# Patient Record
Sex: Female | Born: 1935
Health system: Southern US, Community
[De-identification: ages and names within clinical notes are randomized; demographics above are authoritative.]

## PROBLEM LIST (undated history)

## (undated) DIAGNOSIS — I10 Essential (primary) hypertension: Secondary | ICD-10-CM

## (undated) DIAGNOSIS — M792 Neuralgia and neuritis, unspecified: Secondary | ICD-10-CM

## (undated) DIAGNOSIS — K219 Gastro-esophageal reflux disease without esophagitis: Secondary | ICD-10-CM

## (undated) DIAGNOSIS — I5030 Unspecified diastolic (congestive) heart failure: Secondary | ICD-10-CM

## (undated) DIAGNOSIS — Z9289 Personal history of other medical treatment: Secondary | ICD-10-CM

## (undated) DIAGNOSIS — K2270 Barrett's esophagus without dysplasia: Secondary | ICD-10-CM

## (undated) DIAGNOSIS — I4819 Other persistent atrial fibrillation: Secondary | ICD-10-CM

## (undated) DIAGNOSIS — K859 Acute pancreatitis without necrosis or infection, unspecified: Secondary | ICD-10-CM

## (undated) DIAGNOSIS — E039 Hypothyroidism, unspecified: Secondary | ICD-10-CM

## (undated) DIAGNOSIS — E785 Hyperlipidemia, unspecified: Secondary | ICD-10-CM

## (undated) DIAGNOSIS — F329 Major depressive disorder, single episode, unspecified: Secondary | ICD-10-CM

## (undated) DIAGNOSIS — I872 Venous insufficiency (chronic) (peripheral): Secondary | ICD-10-CM

## (undated) DIAGNOSIS — N184 Chronic kidney disease, stage 4 (severe): Secondary | ICD-10-CM

## (undated) DIAGNOSIS — K831 Obstruction of bile duct: Secondary | ICD-10-CM

## (undated) DIAGNOSIS — I340 Nonrheumatic mitral (valve) insufficiency: Secondary | ICD-10-CM

## (undated) DIAGNOSIS — K5790 Diverticulosis of intestine, part unspecified, without perforation or abscess without bleeding: Secondary | ICD-10-CM

## (undated) DIAGNOSIS — F32A Depression, unspecified: Secondary | ICD-10-CM

## (undated) HISTORY — DX: Chronic kidney disease, stage 4 (severe): N18.4

## (undated) HISTORY — PX: CATARACT EXTRACTION: SUR2

## (undated) HISTORY — DX: Barrett's esophagus without dysplasia: K22.70

## (undated) HISTORY — DX: Major depressive disorder, single episode, unspecified: F32.9

## (undated) HISTORY — DX: Venous insufficiency (chronic) (peripheral): I87.2

## (undated) HISTORY — PX: VESICOVAGINAL FISTULA CLOSURE W/ TAH: SUR271

## (undated) HISTORY — PX: TONSILLECTOMY: SUR1361

## (undated) HISTORY — DX: Neuralgia and neuritis, unspecified: M79.2

## (undated) HISTORY — DX: Essential (primary) hypertension: I10

## (undated) HISTORY — PX: ABDOMINAL HYSTERECTOMY: SHX81

## (undated) HISTORY — DX: Obstruction of bile duct: K83.1

## (undated) HISTORY — DX: Nonrheumatic mitral (valve) insufficiency: I34.0

## (undated) HISTORY — PX: VENTRAL HERNIA REPAIR: SHX424

## (undated) HISTORY — DX: Depression, unspecified: F32.A

## (undated) HISTORY — DX: Acute pancreatitis without necrosis or infection, unspecified: K85.90

## (undated) HISTORY — DX: Diverticulosis of intestine, part unspecified, without perforation or abscess without bleeding: K57.90

## (undated) HISTORY — DX: Hypothyroidism, unspecified: E03.9

## (undated) HISTORY — DX: Gastro-esophageal reflux disease without esophagitis: K21.9

## (undated) HISTORY — DX: Hyperlipidemia, unspecified: E78.5

## (undated) HISTORY — DX: Other persistent atrial fibrillation: I48.19

---

## 2004-11-24 ENCOUNTER — Ambulatory Visit: Payer: Self-pay | Admitting: Unknown Physician Specialty

## 2005-03-30 ENCOUNTER — Ambulatory Visit: Payer: Self-pay | Admitting: Unknown Physician Specialty

## 2005-08-08 HISTORY — PX: VENTRAL HERNIA REPAIR: SHX424

## 2005-10-28 ENCOUNTER — Ambulatory Visit: Payer: Self-pay | Admitting: Unknown Physician Specialty

## 2005-11-03 ENCOUNTER — Ambulatory Visit: Payer: Self-pay | Admitting: Unknown Physician Specialty

## 2006-05-08 ENCOUNTER — Ambulatory Visit: Payer: Self-pay | Admitting: Unknown Physician Specialty

## 2006-05-15 ENCOUNTER — Ambulatory Visit: Payer: Self-pay | Admitting: Surgery

## 2006-05-22 ENCOUNTER — Ambulatory Visit: Payer: Self-pay | Admitting: Surgery

## 2006-08-08 HISTORY — PX: CATARACT EXTRACTION: SUR2

## 2007-03-01 ENCOUNTER — Ambulatory Visit: Payer: Self-pay | Admitting: Unknown Physician Specialty

## 2007-07-23 ENCOUNTER — Other Ambulatory Visit: Payer: Self-pay

## 2007-07-23 ENCOUNTER — Ambulatory Visit: Payer: Self-pay | Admitting: Ophthalmology

## 2007-08-06 ENCOUNTER — Ambulatory Visit: Payer: Self-pay | Admitting: Ophthalmology

## 2007-09-18 ENCOUNTER — Ambulatory Visit: Payer: Self-pay | Admitting: Unknown Physician Specialty

## 2008-05-28 ENCOUNTER — Ambulatory Visit: Payer: Self-pay | Admitting: Unknown Physician Specialty

## 2008-08-08 HISTORY — PX: CHOLECYSTECTOMY: SHX55

## 2008-09-08 HISTORY — PX: CHOLECYSTECTOMY: SHX55

## 2008-09-30 ENCOUNTER — Inpatient Hospital Stay: Payer: Self-pay | Admitting: Surgery

## 2008-10-06 DIAGNOSIS — K831 Obstruction of bile duct: Secondary | ICD-10-CM

## 2008-10-06 HISTORY — DX: Obstruction of bile duct: K83.1

## 2008-11-11 ENCOUNTER — Ambulatory Visit: Payer: Self-pay | Admitting: Unknown Physician Specialty

## 2009-12-08 ENCOUNTER — Ambulatory Visit: Payer: Self-pay | Admitting: Unknown Physician Specialty

## 2010-11-04 ENCOUNTER — Encounter: Payer: Self-pay | Admitting: Internal Medicine

## 2010-11-04 DIAGNOSIS — K219 Gastro-esophageal reflux disease without esophagitis: Secondary | ICD-10-CM | POA: Insufficient documentation

## 2010-11-04 DIAGNOSIS — J45901 Unspecified asthma with (acute) exacerbation: Secondary | ICD-10-CM | POA: Insufficient documentation

## 2010-11-04 DIAGNOSIS — R0602 Shortness of breath: Secondary | ICD-10-CM

## 2010-11-04 DIAGNOSIS — I10 Essential (primary) hypertension: Secondary | ICD-10-CM | POA: Insufficient documentation

## 2010-11-05 ENCOUNTER — Encounter: Payer: Self-pay | Admitting: Internal Medicine

## 2010-11-05 ENCOUNTER — Ambulatory Visit (INDEPENDENT_AMBULATORY_CARE_PROVIDER_SITE_OTHER): Payer: Medicare Other | Admitting: Internal Medicine

## 2010-11-05 ENCOUNTER — Institutional Professional Consult (permissible substitution): Payer: Self-pay | Admitting: Internal Medicine

## 2010-11-05 DIAGNOSIS — N184 Chronic kidney disease, stage 4 (severe): Secondary | ICD-10-CM | POA: Insufficient documentation

## 2010-11-05 DIAGNOSIS — F321 Major depressive disorder, single episode, moderate: Secondary | ICD-10-CM | POA: Insufficient documentation

## 2010-11-05 DIAGNOSIS — K219 Gastro-esophageal reflux disease without esophagitis: Secondary | ICD-10-CM

## 2010-11-05 DIAGNOSIS — I872 Venous insufficiency (chronic) (peripheral): Secondary | ICD-10-CM | POA: Insufficient documentation

## 2010-11-05 DIAGNOSIS — I5022 Chronic systolic (congestive) heart failure: Secondary | ICD-10-CM

## 2010-11-05 DIAGNOSIS — K2270 Barrett's esophagus without dysplasia: Secondary | ICD-10-CM | POA: Insufficient documentation

## 2010-11-05 DIAGNOSIS — F329 Major depressive disorder, single episode, unspecified: Secondary | ICD-10-CM

## 2010-11-05 DIAGNOSIS — J45909 Unspecified asthma, uncomplicated: Secondary | ICD-10-CM

## 2010-11-05 DIAGNOSIS — N289 Disorder of kidney and ureter, unspecified: Secondary | ICD-10-CM

## 2010-11-05 DIAGNOSIS — J42 Unspecified chronic bronchitis: Secondary | ICD-10-CM | POA: Insufficient documentation

## 2010-11-05 DIAGNOSIS — I10 Essential (primary) hypertension: Secondary | ICD-10-CM | POA: Insufficient documentation

## 2010-11-05 MED ORDER — NEBIVOLOL HCL 20 MG PO TABS: 1.0000 | ORAL_TABLET | Freq: Every day | ORAL | Status: DC

## 2010-11-05 MED ORDER — MOMETASONE FURO-FORMOTEROL FUM 100-5 MCG/ACT IN AERO: 2.0000 | INHALATION_SPRAY | Freq: Two times a day (BID) | RESPIRATORY_TRACT | Status: DC

## 2010-11-05 NOTE — Progress Notes (Signed)
  Subjective:    Patient ID: Savannah Wilson, female    DOB: [birth year], 77 y.o.   MRN: [id number]  HPI  31 yowf never smoked with new onset breathing problems dating to 2010 at baseline walk flat nl paces for 30 min at a home  11/05/2010 ov cc recurrent bronchitis x sev decades,  Assoc with difficulty breathing and transient need for inhalers then Dec 2011 on trip to [facility] by bus developed persistent doe at > slow adls so started on advair and proaire with only a little improvement,  Assoc with hoarseness, mostly dry cough.  Has chronic leg swelling no correlation with cough.  proaire works best but hfa < 10% in terms of technique .  Mild assoc nasal congestion, sense of pnd but no sign sputum production or rhinorhea.   Pt denies any significant sore throat, dysphagia, itching, sneezing,  nasal congestion or excess/ purulent secretions,  fever, chills, sweats, unintended wt loss, pleuritic or exertional cp, hempoptysis, orthopnea pnd .  Has leg swelling but no correlation with sob  Also denies any obvious fluctuation of symptoms with weather or environmental changes or other aggravating or alleviating factors.     Review of Systems  Constitutional: Negative for fever, chills and unexpected weight change.  HENT: Positive for congestion, sneezing and postnasal drip. Negative for ear pain, nosebleeds, sore throat, rhinorrhea, trouble swallowing, dental problem, voice change and sinus pressure.   Eyes: Negative for visual disturbance.  Respiratory: Positive for cough and shortness of breath. Negative for choking.   Cardiovascular: Positive for leg swelling. Negative for chest pain.  Gastrointestinal: Negative for vomiting, abdominal pain and diarrhea.  Genitourinary: Negative for difficulty urinating.  Musculoskeletal: Negative for arthralgias.  Skin: Negative for rash.  Neurological: Negative for tremors, syncope and headaches.  Hematological: Bruises/bleeds easily.       Objective:   Physical Exam    Pleasant amb wf nad  Wt 185  11/05/10  HEENT: nl dentition, turbinates, and orophanx. Nl external ear canals without cough reflex   NECK :  without JVD/Nodes/TM/ nl carotid upstrokes bilaterally   LUNGS: no acc muscle use, clear to A and P bilaterally without cough on insp or exp maneuvers   CV:  RRR  no s3 or murmur or increase in P2, no edema   ABD:  soft and nontender with nl excursion in the supine position. No bruits or organomegaly, bowel sounds nl  MS:  warm without deformities, calf tenderness, cyanosis or clubbing  SKIN: warm and dry without lesions    NEURO:  alert, approp, no deficits      Assessment & Plan:

## 2010-11-05 NOTE — Patient Instructions (Signed)
Stop advair and metaprolol  Start dulera 100  Take 2 puffs first thing in am and then another 2 puffs about 12 hours later.     Start  Bystolic 20 mg one each am (instead of the place of two metaprolol)  Protonix Take 30- 60 min before your first and last meals of the day   Work on inhaler technique:  relax and gently blow all the way out then take a nice smooth deep breath back in, triggering the inhaler at same time you start breathing in.  Hold for up to 5 seconds if you can.  Rinse and gargle with water when done   If your mouth or throat starts to bother you,   I suggest you time the inhaler to your dental care and after using the inhaler(s)    brush teeth and tongue with a baking soda containing toothpaste and when you rinse this out, gargle with it first to see if this helps your mouth and throat.     GERD (REFLUX)  is an extremely common cause of respiratory symptoms, many times with no significant heartburn at all.    It can be treated with medication, but also with lifestyle changes including avoidance of late meals, excessive alcohol, smoking cessation, and avoid fatty foods, chocolate, peppermint, colas, red wine, and acidic juices such as orange juice.  NO MINT OR MENTHOL PRODUCTS SO NO COUGH DROPS  USE SUGARLESS CANDY INSTEAD (jolley ranchers or Stover's)  NO OIL BASED VITAMINS   Please schedule a follow up office visit in 4 weeks, sooner if needed

## 2010-11-06 DIAGNOSIS — I5022 Chronic systolic (congestive) heart failure: Secondary | ICD-10-CM | POA: Insufficient documentation

## 2010-11-06 NOTE — Assessment & Plan Note (Addendum)
The differential diagnosis of difficult to control airways disorders is extensive with no quick and easy answers but easy to remember because it consists of 11 A's,  Two Bs and one C: 1. In this case Adherence is the biggest issue and starts with  inability to use HFA effectively and also  understand that SABA treats the symptoms but doesn't get to the underlying problem (inflammation).  I used  the analogy of putting steroid cream on a rash to help explain the meaning of topical therapy and the need to get the drug to the target tissue.  The proper method of use, as well as anticipated side effects, of this metered-dose inhaler are discussed and demonstrated to the patient. Improved to 25% only. 2. Acid reflux disease, with the greater proportion of pulmonary patients with no overt heartburn symptoms, and no easy way to treat non-acid reflux 3. Ace inhibitor use,  >not relevant here  4. Active sinus dz, best addressed by a sinus ct 5. Active smoking,  >not relevant here  6. Allergic diseases, usually with a hx dating back to childhood with prominent allergic rhinitis features in up 90% of pts 7. Aspiration, a perennial problem in the elderly or other patients at risk 8. 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 Alpha one Antitrypsin deficiency, a must screen in patients with chronic airflow obstruction syndromes out of proportion to smoking history. 10. Adverse effect of inhalers, especially DPI's and especially with poor inhaler technique  > def a concern here, try off 11 Anxiety, always a diagnosis of exclusion Two B's 1. Bronchiectasis:  Pos CT is the sine que non here 2  Beta blocker effects:  Coreg and Timolol use are pervasive in the adult population and both have significant spillover effects on the airways. Even metaprolol in high doses can destabilize the airways so try change for now to bystolic (see CHF) One C 1. Congestive heart failure, nicely  ruled out now with BNP level of < 100 when symptomatic - see sep problem  For now work on inhaler technique and rx with ppi/ diet.

## 2010-11-06 NOTE — Assessment & Plan Note (Addendum)
Already on rx with pitting edema noted today but no crackles on inspiration, so ok to continue to titrate lasix as per previous instructions   Strongly prefer in this setting: Bystolic, the most beta -1  selective Beta blocker available in sample form, with bisoprolol the most selective generic choice  on the market, at least until establish baseline function and inhaler depedence (none chronically prior to Dec 2011)

## 2010-12-01 ENCOUNTER — Encounter: Payer: Self-pay | Admitting: Internal Medicine

## 2010-12-03 ENCOUNTER — Ambulatory Visit (INDEPENDENT_AMBULATORY_CARE_PROVIDER_SITE_OTHER): Payer: Medicare Other | Admitting: Internal Medicine

## 2010-12-03 ENCOUNTER — Encounter: Payer: Self-pay | Admitting: Internal Medicine

## 2010-12-03 DIAGNOSIS — J45909 Unspecified asthma, uncomplicated: Secondary | ICD-10-CM

## 2010-12-03 NOTE — Patient Instructions (Addendum)
Start dulera 100  Take 2 puffs first thing in am and then another 2 puffs about 12 hours later.    Protonix Pantoprazole) Take 30- 60 min before your first and last meals of the day   Work on perfecting inhaler technique:  relax and gently blow all the way out then take a nice smooth deep breath back in, triggering the inhaler at same time you start breathing in.  Hold for up to 5 seconds if you can.  Rinse and gargle with water when done   If your mouth or throat starts to bother you,   I suggest you time the inhaler to your dental care and after using the inhaler(s)    brush teeth and tongue with a baking soda containing toothpaste and when you rinse this out, gargle with it first to see if this helps your mouth and throat.     GERD (REFLUX)  is an extremely common cause of respiratory symptoms, many times with no significant heartburn at all.    It can be treated with medication, but also with lifestyle changes including avoidance of late meals, excessive alcohol, smoking cessation, and avoid fatty foods, chocolate, peppermint, colas, red wine, and acidic juices such as orange juice.  NO MINT OR MENTHOL PRODUCTS SO NO COUGH DROPS  USE SUGARLESS CANDY INSTEAD (jolley ranchers or Stover's)  NO OIL BASED VITAMINS   I don't believe your blood pressure pills are affecting your breathing so all blood pressure medications per Dr Gorden Harms  Please schedule a follow up office visit in 4 weeks, sooner if needed with PFT's on return   Think of your medications in 2 separate categories and keep them separate at home:    1)  The ones you take no matter what daily on a scheduled basis.   These are the ones you put in pill boxes and take until a doctor adjusts them for you.  They are designed to help prevent problems or treat the cause of the problem.  They won't necessarily  make you feel immediately better so it's not possible for you to adjust them on your own.  2)  The ones you only take if needed  for specific problems, stopping them once you feel better but restarting them if your problem returns.    Unlike when you get a prescription for eyeglasses, it's not possible to always walk out of this or any medical office with a perfect prescription that is immediately effective  based on any test that we offer here.    On the contrary, it may take several weeks for the full impact of changes recommened today - hopefully you will respond well.  If not, then we'll adjust your medication on your next visit accordingly, knowing more then than we can possibly know now.

## 2010-12-03 NOTE — Assessment & Plan Note (Addendum)
DDX of  difficult airways managment all start with A and  include Adherence, Ace Inhibitors, Acid Reflux, Active Sinus Disease, Alpha 1 Antitripsin deficiency, Anxiety masquerading as Airways dz,  ABPA,  allergy(esp in young), Aspiration (esp in elderly), Adverse effects of DPI,  Active smokers, plus two Bs  = Bronchiectasis and Beta blocker use..and one C= CHF  In this case Adherence is the biggest issue and starts with  inability to use HFA effectively and also  understand that SABA treats the symptoms but doesn't get to the underlying problem (inflammation).  I used  the analogy of putting steroid cream on a rash to help explain the meaning of topical therapy and the need to get the drug to the target tissue.     Acid reflux may well have been an issue because the  ramp to expected improvement in symptoms from an empiric trial of PPI (and for that matter, worsening, if a chronic effective medication is stopped)  can be measured in weeks, not days, a common misconception because this is not the same as treating heartburn (no immediate cause and effect relationship)  so that response to therapy or lack thereof can be very difficult to assess especially if the patient is not adherent to the treatment plan which includes dietary restrictions.   ? Adverse effects of dpi > ok off advair  but needs f/u pft's since still doe.  The spillover B2 effects of metaprolol must not have been an issue with no improvement p change to bystolic which did not control her bp as well, at least in the dose started.     CHF ? Related to hbp/ cri may be a component here     Each maintenance medication was reviewed in detail including most importantly the difference between maintenance and as needed and under what circumstances the prns are to be used.  Please see instructions for details which were reviewed in writing and the patient given a copy.  See instructions for specific recommendations which were reviewed directly  with the patient who was given a copy with highlighter outlining the key components.

## 2010-12-03 NOTE — Progress Notes (Signed)
  Subjective:    Patient ID: Savannah Wilson, female    DOB: 02/16/1929, 77 y.o.   MRN: [id number]  HPI   Subjective:    Patient ID: Savannah Wilson, female    DOB: 02-20-1929, 77 y.o.   MRN: [id number]  HPI  86 yowf never smoked with new onset breathing problems dating to 2010 but at  baseline walk flat nl paces for 30 min at a home  11/05/2010 ov cc recurrent bronchitis x sev decades,  Assoc with difficulty breathing and transient need for inhalers then Dec 2011 on trip to [facility] by bus developed persistent doe at > slow adls so started on advair and proaire with only a little improvement,  Assoc with hoarseness, mostly dry cough.  Has chronic leg swelling no correlation with cough.  proaire works best but hfa < 10% in terms of technique .  Mild assoc nasal congestion, sense of pnd but no sign sputum production or rhinorhea.  rec change advair to dulera 100  And change metaprolol to bystolic in case of spillowver B2 effect, work on inhaler.   12/03/2010 ov/Zymire Turnbo cc "not better at all"  x 2 weeks p ov  then ha then checked bp 239/101 then [facility] where bystolic stopped restart the metaprolol and add back lasix and breathing got better but still not back to baseline but now able to ex for 20 min. No more cough.  Pt denies any significant sore throat, dysphagia, itching, sneezing,  nasal congestion or excess/ purulent secretions,  fever, chills, sweats, unintended wt loss, pleuritic or exertional cp, hempoptysis, orthopnea pnd or leg swelling.    Also denies any obvious fluctuation of symptoms with weather or environmental changes or other aggravating or alleviating factors.              Objective:   Physical Exam    Pleasant amb wf nad but easily frustrated with details of care    Wt 185  11/05/10  >  181 12/03/2010   HEENT: nl dentition, turbinates, and orophanx. Nl external ear canals without cough reflex   NECK :  without JVD/Nodes/TM/ nl carotid upstrokes  bilaterally   LUNGS: no acc muscle use, clear to A and P bilaterally without cough on insp or exp maneuvers   CV:  RRR  no s3 or murmur or increase in P2, no edema   ABD:  soft and nontender with nl excursion in the supine position. No bruits or organomegaly, bowel sounds nl  MS:  warm without deformities, calf tenderness, cyanosis or clubbing        Assessment & Plan:    Review of Systems     Objective:   Physical Exam        Assessment & Plan:

## 2010-12-06 ENCOUNTER — Ambulatory Visit: Payer: Self-pay | Admitting: Unknown Physician Specialty

## 2011-01-07 ENCOUNTER — Encounter: Payer: Self-pay | Admitting: Internal Medicine

## 2011-01-07 ENCOUNTER — Ambulatory Visit (INDEPENDENT_AMBULATORY_CARE_PROVIDER_SITE_OTHER): Payer: Medicare Other | Admitting: Internal Medicine

## 2011-01-07 DIAGNOSIS — J45909 Unspecified asthma, uncomplicated: Secondary | ICD-10-CM

## 2011-01-07 DIAGNOSIS — J42 Unspecified chronic bronchitis: Secondary | ICD-10-CM

## 2011-01-07 LAB — PULMONARY FUNCTION TEST

## 2011-01-07 MED ORDER — MOMETASONE FURO-FORMOTEROL FUM 100-5 MCG/ACT IN AERO: 2.0000 | INHALATION_SPRAY | Freq: Two times a day (BID) | RESPIRATORY_TRACT | Status: DC

## 2011-01-07 NOTE — Patient Instructions (Signed)
Continue dulera 100 Take 2 puffs first thing in am and then another 2 puffs about 12 hours later.    Work on inhaler technique:  relax and gently blow all the way out then take a nice smooth deep breath back in, triggering the inhaler at same time you start breathing in.  Hold for up to 5 seconds if you can.  Rinse and gargle with water when done   If your mouth or throat starts to bother you,   I suggest you time the inhaler to your dental care and after using the inhaler(s) brush teeth and tongue with a baking soda containing toothpaste and when you rinse this out, gargle with it first to see if this helps your mouth and throat.     Xopenex hfa = Proaire only use it as a rescue inhaler if you can't catch your breath    To get the most out of exercise, you need to be continuously aware that you are short of breath, but never out of breath, for 30 minutes daily. As you improve, it will actually be easier for you to do the same amount of exercise  in  30 minutes so always push to the level where you are short of breath. If doing a treadmill always increase the speed first then the incline to reach your goal of making it harder to breath but never too hard to catch your breath - good luck!   Please schedule a follow up visit in 3 months but call sooner if needed

## 2011-01-07 NOTE — Progress Notes (Signed)
PFT done today. 

## 2011-01-07 NOTE — Progress Notes (Deleted)
  Subjective:    Patient ID: Savannah Wilson, female    DOB: [birth year], 77 y.o.   MRN: [id number]  HPI   Subjective:    Patient ID: Savannah Wilson, female    DOB: 03-07-1929, 77 y.o.   MRN: [id number]  HPI  40 yowf never smoked with new onset breathing problems dating to 2010 but at  baseline walk flat nl paces for 30 min at a home  11/05/2010 ov cc recurrent bronchitis x sev decades,  Assoc with difficulty breathing and transient need for inhalers then Dec 2011 on trip to [facility] by bus developed persistent doe at > slow adls so started on advair and proaire with only a little improvement,  Assoc with hoarseness, mostly dry cough.  Has chronic leg swelling no correlation with cough.  proaire works best but hfa < 10% in terms of technique .  Mild assoc nasal congestion, sense of pnd but no sign sputum production or rhinorhea.  rec change advair to dulera 100  And change metaprolol to bystolic in case of spillowver B2 effect, work on inhaler.   12/03/2010 ov/Tollie Canada cc "not better at all"  x 2 weeks p ov  then ha then checked bp 239/101 then [facility] where bystolic stopped restart the metaprolol and add back lasix and breathing got better but still not back to baseline but now able to ex for 20 min. No more cough.  Pt denies any significant sore throat, dysphagia, itching, sneezing,  nasal congestion or excess/ purulent secretions,  fever, chills, sweats, unintended wt loss, pleuritic or exertional cp, hempoptysis, orthopnea pnd or leg swelling.    Also denies any obvious fluctuation of symptoms with weather or environmental changes or other aggravating or alleviating factors.              Objective:   Physical Exam    Pleasant amb wf nad but easily frustrated with details of care    Wt 185  11/05/10  >  181 12/03/2010   HEENT: nl dentition, turbinates, and orophanx. Nl external ear canals without cough reflex   NECK :  without JVD/Nodes/TM/ nl carotid upstrokes  bilaterally   LUNGS: no acc muscle use, clear to A and P bilaterally without cough on insp or exp maneuvers   CV:  RRR  no s3 or murmur or increase in P2, no edema   ABD:  soft and nontender with nl excursion in the supine position. No bruits or organomegaly, bowel sounds nl  MS:  warm without deformities, calf tenderness, cyanosis or clubbing        Assessment & Plan:    Review of Systems     Objective:   Physical Exam        Assessment & Plan:

## 2011-01-07 NOTE — Progress Notes (Signed)
  Subjective:    Patient ID: Savannah Wilson, female    DOB: 01/26/1929, 77 y.o.   MRN: [id number]  HPI   42 yowf never smoked with new onset breathing problems dating to 2010 but at baseline walk flat nl paces for 30 min at a home   11/05/2010 ov cc recurrent bronchitis x sev decades, Assoc with difficulty breathing and transient need for inhalers then Dec 2011 on trip to [facility] by bus developed persistent doe at > slow adls so started on advair and proaire with only a little improvement, Assoc with hoarseness, mostly dry cough. Has chronic leg swelling no correlation with cough. proaire works best but hfa < 10% in terms of technique . Mild assoc nasal congestion, sense of pnd but no sign sputum production or rhinorhea. rec change advair to dulera 100 And change metaprolol to bystolic in case of spillover B2 effect, work on inhaler technique  12/03/2010 ov/Savannah Wilson cc "not better at all" x 2 weeks p ov then ha then checked bp 239/101 then [facility] where bystolic stopped restart the metaprolol and add back lasix and breathing got better but still not back to baseline but now able to ex for 20 min. No more cough.  rec trial of dulera 100 and max gerd diet/ ppi, ok to leave on metaprolol   01/07/2011 ov/Savannah Wilson all smiles  Cc only sob with steps,  Sleeping ok without nocturnal  or early am exac of resp c/o's or need for noct saba.  No cough. Pt denies any significant sore throat, dysphagia, itching, sneezing,  nasal congestion or excess/ purulent secretions,  fever, chills, sweats, unintended wt loss, pleuritic or exertional cp, hempoptysis, orthopnea pnd or leg swelling.    Also denies any obvious fluctuation of symptoms with weather or environmental changes or other aggravating or alleviating factors.      Review of Systems     Objective:   Physical Exam    Pleasant amb wf nad but easily frustrated with details of care  Wt 185 11/05/10 > 181 12/03/2010  > 178 01/08/2011  HEENT: nl dentition,  turbinates, and orophanx. Nl external ear canals without cough reflex  NECK : without JVD/Nodes/TM/ nl carotid upstrokes bilaterally  LUNGS: no acc muscle use, clear to A and P bilaterally without cough on insp or exp maneuvers  CV: RRR no s3 or murmur or increase in P2, no edema  ABD: soft and nontender with nl excursion in the supine position. No bruits or organomegaly, bowel sounds nl  MS: warm without deformities, calf tenderness, cyanosis or clubbing     Assessment & Plan:

## 2011-01-08 NOTE — Assessment & Plan Note (Signed)
All goals of chronic asthma control met including optimal function and elimination of symptoms with minimal need for rescue therapy.  Contingencies discussed in full including contacting this office immediately if not controlling the symptoms using the rule of two's.     The proper method of use, as well as anticipated side effects, of this metered-dose inhaler are discussed and demonstrated to the patient. This improved to 75% with coaching  She still has doe but strongly suspect this is due to deconditioning based on her pft's today which are essentially normal.  See instructions for specific recommendations which were reviewed directly with the patient who was given a copy with highlighter outlining the key components.

## 2011-01-21 ENCOUNTER — Encounter: Payer: Self-pay | Admitting: Internal Medicine

## 2011-03-02 ENCOUNTER — Ambulatory Visit: Payer: Self-pay | Admitting: Ophthalmology

## 2011-03-02 ENCOUNTER — Ambulatory Visit: Payer: Self-pay | Admitting: Unknown Physician Specialty

## 2011-03-02 LAB — HM MAMMOGRAPHY: HM Mammogram: NORMAL

## 2011-03-14 ENCOUNTER — Ambulatory Visit: Payer: Self-pay | Admitting: Ophthalmology

## 2011-03-21 ENCOUNTER — Ambulatory Visit: Payer: Self-pay | Admitting: Internal Medicine

## 2011-04-06 ENCOUNTER — Ambulatory Visit: Payer: Medicare Other | Admitting: Internal Medicine

## 2011-04-09 ENCOUNTER — Ambulatory Visit: Payer: Self-pay | Admitting: Internal Medicine

## 2011-05-09 ENCOUNTER — Ambulatory Visit: Payer: Self-pay | Admitting: Internal Medicine

## 2011-05-30 ENCOUNTER — Encounter: Payer: Self-pay | Admitting: Internal Medicine

## 2011-08-09 HISTORY — PX: CARPAL TUNNEL RELEASE: SHX101

## 2011-08-12 ENCOUNTER — Ambulatory Visit: Payer: Self-pay | Admitting: Specialist

## 2011-08-12 DIAGNOSIS — I10 Essential (primary) hypertension: Secondary | ICD-10-CM

## 2011-08-12 LAB — POTASSIUM: Potassium: 4.8 mmol/L (ref 3.5–5.1)

## 2011-08-19 ENCOUNTER — Ambulatory Visit: Payer: Self-pay | Admitting: Specialist

## 2011-09-14 ENCOUNTER — Ambulatory Visit (INDEPENDENT_AMBULATORY_CARE_PROVIDER_SITE_OTHER): Payer: Medicare Other | Admitting: Internal Medicine

## 2011-09-14 ENCOUNTER — Encounter: Payer: Self-pay | Admitting: Internal Medicine

## 2011-09-14 DIAGNOSIS — G2581 Restless legs syndrome: Secondary | ICD-10-CM

## 2011-09-14 DIAGNOSIS — I129 Hypertensive chronic kidney disease with stage 1 through stage 4 chronic kidney disease, or unspecified chronic kidney disease: Secondary | ICD-10-CM

## 2011-09-14 DIAGNOSIS — K219 Gastro-esophageal reflux disease without esophagitis: Secondary | ICD-10-CM

## 2011-09-14 DIAGNOSIS — N289 Disorder of kidney and ureter, unspecified: Secondary | ICD-10-CM

## 2011-09-14 DIAGNOSIS — N189 Chronic kidney disease, unspecified: Secondary | ICD-10-CM

## 2011-09-14 DIAGNOSIS — I872 Venous insufficiency (chronic) (peripheral): Secondary | ICD-10-CM

## 2011-09-14 MED ORDER — FUROSEMIDE 40 MG PO TABS: ORAL_TABLET | ORAL | Status: DC

## 2011-09-14 MED ORDER — PRAMIPEXOLE DIHYDROCHLORIDE 0.125 MG PO TABS: 0.1250 mg | ORAL_TABLET | Freq: Every day | ORAL | Status: DC

## 2011-09-14 NOTE — Progress Notes (Signed)
Subjective:    Patient ID: Savannah Wilson, female    DOB: January 18, 1929, 78 y.o.   MRN: [id number]  HPI  Vistaril is a delightful 78 year old white female with a history of diabetes mellitus with peripheral neuropathy and chronic kidney disease stage IV, venous insufficiency hypertension hypothyroidism and hyperlipidemia who presents to establish care. She was referred by her daughter Marisa Cyphers and was previously managed by Dr. Sherald Barge.  Her chief complaint today is lower extremity edema. She was recently seen by Dr.Latiffe, her nephrologist who prescribed a higher dose of amlodipine for management of hypertension with goal 120/70. She was compression stockings on a daily basis in spite of using these however has fluid retention and is uncomfortable and painful. She has no history of venous ulcers.  She has a history of COPD secondary to asthma and has had several episodes of bronchitis this year . Her pulmonologist is Dr. Melvyn Novas.  She is currently asymptomatic.     Review of Systems  Constitutional: Negative for fever, chills and unexpected weight change.  HENT: Negative for hearing loss, ear pain, nosebleeds, congestion, sore throat, facial swelling, rhinorrhea, sneezing, mouth sores, trouble swallowing, neck pain, neck stiffness, voice change, postnasal drip, sinus pressure, tinnitus and ear discharge.   Eyes: Negative for pain, discharge, redness and visual disturbance.  Respiratory: Negative for cough, chest tightness, shortness of breath, wheezing and stridor.   Cardiovascular: Positive for leg swelling. Negative for chest pain and palpitations.  Musculoskeletal: Negative for myalgias and arthralgias.  Skin: Negative for color change and rash.  Neurological: Negative for dizziness, weakness, light-headedness and headaches.  Hematological: Negative for adenopathy.       Objective:   Physical Exam  Constitutional: She is oriented to person, place, and time. She appears well-developed  and well-nourished.  HENT:  Mouth/Throat: Oropharynx is clear and moist.  Eyes: EOM are normal. Pupils are equal, round, and reactive to light. No scleral icterus.  Neck: Normal range of motion. Neck supple. No JVD present. No thyromegaly present.  Cardiovascular: Normal rate, regular rhythm, normal heart sounds and intact distal pulses.   Pulmonary/Chest: Effort normal and breath sounds normal.  Abdominal: Soft. Bowel sounds are normal. She exhibits no mass. There is no tenderness.  Musculoskeletal: Normal range of motion.  Lymphadenopathy:    She has no cervical adenopathy.  Neurological: She is alert and oriented to person, place, and time.  Skin: Skin is warm and dry.  Psychiatric: She has a normal mood and affect.          Assessment & Plan:   Venous insufficiency She has chronic lower extremity edema managed with compression stockings. However her recent addition of amlodipine as aggravated her symptoms. She takes Lasix every other day 20 mg. Will try increasing her Lasix to 40 a day to 40 mg every other day and follow kidney function and electrolytes. Also recommend that she try elevating her legs as much as possible when she is home to encourage drainage. She has no venous ulcers.  GERD (gastroesophageal reflux disease) Not at goal of 120/70 despite maximal dose of amlodipine.  Hypertension, renal disease She is not at goal for management of renal disease despite use of for medications. She is now maxed amlodipine losartan and metoprolol. She also takes hydralazine 25 mg 3 times daily I have increased her Lasix today 40 mg every other day given her concurrent increased lower extremity edema. It may be worthwhile trying her on Micardis her Diovan instead of losartan if he's not  at goal. However Dr. Holley Raring is also managing her heard hypertension so I will defer to him at this time.  Renal insufficiency She has stage IV chronic kidney disease and is managed by Dr. Holley Raring her  creatinine has been stable and her creatinine clearance is around 30.  Restless legs syndrome She has been having difficulty sleeping due to 2 recurrent twitching and dysesthesias of her lower extremities. She does request a trial of medication for restless legs. I have sent a prescription for Mirapex generic  to her pharmacy    Updated Medication List Outpatient Encounter Prescriptions as of 09/14/2011  Medication Sig Dispense Refill  . acetaminophen (TYLENOL) 650 MG CR tablet Take 650 mg by mouth every 8 (eight) hours as needed.      Marland Kitchen albuterol (PROAIR HFA) 108 (90 BASE) MCG/ACT inhaler Inhale 2 puffs into the lungs every 6 (six) hours as needed.        Marland Kitchen amLODipine (NORVASC) 10 MG tablet Take 10 mg by mouth daily.      . Calcium 600-200 MG-UNIT per tablet Take 1 tablet by mouth daily.        . DiphenhydrAMINE HCl (BENADRYL ALLERGY PO) Take by mouth as needed.      . furosemide (LASIX) 40 MG tablet One tablet every other day for fluid retention  30 tablet  1  . hydrALAZINE (APRESOLINE) 25 MG tablet Take 25 mg by mouth 3 (three) times daily.      Marland Kitchen levothyroxine (SYNTHROID, LEVOTHROID) 88 MCG tablet Take 88 mcg by mouth daily.        Marland Kitchen loperamide (IMODIUM A-D) 2 MG tablet Take 2 mg by mouth 4 (four) times daily as needed.        Marland Kitchen losartan (COZAAR) 100 MG tablet Take 100 mg by mouth daily.        . metoprolol (LOPRESSOR) 50 MG tablet Take 50 mg by mouth 2 (two) times daily.        . mometasone-formoterol (DULERA) 100-5 MCG/ACT AERO Inhale 2 puffs into the lungs 2 (two) times daily.      . pantoprazole (PROTONIX) 40 MG tablet Take 40 mg by mouth 2 (two) times daily before a meal.        . sertraline (ZOLOFT) 50 MG tablet Take 50 mg by mouth daily.        Marland Kitchen DISCONTD: furosemide (LASIX) 20 MG tablet Take 20 mg by mouth daily as needed.        . pramipexole (MIRAPEX) 0.125 MG tablet Take 1 tablet (0.125 mg total) by mouth at bedtime.  30 tablet  5  . DISCONTD: aliskiren (TEKTURNA) 300 MG tablet  Take 300 mg by mouth daily.        Marland Kitchen DISCONTD: aliskiren (TEKTURNA) 300 MG tablet Take 300 mg by mouth daily.        Marland Kitchen DISCONTD: ALPRAZolam (XANAX) 0.25 MG tablet Take 0.25 mg by mouth at bedtime as needed.        Marland Kitchen DISCONTD: ALPRAZolam (XANAX) 0.25 MG tablet Take 0.25 mg by mouth 3 (three) times daily as needed.        Marland Kitchen DISCONTD: aspirin 81 MG tablet Take 81 mg by mouth daily.        Marland Kitchen DISCONTD: cholestyramine (QUESTRAN) 4 G packet As directed       . DISCONTD: Cyanocobalamin (VITAMIN B-12) 1000 MCG/15ML LIQD As directed every month      . DISCONTD: febuxostat (ULORIC) 40 MG tablet Take 40 mg by mouth  daily.        Marland Kitchen DISCONTD: febuxostat (ULORIC) 40 MG tablet Take 80 mg by mouth daily.        Marland Kitchen DISCONTD: fluticasone (FLONASE) 50 MCG/ACT nasal spray 2 sprays by Nasal route daily.        Marland Kitchen DISCONTD: Fluticasone-Salmeterol (ADVAIR DISKUS) 250-50 MCG/DOSE AEPB Inhale 1 puff into the lungs every 12 (twelve) hours.        Marland Kitchen DISCONTD: furosemide (LASIX) 20 MG tablet Take 20 mg by mouth daily as needed.        Marland Kitchen DISCONTD: hydrochlorothiazide (,MICROZIDE/HYDRODIURIL,) 12.5 MG capsule Take 12.5 mg by mouth daily.        Marland Kitchen DISCONTD: hydrochlorothiazide 25 MG tablet Take 25 mg by mouth daily.        Marland Kitchen DISCONTD: HYDROcodone-acetaminophen (VICODIN) 5-500 MG per tablet Take 1 tablet by mouth every 6 (six) hours as needed.        Marland Kitchen DISCONTD: levothyroxine (SYNTHROID, LEVOTHROID) 88 MCG tablet Take 88 mcg by mouth daily.        Marland Kitchen DISCONTD: meloxicam (MOBIC) 15 MG tablet Take 15 mg by mouth daily as needed.        Marland Kitchen DISCONTD: metoprolol (LOPRESSOR) 50 MG tablet Take 50 mg by mouth 2 (two) times daily.        Marland Kitchen DISCONTD: Mometasone Furo-Formoterol Fum (DULERA) 100-5 MCG/ACT AERO Inhale 2 puffs into the lungs 2 (two) times daily.  1 Inhaler  11  . DISCONTD: pantoprazole (PROTONIX) 40 MG tablet Take 40 mg by mouth 2 (two) times daily before a meal.        . DISCONTD: sertraline (ZOLOFT) 50 MG tablet Take 50 mg by  mouth daily.

## 2011-09-14 NOTE — Patient Instructions (Addendum)
Take 40 mg of Lasix  Every other day to see if your swelling  improves.   Put your compression panty hose on before you have gotten out of bed in the morning and take them off before you go to bed.   Elevating your ankles an inch or two above your knees to help drain the fluid.   We are starting a medication for restless legs.  Take it after dinner.

## 2011-09-15 ENCOUNTER — Encounter: Payer: Self-pay | Admitting: Internal Medicine

## 2011-09-15 DIAGNOSIS — I10 Essential (primary) hypertension: Secondary | ICD-10-CM | POA: Insufficient documentation

## 2011-09-15 DIAGNOSIS — G2581 Restless legs syndrome: Secondary | ICD-10-CM | POA: Insufficient documentation

## 2011-09-15 NOTE — Assessment & Plan Note (Signed)
She is not at goal for management of renal disease despite use of for medications. She is now maxed amlodipine losartan and metoprolol. She also takes hydralazine 25 mg 3 times daily I have increased her Lasix today 40 mg every other day given her concurrent increased lower extremity edema. It may be worthwhile trying her on Micardis her Diovan instead of losartan if he's not at goal. However Dr. Holley Raring is also managing her heard hypertension so I will defer to him at this time.

## 2011-09-15 NOTE — Assessment & Plan Note (Signed)
Not at goal of 120/70 despite maximal dose of amlodipine.

## 2011-09-15 NOTE — Assessment & Plan Note (Signed)
She has chronic lower extremity edema managed with compression stockings. However her recent addition of amlodipine as aggravated her symptoms. She takes Lasix every other day 20 mg. Will try increasing her Lasix to 40 a day to 40 mg every other day and follow kidney function and electrolytes. Also recommend that she try elevating her legs as much as possible when she is home to encourage drainage. She has no venous ulcers.

## 2011-09-15 NOTE — Assessment & Plan Note (Signed)
She has been having difficulty sleeping due to 2 recurrent twitching and dysesthesias of her lower extremities. She does request a trial of medication for restless legs. I have sent a prescription for Mirapex generic  to her pharmacy

## 2011-09-15 NOTE — Assessment & Plan Note (Signed)
She has stage IV chronic kidney disease and is managed by Dr. Holley Raring her creatinine has been stable and her creatinine clearance is around 30.

## 2011-09-19 ENCOUNTER — Ambulatory Visit: Payer: Medicare Other | Admitting: Internal Medicine

## 2011-10-05 ENCOUNTER — Ambulatory Visit: Payer: Self-pay | Admitting: Specialist

## 2011-11-08 ENCOUNTER — Ambulatory Visit: Payer: Self-pay | Admitting: Specialist

## 2011-11-10 ENCOUNTER — Other Ambulatory Visit: Payer: Self-pay | Admitting: Internal Medicine

## 2011-11-10 MED ORDER — METOPROLOL TARTRATE 50 MG PO TABS: 50.0000 mg | ORAL_TABLET | Freq: Two times a day (BID) | ORAL | Status: DC

## 2011-11-15 ENCOUNTER — Ambulatory Visit: Payer: Self-pay | Admitting: Specialist

## 2011-11-15 ENCOUNTER — Other Ambulatory Visit: Payer: Self-pay | Admitting: Internal Medicine

## 2011-11-15 MED ORDER — LEVOTHYROXINE SODIUM 88 MCG PO TABS: 88.0000 ug | ORAL_TABLET | Freq: Every day | ORAL | Status: DC

## 2011-11-28 ENCOUNTER — Other Ambulatory Visit: Payer: Self-pay | Admitting: Internal Medicine

## 2011-11-28 MED ORDER — FUROSEMIDE 40 MG PO TABS: ORAL_TABLET | ORAL | Status: DC

## 2011-12-08 ENCOUNTER — Telehealth: Payer: Self-pay | Admitting: Internal Medicine

## 2011-12-08 NOTE — Telephone Encounter (Signed)
Caller: Grae/Patient; PCP: Deborra Medina; CB#: [phone];; Call regarding L shin Pain; L shin pain onset last PM. Pain is more with touch, skin clear, no known trauma. Pt walked quite a bit on 12/07/11, R knee surgery x 3 wks ago. Homecare advised per Leg Non-Injury Protocol. Call back parameters reviewed.

## 2011-12-12 ENCOUNTER — Telehealth: Payer: Self-pay | Admitting: Internal Medicine

## 2011-12-12 NOTE — Telephone Encounter (Signed)
Caller: [place]/Patient; PCP: Deborra Medina; CB#: [phone]; Call regarding Leg Pain; Cont'd L shin pain onset 12/07/11. Pain has not increased but has not gone away. L foot is swelling but this is nl for her. Swelling goes down at night. No s/s of infection. She has an appt for 12/13/11. To keep her appt. Leg Non-Injury Protocol. To call back if sx worsen.

## 2011-12-13 ENCOUNTER — Ambulatory Visit: Payer: Self-pay | Admitting: Internal Medicine

## 2011-12-13 ENCOUNTER — Other Ambulatory Visit: Payer: Self-pay | Admitting: Internal Medicine

## 2011-12-13 ENCOUNTER — Encounter: Payer: Self-pay | Admitting: Internal Medicine

## 2011-12-13 ENCOUNTER — Ambulatory Visit (INDEPENDENT_AMBULATORY_CARE_PROVIDER_SITE_OTHER): Payer: Medicare Other | Admitting: Internal Medicine

## 2011-12-13 VITALS — BP 126/58 | HR 52 | Temp 98.0°F | Resp 18 | Wt 181.5 lb

## 2011-12-13 DIAGNOSIS — E538 Deficiency of other specified B group vitamins: Secondary | ICD-10-CM

## 2011-12-13 DIAGNOSIS — I872 Venous insufficiency (chronic) (peripheral): Secondary | ICD-10-CM

## 2011-12-13 DIAGNOSIS — M79606 Pain in leg, unspecified: Secondary | ICD-10-CM

## 2011-12-13 DIAGNOSIS — R0602 Shortness of breath: Secondary | ICD-10-CM

## 2011-12-13 DIAGNOSIS — M79609 Pain in unspecified limb: Secondary | ICD-10-CM

## 2011-12-13 MED ORDER — ERGOCALCIFEROL 1.25 MG (50000 UT) PO CAPS: 50000.0000 [IU] | ORAL_CAPSULE | ORAL | Status: DC

## 2011-12-13 MED ORDER — TRAMADOL HCL 50 MG PO TABS: 50.0000 mg | ORAL_TABLET | Freq: Four times a day (QID) | ORAL | Status: DC | PRN

## 2011-12-13 MED ORDER — LOSARTAN POTASSIUM 100 MG PO TABS: 100.0000 mg | ORAL_TABLET | Freq: Every day | ORAL | Status: DC

## 2011-12-13 MED ORDER — CYANOCOBALAMIN 1000 MCG/ML IJ SOLN
1000.0000 ug | Freq: Once | INTRAMUSCULAR | Status: AC
  Administered 2011-12-13: 1000 ug via INTRAMUSCULAR

## 2011-12-13 NOTE — Progress Notes (Signed)
Patient ID: Savannah Wilson, female   DOB: 1929/02/04, 78 y.o.   MRN: [id number]  Patient Active Problem List  Diagnoses  . Asthma  . Depression  . GERD (gastroesophageal reflux disease)  . Venous insufficiency  . Barrett esophagus  . Renal insufficiency  . Systolic CHF, chronic  . SOB (shortness of breath)  . Hypertension, renal disease  . Restless legs syndrome  . Lower extremity pain, anterior    Subjective:  CC:   Chief Complaint  Patient presents with  . Follow-up  . Leg Swelling    HPI:   Savannah Wilson a 78 y.o. female who presents  With subacute onset of Leg swelling and pain.  She underwent right knee arthroscopy 4 weeks ago by [name] and was ambulating the next day .  She has had increased swelling in both lower extreemites since the surgery,  but the left leg is particularly swollen and tender to palpation anteriorly.  She has a history of chronic venous insufficiency and CKD and her furosemide was increased to 40 mg daily back in March , preoperatively.  She has been sitting with legs not elevated quite a bit, and has been performing her PT exercises prescribed by ortho at home by herself. .   She has noted some dyspnea which is worse in the mornings and only mild improvement at night and notes that her leg pain is worse at night .  Past Medical History  Diagnosis Date  . Hypothyroid   . Hyperlipidemia   . Diverticulosis   . Peripheral neuralgia   . Hyperkalemia   . Hypertension   . Asthma   . Depression   . GERD (gastroesophageal reflux disease)   . Venous insufficiency   . Barrett esophagus   . Chronic bronchitis   . Diverticular disease   . Renal insufficiency   . Gout     Past Surgical History  Procedure Date  . Ventral hernia repair   . Cataract extraction     right  . Cholecystectomy 02/10  . Vesicovaginal fistula closure w/ tah   . Tonsillectomy   . Ventral hernia repair 2007  . Cataract extraction 2008  . Cholecystectomy 2010  . Carpal  tunnel release jan 2013    [name]         The following portions of the patient's history were reviewed and updated as appropriate: Allergies, current medications, and problem list.    Review of Systems:   12 Pt  review of systems was negative except those addressed in the HPI,     History   Social History  . Marital Status: Widowed    Spouse Name: N/A    Number of Children: 2  . Years of Education: N/A   Occupational History  . 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 Smoking status: Never Smoker   . Smokeless tobacco: Never Used  . Alcohol Use: Yes     rare  . Drug Use: No  . Sexually Active: Not on file   Other Topics Concern  . Not on file   Social History Narrative   ** Merged History Encounter ** Widowed. Has 2 children and lives alone.    Objective:  BP 126/58  Pulse 52  Temp(Src) 98 F (36.7 C) (Oral)  Resp 18  Wt 181 lb 8 oz (82.328 kg)  SpO2 97%  General appearance: alert, cooperative and appears stated age Ears: normal TM's and external ear canals both  ears Throat: lips, mucosa, and tongue normal; teeth and gums normal Neck: no adenopathy, no carotid bruit, supple, symmetrical, trachea midline and thyroid not enlarged, symmetric, no tenderness/mass/nodules Back: symmetric, no curvature. ROM normal. No CVA tenderness. Lungs: clear to auscultation bilaterally Heart: regular rate and rhythm, S1, S2 normal, no murmur, click, rub or gallop Abdomen: soft, non-tender; bowel sounds normal; no masses,  no organomegaly Pulses: 2+ and symmetric Skin: Skin color, texture, turgor normal. No rashes or lesions Lymph nodes: Cervical, supraclavicular, and axillary nodes normal. Ext:  bilateral LE edema,  Left leg worse than right tender anteriorly to palpation.   Assessment and Plan:  SOB (shortness of breath) With a clear lung exam and normal sats,  Likely due to deconditioning.  Given her normal leg ultrasounds,  I am not  concerned about PE .   Venous insufficiency Resume use of stockings .  Continue current furosemide dose.   Lower extremity pain, anterior She was sent for urgent bilateral LE dopplers givne recent surgery and increased edema with pain.,  Dopplers were negative.  Will have her resume daily use of stockings and leg elevation when at rest.     Updated Medication List Outpatient Encounter Prescriptions as of 12/13/2011  Medication Sig Dispense Refill  . acetaminophen (TYLENOL) 650 MG CR tablet Take 650 mg by mouth every 8 (eight) hours as needed.      Marland Kitchen amLODipine (NORVASC) 10 MG tablet Take 10 mg by mouth daily.      . Calcium 600-200 MG-UNIT per tablet Take 1 tablet by mouth daily.        . DiphenhydrAMINE HCl (BENADRYL ALLERGY PO) Take by mouth as needed.      . furosemide (LASIX) 40 MG tablet One tablet every other day for fluid retention  30 tablet  1  . hydrALAZINE (APRESOLINE) 25 MG tablet Take 25 mg by mouth 3 (three) times daily.      Marland Kitchen HYDROcodone-acetaminophen (NORCO) 5-325 MG per tablet Take 1 tablet by mouth every 4 (four) hours as needed.      Marland Kitchen levothyroxine (SYNTHROID, LEVOTHROID) 88 MCG tablet Take 1 tablet (88 mcg total) by mouth daily.  30 tablet  3  . loperamide (IMODIUM A-D) 2 MG tablet Take 2 mg by mouth 4 (four) times daily as needed.        . metoprolol (LOPRESSOR) 50 MG tablet Take 1 tablet (50 mg total) by mouth 2 (two) times daily.  60 tablet  5  . mometasone-formoterol (DULERA) 100-5 MCG/ACT AERO Inhale 2 puffs into the lungs 2 (two) times daily.      . pantoprazole (PROTONIX) 40 MG tablet Take 40 mg by mouth 2 (two) times daily before a meal.        . pramipexole (MIRAPEX) 0.125 MG tablet Take 1 tablet (0.125 mg total) by mouth at bedtime.  30 tablet  5  . sertraline (ZOLOFT) 50 MG tablet Take 50 mg by mouth daily.        . traMADol (ULTRAM) 50 MG tablet Take 1 tablet (50 mg total) by mouth every 6 (six) hours as needed.  120 tablet  2  . DISCONTD: albuterol (PROAIR  HFA) 108 (90 BASE) MCG/ACT inhaler Inhale 2 puffs into the lungs every 6 (six) hours as needed.        Marland Kitchen DISCONTD: losartan (COZAAR) 100 MG tablet Take 100 mg by mouth daily.        Marland Kitchen DISCONTD: traMADol (ULTRAM) 50 MG tablet Take 50 mg by mouth  every 6 (six) hours as needed.      . ergocalciferol (DRISDOL) 50000 UNITS capsule Take 1 capsule (50,000 Units total) by mouth once a week.  4 capsule  3  . cyanocobalamin ((VITAMIN B-12)) injection 1,000 mcg          Orders Placed This Encounter  Procedures  . Lower Extremity Venous Duplex Bilateral    Return in about 3 months (around 03/14/2012).

## 2011-12-13 NOTE — Patient Instructions (Signed)
I am sending you to [facility] for ultrasounds of both legs to rule out blood clots.  If the tests today are normal,  You can resume compression stockings    Take the vitamin D supplement once a week until you see Dr. Kingsley Callander  You can take up to 6 tramadol per day if you need to for pain

## 2011-12-15 ENCOUNTER — Encounter: Payer: Self-pay | Admitting: Internal Medicine

## 2011-12-15 DIAGNOSIS — M79606 Pain in leg, unspecified: Secondary | ICD-10-CM | POA: Insufficient documentation

## 2011-12-15 NOTE — Assessment & Plan Note (Signed)
Resume use of stockings .  Continue current furosemide dose.

## 2011-12-15 NOTE — Assessment & Plan Note (Signed)
She was sent for urgent bilateral LE dopplers givne recent surgery and increased edema with pain.,  Dopplers were negative.  Will have her resume daily use of stockings and leg elevation when at rest.

## 2011-12-15 NOTE — Assessment & Plan Note (Signed)
With a clear lung exam and normal sats,  Likely due to deconditioning.  Given her normal leg ultrasounds,  I am not concerned about PE .

## 2011-12-23 ENCOUNTER — Encounter: Payer: Self-pay | Admitting: Internal Medicine

## 2012-01-24 ENCOUNTER — Ambulatory Visit (INDEPENDENT_AMBULATORY_CARE_PROVIDER_SITE_OTHER): Payer: Medicare Other | Admitting: Internal Medicine

## 2012-01-24 ENCOUNTER — Encounter: Payer: Self-pay | Admitting: Internal Medicine

## 2012-01-24 ENCOUNTER — Telehealth: Payer: Self-pay | Admitting: Internal Medicine

## 2012-01-24 VITALS — BP 130/70 | HR 53 | Temp 98.0°F | Resp 16 | Wt 179.8 lb

## 2012-01-24 DIAGNOSIS — I872 Venous insufficiency (chronic) (peripheral): Secondary | ICD-10-CM

## 2012-01-24 DIAGNOSIS — R229 Localized swelling, mass and lump, unspecified: Secondary | ICD-10-CM

## 2012-01-24 DIAGNOSIS — E538 Deficiency of other specified B group vitamins: Secondary | ICD-10-CM

## 2012-01-24 DIAGNOSIS — R609 Edema, unspecified: Secondary | ICD-10-CM

## 2012-01-24 DIAGNOSIS — M79609 Pain in unspecified limb: Secondary | ICD-10-CM

## 2012-01-24 DIAGNOSIS — R600 Localized edema: Secondary | ICD-10-CM

## 2012-01-24 DIAGNOSIS — R2240 Localized swelling, mass and lump, unspecified lower limb: Secondary | ICD-10-CM

## 2012-01-24 DIAGNOSIS — M79606 Pain in leg, unspecified: Secondary | ICD-10-CM

## 2012-01-24 MED ORDER — LOSARTAN POTASSIUM 100 MG PO TABS: 100.0000 mg | ORAL_TABLET | Freq: Every day | ORAL | Status: DC

## 2012-01-24 MED ORDER — FUROSEMIDE 40 MG PO TABS: ORAL_TABLET | ORAL | Status: DC

## 2012-01-24 MED ORDER — AMLODIPINE BESYLATE 10 MG PO TABS: 10.0000 mg | ORAL_TABLET | Freq: Every day | ORAL | Status: DC

## 2012-01-24 MED ORDER — HYDRALAZINE HCL 25 MG PO TABS: 25.0000 mg | ORAL_TABLET | Freq: Three times a day (TID) | ORAL | Status: DC

## 2012-01-24 MED ORDER — CYANOCOBALAMIN 1000 MCG/ML IJ SOLN
1000.0000 ug | Freq: Once | INTRAMUSCULAR | Status: AC
  Administered 2012-01-24: 1000 ug via INTRAMUSCULAR

## 2012-01-24 MED ORDER — LEVOTHYROXINE SODIUM 88 MCG PO TABS: 88.0000 ug | ORAL_TABLET | Freq: Every day | ORAL | Status: DC

## 2012-01-24 NOTE — Patient Instructions (Addendum)
I am increasing your furosemide to 40 mg twice daily  .  Take second dose by 2 pm  Plain x rays of left leg to evaluate painful bump  Referral to Dr. Lucky Cowboy to evaluate veins and lymph flow.

## 2012-01-24 NOTE — Progress Notes (Signed)
Patient ID: Savannah Wilson, female   DOB: 1929/06/02, 78 y.o.   MRN: [id number]  Patient Active Problem List  Diagnosis  . Asthma  . Depression  . GERD (gastroesophageal reflux disease)  . Venous insufficiency  . Barrett esophagus  . Renal insufficiency  . Systolic CHF, chronic  . SOB (shortness of breath)  . Hypertension, renal disease  . Restless legs syndrome  . Lower extremity pain, anterior    Subjective:  CC:   No chief complaint on file.   HPI:   Savannah Wilson a 78 y.o. female who presents for follow up on leg swelling which occurred post arthroscopy two months ago.  She was seen last month for edema and sent for dopplers which were negative for DVT.  She was advised to resume use of compression stockings and leg elevation , which she has been doing daily.  Taking her furosemide daily as well.  Both legs are tender to palpation.  She has developed a painful lump on the anterior surface of LLE and both legs have developed a milk bottle appearance.    Past Medical History  Diagnosis Date  . Hypothyroid   . Hyperlipidemia   . Diverticulosis   . Peripheral neuralgia   . Hyperkalemia   . Hypertension   . Asthma   . Depression   . GERD (gastroesophageal reflux disease)   . Venous insufficiency   . Barrett esophagus   . Chronic bronchitis   . Diverticular disease   . Renal insufficiency   . Gout     Past Surgical History  Procedure Date  . Ventral hernia repair   . Cataract extraction     right  . Cholecystectomy 02/10  . Vesicovaginal fistula closure w/ tah   . Tonsillectomy   . Ventral hernia repair 2007  . Cataract extraction 2008  . Cholecystectomy 2010  . Carpal tunnel release jan 2013    Savannah Wilson         The following portions of the patient's history were reviewed and updated as appropriate: Allergies, current medications, and problem list.    Review of Systems:   12 Pt  review of systems was negative except those addressed in the  HPI,     History   Social History  . Marital Status: Widowed    Spouse Name: N/A    Number of Children: 2  . Years of Education: N/A   Occupational History  . 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 Smoking status: Never Smoker   . Smokeless tobacco: Never Used  . Alcohol Use: Yes     rare  . Drug Use: No  . Sexually Active: Not on file   Other Topics Concern  . Not on file   Social History Narrative   ** Merged History Encounter ** Widowed. Has 2 children and lives alone.    Objective:  BP 130/70  Pulse 53  Temp 98 F (36.7 C) (Oral)  Resp 16  Wt 179 lb 12 oz (81.534 kg)  SpO2 97%  General appearance: alert, cooperative and appears stated age Ears: normal TM's and external ear canals both ears Throat: lips, mucosa, and tongue normal; teeth and gums normal Neck: no adenopathy, no carotid bruit, supple, symmetrical, trachea midline and thyroid not enlarged, symmetric, no tenderness/mass/nodules Back: symmetric, no curvature. ROM normal. No CVA tenderness. Lungs: clear to auscultation bilaterally Heart: regular rate and rhythm, S1, S2 normal, no murmur, click, rub or gallop Abdomen:  soft, non-tender; bowel sounds normal; no masses,  no organomegaly Pulses: 2+ and symmetric Skin: Skin color, texture to LE waxy, pitting edema No rashes or lesions Lymph nodes: Cervical, supraclavicular, and axillary nodes normal.  Assessment and Plan:  Lower extremity pain, anterior She has a painful non erythematous lump on the anterior surface of left shin.  Plain films ordered to rule out bone mass and soft tissue mass.   Venous insufficiency Not responding to diuretics and compression stockings raising the issue of lymphedema .  Referal to vascular surgery for venous ultrasounds    Updated Medication List Outpatient Encounter Prescriptions as of 01/24/2012  Medication Sig Dispense Refill  . acetaminophen (TYLENOL) 650 MG CR tablet Take 650 mg by mouth  every 8 (eight) hours as needed.      Marland Kitchen amLODipine (NORVASC) 10 MG tablet Take 1 tablet (10 mg total) by mouth daily.  30 tablet  3  . Calcium 600-200 MG-UNIT per tablet Take 1 tablet by mouth daily.        . DiphenhydrAMINE HCl (BENADRYL ALLERGY PO) Take by mouth as needed.      . ergocalciferol (DRISDOL) 50000 UNITS capsule Take 1 capsule (50,000 Units total) by mouth once a week.  4 capsule  3  . furosemide (LASIX) 40 MG tablet One to two tablets daily as needed for fluid retention  80 tablet  2  . hydrALAZINE (APRESOLINE) 25 MG tablet Take 1 tablet (25 mg total) by mouth 3 (three) times daily.  30 tablet  3  . levothyroxine (SYNTHROID, LEVOTHROID) 88 MCG tablet Take 1 tablet (88 mcg total) by mouth daily.  30 tablet  3  . loperamide (IMODIUM A-D) 2 MG tablet Take 2 mg by mouth 4 (four) times daily as needed.        Marland Kitchen losartan (COZAAR) 100 MG tablet Take 1 tablet (100 mg total) by mouth daily.  30 tablet  3  . metoprolol (LOPRESSOR) 50 MG tablet Take 1 tablet (50 mg total) by mouth 2 (two) times daily.  60 tablet  5  . mometasone-formoterol (DULERA) 100-5 MCG/ACT AERO Inhale 2 puffs into the lungs 2 (two) times daily.      . pantoprazole (PROTONIX) 40 MG tablet Take 40 mg by mouth 2 (two) times daily before a meal.        . sertraline (ZOLOFT) 50 MG tablet Take 50 mg by mouth daily.        . traMADol (ULTRAM) 50 MG tablet Take 1 tablet (50 mg total) by mouth every 6 (six) hours as needed.  120 tablet  2  . DISCONTD: amLODipine (NORVASC) 10 MG tablet Take 10 mg by mouth daily.      Marland Kitchen DISCONTD: furosemide (LASIX) 40 MG tablet One tablet every other day for fluid retention  30 tablet  1  . DISCONTD: hydrALAZINE (APRESOLINE) 25 MG tablet Take 25 mg by mouth 3 (three) times daily.      Marland Kitchen DISCONTD: levothyroxine (SYNTHROID, LEVOTHROID) 88 MCG tablet Take 1 tablet (88 mcg total) by mouth daily.  30 tablet  3  . DISCONTD: losartan (COZAAR) 100 MG tablet Take 1 tablet (100 mg total) by mouth daily.  90  tablet  3  . DISCONTD: HYDROcodone-acetaminophen (NORCO) 5-325 MG per tablet Take 1 tablet by mouth every 4 (four) hours as needed.      Marland Kitchen DISCONTD: pramipexole (MIRAPEX) 0.125 MG tablet Take 1 tablet (0.125 mg total) by mouth at bedtime.  30 tablet  5  .  cyanocobalamin ((VITAMIN B-12)) injection 1,000 mcg          Orders Placed This Encounter  Procedures  . DG Tibia/Fibula Left  . Ambulatory referral to Vascular Surgery    No Follow-up on file.

## 2012-01-25 NOTE — Telephone Encounter (Signed)
I have filled out my part on the [facility] referral form.  Once it is signed I will fax.

## 2012-01-25 NOTE — Assessment & Plan Note (Signed)
She has a painful non erythematous lump on the anterior surface of left shin.  Plain films ordered to rule out bone mass and soft tissue mass.

## 2012-01-25 NOTE — Assessment & Plan Note (Signed)
Not responding to diuretics and compression stockings raising the issue of lymphedema .  Referal to vascular surgery for venous ultrasounds

## 2012-01-26 ENCOUNTER — Ambulatory Visit: Payer: Self-pay | Admitting: Internal Medicine

## 2012-01-27 ENCOUNTER — Telehealth: Payer: Self-pay | Admitting: Internal Medicine

## 2012-01-27 NOTE — Telephone Encounter (Signed)
The leg x ray showed nothing wrong with the bone. The swelling is in the subcutaneous tissue .  Nothing to do about them

## 2012-01-27 NOTE — Telephone Encounter (Signed)
Left message notifying patient.

## 2012-01-30 NOTE — Telephone Encounter (Signed)
I have faxed over the [facility] form.

## 2012-02-03 ENCOUNTER — Encounter: Payer: Self-pay | Admitting: Internal Medicine

## 2012-02-29 ENCOUNTER — Ambulatory Visit (INDEPENDENT_AMBULATORY_CARE_PROVIDER_SITE_OTHER): Payer: Medicare Other | Admitting: *Deleted

## 2012-02-29 DIAGNOSIS — D518 Other vitamin B12 deficiency anemias: Secondary | ICD-10-CM

## 2012-02-29 DIAGNOSIS — D519 Vitamin B12 deficiency anemia, unspecified: Secondary | ICD-10-CM

## 2012-02-29 MED ORDER — CYANOCOBALAMIN 1000 MCG/ML IJ SOLN
1000.0000 ug | Freq: Once | INTRAMUSCULAR | Status: AC
  Administered 2012-02-29: 1000 ug via INTRAMUSCULAR

## 2012-03-16 ENCOUNTER — Ambulatory Visit (INDEPENDENT_AMBULATORY_CARE_PROVIDER_SITE_OTHER): Payer: Medicare Other | Admitting: Internal Medicine

## 2012-03-16 ENCOUNTER — Encounter: Payer: Self-pay | Admitting: Internal Medicine

## 2012-03-16 VITALS — BP 118/60 | HR 58 | Temp 98.4°F | Resp 18 | Wt 178.0 lb

## 2012-03-16 DIAGNOSIS — E559 Vitamin D deficiency, unspecified: Secondary | ICD-10-CM

## 2012-03-16 DIAGNOSIS — R42 Dizziness and giddiness: Secondary | ICD-10-CM

## 2012-03-16 DIAGNOSIS — E039 Hypothyroidism, unspecified: Secondary | ICD-10-CM

## 2012-03-16 LAB — CBC WITH DIFFERENTIAL/PLATELET
Basophils Relative: 0 % (ref 0–1)
HCT: 31.5 % — ABNORMAL LOW (ref 36.0–46.0)
Hemoglobin: 10.6 g/dL — ABNORMAL LOW (ref 12.0–15.0)
MCHC: 33.7 g/dL (ref 30.0–36.0)
MCV: 88.7 fL (ref 78.0–100.0)
Monocytes Absolute: 0.4 10*3/uL (ref 0.1–1.0)
Monocytes Relative: 5 % (ref 3–12)
Neutro Abs: 6.2 10*3/uL (ref 1.7–7.7)

## 2012-03-16 NOTE — Patient Instructions (Addendum)
Stop the hydrazaline for now completely,,  Continue your other blood pressure medications   I am ordering you an MRI of the brain to evaluate your recent head injury and your dizziness

## 2012-03-16 NOTE — Progress Notes (Signed)
Patient ID: Savannah Wilson, female   DOB: 01/12/1929, 78 y.o.   MRN: [id number]  Patient Active Problem List  Diagnosis  . Asthma  . Depression  . GERD (gastroesophageal reflux disease)  . Venous insufficiency  . Barrett esophagus  . Renal insufficiency  . Systolic CHF, chronic  . SOB (shortness of breath)  . Hypertension, renal disease  . Restless legs syndrome  . Lower extremity pain, anterior  . Dizziness - light-headed  . Pancreatitis due to biliary obstruction    Subjective:  CC:   Chief Complaint  Patient presents with  . Follow-up    3 month    HPI:   Savannah Wilson a 78 y.o. female who presents with the chief complaint of dizziness .   Her ankle edema has been managed with daily diuretic. However she has been having increased dizziness  wth occasional episdoes of vertigo, worse with rising from bed or seated position.   she hit her head in June while  she was at the beach.  The episode  happened during the night when she got up from bed to go to the bathroom and felt dizzy,. She bent over and lost balance. Hit head on a bookcase.  not sure whether she had any loss of consciousness. She has not been sleeping at night .  she denies headaches or visual changes.   Past Medical History  Diagnosis Date  . Hypothyroid   . Hyperlipidemia   . Diverticulosis   . Peripheral neuralgia   . Hyperkalemia   . Hypertension   . Asthma   . Depression   . GERD (gastroesophageal reflux disease)   . Venous insufficiency   . Barrett esophagus   . Chronic bronchitis   . Diverticular disease   . Renal insufficiency   . Gout   . Pancreatitis due to biliary obstruction March 2010    s/p ERCP sphincterotomy, cholecystectomy    Past Surgical History  Procedure Date  . Ventral hernia repair   . Cataract extraction     right  . Cholecystectomy 02/10  . Vesicovaginal fistula closure w/ tah   . Tonsillectomy   . Ventral hernia repair 2007  . Cataract extraction 2008  .  Cholecystectomy 2010  . Carpal tunnel release jan 2013    [name]         The following portions of the patient's history were reviewed and updated as appropriate: Allergies, current medications, and problem list.    Review of System  A comprehensive ROS was done and positive for dizziness. The rest was negative.     History   Social History  . Marital Status: Widowed    Spouse Name: N/A    Number of Children: 2  . Years of Education: N/A   Occupational History  . 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 Smoking status: Never Smoker   . Smokeless tobacco: Never Used  . Alcohol Use: Yes     rare  . Drug Use: No  . Sexually Active: Not on file   Other Topics Concern  . Not on file   Social History Narrative   ** Merged History Encounter ** Widowed. Has 2 children and lives alone.    Objective:  BP 118/60  Pulse 58  Temp 98.4 F (36.9 C) (Oral)  Resp 18  Wt 178 lb (80.74 kg)  SpO2 97%  General appearance: alert, cooperative and appears stated age Ears: normal TM's and external ear  canals both ears Throat: lips, mucosa, and tongue normal; teeth and gums normal Neck: no adenopathy, no carotid bruit, supple, symmetrical, trachea midline and thyroid not enlarged, symmetric, no tenderness/mass/nodules Back: symmetric, no curvature. ROM normal. No CVA tenderness. Lungs: clear to auscultation bilaterally Heart: regular rate and rhythm, S1, S2 normal, no murmur, click, rub or gallop Abdomen: soft, non-tender; bowel sounds normal; no masses,  no organomegaly Pulses: 2+ and symmetric Skin: Skin color, texture, turgor normal. No rashes or lesions Lymph nodes: Cervical, supraclavicular, and axillary nodes normal. Neuro: Grossly nonfocal. However with eyes closed she does have some proprioceptive loss. Gait is normal.  Assessment and Plan:  Dizziness - light-headed She was not orthostatic by my exam today. Her neurologic exam is normal  without tremor or nystagmus. I recommended that she stop using hydralazine as this maybe contributing to her symptoms. Given her recent head trauma I've also ordered an MRI the brain to rule out a subacute subdural hematoma.   Updated Medication List Outpatient Encounter Prescriptions as of 03/16/2012  Medication Sig Dispense Refill  . acetaminophen (TYLENOL) 650 MG CR tablet Take 650 mg by mouth every 8 (eight) hours as needed.      Marland Kitchen amLODipine (NORVASC) 10 MG tablet Take 1 tablet (10 mg total) by mouth daily.  30 tablet  3  . DiphenhydrAMINE HCl (BENADRYL ALLERGY PO) Take by mouth as needed.      . ergocalciferol (DRISDOL) 50000 UNITS capsule Take 1 capsule (50,000 Units total) by mouth once a week.  4 capsule  3  . furosemide (LASIX) 40 MG tablet One to two tablets daily as needed for fluid retention  80 tablet  2  . hydrALAZINE (APRESOLINE) 25 MG tablet Take 1 tablet (25 mg total) by mouth 3 (three) times daily.  30 tablet  3  . levothyroxine (SYNTHROID, LEVOTHROID) 88 MCG tablet Take 1 tablet (88 mcg total) by mouth daily.  30 tablet  3  . loperamide (IMODIUM A-D) 2 MG tablet Take 2 mg by mouth 4 (four) times daily as needed.        Marland Kitchen losartan (COZAAR) 100 MG tablet Take 1 tablet (100 mg total) by mouth daily.  30 tablet  3  . metoprolol (LOPRESSOR) 50 MG tablet Take 1 tablet (50 mg total) by mouth 2 (two) times daily.  60 tablet  5  . mometasone-formoterol (DULERA) 100-5 MCG/ACT AERO Inhale 2 puffs into the lungs 2 (two) times daily.      . pantoprazole (PROTONIX) 40 MG tablet Take 40 mg by mouth 2 (two) times daily before a meal.        . sertraline (ZOLOFT) 50 MG tablet Take 50 mg by mouth daily.        . traMADol (ULTRAM) 50 MG tablet Take 1 tablet (50 mg total) by mouth every 6 (six) hours as needed.  120 tablet  2  . DISCONTD: Calcium 600-200 MG-UNIT per tablet Take 1 tablet by mouth daily.           Orders Placed This Encounter  Procedures  . MR Brain Wo Contrast  . RPR  . TSH    . COMPLETE METABOLIC PANEL WITH GFR  . Vitamin D 25 hydroxy  . CBC with Differential  . HM COLONOSCOPY    No Follow-up on file.

## 2012-03-17 LAB — COMPLETE METABOLIC PANEL WITH GFR
ALT: 9 U/L (ref 0–35)
AST: 14 U/L (ref 0–37)
Albumin: 3.6 g/dL (ref 3.5–5.2)
Alkaline Phosphatase: 109 U/L (ref 39–117)
BUN: 49 mg/dL — ABNORMAL HIGH (ref 6–23)
Potassium: 4.5 mEq/L (ref 3.5–5.3)
Sodium: 138 mEq/L (ref 135–145)
Total Protein: 6.2 g/dL (ref 6.0–8.3)

## 2012-03-17 LAB — RPR

## 2012-03-17 LAB — TSH: TSH: 3.4 u[IU]/mL (ref 0.350–4.500)

## 2012-03-17 LAB — VITAMIN D 25 HYDROXY (VIT D DEFICIENCY, FRACTURES): Vit D, 25-Hydroxy: 37 ng/mL (ref 30–89)

## 2012-03-18 ENCOUNTER — Encounter: Payer: Self-pay | Admitting: Internal Medicine

## 2012-03-18 DIAGNOSIS — Z8719 Personal history of other diseases of the digestive system: Secondary | ICD-10-CM | POA: Insufficient documentation

## 2012-03-18 NOTE — Assessment & Plan Note (Signed)
She was not orthostatic by my exam today. Her neurologic exam is normal without tremor or nystagmus. I recommended that she stop using hydralazine as this maybe contributing to her symptoms. Given her recent head trauma I've also ordered an MRI the brain to rule out a subacute subdural hematoma.

## 2012-03-26 ENCOUNTER — Other Ambulatory Visit (INDEPENDENT_AMBULATORY_CARE_PROVIDER_SITE_OTHER): Payer: Medicare Other | Admitting: *Deleted

## 2012-03-26 DIAGNOSIS — I10 Essential (primary) hypertension: Secondary | ICD-10-CM

## 2012-03-26 LAB — BASIC METABOLIC PANEL
BUN: 36 mg/dL — ABNORMAL HIGH (ref 6–23)
CO2: 24 mEq/L (ref 19–32)
Glucose, Bld: 135 mg/dL — ABNORMAL HIGH (ref 70–99)
Potassium: 3.9 mEq/L (ref 3.5–5.1)
Sodium: 140 mEq/L (ref 135–145)

## 2012-04-03 ENCOUNTER — Other Ambulatory Visit: Payer: Self-pay | Admitting: *Deleted

## 2012-04-03 MED ORDER — ERGOCALCIFEROL 1.25 MG (50000 UT) PO CAPS: 50000.0000 [IU] | ORAL_CAPSULE | ORAL | Status: AC

## 2012-05-11 ENCOUNTER — Telehealth: Payer: Self-pay | Admitting: Internal Medicine

## 2012-05-11 MED ORDER — METOPROLOL TARTRATE 50 MG PO TABS: 50.0000 mg | ORAL_TABLET | Freq: Two times a day (BID) | ORAL | Status: DC

## 2012-05-11 NOTE — Telephone Encounter (Signed)
Rx sent to [facility].

## 2012-05-11 NOTE — Telephone Encounter (Signed)
Refill request for pantoprazole sodium 40 mg ter #60 Sig: take one tablet by mouth twice daily one hour before meals

## 2012-05-11 NOTE — Telephone Encounter (Signed)
Refill request for metoprolol tartrate 50 mg tab #60 Sig: take one tablet by mouth twice daily

## 2012-05-14 MED ORDER — PANTOPRAZOLE SODIUM 40 MG PO TBEC: 40.0000 mg | DELAYED_RELEASE_TABLET | Freq: Two times a day (BID) | ORAL | Status: DC

## 2012-05-18 ENCOUNTER — Telehealth: Payer: Self-pay | Admitting: Internal Medicine

## 2012-05-18 NOTE — Telephone Encounter (Signed)
Caller: Mel/Patient; Patient Name: Savannah Wilson; PCP: Deborra Medina (Adults only); Best Callback Phone Number: [phone]. She is calling today with elevation of Blood pressure.  Onset Wednesday night 05/16/12.  199/80 with dizziness. She states Dr. Derrel Nip had taken her off her Blood pressure pill Hydralazine 6 weeks ago  but because her pressure was high, she took one Hydralazine 25mg  . Pressure returned to normal.  Then last night, she took her pressure  05/17/12 and it  177/80.  She took another Hydralazine again last night.  Blood pressure 152/60 today. She is not longer feeling dizziness since she has taken the medicaition. Emergent s/sx ruled out per Hypertension Protocol with the exception to '  A sudden elevation in Blood presure (50mmg/hg and recnet change in medication ". Call provider in 8 hours .   THERE ARE NO APPOINTMENTS AVAILABLE.  PLEASE CONTACT PATIENT FOR BLOOD PRESSURE ISSUES . SHE TOOK DISCONTINUED MEDICATION FOR TWO NIGHT TO BRING BLOOD PRESSURE TO NORMAL.

## 2012-05-18 NOTE — Telephone Encounter (Signed)
[name] daughter called in stating that triage nurse advised them to stay home and wait for our call. I spoke with [name] to get information for patient.

## 2012-05-18 NOTE — Telephone Encounter (Signed)
Called patient at [phone] and line was busy.

## 2012-05-21 ENCOUNTER — Ambulatory Visit (INDEPENDENT_AMBULATORY_CARE_PROVIDER_SITE_OTHER): Payer: Medicare Other | Admitting: Internal Medicine

## 2012-05-21 ENCOUNTER — Encounter: Payer: Self-pay | Admitting: Internal Medicine

## 2012-05-21 VITALS — BP 124/68 | HR 58 | Temp 97.6°F | Ht 60.0 in | Wt 179.5 lb

## 2012-05-21 DIAGNOSIS — M79609 Pain in unspecified limb: Secondary | ICD-10-CM

## 2012-05-21 DIAGNOSIS — E538 Deficiency of other specified B group vitamins: Secondary | ICD-10-CM

## 2012-05-21 DIAGNOSIS — R42 Dizziness and giddiness: Secondary | ICD-10-CM

## 2012-05-21 DIAGNOSIS — H8190 Unspecified disorder of vestibular function, unspecified ear: Secondary | ICD-10-CM

## 2012-05-21 DIAGNOSIS — M79606 Pain in leg, unspecified: Secondary | ICD-10-CM

## 2012-05-21 DIAGNOSIS — Z23 Encounter for immunization: Secondary | ICD-10-CM

## 2012-05-21 DIAGNOSIS — G589 Mononeuropathy, unspecified: Secondary | ICD-10-CM

## 2012-05-21 DIAGNOSIS — G629 Polyneuropathy, unspecified: Secondary | ICD-10-CM

## 2012-05-21 DIAGNOSIS — Z9181 History of falling: Secondary | ICD-10-CM

## 2012-05-21 DIAGNOSIS — R296 Repeated falls: Secondary | ICD-10-CM

## 2012-05-21 MED ORDER — CYANOCOBALAMIN 1000 MCG/ML IJ SOLN
1000.0000 ug | Freq: Once | INTRAMUSCULAR | Status: AC
  Administered 2012-05-21: 1000 ug via INTRAMUSCULAR

## 2012-05-21 MED ORDER — TRAMADOL HCL 50 MG PO TABS: 50.0000 mg | ORAL_TABLET | Freq: Four times a day (QID) | ORAL | Status: DC | PRN

## 2012-05-21 MED ORDER — GABAPENTIN 100 MG PO CAPS: 100.0000 mg | ORAL_CAPSULE | Freq: Three times a day (TID) | ORAL | Status: DC

## 2012-05-21 NOTE — Patient Instructions (Signed)
Please take the tramadol every 6 hours starting in the morning for your knee pain.  1 hour before bedtime instead take gabapentin 100 mg to start with,  If no improvement in 1 hour can add tramadol.  After one week,  Increase the gabapentin to 200 mg at bedtime. (2 pills)   Week 3: increase the gabapentin dose to 300 mg if tolerating the 200 mg dose .

## 2012-05-21 NOTE — Progress Notes (Signed)
Patient ID: Savannah Wilson, female   DOB: [birth year], 78 y.o.   MRN: [id number]  Patient Active Problem List  Diagnosis  . Asthma  . Depression  . GERD (gastroesophageal reflux disease)  . Venous insufficiency  . Barrett esophagus  . Renal insufficiency  . Systolic CHF, chronic  . SOB (shortness of breath)  . Hypertension, renal disease  . Restless legs syndrome  . Lower extremity pain, anterior  . Dizziness - light-headed  . Pancreatitis due to biliary obstruction  . Neuropathy    Subjective:  CC:   Chief Complaint  Patient presents with  . Follow-up    HPI:   Savannah Wilson a 78 y.o. female who presents a recent episode of elevated of bp after a dizzy spell which occurred during hair salon treatment when she was getting hair washed. bp that night 199,  Followed by 177 next morning.  Took a prn hydralazine with improvement.  Having recurrent dizziness when supine in bed,  Room actually  Spins,  no visual changes or headaches,  Last fall was in June hit head on a bookcase was seen in August after two more falls.  Never had the MRI done. No prior carotid dopplers or MRI/MRA.  episodes are infrequent,  Not every week.  2)  LEGS ARE ON FIRE AT NIGHT. Chronic in the foot, but now progressing at night . 3) Bilateral knee pain,  Due to OA>  Savannah Wilson did a  Right knee arthroscopy history March 2013 transiently imporved knee pain but now worse    Past Medical History  Diagnosis Date  . Hypothyroid   . Hyperlipidemia   . Diverticulosis   . Peripheral neuralgia   . Hyperkalemia   . Hypertension   . Asthma   . Depression   . GERD (gastroesophageal reflux disease)   . Venous insufficiency   . Barrett esophagus   . Chronic bronchitis   . Diverticular disease   . Renal insufficiency   . Gout   . Pancreatitis due to biliary obstruction March 2010    s/p ERCP sphincterotomy, cholecystectomy    Past Surgical History  Procedure Date  . Ventral hernia repair   . Cataract  extraction     right  . Cholecystectomy 02/10  . Vesicovaginal fistula closure w/ tah   . Tonsillectomy   . Ventral hernia repair 2007  . Cataract extraction 2008  . Cholecystectomy 2010  . Carpal tunnel release jan 2013    Savannah Wilson   Allergies  Allergen Reactions  . Ace Inhibitors Cough  . Benicar (Olmesartan Medoxomil)   . Clarithromycin Other (See Comments)    Blisters in mouth  . Clarithromycin Other (See Comments)    Unknown   . Norvasc (Amlodipine Besylate)   . Levofloxacin Rash  . Zithromax (Azithromycin Dihydrate) Rash    The following portions of the patient's history were reviewed and updated as appropriate: Allergies, current medications, and problem list.    Review of Systems:   12 Pt  review of systems was negative except those addressed in the HPI,     History   Social History  . Marital Status: Widowed    Spouse Name: N/A    Number of Children: 2  . Years of Education: N/A   Occupational History  . 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 Smoking status: Never Smoker   . Smokeless tobacco: Never Used  . Alcohol Use: Yes     rare  .  Drug Use: No  . Sexually Active: Not on file   Other Topics Concern  . Not on file   Social History Narrative   ** Merged History Encounter ** Widowed. Has 2 children and lives alone.    Objective:  BP 124/68  Pulse 58  Temp 97.6 F (36.4 C) (Oral)  Ht 5' (1.524 m)  Wt 179 lb 8 oz (81.421 kg)  BMI 35.06 kg/m2  SpO2 97%  General appearance: alert, cooperative and appears stated age Ears: normal TM's and external ear canals both ears Throat: lips, mucosa, and tongue normal; teeth and gums normal Neck: no adenopathy, no carotid bruit, supple, symmetrical, trachea midline and thyroid not enlarged, symmetric, no tenderness/mass/nodules Back: symmetric, no curvature. ROM normal. No CVA tenderness. Lungs: clear to auscultation bilaterally Heart: regular rate and rhythm, S1, S2 normal,  no murmur, click, rub or gallop Abdomen: soft, non-tender; bowel sounds normal; no masses,  no organomegaly Pulses: 2+ and symmetric Skin: Skin color, texture, turgor normal. No rashes or lesions Lymph nodes: Cervical, supraclavicular, and axillary nodes normal.  Assessment and Plan:  Neuropathy 24 hour urine collection for heavy metals.  Trial of gabapentin   Hypertension, renal disease recnet episodes of uncontrolled hypertension since stopping the hydralazine.  Prn hydralazine. For systolic > Q000111Q.   Lower extremity pain, anterior Secondary to OA and DJD.  continue tramadol.  Light headed She is not orthostatic,   neurologic exam is without tremor or nystagmus,  But has had recent falls and known CAD/PAD.  MRI/MRA brain ordered    Updated Medication List Outpatient Encounter Prescriptions as of 05/21/2012  Medication Sig Dispense Refill  . acetaminophen (TYLENOL) 650 MG CR tablet Take 650 mg by mouth every 8 (eight) hours as needed.      Marland Kitchen amLODipine (NORVASC) 10 MG tablet Take 1 tablet (10 mg total) by mouth daily.  30 tablet  3  . DiphenhydrAMINE HCl (BENADRYL ALLERGY PO) Take by mouth as needed.      . ergocalciferol (DRISDOL) 50000 UNITS capsule Take 1 capsule (50,000 Units total) by mouth once a week.  4 capsule  3  . furosemide (LASIX) 40 MG tablet One to two tablets daily as needed for fluid retention  80 tablet  2  . hydrALAZINE (APRESOLINE) 25 MG tablet Take 1 tablet (25 mg total) by mouth 3 (three) times daily.  30 tablet  3  . levothyroxine (SYNTHROID, LEVOTHROID) 88 MCG tablet Take 1 tablet (88 mcg total) by mouth daily.  30 tablet  3  . loperamide (IMODIUM A-D) 2 MG tablet Take 2 mg by mouth 4 (four) times daily as needed.        Marland Kitchen losartan (COZAAR) 100 MG tablet Take 1 tablet (100 mg total) by mouth daily.  30 tablet  3  . metoprolol (LOPRESSOR) 50 MG tablet Take 1 tablet (50 mg total) by mouth 2 (two) times daily.  60 tablet  5  . mometasone-formoterol (DULERA)  100-5 MCG/ACT AERO Inhale 2 puffs into the lungs 2 (two) times daily.      . pantoprazole (PROTONIX) 40 MG tablet Take 1 tablet (40 mg total) by mouth 2 (two) times daily before a meal.  60 tablet  1  . sertraline (ZOLOFT) 50 MG tablet Take 50 mg by mouth daily.        . traMADol (ULTRAM) 50 MG tablet Take 1 tablet (50 mg total) by mouth every 6 (six) hours as needed.  120 tablet  2  . DISCONTD: traMADol (  ULTRAM) 50 MG tablet Take 1 tablet (50 mg total) by mouth every 6 (six) hours as needed.  120 tablet  2  . gabapentin (NEURONTIN) 100 MG capsule Take 1 capsule (100 mg total) by mouth 3 (three) times daily.  90 capsule  3  . cyanocobalamin ((VITAMIN B-12)) injection 1,000 mcg

## 2012-05-22 ENCOUNTER — Encounter: Payer: Self-pay | Admitting: Internal Medicine

## 2012-05-22 DIAGNOSIS — G629 Polyneuropathy, unspecified: Secondary | ICD-10-CM | POA: Insufficient documentation

## 2012-05-22 DIAGNOSIS — R42 Dizziness and giddiness: Secondary | ICD-10-CM | POA: Insufficient documentation

## 2012-05-22 NOTE — Assessment & Plan Note (Signed)
She is not orthostatic,   neurologic exam is without tremor or nystagmus,  But has had recent falls and known CAD/PAD.  MRI/MRA brain ordered

## 2012-05-22 NOTE — Assessment & Plan Note (Signed)
24 hour urine collection for heavy metals.  Trial of gabapentin

## 2012-05-22 NOTE — Assessment & Plan Note (Addendum)
recnet episodes of uncontrolled hypertension since stopping the hydralazine.  Prn hydralazine. For systolic > Q000111Q.

## 2012-05-22 NOTE — Assessment & Plan Note (Signed)
Secondary to OA and DJD.  continue tramadol.

## 2012-05-24 ENCOUNTER — Telehealth: Payer: Self-pay | Admitting: Internal Medicine

## 2012-05-24 NOTE — Telephone Encounter (Signed)
So that is the test that you wanted patient to have. For some reason when you placed that order it closed the order and never was sent to the work que. I will set this up in the morning for patient.  Thank you

## 2012-05-24 NOTE — Telephone Encounter (Signed)
Patient brought in her urine today and was asking about the test we were suppose to schedule for her she wasn't sure what the test was called she said it was going to check her veins.  Can you please advise and place order/ referral for what she needs.

## 2012-05-24 NOTE — Telephone Encounter (Signed)
MRI/MRA of brain.  Was ordered during her visit.

## 2012-05-25 NOTE — Telephone Encounter (Signed)
Tried calling patient to let her know what was going on and left vm asking her to return my call.

## 2012-05-25 NOTE — Telephone Encounter (Signed)
This has went to [facility], it will close by itself on Wednesday Oct. 23, please call [phone] [employer]. I have faxed over the last two notes on patient to MD reviewer at [phone].  Dr. Derrel Nip please call them.

## 2012-05-28 NOTE — Telephone Encounter (Signed)
Patient is scheduled for MRI/MRA for 06/01/12 at 10:45 patient needs to arrive at 10:00 at the [facility] entrance.  I have left a detailed message on patient's voicemail.

## 2012-05-29 NOTE — Telephone Encounter (Signed)
Pt called back and said she received the message.

## 2012-05-30 LAB — HEAVY METALS SCREEN, URINE: Mercury 24 Hr Urine: <2 mcg/L (ref <21)

## 2012-06-01 ENCOUNTER — Ambulatory Visit: Payer: Self-pay | Admitting: Internal Medicine

## 2012-06-19 ENCOUNTER — Ambulatory Visit (INDEPENDENT_AMBULATORY_CARE_PROVIDER_SITE_OTHER): Payer: Medicare Other | Admitting: Internal Medicine

## 2012-06-19 ENCOUNTER — Encounter: Payer: Self-pay | Admitting: Internal Medicine

## 2012-06-19 VITALS — BP 132/70 | HR 61 | Temp 98.1°F | Resp 12 | Ht 60.0 in | Wt 181.0 lb

## 2012-06-19 DIAGNOSIS — R42 Dizziness and giddiness: Secondary | ICD-10-CM

## 2012-06-19 DIAGNOSIS — H8190 Unspecified disorder of vestibular function, unspecified ear: Secondary | ICD-10-CM

## 2012-06-19 NOTE — Progress Notes (Signed)
Patient ID: Savannah Wilson, female   DOB: [birth year], 78 y.o.   MRN: [id number]    Patient Active Problem List  Diagnosis  . Asthma  . Depression  . GERD (gastroesophageal reflux disease)  . Venous insufficiency  . Barrett esophagus  . Renal insufficiency  . Systolic CHF, chronic  . SOB (shortness of breath)  . Hypertension, renal disease  . Restless legs syndrome  . Lower extremity pain, anterior  . Light headed  . Pancreatitis due to biliary obstruction  . Neuropathy  . Vestibular dizziness    Subjective:  CC:   Chief Complaint  Patient presents with  . Follow-up    HPI:   Savannah Wilson a 78 y.o. female who presents for one month followup on symptoms of recurrent vertigo. She continues to have episodes that are provoked by sudden changes in position and manipulation of her neck. She had an episode while getting hair washed last Friday, symptoms lasted several hours before resolving spontaneously. However they recurred after supper.  she has second episode at work by bending over.  she has not tried anything over-the-counter for management of symptoms. She did try Dramamine several years ago. She does take chronic back convex to make her drowsy but these have not been changed recently. She does have right ureter cerumen impaction. She had an MRI MRA recently for evaluation of symptoms and there were no signs of strokes or significant vascular disease.    Past Medical History  Diagnosis Date  . Hypothyroid   . Hyperlipidemia   . Diverticulosis   . Peripheral neuralgia   . Hyperkalemia   . Hypertension   . Asthma   . Depression   . GERD (gastroesophageal reflux disease)   . Venous insufficiency   . Barrett esophagus   . Chronic bronchitis   . Diverticular disease   . Renal insufficiency   . Gout   . Pancreatitis due to biliary obstruction March 2010    s/p ERCP sphincterotomy, cholecystectomy    Past Surgical History  Procedure Date  . Ventral hernia repair   .  Cataract extraction     right  . Cholecystectomy 02/10  . Vesicovaginal fistula closure w/ tah   . Tonsillectomy   . Ventral hernia repair 2007  . Cataract extraction 2008  . Cholecystectomy 2010  . Carpal tunnel release jan 2013    [name]         The following portions of the patient's history were reviewed and updated as appropriate: Allergies, current medications, and problem list.    Review of Systems:   12 Pt  review of systems was negative except those addressed in the HPI,     History   Social History  . Marital Status: Widowed    Spouse Name: N/A    Number of Children: 2  . Years of Education: N/A   Occupational History  . 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 Smoking status: Never Smoker   . Smokeless tobacco: Never Used  . Alcohol Use: Yes     Comment: rare  . Drug Use: No  . Sexually Active: Not on file   Other Topics Concern  . Not on file   Social History Narrative   ** Merged History Encounter ** Widowed. Has 2 children and lives alone.    Objective:  BP 132/70  Pulse 61  Temp 98.1 F (36.7 C) (Oral)  Resp 12  Ht 5' (1.524 m)  Wt  181 lb (82.101 kg)  BMI 35.35 kg/m2  SpO2 98%  General appearance: alert, cooperative and appears stated age Ears: normal TM's and external ear canals both ears Throat: lips, mucosa, and tongue normal; teeth and gums normal Neck: no adenopathy, no carotid bruit, supple, symmetrical, trachea midline and thyroid not enlarged, symmetric, no tenderness/mass/nodules Back: symmetric, no curvature. ROM normal. No CVA tenderness. Lungs: clear to auscultation bilaterally Heart: regular rate and rhythm, S1, S2 normal, no murmur, click, rub or gallop Abdomen: soft, non-tender; bowel sounds normal; no masses,  no organomegaly Pulses: 2+ and symmetric Skin: Skin color, texture, turgor normal. No rashes or lesions Lymph nodes: Cervical, supraclavicular, and axillary nodes normal. Neuro:  grossly nonfocal,  No nystagmus,  cerebellar function intact.    Assessment and Plan:  Vestibular dizziness Persistent with symptoms that are quite distressing to patient. MRI MRA was normal. Will refer to Dr. Tami Ribas per patient request for manipulation of inner ear crystals as this maybe helpful in alleviating her symptoms.   Updated Medication List Outpatient Encounter Prescriptions as of 06/19/2012  Medication Sig Dispense Refill  . acetaminophen (TYLENOL) 650 MG CR tablet Take 650 mg by mouth every 8 (eight) hours as needed.      . DiphenhydrAMINE HCl (BENADRYL ALLERGY PO) Take by mouth as needed.      . ergocalciferol (DRISDOL) 50000 UNITS capsule Take 1 capsule (50,000 Units total) by mouth once a week.  4 capsule  3  . furosemide (LASIX) 40 MG tablet One to two tablets daily as needed for fluid retention  80 tablet  2  . gabapentin (NEURONTIN) 100 MG capsule Take 1 capsule (100 mg total) by mouth 3 (three) times daily.  90 capsule  3  . hydrALAZINE (APRESOLINE) 25 MG tablet Take 1 tablet (25 mg total) by mouth 3 (three) times daily.  30 tablet  3  . levothyroxine (SYNTHROID, LEVOTHROID) 88 MCG tablet Take 1 tablet (88 mcg total) by mouth daily.  30 tablet  3  . loperamide (IMODIUM A-D) 2 MG tablet Take 2 mg by mouth 4 (four) times daily as needed.        Marland Kitchen losartan (COZAAR) 100 MG tablet Take 1 tablet (100 mg total) by mouth daily.  30 tablet  3  . metoprolol (LOPRESSOR) 50 MG tablet Take 1 tablet (50 mg total) by mouth 2 (two) times daily.  60 tablet  5  . mometasone-formoterol (DULERA) 100-5 MCG/ACT AERO Inhale 2 puffs into the lungs 2 (two) times daily.      . pantoprazole (PROTONIX) 40 MG tablet Take 1 tablet (40 mg total) by mouth 2 (two) times daily before a meal.  60 tablet  1  . sertraline (ZOLOFT) 50 MG tablet Take 50 mg by mouth daily.        . traMADol (ULTRAM) 50 MG tablet Take 1 tablet (50 mg total) by mouth every 6 (six) hours as needed.  120 tablet  2  . [DISCONTINUED]  amLODipine (NORVASC) 10 MG tablet Take 1 tablet (10 mg total) by mouth daily.  30 tablet  3     Orders Placed This Encounter  Procedures  . Ambulatory referral to ENT    Return in about 4 weeks (around 07/17/2012).

## 2012-06-19 NOTE — Patient Instructions (Addendum)
You can try Sudafed PE 10 mg every 6 hours as needed the next time you have a case of vertigo to see if it responds to a decongestant.  Referral to [facility] under way  Try suspending the gabapentin to see if the fluid retention improves.   PLEASE GET A LIFE LINE!!  Return in one month

## 2012-06-20 ENCOUNTER — Other Ambulatory Visit: Payer: Self-pay

## 2012-06-20 MED ORDER — AMLODIPINE BESYLATE 10 MG PO TABS: 10.0000 mg | ORAL_TABLET | Freq: Every day | ORAL | Status: DC

## 2012-06-20 NOTE — Telephone Encounter (Signed)
Refill request for Amlodipine 10 mg # 30 3 R sent electronic to [place]

## 2012-06-22 ENCOUNTER — Ambulatory Visit (INDEPENDENT_AMBULATORY_CARE_PROVIDER_SITE_OTHER): Payer: Medicare Other | Admitting: Internal Medicine

## 2012-06-22 ENCOUNTER — Ambulatory Visit: Payer: Medicare Other

## 2012-06-22 ENCOUNTER — Encounter: Payer: Self-pay | Admitting: Internal Medicine

## 2012-06-22 DIAGNOSIS — E538 Deficiency of other specified B group vitamins: Secondary | ICD-10-CM

## 2012-06-22 DIAGNOSIS — D518 Other vitamin B12 deficiency anemias: Secondary | ICD-10-CM

## 2012-06-22 DIAGNOSIS — D519 Vitamin B12 deficiency anemia, unspecified: Secondary | ICD-10-CM

## 2012-06-22 MED ORDER — CYANOCOBALAMIN 1000 MCG/ML IJ SOLN
1000.0000 ug | Freq: Once | INTRAMUSCULAR | Status: AC
  Administered 2012-06-22: 1000 ug via INTRAMUSCULAR

## 2012-06-22 NOTE — Assessment & Plan Note (Signed)
Persistent with symptoms that are quite distressing to patient. MRI MRA was normal. Will refer to Dr. Tami Ribas per patient request for manipulation of inner ear crystals as this maybe helpful in alleviating her symptoms.

## 2012-06-24 DIAGNOSIS — D631 Anemia in chronic kidney disease: Secondary | ICD-10-CM | POA: Insufficient documentation

## 2012-06-24 NOTE — Progress Notes (Signed)
Patient ID: Savannah Wilson, female   DOB: Jun 12, 1929, 78 y.o.   MRN: [id number] Patient is here for her B12 injection.

## 2012-07-04 ENCOUNTER — Telehealth: Payer: Self-pay | Admitting: Internal Medicine

## 2012-07-04 MED ORDER — PANTOPRAZOLE SODIUM 40 MG PO TBEC: 40.0000 mg | DELAYED_RELEASE_TABLET | Freq: Two times a day (BID) | ORAL | Status: DC

## 2012-07-04 NOTE — Telephone Encounter (Signed)
Refill request for Protonix 40 mg #60 1 R sent electronic to [facility]

## 2012-07-04 NOTE — Telephone Encounter (Signed)
Refill request for pantoprazole sodium 40 mg ter #60 Sig: take one tablet by mouth twice daily one hour before meals

## 2012-07-16 ENCOUNTER — Other Ambulatory Visit: Payer: Self-pay

## 2012-07-16 MED ORDER — LEVOTHYROXINE SODIUM 88 MCG PO TABS: 88.0000 ug | ORAL_TABLET | Freq: Every day | ORAL | Status: DC

## 2012-07-16 NOTE — Telephone Encounter (Signed)
Refill request for Levothyroxine  88 mg sent electronic to [facility]

## 2012-07-17 ENCOUNTER — Other Ambulatory Visit: Payer: Self-pay

## 2012-07-17 MED ORDER — SERTRALINE HCL 50 MG PO TABS: 50.0000 mg | ORAL_TABLET | Freq: Every day | ORAL | Status: DC

## 2012-07-17 NOTE — Telephone Encounter (Signed)
Refill request from [facility]  for Sertraline 50 mg. Ok to refill

## 2012-07-23 ENCOUNTER — Ambulatory Visit (INDEPENDENT_AMBULATORY_CARE_PROVIDER_SITE_OTHER): Payer: Medicare Other | Admitting: Internal Medicine

## 2012-07-23 ENCOUNTER — Other Ambulatory Visit: Payer: Self-pay

## 2012-07-23 ENCOUNTER — Encounter: Payer: Self-pay | Admitting: Internal Medicine

## 2012-07-23 VITALS — BP 132/40 | HR 62 | Temp 97.6°F | Ht 60.0 in | Wt 177.2 lb

## 2012-07-23 DIAGNOSIS — I129 Hypertensive chronic kidney disease with stage 1 through stage 4 chronic kidney disease, or unspecified chronic kidney disease: Secondary | ICD-10-CM

## 2012-07-23 DIAGNOSIS — N189 Chronic kidney disease, unspecified: Secondary | ICD-10-CM

## 2012-07-23 DIAGNOSIS — R42 Dizziness and giddiness: Secondary | ICD-10-CM

## 2012-07-23 DIAGNOSIS — Z1239 Encounter for other screening for malignant neoplasm of breast: Secondary | ICD-10-CM

## 2012-07-23 DIAGNOSIS — H8190 Unspecified disorder of vestibular function, unspecified ear: Secondary | ICD-10-CM

## 2012-07-23 DIAGNOSIS — D519 Vitamin B12 deficiency anemia, unspecified: Secondary | ICD-10-CM

## 2012-07-23 DIAGNOSIS — D518 Other vitamin B12 deficiency anemias: Secondary | ICD-10-CM

## 2012-07-23 MED ORDER — CYANOCOBALAMIN 2000 MCG PO TABS: 2000.0000 ug | ORAL_TABLET | Freq: Every day | ORAL | Status: DC

## 2012-07-23 MED ORDER — ALPRAZOLAM 0.5 MG PO TABS: 0.5000 mg | ORAL_TABLET | Freq: Every evening | ORAL | Status: DC | PRN

## 2012-07-23 MED ORDER — GUAIFENESIN-CODEINE 100-10 MG/5ML PO SYRP: 10.0000 mL | ORAL_SOLUTION | Freq: Three times a day (TID) | ORAL | Status: DC | PRN

## 2012-07-23 NOTE — Progress Notes (Signed)
Patient ID: Natisha Cassada, female   DOB: Jan 05, 1929, 78 y.o.   MRN: [id number]    Patient Active Problem List  Diagnosis  . Asthma  . Depression  . GERD (gastroesophageal reflux disease)  . Venous insufficiency  . Barrett esophagus  . Renal insufficiency  . Systolic CHF, chronic  . SOB (shortness of breath)  . Hypertension, renal disease  . Restless legs syndrome  . Lower extremity pain, anterior  . Light headed  . Pancreatitis due to biliary obstruction  . Neuropathy  . Vestibular dizziness  . B12 deficiency anemia    Subjective:  CC:   Chief Complaint  Patient presents with  . Follow-up    HPI:   Lakedra Discher a 78 y.o. female who presents for follow up on chronic conditions,  Her vertigo has improved.  Dr Tami Ribas removed cerimen impaction and gave her a chewable medications after doing the Hallpike maneuver.   Has had a right knee injection which is  much .  Has been coughing for 2 weeks,  Now clear sputum.  No coughing spells like before.    Past Medical History  Diagnosis Date  . Hypothyroid   . Hyperlipidemia   . Diverticulosis   . Peripheral neuralgia   . Hyperkalemia   . Hypertension   . Asthma   . Depression   . GERD (gastroesophageal reflux disease)   . Venous insufficiency   . Barrett esophagus   . Chronic bronchitis   . Diverticular disease   . Renal insufficiency   . Gout   . Pancreatitis due to biliary obstruction March 2010    s/p ERCP sphincterotomy, cholecystectomy    Past Surgical History  Procedure Date  . Ventral hernia repair   . Cataract extraction     right  . Cholecystectomy 02/10  . Vesicovaginal fistula closure w/ tah   . Tonsillectomy   . Ventral hernia repair 2007  . Cataract extraction 2008  . Cholecystectomy 2010  . Carpal tunnel release jan 2013    [name]         The following portions of the patient's history were reviewed and updated as appropriate: Allergies, current medications, and problem  list.    Review of Systems:   12 Pt  review of systems was negative except those addressed in the HPI,     History   Social History  . Marital Status: Widowed    Spouse Name: N/A    Number of Children: 2  . Years of Education: N/A   Occupational History  . 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 Smoking status: Never Smoker   . Smokeless tobacco: Never Used  . Alcohol Use: Yes     Comment: rare  . Drug Use: No  . Sexually Active: Not on file   Other Topics Concern  . Not on file   Social History Narrative   ** Merged History Encounter ** Widowed. Has 2 children and lives alone.    Objective:  BP 132/40  Pulse 62  Temp 97.6 F (36.4 C) (Oral)  Ht 5' (1.524 m)  Wt 177 lb 4 oz (80.4 kg)  BMI 34.62 kg/m2  SpO2 97%  General appearance: alert, cooperative and appears stated age Ears: normal TM's and external ear canals both ears Throat: lips, mucosa, and tongue normal; teeth and gums normal Neck: no adenopathy, no carotid bruit, supple, symmetrical, trachea midline and thyroid not enlarged, symmetric, no tenderness/mass/nodules Back: symmetric, no curvature.  ROM normal. No CVA tenderness. Lungs: clear to auscultation bilaterally Heart: regular rate and rhythm, S1, S2 normal, no murmur, click, rub or gallop Abdomen: soft, non-tender; bowel sounds normal; no masses,  no organomegaly Pulses: 2+ and symmetric Skin: Skin color, texture, turgor normal. No rashes or lesions Lymph nodes: Cervical, supraclavicular, and axillary nodes normal.  Assessment and Plan:  B12 deficiency anemia Monthly IM injection was not done today due to B12 shortage  Hypertension, renal disease Well controlled on current medications.  No changes today.  Light headed Improved. No recent falls.  Vestibular dizziness Improved with ENT Hallpike maneuvers.    Updated Medication List Outpatient Encounter Prescriptions as of 07/23/2012  Medication Sig Dispense  Refill  . acetaminophen (TYLENOL) 650 MG CR tablet Take 650 mg by mouth every 8 (eight) hours as needed.      Marland Kitchen amLODipine (NORVASC) 10 MG tablet Take 1 tablet (10 mg total) by mouth daily.  30 tablet  3  . DiphenhydrAMINE HCl (BENADRYL ALLERGY PO) Take by mouth as needed.      . ergocalciferol (DRISDOL) 50000 UNITS capsule Take 1 capsule (50,000 Units total) by mouth once a week.  4 capsule  3  . furosemide (LASIX) 40 MG tablet One to two tablets daily as needed for fluid retention  80 tablet  2  . gabapentin (NEURONTIN) 100 MG capsule Take 1 capsule (100 mg total) by mouth 3 (three) times daily.  90 capsule  3  . hydrALAZINE (APRESOLINE) 25 MG tablet Take 1 tablet (25 mg total) by mouth 3 (three) times daily.  30 tablet  3  . levothyroxine (SYNTHROID, LEVOTHROID) 88 MCG tablet Take 1 tablet (88 mcg total) by mouth daily.  30 tablet  3  . loperamide (IMODIUM A-D) 2 MG tablet Take 2 mg by mouth 4 (four) times daily as needed.        Marland Kitchen losartan (COZAAR) 100 MG tablet Take 1 tablet (100 mg total) by mouth daily.  30 tablet  3  . metoprolol (LOPRESSOR) 50 MG tablet Take 1 tablet (50 mg total) by mouth 2 (two) times daily.  60 tablet  5  . mometasone-formoterol (DULERA) 100-5 MCG/ACT AERO Inhale 2 puffs into the lungs 2 (two) times daily.      . pantoprazole (PROTONIX) 40 MG tablet Take 1 tablet (40 mg total) by mouth 2 (two) times daily before a meal.  60 tablet  1  . sertraline (ZOLOFT) 50 MG tablet Take 1 tablet (50 mg total) by mouth daily.  90 tablet  3  . traMADol (ULTRAM) 50 MG tablet Take 1 tablet (50 mg total) by mouth every 6 (six) hours as needed.  120 tablet  2  . ALPRAZolam (XANAX) 0.5 MG tablet Take 1 tablet (0.5 mg total) by mouth at bedtime as needed for sleep.  30 tablet  3  . cyanocobalamin ([employer] VITAMIN B12) 2000 MCG tablet Take 1 tablet (2,000 mcg total) by mouth daily.  30 tablet  1  . guaiFENesin-codeine (ROBITUSSIN AC) 100-10 MG/5ML syrup Take 10 mLs by mouth 3 (three) times daily  as needed for cough.  180 mL  0     Orders Placed This Encounter  Procedures  . MM Digital Screening    No Follow-up on file.

## 2012-07-23 NOTE — Patient Instructions (Addendum)
Start with 1/2 tablet of alprazolam at bedtime if needed for anxiety .  You may take 2nd half if needed in 30 minutes.   You may use the cough syrup as needed  Try using Simply Saline to flush sinuses once or twice daily   There is no B12 injection available bc there is a Adult nurse.  You can take the oral tablets daily until we get some in.

## 2012-07-24 NOTE — Assessment & Plan Note (Signed)
Well controlled on current medications.  No changes today. 

## 2012-07-24 NOTE — Assessment & Plan Note (Signed)
Improved with ENT Hallpike maneuvers.

## 2012-07-24 NOTE — Assessment & Plan Note (Signed)
Improved. No recent falls.

## 2012-07-24 NOTE — Assessment & Plan Note (Signed)
Monthly IM injection was not done today due to B12 shortage

## 2012-08-21 ENCOUNTER — Ambulatory Visit (INDEPENDENT_AMBULATORY_CARE_PROVIDER_SITE_OTHER): Payer: Medicare Other | Admitting: Internal Medicine

## 2012-08-21 ENCOUNTER — Encounter: Payer: Self-pay | Admitting: Internal Medicine

## 2012-08-21 VITALS — BP 132/82 | HR 58 | Temp 98.1°F | Resp 15 | Wt 175.8 lb

## 2012-08-21 DIAGNOSIS — R109 Unspecified abdominal pain: Secondary | ICD-10-CM

## 2012-08-21 DIAGNOSIS — R52 Pain, unspecified: Secondary | ICD-10-CM

## 2012-08-21 DIAGNOSIS — K859 Acute pancreatitis without necrosis or infection, unspecified: Secondary | ICD-10-CM

## 2012-08-21 DIAGNOSIS — N390 Urinary tract infection, site not specified: Secondary | ICD-10-CM

## 2012-08-21 DIAGNOSIS — K831 Obstruction of bile duct: Secondary | ICD-10-CM

## 2012-08-21 DIAGNOSIS — R1032 Left lower quadrant pain: Secondary | ICD-10-CM

## 2012-08-21 DIAGNOSIS — K5792 Diverticulitis of intestine, part unspecified, without perforation or abscess without bleeding: Secondary | ICD-10-CM | POA: Insufficient documentation

## 2012-08-21 LAB — CBC WITH DIFFERENTIAL/PLATELET
Basophils Absolute: 0 10*3/uL (ref 0.0–0.1)
HCT: 30.9 % — ABNORMAL LOW (ref 36.0–46.0)
Hemoglobin: 10.3 g/dL — ABNORMAL LOW (ref 12.0–15.0)
Lymphs Abs: 1.3 10*3/uL (ref 0.7–4.0)
MCHC: 33.2 g/dL (ref 30.0–36.0)
MCV: 88.4 fl (ref 78.0–100.0)
Monocytes Absolute: 0.7 10*3/uL (ref 0.1–1.0)
Monocytes Relative: 8.9 % (ref 3.0–12.0)
Neutro Abs: 5.3 10*3/uL (ref 1.4–7.7)
Platelets: 265 10*3/uL (ref 150.0–400.0)
RDW: 13.8 % (ref 11.5–14.6)

## 2012-08-21 LAB — COMPREHENSIVE METABOLIC PANEL
ALT: 10 U/L (ref 0–35)
AST: 13 U/L (ref 0–37)
Albumin: 3.4 g/dL — ABNORMAL LOW (ref 3.5–5.2)
Alkaline Phosphatase: 125 U/L — ABNORMAL HIGH (ref 39–117)
Glucose, Bld: 95 mg/dL (ref 70–99)
Potassium: 4.4 mEq/L (ref 3.5–5.1)
Sodium: 136 mEq/L (ref 135–145)
Total Bilirubin: 1.4 mg/dL — ABNORMAL HIGH (ref 0.3–1.2)
Total Protein: 6.8 g/dL (ref 6.0–8.3)

## 2012-08-21 LAB — POCT URINALYSIS DIPSTICK
Glucose, UA: NEGATIVE
Ketones, UA: NEGATIVE
Spec Grav, UA: 1.01
Urobilinogen, UA: 0.2

## 2012-08-21 LAB — C-REACTIVE PROTEIN: CRP: <0.5 mg/dL (ref 0.5–20.0)

## 2012-08-21 LAB — LIPASE: Lipase: 29 U/L (ref 11.0–59.0)

## 2012-08-21 MED ORDER — METRONIDAZOLE 500 MG PO TABS: 500.0000 mg | ORAL_TABLET | Freq: Three times a day (TID) | ORAL | Status: DC

## 2012-08-21 MED ORDER — LACTULOSE 20 GM/30ML PO SOLN: 30.0000 mL | Freq: Every day | ORAL | Status: DC

## 2012-08-21 MED ORDER — CIPROFLOXACIN HCL 250 MG PO TABS: 250.0000 mg | ORAL_TABLET | Freq: Two times a day (BID) | ORAL | Status: DC

## 2012-08-21 NOTE — Progress Notes (Signed)
Patient ID: Savannah Wilson, female   DOB: 08/26/1928, 79 y.o.   MRN: [id number]  Patient Active Problem List  Diagnosis  . Asthma  . Depression  . GERD (gastroesophageal reflux disease)  . Venous insufficiency  . Barrett esophagus  . Renal insufficiency  . Systolic CHF, chronic  . SOB (shortness of breath)  . Hypertension, renal disease  . Restless legs syndrome  . Lower extremity pain, anterior  . Light headed  . Pancreatitis due to biliary obstruction  . Neuropathy  . Vestibular dizziness  . B12 deficiency anemia  . Acute left lower quadrant pain    Subjective:  CC:   Chief Complaint  Patient presents with  . Abdominal Pain    Left, middle abdomen    HPI:   Savannah Wilson is a 79 y.o. female who presents with a  2 day history of LLQ pain which has been constant since this morning, albeit improved from an 8 to a 4.  Pain started on Sunday ,  No fevers. Bowel movements have been accompanied by gas,  BMS have been loose for the past 2days. No nausea, no dysuria or frequency. History of diverticulitis, last episode  2 or 3 years ago .  Last colonoscopy 10 years ago.,.  Denies recent constipaqtion,  Did eat nuts over the weekend.     Past Medical History  Diagnosis Date  . Hypothyroid   . Hyperlipidemia   . Diverticulosis   . Peripheral neuralgia   . Hyperkalemia   . Hypertension   . Asthma   . Depression   . GERD (gastroesophageal reflux disease)   . Venous insufficiency   . Barrett esophagus   . Chronic bronchitis   . Diverticular disease   . Renal insufficiency   . Gout   . Pancreatitis due to biliary obstruction March 2010    s/p ERCP sphincterotomy, cholecystectomy    Past Surgical History  Procedure Date  . Ventral hernia repair   . Cataract extraction     right  . Cholecystectomy 02/10  . Vesicovaginal fistula closure w/ tah   . Tonsillectomy   . Ventral hernia repair 2007  . Cataract extraction 2008  . Cholecystectomy 2010  . Carpal tunnel release  jan 2013    [name]         The following portions of the patient's history were reviewed and updated as appropriate: Allergies, current medications, and problem list.    Review of Systems:   12 Pt  review of systems was negative except those addressed in the HPI,     History   Social History  . Marital Status: Widowed    Spouse Name: N/A    Number of Children: 2  . Years of Education: N/A   Occupational History  . 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 Smoking status: Never Smoker   . Smokeless tobacco: Never Used  . Alcohol Use: Yes     Comment: rare  . Drug Use: No  . Sexually Active: Not on file   Other Topics Concern  . Not on file   Social History Narrative   ** Merged History Encounter ** Widowed. Has 2 children and lives alone.    Objective:  BP 132/82  Pulse 58  Temp 98.1 F (36.7 C) (Oral)  Resp 15  Wt 175 lb 12 oz (79.72 kg)  SpO2 98%  General appearance: alert, cooperative and appears stated age Ears: normal TM's and external ear  canals both ears Throat: lips, mucosa, and tongue normal; teeth and gums normal Neck: no adenopathy, no carotid bruit, supple, symmetrical, trachea midline and thyroid not enlarged, symmetric, no tenderness/mass/nodules Back: symmetric, no curvature. ROM normal. No CVA tenderness. Lungs: clear to auscultation bilaterally Heart: regular rate and rhythm, S1, S2 normal, no murmur, click, rub or gallop Abdomen: soft, tender LLQ without rebound or guarding ; no masses,  no organomegaly Pulses: 2+ and symmetric Skin: Skin color, texture, turgor normal. No rashes or lesions Lymph nodes: Cervical, supraclavicular, and axillary nodes normal.  Assessment and Plan:  Acute left lower quadrant pain Treating for diverticulitis given history of diverticulosis , recent peanut ingestion but checking lipase enstory of pancreatitis and sending urine for culture.,  Cipro,flagyl and lactulose rxs sent to  pharmacy.  Clear liquid diet for first 24 hours. Advance to full liquid if tolerated.    Updated Medication List Outpatient Encounter Prescriptions as of 08/21/2012  Medication Sig Dispense Refill  . acetaminophen (TYLENOL) 650 MG CR tablet Take 650 mg by mouth every 8 (eight) hours as needed.      . ALPRAZolam (XANAX) 0.5 MG tablet Take 1 tablet (0.5 mg total) by mouth at bedtime as needed for sleep.  30 tablet  3  . amLODipine (NORVASC) 10 MG tablet Take 1 tablet (10 mg total) by mouth daily.  30 tablet  3  . cyanocobalamin ([employer] VITAMIN B12) 2000 MCG tablet Take 1 tablet (2,000 mcg total) by mouth daily.  30 tablet  1  . DiphenhydrAMINE HCl (BENADRYL ALLERGY PO) Take by mouth as needed.      . ergocalciferol (DRISDOL) 50000 UNITS capsule Take 1 capsule (50,000 Units total) by mouth once a week.  4 capsule  3  . furosemide (LASIX) 40 MG tablet One to two tablets daily as needed for fluid retention  80 tablet  2  . gabapentin (NEURONTIN) 100 MG capsule Take 1 capsule (100 mg total) by mouth 3 (three) times daily.  90 capsule  3  . hydrALAZINE (APRESOLINE) 25 MG tablet Take 1 tablet (25 mg total) by mouth 3 (three) times daily.  30 tablet  3  . levothyroxine (SYNTHROID, LEVOTHROID) 88 MCG tablet Take 1 tablet (88 mcg total) by mouth daily.  30 tablet  3  . loperamide (IMODIUM A-D) 2 MG tablet Take 2 mg by mouth 4 (four) times daily as needed.        Marland Kitchen losartan (COZAAR) 100 MG tablet Take 1 tablet (100 mg total) by mouth daily.  30 tablet  3  . metoprolol (LOPRESSOR) 50 MG tablet Take 1 tablet (50 mg total) by mouth 2 (two) times daily.  60 tablet  5  . mometasone-formoterol (DULERA) 100-5 MCG/ACT AERO Inhale 2 puffs into the lungs 2 (two) times daily.      . pantoprazole (PROTONIX) 40 MG tablet Take 1 tablet (40 mg total) by mouth 2 (two) times daily before a meal.  60 tablet  1  . sertraline (ZOLOFT) 50 MG tablet Take 1 tablet (50 mg total) by mouth daily.  90 tablet  3  . traMADol (ULTRAM) 50  MG tablet Take 1 tablet (50 mg total) by mouth every 6 (six) hours as needed.  120 tablet  2  . ciprofloxacin (CIPRO) 250 MG tablet Take 1 tablet (250 mg total) by mouth 2 (two) times daily.  6 tablet  0  . guaiFENesin-codeine (ROBITUSSIN AC) 100-10 MG/5ML syrup Take 10 mLs by mouth 3 (three) times daily as needed for  cough.  180 mL  0  . Lactulose 20 GM/30ML SOLN Take 30 mLs (20 g total) by mouth daily. As needed for constipation , may repeat in 6 hours if needed  240 mL  0  . metroNIDAZOLE (FLAGYL) 500 MG tablet Take 1 tablet (500 mg total) by mouth 3 (three) times daily.  21 tablet  0     Orders Placed This Encounter  Procedures  . Urine culture  . C-reactive protein  . Sedimentation rate  . Comprehensive metabolic panel  . Lipase  . CBC with Differential  . POCT urinalysis dipstick    No Follow-up on file.

## 2012-08-21 NOTE — Assessment & Plan Note (Signed)
Treating for diverticulitis given history of diverticulosis , recent peanut ingestion but checking lipase enstory of pancreatitis and sending urine for culture.,  Cipro,flagyl and lactulose rxs sent to pharmacy.  Clear liquid diet for first 24 hours. Advance to full liquid if tolerated.

## 2012-08-21 NOTE — Patient Instructions (Addendum)
I am concerned that your abdominal pain may be from a UTI or from diverticulitis.  I am treating you for both with ciprofloxacin and metronidazole (antibiotics) , and lactulose to take once or twice daily to keep bowels moving.  Please follow a clear liquid diet for 24 hours and then you may advabce to mashed potatoes tomorrow evening if you are feeling better.

## 2012-08-23 ENCOUNTER — Emergency Department: Payer: Self-pay | Admitting: Emergency Medicine

## 2012-08-23 LAB — URINALYSIS, COMPLETE
Bilirubin,UR: NEGATIVE
Blood: NEGATIVE
Leukocyte Esterase: NEGATIVE
Specific Gravity: 1.006 (ref 1.003–1.030)
Squamous Epithelial: 2
WBC UR: 1 /HPF (ref 0–5)

## 2012-08-23 LAB — CBC WITH DIFFERENTIAL/PLATELET
Basophil #: 0.1 10*3/uL (ref 0.0–0.1)
Eosinophil #: 0.1 10*3/uL (ref 0.0–0.7)
Eosinophil %: 1.3 %
Lymphocyte #: 1 10*3/uL (ref 1.0–3.6)
MCH: 28.1 pg (ref 26.0–34.0)
MCHC: 31.5 g/dL — ABNORMAL LOW (ref 32.0–36.0)
Monocyte %: 7.8 %
Neutrophil %: 76.5 %
Platelet: 231 10*3/uL (ref 150–440)
RBC: 3.65 10*6/uL — ABNORMAL LOW (ref 3.80–5.20)
RDW: 13.7 % (ref 11.5–14.5)

## 2012-08-23 LAB — URINE CULTURE: Colony Count: 50000

## 2012-08-23 LAB — COMPREHENSIVE METABOLIC PANEL
Albumin: 3.4 g/dL (ref 3.4–5.0)
Alkaline Phosphatase: 158 U/L — ABNORMAL HIGH (ref 50–136)
Creatinine: 1.52 mg/dL — ABNORMAL HIGH (ref 0.60–1.30)
EGFR (Non-African Amer.): 32 — ABNORMAL LOW
Glucose: 117 mg/dL — ABNORMAL HIGH (ref 65–99)
SGOT(AST): 34 U/L (ref 15–37)
Sodium: 135 mmol/L — ABNORMAL LOW (ref 136–145)

## 2012-08-23 LAB — LIPASE, BLOOD: Lipase: 136 U/L (ref 73–393)

## 2012-08-24 ENCOUNTER — Ambulatory Visit (INDEPENDENT_AMBULATORY_CARE_PROVIDER_SITE_OTHER): Payer: Medicare Other | Admitting: Internal Medicine

## 2012-08-24 ENCOUNTER — Encounter: Payer: Self-pay | Admitting: Internal Medicine

## 2012-08-24 VITALS — BP 140/72 | HR 55 | Temp 97.6°F | Resp 16 | Wt 176.5 lb

## 2012-08-24 DIAGNOSIS — I10 Essential (primary) hypertension: Secondary | ICD-10-CM

## 2012-08-24 DIAGNOSIS — H8190 Unspecified disorder of vestibular function, unspecified ear: Secondary | ICD-10-CM

## 2012-08-24 DIAGNOSIS — N189 Chronic kidney disease, unspecified: Secondary | ICD-10-CM

## 2012-08-24 DIAGNOSIS — N184 Chronic kidney disease, stage 4 (severe): Secondary | ICD-10-CM

## 2012-08-24 DIAGNOSIS — R52 Pain, unspecified: Secondary | ICD-10-CM

## 2012-08-24 DIAGNOSIS — R001 Bradycardia, unspecified: Secondary | ICD-10-CM

## 2012-08-24 DIAGNOSIS — J90 Pleural effusion, not elsewhere classified: Secondary | ICD-10-CM

## 2012-08-24 DIAGNOSIS — R42 Dizziness and giddiness: Secondary | ICD-10-CM

## 2012-08-24 DIAGNOSIS — R1032 Left lower quadrant pain: Secondary | ICD-10-CM

## 2012-08-24 DIAGNOSIS — N281 Cyst of kidney, acquired: Secondary | ICD-10-CM

## 2012-08-24 DIAGNOSIS — I129 Hypertensive chronic kidney disease with stage 1 through stage 4 chronic kidney disease, or unspecified chronic kidney disease: Secondary | ICD-10-CM

## 2012-08-24 DIAGNOSIS — I498 Other specified cardiac arrhythmias: Secondary | ICD-10-CM

## 2012-08-24 NOTE — Patient Instructions (Addendum)
If you develop a pulse < 50,  Stop the metoprolol immediately,  Increase the hydralazine to 50 mg every 8 hoursif needed to control blood pressure  If your pulse is 50 to 65 and you feel too tired,  Reduce the metoprolol to 1/2 tablet twice a day,  You can increase the hydralazine if needed for blood pressur eto 50 mg every 8 hours   No grits, dairy,  Greasy food or butter, or salad until your bowels are solid again

## 2012-08-26 ENCOUNTER — Encounter: Payer: Self-pay | Admitting: Internal Medicine

## 2012-08-26 DIAGNOSIS — R001 Bradycardia, unspecified: Secondary | ICD-10-CM | POA: Insufficient documentation

## 2012-08-26 DIAGNOSIS — J90 Pleural effusion, not elsewhere classified: Secondary | ICD-10-CM | POA: Insufficient documentation

## 2012-08-26 DIAGNOSIS — N281 Cyst of kidney, acquired: Secondary | ICD-10-CM | POA: Insufficient documentation

## 2012-08-26 NOTE — Assessment & Plan Note (Signed)
There was no evidence of stone or UTI and CT scan showed no evidence of diverticulitis. Nevertheless I have recommended that she continue the full course of cipro since the CT was noncontrasted.

## 2012-08-26 NOTE — Assessment & Plan Note (Signed)
Secondary to metoprolol.  She is asymptomatic but has been advised to stop or reduce the metoprolol depnding on development of symptoms or pulse < 50 .

## 2012-08-26 NOTE — Assessment & Plan Note (Signed)
Symptomatic with no history of malignancy or URI recently.  But a history of chronic systolic dysfunction.  Discussed delaying workup until after Dr. Kingsley Callander sees her

## 2012-08-26 NOTE — Progress Notes (Signed)
Patient ID: Savannah Wilson, female   DOB: 1929-03-30, 79 y.o.   MRN: [id number]  Patient Active Problem List  Diagnosis  . Asthma  . Depression  . GERD (gastroesophageal reflux disease)  . Venous insufficiency  . Barrett esophagus  . CKD (chronic kidney disease), stage IV  . Systolic CHF, chronic  . SOB (shortness of breath)  . Hypertension, renal disease  . Restless legs syndrome  . Lower extremity pain, anterior  . Light headed  . Pancreatitis due to biliary obstruction  . Neuropathy  . Vestibular dizziness  . B12 deficiency anemia  . Diverticulitis  . Bradycardia, drug induced  . Renal cysts, acquired, bilateral    Subjective:  CC:   Chief Complaint  Patient presents with  . Follow-up    Hospital ER    HPI:   Savannah Wilson a 79 y.o. female who presents ER follow up.  Patient went to ER  On 1/16 after developing nausea and persistent abdominal pain.    She was evaluated with a noncontrasted CT of the abdomen and pelvis, which showed no evidence of diverticulitis, bilateral exophytic renal cysts which were comparatively larger (per report) that prior imaging (although no CT or ultrasound has been done in at least 3 years)   Her nausea was attributed to the metronidazole by the ER physician so she was advised to stop it . Marland Kitchen She denies diarrhea .   Past Medical History  Diagnosis Date  . Hypothyroid   . Hyperlipidemia   . Diverticulosis   . Peripheral neuralgia   . Hyperkalemia   . Hypertension   . Asthma   . Depression   . GERD (gastroesophageal reflux disease)   . Venous insufficiency   . Barrett esophagus   . Chronic bronchitis   . Diverticular disease   . Renal insufficiency   . Gout   . Pancreatitis due to biliary obstruction March 2010    s/p ERCP sphincterotomy, cholecystectomy    Past Surgical History  Procedure Date  . Ventral hernia repair   . Cataract extraction     right  . Cholecystectomy 02/10  . Vesicovaginal fistula closure w/ tah   .  Tonsillectomy   . Ventral hernia repair 2007  . Cataract extraction 2008  . Cholecystectomy 2010  . Carpal tunnel release jan 2013    [name]         The following portions of the patient's history were reviewed and updated as appropriate: Allergies, current medications, and problem list.    Review of Systems:   12 Pt  review of systems was negative except those addressed in the HPI,     History   Social History  . Marital Status: Widowed    Spouse Name: N/A    Number of Children: 2  . Years of Education: N/A   Occupational History  . 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 Smoking status: Never Smoker   . Smokeless tobacco: Never Used  . Alcohol Use: Yes     Comment: rare  . Drug Use: No  . Sexually Active: Not on file   Other Topics Concern  . Not on file   Social History Narrative   ** Merged History Encounter ** Widowed. Has 2 children and lives alone.    Objective:  BP 140/72  Pulse 55  Temp 97.6 F (36.4 C) (Oral)  Resp 16  Wt 176 lb 8 oz (80.06 kg)  SpO2 96%  General  appearance: alert, cooperative and appears stated age Ears: normal TM's and external ear canals both ears Throat: lips, mucosa, and tongue normal; teeth and gums normal Neck: no adenopathy, no carotid bruit, supple, symmetrical, trachea midline and thyroid not enlarged, symmetric, no tenderness/mass/nodules Back: symmetric, no curvature. ROM normal. No CVA tenderness. Lungs: clear to auscultation bilaterally Heart: regular rate and rhythm, S1, S2 normal, no murmur, click, rub or gallop Abdomen: soft, non-tender; bowel sounds normal; no masses,  no organomegaly Pulses: 2+ and symmetric Skin: Skin color, texture, turgor normal. No rashes or lesions Lymph nodes: Cervical, supraclavicular, and axillary nodes normal.  Assessment and Plan:  Vestibular dizziness Resolved after ENT evaluation and Hallpike maneuver. In December .  Bradycardia, drug  induced Secondary to metoprolol.  She is asymptomatic but has been advised to stop or reduce the metoprolol depnding on development of symptoms or pulse < 50 .  Hypertension, renal disease managed with  Losartan, amlodipine,  low dose hydralazine and metoprolol.  Instructed to increased the hydralazine to 50 mg tid if metoprolol is reduced or stopped.   Acute left lower quadrant pain There was no evidence of stone or UTI and CT scan showed no evidence of diverticulitis. Nevertheless I have recommended that she continue the full course of cipro since the CT was noncontrasted.   Renal cysts, acquired, bilateral Review of prior nephrology reports indicated that the left renal cyst is chronic. The right renal cyst may be new. Last ultrasound was April 2012.  With relative increase in size per radiology, although there has been no recent imaging in over 3 years.  She has an appt with Dr. Eduard Clos next week and will review CT scan with him   CKD (chronic kidney disease), stage IV Stable, stage IV.  Follow up with [facility] in late jan. Lytes are stable   Pleural effusion, bilateral Symptomatic with no history of malignancy or URI recently.  But a history of chronic systolic dysfunction.  Discussed delaying workup until after Dr. Kingsley Callander sees her    Updated Medication List Outpatient Encounter Prescriptions as of 08/24/2012  Medication Sig Dispense Refill  . acetaminophen (TYLENOL) 650 MG CR tablet Take 650 mg by mouth every 8 (eight) hours as needed.      . ALPRAZolam (XANAX) 0.5 MG tablet Take 1 tablet (0.5 mg total) by mouth at bedtime as needed for sleep.  30 tablet  3  . amLODipine (NORVASC) 10 MG tablet Take 1 tablet (10 mg total) by mouth daily.  30 tablet  3  . cyanocobalamin ([employer] VITAMIN B12) 2000 MCG tablet Take 1 tablet (2,000 mcg total) by mouth daily.  30 tablet  1  . DiphenhydrAMINE HCl (BENADRYL ALLERGY PO) Take by mouth as needed.      . ergocalciferol (DRISDOL) 50000 UNITS capsule  Take 1 capsule (50,000 Units total) by mouth once a week.  4 capsule  3  . furosemide (LASIX) 40 MG tablet One to two tablets daily as needed for fluid retention  80 tablet  2  . gabapentin (NEURONTIN) 100 MG capsule Take 1 capsule (100 mg total) by mouth 3 (three) times daily.  90 capsule  3  . guaiFENesin-codeine (ROBITUSSIN AC) 100-10 MG/5ML syrup Take 10 mLs by mouth 3 (three) times daily as needed for cough.  180 mL  0  . hydrALAZINE (APRESOLINE) 25 MG tablet Take 1 tablet (25 mg total) by mouth 3 (three) times daily.  30 tablet  3  . Lactulose 20 GM/30ML SOLN Take 30 mLs (  20 g total) by mouth daily. As needed for constipation , may repeat in 6 hours if needed  240 mL  0  . levothyroxine (SYNTHROID, LEVOTHROID) 88 MCG tablet Take 1 tablet (88 mcg total) by mouth daily.  30 tablet  3  . loperamide (IMODIUM A-D) 2 MG tablet Take 2 mg by mouth 4 (four) times daily as needed.        Marland Kitchen losartan (COZAAR) 100 MG tablet Take 1 tablet (100 mg total) by mouth daily.  30 tablet  3  . metoprolol (LOPRESSOR) 50 MG tablet Take 1 tablet (50 mg total) by mouth 2 (two) times daily.  60 tablet  5  . mometasone-formoterol (DULERA) 100-5 MCG/ACT AERO Inhale 2 puffs into the lungs 2 (two) times daily.      . ondansetron (ZOFRAN) 4 MG tablet Take 4 mg by mouth every 4 (four) hours as needed. For nausea and vomiting      . pantoprazole (PROTONIX) 40 MG tablet Take 1 tablet (40 mg total) by mouth 2 (two) times daily before a meal.  60 tablet  1  . sertraline (ZOLOFT) 50 MG tablet Take 1 tablet (50 mg total) by mouth daily.  90 tablet  3  . traMADol (ULTRAM) 50 MG tablet Take 1 tablet (50 mg total) by mouth every 6 (six) hours as needed.  120 tablet  2  . [DISCONTINUED] ciprofloxacin (CIPRO) 250 MG tablet Take 1 tablet (250 mg total) by mouth 2 (two) times daily.  6 tablet  0  . [DISCONTINUED] metroNIDAZOLE (FLAGYL) 500 MG tablet Take 1 tablet (500 mg total) by mouth 3 (three) times daily.  21 tablet  0

## 2012-08-26 NOTE — Assessment & Plan Note (Signed)
Stable, stage IV.  Follow up with [facility] in late jan. Lytes are stable

## 2012-08-26 NOTE — Assessment & Plan Note (Signed)
Resolved after ENT evaluation and Hallpike maneuver. In December .

## 2012-08-26 NOTE — Assessment & Plan Note (Addendum)
Review of prior nephrology reports indicated that the left renal cyst is chronic. The right renal cyst may be new. Last ultrasound was April 2012.  With relative increase in size per radiology, although there has been no recent imaging in over 3 years.  She has an appt with Dr. Eduard Clos next week and will review CT scan with him

## 2012-08-26 NOTE — Assessment & Plan Note (Addendum)
managed with  Losartan, amlodipine,  low dose hydralazine and metoprolol.  Instructed to increased the hydralazine to 50 mg tid if metoprolol is reduced or stopped.

## 2012-08-28 ENCOUNTER — Telehealth: Payer: Self-pay | Admitting: Internal Medicine

## 2012-08-28 NOTE — Telephone Encounter (Signed)
Pt is still in pain from Diverticulitis and she has stayed on her diet and that is still not helping. She is out of antibiotics and is still in pain. She would like a call back from the nurse to see what she needs to do ??

## 2012-08-28 NOTE — Telephone Encounter (Signed)
She needs to provide more information. Describbe her pain,  How severe? What type of diet is she on now?  Are her bowels moving regularly?   If her bowels are moving regularly her pain may be coming from the hip joint and not from diverticulitis at all since the CT scan did not show any inflammation. Marland Kitchen

## 2012-08-30 NOTE — Telephone Encounter (Signed)
If her bowels are moving normally and she is not having fevers, she will need to be seen, bc I am not convinced this is diverticultis given her normal labs and normal CT  . If she would like a medicationf for pain, i will authorize vicodin 5/325 one tablet every 6 hours prn pain  #60 no refills can call to pharamcy

## 2012-08-30 NOTE — Telephone Encounter (Signed)
Pt is having level 8 pain, constant pain. Located on the left side.  Pt is not eating meat,pt stated yesterday she had boiled chicken, rice, and scrambled eggs. Pt states that she has not had a bowel movement since yesterday.

## 2012-08-31 ENCOUNTER — Other Ambulatory Visit: Payer: Self-pay | Admitting: General Practice

## 2012-08-31 MED ORDER — HYDROCODONE-ACETAMINOPHEN 5-325 MG PO TABS: 1.0000 | ORAL_TABLET | Freq: Four times a day (QID) | ORAL | Status: DC | PRN

## 2012-08-31 NOTE — Telephone Encounter (Signed)
Med filled.  

## 2012-09-03 ENCOUNTER — Other Ambulatory Visit: Payer: Self-pay | Admitting: *Deleted

## 2012-09-04 MED ORDER — PANTOPRAZOLE SODIUM 40 MG PO TBEC: 40.0000 mg | DELAYED_RELEASE_TABLET | Freq: Two times a day (BID) | ORAL | Status: DC

## 2012-09-04 NOTE — Telephone Encounter (Signed)
Med filled.  

## 2012-09-05 ENCOUNTER — Telehealth: Payer: Self-pay | Admitting: Internal Medicine

## 2012-09-05 NOTE — Telephone Encounter (Signed)
Called pt phone busy.

## 2012-09-05 NOTE — Telephone Encounter (Signed)
Please find out if patient's pain has improved with the vicodin that we sent  In.  And is she tolerating a regular diet/

## 2012-09-06 NOTE — Telephone Encounter (Signed)
LMOVM for pt to return call 

## 2012-09-07 NOTE — Telephone Encounter (Signed)
Pt called stating she feels much better. Has not taken a Tramadol in 10 days and is on a regular diet.

## 2012-09-18 ENCOUNTER — Ambulatory Visit: Payer: Self-pay | Admitting: Internal Medicine

## 2012-10-01 ENCOUNTER — Other Ambulatory Visit: Payer: Self-pay | Admitting: *Deleted

## 2012-10-02 MED ORDER — FUROSEMIDE 40 MG PO TABS: ORAL_TABLET | ORAL | Status: DC

## 2012-10-02 NOTE — Telephone Encounter (Signed)
Med filled.  

## 2012-10-10 ENCOUNTER — Encounter: Payer: Self-pay | Admitting: Internal Medicine

## 2012-10-11 ENCOUNTER — Other Ambulatory Visit: Payer: Self-pay | Admitting: *Deleted

## 2012-10-16 MED ORDER — AMLODIPINE BESYLATE 10 MG PO TABS: 10.0000 mg | ORAL_TABLET | Freq: Every day | ORAL | Status: DC

## 2012-10-16 NOTE — Telephone Encounter (Signed)
Med filled.  

## 2012-10-18 ENCOUNTER — Telehealth: Payer: Self-pay | Admitting: *Deleted

## 2012-10-18 NOTE — Telephone Encounter (Signed)
Refill Request  B-12 1000 mcg  #100  Take two tablets by mouth daily

## 2012-10-19 MED ORDER — CYANOCOBALAMIN 2000 MCG PO TABS: 2000.0000 ug | ORAL_TABLET | Freq: Every day | ORAL | Status: DC

## 2012-10-19 NOTE — Telephone Encounter (Signed)
Med filled.  

## 2012-10-22 ENCOUNTER — Ambulatory Visit: Payer: Self-pay | Admitting: Internal Medicine

## 2012-10-22 ENCOUNTER — Telehealth: Payer: Self-pay | Admitting: Internal Medicine

## 2012-10-22 NOTE — Telephone Encounter (Signed)
Patient Information:  Caller Name: Troian  Phone: [phone]  Patient: Savannah Wilson, Savannah Wilson  Gender: Female  DOB: 1929/04/23  Age: 79 Years  PCP: Deborra Medina (Adults only)  Office Follow Up:  Does the office need to follow up with this patient?: No  Instructions For The Office: N/A  RN Note:  Is sleeping well without shortness of breath. Is only short of breath when walks for long distance in home.  Symptoms  Reason For Call & Symptoms: Has shortness of breath when walking since "last week". States has improved 3-17. Has cough since "last week". Mild wheezing. Uses "inhaler" and helps.  Reviewed Health History In EMR: Yes  Reviewed Medications In EMR: Yes  Reviewed Allergies In EMR: Yes  Reviewed Surgeries / Procedures: Yes  Date of Onset of Symptoms: 10/15/2012  Treatments Tried: cough syrup prescribed by MD  Treatments Tried Worked: Yes  Guideline(s) Used:  Cough  Asthma Attack  Disposition Per Guideline:   See Today or Tomorrow in Office  Reason For Disposition Reached:   Mild asthma attack (e.g., no SOB at rest, mild SOB with walking, speaks normally in sentences, mild wheezing) and persists > 24 hours on appropriate treatment  Advice Given:  N/A  Patient Will Follow Care Advice:  YES  Appointment Scheduled:  10/23/2012 09:15:00 Appointment Scheduled Provider:  Deborra Medina (Adults only)

## 2012-10-22 NOTE — Telephone Encounter (Signed)
FYI

## 2012-10-23 ENCOUNTER — Ambulatory Visit (INDEPENDENT_AMBULATORY_CARE_PROVIDER_SITE_OTHER): Payer: Medicare Other | Admitting: Internal Medicine

## 2012-10-23 ENCOUNTER — Encounter: Payer: Self-pay | Admitting: Internal Medicine

## 2012-10-23 VITALS — BP 146/50 | HR 58 | Temp 97.6°F | Resp 17 | Wt 178.8 lb

## 2012-10-23 DIAGNOSIS — R0602 Shortness of breath: Secondary | ICD-10-CM

## 2012-10-23 MED ORDER — MOMETASONE FURO-FORMOTEROL FUM 100-5 MCG/ACT IN AERO: 2.0000 | INHALATION_SPRAY | Freq: Two times a day (BID) | RESPIRATORY_TRACT | Status: DC

## 2012-10-23 MED ORDER — ALBUTEROL SULFATE HFA 108 (90 BASE) MCG/ACT IN AERS: 2.0000 | INHALATION_SPRAY | Freq: Four times a day (QID) | RESPIRATORY_TRACT | Status: DC | PRN

## 2012-10-23 MED ORDER — METHYLPREDNISOLONE ACETATE 40 MG/ML IJ SUSP
40.0000 mg | Freq: Once | INTRAMUSCULAR | Status: AC
  Administered 2012-10-23: 40 mg via INTRAMUSCULAR

## 2012-10-23 MED ORDER — AMOXICILLIN 500 MG PO CAPS: 500.0000 mg | ORAL_CAPSULE | Freq: Three times a day (TID) | ORAL | Status: DC

## 2012-10-23 MED ORDER — ALBUTEROL SULFATE (2.5 MG/3ML) 0.083% IN NEBU
2.5000 mg | INHALATION_SOLUTION | Freq: Once | RESPIRATORY_TRACT | Status: AC
  Administered 2012-10-23: 2.5 mg via RESPIRATORY_TRACT

## 2012-10-23 NOTE — Patient Instructions (Addendum)
I am treating you for an asthma exacerbation with a prednisone taper.  Start the prednisone today  Continue to use the [facility] inhaler twice daily as directed.  I have refilled it  I also sent an albeterol inhaler to your pharmacy,  This is your "rescue inhaler" to use every 6 hours if needed for shortness of breath   Start the amoxicillin if you develop a cough that produces phlegm that is yellow or green

## 2012-10-23 NOTE — Assessment & Plan Note (Addendum)
Secondary to viral URI.  Ambulatory sats were 96%. She is not using accessory muscles to breathe. She responded well to albuterol neb given here in the office and received a depo medrol IM injection.  Continue prednisone taper and bronchdilator therap with dealer at twice daily and when necessary albuterol. Followup in one week.Marland Kitchen

## 2012-10-23 NOTE — Progress Notes (Signed)
Patient ID: Savannah Wilson, female   DOB: 20-Jul-1929, 79 y.o.   MRN: [id number]   Patient Active Problem List  Diagnosis  . Asthma  . Depression  . GERD (gastroesophageal reflux disease)  . Venous insufficiency  . Barrett esophagus  . CKD (chronic kidney disease), stage IV  . Systolic CHF, chronic  . Asthma exacerbation  . Hypertension, renal disease  . Restless legs syndrome  . Lower extremity pain, anterior  . Light headed  . Pancreatitis due to biliary obstruction  . Neuropathy  . Vestibular dizziness  . B12 deficiency anemia  . Diverticulitis  . Bradycardia, drug induced  . Renal cysts, acquired, bilateral  . Pleural effusion, bilateral    Subjective:  CC:   Chief Complaint  Patient presents with  . SOB  . Cough    unproductive    HPI:   Savannah Wilson a 79 y.o. female who presents with dyspnea with exertion for the past 8 to 10 days,  Some nonproductive cough.  No fevers, chills or body aches.  Her dyspnea improves with use of inhaler .  Denies chest pain, weight gain or excessive fluid retention.  No sick contacts   Past Medical History  Diagnosis Date  . Hypothyroid   . Hyperlipidemia   . Diverticulosis   . Peripheral neuralgia   . Hyperkalemia   . Hypertension   . Asthma   . Depression   . GERD (gastroesophageal reflux disease)   . Venous insufficiency   . Barrett esophagus   . Chronic bronchitis   . Diverticular disease   . Renal insufficiency   . Gout   . Pancreatitis due to biliary obstruction March 2010    s/p ERCP sphincterotomy, cholecystectomy    Past Surgical History  Procedure Laterality Date  . Ventral hernia repair    . Cataract extraction      right  . Cholecystectomy  02/10  . Vesicovaginal fistula closure w/ tah    . Tonsillectomy    . Ventral hernia repair  2007  . Cataract extraction  2008  . Cholecystectomy  2010  . Carpal tunnel release  jan 2013    [name]       The following portions of the patient's history  were reviewed and updated as appropriate: Allergies, current medications, and problem list.    Review of Systems:   Patient denies headache, fevers, malaise, unintentional weight loss, skin rash, eye pain, sinus congestion and sinus pain, sore throat, dysphagia,  hemoptysis , cough, dyspnea, wheezing, chest pain, palpitations, orthopnea, edema, abdominal pain, nausea, melena, diarrhea, constipation, flank pain, dysuria, hematuria, urinary  Frequency, nocturia, numbness, tingling, seizures,  Focal weakness, Loss of consciousness,  Tremor, insomnia, depression, anxiety, and suicidal ideation.     History   Social History  . Marital Status: Widowed    Spouse Name: N/A    Number of Children: 2  . Years of Education: N/A   Occupational History  . 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 Smoking status: Never Smoker   . Smokeless tobacco: Never Used  . Alcohol Use: Yes     Comment: rare  . Drug Use: No  . Sexually Active: Not on file   Other Topics Concern  . Not on file   Social History Narrative   ** Merged History Encounter **       Widowed. Has 2 children and lives alone.    Objective:  BP 146/50  Pulse 58  Temp(Src) 97.6 F (36.4 C) (Oral)  Resp 17  Wt 178 lb 12 oz (81.08 kg)  BMI 34.91 kg/m2  SpO2 96%  General appearance: alert, cooperative and appears stated age Ears: normal TM's and external ear canals both ears Throat: lips, mucosa, and tongue normal; teeth and gums normal Neck: no adenopathy, no carotid bruit, supple, symmetrical, trachea midline and thyroid not enlarged, symmetric, no tenderness/mass/nodules Back: symmetric, no curvature. ROM normal. No CVA tenderness. Lungs: clear to auscultation bilaterally Heart: regular rate and rhythm, S1, S2 normal, no murmur, click, rub or gallop Abdomen: soft, non-tender; bowel sounds normal; no masses,  no organomegaly Pulses: 2+ and symmetric Skin: Skin color, texture, turgor normal. No rashes  or lesions Lymph nodes: Cervical, supraclavicular, and axillary nodes normal.  Assessment and Plan:  Asthma exacerbation Secondary to viral URI.  Ambulatory sats were 96%. She is not using accessory muscles to breathe. She responded well to albuterol neb given here in the office and received a depo medrol IM injection.  Continue prednisone taper and bronchdilator therap with dealer at twice daily and when necessary albuterol. Followup in one week.Marland Kitchen    Updated Medication List Outpatient Encounter Prescriptions as of 10/23/2012  Medication Sig Dispense Refill  . acetaminophen (TYLENOL) 650 MG CR tablet Take 650 mg by mouth every 8 (eight) hours as needed.      . ALPRAZolam (XANAX) 0.5 MG tablet Take 1 tablet (0.5 mg total) by mouth at bedtime as needed for sleep.  30 tablet  3  . amLODipine (NORVASC) 10 MG tablet Take 1 tablet (10 mg total) by mouth daily.  30 tablet  3  . cyanocobalamin ([employer] VITAMIN B12) 2000 MCG tablet Take 1 tablet (2,000 mcg total) by mouth daily.  30 tablet  1  . DiphenhydrAMINE HCl (BENADRYL ALLERGY PO) Take by mouth as needed.      . ergocalciferol (DRISDOL) 50000 UNITS capsule Take 1 capsule (50,000 Units total) by mouth once a week.  4 capsule  3  . furosemide (LASIX) 40 MG tablet One to two tablets daily as needed for fluid retention  80 tablet  2  . gabapentin (NEURONTIN) 100 MG capsule Take 1 capsule (100 mg total) by mouth 3 (three) times daily.  90 capsule  3  . guaiFENesin-codeine (ROBITUSSIN AC) 100-10 MG/5ML syrup Take 10 mLs by mouth 3 (three) times daily as needed for cough.  180 mL  0  . hydrALAZINE (APRESOLINE) 25 MG tablet Take 1 tablet (25 mg total) by mouth 3 (three) times daily.  30 tablet  3  . HYDROcodone-acetaminophen (NORCO/VICODIN) 5-325 MG per tablet Take 1 tablet by mouth every 6 (six) hours as needed for pain.  60 tablet  0  . Lactulose 20 GM/30ML SOLN Take 30 mLs (20 g total) by mouth daily. As needed for constipation , may repeat in 6 hours if  needed  240 mL  0  . levothyroxine (SYNTHROID, LEVOTHROID) 88 MCG tablet Take 1 tablet (88 mcg total) by mouth daily.  30 tablet  3  . loperamide (IMODIUM A-D) 2 MG tablet Take 2 mg by mouth 4 (four) times daily as needed.        Marland Kitchen losartan (COZAAR) 100 MG tablet Take 1 tablet (100 mg total) by mouth daily.  30 tablet  3  . metoprolol (LOPRESSOR) 50 MG tablet Take 1 tablet (50 mg total) by mouth 2 (two) times daily.  60 tablet  5  . mometasone-formoterol (DULERA) 100-5 MCG/ACT AERO Inhale 2 puffs into  the lungs 2 (two) times daily.  13 g  11  . ondansetron (ZOFRAN) 4 MG tablet Take 4 mg by mouth every 4 (four) hours as needed. For nausea and vomiting      . pantoprazole (PROTONIX) 40 MG tablet Take 1 tablet (40 mg total) by mouth 2 (two) times daily before a meal.  60 tablet  1  . sertraline (ZOLOFT) 50 MG tablet Take 1 tablet (50 mg total) by mouth daily.  90 tablet  3  . traMADol (ULTRAM) 50 MG tablet Take 1 tablet (50 mg total) by mouth every 6 (six) hours as needed.  120 tablet  2  . [DISCONTINUED] mometasone-formoterol (DULERA) 100-5 MCG/ACT AERO Inhale 2 puffs into the lungs 2 (two) times daily.      Marland Kitchen albuterol (PROVENTIL HFA;VENTOLIN HFA) 108 (90 BASE) MCG/ACT inhaler Inhale 2 puffs into the lungs every 6 (six) hours as needed for wheezing.  1 Inhaler  11  . amoxicillin (AMOXIL) 500 MG capsule Take 1 capsule (500 mg total) by mouth 3 (three) times daily.  21 capsule  0  . [EXPIRED] albuterol (PROVENTIL) (2.5 MG/3ML) 0.083% nebulizer solution 2.5 mg       . [EXPIRED] methylPREDNISolone acetate (DEPO-MEDROL) injection 40 mg        No facility-administered encounter medications on file as of 10/23/2012.     No orders of the defined types were placed in this encounter.    No Follow-up on file.

## 2012-10-26 ENCOUNTER — Telehealth: Payer: Self-pay | Admitting: Internal Medicine

## 2012-10-26 ENCOUNTER — Encounter: Payer: Self-pay | Admitting: Internal Medicine

## 2012-10-26 ENCOUNTER — Ambulatory Visit (INDEPENDENT_AMBULATORY_CARE_PROVIDER_SITE_OTHER): Payer: Medicare Other | Admitting: Internal Medicine

## 2012-10-26 ENCOUNTER — Ambulatory Visit: Payer: Self-pay | Admitting: Internal Medicine

## 2012-10-26 ENCOUNTER — Ambulatory Visit (INDEPENDENT_AMBULATORY_CARE_PROVIDER_SITE_OTHER)
Admission: RE | Admit: 2012-10-26 | Discharge: 2012-10-26 | Disposition: A | Discharge status: Home / Self Care | Payer: Medicare Other | Source: Ambulatory Visit | Attending: Internal Medicine | Admitting: Internal Medicine

## 2012-10-26 VITALS — BP 96/50 | HR 50 | Temp 98.0°F | Resp 16 | Wt 178.5 lb

## 2012-10-26 DIAGNOSIS — I509 Heart failure, unspecified: Secondary | ICD-10-CM

## 2012-10-26 DIAGNOSIS — R0989 Other specified symptoms and signs involving the circulatory and respiratory systems: Secondary | ICD-10-CM

## 2012-10-26 DIAGNOSIS — I5022 Chronic systolic (congestive) heart failure: Secondary | ICD-10-CM

## 2012-10-26 DIAGNOSIS — R062 Wheezing: Secondary | ICD-10-CM

## 2012-10-26 DIAGNOSIS — R0609 Other forms of dyspnea: Secondary | ICD-10-CM

## 2012-10-26 DIAGNOSIS — J90 Pleural effusion, not elsewhere classified: Secondary | ICD-10-CM

## 2012-10-26 DIAGNOSIS — J45901 Unspecified asthma with (acute) exacerbation: Secondary | ICD-10-CM

## 2012-10-26 MED ORDER — ALBUTEROL SULFATE (2.5 MG/3ML) 0.083% IN NEBU
2.5000 mg | INHALATION_SOLUTION | Freq: Once | RESPIRATORY_TRACT | Status: AC
  Administered 2016-06-06: 2.5 mg via RESPIRATORY_TRACT

## 2012-10-26 MED ORDER — PREDNISONE (PAK) 10 MG PO TABS: ORAL_TABLET | ORAL | Status: DC

## 2012-10-26 MED ORDER — METHYLPREDNISOLONE ACETATE 40 MG/ML IJ SUSP
40.0000 mg | Freq: Once | INTRAMUSCULAR | Status: AC
  Administered 2012-10-26: 40 mg via INTRAMUSCULAR

## 2012-10-26 MED ORDER — ALPRAZOLAM 0.5 MG PO TABS: 0.5000 mg | ORAL_TABLET | Freq: Two times a day (BID) | ORAL | Status: DC | PRN

## 2012-10-26 MED ORDER — ALBUTEROL SULFATE (2.5 MG/3ML) 0.083% IN NEBU
2.5000 mg | INHALATION_SOLUTION | Freq: Once | RESPIRATORY_TRACT | Status: AC
  Administered 2012-10-26: 2.5 mg via RESPIRATORY_TRACT

## 2012-10-26 NOTE — Telephone Encounter (Signed)
Since she is coming in the afternoon,  Have her get a chest x ray at [facility] and see other message about what to do when she gets here

## 2012-10-26 NOTE — Telephone Encounter (Signed)
Patient Information:  Caller Name: Kauri  Phone: [phone]  Patient: Savannah Wilson, Savannah Wilson  Gender: Female  DOB: 22-Dec-1928  Age: 79 Years  PCP: Deborra Medina (Adults only)  Office Follow Up:  Does the office need to follow up with this patient?: Yes  Instructions For The Office: Appt not available on schedule for entire day.  Patient needing to be assessed as soon as possible.  Please review and contact patient for urgent Appt.   Symptoms  Reason For Call & Symptoms: Difficulty breathing and cough since last week.  Seen in office Tues 3/18 and is being treated for asthma, given Prednisone injection, started [facility], ProAir.  Today walking the floor, feels difficulty breathing, at times just feels like can't get her breath. If can sit breathing calms some but still not comfortable  Reviewed Health History In EMR: Yes  Reviewed Medications In EMR: Yes  Reviewed Allergies In EMR: Yes  Reviewed Surgeries / Procedures: Yes  Date of Onset of Symptoms: 10/12/2012  Treatments Tried: ProAir  Treatments Tried Worked: No  Guideline(s) Used:  Breathing Difficulty  Disposition Per Guideline:   Go to Office Now  Reason For Disposition Reached:   Mild difficulty breathing (e.g., minimal/no SOB at rest, SOB with walking, pulse < 100) of new onset or worse than normal  Advice Given:  General Care Advice for Breathing Difficulty:  Find position of greatest comfort. For most patients the best position is semi-upright (e.g., sitting up in a comfortable chair or lying back against pillows).  Create a draft (e.g., use a fan directed at the face, or open a window).  Patient Will Follow Care Advice:  YES

## 2012-10-26 NOTE — Patient Instructions (Addendum)
Increase the lasix to one tablet twice daily for Friday and Saturday.,  Then reduce dose on Sunday and monday to one tablet daily    Start the oral prednisone on Saturday :  60 mg in the morning on Saturday.   50 mg on Sunday, etc  Continue your inhalers as you have been doing  You may take an alprazolam tablet once or twice daily if needed for panicky feelings.        I will see you on Monday afternoon around 2:00

## 2012-10-26 NOTE — Telephone Encounter (Signed)
This pt was worked in today. States she got better then symptoms worsened.

## 2012-10-26 NOTE — Telephone Encounter (Signed)
Pt notified to go to [facility] for [employer]. Also noted on o2 for pt.

## 2012-10-26 NOTE — Telephone Encounter (Signed)
Savannah Wilson I am routing this note to you from call a nurse

## 2012-10-26 NOTE — Telephone Encounter (Signed)
When patient gets here , check her resting 02 sats  If it is > 92% ambulate her and check ambulatorysats,  Then put her in a room and give her a breathing neb of albuterol.

## 2012-10-28 ENCOUNTER — Encounter: Payer: Self-pay | Admitting: Internal Medicine

## 2012-10-28 NOTE — Assessment & Plan Note (Signed)
Complicated by anxiety and chronic small bilateral pleural effusions which were seen on chest x-ray done today. Patient was given a breathing treatment here in the office and her lungs are actually very clear. Received another IM injection of steroids and will continue steroid therapy with prednisone taper over the next 6 days. I also asked her to increase her Lasix to twice daily for 36 hours then once daily until she sees me again on Monday. Small dose of alprazolam prescribed for anxiety.

## 2012-10-28 NOTE — Assessment & Plan Note (Signed)
Given her slight weight gain of 2 pounds over the last week I have increased her Lasix for 36 hours to twice a day. She has stage III chronic kidney disease and will need repeat been done on Monday when she returns.

## 2012-10-28 NOTE — Progress Notes (Signed)
Patient ID: Savannah Wilson, female   DOB: 1929-03-13, 79 y.o.   MRN: [id number]   Patient Active Problem List  Diagnosis  . Asthma  . Depression  . GERD (gastroesophageal reflux disease)  . Venous insufficiency  . Barrett esophagus  . CKD (chronic kidney disease), stage IV  . Systolic CHF, chronic  . Asthma exacerbation  . Hypertension, renal disease  . Restless legs syndrome  . Lower extremity pain, anterior  . Light headed  . Pancreatitis due to biliary obstruction  . Neuropathy  . Vestibular dizziness  . B12 deficiency anemia  . Diverticulitis  . Bradycardia, drug induced  . Renal cysts, acquired, bilateral  . Pleural effusion, bilateral    Subjective:  CC:   Chief Complaint  Patient presents with  . Shortness of Breath    HPI:   Savannah Wilson a 79 y.o. female who presents with persistent shortness of breath. Patient returns for one-week followup for asthma exacerbation. She was treated with a dose of IM steroids and inhaled bronchodilator therapy. She has been using her bronchodilators as directed and felt an initial improvement for a day or 2 but has become progressively more dyspneic as the week has progressed. She is quite anxious  today and is accompanied by her 2 daughters, Savannah Wilson and Savannah Wilson. Done by nurse. Ambulatory sats they were noted to improve from 88 resting to 96% with ambulation. She has had a weight gain of 2 pounds since her last visit. She has been taking her Lasix every other day and her last dose was this morning.   Past Medical History  Diagnosis Date  . Hypothyroid   . Hyperlipidemia   . Diverticulosis   . Peripheral neuralgia   . Hyperkalemia   . Hypertension   . Asthma   . Depression   . GERD (gastroesophageal reflux disease)   . Venous insufficiency   . Barrett esophagus   . Chronic bronchitis   . Diverticular disease   . Renal insufficiency   . Gout   . Pancreatitis due to biliary obstruction March 2010    s/p ERCP  sphincterotomy, cholecystectomy    Past Surgical History  Procedure Laterality Date  . Ventral hernia repair    . Cataract extraction      right  . Cholecystectomy  02/10  . Vesicovaginal fistula closure w/ tah    . Tonsillectomy    . Ventral hernia repair  2007  . Cataract extraction  2008  . Cholecystectomy  2010  . Carpal tunnel release  jan 2013    Savannah Wilson    . albuterol  2.5 mg Nebulization Once     The following portions of the patient's history were reviewed and updated as appropriate: Allergies, current medications, and problem list.    Review of Systems:   12 Pt  review of systems was negative except those addressed in the HPI,     History   Social History  . Marital Status: Widowed    Spouse Name: N/A    Number of Children: 2  . Years of Education: N/A   Occupational History  . 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 Smoking status: Never Smoker   . Smokeless tobacco: Never Used  . Alcohol Use: Yes     Comment: rare  . Drug Use: No  . Sexually Active: Not on file   Other Topics Concern  . Not on file   Social History Narrative   **  Merged History Encounter **       Widowed. Has 2 children and lives alone.    Objective:  BP 96/50  Pulse 50  Temp(Src) 98 F (36.7 C) (Oral)  Resp 16  Wt 178 lb 8 oz (80.967 kg)  BMI 34.86 kg/m2  SpO2 96%  General appearance: alert, cooperative and appears stated age Ears: normal TM's and external ear canals both ears Throat: lips, mucosa, and tongue normal; teeth and gums normal Neck: no adenopathy, no carotid bruit, supple, symmetrical, trachea midline and thyroid not enlarged, symmetric, no tenderness/mass/nodules Back: symmetric, no curvature. ROM normal. No CVA tenderness. Lungs: clear to auscultation bilaterally Heart: regular rate and rhythm, S1, S2 normal, no murmur, click, rub or gallop Abdomen: soft, non-tender; bowel sounds normal; no masses,  no organomegaly Pulses:  2+ and symmetric Skin: Skin color, texture, turgor normal. No rashes or lesions Lymph nodes: Cervical, supraclavicular, and axillary nodes normal.  Assessment and Plan:  Asthma exacerbation Complicated by anxiety and chronic small bilateral pleural effusions which were seen on chest x-ray done today. Patient was given a breathing treatment here in the office and her lungs are actually very clear. Received another IM injection of steroids and will continue steroid therapy with prednisone taper over the next 6 days. I also asked her to increase her Lasix to twice daily for 36 hours then once daily until she sees me again on Monday. Small dose of alprazolam prescribed for anxiety.   Systolic CHF, chronic Given her slight weight gain of 2 pounds over the last week I have increased her Lasix for 36 hours to twice a day. She has stage III chronic kidney disease and will need repeat been done on Monday when she returns.   Updated Medication List Outpatient Encounter Prescriptions as of 10/26/2012  Medication Sig Dispense Refill  . acetaminophen (TYLENOL) 650 MG CR tablet Take 650 mg by mouth every 8 (eight) hours as needed.      Marland Kitchen albuterol (PROVENTIL HFA;VENTOLIN HFA) 108 (90 BASE) MCG/ACT inhaler Inhale 2 puffs into the lungs every 6 (six) hours as needed for wheezing.  1 Inhaler  11  . ALPRAZolam (XANAX) 0.5 MG tablet Take 1 tablet (0.5 mg total) by mouth 2 (two) times daily as needed for anxiety.  30 tablet  3  . amLODipine (NORVASC) 10 MG tablet Take 1 tablet (10 mg total) by mouth daily.  30 tablet  3  . amoxicillin (AMOXIL) 500 MG capsule Take 1 capsule (500 mg total) by mouth 3 (three) times daily.  21 capsule  0  . cyanocobalamin ([employer] VITAMIN B12) 2000 MCG tablet Take 1 tablet (2,000 mcg total) by mouth daily.  30 tablet  1  . DiphenhydrAMINE HCl (BENADRYL ALLERGY PO) Take by mouth as needed.      . ergocalciferol (DRISDOL) 50000 UNITS capsule Take 1 capsule (50,000 Units total) by mouth once  a week.  4 capsule  3  . furosemide (LASIX) 40 MG tablet One to two tablets daily as needed for fluid retention  80 tablet  2  . gabapentin (NEURONTIN) 100 MG capsule Take 1 capsule (100 mg total) by mouth 3 (three) times daily.  90 capsule  3  . guaiFENesin-codeine (ROBITUSSIN AC) 100-10 MG/5ML syrup Take 10 mLs by mouth 3 (three) times daily as needed for cough.  180 mL  0  . hydrALAZINE (APRESOLINE) 25 MG tablet Take 1 tablet (25 mg total) by mouth 3 (three) times daily.  30 tablet  3  .  HYDROcodone-acetaminophen (NORCO/VICODIN) 5-325 MG per tablet Take 1 tablet by mouth every 6 (six) hours as needed for pain.  60 tablet  0  . Lactulose 20 GM/30ML SOLN Take 30 mLs (20 g total) by mouth daily. As needed for constipation , may repeat in 6 hours if needed  240 mL  0  . levothyroxine (SYNTHROID, LEVOTHROID) 88 MCG tablet Take 1 tablet (88 mcg total) by mouth daily.  30 tablet  3  . loperamide (IMODIUM A-D) 2 MG tablet Take 2 mg by mouth 4 (four) times daily as needed.        Marland Kitchen losartan (COZAAR) 100 MG tablet Take 1 tablet (100 mg total) by mouth daily.  30 tablet  3  . metoprolol (LOPRESSOR) 50 MG tablet Take 1 tablet (50 mg total) by mouth 2 (two) times daily.  60 tablet  5  . mometasone-formoterol (DULERA) 100-5 MCG/ACT AERO Inhale 2 puffs into the lungs 2 (two) times daily.  13 g  11  . ondansetron (ZOFRAN) 4 MG tablet Take 4 mg by mouth every 4 (four) hours as needed. For nausea and vomiting      . pantoprazole (PROTONIX) 40 MG tablet Take 1 tablet (40 mg total) by mouth 2 (two) times daily before a meal.  60 tablet  1  . sertraline (ZOLOFT) 50 MG tablet Take 1 tablet (50 mg total) by mouth daily.  90 tablet  3  . traMADol (ULTRAM) 50 MG tablet Take 1 tablet (50 mg total) by mouth every 6 (six) hours as needed.  120 tablet  2  . [DISCONTINUED] ALPRAZolam (XANAX) 0.5 MG tablet Take 1 tablet (0.5 mg total) by mouth at bedtime as needed for sleep.  30 tablet  3  . predniSONE (STERAPRED UNI-PAK) 10  MG tablet 6 tablets on Day 1 , then reduce by 1 tablet daily until gone  21 tablet  0   Facility-Administered Encounter Medications as of 10/26/2012  Medication Dose Route Frequency Provider Last Rate Last Dose  . [COMPLETED] albuterol (PROVENTIL) (2.5 MG/3ML) 0.083% nebulizer solution 2.5 mg  2.5 mg Nebulization Once [name], MD   2.5 mg at 10/26/12 1604  . albuterol (PROVENTIL) (2.5 MG/3ML) 0.083% nebulizer solution 2.5 mg  2.5 mg Nebulization Once [name], MD      . Margrett Rud methylPREDNISolone acetate (DEPO-MEDROL) injection 40 mg  40 mg Intramuscular Once [name], MD   40 mg at 10/26/12 1646     No orders of the defined types were placed in this encounter.    No Follow-up on file.

## 2012-10-29 ENCOUNTER — Ambulatory Visit (INDEPENDENT_AMBULATORY_CARE_PROVIDER_SITE_OTHER): Payer: Medicare Other | Admitting: Internal Medicine

## 2012-10-29 ENCOUNTER — Encounter: Payer: Self-pay | Admitting: Internal Medicine

## 2012-10-29 VITALS — BP 124/68 | HR 50 | Temp 97.9°F | Resp 16 | Wt 178.2 lb

## 2012-10-29 DIAGNOSIS — Z79899 Other long term (current) drug therapy: Secondary | ICD-10-CM

## 2012-10-29 DIAGNOSIS — N184 Chronic kidney disease, stage 4 (severe): Secondary | ICD-10-CM

## 2012-10-29 DIAGNOSIS — J45901 Unspecified asthma with (acute) exacerbation: Secondary | ICD-10-CM

## 2012-10-29 NOTE — Progress Notes (Signed)
Patient ID: Savannah Wilson, female   DOB: 1928/10/05, 79 y.o.   MRN: [id number]  Patient Active Problem List  Diagnosis  . Asthma  . Depression  . GERD (gastroesophageal reflux disease)  . Venous insufficiency  . Barrett esophagus  . CKD (chronic kidney disease), stage IV  . Systolic CHF, chronic  . Asthma exacerbation  . Hypertension, renal disease  . Restless legs syndrome  . Lower extremity pain, anterior  . Light headed  . Pancreatitis due to biliary obstruction  . Neuropathy  . Vestibular dizziness  . B12 deficiency anemia  . Diverticulitis  . Bradycardia, drug induced  . Renal cysts, acquired, bilateral  . Pleural effusion, bilateral    Subjective:  CC:   Chief Complaint  Patient presents with  . Follow-up    HPI:   Savannah Wilson a 79 y.o. female who presents 3 day follow up on recent asthma exacerbation.   Patient was placed on a prednisone taper after receiving an IM injection of Depo-Medrol. She was also advised to increase her Lasix to twice daily for 36 hours. She states that she is markedly better and no longer short of breath. Her last use of albuterol inhaler was 4 hours ago. She has a follow up nephrology appt scheduled for may.     Past Medical History  Diagnosis Date  . Hypothyroid   . Hyperlipidemia   . Diverticulosis   . Peripheral neuralgia   . Hyperkalemia   . Hypertension   . Asthma   . Depression   . GERD (gastroesophageal reflux disease)   . Venous insufficiency   . Barrett esophagus   . Chronic bronchitis   . Diverticular disease   . Renal insufficiency   . Gout   . Pancreatitis due to biliary obstruction March 2010    s/p ERCP sphincterotomy, cholecystectomy    Past Surgical History  Procedure Laterality Date  . Ventral hernia repair    . Cataract extraction      right  . Cholecystectomy  02/10  . Vesicovaginal fistula closure w/ tah    . Tonsillectomy    . Ventral hernia repair  2007  . Cataract extraction  2008  .  Cholecystectomy  2010  . Carpal tunnel release  jan 2013    [name]    . albuterol  2.5 mg Nebulization Once     The following portions of the patient's history were reviewed and updated as appropriate: Allergies, current medications, and problem list.    Review of Systems:   Patient denies headache, fevers, malaise, unintentional weight loss, skin rash, eye pain, sinus congestion and sinus pain, sore throat, dysphagia,  hemoptysis , cough, dyspnea, wheezing, chest pain, palpitations, orthopnea, edema, abdominal pain, nausea, melena, diarrhea, constipation, flank pain, dysuria, hematuria, urinary  Frequency, nocturia, numbness, tingling, seizures,  Focal weakness, Loss of consciousness,  Tremor, insomnia, depression, anxiety, and suicidal ideation.     History   Social History  . Marital Status: Widowed    Spouse Name: N/A    Number of Children: 2  . Years of Education: N/A   Occupational History  . 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 Smoking status: Never Smoker   . Smokeless tobacco: Never Used  . Alcohol Use: Yes     Comment: rare  . Drug Use: No  . Sexually Active: Not on file   Other Topics Concern  . Not on file   Social History Narrative   **  Merged History Encounter **       Widowed. Has 2 children and lives alone.    Objective:  BP 124/68  Pulse 50  Temp(Src) 97.9 F (36.6 C) (Oral)  Resp 16  Wt 178 lb 4 oz (80.854 kg)  BMI 34.81 kg/m2  SpO2 97%  General appearance: alert, cooperative and appears stated age Ears: normal TM's and external ear canals both ears Throat: lips, mucosa, and tongue normal; teeth and gums normal Neck: no adenopathy, no carotid bruit, supple, symmetrical, trachea midline and thyroid not enlarged, symmetric, no tenderness/mass/nodules Back: symmetric, no curvature. ROM normal. No CVA tenderness. Lungs: clear to auscultation bilaterally Heart: regular rate and rhythm, S1, S2 normal, no murmur,  click, rub or gallop Abdomen: soft, non-tender; bowel sounds normal; no masses,  no organomegaly Pulses: 2+ and symmetric Skin: Skin color, texture, decreased LE edema, turgor normal. No rashes or lesions Lymph nodes: Cervical, supraclavicular, and axillary nodes normal.  Assessment and Plan:  Asthma exacerbation Currently resolving well with prednisone taper. Continue use of steroids as well as inhaled and systemic  CKD (chronic kidney disease), stage IV Secondary to polycystic disease. Patient is followed by Dr. Zollie Scale. Repeat kidney function is stable despite recent increase in furosemide dose.   Updated Medication List Outpatient Encounter Prescriptions as of 10/29/2012  Medication Sig Dispense Refill  . acetaminophen (TYLENOL) 650 MG CR tablet Take 650 mg by mouth every 8 (eight) hours as needed.      Marland Kitchen albuterol (PROVENTIL HFA;VENTOLIN HFA) 108 (90 BASE) MCG/ACT inhaler Inhale 2 puffs into the lungs every 6 (six) hours as needed for wheezing.  1 Inhaler  11  . ALPRAZolam (XANAX) 0.5 MG tablet Take 1 tablet (0.5 mg total) by mouth 2 (two) times daily as needed for anxiety.  30 tablet  3  . amLODipine (NORVASC) 10 MG tablet Take 1 tablet (10 mg total) by mouth daily.  30 tablet  3  . amoxicillin (AMOXIL) 500 MG capsule Take 1 capsule (500 mg total) by mouth 3 (three) times daily.  21 capsule  0  . cyanocobalamin ([facility] VITAMIN B12) 2000 MCG tablet Take 1 tablet (2,000 mcg total) by mouth daily.  30 tablet  1  . DiphenhydrAMINE HCl (BENADRYL ALLERGY PO) Take by mouth as needed.      . ergocalciferol (DRISDOL) 50000 UNITS capsule Take 1 capsule (50,000 Units total) by mouth once a week.  4 capsule  3  . furosemide (LASIX) 40 MG tablet One to two tablets daily as needed for fluid retention  80 tablet  2  . gabapentin (NEURONTIN) 100 MG capsule Take 1 capsule (100 mg total) by mouth 3 (three) times daily.  90 capsule  3  . guaiFENesin-codeine (ROBITUSSIN AC) 100-10 MG/5ML syrup Take 10 mLs by  mouth 3 (three) times daily as needed for cough.  180 mL  0  . hydrALAZINE (APRESOLINE) 25 MG tablet Take 1 tablet (25 mg total) by mouth 3 (three) times daily.  30 tablet  3  . HYDROcodone-acetaminophen (NORCO/VICODIN) 5-325 MG per tablet Take 1 tablet by mouth every 6 (six) hours as needed for pain.  60 tablet  0  . Lactulose 20 GM/30ML SOLN Take 30 mLs (20 g total) by mouth daily. As needed for constipation , may repeat in 6 hours if needed  240 mL  0  . levothyroxine (SYNTHROID, LEVOTHROID) 88 MCG tablet Take 1 tablet (88 mcg total) by mouth daily.  30 tablet  3  . loperamide (IMODIUM A-D) 2  MG tablet Take 2 mg by mouth 4 (four) times daily as needed.        Marland Kitchen losartan (COZAAR) 100 MG tablet Take 1 tablet (100 mg total) by mouth daily.  30 tablet  3  . metoprolol (LOPRESSOR) 50 MG tablet Take 1 tablet (50 mg total) by mouth 2 (two) times daily.  60 tablet  5  . mometasone-formoterol (DULERA) 100-5 MCG/ACT AERO Inhale 2 puffs into the lungs 2 (two) times daily.  13 g  11  . ondansetron (ZOFRAN) 4 MG tablet Take 4 mg by mouth every 4 (four) hours as needed. For nausea and vomiting      . pantoprazole (PROTONIX) 40 MG tablet Take 1 tablet (40 mg total) by mouth 2 (two) times daily before a meal.  60 tablet  1  . predniSONE (STERAPRED UNI-PAK) 10 MG tablet 6 tablets on Day 1 , then reduce by 1 tablet daily until gone  21 tablet  0  . sertraline (ZOLOFT) 50 MG tablet Take 1 tablet (50 mg total) by mouth daily.  90 tablet  3  . traMADol (ULTRAM) 50 MG tablet Take 1 tablet (50 mg total) by mouth every 6 (six) hours as needed.  120 tablet  2   Facility-Administered Encounter Medications as of 10/29/2012  Medication Dose Route Frequency Provider Last Rate Last Dose  . albuterol (PROVENTIL) (2.5 MG/3ML) 0.083% nebulizer solution 2.5 mg  2.5 mg Nebulization Once [name], MD         Orders Placed This Encounter  Procedures  . Basic metabolic panel    Return in about 3 months (around  01/29/2013).

## 2012-10-30 ENCOUNTER — Ambulatory Visit: Payer: Self-pay | Admitting: Internal Medicine

## 2012-10-30 ENCOUNTER — Encounter: Payer: Self-pay | Admitting: Internal Medicine

## 2012-10-30 ENCOUNTER — Other Ambulatory Visit (INDEPENDENT_AMBULATORY_CARE_PROVIDER_SITE_OTHER): Payer: Medicare Other

## 2012-10-30 DIAGNOSIS — Z79899 Other long term (current) drug therapy: Secondary | ICD-10-CM

## 2012-10-30 LAB — BASIC METABOLIC PANEL
BUN: 43 mg/dL — ABNORMAL HIGH (ref 6–23)
CO2: 27 mEq/L (ref 19–32)
Chloride: 104 mEq/L (ref 96–112)
Glucose, Bld: 102 mg/dL — ABNORMAL HIGH (ref 70–99)
Potassium: 4.6 mEq/L (ref 3.5–5.1)

## 2012-10-30 NOTE — Assessment & Plan Note (Signed)
Secondary to polycystic disease. Patient is followed by Dr. Zollie Scale. Repeat kidney function is stable despite recent increase in furosemide dose.

## 2012-10-30 NOTE — Assessment & Plan Note (Signed)
Currently resolving well with prednisone taper. Continue use of steroids as well as inhaled and systemic

## 2012-11-01 ENCOUNTER — Other Ambulatory Visit: Payer: Self-pay | Admitting: *Deleted

## 2012-11-01 MED ORDER — PANTOPRAZOLE SODIUM 40 MG PO TBEC: 40.0000 mg | DELAYED_RELEASE_TABLET | Freq: Two times a day (BID) | ORAL | Status: DC

## 2012-11-01 NOTE — Telephone Encounter (Signed)
Med filled.  

## 2012-11-09 ENCOUNTER — Telehealth: Payer: Self-pay | Admitting: General Practice

## 2012-11-09 NOTE — Telephone Encounter (Signed)
If she is noticing fluid retention and weight gain she should resume it once daily and follow her weights

## 2012-11-09 NOTE — Telephone Encounter (Signed)
Pt called wanting to know when she could resume her Lasix.Please advise.

## 2012-11-15 ENCOUNTER — Telehealth: Payer: Self-pay | Admitting: Internal Medicine

## 2012-11-15 MED ORDER — METOPROLOL TARTRATE 50 MG PO TABS: 50.0000 mg | ORAL_TABLET | Freq: Two times a day (BID) | ORAL | Status: DC

## 2012-11-15 NOTE — Telephone Encounter (Signed)
metoprolol (LOPRESSOR) 50 MG tablet  #60

## 2012-11-15 NOTE — Telephone Encounter (Signed)
Med filled.  

## 2012-11-19 ENCOUNTER — Other Ambulatory Visit: Payer: Self-pay | Admitting: *Deleted

## 2012-11-19 MED ORDER — LEVOTHYROXINE SODIUM 88 MCG PO TABS: 88.0000 ug | ORAL_TABLET | Freq: Every day | ORAL | Status: DC

## 2012-12-20 ENCOUNTER — Ambulatory Visit (INDEPENDENT_AMBULATORY_CARE_PROVIDER_SITE_OTHER): Payer: Medicare Other | Admitting: Adult Health

## 2012-12-20 ENCOUNTER — Encounter: Payer: Self-pay | Admitting: Adult Health

## 2012-12-20 VITALS — BP 122/62 | HR 66 | Temp 97.8°F | Resp 14 | Wt 176.0 lb

## 2012-12-20 DIAGNOSIS — J329 Chronic sinusitis, unspecified: Secondary | ICD-10-CM

## 2012-12-20 MED ORDER — AMOXICILLIN-POT CLAVULANATE 875-125 MG PO TABS: 1.0000 | ORAL_TABLET | Freq: Two times a day (BID) | ORAL | Status: DC

## 2012-12-20 MED ORDER — GUAIFENESIN-CODEINE 100-10 MG/5ML PO SYRP: 5.0000 mL | ORAL_SOLUTION | Freq: Three times a day (TID) | ORAL | Status: DC | PRN

## 2012-12-20 NOTE — Assessment & Plan Note (Signed)
Start Augmentin twice a day x7 days. Robitussin AC for severe cough. May take Tylenol for general aches and pains and fever.

## 2012-12-20 NOTE — Patient Instructions (Addendum)
Start the antibiotic today.  Cough syrup with codeine at bedtime.   What is sinusitis? - Sinusitis is a condition that can cause a stuffy nose, pain in the face, and yellow or green discharge (mucus) from the nose. The sinuses are hollow areas in the bones of the face (figure 1). They have a thin lining that normally makes a small amount of mucus. When this lining gets infected, it swells and makes extra mucus. This causes symptoms.   Sinusitis can occur when a person gets sick with a cold. The germs causing the cold can also infect the sinuses. Many times, a person feels like his or her cold is getting better. But then he or she gets sinusitis and begins to feel sick again.  What are the symptoms of sinusitis? - Common symptoms of sinusitis include: Stuffy or blocked nose  Thick yellow or green discharge from the nose  Pain in the teeth  Pain or pressure in the face - This often feels worse when a person bends forward.   People with sinusitis can also have other symptoms that include: Fever  Cough  Trouble smelling  Ear pressure or fullness  Headache  Bad breath  Feeling tired   Most of the time, symptoms start to improve in 7 to 10 days.  Should I see a doctor or nurse? - See your doctor or nurse if your symptoms last more than 7 days, or if your symptoms get better at first but then get worse. Sometimes, sinusitis can lead to serious problems. See your doctor or nurse right away (do not wait 7 days) if you have: Fever higher than 102.15F (39.2C)  Sudden and severe pain in the face and head  Trouble seeing or seeing double  Trouble thinking clearly  Swelling or redness around 1 or both eyes  Trouble breathing or a stiff neck   Is there anything I can do on my own to feel better? - Yes. To reduce your symptoms, you can: Take an over-the-counter pain reliever to reduce the pain  Rinse your nose and sinuses with salt water a few times a day - Ask your doctor or nurse about  the best way to do this.  Use a decongestant nose spray - These sprays are sold in a pharmacy. But do not use decongestant nose sprays for more than 2 to 3 days in a row. Using them more than 3 days in a row can make symptoms worse.   You should NOT take an antihistamine for sinusitis. Common antihistamines include diphenhydramine (sample brand name: Benadryl), chlorpheniramine (sample brand name: Chlor-Trimeton), loratadine (sample brand name: Claritin), and cetirizine (sample brand name: Zyrtec). They can treat allergies, but not sinus infections, and could increase your discomfort by drying the lining of your nose and sinuses, or making you tired.   Your doctor might also prescribe a steroid nose spray to reduce the swelling in your nose. (Steroid nose sprays do not contain the same steroids that athletes take to build muscle.)  How is sinusitis treated? - Most of the time, sinusitis does not need to be treated with antibiotic medicines. This is because most sinusitis is caused by viruses - not bacteria - and antibiotics do not kill viruses. Many people get over sinus infections without antibiotics.  Some people with sinusitis do need treatment with antibiotics. If your symptoms have not improved after 7 to 10 days, ask your doctor if you should take antibiotics. Your doctor might recommend that you wait 1  more week to see if your symptoms improve. But if you have symptoms such as a fever or a lot of pain, he or she might prescribe antibiotics. It is important to follow your doctor's instructions about taking your antibiotics.  What if my symptoms do not get better? - If your symptoms do not get better, talk with your doctor or nurse. He or she might order tests to figure out why you still have symptoms. These can include:  CT scan or other imaging tests - Imaging tests create pictures of the inside of the body.  A test to look inside the sinuses - For this test, a doctor puts a thin tube with a  camera on the end into the nose and up into the sinuses.   Some people get a lot of sinus infections or have symptoms that last at least 3 months. These people can have a different type of sinusitis called "chronic sinusitis." Chronic sinusitis can be caused by different things. For example, some people have growths in their nose or sinuses that are called "polyps." Other people have allergies that cause their symptoms.  Chronic sinusitis can be treated in different ways. If you have chronic sinusitis, talk with your doctor about which treatments are right for you.

## 2012-12-20 NOTE — Progress Notes (Signed)
Subjective:    Patient ID: Savannah Wilson, female    DOB: 01/04/1929, 79 y.o.   MRN: [id number]  HPI  Patient is a pleasant 79 year old female who presents to clinic with complaints of cough, congestion, fever x2 days. She has tried a few over-the-counter medications for her symptoms however these have not been effective. She denies shortness of breath or wheezing.   Current Outpatient Prescriptions on File Prior to Visit  Medication Sig Dispense Refill  . acetaminophen (TYLENOL) 650 MG CR tablet Take 650 mg by mouth every 8 (eight) hours as needed.      Marland Kitchen albuterol (PROVENTIL HFA;VENTOLIN HFA) 108 (90 BASE) MCG/ACT inhaler Inhale 2 puffs into the lungs every 6 (six) hours as needed for wheezing.  1 Inhaler  11  . ALPRAZolam (XANAX) 0.5 MG tablet Take 1 tablet (0.5 mg total) by mouth 2 (two) times daily as needed for anxiety.  30 tablet  3  . amLODipine (NORVASC) 10 MG tablet Take 1 tablet (10 mg total) by mouth daily.  30 tablet  3  . cyanocobalamin ([employer] VITAMIN B12) 2000 MCG tablet Take 1 tablet (2,000 mcg total) by mouth daily.  30 tablet  1  . DiphenhydrAMINE HCl (BENADRYL ALLERGY PO) Take by mouth as needed.      . ergocalciferol (DRISDOL) 50000 UNITS capsule Take 1 capsule (50,000 Units total) by mouth once a week.  4 capsule  3  . furosemide (LASIX) 40 MG tablet One to two tablets daily as needed for fluid retention  80 tablet  2  . gabapentin (NEURONTIN) 100 MG capsule Take 1 capsule (100 mg total) by mouth 3 (three) times daily.  90 capsule  3  . hydrALAZINE (APRESOLINE) 25 MG tablet Take 1 tablet (25 mg total) by mouth 3 (three) times daily.  30 tablet  3  . HYDROcodone-acetaminophen (NORCO/VICODIN) 5-325 MG per tablet Take 1 tablet by mouth every 6 (six) hours as needed for pain.  60 tablet  0  . Lactulose 20 GM/30ML SOLN Take 30 mLs (20 g total) by mouth daily. As needed for constipation , may repeat in 6 hours if needed  240 mL  0  . levothyroxine (SYNTHROID, LEVOTHROID) 88 MCG  tablet Take 1 tablet (88 mcg total) by mouth daily.  30 tablet  3  . loperamide (IMODIUM A-D) 2 MG tablet Take 2 mg by mouth 4 (four) times daily as needed.        Marland Kitchen losartan (COZAAR) 100 MG tablet Take 1 tablet (100 mg total) by mouth daily.  30 tablet  3  . metoprolol (LOPRESSOR) 50 MG tablet Take 1 tablet (50 mg total) by mouth 2 (two) times daily.  60 tablet  5  . mometasone-formoterol (DULERA) 100-5 MCG/ACT AERO Inhale 2 puffs into the lungs 2 (two) times daily.  13 g  11  . ondansetron (ZOFRAN) 4 MG tablet Take 4 mg by mouth every 4 (four) hours as needed. For nausea and vomiting      . pantoprazole (PROTONIX) 40 MG tablet Take 1 tablet (40 mg total) by mouth 2 (two) times daily before a meal.  60 tablet  1  . sertraline (ZOLOFT) 50 MG tablet Take 1 tablet (50 mg total) by mouth daily.  90 tablet  3  . traMADol (ULTRAM) 50 MG tablet Take 1 tablet (50 mg total) by mouth every 6 (six) hours as needed.  120 tablet  2   Current Facility-Administered Medications on File Prior to Visit  Medication Dose Route  Frequency Provider Last Rate Last Dose  . albuterol (PROVENTIL) (2.5 MG/3ML) 0.083% nebulizer solution 2.5 mg  2.5 mg Nebulization Once [name], MD          Review of Systems  Constitutional: Positive for fever and chills.  HENT: Positive for congestion, sore throat, rhinorrhea, postnasal drip and sinus pressure.   Respiratory: Positive for cough. Negative for shortness of breath and wheezing.   Cardiovascular: Negative for chest pain.     BP 122/62  Pulse 66  Temp(Src) 97.8 F (36.6 C) (Oral)  Resp 14  Wt 176 lb (79.833 kg)  BMI 34.37 kg/m2  SpO2 97%    Objective:   Physical Exam  Constitutional: She is oriented to person, place, and time. She appears well-developed and well-nourished.  Ill appearing  Cardiovascular: Normal rate, regular rhythm and normal heart sounds.  Exam reveals no gallop and no friction rub.   No murmur heard. Pulmonary/Chest: Effort normal  and breath sounds normal. No respiratory distress. She has no wheezes. She has no rales.  Neurological: She is alert and oriented to person, place, and time.  Psychiatric: She has a normal mood and affect. Her behavior is normal. Judgment and thought content normal.          Assessment & Plan:

## 2013-01-01 ENCOUNTER — Encounter: Payer: Self-pay | Admitting: Internal Medicine

## 2013-01-01 ENCOUNTER — Ambulatory Visit (INDEPENDENT_AMBULATORY_CARE_PROVIDER_SITE_OTHER): Payer: Medicare Other | Admitting: Internal Medicine

## 2013-01-01 VITALS — BP 120/60 | HR 53 | Temp 97.7°F | Ht 60.0 in | Wt 176.2 lb

## 2013-01-01 DIAGNOSIS — J069 Acute upper respiratory infection, unspecified: Secondary | ICD-10-CM

## 2013-01-01 MED ORDER — CEFDINIR 300 MG PO CAPS: 300.0000 mg | ORAL_CAPSULE | Freq: Two times a day (BID) | ORAL | Status: DC

## 2013-01-01 MED ORDER — PREDNISONE 10 MG PO TABS: ORAL_TABLET | ORAL | Status: DC

## 2013-01-01 NOTE — Patient Instructions (Signed)
I am going to give you an antibiotic (omnicef) - one tablet 2x/day.  I am also going to give you a prednisone taper - take as directed.  Use the inhalers as instructed.  Robitussin or robitussin DM as directed.

## 2013-01-02 ENCOUNTER — Encounter: Payer: Self-pay | Admitting: Internal Medicine

## 2013-01-02 NOTE — Progress Notes (Signed)
Subjective:    Patient ID: Savannah Wilson, female    DOB: [birth year], 79 y.o.   MRN: [id number]  HPI 79 year old female with past history of asthma, GERD, hypertension and chronic kidney disease who comes in today as a work in with concerns regarding persistent chest congestion and cough.  She was evaluated by Raquel on 12/20/12.  Diagnosed with sinusitis.  Treated with augmentin for seven days.  States she is having persistent symptoms.  Some sinus congestion and increased drainage.  No sore throat now.  Increased chest congestion and cough.  Some intermittent wheezing and sob.  Not using her inhalers daily.  Has been taking robitussin AC.  No vomiting.     Past Medical History  Diagnosis Date  . Hypothyroid   . Hyperlipidemia   . Diverticulosis   . Peripheral neuralgia   . Hyperkalemia   . Hypertension   . Asthma   . Depression   . GERD (gastroesophageal reflux disease)   . Venous insufficiency   . Barrett esophagus   . Chronic bronchitis   . Diverticular disease   . Renal insufficiency   . Gout   . Pancreatitis due to biliary obstruction March 2010    s/p ERCP sphincterotomy, cholecystectomy    Current Outpatient Prescriptions on File Prior to Visit  Medication Sig Dispense Refill  . acetaminophen (TYLENOL) 650 MG CR tablet Take 650 mg by mouth every 8 (eight) hours as needed.      Marland Kitchen albuterol (PROVENTIL HFA;VENTOLIN HFA) 108 (90 BASE) MCG/ACT inhaler Inhale 2 puffs into the lungs every 6 (six) hours as needed for wheezing.  1 Inhaler  11  . ALPRAZolam (XANAX) 0.5 MG tablet Take 1 tablet (0.5 mg total) by mouth 2 (two) times daily as needed for anxiety.  30 tablet  3  . amLODipine (NORVASC) 10 MG tablet Take 1 tablet (10 mg total) by mouth daily.  30 tablet  3  . cyanocobalamin ([employer] VITAMIN B12) 2000 MCG tablet Take 1 tablet (2,000 mcg total) by mouth daily.  30 tablet  1  . DiphenhydrAMINE HCl (BENADRYL ALLERGY PO) Take by mouth as needed.      . ergocalciferol (DRISDOL) 50000  UNITS capsule Take 1 capsule (50,000 Units total) by mouth once a week.  4 capsule  3  . furosemide (LASIX) 40 MG tablet One to two tablets daily as needed for fluid retention  80 tablet  2  . gabapentin (NEURONTIN) 100 MG capsule Take 1 capsule (100 mg total) by mouth 3 (three) times daily.  90 capsule  3  . guaiFENesin-codeine (ROBITUSSIN AC) 100-10 MG/5ML syrup Take 5 mLs by mouth 3 (three) times daily as needed for cough.  120 mL  0  . hydrALAZINE (APRESOLINE) 25 MG tablet Take 1 tablet (25 mg total) by mouth 3 (three) times daily.  30 tablet  3  . HYDROcodone-acetaminophen (NORCO/VICODIN) 5-325 MG per tablet Take 1 tablet by mouth every 6 (six) hours as needed for pain.  60 tablet  0  . Lactulose 20 GM/30ML SOLN Take 30 mLs (20 g total) by mouth daily. As needed for constipation , may repeat in 6 hours if needed  240 mL  0  . levothyroxine (SYNTHROID, LEVOTHROID) 88 MCG tablet Take 1 tablet (88 mcg total) by mouth daily.  30 tablet  3  . loperamide (IMODIUM A-D) 2 MG tablet Take 2 mg by mouth 4 (four) times daily as needed.        Marland Kitchen losartan (COZAAR) 100  MG tablet Take 1 tablet (100 mg total) by mouth daily.  30 tablet  3  . metoprolol (LOPRESSOR) 50 MG tablet Take 1 tablet (50 mg total) by mouth 2 (two) times daily.  60 tablet  5  . mometasone-formoterol (DULERA) 100-5 MCG/ACT AERO Inhale 2 puffs into the lungs 2 (two) times daily.  13 g  11  . ondansetron (ZOFRAN) 4 MG tablet Take 4 mg by mouth every 4 (four) hours as needed. For nausea and vomiting      . pantoprazole (PROTONIX) 40 MG tablet Take 1 tablet (40 mg total) by mouth 2 (two) times daily before a meal.  60 tablet  1  . sertraline (ZOLOFT) 50 MG tablet Take 1 tablet (50 mg total) by mouth daily.  90 tablet  3  . traMADol (ULTRAM) 50 MG tablet Take 1 tablet (50 mg total) by mouth every 6 (six) hours as needed.  120 tablet  2   Current Facility-Administered Medications on File Prior to Visit  Medication Dose Route Frequency Provider  Last Rate Last Dose  . albuterol (PROVENTIL) (2.5 MG/3ML) 0.083% nebulizer solution 2.5 mg  2.5 mg Nebulization Once [name], MD        Review of Systems Patient denies any headache, lightheadedness or dizziness.  Some sinus congestion.  Increased post nasal drainage.  No sore throat now.  No chest pain, tightness or palpitations.  Some increased chest congestion and cough.  Some wheezing.  Not using her inhalers regularly.  No acid reflux.  No nausea or vomiting.  No bowel change, such as diarrhea.  Has previously taken and tolerated prednisone.          Objective:   Physical Exam Filed Vitals:   01/01/13 0958  BP: 120/60  Pulse: 53  Temp: 97.7 F (36.5 C)   Pulse recheck: 94 79 year old female in no acute distress.   HEENT:  Nares- slightly erythematous turbinates.  Oropharynx - without lesions.  No significant tenderness to palpation over the sinuses.   NECK:  Supple.  Nontender.  HEART:  Appears to be regular. LUNGS:  No crackles or wheezing audible.  Increased cough with forced expiration.   RADIAL PULSE:  Equal bilaterally.          Assessment & Plan:  URI/POSSIBLE SINUSITIS.  Previously treated with augmentin.  Now with increased cough, chest congestion and wheezing.  Exam as outlined.  Will treat with omnicef 300mg  x 10 days.  Instructed her to use her steroid inhaler bid -scheduled and the rescue inhaler as directed.  Plain robitussin.  Prednisone taper starting at 60mg  and decreasing by 5mg  each day until off.  Saline nasal spray and nasonex as directed.  Rest.  Fluids.  Explained to her if symptoms change, worsen or do not resolve - she is to be reevaluated.  F/u with Dr Derrel Nip as directed.

## 2013-01-02 NOTE — Assessment & Plan Note (Signed)
Blood pressure controlled. 

## 2013-01-03 ENCOUNTER — Other Ambulatory Visit: Payer: Self-pay | Admitting: *Deleted

## 2013-01-04 MED ORDER — PANTOPRAZOLE SODIUM 40 MG PO TBEC: 40.0000 mg | DELAYED_RELEASE_TABLET | Freq: Two times a day (BID) | ORAL | Status: DC

## 2013-01-09 ENCOUNTER — Ambulatory Visit (INDEPENDENT_AMBULATORY_CARE_PROVIDER_SITE_OTHER): Payer: Medicare Other | Admitting: Internal Medicine

## 2013-01-09 ENCOUNTER — Encounter: Payer: Self-pay | Admitting: Internal Medicine

## 2013-01-09 VITALS — BP 142/68 | HR 57 | Temp 98.4°F | Resp 18 | Wt 179.0 lb

## 2013-01-09 DIAGNOSIS — J309 Allergic rhinitis, unspecified: Secondary | ICD-10-CM

## 2013-01-09 DIAGNOSIS — E039 Hypothyroidism, unspecified: Secondary | ICD-10-CM

## 2013-01-09 DIAGNOSIS — R0989 Other specified symptoms and signs involving the circulatory and respiratory systems: Secondary | ICD-10-CM

## 2013-01-09 DIAGNOSIS — Z79899 Other long term (current) drug therapy: Secondary | ICD-10-CM

## 2013-01-09 DIAGNOSIS — D638 Anemia in other chronic diseases classified elsewhere: Secondary | ICD-10-CM

## 2013-01-09 MED ORDER — BECLOMETHASONE DIPROPIONATE 80 MCG/ACT NA AERS: 2.0000 | INHALATION_SPRAY | Freq: Every day | NASAL | Status: DC

## 2013-01-09 MED ORDER — HYDROCOD POLST-CHLORPHEN POLST 10-8 MG/5ML PO LQCR: 5.0000 mL | Freq: Two times a day (BID) | ORAL | Status: DC | PRN

## 2013-01-09 NOTE — Progress Notes (Signed)
Patient ID: Savannah Wilson, female   DOB: Apr 14, 1929, 79 y.o.   MRN: [id number]   Patient Active Problem List   Diagnosis Date Noted  . Allergic rhinitis 01/11/2013  . Dyspnea on exertion 01/11/2013  . Bradycardia, drug induced 08/26/2012  . Renal cysts, acquired, bilateral 08/26/2012  . Pleural effusion, bilateral 08/26/2012  . Diverticulitis 08/21/2012  . B12 deficiency anemia 06/24/2012  . Neuropathy 05/22/2012  . Vestibular dizziness 05/22/2012  . History of pancreatitis   . Light headed 03/16/2012  . Lower extremity pain, anterior 12/15/2011  . Hypertension, renal disease 09/15/2011  . Restless legs syndrome 09/15/2011  . Systolic CHF, chronic 0000000  . Asthma   . Depression   . GERD (gastroesophageal reflux disease)   . Venous insufficiency   . Barrett esophagus   . CKD (chronic kidney disease), stage IV     Subjective:  CC:   Chief Complaint  Patient presents with  . Follow-up    HPI:   Savannah Wilson a 79 y.o. female who presents for one-week followup on treatment x2 for upper respiratory infection/sinusitis patient was initially treated on May 15 for sinusitis with Augmentin and Robitussin-AC x1 week followed by repeat evaluation resulting in another round of antibiotics, Omnicef x1 week along with a prednisone taper  for persistent symptoms. She returns today saying she is no better. She continues to have cough and congestion and feels lousy. She denies fevers, purulent sputum or nasal drainage, pleuritic chest pain and weight gain. She's accompanied by her daughter. Her daughter notes that for the past several months she has been noticing that her mother is dyspneic in walking from the car to her office.     Past Medical History  Diagnosis Date  . Hypothyroid   . Hyperlipidemia   . Diverticulosis   . Peripheral neuralgia   . Hyperkalemia   . Hypertension   . Asthma   . Depression   . GERD (gastroesophageal reflux disease)   . Venous insufficiency   .  Barrett esophagus   . Chronic bronchitis   . Diverticular disease   . Renal insufficiency   . Gout   . Pancreatitis due to biliary obstruction March 2010    s/p ERCP sphincterotomy, cholecystectomy    Past Surgical History  Procedure Laterality Date  . Ventral hernia repair    . Cataract extraction      right  . Cholecystectomy  02/10  . Vesicovaginal fistula closure w/ tah    . Tonsillectomy    . Ventral hernia repair  2007  . Cataract extraction  2008  . Cholecystectomy  2010  . Carpal tunnel release  jan 2013    [name]    . albuterol  2.5 mg Nebulization Once     The following portions of the patient's history were reviewed and updated as appropriate: Allergies, current medications, and problem list.    Review of Systems:   Patient denies headache, fevers,  unintentional weight loss, skin rash, eye pain, sinus pain, sore throat, dysphagia,  hemoptysis , heezing, chest pain, palpitations, orthopnea,abdominal pain, nausea, melena, diarrhea, constipation, flank pain, dysuria, hematuria, urinary  Frequency, nocturia, numbness, tingling, seizures,  Focal weakness, Loss of consciousness,  Tremor, insomnia, depression, anxiety, and suicidal ideation.     History   Social History  . Marital Status: Widowed    Spouse Name: N/A    Number of Children: 2  . Years of Education: N/A   Occupational History  . 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 Smoking status: Never Smoker   . Smokeless tobacco: Never Used  . Alcohol Use: Yes     Comment: rare  . Drug Use: No  . Sexually Active: Not on file   Other Topics Concern  . Not on file   Social History Narrative   ** Merged History Encounter **       Widowed. Has 2 children and lives alone.    Objective:  BP 142/68  Pulse 57  Temp(Src) 98.4 F (36.9 C) (Oral)  Resp 18  Wt 179 lb (81.194 kg)  BMI 34.96 kg/m2  SpO2 96%  General appearance: alert, cooperative and appears stated age. Not  dyspneic  Ears: normal TM's and external ear canals both ears Throat: lips, mucosa, and tongue normal; teeth and gums normal Neck: no adenopathy, no carotid bruit, supple, symmetrical, trachea midline and thyroid not enlarged, symmetric, no tenderness/mass/nodules Back: symmetric, no curvature. ROM normal. No CVA tenderness. Lungs: clear to auscultation bilaterally Heart: regular rate and rhythm, S1, S2 normal, no murmur, click, rub or gallop Abdomen: obese soft, non-tender; bowel sounds normal; no masses,  no organomegaly Pulses: 2+ and symmetric Skin: Skin color, texture, turgor normal. No rashes or lesions Lymph nodes: Cervical, supraclavicular, and axillary nodes normal. Ext:  Chronic nonpittting edema,  Wearing compression stockings  Assessment and Plan:  Allergic rhinitis She has no signs or symptoms of continued infection currently. Facial exam is normal. Ears look normal. I suggested that we treat the allergic rhinitis aggressively and and control her cough with Tussionex which she has used the past samples of daily nasal, and a steroid nasal spray. I've also recommended that she use Benadryl to help dry up her secretions and a second generation antihistamine if needed at night.  Dyspnea on exertion Per daughter she has become more dyspneic with exertion over the last several months. On exam today she has no signs of heart failure or asthma although she has a history of both. She is also obese. Discussed with patient and daughter that all these factors are contributing along with deconditioning. I recommended cardiopulmonary rehabilitation program at the hospital and patient is willing to attend. Referral in place. Given her concurrent history of anemia with hemoglobin of 10 in January we will recheck this prior to next visit.  A total of 25 minutes was spent with patient more than half of which was spent in counseling, reviewing records from other prviders and coordination of  care. Updated Medication List Outpatient Encounter Prescriptions as of 01/09/2013  Medication Sig Dispense Refill  . acetaminophen (TYLENOL) 650 MG CR tablet Take 650 mg by mouth every 8 (eight) hours as needed.      Marland Kitchen albuterol (PROVENTIL HFA;VENTOLIN HFA) 108 (90 BASE) MCG/ACT inhaler Inhale 2 puffs into the lungs every 6 (six) hours as needed for wheezing.  1 Inhaler  11  . ALPRAZolam (XANAX) 0.5 MG tablet Take 1 tablet (0.5 mg total) by mouth 2 (two) times daily as needed for anxiety.  30 tablet  3  . amLODipine (NORVASC) 10 MG tablet Take 1 tablet (10 mg total) by mouth daily.  30 tablet  3  . cefdinir (OMNICEF) 300 MG capsule Take 1 capsule (300 mg total) by mouth 2 (two) times daily.  20 capsule  0  . cyanocobalamin ([employer] VITAMIN B12) 2000 MCG tablet Take 1 tablet (2,000 mcg total) by mouth daily.  30 tablet  1  . DiphenhydrAMINE HCl (BENADRYL ALLERGY PO) Take by mouth as  needed.      . ergocalciferol (DRISDOL) 50000 UNITS capsule Take 1 capsule (50,000 Units total) by mouth once a week.  4 capsule  3  . furosemide (LASIX) 40 MG tablet One to two tablets daily as needed for fluid retention  80 tablet  2  . gabapentin (NEURONTIN) 100 MG capsule Take 1 capsule (100 mg total) by mouth 3 (three) times daily.  90 capsule  3  . guaiFENesin-codeine (ROBITUSSIN AC) 100-10 MG/5ML syrup Take 5 mLs by mouth 3 (three) times daily as needed for cough.  120 mL  0  . hydrALAZINE (APRESOLINE) 25 MG tablet Take 1 tablet (25 mg total) by mouth 3 (three) times daily.  30 tablet  3  . HYDROcodone-acetaminophen (NORCO/VICODIN) 5-325 MG per tablet Take 1 tablet by mouth every 6 (six) hours as needed for pain.  60 tablet  0  . Lactulose 20 GM/30ML SOLN Take 30 mLs (20 g total) by mouth daily. As needed for constipation , may repeat in 6 hours if needed  240 mL  0  . levothyroxine (SYNTHROID, LEVOTHROID) 88 MCG tablet Take 1 tablet (88 mcg total) by mouth daily.  30 tablet  3  . loperamide (IMODIUM A-D) 2 MG tablet  Take 2 mg by mouth 4 (four) times daily as needed.        Marland Kitchen losartan (COZAAR) 100 MG tablet Take 1 tablet (100 mg total) by mouth daily.  30 tablet  3  . metoprolol (LOPRESSOR) 50 MG tablet Take 1 tablet (50 mg total) by mouth 2 (two) times daily.  60 tablet  5  . mometasone-formoterol (DULERA) 100-5 MCG/ACT AERO Inhale 2 puffs into the lungs 2 (two) times daily.  13 g  11  . ondansetron (ZOFRAN) 4 MG tablet Take 4 mg by mouth every 4 (four) hours as needed. For nausea and vomiting      . pantoprazole (PROTONIX) 40 MG tablet Take 1 tablet (40 mg total) by mouth 2 (two) times daily before a meal.  60 tablet  1  . predniSONE (DELTASONE) 10 MG tablet Take 6 tablets x 1 day and then decrease by 1/2 tablet per day until down to zero mg  39 tablet  0  . sertraline (ZOLOFT) 50 MG tablet Take 1 tablet (50 mg total) by mouth daily.  90 tablet  3  . traMADol (ULTRAM) 50 MG tablet Take 1 tablet (50 mg total) by mouth every 6 (six) hours as needed.  120 tablet  2  . Beclomethasone Dipropionate 80 MCG/ACT AERS Place 2 sprays into the nose daily.  8.7 g  11  . chlorpheniramine-HYDROcodone (TUSSIONEX) 10-8 MG/5ML LQCR Take 5 mLs by mouth every 12 (twelve) hours as needed.  180 mL  1   Facility-Administered Encounter Medications as of 01/09/2013  Medication Dose Route Frequency Provider Last Rate Last Dose  . albuterol (PROVENTIL) (2.5 MG/3ML) 0.083% nebulizer solution 2.5 mg  2.5 mg Nebulization Once [name], MD

## 2013-01-09 NOTE — Patient Instructions (Addendum)
Your symptoms are coming from allergic rhinitis  Take allegra or zyrtec in the morning   Take benadryl 25 mg in the evening  Flush sinuses twice daily with sterile salt water (Simply saline brand is good)  Cough medicine with hydrocodone to stop the cough, every 12 hours  Use the new nasal spray after you flush,  Two squirts in each side once daily.  Continue for the whole summer  Referral to cardiopulmonary rehab program at the hospital under way to improve your waling distance

## 2013-01-10 ENCOUNTER — Telehealth: Payer: Self-pay | Admitting: *Deleted

## 2013-01-10 NOTE — Telephone Encounter (Signed)
She was given 2  Weeks of samples yesterday!  Why is she filling it already?

## 2013-01-10 NOTE — Telephone Encounter (Signed)
Pharmacy Note:  Qnasal 65mcg/inh nose    Not covered Can we use Flonase or nasacort?  Please advise  Phone number: [phone]  Fax Number: [phone]

## 2013-01-11 ENCOUNTER — Encounter: Payer: Self-pay | Admitting: Internal Medicine

## 2013-01-11 DIAGNOSIS — J309 Allergic rhinitis, unspecified: Secondary | ICD-10-CM | POA: Insufficient documentation

## 2013-01-11 NOTE — Assessment & Plan Note (Addendum)
Per daughter she has become more dyspneic with exertion over the last several months. On exam today she has no signs of heart failure or asthma although she has a history of both. She is also obese. Discussed with patient and daughter that all these factors are contributing along with deconditioning. I recommended cardiopulmonary rehabilitation program at the hospital and patient is willing to attend. Referral in place. Given her concurrent history of anemia with hemoglobin of 10 in January we will recheck this prior to next visit.

## 2013-01-11 NOTE — Assessment & Plan Note (Signed)
She has no signs or symptoms of continued infection currently. Facial exam is normal. Ears look normal. I suggested that we treat the allergic rhinitis aggressively and and control her cough with Tussionex which she has used the past samples of daily nasal, and a steroid nasal spray. I've also recommended that she use Benadryl to help dry up her secretions and a second generation antihistamine if needed at night.

## 2013-01-21 ENCOUNTER — Telehealth: Payer: Self-pay | Admitting: *Deleted

## 2013-01-21 NOTE — Telephone Encounter (Signed)
Pharmacy Note:  Qnasl 80 mcg/inh nose AER Gm #8.7  Instill 2 sprays into each nostril once daily   QNASL has a very high copay for the patient, can we use Flonase or something generic to save her money?

## 2013-01-22 MED ORDER — MOMETASONE FUROATE 50 MCG/ACT NA SUSP: NASAL | Status: DC

## 2013-01-22 NOTE — Telephone Encounter (Signed)
Please advise 

## 2013-01-22 NOTE — Telephone Encounter (Signed)
Yes, nasonex called in

## 2013-01-30 ENCOUNTER — Encounter: Payer: Self-pay | Admitting: Internal Medicine

## 2013-01-30 ENCOUNTER — Ambulatory Visit (INDEPENDENT_AMBULATORY_CARE_PROVIDER_SITE_OTHER): Payer: Medicare Other | Admitting: Internal Medicine

## 2013-01-30 VITALS — BP 140/68 | HR 59 | Temp 97.8°F | Resp 14 | Wt 176.2 lb

## 2013-01-30 DIAGNOSIS — E039 Hypothyroidism, unspecified: Secondary | ICD-10-CM

## 2013-01-30 DIAGNOSIS — D638 Anemia in other chronic diseases classified elsewhere: Secondary | ICD-10-CM

## 2013-01-30 DIAGNOSIS — N184 Chronic kidney disease, stage 4 (severe): Secondary | ICD-10-CM

## 2013-01-30 DIAGNOSIS — J309 Allergic rhinitis, unspecified: Secondary | ICD-10-CM

## 2013-01-30 DIAGNOSIS — Z79899 Other long term (current) drug therapy: Secondary | ICD-10-CM

## 2013-01-30 LAB — CBC WITH DIFFERENTIAL/PLATELET
Basophils Absolute: 0 10*3/uL (ref 0.0–0.1)
Hemoglobin: 9.9 g/dL — ABNORMAL LOW (ref 12.0–15.0)
Lymphocytes Relative: 15.1 % (ref 12.0–46.0)
Monocytes Relative: 6.4 % (ref 3.0–12.0)
Neutro Abs: 5.4 10*3/uL (ref 1.4–7.7)
Neutrophils Relative %: 75.8 % (ref 43.0–77.0)
Platelets: 351 10*3/uL (ref 150.0–400.0)
RDW: 16 % — ABNORMAL HIGH (ref 11.5–14.6)

## 2013-01-30 LAB — COMPREHENSIVE METABOLIC PANEL
ALT: 12 U/L (ref 0–35)
Albumin: 3.3 g/dL — ABNORMAL LOW (ref 3.5–5.2)
CO2: 22 mEq/L (ref 19–32)
Calcium: 9.2 mg/dL (ref 8.4–10.5)
Chloride: 103 mEq/L (ref 96–112)
GFR: 24.38 mL/min — ABNORMAL LOW (ref >60.00)
Potassium: 5.3 mEq/L — ABNORMAL HIGH (ref 3.5–5.1)
Sodium: 136 mEq/L (ref 135–145)
Total Protein: 6.4 g/dL (ref 6.0–8.3)

## 2013-01-30 MED ORDER — FLUTICASONE PROPIONATE 50 MCG/ACT NA SUSP: 2.0000 | Freq: Every day | NASAL | Status: AC

## 2013-01-30 NOTE — Progress Notes (Signed)
Patient ID: Savannah Wilson, female   DOB: 1929/01/26, 79 y.o.   MRN: [id number]   Patient Active Problem List   Diagnosis Date Noted  . Allergic rhinitis 01/11/2013  . Dyspnea on exertion 01/11/2013  . Bradycardia, drug induced 08/26/2012  . Renal cysts, acquired, bilateral 08/26/2012  . Pleural effusion, bilateral 08/26/2012  . Diverticulitis 08/21/2012  . B12 deficiency anemia 06/24/2012  . Neuropathy 05/22/2012  . Vestibular dizziness 05/22/2012  . History of pancreatitis   . Light headed 03/16/2012  . Lower extremity pain, anterior 12/15/2011  . Hypertension, renal disease 09/15/2011  . Restless legs syndrome 09/15/2011  . Systolic CHF, chronic 0000000  . Asthma   . Depression   . GERD (gastroesophageal reflux disease)   . Venous insufficiency   . Barrett esophagus   . CKD (chronic kidney disease), stage IV     Subjective:  CC:   Chief Complaint  Patient presents with  . Follow-up    nausea and emesis X 1 today.    HPI:   Savannah Wilson a 79 y.o. female who presents Follow up on sinus congestion and drainage.  Her allergies and sinus symptoms have improved.  She is requesting a less expensive steroid nasal spray.  She had an episode of emesis this morning after eating fruit. She denies any recent diarrhea or constipation. She has no UTI symptoms.   Reviewed diet;  Breakfast is usually an egg, toast and a bacon slice. Or toast and jelly and grits.,   Lunch is larger,  a salad or soup.  Dinner is light, a sandwhich  She eats dinner out mostly at lunch and at dinner.    Past Medical History  Diagnosis Date  . Hypothyroid   . Hyperlipidemia   . Diverticulosis   . Peripheral neuralgia   . Hyperkalemia   . Hypertension   . Asthma   . Depression   . GERD (gastroesophageal reflux disease)   . Venous insufficiency   . Barrett esophagus   . Chronic bronchitis   . Diverticular disease   . Renal insufficiency   . Gout   . Pancreatitis due to biliary obstruction  March 2010    s/p ERCP sphincterotomy, cholecystectomy    Past Surgical History  Procedure Laterality Date  . Ventral hernia repair    . Cataract extraction      right  . Cholecystectomy  02/10  . Vesicovaginal fistula closure w/ tah    . Tonsillectomy    . Ventral hernia repair  2007  . Cataract extraction  2008  . Cholecystectomy  2010  . Carpal tunnel release  jan 2013    [name]    . albuterol  2.5 mg Nebulization Once     The following portions of the patient's history were reviewed and updated as appropriate: Allergies, current medications, and problem list.    Review of Systems:   12 Pt  review of systems was negative except those addressed in the HPI,     History   Social History  . Marital Status: Widowed    Spouse Name: N/A    Number of Children: 2  . Years of Education: N/A   Occupational History  . 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 Smoking status: Never Smoker   . Smokeless tobacco: Never Used  . Alcohol Use: Yes     Comment: rare  . Drug Use: No  . Sexually Active: Not on file   Other Topics  Concern  . Not on file   Social History Narrative   ** Merged History Encounter **       Widowed. Has 2 children and lives alone.    Objective:  BP 140/68  Pulse 59  Temp(Src) 97.8 F (36.6 C) (Oral)  Resp 14  Wt 176 lb 4 oz (79.946 kg)  BMI 34.42 kg/m2  SpO2 96%  General appearance: alert, cooperative and appears stated age Ears: normal TM's and external ear canals both ears Throat: lips, mucosa, and tongue normal; teeth and gums normal Neck: no adenopathy, no carotid bruit, supple, symmetrical, trachea midline and thyroid not enlarged, symmetric, no tenderness/mass/nodules Back: symmetric, no curvature. ROM normal. No CVA tenderness. Lungs: clear to auscultation bilaterally Heart: regular rate and rhythm, S1, S2 normal, no murmur, click, rub or gallop Abdomen: soft, non-tender; bowel sounds normal; no  masses,  no organomegaly Pulses: 2+ and symmetric Skin: Skin color, texture, turgor normal. No rashes or lesions Lymph nodes: Cervical, supraclavicular, and axillary nodes normal.  Assessment and Plan:  Allergic rhinitis Improved with steroid nasal spray.  Asthma Symptoms controlled but contribute overall to her physical deconditioning. Referral to [facility] PT   Updated Medication List Outpatient Encounter Prescriptions as of 01/30/2013  Medication Sig Dispense Refill  . acetaminophen (TYLENOL) 650 MG CR tablet Take 650 mg by mouth every 8 (eight) hours as needed.      Marland Kitchen albuterol (PROVENTIL HFA;VENTOLIN HFA) 108 (90 BASE) MCG/ACT inhaler Inhale 2 puffs into the lungs every 6 (six) hours as needed for wheezing.  1 Inhaler  11  . ALPRAZolam (XANAX) 0.5 MG tablet Take 1 tablet (0.5 mg total) by mouth 2 (two) times daily as needed for anxiety.  30 tablet  3  . amLODipine (NORVASC) 10 MG tablet Take 1 tablet (10 mg total) by mouth daily.  30 tablet  3  . Beclomethasone Dipropionate 80 MCG/ACT AERS Place 2 sprays into the nose daily.  8.7 g  11  . cyanocobalamin ([employer] VITAMIN B12) 2000 MCG tablet Take 1 tablet (2,000 mcg total) by mouth daily.  30 tablet  1  . furosemide (LASIX) 40 MG tablet One to two tablets daily as needed for fluid retention  80 tablet  2  . gabapentin (NEURONTIN) 100 MG capsule Take 1 capsule (100 mg total) by mouth 3 (three) times daily.  90 capsule  3  . hydrALAZINE (APRESOLINE) 25 MG tablet Take 1 tablet (25 mg total) by mouth 3 (three) times daily.  30 tablet  3  . Lactulose 20 GM/30ML SOLN Take 30 mLs (20 g total) by mouth daily. As needed for constipation , may repeat in 6 hours if needed  240 mL  0  . levothyroxine (SYNTHROID, LEVOTHROID) 88 MCG tablet Take 1 tablet (88 mcg total) by mouth daily.  30 tablet  3  . losartan (COZAAR) 100 MG tablet Take 1 tablet (100 mg total) by mouth daily.  30 tablet  3  . metoprolol (LOPRESSOR) 50 MG tablet Take 1 tablet (50 mg total)  by mouth 2 (two) times daily.  60 tablet  5  . mometasone-formoterol (DULERA) 100-5 MCG/ACT AERO Inhale 2 puffs into the lungs 2 (two) times daily.  13 g  11  . ondansetron (ZOFRAN) 4 MG tablet Take 4 mg by mouth every 4 (four) hours as needed. For nausea and vomiting      . pantoprazole (PROTONIX) 40 MG tablet Take 1 tablet (40 mg total) by mouth 2 (two) times daily before a meal.  60 tablet  1  . sertraline (ZOLOFT) 50 MG tablet Take 1 tablet (50 mg total) by mouth daily.  90 tablet  3  . traMADol (ULTRAM) 50 MG tablet Take 1 tablet (50 mg total) by mouth every 6 (six) hours as needed.  120 tablet  2  . chlorpheniramine-HYDROcodone (TUSSIONEX) 10-8 MG/5ML LQCR Take 5 mLs by mouth every 12 (twelve) hours as needed.  180 mL  1  . DiphenhydrAMINE HCl (BENADRYL ALLERGY PO) Take by mouth as needed.      . ergocalciferol (DRISDOL) 50000 UNITS capsule Take 1 capsule (50,000 Units total) by mouth once a week.  4 capsule  3  . fluticasone (FLONASE) 50 MCG/ACT nasal spray Place 2 sprays into the nose daily.  16 g  6  . guaiFENesin-codeine (ROBITUSSIN AC) 100-10 MG/5ML syrup Take 5 mLs by mouth 3 (three) times daily as needed for cough.  120 mL  0  . HYDROcodone-acetaminophen (NORCO/VICODIN) 5-325 MG per tablet Take 1 tablet by mouth every 6 (six) hours as needed for pain.  60 tablet  0  . loperamide (IMODIUM A-D) 2 MG tablet Take 2 mg by mouth 4 (four) times daily as needed.        . predniSONE (DELTASONE) 10 MG tablet Take 6 tablets x 1 day and then decrease by 1/2 tablet per day until down to zero mg  39 tablet  0  . [DISCONTINUED] cefdinir (OMNICEF) 300 MG capsule Take 1 capsule (300 mg total) by mouth 2 (two) times daily.  20 capsule  0  . [DISCONTINUED] mometasone (NASONEX) 50 MCG/ACT nasal spray 2 sprays in each nostril daily  17 g  12   Facility-Administered Encounter Medications as of 01/30/2013  Medication Dose Route Frequency Provider Last Rate Last Dose  . albuterol (PROVENTIL) (2.5 MG/3ML)  0.083% nebulizer solution 2.5 mg  2.5 mg Nebulization Once [name], MD         No orders of the defined types were placed in this encounter.    No Follow-up on file.

## 2013-01-31 ENCOUNTER — Encounter: Payer: Self-pay | Admitting: Internal Medicine

## 2013-01-31 NOTE — Assessment & Plan Note (Signed)
Improved with steroid nasal spray.

## 2013-01-31 NOTE — Assessment & Plan Note (Signed)
Symptoms controlled but contribute overall to her physical deconditioning. Referral to [facility] PT

## 2013-02-01 NOTE — Addendum Note (Signed)
Addended by: [name] on: 02/01/2013 06:32 AM   Modules accepted: Orders

## 2013-02-11 ENCOUNTER — Other Ambulatory Visit (INDEPENDENT_AMBULATORY_CARE_PROVIDER_SITE_OTHER): Payer: Medicare Other

## 2013-02-11 DIAGNOSIS — N184 Chronic kidney disease, stage 4 (severe): Secondary | ICD-10-CM

## 2013-02-11 LAB — BASIC METABOLIC PANEL
BUN: 37 mg/dL — ABNORMAL HIGH (ref 6–23)
Creatinine, Ser: 2.2 mg/dL — ABNORMAL HIGH (ref 0.4–1.2)
GFR: 23.33 mL/min — ABNORMAL LOW (ref >60.00)
Glucose, Bld: 108 mg/dL — ABNORMAL HIGH (ref 70–99)

## 2013-02-21 ENCOUNTER — Other Ambulatory Visit: Payer: Self-pay | Admitting: *Deleted

## 2013-02-21 MED ORDER — AMLODIPINE BESYLATE 10 MG PO TABS: 10.0000 mg | ORAL_TABLET | Freq: Every day | ORAL | Status: DC

## 2013-02-25 ENCOUNTER — Emergency Department: Payer: Self-pay | Admitting: Emergency Medicine

## 2013-02-28 ENCOUNTER — Other Ambulatory Visit: Payer: Self-pay | Admitting: *Deleted

## 2013-02-28 MED ORDER — PANTOPRAZOLE SODIUM 40 MG PO TBEC: 40.0000 mg | DELAYED_RELEASE_TABLET | Freq: Two times a day (BID) | ORAL | Status: DC

## 2013-03-01 ENCOUNTER — Ambulatory Visit: Payer: Medicare Other | Admitting: Adult Health

## 2013-03-04 ENCOUNTER — Encounter: Payer: Self-pay | Admitting: Adult Health

## 2013-03-04 ENCOUNTER — Ambulatory Visit (INDEPENDENT_AMBULATORY_CARE_PROVIDER_SITE_OTHER): Payer: Self-pay | Admitting: Adult Health

## 2013-03-04 VITALS — BP 118/56 | HR 60 | Temp 97.9°F | Resp 12 | Wt 176.0 lb

## 2013-03-04 DIAGNOSIS — W19XXXA Unspecified fall, initial encounter: Secondary | ICD-10-CM | POA: Insufficient documentation

## 2013-03-04 DIAGNOSIS — W19XXXD Unspecified fall, subsequent encounter: Secondary | ICD-10-CM

## 2013-03-04 DIAGNOSIS — Z09 Encounter for follow-up examination after completed treatment for conditions other than malignant neoplasm: Secondary | ICD-10-CM

## 2013-03-04 NOTE — Assessment & Plan Note (Addendum)
Patient is seen in followup after fall last week. She was seen in the ED without evidence of fractures. The patient did have small laceration on her nose and she has multiple bruises. She is doing well. Continue to take Norco as needed for severe pain if not patient just takes Tylenol. She is also taking Xanax as needed to help her sleep. Bilateral lower extremities are edematous; however, she has not been able to apply the compression stockings secondary to pain. She has some bruising on her knees and shins. Patient to elevate legs as much as possible. Continue to apply ice to painful areas for about 15 minutes and repeat 3-4 times a day. RTC if no improvement within one to 2 weeks or sooner if symptoms worsen. Note, greater than 30 minutes were spent in face-to-face communication with patient in reviewing hospital records, assessment, evaluation, planning and implementation of care pertaining to this problem.

## 2013-03-04 NOTE — Progress Notes (Signed)
Subjective:    Patient ID: Savannah Wilson, female    DOB: 17-Sep-1928, 79 y.o.   MRN: [id number]  HPI  Patient is a pleasant 79 year old female who presents to clinic status post fall resulting in a visit to the emergency department. Patient reports that she was walking into a restaurant and did not notice the curb and tripped. Patient landed on her knees and also her face. She had a small laceration to the nose and multiple bruises. She denies any fractures. She reports being sore following this incident. She denies any vomiting, headaches, lethargy, difficulty breathing at this time.   Current Outpatient Prescriptions on File Prior to Visit  Medication Sig Dispense Refill  . acetaminophen (TYLENOL) 650 MG CR tablet Take 650 mg by mouth every 8 (eight) hours as needed.      Marland Kitchen albuterol (PROVENTIL HFA;VENTOLIN HFA) 108 (90 BASE) MCG/ACT inhaler Inhale 2 puffs into the lungs every 6 (six) hours as needed for wheezing.  1 Inhaler  11  . ALPRAZolam (XANAX) 0.5 MG tablet Take 1 tablet (0.5 mg total) by mouth 2 (two) times daily as needed for anxiety.  30 tablet  3  . amLODipine (NORVASC) 10 MG tablet Take 1 tablet (10 mg total) by mouth daily.  30 tablet  5  . Beclomethasone Dipropionate 80 MCG/ACT AERS Place 2 sprays into the nose daily.  8.7 g  11  . cyanocobalamin ([employer]S VITAMIN B12) 2000 MCG tablet Take 1 tablet (2,000 mcg total) by mouth daily.  30 tablet  1  . DiphenhydrAMINE HCl (BENADRYL ALLERGY PO) Take 1 tablet by mouth at bedtime. Take by mouth as needed.      . ergocalciferol (DRISDOL) 50000 UNITS capsule Take 1 capsule (50,000 Units total) by mouth once a week.  4 capsule  3  . fluticasone (FLONASE) 50 MCG/ACT nasal spray Place 2 sprays into the nose daily.  16 g  6  . furosemide (LASIX) 40 MG tablet One to two tablets daily as needed for fluid retention  80 tablet  2  . gabapentin (NEURONTIN) 100 MG capsule Take 1 capsule (100 mg total) by mouth 3 (three) times daily.  90 capsule  3  .  hydrALAZINE (APRESOLINE) 25 MG tablet Take 1 tablet (25 mg total) by mouth 3 (three) times daily.  30 tablet  3  . HYDROcodone-acetaminophen (NORCO/VICODIN) 5-325 MG per tablet Take 1 tablet by mouth every 6 (six) hours as needed for pain.  60 tablet  0  . Lactulose 20 GM/30ML SOLN Take 30 mLs (20 g total) by mouth daily. As needed for constipation , may repeat in 6 hours if needed  240 mL  0  . levothyroxine (SYNTHROID, LEVOTHROID) 88 MCG tablet Take 1 tablet (88 mcg total) by mouth daily.  30 tablet  3  . loperamide (IMODIUM A-D) 2 MG tablet Take 2 mg by mouth 4 (four) times daily as needed.        Marland Kitchen losartan (COZAAR) 100 MG tablet Take 1 tablet (100 mg total) by mouth daily.  30 tablet  3  . metoprolol (LOPRESSOR) 50 MG tablet Take 1 tablet (50 mg total) by mouth 2 (two) times daily.  60 tablet  5  . mometasone-formoterol (DULERA) 100-5 MCG/ACT AERO Inhale 2 puffs into the lungs 2 (two) times daily.  13 g  11  . ondansetron (ZOFRAN) 4 MG tablet Take 4 mg by mouth every 4 (four) hours as needed. For nausea and vomiting      . pantoprazole (  PROTONIX) 40 MG tablet Take 1 tablet (40 mg total) by mouth 2 (two) times daily before a meal.  60 tablet  5  . sertraline (ZOLOFT) 50 MG tablet Take 1 tablet (50 mg total) by mouth daily.  90 tablet  3  . traMADol (ULTRAM) 50 MG tablet Take 1 tablet (50 mg total) by mouth every 6 (six) hours as needed.  120 tablet  2   Current Facility-Administered Medications on File Prior to Visit  Medication Dose Route Frequency Provider Last Rate Last Dose  . albuterol (PROVENTIL) (2.5 MG/3ML) 0.083% nebulizer solution 2.5 mg  2.5 mg Nebulization Once [name], MD        Review of Systems  Constitutional: Negative.   Eyes:       Bruising underneath both eyes  Cardiovascular: Positive for leg swelling.  Gastrointestinal: Negative.   Genitourinary: Negative.   Musculoskeletal: Positive for joint swelling.       Aches and pain associated with recent fall.   Skin:       Bruising below eyes, bilateral LE anteriorly  Neurological: Negative for dizziness, light-headedness and headaches.  Psychiatric/Behavioral: Negative.   All other systems reviewed and are negative.    BP 118/56  Pulse 60  Temp(Src) 97.9 F (36.6 C) (Oral)  Resp 12  Wt 176 lb (79.833 kg)  BMI 34.37 kg/m2  SpO2 96%    Objective:   Physical Exam  Constitutional: She is oriented to person, place, and time.  Overweight, pleasant female in NAD  HENT:  Head: Normocephalic and atraumatic.  Cardiovascular: Normal rate, regular rhythm and normal heart sounds.  Exam reveals no gallop and no friction rub.   No murmur heard. Pulmonary/Chest: Effort normal and breath sounds normal. No respiratory distress. She has no wheezes. She has no rales. She exhibits no tenderness.  Abdominal: Soft. Bowel sounds are normal.  Musculoskeletal: She exhibits edema.  Edema 2+ bilateral LE. She has been unable to wear her compression hose since she fell 2/2 pain with applying stockings.  Neurological: She is alert and oriented to person, place, and time.  Skin: Skin is warm and dry.  Psychiatric: She has a normal mood and affect. Her behavior is normal. Judgment and thought content normal.          Assessment & Plan:

## 2013-03-18 ENCOUNTER — Encounter: Payer: Self-pay | Admitting: Adult Health

## 2013-04-02 ENCOUNTER — Encounter: Payer: Self-pay | Admitting: Emergency Medicine

## 2013-04-09 ENCOUNTER — Telehealth: Payer: Self-pay | Admitting: Internal Medicine

## 2013-04-09 NOTE — Telephone Encounter (Signed)
Still getting incompletel fax transmission from [place]  pls let them know . Only received page 1 of notef from Aug 28

## 2013-04-09 NOTE — Telephone Encounter (Signed)
mandy @ [facility] [place] will refax office notes

## 2013-04-12 ENCOUNTER — Telehealth: Payer: Self-pay | Admitting: Internal Medicine

## 2013-04-12 NOTE — Telephone Encounter (Signed)
Wanted appt for possible UTI.  No appt available.  Refused transfer to triage.  Pt states she will go to walk-in if it gets worse.

## 2013-04-12 NOTE — Telephone Encounter (Signed)
Left message on machine returning patient's call 

## 2013-04-16 ENCOUNTER — Ambulatory Visit (INDEPENDENT_AMBULATORY_CARE_PROVIDER_SITE_OTHER): Payer: BC Managed Care – PPO | Admitting: Adult Health

## 2013-04-16 ENCOUNTER — Other Ambulatory Visit: Payer: Self-pay | Admitting: Adult Health

## 2013-04-16 ENCOUNTER — Encounter: Payer: Self-pay | Admitting: Adult Health

## 2013-04-16 VITALS — BP 128/58 | HR 63 | Temp 97.6°F | Resp 16 | Ht 60.0 in | Wt 177.0 lb

## 2013-04-16 DIAGNOSIS — R300 Dysuria: Secondary | ICD-10-CM

## 2013-04-16 LAB — POCT URINALYSIS DIPSTICK
Glucose, UA: NEGATIVE
Ketones, UA: NEGATIVE
Protein, UA: NEGATIVE
Spec Grav, UA: <=1.005
Urobilinogen, UA: 0.2

## 2013-04-16 MED ORDER — AMOXICILLIN-POT CLAVULANATE 500-125 MG PO TABS: 1.0000 | ORAL_TABLET | Freq: Three times a day (TID) | ORAL | Status: DC

## 2013-04-16 NOTE — Patient Instructions (Addendum)
Urinary Tract Infection  Urinary tract infections (UTIs) can develop anywhere along your urinary tract. Your urinary tract is your body's drainage system for removing wastes and extra water. Your urinary tract includes two kidneys, two ureters, a bladder, and a urethra. Your kidneys are a pair of bean-shaped organs. Each kidney is about the size of your fist. They are located below your ribs, one on each side of your spine.  CAUSES  Infections are caused by microbes, which are microscopic organisms, including fungi, viruses, and bacteria. These organisms are so small that they can only be seen through a microscope. Bacteria are the microbes that most commonly cause UTIs.  SYMPTOMS   Symptoms of UTIs may vary by age and gender of the patient and by the location of the infection. Symptoms in young women typically include a frequent and intense urge to urinate and a painful, burning feeling in the bladder or urethra during urination. Older women and men are more likely to be tired, shaky, and weak and have muscle aches and abdominal pain. A fever may mean the infection is in your kidneys. Other symptoms of a kidney infection include pain in your back or sides below the ribs, nausea, and vomiting.  DIAGNOSIS  To diagnose a UTI, your caregiver will ask you about your symptoms. Your caregiver also will ask to provide a urine sample. The urine sample will be tested for bacteria and white blood cells. White blood cells are made by your body to help fight infection.  TREATMENT   Typically, UTIs can be treated with medication. Because most UTIs are caused by a bacterial infection, they usually can be treated with the use of antibiotics. The choice of antibiotic and length of treatment depend on your symptoms and the type of bacteria causing your infection.  HOME CARE INSTRUCTIONS   If you were prescribed antibiotics, take them exactly as your caregiver instructs you. Finish the medication even if you feel better after you  have only taken some of the medication.   Drink enough water and fluids to keep your urine clear or pale yellow.   Avoid caffeine, tea, and carbonated beverages. They tend to irritate your bladder.   Empty your bladder often. Avoid holding urine for long periods of time.   Empty your bladder before and after sexual intercourse.   After a bowel movement, women should cleanse from front to back. Use each tissue only once.  SEEK MEDICAL CARE IF:    You have back pain.   You develop a fever.   Your symptoms do not begin to resolve within 3 days.  SEEK IMMEDIATE MEDICAL CARE IF:    You have severe back pain or lower abdominal pain.   You develop chills.   You have nausea or vomiting.   You have continued burning or discomfort with urination.  MAKE SURE YOU:    Understand these instructions.   Will watch your condition.   Will get help right away if you are not doing well or get worse.  Document Released: 05/04/2005 Document Revised: 01/24/2012 Document Reviewed: 09/02/2011  [employer] Patient Information 2014 [employer].

## 2013-04-16 NOTE — Progress Notes (Signed)
Subjective:    Patient ID: Tykeia Rodenbeck, female    DOB: 05/16/1929, 79 y.o.   MRN: [id number]  HPI  Patient is a pleasant 79 year old female who presents to clinic with dysuria, urgency, frequency that has been ongoing for 5 days. Denies fever, hematuria or flank pain. She reports symptoms exactly the same as when she had a urinary tract infection.   Current Outpatient Prescriptions on File Prior to Visit  Medication Sig Dispense Refill  . acetaminophen (TYLENOL) 650 MG CR tablet Take 650 mg by mouth every 8 (eight) hours as needed.      Marland Kitchen albuterol (PROVENTIL HFA;VENTOLIN HFA) 108 (90 BASE) MCG/ACT inhaler Inhale 2 puffs into the lungs every 6 (six) hours as needed for wheezing.  1 Inhaler  11  . ALPRAZolam (XANAX) 0.5 MG tablet Take 1 tablet (0.5 mg total) by mouth 2 (two) times daily as needed for anxiety.  30 tablet  3  . Beclomethasone Dipropionate 80 MCG/ACT AERS Place 2 sprays into the nose daily.  8.7 g  11  . cyanocobalamin ([employer]S VITAMIN B12) 2000 MCG tablet Take 1 tablet (2,000 mcg total) by mouth daily.  30 tablet  1  . DiphenhydrAMINE HCl (BENADRYL ALLERGY PO) Take 1 tablet by mouth at bedtime. Take by mouth as needed.      . fexofenadine (ALLEGRA) 180 MG tablet Take 180 mg by mouth every morning.      . fluticasone (FLONASE) 50 MCG/ACT nasal spray Place 2 sprays into the nose daily.  16 g  6  . furosemide (LASIX) 40 MG tablet One to two tablets daily as needed for fluid retention  80 tablet  2  . gabapentin (NEURONTIN) 100 MG capsule Take 1 capsule (100 mg total) by mouth 3 (three) times daily.  90 capsule  3  . hydrALAZINE (APRESOLINE) 25 MG tablet Take 1 tablet (25 mg total) by mouth 3 (three) times daily.  30 tablet  3  . HYDROcodone-acetaminophen (NORCO/VICODIN) 5-325 MG per tablet Take 1 tablet by mouth every 6 (six) hours as needed for pain.  60 tablet  0  . Lactulose 20 GM/30ML SOLN Take 30 mLs (20 g total) by mouth daily. As needed for constipation , may repeat in 6 hours if  needed  240 mL  0  . levothyroxine (SYNTHROID, LEVOTHROID) 88 MCG tablet Take 1 tablet (88 mcg total) by mouth daily.  30 tablet  3  . loperamide (IMODIUM A-D) 2 MG tablet Take 2 mg by mouth 4 (four) times daily as needed.        Marland Kitchen losartan (COZAAR) 100 MG tablet Take 1 tablet (100 mg total) by mouth daily.  30 tablet  3  . metoprolol (LOPRESSOR) 50 MG tablet Take 1 tablet (50 mg total) by mouth 2 (two) times daily.  60 tablet  5  . mometasone-formoterol (DULERA) 100-5 MCG/ACT AERO Inhale 2 puffs into the lungs 2 (two) times daily.  13 g  11  . ondansetron (ZOFRAN) 4 MG tablet Take 4 mg by mouth every 4 (four) hours as needed. For nausea and vomiting      . pantoprazole (PROTONIX) 40 MG tablet Take 1 tablet (40 mg total) by mouth 2 (two) times daily before a meal.  60 tablet  5  . sertraline (ZOLOFT) 50 MG tablet Take 1 tablet (50 mg total) by mouth daily.  90 tablet  3  . traMADol (ULTRAM) 50 MG tablet Take 1 tablet (50 mg total) by mouth every 6 (six) hours as  needed.  120 tablet  2   Current Facility-Administered Medications on File Prior to Visit  Medication Dose Route Frequency Provider Last Rate Last Dose  . albuterol (PROVENTIL) (2.5 MG/3ML) 0.083% nebulizer solution 2.5 mg  2.5 mg Nebulization Once [name], MD         Review of Systems  Constitutional: Negative for fever and chills.  Gastrointestinal: Negative for abdominal pain.  Genitourinary: Positive for dysuria, urgency and frequency. Negative for hematuria, flank pain and pelvic pain.    BP 128/58  Pulse 63  Temp(Src) 97.6 F (36.4 C) (Oral)  Resp 16  Ht 5' (1.524 m)  Wt 177 lb (80.287 kg)  BMI 34.57 kg/m2  SpO2 97%    Objective:   Physical Exam  Constitutional: She is oriented to person, place, and time.  Cardiovascular: Normal rate and regular rhythm.   Pulmonary/Chest: Effort normal. No respiratory distress.  Abdominal: Soft.  Genitourinary:  No suprapubic discomfort  Neurological: She is alert and  oriented to person, place, and time.  Psychiatric: She has a normal mood and affect. Her behavior is normal. Thought content normal.      Assessment & Plan:

## 2013-04-16 NOTE — Addendum Note (Signed)
Addended by: Karlene Einstein D on: 04/16/2013 03:52 PM   Modules accepted: Orders

## 2013-04-16 NOTE — Assessment & Plan Note (Signed)
Suspect urinary tract infection. Start Augmentin 500 mg twice a day x5 days. Note, patient was dosed for creatinine clearance of 26.18.

## 2013-04-17 NOTE — Telephone Encounter (Signed)
Pt seen by Raquel 04/16/13

## 2013-04-18 ENCOUNTER — Telehealth: Payer: Self-pay | Admitting: Internal Medicine

## 2013-04-18 LAB — URINE CULTURE: Colony Count: 70000

## 2013-04-18 MED ORDER — HYDROCODONE-ACETAMINOPHEN 5-325 MG PO TABS: 1.0000 | ORAL_TABLET | Freq: Four times a day (QID) | ORAL | Status: DC | PRN

## 2013-04-18 NOTE — Telephone Encounter (Signed)
The patient is calling for the results of her urine test.

## 2013-04-18 NOTE — Telephone Encounter (Signed)
Final report not back yet

## 2013-04-18 NOTE — Telephone Encounter (Signed)
Called patient she had left voicemail stating she has started ABX prescribed due to  hurting and stinging when she urinates. FYI

## 2013-04-18 NOTE — Telephone Encounter (Signed)
Ok to refill norco  #[phone] refills.  Phone in please

## 2013-04-18 NOTE — Telephone Encounter (Signed)
Patient called back and stated she was prescribed Norco 5/325 for sciatic nerve pain down left leg patient stated she is almost out of medication and needs something for pain. Prescribed originally 1/14 please advise to fill.

## 2013-04-19 NOTE — Telephone Encounter (Signed)
Script called to pharmacy as requested.Left detailed message  Medication called to pharmacy.

## 2013-04-22 ENCOUNTER — Other Ambulatory Visit: Payer: Self-pay | Admitting: *Deleted

## 2013-04-22 MED ORDER — LEVOTHYROXINE SODIUM 88 MCG PO TABS: 88.0000 ug | ORAL_TABLET | Freq: Every day | ORAL | Status: DC

## 2013-04-22 MED ORDER — METOPROLOL TARTRATE 50 MG PO TABS: 50.0000 mg | ORAL_TABLET | Freq: Two times a day (BID) | ORAL | Status: DC

## 2013-05-03 ENCOUNTER — Ambulatory Visit (INDEPENDENT_AMBULATORY_CARE_PROVIDER_SITE_OTHER): Payer: BC Managed Care – PPO | Admitting: Internal Medicine

## 2013-05-03 ENCOUNTER — Encounter: Payer: Self-pay | Admitting: Internal Medicine

## 2013-05-03 VITALS — BP 162/64 | HR 63 | Temp 98.3°F | Resp 16 | Wt 175.5 lb

## 2013-05-03 DIAGNOSIS — F411 Generalized anxiety disorder: Secondary | ICD-10-CM

## 2013-05-03 DIAGNOSIS — I872 Venous insufficiency (chronic) (peripheral): Secondary | ICD-10-CM

## 2013-05-03 DIAGNOSIS — N189 Chronic kidney disease, unspecified: Secondary | ICD-10-CM

## 2013-05-03 DIAGNOSIS — I129 Hypertensive chronic kidney disease with stage 1 through stage 4 chronic kidney disease, or unspecified chronic kidney disease: Secondary | ICD-10-CM

## 2013-05-03 DIAGNOSIS — M25569 Pain in unspecified knee: Secondary | ICD-10-CM

## 2013-05-03 DIAGNOSIS — M25562 Pain in left knee: Secondary | ICD-10-CM

## 2013-05-03 MED ORDER — HYDROCODONE-ACETAMINOPHEN 5-325 MG PO TABS: 1.0000 | ORAL_TABLET | Freq: Four times a day (QID) | ORAL | Status: DC | PRN

## 2013-05-03 MED ORDER — ALPRAZOLAM 0.5 MG PO TABS: 0.5000 mg | ORAL_TABLET | Freq: Two times a day (BID) | ORAL | Status: DC | PRN

## 2013-05-03 NOTE — Assessment & Plan Note (Addendum)
She has significant asymmetric edema Complicated by rupture Bakers cyst in the left leg and ongoing use of vasodilator for control of blood pressure.  I am Stopping amlodipine today.  AVVS to wrap leg in Unnas boots.  Increase furosemide to 40 mg bid for the next 5 days. Return to [place] on Monday or Tuesday.

## 2013-05-03 NOTE — Progress Notes (Signed)
Patient ID: Savannah Wilson, female   DOB: 28-Jan-1929, 79 y.o.   MRN: [id number]   Patient Active Problem List   Diagnosis Date Noted  . Dysuria 04/16/2013  . Fall 03/04/2013  . Allergic rhinitis 01/11/2013  . Dyspnea on exertion 01/11/2013  . Bradycardia, drug induced 08/26/2012  . Renal cysts, acquired, bilateral 08/26/2012  . Pleural effusion, bilateral 08/26/2012  . Diverticulitis 08/21/2012  . B12 deficiency anemia 06/24/2012  . Neuropathy 05/22/2012  . Vestibular dizziness 05/22/2012  . History of pancreatitis   . Light headed 03/16/2012  . Lower extremity pain, anterior 12/15/2011  . Hypertension, renal disease 09/15/2011  . Restless legs syndrome 09/15/2011  . Systolic CHF, chronic 0000000  . Asthma   . Depression   . GERD (gastroesophageal reflux disease)   . Venous insufficiency   . Barrett esophagus   . CKD (chronic kidney disease), stage IV     Subjective:  CC:   Chief Complaint  Patient presents with  . Follow-up    3 month, patient has bilateral edema to lower extremeties.    HPI:   Savannah Wilson a 79 y.o. female who presents with persistent severe leg pain and swelling.  1) Edema:  Nephrology reduced  Her amlodipine in August to 5 MG but there was no improvement in her edema despite increasing her Lasix for several days..   She was sent to Dr Lucky Cowboy for ultrasound.  There was no DVT but ultrasound suggested a Ruptured enflamed Baker's cyst and she was sent to Dr Rochel Brome.  Today she had  a shot of cortisone by Dr Tamala Julian this morning.  She is in a lot of pain ,  The leg feels like it is on fire due to the increased edema. She has been trying to wear compression stockings daiily but having a difficult time due to pain and swelling. Has been using the vicodin 4 times daily be of leg pain .  Having constipation as a result  2) Recent fall. She saw Raquel in July after a fall with contusions and  minor facial lacerations. Fall occurred as she was walking in to  the  [employer] for lunch; tripped over the threshhold.  Went to the ER ,  nothing broken.  She denies being dizzy at the time and has had no subsequent falls. She does not use a walker. She states that she simply tripped over the threshold.   3) Hypertension:  bp up today but has been [id number] systolic at home.    4) CKD:  Cr stable per nephrology   Past Medical History  Diagnosis Date  . Hypothyroid   . Hyperlipidemia   . Diverticulosis   . Peripheral neuralgia   . Hyperkalemia   . Hypertension   . Asthma   . Depression   . GERD (gastroesophageal reflux disease)   . Venous insufficiency   . Barrett esophagus   . Chronic bronchitis   . Diverticular disease   . Renal insufficiency   . Gout   . Pancreatitis due to biliary obstruction March 2010    s/p ERCP sphincterotomy, cholecystectomy    Past Surgical History  Procedure Laterality Date  . Ventral hernia repair    . Cataract extraction      right  . Cholecystectomy  02/10  . Vesicovaginal fistula closure w/ tah    . Tonsillectomy    . Ventral hernia repair  2007  . Cataract extraction  2008  . Cholecystectomy  2010  . Carpal tunnel  release  jan 2013    [name]    . albuterol  2.5 mg Nebulization Once     The following portions of the patient's history were reviewed and updated as appropriate: Allergies, current medications, and problem list.    Review of Systems:   12 Pt  review of systems was negative except those addressed in the HPI,     History   Social History  . Marital Status: Widowed    Spouse Name: N/A    Number of Children: 2  . Years of Education: N/A   Occupational History  . 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 Smoking status: Never Smoker   . Smokeless tobacco: Never Used  . Alcohol Use: Yes     Comment: rare  . Drug Use: No  . Sexual Activity: Not on file   Other Topics Concern  . Not on file   Social History Narrative   ** Merged History Encounter **        Widowed. Has 2 children and lives alone.    Objective:  Filed Vitals:   05/03/13 1334  BP: 162/64  Pulse: 63  Temp: 98.3 F (36.8 C)  Resp: 16     General appearance: alert, cooperative and appears stated age Ears: normal TM's and external ear canals both ears Throat: lips, mucosa, and tongue normal; teeth and gums normal Neck: no adenopathy, no carotid bruit, supple, symmetrical, trachea midline and thyroid not enlarged, symmetric, no tenderness/mass/nodules Back: symmetric, no curvature. ROM normal. No CVA tenderness. Lungs: clear to auscultation bilaterally Heart: regular rate and rhythm, S1, S2 normal, no murmur, click, rub or gallop Abdomen: soft, non-tender; bowel sounds normal; no masses,  no organomegaly Pulses: 2+ and symmetric Skin: Skin color, texture, turgor normal. No rashes or lesions Lymph nodes: Cervical, supraclavicular, and axillary nodes normal.  Assessment and Plan:  Venous insufficiency She has significant asymmetric edema Complicated by rupture Bakers cyst in the left leg and ongoing use of vasodilator for control of blood pressure.  I am Stopping amlodipine today.  [facility] to wrap leg in [place] boots.  Increase furosemide to 40 mg bid for the next 5 days. Return to [place] on Monday or Tuesday.   Hypertension, renal disease Previously well managed with amlodipine but this is aggravating her venous insufficiency. I discussed the situation with Dr. Rolly Salter since Dr. Zollie Scale was not in the office. He agrees with stopping amlodipine and increasing her Lasix to 40 mg twice a day. Continue losartan, hydralazine, and  metoprolol.  A total of 40 minutes was spent with patient more than half of which was spent in counseling, reviewing records from other providers and coordination of care.  Updated Medication List Outpatient Encounter Prescriptions as of 05/03/2013  Medication Sig Dispense Refill  . acetaminophen (TYLENOL) 650 MG CR tablet Take 650 mg by mouth every 8  (eight) hours as needed.      Marland Kitchen albuterol (PROVENTIL HFA;VENTOLIN HFA) 108 (90 BASE) MCG/ACT inhaler Inhale 2 puffs into the lungs every 6 (six) hours as needed for wheezing.  1 Inhaler  11  . ALPRAZolam (XANAX) 0.5 MG tablet Take 1 tablet (0.5 mg total) by mouth 2 (two) times daily as needed for anxiety.  30 tablet  3  . ALPRAZolam (XANAX) 0.5 MG tablet Take 0.5 mg by mouth 2 (two) times daily as needed for anxiety.      Marland Kitchen amLODipine (NORVASC) 10 MG tablet Take 5 mg by mouth  daily.      . Beclomethasone Dipropionate 80 MCG/ACT AERS Place 2 sprays into the nose daily.  8.7 g  11  . cyanocobalamin ([facility] VITAMIN B12) 2000 MCG tablet Take 1 tablet (2,000 mcg total) by mouth daily.  30 tablet  1  . DiphenhydrAMINE HCl (BENADRYL ALLERGY PO) Take 1 tablet by mouth at bedtime. Take by mouth as needed.      . fexofenadine (ALLEGRA) 180 MG tablet Take 180 mg by mouth every morning.      . fluticasone (FLONASE) 50 MCG/ACT nasal spray Place 2 sprays into the nose daily.  16 g  6  . furosemide (LASIX) 40 MG tablet One to two tablets daily as needed for fluid retention  80 tablet  2  . gabapentin (NEURONTIN) 100 MG capsule Take 1 capsule (100 mg total) by mouth 3 (three) times daily.  90 capsule  3  . hydrALAZINE (APRESOLINE) 25 MG tablet Take 1 tablet (25 mg total) by mouth 3 (three) times daily.  30 tablet  3  . HYDROcodone-acetaminophen (NORCO/VICODIN) 5-325 MG per tablet Take 1 tablet by mouth every 6 (six) hours as needed for pain.  120 tablet  4  . Lactulose 20 GM/30ML SOLN Take 30 mLs (20 g total) by mouth daily. As needed for constipation , may repeat in 6 hours if needed  240 mL  0  . levothyroxine (SYNTHROID, LEVOTHROID) 88 MCG tablet Take 1 tablet (88 mcg total) by mouth daily.  30 tablet  8  . loperamide (IMODIUM A-D) 2 MG tablet Take 2 mg by mouth 4 (four) times daily as needed.        . metoprolol (LOPRESSOR) 50 MG tablet Take 1 tablet (50 mg total) by mouth 2 (two) times daily.  60 tablet  5  .  mometasone-formoterol (DULERA) 100-5 MCG/ACT AERO Inhale 2 puffs into the lungs 2 (two) times daily.  13 g  11  . ondansetron (ZOFRAN) 4 MG tablet Take 4 mg by mouth every 4 (four) hours as needed. For nausea and vomiting      . pantoprazole (PROTONIX) 40 MG tablet Take 1 tablet (40 mg total) by mouth 2 (two) times daily before a meal.  60 tablet  5  . sertraline (ZOLOFT) 50 MG tablet Take 1 tablet (50 mg total) by mouth daily.  90 tablet  3  . traMADol (ULTRAM) 50 MG tablet Take 1 tablet (50 mg total) by mouth every 6 (six) hours as needed.  120 tablet  2  . [DISCONTINUED] ALPRAZolam (XANAX) 0.5 MG tablet Take 1 tablet (0.5 mg total) by mouth 2 (two) times daily as needed for anxiety.  30 tablet  3  . [DISCONTINUED] HYDROcodone-acetaminophen (NORCO/VICODIN) 5-325 MG per tablet Take 1 tablet by mouth every 6 (six) hours as needed for pain.  60 tablet  4  . amoxicillin-clavulanate (AUGMENTIN) 500-125 MG per tablet Take 1 tablet (500 mg total) by mouth 3 (three) times daily.  10 tablet  0  . losartan (COZAAR) 100 MG tablet Take 1 tablet (100 mg total) by mouth daily.  30 tablet  3   Facility-Administered Encounter Medications as of 05/03/2013  Medication Dose Route Frequency Provider Last Rate Last Dose  . albuterol (PROVENTIL) (2.5 MG/3ML) 0.083% nebulizer solution 2.5 mg  2.5 mg Nebulization Once [name], MD

## 2013-05-03 NOTE — Patient Instructions (Addendum)
Stop the amlodipine completely.  Increase the furosemide to 40 mg twice daily for the next 5 days.    Elevate your legs as much as possible and wear your compression stockings as much as your can while up and around  Put them on in the morning before you have been standing on them  Please go to [facility] this afternoon (NOW) and they will wrap your legs in UNNA boots to help move some of this fluid

## 2013-05-05 NOTE — Assessment & Plan Note (Signed)
Previously well managed with amlodipine but this is aggravating her venous insufficiency. I stopping it completely today and increasing her Lasix to 40 mg twice a day. Continue losartan, hydralazine, and  metoprolol.

## 2013-05-07 ENCOUNTER — Encounter: Payer: Self-pay | Admitting: Adult Health

## 2013-05-07 ENCOUNTER — Ambulatory Visit (INDEPENDENT_AMBULATORY_CARE_PROVIDER_SITE_OTHER): Payer: BC Managed Care – PPO | Admitting: Adult Health

## 2013-05-07 VITALS — BP 152/58 | HR 59 | Temp 98.2°F | Resp 12 | Wt 168.5 lb

## 2013-05-07 DIAGNOSIS — Z23 Encounter for immunization: Secondary | ICD-10-CM | POA: Insufficient documentation

## 2013-05-07 DIAGNOSIS — I872 Venous insufficiency (chronic) (peripheral): Secondary | ICD-10-CM

## 2013-05-07 NOTE — Assessment & Plan Note (Signed)
Patient had LLE wrapped in [place] boot at Dr. Bunnie Domino office on Friday. Doing much better. Continues with lasix 40 mg bid for 1 more day then she goes back to daily. Remove unna boot today. Wear compression stockings.

## 2013-05-07 NOTE — Assessment & Plan Note (Signed)
Give flu vaccine today

## 2013-05-07 NOTE — Progress Notes (Signed)
Subjective:    Patient ID: Savannah Wilson, female    DOB: January 11, 1929, 79 y.o.   MRN: [id number]  HPI  Patient is a pleasant 79 y/o female who presents to clinic for f/u left lower extremity edema 2/2 venous insufficiency. Patient was seen at [facility] on Friday and had LLE wrapped in unna boot. Her lasix was also increased to 40 mg bid x 5 days then she was to return to daily dose. She is doing very well. No c/o this visit. She is anxious about getting the unna boot off. She is also requesting her flu shot.  Current Outpatient Prescriptions on File Prior to Visit  Medication Sig Dispense Refill  . acetaminophen (TYLENOL) 650 MG CR tablet Take 650 mg by mouth every 8 (eight) hours as needed.      Marland Kitchen albuterol (PROVENTIL HFA;VENTOLIN HFA) 108 (90 BASE) MCG/ACT inhaler Inhale 2 puffs into the lungs every 6 (six) hours as needed for wheezing.  1 Inhaler  11  . ALPRAZolam (XANAX) 0.5 MG tablet Take 1 tablet (0.5 mg total) by mouth 2 (two) times daily as needed for anxiety.  30 tablet  3  . ALPRAZolam (XANAX) 0.5 MG tablet Take 0.5 mg by mouth 2 (two) times daily as needed for anxiety.      . Beclomethasone Dipropionate 80 MCG/ACT AERS Place 2 sprays into the nose daily.  8.7 g  11  . cyanocobalamin ([facility]S VITAMIN B12) 2000 MCG tablet Take 1 tablet (2,000 mcg total) by mouth daily.  30 tablet  1  . DiphenhydrAMINE HCl (BENADRYL ALLERGY PO) Take 1 tablet by mouth at bedtime. Take by mouth as needed.      . fexofenadine (ALLEGRA) 180 MG tablet Take 180 mg by mouth every morning.      . fluticasone (FLONASE) 50 MCG/ACT nasal spray Place 2 sprays into the nose daily.  16 g  6  . gabapentin (NEURONTIN) 100 MG capsule Take 1 capsule (100 mg total) by mouth 3 (three) times daily.  90 capsule  3  . hydrALAZINE (APRESOLINE) 25 MG tablet Take 1 tablet (25 mg total) by mouth 3 (three) times daily.  30 tablet  3  . HYDROcodone-acetaminophen (NORCO/VICODIN) 5-325 MG per tablet Take 1 tablet by mouth every 6 (six) hours as  needed for pain.  120 tablet  4  . Lactulose 20 GM/30ML SOLN Take 30 mLs (20 g total) by mouth daily. As needed for constipation , may repeat in 6 hours if needed  240 mL  0  . levothyroxine (SYNTHROID, LEVOTHROID) 88 MCG tablet Take 1 tablet (88 mcg total) by mouth daily.  30 tablet  8  . loperamide (IMODIUM A-D) 2 MG tablet Take 2 mg by mouth 4 (four) times daily as needed.        Marland Kitchen losartan (COZAAR) 100 MG tablet Take 1 tablet (100 mg total) by mouth daily.  30 tablet  3  . metoprolol (LOPRESSOR) 50 MG tablet Take 1 tablet (50 mg total) by mouth 2 (two) times daily.  60 tablet  5  . mometasone-formoterol (DULERA) 100-5 MCG/ACT AERO Inhale 2 puffs into the lungs 2 (two) times daily.  13 g  11  . ondansetron (ZOFRAN) 4 MG tablet Take 4 mg by mouth every 4 (four) hours as needed. For nausea and vomiting      . pantoprazole (PROTONIX) 40 MG tablet Take 1 tablet (40 mg total) by mouth 2 (two) times daily before a meal.  60 tablet  5  .  sertraline (ZOLOFT) 50 MG tablet Take 1 tablet (50 mg total) by mouth daily.  90 tablet  3  . traMADol (ULTRAM) 50 MG tablet Take 1 tablet (50 mg total) by mouth every 6 (six) hours as needed.  120 tablet  2   Current Facility-Administered Medications on File Prior to Visit  Medication Dose Route Frequency Provider Last Rate Last Dose  . albuterol (PROVENTIL) (2.5 MG/3ML) 0.083% nebulizer solution 2.5 mg  2.5 mg Nebulization Once [name], MD         Review of Systems  Constitutional: Negative.   Respiratory: Negative.   Cardiovascular: Positive for leg swelling.       Unna boot to LLE. Improved edema       Objective:   Physical Exam  Constitutional: She is oriented to person, place, and time. She appears well-developed and well-nourished. No distress.  Cardiovascular: Normal rate, regular rhythm and normal heart sounds.  Exam reveals no gallop and no friction rub.   No murmur heard. Pulmonary/Chest: Effort normal and breath sounds normal. No  respiratory distress. She has no wheezes. She has no rales.  Musculoskeletal: She exhibits edema.  Compression stocking to RLE with trace to 1+ edema. LLE with unna boot.  Neurological: She is alert and oriented to person, place, and time.  Psychiatric: She has a normal mood and affect. Her behavior is normal. Judgment and thought content normal.    BP 152/58  Pulse 59  Temp(Src) 98.2 F (36.8 C) (Oral)  Resp 12  Wt 168 lb 8 oz (76.431 kg)  BMI 32.91 kg/m2  SpO2 97%       Assessment & Plan:

## 2013-05-27 ENCOUNTER — Other Ambulatory Visit: Payer: Self-pay | Admitting: *Deleted

## 2013-05-27 NOTE — Telephone Encounter (Signed)
Refill Request  Pramipexole Dihydroc 01.25 mg tab  #30   One tablet by mouth at bedtime

## 2013-05-28 MED ORDER — PRAMIPEXOLE DIHYDROCHLORIDE 0.125 MG PO TABS: 0.1250 mg | ORAL_TABLET | Freq: Three times a day (TID) | ORAL | Status: DC

## 2013-05-28 MED ORDER — TRAMADOL HCL 50 MG PO TABS: 50.0000 mg | ORAL_TABLET | Freq: Four times a day (QID) | ORAL | Status: DC | PRN

## 2013-05-28 NOTE — Telephone Encounter (Signed)
Script faxed.

## 2013-05-28 NOTE — Telephone Encounter (Signed)
Ok to refill,  Authorized in epic 

## 2013-06-03 ENCOUNTER — Encounter (INDEPENDENT_AMBULATORY_CARE_PROVIDER_SITE_OTHER): Payer: Self-pay

## 2013-06-03 ENCOUNTER — Encounter: Payer: Self-pay | Admitting: Adult Health

## 2013-06-03 ENCOUNTER — Ambulatory Visit (INDEPENDENT_AMBULATORY_CARE_PROVIDER_SITE_OTHER): Payer: BC Managed Care – PPO | Admitting: Adult Health

## 2013-06-03 VITALS — BP 160/58 | HR 60 | Temp 97.8°F | Resp 12 | Wt 171.0 lb

## 2013-06-03 DIAGNOSIS — R109 Unspecified abdominal pain: Secondary | ICD-10-CM | POA: Insufficient documentation

## 2013-06-03 DIAGNOSIS — R1012 Left upper quadrant pain: Secondary | ICD-10-CM

## 2013-06-03 LAB — CBC WITH DIFFERENTIAL/PLATELET
Basophils Absolute: 0 10*3/uL (ref 0.0–0.1)
Basophils Relative: 0.4 % (ref 0.0–3.0)
Eosinophils Absolute: 0.1 10*3/uL (ref 0.0–0.7)
Lymphocytes Relative: 16.6 % (ref 12.0–46.0)
MCHC: 32.9 g/dL (ref 30.0–36.0)
Neutrophils Relative %: 75.8 % (ref 43.0–77.0)
RBC: 3.54 Mil/uL — ABNORMAL LOW (ref 3.87–5.11)
RDW: 15.6 % — ABNORMAL HIGH (ref 11.5–14.6)
WBC: 9.1 10*3/uL (ref 4.5–10.5)

## 2013-06-03 LAB — COMPREHENSIVE METABOLIC PANEL
ALT: 13 U/L (ref 0–35)
AST: 18 U/L (ref 0–37)
Albumin: 3.4 g/dL — ABNORMAL LOW (ref 3.5–5.2)
Alkaline Phosphatase: 90 U/L (ref 39–117)
BUN: 33 mg/dL — ABNORMAL HIGH (ref 6–23)
CO2: 26 mEq/L (ref 19–32)
Calcium: 8.6 mg/dL (ref 8.4–10.5)
Chloride: 103 mEq/L (ref 96–112)
Creatinine, Ser: 1.9 mg/dL — ABNORMAL HIGH (ref 0.4–1.2)
GFR: 27.05 mL/min — ABNORMAL LOW (ref >60.00)
Glucose, Bld: 116 mg/dL — ABNORMAL HIGH (ref 70–99)
Potassium: 4.1 mEq/L (ref 3.5–5.1)
Sodium: 139 mEq/L (ref 135–145)
Total Bilirubin: 0.8 mg/dL (ref 0.3–1.2)
Total Protein: 6.4 g/dL (ref 6.0–8.3)

## 2013-06-03 MED ORDER — CIPROFLOXACIN HCL 250 MG PO TABS: 250.0000 mg | ORAL_TABLET | Freq: Two times a day (BID) | ORAL | Status: DC

## 2013-06-03 MED ORDER — METRONIDAZOLE 500 MG PO TABS: 500.0000 mg | ORAL_TABLET | Freq: Three times a day (TID) | ORAL | Status: DC

## 2013-06-03 NOTE — Patient Instructions (Signed)
  Start Cipro 250 mg twice a day for 3 days  Flagyl 500 mg 3 times a day for 7 days  Clear liquid diet for 24 hours.  Advance diet as tolerated thereafter.

## 2013-06-03 NOTE — Assessment & Plan Note (Signed)
Abdominal pain left upper quadrant and lower quadrant. Began Thursday. History of diverticulitis. Constipated week prior. Treat empirically for diverticulitis. Start Cipro 250 mg twice a day x3 days and Flagyl 500 mg 3 times a day x7 days. Clear liquid diet x24 hours advanced diet as tolerated thereafter. Check CBC and Cmet

## 2013-06-03 NOTE — Progress Notes (Signed)
Subjective:    Patient ID: Melisssa Stricklin, female    DOB: 1928/10/01, 79 y.o.   MRN: [id number]  HPI  Patient is a pleasant 79 y/o female who presents with pain underneath the left side of her rib cage which began on Thursday. She went up to [facility], [place] on Thursday with a group of friends[place] She did not have a heavy meal that evening. For breakfast on Thursday morning she had a biscuit. She denies eating any nuts or spicy foods. She has a hx of diverticulitis. She reports the area is sore (not really painful). She reports being constipated the week prior. She also recalls that she has been eating a lot of grapes. Denies fever, chills, blood in stool.    Current Outpatient Prescriptions on File Prior to Visit  Medication Sig Dispense Refill  . acetaminophen (TYLENOL) 650 MG CR tablet Take 650 mg by mouth every 8 (eight) hours as needed.      Marland Kitchen albuterol (PROVENTIL HFA;VENTOLIN HFA) 108 (90 BASE) MCG/ACT inhaler Inhale 2 puffs into the lungs every 6 (six) hours as needed for wheezing.  1 Inhaler  11  . ALPRAZolam (XANAX) 0.5 MG tablet Take 1 tablet (0.5 mg total) by mouth 2 (two) times daily as needed for anxiety.  30 tablet  3  . Beclomethasone Dipropionate 80 MCG/ACT AERS Place 2 sprays into the nose daily.  8.7 g  11  . cyanocobalamin ([facility]S VITAMIN B12) 2000 MCG tablet Take 1 tablet (2,000 mcg total) by mouth daily.  30 tablet  1  . DiphenhydrAMINE HCl (BENADRYL ALLERGY PO) Take 1 tablet by mouth at bedtime. Take by mouth as needed.      . fexofenadine (ALLEGRA) 180 MG tablet Take 180 mg by mouth every morning.      . fluticasone (FLONASE) 50 MCG/ACT nasal spray Place 2 sprays into the nose daily.  16 g  6  . furosemide (LASIX) 40 MG tablet Take 40 mg by mouth 2 (two) times daily. X 5 days, then back to one tablet daily      . gabapentin (NEURONTIN) 100 MG capsule Take 1 capsule (100 mg total) by mouth 3 (three) times daily.  90 capsule  3  . hydrALAZINE  (APRESOLINE) 25 MG tablet Take 1 tablet (25 mg total) by mouth 3 (three) times daily.  30 tablet  3  . HYDROcodone-acetaminophen (NORCO/VICODIN) 5-325 MG per tablet Take 1 tablet by mouth every 6 (six) hours as needed for pain.  120 tablet  4  . Lactulose 20 GM/30ML SOLN Take 30 mLs (20 g total) by mouth daily. As needed for constipation , may repeat in 6 hours if needed  240 mL  0  . levothyroxine (SYNTHROID, LEVOTHROID) 88 MCG tablet Take 1 tablet (88 mcg total) by mouth daily.  30 tablet  8  . loperamide (IMODIUM A-D) 2 MG tablet Take 2 mg by mouth 4 (four) times daily as needed.        Marland Kitchen losartan (COZAAR) 100 MG tablet Take 1 tablet (100 mg total) by mouth daily.  30 tablet  3  . metoprolol (LOPRESSOR) 50 MG tablet Take 1 tablet (50 mg total) by mouth 2 (two) times daily.  60 tablet  5  . mometasone-formoterol (DULERA) 100-5 MCG/ACT AERO Inhale 2 puffs into the lungs 2 (two) times daily.  13 g  11  . ondansetron (ZOFRAN) 4 MG tablet Take 4 mg by mouth every 4 (four)  hours as needed. For nausea and vomiting      . pantoprazole (PROTONIX) 40 MG tablet Take 1 tablet (40 mg total) by mouth 2 (two) times daily before a meal.  60 tablet  5  . pramipexole (MIRAPEX) 0.125 MG tablet Take 1 tablet (0.125 mg total) by mouth 3 (three) times daily.  30 tablet  11  . sertraline (ZOLOFT) 50 MG tablet Take 1 tablet (50 mg total) by mouth daily.  90 tablet  3  . traMADol (ULTRAM) 50 MG tablet Take 1 tablet (50 mg total) by mouth every 6 (six) hours as needed.  120 tablet  2   Current Facility-Administered Medications on File Prior to Visit  Medication Dose Route Frequency Provider Last Rate Last Dose  . albuterol (PROVENTIL) (2.5 MG/3ML) 0.083% nebulizer solution 2.5 mg  2.5 mg Nebulization Once [name], MD         Review of Systems  Constitutional: Negative for fever and chills.  Respiratory: Negative.   Cardiovascular: Negative.   Gastrointestinal: Positive for abdominal pain, diarrhea,  constipation and abdominal distention. Negative for nausea and vomiting.       Objective:   Physical Exam  Constitutional: She is oriented to person, place, and time. She appears well-developed and well-nourished. No distress.  Cardiovascular: Normal rate and regular rhythm.   Pulmonary/Chest: Effort normal. No respiratory distress.  Abdominal: Soft. She exhibits distension. She exhibits no mass. There is tenderness. There is no rebound and no guarding.  Hyperactive bowel sounds  Musculoskeletal: Normal range of motion.  Neurological: She is alert and oriented to person, place, and time.  Skin: Skin is warm.  Psychiatric: She has a normal mood and affect. Her behavior is normal. Judgment and thought content normal.    BP 160/58  Pulse 60  Temp(Src) 97.8 F (36.6 C) (Oral)  Resp 12  Wt 171 lb (77.565 kg)  BMI 33.4 kg/m2  SpO2 98%       Assessment & Plan:

## 2013-06-10 ENCOUNTER — Encounter: Payer: Self-pay | Admitting: Adult Health

## 2013-06-10 ENCOUNTER — Ambulatory Visit (INDEPENDENT_AMBULATORY_CARE_PROVIDER_SITE_OTHER): Payer: BC Managed Care – PPO | Admitting: Adult Health

## 2013-06-10 VITALS — BP 152/60 | HR 55 | Temp 97.9°F | Resp 16 | Wt 168.0 lb

## 2013-06-10 DIAGNOSIS — R109 Unspecified abdominal pain: Secondary | ICD-10-CM

## 2013-06-10 NOTE — Patient Instructions (Signed)
Diverticulitis  Small pockets or "bubbles" can develop in the wall of the intestine. Diverticulitis is when those pockets become infected and inflamed. This causes stomach pain (usually on the left side).  HOME CARE   Take all medicine as told by your doctor.   Try a clear liquid diet (broth, tea, or water) for as long as told by your doctor.   Keep all follow-up visits with your doctor.   You may be put on a low-fiber diet once you start feeling better. Here are foods that have low-fiber:   White breads, cereals, rice, and pasta.   Cooked fruits and vegetables or soft fresh fruits and vegetables without the skin.   Ground or well-cooked tender beef, ham, veal, lamb, pork, or poultry.   Eggs and seafood.   After you are doing well on the low-fiber diet, you may be put on a high-fiber diet. Here are ways to increase your fiber:   Choose whole-grain breads, cereals, pasta, and brown rice.   Choose fruits and vegetables with skin on. Do not overcook the vegetables.   Choose nuts, seeds, legumes, dried peas, beans, and lentils.   Look for food products that have more than 3 grams of fiber per serving on the food label.  GET HELP RIGHT AWAY IF:   Your pain does not get better or gets worse.   You have trouble eating food.   You are not pooping (having bowel movements) like normal.   You have a temperature by mouth above 102 F (38.9 C), not controlled by medicine.   You keep throwing up (vomiting).   You have bloody or black, tarry poop (stools).   You are getting worse and not better.  MAKE SURE YOU:    Understand these instructions.   Will watch your condition.   Will get help right away if you are not doing well or get worse.  Document Released: 01/11/2008 Document Revised: 10/17/2011 Document Reviewed: 06/15/2009  [employer] Patient Information 2014 [employer]

## 2013-06-10 NOTE — Progress Notes (Signed)
Subjective:    Patient ID: Savannah Wilson, female    DOB: 1928-12-12, 79 y.o.   MRN: [id number]  HPI  Patient is a pleasant 79 y/o female who was recently seen in clinic on 06/03/13 and treated for diverticulitis flare. She is status post antibiotic tx. Course completed. She presents today with complaints of Left quadrant pain with palpation. There is no pain if she does not palpate her abdomen. Denies fever, n/v, diarrhea or blood stools. She followed clear liquid diet and advanced to a bland diet.  Current Outpatient Prescriptions on File Prior to Visit  Medication Sig Dispense Refill  . acetaminophen (TYLENOL) 650 MG CR tablet Take 650 mg by mouth every 8 (eight) hours as needed.      Marland Kitchen albuterol (PROVENTIL HFA;VENTOLIN HFA) 108 (90 BASE) MCG/ACT inhaler Inhale 2 puffs into the lungs every 6 (six) hours as needed for wheezing.  1 Inhaler  11  . ALPRAZolam (XANAX) 0.5 MG tablet Take 1 tablet (0.5 mg total) by mouth 2 (two) times daily as needed for anxiety.  30 tablet  3  . Beclomethasone Dipropionate 80 MCG/ACT AERS Place 2 sprays into the nose daily.  8.7 g  11  . cyanocobalamin ([employer] VITAMIN B12) 2000 MCG tablet Take 1 tablet (2,000 mcg total) by mouth daily.  30 tablet  1  . DiphenhydrAMINE HCl (BENADRYL ALLERGY PO) Take 1 tablet by mouth at bedtime. Take by mouth as needed.      . fexofenadine (ALLEGRA) 180 MG tablet Take 180 mg by mouth every morning.      . fluticasone (FLONASE) 50 MCG/ACT nasal spray Place 2 sprays into the nose daily.  16 g  6  . furosemide (LASIX) 40 MG tablet Take 40 mg by mouth 2 (two) times daily. X 5 days, then back to one tablet daily      . gabapentin (NEURONTIN) 100 MG capsule Take 1 capsule (100 mg total) by mouth 3 (three) times daily.  90 capsule  3  . hydrALAZINE (APRESOLINE) 25 MG tablet Take 1 tablet (25 mg total) by mouth 3 (three) times daily.  30 tablet  3  . HYDROcodone-acetaminophen (NORCO/VICODIN) 5-325 MG per tablet Take 1 tablet by mouth every 6  (six) hours as needed for pain.  120 tablet  4  . Lactulose 20 GM/30ML SOLN Take 30 mLs (20 g total) by mouth daily. As needed for constipation , may repeat in 6 hours if needed  240 mL  0  . levothyroxine (SYNTHROID, LEVOTHROID) 88 MCG tablet Take 1 tablet (88 mcg total) by mouth daily.  30 tablet  8  . loperamide (IMODIUM A-D) 2 MG tablet Take 2 mg by mouth 4 (four) times daily as needed.        Marland Kitchen losartan (COZAAR) 100 MG tablet Take 1 tablet (100 mg total) by mouth daily.  30 tablet  3  . metoprolol (LOPRESSOR) 50 MG tablet Take 1 tablet (50 mg total) by mouth 2 (two) times daily.  60 tablet  5  . mometasone-formoterol (DULERA) 100-5 MCG/ACT AERO Inhale 2 puffs into the lungs 2 (two) times daily.  13 g  11  . ondansetron (ZOFRAN) 4 MG tablet Take 4 mg by mouth every 4 (four) hours as needed. For nausea and vomiting      . pantoprazole (PROTONIX) 40 MG tablet Take 1 tablet (40 mg total) by mouth 2 (two) times daily before a meal.  60 tablet  5  . pramipexole (MIRAPEX) 0.125 MG tablet Take  1 tablet (0.125 mg total) by mouth 3 (three) times daily.  30 tablet  11  . sertraline (ZOLOFT) 50 MG tablet Take 1 tablet (50 mg total) by mouth daily.  90 tablet  3  . traMADol (ULTRAM) 50 MG tablet Take 1 tablet (50 mg total) by mouth every 6 (six) hours as needed.  120 tablet  2   Current Facility-Administered Medications on File Prior to Visit  Medication Dose Route Frequency Provider Last Rate Last Dose  . albuterol (PROVENTIL) (2.5 MG/3ML) 0.083% nebulizer solution 2.5 mg  2.5 mg Nebulization Once [name], MD         Review of Systems  Constitutional: Negative for fever, chills and appetite change.  Respiratory: Negative.   Cardiovascular: Negative.   Gastrointestinal: Positive for abdominal pain. Negative for nausea, vomiting, diarrhea, constipation and blood in stool.       Objective:   Physical Exam  Abdominal: Soft.  Bowel sounds are hyperactive. She exhibits no pain other than with  palpation.          Assessment & Plan:

## 2013-06-10 NOTE — Assessment & Plan Note (Signed)
Symptoms have improved other than abdominal tenderness remains on the left side. No fever, n/v/d, bloody stools. Recommend bland diet. Tylenol for mild discomfort. RTC if fever, constipation, blood in stool or n/v.

## 2013-06-21 ENCOUNTER — Ambulatory Visit (INDEPENDENT_AMBULATORY_CARE_PROVIDER_SITE_OTHER): Payer: BC Managed Care – PPO | Admitting: Adult Health

## 2013-06-21 ENCOUNTER — Encounter: Payer: Self-pay | Admitting: Adult Health

## 2013-06-21 ENCOUNTER — Ambulatory Visit: Payer: Self-pay | Admitting: Adult Health

## 2013-06-21 VITALS — BP 136/82 | HR 62 | Temp 97.9°F | Resp 16 | Ht 62.0 in | Wt 169.0 lb

## 2013-06-21 DIAGNOSIS — R109 Unspecified abdominal pain: Secondary | ICD-10-CM

## 2013-06-21 NOTE — Assessment & Plan Note (Signed)
Send for CT scan of abdomen for ongoing pain. Last creatinine 1.9 Patient is s/p antibiotic tx for diverticulitis.

## 2013-06-21 NOTE — Progress Notes (Signed)
Pre visit review using our clinic review tool, if applicable. No additional management support is needed unless otherwise documented below in the visit note. 

## 2013-06-21 NOTE — Progress Notes (Signed)
  Subjective:    Patient ID: Addis Bobik, female    DOB: [birth year], 79 y.o.   MRN: [id number]  HPI  Patient is a very pleasant 79 year old female who presents to clinic with ongoing left-sided abdominal pain. She denies fever, constipation, diarrhea or blood in her stool. She has been treated with antibiotics for diverticulitis. Symptoms ongoing.  Review of Systems  Gastrointestinal: Positive for abdominal pain. Negative for diarrhea, constipation and blood in stool.       Left abdominal sided pain.       Objective:   Physical Exam  Constitutional: She is oriented to person, place, and time.  Cardiovascular: Normal rate and regular rhythm.   Pulmonary/Chest: Effort normal. No respiratory distress.  Abdominal: Soft. Bowel sounds are normal. There is tenderness.  Left sided abdominal tenderness  Neurological: She is alert and oriented to person, place, and time.  Psychiatric: She has a normal mood and affect. Her behavior is normal. Judgment and thought content normal.   BP 136/82  Pulse 62  Temp(Src) 97.9 F (36.6 C) (Oral)  Resp 16  Ht 5\' 2"  (1.575 m)  Wt 169 lb (76.658 kg)  BMI 30.90 kg/m2  SpO2 97%        Assessment & Plan:

## 2013-06-22 ENCOUNTER — Telehealth: Payer: Self-pay | Admitting: Internal Medicine

## 2013-06-22 ENCOUNTER — Other Ambulatory Visit: Payer: Self-pay | Admitting: Internal Medicine

## 2013-06-22 DIAGNOSIS — K654 Sclerosing mesenteritis: Secondary | ICD-10-CM

## 2013-06-22 DIAGNOSIS — M889 Osteitis deformans of unspecified bone: Secondary | ICD-10-CM

## 2013-06-22 NOTE — Telephone Encounter (Signed)
Savannah Wilson  Can you ask Savannah Wilson to come in for some nonfasting bloodwork at her earliest convenience?  Her CT scan ordered by Raquel suggest that her pain may be coming from inflammation of her abdominal fat, but it also showed some abnormalities of the bones which may be causing her pain. It may be a condition called Paget's disease,  Which can cause bone pain and fractures, so I need  to explore both of these possible diagnoses a little with blood tests and  Bone density test to see which way to go.

## 2013-06-22 NOTE — Telephone Encounter (Signed)
Message copied by [name] on Sat Jun 22, 2013  4:12 PM ------      Message from: Sherryl Barters      Created: Fri Jun 21, 2013  6:57 PM       Savannah Wilson saw Savannah Wilson today for ongoing c/o left quadrant abd pain. I have seen her a couple of times for this. I sent her for CT without contract because creatinine elevated. They just called me report and it has no acute findings but multiple issues including:            Small bowel diverticulosis      Trace bil pleural effusion      Hiatal hernia      2 cm lesion left mid kidney grown over 7 years may represent cyst however they recommend contrast CT which I doubt we can do 2/2 kidney      Umbilical hernia      ? Panniculitis cannot be excluded      Appearance of Paget's dz of bone            They will probably put this report in my box. Can you please check behind me to make sure I have not missed something acute?            Thanks,      Raquel                   ------

## 2013-06-24 ENCOUNTER — Other Ambulatory Visit (INDEPENDENT_AMBULATORY_CARE_PROVIDER_SITE_OTHER): Payer: BC Managed Care – PPO

## 2013-06-24 ENCOUNTER — Other Ambulatory Visit: Payer: BC Managed Care – PPO

## 2013-06-24 DIAGNOSIS — M889 Osteitis deformans of unspecified bone: Secondary | ICD-10-CM

## 2013-06-24 DIAGNOSIS — K654 Sclerosing mesenteritis: Secondary | ICD-10-CM

## 2013-06-24 LAB — CALCIUM, IONIZED: Calcium, Ion: 1.19 mmol/L (ref 1.12–1.32)

## 2013-06-24 NOTE — Telephone Encounter (Signed)
Pt notified. Scheduled for bloodwork today.

## 2013-06-25 ENCOUNTER — Other Ambulatory Visit: Payer: Self-pay | Admitting: *Deleted

## 2013-06-25 LAB — BASIC METABOLIC PANEL
BUN: 26 mg/dL — ABNORMAL HIGH (ref 6–23)
Calcium: 8.8 mg/dL (ref 8.4–10.5)
Chloride: 103 mEq/L (ref 96–112)
Creatinine, Ser: 1.8 mg/dL — ABNORMAL HIGH (ref 0.4–1.2)
GFR: 28.99 mL/min — ABNORMAL LOW (ref >60.00)
Potassium: 3.9 mEq/L (ref 3.5–5.1)
Sodium: 137 mEq/L (ref 135–145)

## 2013-06-25 LAB — SEDIMENTATION RATE: Sed Rate: 43 mm/hr — ABNORMAL HIGH (ref 0–22)

## 2013-06-25 LAB — PARATHYROID HORMONE, INTACT (NO CA): PTH: 99.4 pg/mL — ABNORMAL HIGH (ref 14.0–72.0)

## 2013-06-25 LAB — ALKALINE PHOSPHATASE: Alkaline Phosphatase: 85 U/L (ref 39–117)

## 2013-06-25 LAB — C-REACTIVE PROTEIN: CRP: 0.6 mg/dL (ref 0.5–20.0)

## 2013-06-25 LAB — VITAMIN D 25 HYDROXY (VIT D DEFICIENCY, FRACTURES): Vit D, 25-Hydroxy: 34 ng/mL (ref 30–89)

## 2013-06-25 MED ORDER — FUROSEMIDE 40 MG PO TABS: 40.0000 mg | ORAL_TABLET | Freq: Two times a day (BID) | ORAL | Status: DC

## 2013-07-03 ENCOUNTER — Ambulatory Visit (INDEPENDENT_AMBULATORY_CARE_PROVIDER_SITE_OTHER): Payer: BC Managed Care – PPO | Admitting: Internal Medicine

## 2013-07-03 ENCOUNTER — Encounter: Payer: Self-pay | Admitting: Internal Medicine

## 2013-07-03 VITALS — BP 188/60 | HR 60 | Temp 97.5°F | Resp 12 | Ht 62.0 in | Wt 166.5 lb

## 2013-07-03 DIAGNOSIS — N281 Cyst of kidney, acquired: Secondary | ICD-10-CM

## 2013-07-03 DIAGNOSIS — R109 Unspecified abdominal pain: Secondary | ICD-10-CM

## 2013-07-03 DIAGNOSIS — F411 Generalized anxiety disorder: Secondary | ICD-10-CM

## 2013-07-03 DIAGNOSIS — R937 Abnormal findings on diagnostic imaging of other parts of musculoskeletal system: Secondary | ICD-10-CM

## 2013-07-03 NOTE — Progress Notes (Signed)
Patient ID: Alixandra Biba, female   DOB: 17-Sep-1928, 79 y.o.   MRN: [id number]  Patient Active Problem List   Diagnosis Date Noted  . Anxiety state, unspecified 07/05/2013  . Abdominal  pain, other specified site 06/03/2013  . Need for prophylactic vaccination and inoculation against influenza 05/07/2013  . Fall 03/04/2013  . Allergic rhinitis 01/11/2013  . Dyspnea on exertion 01/11/2013  . Bradycardia, drug induced 08/26/2012  . Renal cysts, acquired, bilateral 08/26/2012  . Pleural effusion, bilateral 08/26/2012  . B12 deficiency anemia 06/24/2012  . Neuropathy 05/22/2012  . Vestibular dizziness 05/22/2012  . History of pancreatitis   . Lower extremity pain, anterior 12/15/2011  . Hypertension, uncontrolled 09/15/2011  . Restless legs syndrome 09/15/2011  . Systolic CHF, chronic 0000000  . Asthma   . Depression   . GERD (gastroesophageal reflux disease)   . Venous insufficiency   . Barrett esophagus   . CKD (chronic kidney disease), stage IV     Subjective:  CC:   Chief Complaint  Patient presents with  . Follow-up    3 month followup    HPI:   Koa Crickmore a 79 y.o. female who presents Follow up on persistent left sided abdominal pain despite treatment for presumed diverticulitis by RR.  Patient was sent for  CT of abdomen and pelvis which was done without contrast due to due to CKD and several abnormalities were noted including changes consistent with chronic left sided colitis, small bowel and sigmoid diverticulosis, bilateral small /trace pleural effusions, moderate to large hiatal hernia,  And slowly enlarging left mid kidney exophytic lesion which has slowly increased in size and is now 2 cm ( chronic, 7 yrs ,  Nephrology following with prior workup done 2013).  Addtionally ,  The appearance of her bones suggests Paget's disease so workup was recommended and ordered by me but her DEXA scan which was ordered on Nov 15th has not been scheduled by [facility] for unclear  reasons .  She is very anxious today because her daughter [name] is still in rehab after a prolonged hospitalization. Patient is having difficulty sleeping and frequently tearful. She is upset because she did not get the results of her CT scan or her labs for 2 days, although the message was left on her answering machine.  She is accompanied by dtr Tammy.    Her L sided ABDOMINAL Pain is still present.  It is not severe or brought on by movement or defecating.  She reports occasional loose stools,  Which are chronic but do not occur daily ,  She does  use  Laxatives ,  She reports increased flatus.       Past Medical History  Diagnosis Date  . Hypothyroid   . Hyperlipidemia   . Diverticulosis   . Peripheral neuralgia   . Hyperkalemia   . Hypertension   . Asthma   . Depression   . GERD (gastroesophageal reflux disease)   . Venous insufficiency   . Barrett esophagus   . Chronic bronchitis   . Diverticular disease   . Renal insufficiency   . Gout   . Pancreatitis due to biliary obstruction March 2010    s/p ERCP sphincterotomy, cholecystectomy    Past Surgical History  Procedure Laterality Date  . Ventral hernia repair    . Cataract extraction      right  . Cholecystectomy  02/10  . Vesicovaginal fistula closure w/ tah    . Tonsillectomy    . Ventral hernia  repair  2007  . Cataract extraction  2008  . Cholecystectomy  2010  . Carpal tunnel release  jan 2013    [name]    . albuterol  2.5 mg Nebulization Once     The following portions of the patient's history were reviewed and updated as appropriate: Allergies, current medications, and problem list.    Review of Systems:   12 Pt  review of systems was negative except those addressed in the HPI,     History   Social History  . Marital Status: Widowed    Spouse Name: N/A    Number of Children: 2  . Years of Education: N/A   Occupational History  . 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 Smoking status: Never Smoker   . Smokeless tobacco: Never Used  . Alcohol Use: Yes     Comment: rare  . Drug Use: No  . Sexual Activity: Not on file   Other Topics Concern  . Not on file   Social History Narrative   ** Merged History Encounter **       Widowed. Has 2 children and lives alone.    Objective:  Filed Vitals:   07/03/13 0830  BP: 188/60  Pulse: 60  Temp: 97.5 F (36.4 C)  Resp: 12     General appearance:  Anxious, tearful, alert, cooperative and appears stated age Ears: normal TM's and external ear canals both ears Throat: lips, mucosa, and tongue normal; teeth and gums normal Neck: no adenopathy, no carotid bruit, supple, symmetrical, trachea midline and thyroid not enlarged, symmetric, no tenderness/mass/nodules Back: symmetric, no curvature. ROM normal. No CVA tenderness. Lungs: clear to auscultation bilaterally Heart: regular rate and rhythm, S1, S2 normal, no murmur, click, rub or gallop Abdomen: soft, tender in Left mid flank, ; bowel sounds normal; no masses,  Guarding or  organomegaly Pulses: 2+ and symmetric Skin: Skin color, texture, turgor normal. No rashes or lesions Lymph nodes: Cervical, supraclavicular, and axillary nodes normal.  Assessment and Plan:  Abdominal  pain, other specified site Etiology unclear.  Recent  noncontrasted CT created more questions than answers, and etiology now includes chronic colitis, left renal mass and Pagets Disease. She has known diverticulosis but empiric treatment with antibiotics and bowel rest did not resolve symptoms.  Referral to Dr. Vira Agar discussed. Dietary enzymes discussed as well as prn Gas X for flatulence.   Anxiety state, unspecified Aggravated by daughter Pam's declining health, with insomnia,  Increased tearfulness and frequent medical evaluations noted.  Urged to use the alprazolam I have her for insomnia and prn once daily .    Abnormal bone radiograph Paget's disease suggested by  appearance of bones on recent CT.  Patient does have CKD and secondary hyperparathyroidism, which may explain changes seen.  Vit D , alk phos and ionized calcium are all normal. Will obtain DEXA scan.  Renal cysts, acquired, bilateral 2 cm exophytic complex cyst noted on left kidney, which has enlarged per Radiology report.  Nephrology has been following this .  Will send copy of CT report to dr. Holley Raring.   A total of 40 minutes was spent with patient more than half of which was spent in counseling, reviewing records from other prviders and coordination of care.  Updated Medication List Outpatient Encounter Prescriptions as of 07/03/2013  Medication Sig  . albuterol (PROVENTIL HFA;VENTOLIN HFA) 108 (90 BASE) MCG/ACT inhaler Inhale 2 puffs into the lungs every 6 (six)  hours as needed for wheezing.  . cyanocobalamin ([employer] VITAMIN B12) 2000 MCG tablet Take 1 tablet (2,000 mcg total) by mouth daily.  . DiphenhydrAMINE HCl (BENADRYL ALLERGY PO) Take 1 tablet by mouth at bedtime. Take by mouth as needed.  . furosemide (LASIX) 40 MG tablet Take 1 tablet (40 mg total) by mouth 2 (two) times daily. X 5 days, then back to one tablet daily  . hydrALAZINE (APRESOLINE) 25 MG tablet Take 1 tablet (25 mg total) by mouth 3 (three) times daily.  Marland Kitchen levothyroxine (SYNTHROID, LEVOTHROID) 88 MCG tablet Take 1 tablet (88 mcg total) by mouth daily.  . metoprolol (LOPRESSOR) 50 MG tablet Take 1 tablet (50 mg total) by mouth 2 (two) times daily.  . mometasone-formoterol (DULERA) 100-5 MCG/ACT AERO Inhale 2 puffs into the lungs 2 (two) times daily.  . ondansetron (ZOFRAN) 4 MG tablet Take 4 mg by mouth every 4 (four) hours as needed. For nausea and vomiting  . pantoprazole (PROTONIX) 40 MG tablet Take 1 tablet (40 mg total) by mouth 2 (two) times daily before a meal.  . traMADol (ULTRAM) 50 MG tablet Take 1 tablet (50 mg total) by mouth every 6 (six) hours as needed.  Marland Kitchen VITAMIN D, ERGOCALCIFEROL, PO Take 1.25 mg by mouth  every 30 (thirty) days.  Marland Kitchen acetaminophen (TYLENOL) 650 MG CR tablet Take 650 mg by mouth every 8 (eight) hours as needed.  . ALPRAZolam (XANAX) 0.5 MG tablet Take 1 tablet (0.5 mg total) by mouth 2 (two) times daily as needed for anxiety.  . Beclomethasone Dipropionate 80 MCG/ACT AERS Place 2 sprays into the nose daily.  . fexofenadine (ALLEGRA) 180 MG tablet Take 180 mg by mouth every morning.  . fluticasone (FLONASE) 50 MCG/ACT nasal spray Place 2 sprays into the nose daily.  Marland Kitchen gabapentin (NEURONTIN) 100 MG capsule Take 1 capsule (100 mg total) by mouth 3 (three) times daily.  Marland Kitchen HYDROcodone-acetaminophen (NORCO/VICODIN) 5-325 MG per tablet Take 1 tablet by mouth every 6 (six) hours as needed for pain.  . Lactulose 20 GM/30ML SOLN Take 30 mLs (20 g total) by mouth daily. As needed for constipation , may repeat in 6 hours if needed  . loperamide (IMODIUM A-D) 2 MG tablet Take 2 mg by mouth 4 (four) times daily as needed.    Marland Kitchen losartan (COZAAR) 100 MG tablet Take 1 tablet (100 mg total) by mouth daily.  . pramipexole (MIRAPEX) 0.125 MG tablet Take 1 tablet (0.125 mg total) by mouth 3 (three) times daily.  . sertraline (ZOLOFT) 50 MG tablet Take 1 tablet (50 mg total) by mouth daily.

## 2013-07-03 NOTE — Progress Notes (Signed)
Pre-visit discussion using our clinic review tool. No additional management support is needed unless otherwise documented below in the visit note.  

## 2013-07-03 NOTE — Patient Instructions (Addendum)
We will get the DEXA scan set up at [facility] using the alprazolam at bedtime to get some rest  You can   use 1/2 alprazolam during the day if needed for anxiety   Slow down you eat,  Try eating smaller more frequent meals  Try GasX for bloating  Try Beano drops with any meal containing greens   Referral to Dr Vira Agar for evaluation of the chronic colitis

## 2013-07-05 ENCOUNTER — Encounter: Payer: Self-pay | Admitting: Internal Medicine

## 2013-07-05 DIAGNOSIS — R937 Abnormal findings on diagnostic imaging of other parts of musculoskeletal system: Secondary | ICD-10-CM | POA: Insufficient documentation

## 2013-07-05 DIAGNOSIS — F411 Generalized anxiety disorder: Secondary | ICD-10-CM | POA: Insufficient documentation

## 2013-07-05 NOTE — Assessment & Plan Note (Addendum)
Etiology unclear.  Recent  noncontrasted CT created more questions than answers, and etiology now includes chronic colitis, left renal mass and Pagets Disease. She has known diverticulosis but empiric treatment with antibiotics and bowel rest did not resolve symptoms.  Referral to Dr. Vira Agar discussed. Dietary enzymes discussed as well as prn Gas X for flatulence.

## 2013-07-05 NOTE — Assessment & Plan Note (Signed)
Aggravated by daughter Pam's declining health, with insomnia,  Increased tearfulness and frequent medical evaluations noted.  Urged to use the alprazolam I have her for insomnia and prn once daily .

## 2013-07-05 NOTE — Assessment & Plan Note (Signed)
2 cm exophytic complex cyst noted on left kidney, which has enlarged per Radiology report.  Nephrology has been following this

## 2013-07-05 NOTE — Assessment & Plan Note (Addendum)
Paget's disease suggested by appearance of bones on recent CT.  Patient does have CKD and secondary hyperparathyroidism, which may explain changes seen.  Vit D , alk phos and ionized calcium are all normal. Will obtain DEXA scan.

## 2013-07-11 ENCOUNTER — Ambulatory Visit: Payer: Self-pay | Admitting: Internal Medicine

## 2013-07-15 ENCOUNTER — Encounter: Payer: Self-pay | Admitting: Adult Health

## 2013-07-16 ENCOUNTER — Encounter: Payer: Self-pay | Admitting: Emergency Medicine

## 2013-07-16 ENCOUNTER — Telehealth: Payer: Self-pay | Admitting: Internal Medicine

## 2013-07-16 DIAGNOSIS — R1032 Left lower quadrant pain: Secondary | ICD-10-CM

## 2013-07-16 DIAGNOSIS — R109 Unspecified abdominal pain: Secondary | ICD-10-CM

## 2013-07-16 NOTE — Telephone Encounter (Signed)
No referral in place.

## 2013-07-16 NOTE — Telephone Encounter (Signed)
Pt says she was wondering about a referral to Dr. Vira Agar ??? She says Dr. Derrel Nip was going to refer her for side pain and after her CT Scan.

## 2013-07-16 NOTE — Telephone Encounter (Signed)
Pt notified of appt via [place]

## 2013-07-16 NOTE — Telephone Encounter (Signed)
Referral to Dr. Vira Agar  is in process as requested

## 2013-07-23 ENCOUNTER — Encounter: Payer: Self-pay | Admitting: Internal Medicine

## 2013-07-23 DIAGNOSIS — R937 Abnormal findings on diagnostic imaging of other parts of musculoskeletal system: Secondary | ICD-10-CM

## 2013-07-24 ENCOUNTER — Ambulatory Visit: Payer: BC Managed Care – PPO | Admitting: Internal Medicine

## 2013-07-24 ENCOUNTER — Telehealth: Payer: Self-pay | Admitting: Emergency Medicine

## 2013-07-24 NOTE — Telephone Encounter (Signed)
Pt stating she had rec'd a call from someone and her phone cut them off. Please return the call.

## 2013-07-24 NOTE — Telephone Encounter (Signed)
Pt advised we are checking on bone density results from [facility] [place] [phone]

## 2013-07-26 NOTE — Telephone Encounter (Signed)
Results received and are placed in folder for review.

## 2013-07-30 ENCOUNTER — Telehealth: Payer: Self-pay | Admitting: Internal Medicine

## 2013-07-30 LAB — HM DEXA SCAN

## 2013-07-30 NOTE — Telephone Encounter (Signed)
Bone Density scores received, she has mild to moderate osteopenia (bone loss) in the hips and increased density in the spine which is usually due to arthritic changes.  Continue calcium, vitamin d and weight bearing exercise on a regular basis.

## 2013-07-30 NOTE — Telephone Encounter (Signed)
Pt notified via myChart, see other telephone note

## 2013-07-30 NOTE — Telephone Encounter (Signed)
Sent mychart message with results

## 2013-08-02 NOTE — Telephone Encounter (Signed)
Mailed unread message to pt  

## 2013-08-26 LAB — TSH: TSH: 6.56 u[IU]/mL — AB (ref 0.41–5.90)

## 2013-09-03 ENCOUNTER — Telehealth: Payer: Self-pay | Admitting: Internal Medicine

## 2013-09-03 ENCOUNTER — Other Ambulatory Visit: Payer: Self-pay | Admitting: Internal Medicine

## 2013-09-03 NOTE — Telephone Encounter (Signed)
Have you seen these labs?

## 2013-09-03 NOTE — Telephone Encounter (Signed)
The patient had labs done at [facility]). According to her labs her thyroid level was low and they informed the patient that they would send over her labs report in order for Dr. Derrel Nip to follow up with the patient on her elevated TSH.

## 2013-09-03 NOTE — Telephone Encounter (Signed)
No I have not received them yet.

## 2013-09-06 ENCOUNTER — Telehealth: Payer: Self-pay | Admitting: Internal Medicine

## 2013-09-06 MED ORDER — LEVOTHYROXINE SODIUM 100 MCG PO TABS: 100.0000 ug | ORAL_TABLET | Freq: Every day | ORAL | Status: DC

## 2013-09-06 NOTE — Telephone Encounter (Signed)
Spoke to pt told her we have not received her labs yet. She may want to call and have them refax them. Pt verbalized understanding.

## 2013-09-13 ENCOUNTER — Ambulatory Visit: Payer: Self-pay | Admitting: Unknown Physician Specialty

## 2013-09-16 LAB — PATHOLOGY REPORT

## 2013-09-23 ENCOUNTER — Other Ambulatory Visit: Payer: Self-pay | Admitting: Internal Medicine

## 2013-09-23 NOTE — Telephone Encounter (Signed)
Last OV 07/03/13, ok refill?

## 2013-09-24 ENCOUNTER — Telehealth: Payer: Self-pay | Admitting: Internal Medicine

## 2013-09-24 DIAGNOSIS — K225 Diverticulum of esophagus, acquired: Secondary | ICD-10-CM | POA: Insufficient documentation

## 2013-09-24 DIAGNOSIS — K219 Gastro-esophageal reflux disease without esophagitis: Secondary | ICD-10-CM | POA: Insufficient documentation

## 2013-09-24 DIAGNOSIS — K2270 Barrett's esophagus without dysplasia: Secondary | ICD-10-CM

## 2013-09-24 DIAGNOSIS — K449 Diaphragmatic hernia without obstruction or gangrene: Secondary | ICD-10-CM

## 2013-09-24 NOTE — Assessment & Plan Note (Signed)
Patient to see dr Vira Agar next week to discuss treatment

## 2013-09-24 NOTE — Telephone Encounter (Signed)
Ok to refill,  Authorized in epic 

## 2013-10-04 ENCOUNTER — Ambulatory Visit (INDEPENDENT_AMBULATORY_CARE_PROVIDER_SITE_OTHER): Payer: Medicare Other | Admitting: Internal Medicine

## 2013-10-04 ENCOUNTER — Encounter: Payer: Self-pay | Admitting: Internal Medicine

## 2013-10-04 ENCOUNTER — Telehealth: Payer: Self-pay | Admitting: Internal Medicine

## 2013-10-04 VITALS — BP 124/70 | HR 54 | Temp 97.4°F | Resp 16 | Wt 172.5 lb

## 2013-10-04 DIAGNOSIS — E039 Hypothyroidism, unspecified: Secondary | ICD-10-CM

## 2013-10-04 DIAGNOSIS — F32A Depression, unspecified: Secondary | ICD-10-CM

## 2013-10-04 DIAGNOSIS — R001 Bradycardia, unspecified: Secondary | ICD-10-CM

## 2013-10-04 DIAGNOSIS — N039 Chronic nephritic syndrome with unspecified morphologic changes: Secondary | ICD-10-CM

## 2013-10-04 DIAGNOSIS — K2270 Barrett's esophagus without dysplasia: Secondary | ICD-10-CM

## 2013-10-04 DIAGNOSIS — F329 Major depressive disorder, single episode, unspecified: Secondary | ICD-10-CM

## 2013-10-04 DIAGNOSIS — I5022 Chronic systolic (congestive) heart failure: Secondary | ICD-10-CM

## 2013-10-04 DIAGNOSIS — E785 Hyperlipidemia, unspecified: Secondary | ICD-10-CM

## 2013-10-04 DIAGNOSIS — I498 Other specified cardiac arrhythmias: Secondary | ICD-10-CM

## 2013-10-04 DIAGNOSIS — Z23 Encounter for immunization: Secondary | ICD-10-CM

## 2013-10-04 DIAGNOSIS — K219 Gastro-esophageal reflux disease without esophagitis: Secondary | ICD-10-CM

## 2013-10-04 DIAGNOSIS — K449 Diaphragmatic hernia without obstruction or gangrene: Secondary | ICD-10-CM

## 2013-10-04 DIAGNOSIS — I509 Heart failure, unspecified: Secondary | ICD-10-CM

## 2013-10-04 DIAGNOSIS — E669 Obesity, unspecified: Secondary | ICD-10-CM

## 2013-10-04 DIAGNOSIS — D519 Vitamin B12 deficiency anemia, unspecified: Secondary | ICD-10-CM

## 2013-10-04 DIAGNOSIS — N189 Chronic kidney disease, unspecified: Secondary | ICD-10-CM

## 2013-10-04 DIAGNOSIS — N184 Chronic kidney disease, stage 4 (severe): Secondary | ICD-10-CM

## 2013-10-04 DIAGNOSIS — D631 Anemia in chronic kidney disease: Secondary | ICD-10-CM

## 2013-10-04 DIAGNOSIS — F3289 Other specified depressive episodes: Secondary | ICD-10-CM

## 2013-10-04 DIAGNOSIS — T50905A Adverse effect of unspecified drugs, medicaments and biological substances, initial encounter: Secondary | ICD-10-CM

## 2013-10-04 DIAGNOSIS — D518 Other vitamin B12 deficiency anemias: Secondary | ICD-10-CM

## 2013-10-04 MED ORDER — ZOSTER VACCINE LIVE 19400 UNT/0.65ML ~~LOC~~ SOLR: 0.6500 mL | Freq: Once | SUBCUTANEOUS | Status: DC

## 2013-10-04 MED ORDER — METOPROLOL TARTRATE 25 MG PO TABS: 25.0000 mg | ORAL_TABLET | Freq: Two times a day (BID) | ORAL | Status: DC

## 2013-10-04 MED ORDER — TETANUS-DIPHTH-ACELL PERTUSSIS 5-2.5-18.5 LF-MCG/0.5 IM SUSP: 0.5000 mL | Freq: Once | INTRAMUSCULAR | Status: DC

## 2013-10-04 MED ORDER — OMEPRAZOLE 40 MG PO CPDR: 40.0000 mg | DELAYED_RELEASE_CAPSULE | Freq: Every day | ORAL | Status: DC

## 2013-10-04 NOTE — Progress Notes (Signed)
Patient ID: Savannah Wilson, female   DOB: June 17, 1929, 80 y.o.   MRN: [id number]   Patient Active Problem List   Diagnosis Date Noted  . Obesity (BMI 30.0-34.9) 10/06/2013  . Hiatal hernia with gastroesophageal reflux 09/24/2013  . Zenker's diverticulum 09/24/2013  . Anxiety state, unspecified 07/05/2013  . Abnormal bone radiograph 07/05/2013  . Need for prophylactic vaccination and inoculation against influenza 05/07/2013  . Allergic rhinitis 01/11/2013  . Dyspnea on exertion 01/11/2013  . Bradycardia, drug induced 08/26/2012  . Renal cysts, acquired, bilateral 08/26/2012  . Pleural effusion, bilateral 08/26/2012  . B12 deficiency anemia 06/24/2012  . Neuropathy 05/22/2012  . History of pancreatitis   . Hypertension, uncontrolled 09/15/2011  . Restless legs syndrome 09/15/2011  . Systolic CHF, chronic 0000000  . Asthma   . Depression   . Venous insufficiency   . Barrett esophagus   . CKD (chronic kidney disease), stage IV     Subjective:  CC:   Chief Complaint  Patient presents with  . Follow-up    3 month/ patient concerned about low heart rate and low O2 sat reported monday at Dr.  Elwyn Lade office    HPI:   Savannah Wilson is a 80 y.o. female who presents for Follow up on chronic issues including persistent left sided abdominal pain, systolic dysfunction complicated by venous insufficiency and CKD, stage 4 by recent GFR.   Since her last visit she had a GI evaluation and her abdominal pain has since resolved without intervention or change in therapy.  She had an EGD which demonstrated a Zenker's diverticulum, hiatal hernia and Barrett's esophagus.  Her Insurance, [employer] is requiring prior authorization to continue bid dosing of her protonix, which she has been taking for years per Dr. Percell Boston directions, but she is requesting our office to process the PA.    She is concerned about a recent low 02 saturation and bradycardia observed by her nephrologist's office during a  recent visit.  She denies shortness of breath at the time,  And the sat improved with deep breathing. She has had no orthopnea, syncope or wt gain reported .  She has a history of CHF with small bilaterala pleural effusions noted on prior chest radiographs. She takes metoprolol 50 mg bid and has had prior reductions for bradycardia    Past Medical History  Diagnosis Date  . Hypothyroid   . Hyperlipidemia   . Diverticulosis   . Peripheral neuralgia   . Hyperkalemia   . Hypertension   . Asthma   . Depression   . GERD (gastroesophageal reflux disease)   . Venous insufficiency   . Barrett esophagus   . Chronic bronchitis   . Diverticular disease   . Renal insufficiency   . Gout   . Pancreatitis due to biliary obstruction March 2010    s/p ERCP sphincterotomy, cholecystectomy    Past Surgical History  Procedure Laterality Date  . Ventral hernia repair    . Cataract extraction      right  . Cholecystectomy  02/10  . Vesicovaginal fistula closure w/ tah    . Tonsillectomy    . Ventral hernia repair  2007  . Cataract extraction  2008  . Cholecystectomy  2010  . Carpal tunnel release  jan 2013    [name]    . albuterol  2.5 mg Nebulization Once     The following portions of the patient's history were reviewed and updated as appropriate: Allergies, current medications, and problem list.  Review of Systems:   Patient denies headache, fevers, malaise, unintentional weight loss, skin rash, eye pain, sinus congestion and sinus pain, sore throat, dysphagia,  hemoptysis , cough, dyspnea, wheezing, chest pain, palpitations, orthopnea, edema, abdominal pain, nausea, melena, diarrhea, constipation, flank pain, dysuria, hematuria, urinary  Frequency, nocturia, numbness, tingling, seizures,  Focal weakness, Loss of consciousness,  Tremor, insomnia, depression, anxiety, and suicidal ideation.     History   Social History  . Marital Status: Widowed    Spouse Name: N/A     Number of Children: 2  . Years of Education: N/A   Occupational History  . 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 Smoking status: Never Smoker   . Smokeless tobacco: Never Used  . Alcohol Use: Yes     Comment: rare  . Drug Use: No  . Sexual Activity: Not on file   Other Topics Concern  . Not on file   Social History Narrative   ** Merged History Encounter **       Widowed. Has 2 children and lives alone.    Objective:  Filed Vitals:   10/04/13 1330  BP: 124/70  Pulse: 54  Temp: 97.4 F (36.3 C)  Resp: 16     General appearance: alert, cooperative and appears stated age Ears: normal TM's and external ear canals both ears Throat: lips, mucosa, and tongue normal; teeth and gums normal Neck: no adenopathy, no carotid bruit, supple, symmetrical, trachea midline and thyroid not enlarged, symmetric, no tenderness/mass/nodules Back: symmetric, no curvature. ROM normal. No CVA tenderness. Lungs: clear to auscultation bilaterally Heart: regular rate and rhythm, S1, S2 normal, no murmur, click, rub or gallop Abdomen: soft, non-tender; bowel sounds normal; no masses,  no organomegaly Pulses: 2+ and symmetric Skin: Skin color, texture, turgor normal. No rashes or lesions Lymph nodes: Cervical, supraclavicular, and axillary nodes normal.  Assessment and Plan:  Systolic CHF, chronic She remains asymptomatic and lungs are clear today.  Wt gain of 6 lbs noted since last visit. But givne normal exam and decreased GFR no changes to furosemide dosing was done.  Metoprolol dose reducee to 25 mg bid today for bradycardia.  Hiatal hernia with gastroesophageal reflux EGD 09/13/13 noted Barrett's,  No dysplasia, H pylori or malignancy. Continue protonix bid dosing    Bradycardia, drug induced Dose of metoprolol was reduced today to 25 mg bid.   Barrett esophagus She needs to continue twice daily dosing of protonix   PA needed for bid dosing  Has breakthrough  symptoms with once daily dosin g  Obesity (BMI 30.0-34.9) I have addressed  BMI and recommended a low glycemic index diet utilizing smaller more frequent meals to increase metabolism.  She is hindered from regular aerobic exercise by her multiple comorbidities   Anemia in chronic kidney disease (CKD) hgb was 9.5 on Monday at Dr. Elwyn Lade office and discussions of referral to Cancer center for "transfusion " were started.  Discussed with patient that most likely iron or EPO transfusions would be indicated given her only symptom of fatigue, which may improve with reduction in metoprolol dose  Unspecified hypothyroidism Thyroid function was under on 88 mcg in January and dose was incteased to 100 mcg daily.  Repeat TSH is due in late March  Depression Complicated by daughter Pam's declining health, with insomnia,  Increased tearfulness and frequent medical evaluations noted. She has stopped the sertraline . Urged to use the alprazolam I have her for insomnia and prn  once daily .      A total of 40 minutes was spent with patient more than half of which was spent in counseling, reviewing records from other prviders and coordination of care.  Updated Medication List Outpatient Encounter Prescriptions as of 10/04/2013  Medication Sig  . acetaminophen (TYLENOL) 650 MG CR tablet Take 650 mg by mouth every 8 (eight) hours as needed.  Marland Kitchen albuterol (PROVENTIL HFA;VENTOLIN HFA) 108 (90 BASE) MCG/ACT inhaler Inhale 2 puffs into the lungs every 6 (six) hours as needed for wheezing.  Marland Kitchen ALPRAZolam (XANAX) 0.5 MG tablet TAKE ONE TABLET BY MOUTH TWICE DAILY AS NEEDED  . Beclomethasone Dipropionate 80 MCG/ACT AERS Place 2 sprays into the nose daily.  . cyanocobalamin ([employer] VITAMIN B12) 2000 MCG tablet Take 1 tablet (2,000 mcg total) by mouth daily.  . DiphenhydrAMINE HCl (BENADRYL ALLERGY PO) Take 1 tablet by mouth at bedtime. Take by mouth as needed.  . fexofenadine (ALLEGRA) 180 MG tablet Take 180 mg by mouth  every morning.  . fluticasone (FLONASE) 50 MCG/ACT nasal spray Place 2 sprays into the nose daily.  . furosemide (LASIX) 40 MG tablet Take 1 tablet (40 mg total) by mouth 2 (two) times daily. X 5 days, then back to one tablet daily  . hydrALAZINE (APRESOLINE) 25 MG tablet Take 1 tablet (25 mg total) by mouth 3 (three) times daily.  . Lactulose 20 GM/30ML SOLN Take 30 mLs (20 g total) by mouth daily. As needed for constipation , may repeat in 6 hours if needed  . levothyroxine (SYNTHROID, LEVOTHROID) 100 MCG tablet Take 1 tablet (100 mcg total) by mouth daily.  Marland Kitchen losartan (COZAAR) 100 MG tablet Take 1 tablet (100 mg total) by mouth daily.  . metoprolol (LOPRESSOR) 25 MG tablet Take 1 tablet (25 mg total) by mouth 2 (two) times daily.  . mometasone-formoterol (DULERA) 100-5 MCG/ACT AERO Inhale 2 puffs into the lungs 2 (two) times daily.  . ondansetron (ZOFRAN) 4 MG tablet Take 4 mg by mouth every 4 (four) hours as needed. For nausea and vomiting  . pantoprazole (PROTONIX) 40 MG tablet TAKE 1 TABLET BY MOUTH TWICE DAILY BEFORE A MEAL  . traMADol (ULTRAM) 50 MG tablet Take 1 tablet (50 mg total) by mouth every 6 (six) hours as needed.  Marland Kitchen VITAMIN D, ERGOCALCIFEROL, PO Take 1.25 mg by mouth every 30 (thirty) days.  . [DISCONTINUED] metoprolol (LOPRESSOR) 50 MG tablet Take 1 tablet (50 mg total) by mouth 2 (two) times daily.  Marland Kitchen gabapentin (NEURONTIN) 100 MG capsule Take 1 capsule (100 mg total) by mouth 3 (three) times daily.  Marland Kitchen HYDROcodone-acetaminophen (NORCO/VICODIN) 5-325 MG per tablet Take 1 tablet by mouth every 6 (six) hours as needed for pain.  Marland Kitchen loperamide (IMODIUM A-D) 2 MG tablet Take 2 mg by mouth 4 (four) times daily as needed.    Marland Kitchen omeprazole (PRILOSEC) 40 MG capsule Take 1 capsule (40 mg total) by mouth daily.  . pramipexole (MIRAPEX) 0.125 MG tablet Take 1 tablet (0.125 mg total) by mouth 3 (three) times daily.  . sertraline (ZOLOFT) 50 MG tablet Take 1 tablet (50 mg total) by mouth  daily.  . Tdap (BOOSTRIX) 5-2.5-18.5 LF-MCG/0.5 injection Inject 0.5 mLs into the muscle once.  . zoster vaccine live, PF, (ZOSTAVAX) 09811 UNT/0.65ML injection Inject 19,400 Units into the skin once.

## 2013-10-04 NOTE — Patient Instructions (Addendum)
  We have decreased your metoprolol dose to 25 mg twice daily.  You can cut your tablets in half for now ;  I will refill at a lower dose  If your fatigue doesn't improve, we can send you to the Cancer center for a hormone shot to build your blood back up.   You can try taking Lactase with any meal containing dairy,  Or switch to Almond or rice mild for your cereal milk.   We will substitute omeprazole or nexium until your protonix  gets approved.   We should recheck your thyroid level at the end of March along with fasting labs  (ok to take you medications but no food or drink other than water)   You need to have a TDaP vaccine and a Shingles vaccine.  I have given you prescriptions for thses because they will be cheaper at the health Dept or at your  local pharmacy because Medicare will not reimburse for them.   You received the pneumonia vaccine today.  We will contact you with the bloodwork results

## 2013-10-04 NOTE — Progress Notes (Signed)
Pre-visit discussion using our clinic review tool. No additional management support is needed unless otherwise documented below in the visit note.  

## 2013-10-06 DIAGNOSIS — E669 Obesity, unspecified: Secondary | ICD-10-CM | POA: Insufficient documentation

## 2013-10-06 DIAGNOSIS — E039 Hypothyroidism, unspecified: Secondary | ICD-10-CM | POA: Insufficient documentation

## 2013-10-06 NOTE — Assessment & Plan Note (Signed)
Complicated by daughter Pam's declining health, with insomnia,  Increased tearfulness and frequent medical evaluations noted. She has stopped the sertraline . Urged to use the alprazolam I have her for insomnia and prn once daily .

## 2013-10-06 NOTE — Assessment & Plan Note (Signed)
Dose of metoprolol was reduced today to 25 mg bid.

## 2013-10-06 NOTE — Assessment & Plan Note (Addendum)
EGD 09/13/13 noted Barrett's,  No dysplasia, H pylori or malignancy. Continue protonix bid dosing

## 2013-10-06 NOTE — Assessment & Plan Note (Signed)
hgb was 9.5 on Monday at Dr. Elwyn Lade office and discussions of referral to Cancer center for "transfusion " were started.  Discussed with patient that most likely iron or EPO transfusions would be indicated given her only symptom of fatigue, which may improve with reduction in metoprolol dose

## 2013-10-06 NOTE — Assessment & Plan Note (Signed)
She needs to continue twice daily dosing of protonix   PA needed for bid dosing  Has breakthrough symptoms with once daily dosin g

## 2013-10-06 NOTE — Assessment & Plan Note (Signed)
Thyroid function was under on 88 mcg in January and dose was incteased to 100 mcg daily.  Repeat TSH is due in late March

## 2013-10-06 NOTE — Assessment & Plan Note (Addendum)
She remains asymptomatic and lungs are clear today.  Wt gain of 6 lbs noted since last visit. But givne normal exam and decreased GFR no changes to furosemide dosing was done.  Metoprolol dose reducee to 25 mg bid today for bradycardia.

## 2013-10-06 NOTE — Assessment & Plan Note (Signed)
I have addressed  BMI and recommended a low glycemic index diet utilizing smaller more frequent meals to increase metabolism.  She is hindered from regular aerobic exercise by her multiple comorbidities

## 2013-10-21 ENCOUNTER — Encounter: Payer: Self-pay | Admitting: Internal Medicine

## 2013-10-28 ENCOUNTER — Other Ambulatory Visit (INDEPENDENT_AMBULATORY_CARE_PROVIDER_SITE_OTHER): Payer: Medicare Other

## 2013-10-28 DIAGNOSIS — E785 Hyperlipidemia, unspecified: Secondary | ICD-10-CM

## 2013-10-28 DIAGNOSIS — N189 Chronic kidney disease, unspecified: Secondary | ICD-10-CM

## 2013-10-28 DIAGNOSIS — N039 Chronic nephritic syndrome with unspecified morphologic changes: Secondary | ICD-10-CM

## 2013-10-28 DIAGNOSIS — D631 Anemia in chronic kidney disease: Secondary | ICD-10-CM

## 2013-10-28 DIAGNOSIS — D519 Vitamin B12 deficiency anemia, unspecified: Secondary | ICD-10-CM

## 2013-10-28 DIAGNOSIS — D518 Other vitamin B12 deficiency anemias: Secondary | ICD-10-CM

## 2013-10-28 DIAGNOSIS — N184 Chronic kidney disease, stage 4 (severe): Secondary | ICD-10-CM

## 2013-10-28 DIAGNOSIS — E039 Hypothyroidism, unspecified: Secondary | ICD-10-CM

## 2013-10-28 LAB — COMPREHENSIVE METABOLIC PANEL
ALT: 12 U/L (ref 0–35)
AST: 16 U/L (ref 0–37)
Albumin: 3.6 g/dL (ref 3.5–5.2)
Alkaline Phosphatase: 113 U/L (ref 39–117)
BILIRUBIN TOTAL: 0.9 mg/dL (ref 0.3–1.2)
BUN: 47 mg/dL — ABNORMAL HIGH (ref 6–23)
CHLORIDE: 106 meq/L (ref 96–112)
CO2: 25 mEq/L (ref 19–32)
Calcium: 9.3 mg/dL (ref 8.4–10.5)
Creatinine, Ser: 1.7 mg/dL — ABNORMAL HIGH (ref 0.4–1.2)
GFR: 30.75 mL/min — ABNORMAL LOW (ref >60.00)
Glucose, Bld: 89 mg/dL (ref 70–99)
Potassium: 4.3 mEq/L (ref 3.5–5.1)
SODIUM: 141 meq/L (ref 135–145)
TOTAL PROTEIN: 6.4 g/dL (ref 6.0–8.3)

## 2013-10-28 LAB — LIPID PANEL
CHOL/HDL RATIO: 4
Cholesterol: 116 mg/dL (ref 0–200)
HDL: 26.3 mg/dL — AB (ref >39.00)
LDL Cholesterol: 58 mg/dL (ref 0–99)
Triglycerides: 159 mg/dL — ABNORMAL HIGH (ref 0.0–149.0)
VLDL: 31.8 mg/dL (ref 0.0–40.0)

## 2013-10-28 LAB — CBC WITH DIFFERENTIAL/PLATELET
Basophils Absolute: 0 10*3/uL (ref 0.0–0.1)
Basophils Relative: 0.5 % (ref 0.0–3.0)
EOS PCT: 3.3 % (ref 0.0–5.0)
Eosinophils Absolute: 0.2 10*3/uL (ref 0.0–0.7)
HEMATOCRIT: 31.7 % — AB (ref 36.0–46.0)
Hemoglobin: 10.2 g/dL — ABNORMAL LOW (ref 12.0–15.0)
LYMPHS ABS: 1.5 10*3/uL (ref 0.7–4.0)
LYMPHS PCT: 22.2 % (ref 12.0–46.0)
MCHC: 32.2 g/dL (ref 30.0–36.0)
MCV: 86 fl (ref 78.0–100.0)
MONOS PCT: 8 % (ref 3.0–12.0)
Monocytes Absolute: 0.5 10*3/uL (ref 0.1–1.0)
Neutro Abs: 4.4 10*3/uL (ref 1.4–7.7)
Neutrophils Relative %: 66 % (ref 43.0–77.0)
PLATELETS: 240 10*3/uL (ref 150.0–400.0)
RBC: 3.69 Mil/uL — ABNORMAL LOW (ref 3.87–5.11)
RDW: 15.9 % — ABNORMAL HIGH (ref 11.5–14.6)
WBC: 6.7 10*3/uL (ref 4.5–10.5)

## 2013-10-28 LAB — TSH
TSH: 2.36 u[IU]/mL (ref 0.35–5.50)
TSH: 2.36 u[IU]/mL (ref 0.41–5.90)

## 2013-10-28 LAB — VITAMIN B12: Vitamin B-12: >1500 pg/mL — ABNORMAL HIGH (ref 211–911)

## 2013-10-28 LAB — IBC PANEL
IRON: 42 ug/dL (ref 42–145)
Saturation Ratios: 8.8 % — ABNORMAL LOW (ref 20.0–50.0)
Transferrin: 342.2 mg/dL (ref 212.0–360.0)

## 2013-10-28 NOTE — Addendum Note (Signed)
Addended by: Karlene Einstein D on: 10/28/2013 10:24 AM   Modules accepted: Orders

## 2013-10-29 ENCOUNTER — Encounter: Payer: Self-pay | Admitting: Internal Medicine

## 2013-10-29 ENCOUNTER — Other Ambulatory Visit: Payer: Self-pay | Admitting: Internal Medicine

## 2013-10-31 NOTE — Telephone Encounter (Signed)
Unread mychart message mailed  

## 2013-11-15 ENCOUNTER — Other Ambulatory Visit: Payer: Self-pay | Admitting: Internal Medicine

## 2013-12-12 ENCOUNTER — Other Ambulatory Visit: Payer: Self-pay | Admitting: Internal Medicine

## 2013-12-23 LAB — BASIC METABOLIC PANEL
BUN: 32 mg/dL — AB (ref 4–21)
Creatinine: 1.7 mg/dL — AB (ref 0.5–1.1)

## 2013-12-31 ENCOUNTER — Other Ambulatory Visit: Payer: Self-pay | Admitting: Internal Medicine

## 2014-01-01 ENCOUNTER — Encounter: Payer: Self-pay | Admitting: Internal Medicine

## 2014-01-01 ENCOUNTER — Ambulatory Visit (INDEPENDENT_AMBULATORY_CARE_PROVIDER_SITE_OTHER): Payer: Medicare Other | Admitting: Internal Medicine

## 2014-01-01 VITALS — BP 124/58 | HR 56 | Temp 97.8°F | Resp 16 | Ht 62.0 in | Wt 169.8 lb

## 2014-01-01 DIAGNOSIS — R21 Rash and other nonspecific skin eruption: Secondary | ICD-10-CM

## 2014-01-01 DIAGNOSIS — F411 Generalized anxiety disorder: Secondary | ICD-10-CM

## 2014-01-01 MED ORDER — PREDNISONE (PAK) 10 MG PO TABS: ORAL_TABLET | ORAL | Status: DC

## 2014-01-01 MED ORDER — HYDROXYZINE HCL 25 MG PO TABS: 25.0000 mg | ORAL_TABLET | Freq: Three times a day (TID) | ORAL | Status: DC | PRN

## 2014-01-01 MED ORDER — FEXOFENADINE HCL 180 MG PO TABS: 180.0000 mg | ORAL_TABLET | ORAL | Status: DC

## 2014-01-01 NOTE — Progress Notes (Signed)
Pre-visit discussion using our clinic review tool. No additional management support is needed unless otherwise documented below in the visit note.  

## 2014-01-01 NOTE — Progress Notes (Signed)
Patient ID: Savannah Wilson, female   DOB: 1929/05/25, 80 y.o.   MRN: [id number]   Patient Active Problem List   Diagnosis Date Noted  . Rash and nonspecific skin eruption 01/04/2014  . Obesity (BMI 30.0-34.9) 10/06/2013  . Unspecified hypothyroidism 10/06/2013  . Hiatal hernia with gastroesophageal reflux 09/24/2013  . Zenker's diverticulum 09/24/2013  . Anxiety state, unspecified 07/05/2013  . Abnormal bone radiograph 07/05/2013  . Need for prophylactic vaccination and inoculation against influenza 05/07/2013  . Allergic rhinitis 01/11/2013  . Dyspnea on exertion 01/11/2013  . Bradycardia, drug induced 08/26/2012  . Renal cysts, acquired, bilateral 08/26/2012  . Pleural effusion, bilateral 08/26/2012  . Anemia in chronic kidney disease (CKD) 06/24/2012  . Neuropathy 05/22/2012  . History of pancreatitis   . Hypertension, uncontrolled 09/15/2011  . Restless legs syndrome 09/15/2011  . Systolic CHF, chronic 0000000  . Asthma   . Depression   . Venous insufficiency   . Barrett esophagus   . CKD (chronic kidney disease), stage IV     Subjective:  CC:   Chief Complaint  Patient presents with  . Follow-up    Patient c/o itching and rash all over. Pulled weeds on 12/27/13 rash started  on 12/28/13    HPI:   Savannah Wilson is a 80 y.o. female who presents for   Diffuse pruritic rash since Saturday after working in the yard on Friday.  Rash involves back,  chest and arms,  but not the legs. Was wearing no gloves,  3/4 length sleeves and pants. No recent medication changes.  Persistent insomnia aggravated by anxiety .  Worried about her daughter Savannah Wilson.     Had the shingles vaccine two week ago at her local pharmacy.  Planning to get tetanus in a month   Past Medical History  Diagnosis Date  . Hypothyroid   . Hyperlipidemia   . Diverticulosis   . Peripheral neuralgia   . Hyperkalemia   . Hypertension   . Asthma   . Depression   . GERD (gastroesophageal reflux disease)   .  Venous insufficiency   . Barrett esophagus   . Chronic bronchitis   . Diverticular disease   . Renal insufficiency   . Gout   . Pancreatitis due to biliary obstruction March 2010    s/p ERCP sphincterotomy, cholecystectomy    Past Surgical History  Procedure Laterality Date  . Ventral hernia repair    . Cataract extraction      right  . Cholecystectomy  02/10  . Vesicovaginal fistula closure w/ tah    . Tonsillectomy    . Ventral hernia repair  2007  . Cataract extraction  2008  . Cholecystectomy  2010  . Carpal tunnel release  jan 2013    Savannah Wilson    . albuterol  2.5 mg Nebulization Once     The following portions of the patient's history were reviewed and updated as appropriate: Allergies, current medications, and problem list.    Review of Systems:   Patient denies headache, fevers, malaise, unintentional weight loss, skin rash, eye pain, sinus congestion and sinus pain, sore throat, dysphagia,  hemoptysis , cough, dyspnea, wheezing, chest pain, palpitations, orthopnea, edema, abdominal pain, nausea, melena, diarrhea, constipation, flank pain, dysuria, hematuria, urinary  Frequency, nocturia, numbness, tingling, seizures,  Focal weakness, Loss of consciousness,  Tremor, , depression,  and suicidal ideation.     History   Social History  . Marital Status: Widowed    Spouse Name: N/A  Number of Children: 2  . Years of Education: N/A   Occupational History  . 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 Smoking status: Never Smoker   . Smokeless tobacco: Never Used  . Alcohol Use: Yes     Comment: rare  . Drug Use: No  . Sexual Activity: Not on file   Other Topics Concern  . Not on file   Social History Narrative   ** Merged History Encounter **       Widowed. Has 2 children and lives alone.    Objective:  Filed Vitals:   01/01/14 1403  BP: 124/58  Pulse: 56  Temp: 97.8 F (36.6 C)  Resp: 16     General appearance: alert,  cooperative and appears stated age Ears: normal TM's and external ear canals both ears Throat: lips, mucosa, and tongue normal; teeth and gums normal Neck: no adenopathy, no carotid bruit, supple, symmetrical, trachea midline and thyroid not enlarged, symmetric, no tenderness/mass/nodules Back: symmetric, no curvature. ROM normal. No CVA tenderness. Lungs: clear to auscultation bilaterally Heart: regular rate and rhythm, S1, S2 normal, no murmur, click, rub or gallop Abdomen: soft, non-tender; bowel sounds normal; no masses,  no organomegaly Pulses: 2+ and symmetric Skin: patchy erythematous blancing macular rash on inner arms,  And back  Lymph nodes: Cervical, supraclavicular, and axillary nodes normal.  Assessment and Plan:  Rash and nonspecific skin eruption Allegra advised,  Add prednisone if no improvement in a 48 hours  Anxiety state, unspecified Discussed her use of alprazolam at bedtime to her initiate sleep   Updated Medication List Outpatient Encounter Prescriptions as of 01/01/2014  Medication Sig  . acetaminophen (TYLENOL) 650 MG CR tablet Take 650 mg by mouth every 8 (eight) hours as needed.  Marland Kitchen albuterol (PROVENTIL HFA;VENTOLIN HFA) 108 (90 BASE) MCG/ACT inhaler Inhale 2 puffs into the lungs every 6 (six) hours as needed for wheezing.  Marland Kitchen ALPRAZolam (XANAX) 0.5 MG tablet TAKE ONE TABLET BY MOUTH TWICE DAILY AS NEEDED  . Beclomethasone Dipropionate 80 MCG/ACT AERS Place 2 sprays into the nose daily.  . cyanocobalamin ([employer] VITAMIN B12) 2000 MCG tablet Take 1 tablet (2,000 mcg total) by mouth daily.  . DiphenhydrAMINE HCl (BENADRYL ALLERGY PO) Take 1 tablet by mouth at bedtime. Take by mouth as needed.  . furosemide (LASIX) 40 MG tablet Take 1 tablet (40 mg total) by mouth daily.  . hydrALAZINE (APRESOLINE) 25 MG tablet Take 1 tablet (25 mg total) by mouth 3 (three) times daily.  Marland Kitchen levothyroxine (SYNTHROID, LEVOTHROID) 100 MCG tablet TAKE ONE TABLET BY MOUTH EVERY DAY  .  loperamide (IMODIUM A-D) 2 MG tablet Take 2 mg by mouth 4 (four) times daily as needed.    Marland Kitchen losartan (COZAAR) 100 MG tablet Take 1 tablet (100 mg total) by mouth daily.  . mometasone-formoterol (DULERA) 100-5 MCG/ACT AERO Inhale 2 puffs into the lungs 2 (two) times daily.  Marland Kitchen omeprazole (PRILOSEC) 40 MG capsule TAKE ONE CAPSULE BY MOUTH 2 TIMES DAILY 30 MINUTES BEFORE BREAKFAST WORKS BEST.  . ondansetron (ZOFRAN) 4 MG tablet Take 4 mg by mouth every 4 (four) hours as needed. For nausea and vomiting  . pantoprazole (PROTONIX) 40 MG tablet TAKE 1 TABLET BY MOUTH TWICE DAILY BEFORE A MEAL  . VITAMIN D, ERGOCALCIFEROL, PO Take 1.25 mg by mouth every 30 (thirty) days.  . fexofenadine (ALLEGRA) 180 MG tablet Take 1 tablet (180 mg total) by mouth every morning.  . fluticasone (  FLONASE) 50 MCG/ACT nasal spray Place 2 sprays into the nose daily.  Marland Kitchen gabapentin (NEURONTIN) 100 MG capsule Take 1 capsule (100 mg total) by mouth 3 (three) times daily.  Marland Kitchen HYDROcodone-acetaminophen (NORCO/VICODIN) 5-325 MG per tablet Take 1 tablet by mouth every 6 (six) hours as needed for pain.  . hydrOXYzine (ATARAX/VISTARIL) 25 MG tablet Take 1 tablet (25 mg total) by mouth 3 (three) times daily as needed.  . Lactulose 20 GM/30ML SOLN Take 30 mLs (20 g total) by mouth daily. As needed for constipation , may repeat in 6 hours if needed  . metoprolol (LOPRESSOR) 25 MG tablet Take 1 tablet (25 mg total) by mouth 2 (two) times daily.  . pramipexole (MIRAPEX) 0.125 MG tablet Take 1 tablet (0.125 mg total) by mouth 3 (three) times daily.  . predniSONE (STERAPRED UNI-PAK) 10 MG tablet 6 tablets on Day 1 , then reduce by 1 tablet daily until gone  . sertraline (ZOLOFT) 50 MG tablet Take 1 tablet (50 mg total) by mouth daily.  . Tdap (BOOSTRIX) 5-2.5-18.5 LF-MCG/0.5 injection Inject 0.5 mLs into the muscle once.  . traMADol (ULTRAM) 50 MG tablet Take 1 tablet (50 mg total) by mouth every 6 (six) hours as needed.  . zoster vaccine live,  PF, (ZOSTAVAX) 16109 UNT/0.65ML injection Inject 19,400 Units into the skin once.  . [DISCONTINUED] fexofenadine (ALLEGRA) 180 MG tablet Take 180 mg by mouth every morning.     Orders Placed This Encounter  Procedures  . Basic metabolic panel    No Follow-up on file.

## 2014-01-01 NOTE — Patient Instructions (Addendum)
Start the fexofenadine immediately (allegra,  Once daily)_  You can add the hydroxyzine every 8 hours as needed  for itching if no relief in 2 hours. It may make you drowsy  If the rash spreads.,  Start the prednisone taper   You amy continue using the alprazolam at bedtime to help you rest IF YOU need it.

## 2014-01-04 DIAGNOSIS — R21 Rash and other nonspecific skin eruption: Secondary | ICD-10-CM | POA: Insufficient documentation

## 2014-01-04 NOTE — Assessment & Plan Note (Signed)
Allegra advised,  Add prednisone if no improvement in a 48 hours

## 2014-01-04 NOTE — Assessment & Plan Note (Signed)
Discussed her use of alprazolam at bedtime to her initiate sleep

## 2014-01-15 ENCOUNTER — Other Ambulatory Visit: Payer: Self-pay | Admitting: Internal Medicine

## 2014-01-15 DIAGNOSIS — F411 Generalized anxiety disorder: Secondary | ICD-10-CM

## 2014-01-15 NOTE — Telephone Encounter (Signed)
Rx faxed to pharmacy  

## 2014-01-15 NOTE — Telephone Encounter (Signed)
[facility] 10/04/13, ok refill?

## 2014-01-23 ENCOUNTER — Other Ambulatory Visit: Payer: Self-pay | Admitting: Internal Medicine

## 2014-02-24 ENCOUNTER — Other Ambulatory Visit: Payer: Self-pay | Admitting: Internal Medicine

## 2014-03-17 LAB — CBC AND DIFFERENTIAL
HCT: 33 % — AB (ref 36–46)
HEMOGLOBIN: 10.8 g/dL — AB (ref 12.0–16.0)
WBC: 7.6 10*3/mL

## 2014-03-17 LAB — BASIC METABOLIC PANEL
BUN: 37 mg/dL — AB (ref 4–21)
CREATININE: 1.8 mg/dL — AB (ref 0.5–1.1)
POTASSIUM: 4.8 mmol/L (ref 3.4–5.3)

## 2014-04-04 ENCOUNTER — Ambulatory Visit (INDEPENDENT_AMBULATORY_CARE_PROVIDER_SITE_OTHER): Payer: Medicare Other | Admitting: Internal Medicine

## 2014-04-04 ENCOUNTER — Encounter: Payer: Self-pay | Admitting: Internal Medicine

## 2014-04-04 VITALS — BP 142/70 | HR 59 | Temp 97.9°F | Resp 16 | Ht 62.0 in | Wt 167.5 lb

## 2014-04-04 DIAGNOSIS — I872 Venous insufficiency (chronic) (peripheral): Secondary | ICD-10-CM

## 2014-04-04 DIAGNOSIS — I10 Essential (primary) hypertension: Secondary | ICD-10-CM

## 2014-04-04 DIAGNOSIS — Z1239 Encounter for other screening for malignant neoplasm of breast: Secondary | ICD-10-CM

## 2014-04-04 DIAGNOSIS — I509 Heart failure, unspecified: Secondary | ICD-10-CM

## 2014-04-04 DIAGNOSIS — Z23 Encounter for immunization: Secondary | ICD-10-CM

## 2014-04-04 DIAGNOSIS — I5022 Chronic systolic (congestive) heart failure: Secondary | ICD-10-CM

## 2014-04-04 MED ORDER — TETANUS-DIPHTH-ACELL PERTUSSIS 5-2.5-18.5 LF-MCG/0.5 IM SUSP: 0.5000 mL | Freq: Once | INTRAMUSCULAR | Status: DC

## 2014-04-04 MED ORDER — ALPRAZOLAM 0.5 MG PO TABS: ORAL_TABLET | ORAL | Status: DC

## 2014-04-04 NOTE — Progress Notes (Signed)
Pre-visit discussion using our clinic review tool. No additional management support is needed unless otherwise documented below in the visit note.  

## 2014-04-04 NOTE — Patient Instructions (Addendum)
You are doing well!  You have lost 2 more lbs!!  Your goal to get your BMI < 30 is 162 lbs   I'll see you again in 3 months   I have increased your alprazolam rx to 45 /month so you won't run out.

## 2014-04-06 NOTE — Progress Notes (Signed)
Patient ID: Savannah Wilson, female   DOB: 20-Aug-1928, 80 y.o.   MRN: [id number]   Patient Active Problem List   Diagnosis Date Noted  . Rash and nonspecific skin eruption 01/04/2014  . Obesity (BMI 30.0-34.9) 10/06/2013  . Unspecified hypothyroidism 10/06/2013  . Hiatal hernia with gastroesophageal reflux 09/24/2013  . Zenker's diverticulum 09/24/2013  . Anxiety state, unspecified 07/05/2013  . Abnormal bone radiograph 07/05/2013  . Need for prophylactic vaccination and inoculation against influenza 05/07/2013  . Allergic rhinitis 01/11/2013  . Dyspnea on exertion 01/11/2013  . Bradycardia, drug induced 08/26/2012  . Renal cysts, acquired, bilateral 08/26/2012  . Pleural effusion, bilateral 08/26/2012  . Anemia in chronic kidney disease (CKD) 06/24/2012  . Neuropathy 05/22/2012  . History of pancreatitis   . Hypertension 09/15/2011  . Restless legs syndrome 09/15/2011  . Systolic CHF, chronic 0000000  . Asthma   . Depression   . Venous insufficiency   . Barrett esophagus   . CKD (chronic kidney disease), stage IV     Subjective:  CC:   Chief Complaint  Patient presents with  . Follow-up    General follow up  . Hypertension    HPI:   Savannah Wilson is a 80 y.o. female who presents for Follow up on multiple issues including hypertension, chronic LE edema , multifactorial,  CKD stage 3, and GAD.  She feels generally ell today,  Her edema is minimal,  She is currently getting adequate sleep, and denies pain, diarrhea and constipation . Tolerating her medications without side effects.[facility] every 3 months and GFR has been stable at < 30 ml/min   Past Medical History  Diagnosis Date  . Hypothyroid   . Hyperlipidemia   . Diverticulosis   . Peripheral neuralgia   . Hyperkalemia   . Hypertension   . Asthma   . Depression   . GERD (gastroesophageal reflux disease)   . Venous insufficiency   . Barrett esophagus   . Chronic bronchitis   . Diverticular  disease   . Renal insufficiency   . Gout   . Pancreatitis due to biliary obstruction March 2010    s/p ERCP sphincterotomy, cholecystectomy    Past Surgical History  Procedure Laterality Date  . Ventral hernia repair    . Cataract extraction      right  . Cholecystectomy  02/10  . Vesicovaginal fistula closure w/ tah    . Tonsillectomy    . Ventral hernia repair  2007  . Cataract extraction  2008  . Cholecystectomy  2010  . Carpal tunnel release  jan 2013    Savannah Wilson    . albuterol  2.5 mg Nebulization Once     The following portions of the patient's history were reviewed and updated as appropriate: Allergies, current medications, and problem list.    Review of Systems:   Patient denies headache, fevers, malaise, unintentional weight loss, skin rash, eye pain, sinus congestion and sinus pain, sore throat, dysphagia,  hemoptysis , cough, dyspnea, wheezing, chest pain, palpitations, orthopnea, edema, abdominal pain, nausea, melena, diarrhea, constipation, flank pain, dysuria, hematuria, urinary  Frequency, nocturia, numbness, tingling, seizures,  Focal weakness, Loss of consciousness,  Tremor, insomnia, depression, anxiety, and suicidal ideation.     History   Social History  . Marital Status: Widowed    Spouse Name: N/A    Number of Children: 2  . Years of Education: N/A   Occupational History  . 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 Smoking status: Never Smoker   . Smokeless tobacco: Never Used  . Alcohol Use: Yes     Comment: rare  . Drug Use: No  . Sexual Activity: Not on file   Other Topics Concern  . Not on file   Social History Narrative   ** Merged History Encounter **       Widowed. Has 2 children and lives alone.    Objective:  Filed Vitals:   04/04/14 1339  BP: 142/70  Pulse: 59  Temp: 97.9 F (36.6 C)  Resp: 16     General appearance: alert, cooperative and appears stated age Ears: normal TM's and external  ear canals both ears Throat: lips, mucosa, and tongue normal; teeth and gums normal Neck: no adenopathy, no carotid bruit, supple, symmetrical, trachea midline and thyroid not enlarged, symmetric, no tenderness/mass/nodules Back: symmetric, no curvature. ROM normal. No CVA tenderness. Lungs: clear to auscultation bilaterally Heart: regular rate and rhythm, S1, S2 normal, no murmur, click, rub or gallop Abdomen: soft, non-tender; bowel sounds normal; no masses,  no organomegaly Pulses: 2+ and symmetric Skin: Skin color, texture, turgor normal. No rashes or lesions Lymph nodes: Cervical, supraclavicular, and axillary nodes normal.  Assessment and Plan:  Venous insufficiency Managed with daily use of compression stockings and judicious use of diuretics for wt gain of > 2 lbs overnight given concurrent history of systolic dysfunction.   Systolic CHF, chronic She remains asymptomatic and lungs are clear today.    Hypertension Well controlled on current regimen of hydralazine, losartan and metoprolol. . Renal function stable, no changes today.   Updated Medication List Outpatient Encounter Prescriptions as of 04/04/2014  Medication Sig  . acetaminophen (TYLENOL) 650 MG CR tablet Take 650 mg by mouth every 8 (eight) hours as needed.  Marland Kitchen albuterol (PROVENTIL HFA;VENTOLIN HFA) 108 (90 BASE) MCG/ACT inhaler Inhale 2 puffs into the lungs every 6 (six) hours as needed for wheezing.  Marland Kitchen ALPRAZolam (XANAX) 0.5 MG tablet TAKE ONE TABLET BY MOUTH TWICE DAILY AS NEEDED  . Beclomethasone Dipropionate 80 MCG/ACT AERS Place 2 sprays into the nose daily.  . cyanocobalamin ([facility] VITAMIN B12) 2000 MCG tablet Take 1 tablet (2,000 mcg total) by mouth daily.  . DiphenhydrAMINE HCl (BENADRYL ALLERGY PO) Take 1 tablet by mouth at bedtime. Take by mouth as needed.  . fexofenadine (ALLEGRA) 180 MG tablet Take 1 tablet (180 mg total) by mouth every morning.  . fluticasone (FLONASE) 50 MCG/ACT nasal spray Place 2  sprays into the nose daily.  . furosemide (LASIX) 40 MG tablet Take 1 tablet (40 mg total) by mouth daily.  Marland Kitchen gabapentin (NEURONTIN) 100 MG capsule Take 1 capsule (100 mg total) by mouth 3 (three) times daily.  . hydrALAZINE (APRESOLINE) 25 MG tablet Take 1 tablet (25 mg total) by mouth 3 (three) times daily.  Marland Kitchen HYDROcodone-acetaminophen (NORCO/VICODIN) 5-325 MG per tablet Take 1 tablet by mouth every 6 (six) hours as needed for pain.  . hydrOXYzine (ATARAX/VISTARIL) 25 MG tablet Take 1 tablet (25 mg total) by mouth 3 (three) times daily as needed.  . Lactulose 20 GM/30ML SOLN Take 30 mLs (20 g total) by mouth daily. As needed for constipation , may repeat in 6 hours if needed  . levothyroxine (SYNTHROID, LEVOTHROID) 100 MCG tablet TAKE ONE TABLET BY MOUTH EVERY DAY  . loperamide (IMODIUM A-D) 2 MG tablet Take 2 mg by mouth 4 (four) times daily as needed.    Marland Kitchen losartan (COZAAR) 100  MG tablet Take 1 tablet (100 mg total) by mouth daily.  . metoprolol (LOPRESSOR) 25 MG tablet Take 1 tablet (25 mg total) by mouth 2 (two) times daily.  . mometasone-formoterol (DULERA) 100-5 MCG/ACT AERO Inhale 2 puffs into the lungs 2 (two) times daily.  Marland Kitchen omeprazole (PRILOSEC) 40 MG capsule TAKE ONE CAPSULE BY MOUTH TWICE A DAY. 30 MINUTES BEFORE MEALS  . ondansetron (ZOFRAN) 4 MG tablet Take 4 mg by mouth every 4 (four) hours as needed. For nausea and vomiting  . pantoprazole (PROTONIX) 40 MG tablet TAKE 1 TABLET BY MOUTH TWICE DAILY BEFORE A MEAL  . pramipexole (MIRAPEX) 0.125 MG tablet Take 1 tablet (0.125 mg total) by mouth 3 (three) times daily.  . predniSONE (STERAPRED UNI-PAK) 10 MG tablet 6 tablets on Day 1 , then reduce by 1 tablet daily until gone  . sertraline (ZOLOFT) 50 MG tablet Take 1 tablet (50 mg total) by mouth daily.  . traMADol (ULTRAM) 50 MG tablet Take 1 tablet (50 mg total) by mouth every 6 (six) hours as needed.  Marland Kitchen VITAMIN D, ERGOCALCIFEROL, PO Take 1.25 mg by mouth every 30 (thirty) days.  .  [DISCONTINUED] ALPRAZolam (XANAX) 0.5 MG tablet TAKE ONE TABLET BY MOUTH TWICE DAILY AS NEEDED  . [DISCONTINUED] zoster vaccine live, PF, (ZOSTAVAX) 16109 UNT/0.65ML injection Inject 19,400 Units into the skin once.  . Tdap (BOOSTRIX) 5-2.5-18.5 LF-MCG/0.5 injection Inject 0.5 mLs into the muscle once.  . Tdap (BOOSTRIX) 5-2.5-18.5 LF-MCG/0.5 injection Inject 0.5 mLs into the muscle once.     Orders Placed This Encounter  Procedures  . MM DIGITAL SCREENING BILATERAL    Return in about 3 months (around 07/05/2014).

## 2014-04-06 NOTE — Assessment & Plan Note (Signed)
Managed with daily use of compression stockings and judicious use of diuretics for wt gain of > 2 lbs overnight given concurrent history of systolic dysfunction.

## 2014-04-06 NOTE — Assessment & Plan Note (Signed)
She remains asymptomatic and lungs are clear today.

## 2014-04-06 NOTE — Assessment & Plan Note (Signed)
Well controlled on current regimen of hydralazine, losartan and metoprolol. . Renal function stable, no changes today.

## 2014-04-17 ENCOUNTER — Encounter: Payer: Self-pay | Admitting: Nurse Practitioner

## 2014-04-17 NOTE — Progress Notes (Signed)
This encounter was created in error - please disregard.

## 2014-06-09 LAB — BASIC METABOLIC PANEL
BUN: 29 mg/dL — AB (ref 4–21)
Creatinine: 1.6 mg/dL — AB (ref 0.5–1.1)
Potassium: 4.4 mmol/L (ref 3.4–5.3)

## 2014-06-09 LAB — CBC AND DIFFERENTIAL
Hemoglobin: 10.4 g/dL — AB (ref 12.0–16.0)
WBC: 8.4 10^3/mL

## 2014-06-18 ENCOUNTER — Telehealth: Payer: Self-pay | Admitting: Internal Medicine

## 2014-06-18 MED ORDER — METOPROLOL TARTRATE 25 MG PO TABS: 25.0000 mg | ORAL_TABLET | Freq: Two times a day (BID) | ORAL | Status: DC

## 2014-06-18 NOTE — Telephone Encounter (Signed)
Rx sent to pharmacy by escript  

## 2014-06-18 NOTE — Telephone Encounter (Signed)
Ms. Nuding called saying she needs a refill of Metoprolol. She has one pill left. Please send a refill to [facility]. Please call the pt if you have questions. Pt ph# [phone] Thank you.

## 2014-06-23 ENCOUNTER — Other Ambulatory Visit: Payer: Self-pay | Admitting: Internal Medicine

## 2014-06-24 ENCOUNTER — Encounter: Payer: Self-pay | Admitting: Internal Medicine

## 2014-06-24 ENCOUNTER — Ambulatory Visit (INDEPENDENT_AMBULATORY_CARE_PROVIDER_SITE_OTHER): Payer: Medicare Other | Admitting: Internal Medicine

## 2014-06-24 VITALS — BP 128/62 | HR 56 | Temp 97.5°F | Wt 165.0 lb

## 2014-06-24 DIAGNOSIS — L299 Pruritus, unspecified: Secondary | ICD-10-CM

## 2014-06-24 MED ORDER — METHYLPREDNISOLONE ACETATE 80 MG/ML IJ SUSP
80.0000 mg | Freq: Once | INTRAMUSCULAR | Status: AC
  Administered 2014-06-24: 80 mg via INTRAMUSCULAR

## 2014-06-24 NOTE — Progress Notes (Signed)
Subjective:    Patient ID: Savannah Wilson, female    DOB: 1929-08-03, 80 y.o.   MRN: [id number]  HPI  Pt presents to the clinic today with c/o rash on her chest, arms and back. She reports that she noticed this 3-4 days ago. She has not come in contact with anything she knows she is allergic to. She has not changed her soap, lotions or detergents. She reports that she did recently make some [place] tea, but reports that she has had this in the past. She has not started any new medications. She has tried Benadryl OTC with some relief.  Review of Systems      Past Medical History  Diagnosis Date  . Hypothyroid   . Hyperlipidemia   . Diverticulosis   . Peripheral neuralgia   . Hyperkalemia   . Hypertension   . Asthma   . Depression   . GERD (gastroesophageal reflux disease)   . Venous insufficiency   . Barrett esophagus   . Chronic bronchitis   . Diverticular disease   . Renal insufficiency   . Gout   . Pancreatitis due to biliary obstruction March 2010    s/p ERCP sphincterotomy, cholecystectomy    Current Outpatient Prescriptions  Medication Sig Dispense Refill  . acetaminophen (TYLENOL) 650 MG CR tablet Take 650 mg by mouth every 8 (eight) hours as needed.    Marland Kitchen albuterol (PROVENTIL HFA;VENTOLIN HFA) 108 (90 BASE) MCG/ACT inhaler Inhale 2 puffs into the lungs every 6 (six) hours as needed for wheezing. 1 Inhaler 11  . ALPRAZolam (XANAX) 0.5 MG tablet TAKE ONE TABLET BY MOUTH TWICE DAILY AS NEEDED 45 tablet 5  . Beclomethasone Dipropionate 80 MCG/ACT AERS Place 2 sprays into the nose daily. 8.7 g 11  . cyanocobalamin ([facility] VITAMIN B12) 2000 MCG tablet Take 1 tablet (2,000 mcg total) by mouth daily. 30 tablet 1  . DiphenhydrAMINE HCl (BENADRYL ALLERGY PO) Take 1 tablet by mouth at bedtime. Take by mouth as needed.    . fexofenadine (ALLEGRA) 180 MG tablet Take 1 tablet (180 mg total) by mouth every morning. 30 tablet 2  . fluticasone (FLONASE) 50 MCG/ACT nasal spray Place 2  sprays into the nose daily. 16 g 6  . furosemide (LASIX) 40 MG tablet TAKE ONE TABLET BY MOUTH EVERY DAY 30 tablet 6  . gabapentin (NEURONTIN) 100 MG capsule Take 1 capsule (100 mg total) by mouth 3 (three) times daily. 90 capsule 3  . hydrALAZINE (APRESOLINE) 25 MG tablet Take 1 tablet (25 mg total) by mouth 3 (three) times daily. 30 tablet 3  . HYDROcodone-acetaminophen (NORCO/VICODIN) 5-325 MG per tablet Take 1 tablet by mouth every 6 (six) hours as needed for pain. 120 tablet 4  . hydrOXYzine (ATARAX/VISTARIL) 25 MG tablet Take 1 tablet (25 mg total) by mouth 3 (three) times daily as needed. 30 tablet 0  . Lactulose 20 GM/30ML SOLN Take 30 mLs (20 g total) by mouth daily. As needed for constipation , may repeat in 6 hours if needed 240 mL 0  . levothyroxine (SYNTHROID, LEVOTHROID) 100 MCG tablet TAKE ONE TABLET BY MOUTH EVERY DAY 90 tablet 3  . loperamide (IMODIUM A-D) 2 MG tablet Take 2 mg by mouth 4 (four) times daily as needed.      Marland Kitchen losartan (COZAAR) 100 MG tablet Take 1 tablet (100 mg total) by mouth daily. 30 tablet 3  . metoprolol tartrate (LOPRESSOR) 25 MG tablet Take 1 tablet (25 mg total) by mouth 2 (  two) times daily. 60 tablet 5  . mometasone-formoterol (DULERA) 100-5 MCG/ACT AERO Inhale 2 puffs into the lungs 2 (two) times daily. 13 g 11  . omeprazole (PRILOSEC) 40 MG capsule TAKE ONE CAPSULE BY MOUTH TWICE A DAY. 30 MINUTES BEFORE MEALS 60 capsule 3  . ondansetron (ZOFRAN) 4 MG tablet Take 4 mg by mouth every 4 (four) hours as needed. For nausea and vomiting    . pantoprazole (PROTONIX) 40 MG tablet TAKE 1 TABLET BY MOUTH TWICE DAILY BEFORE A MEAL 60 tablet 5  . pramipexole (MIRAPEX) 0.125 MG tablet Take 1 tablet (0.125 mg total) by mouth 3 (three) times daily. 30 tablet 11  . predniSONE (STERAPRED UNI-PAK) 10 MG tablet 6 tablets on Day 1 , then reduce by 1 tablet daily until gone 21 tablet 0  . sertraline (ZOLOFT) 50 MG tablet Take 1 tablet (50 mg total) by mouth daily. 90 tablet  3  . Tdap (BOOSTRIX) 5-2.5-18.5 LF-MCG/0.5 injection Inject 0.5 mLs into the muscle once. 0.5 mL 0  . Tdap (BOOSTRIX) 5-2.5-18.5 LF-MCG/0.5 injection Inject 0.5 mLs into the muscle once. 0.5 mL 0  . traMADol (ULTRAM) 50 MG tablet Take 1 tablet (50 mg total) by mouth every 6 (six) hours as needed. 120 tablet 2  . VITAMIN D, ERGOCALCIFEROL, PO Take 1.25 mg by mouth every 30 (thirty) days.     Current Facility-Administered Medications  Medication Dose Route Frequency Provider Last Rate Last Dose  . albuterol (PROVENTIL) (2.5 MG/3ML) 0.083% nebulizer solution 2.5 mg  2.5 mg Nebulization Once [name], MD        Allergies  Allergen Reactions  . Ace Inhibitors Cough  . Benicar [Olmesartan Medoxomil]   . Clarithromycin Other (See Comments)    Blisters in mouth  . Clarithromycin Other (See Comments)    Unknown   . Norvasc [Amlodipine Besylate]   . Levofloxacin Rash  . Zithromax [Azithromycin Dihydrate] Rash    Family History  Problem Relation Age of Onset  . Kidney failure Mother   . Hypertension Mother   . Multiple myeloma Daughter   . Cancer Daughter     multiple myeloma  . Heart disease Father   . Rheumatologic disease Father   . Kidney cancer Father     History   Social History  . Marital Status: Widowed    Spouse Name: N/A    Number of Children: 2  . Years of Education: N/A   Occupational History  . 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 Smoking status: Never Smoker   . Smokeless tobacco: Never Used  . Alcohol Use: Yes     Comment: rare  . Drug Use: No  . Sexual Activity: Not on file   Other Topics Concern  . Not on file   Social History Narrative   ** Merged History Encounter **       Widowed. Has 2 children and lives alone.     Constitutional: Denies fever, malaise, fatigue, headache or abrupt weight changes.  Skin: Pt reports rash. Denies redness, lesions or ulcercations.    No other specific complaints in a complete review  of systems (except as listed in HPI above).  Objective:   Physical Exam   BP 128/62 mmHg  Pulse 56  Temp(Src) 97.5 F (36.4 C) (Oral)  Wt 165 lb (74.844 kg)  SpO2 98% Wt Readings from Last 3 Encounters:  06/24/14 165 lb (74.844 kg)  04/04/14 167 lb 8 oz (75.978  kg)  01/01/14 169 lb 12 oz (76.998 kg)    General: Appears her stated age, well developed, well nourished in NAD. Skin: Warm, dry and intact. I see no rash but do see excoriation marks from where she is scratching. Cardiovascular: Normal rate and rhythm. S1,S2 noted. ? S3. No murmur, rubs or gallops noted.  Pulmonary/Chest: Normal effort and positive vesicular breath sounds. No respiratory distress. No wheezes, rales or ronchi noted.   BMET    Component Value Date/Time   NA 141 10/28/2013 0907   K 4.3 10/28/2013 0907   CL 106 10/28/2013 0907   CO2 25 10/28/2013 0907   GLUCOSE 89 10/28/2013 0907   BUN 32* 12/23/2013   BUN 47* 10/28/2013 0907   CREATININE 1.7* 12/23/2013   CREATININE 1.7* 10/28/2013 0907   CREATININE 2.69* 03/16/2012 1516   CALCIUM 9.3 10/28/2013 0907   GFRNONAA 16* 03/16/2012 1516   GFRAA 19* 03/16/2012 1516    Lipid Panel     Component Value Date/Time   CHOL 116 10/28/2013 0907   TRIG 159.0* 10/28/2013 0907   HDL 26.30* 10/28/2013 0907   CHOLHDL 4 10/28/2013 0907   VLDL 31.8 10/28/2013 0907   LDLCALC 58 10/28/2013 0907    CBC    Component Value Date/Time   WBC 6.7 10/28/2013 0907   RBC 3.69* 10/28/2013 0907   HGB 10.2* 10/28/2013 0907   HCT 31.7* 10/28/2013 0907   PLT 240.0 10/28/2013 0907   MCV 86.0 10/28/2013 0907   MCH 29.9 03/16/2012 1516   MCHC 32.2 10/28/2013 0907   RDW 15.9* 10/28/2013 0907   LYMPHSABS 1.5 10/28/2013 0907   MONOABS 0.5 10/28/2013 0907   EOSABS 0.2 10/28/2013 0907   BASOSABS 0.0 10/28/2013 0907    Hgb A1C No results found for: HGBA1C      Assessment & Plan:   Pruritus:  I do not see a rash 80 mg Depo IM today Advised her she has hydroxyzine  at home to take if she continues to itch- she will call me if she can not find this and I will call her in a new RX  RTC as needed or if symptoms persist or worsen

## 2014-06-24 NOTE — Addendum Note (Signed)
Addended by: Lurlean Nanny on: 06/24/2014 01:55 PM   Modules accepted: Orders

## 2014-06-24 NOTE — Progress Notes (Signed)
Pre visit review using our clinic review tool, if applicable. No additional management support is needed unless otherwise documented below in the visit note. 

## 2014-06-24 NOTE — Patient Instructions (Signed)

## 2014-06-25 ENCOUNTER — Telehealth: Payer: Self-pay

## 2014-06-25 ENCOUNTER — Other Ambulatory Visit: Payer: Self-pay | Admitting: Internal Medicine

## 2014-06-25 MED ORDER — HYDROXYZINE HCL 25 MG PO TABS: 25.0000 mg | ORAL_TABLET | Freq: Three times a day (TID) | ORAL | Status: DC | PRN

## 2014-06-25 NOTE — Telephone Encounter (Signed)
hydroxyzine sent to [facility]

## 2014-06-25 NOTE — Telephone Encounter (Signed)
Pt left v/m; pt seen 06/24/14 and pt does need rx of hydroxyzine sent to [facility].

## 2014-06-26 ENCOUNTER — Other Ambulatory Visit: Payer: Self-pay | Admitting: Internal Medicine

## 2014-07-11 ENCOUNTER — Encounter: Payer: Medicare Other | Admitting: Internal Medicine

## 2014-08-06 ENCOUNTER — Encounter: Payer: Self-pay | Admitting: Internal Medicine

## 2014-08-06 ENCOUNTER — Ambulatory Visit (INDEPENDENT_AMBULATORY_CARE_PROVIDER_SITE_OTHER): Payer: Medicare Other | Admitting: Internal Medicine

## 2014-08-06 VITALS — BP 130/68 | HR 53 | Temp 98.3°F | Resp 14 | Ht 59.5 in | Wt 165.5 lb

## 2014-08-06 DIAGNOSIS — G629 Polyneuropathy, unspecified: Secondary | ICD-10-CM

## 2014-08-06 DIAGNOSIS — L989 Disorder of the skin and subcutaneous tissue, unspecified: Secondary | ICD-10-CM

## 2014-08-06 DIAGNOSIS — E038 Other specified hypothyroidism: Secondary | ICD-10-CM

## 2014-08-06 DIAGNOSIS — E034 Atrophy of thyroid (acquired): Secondary | ICD-10-CM

## 2014-08-06 DIAGNOSIS — I10 Essential (primary) hypertension: Secondary | ICD-10-CM

## 2014-08-06 DIAGNOSIS — K4091 Unilateral inguinal hernia, without obstruction or gangrene, recurrent: Secondary | ICD-10-CM

## 2014-08-06 DIAGNOSIS — K219 Gastro-esophageal reflux disease without esophagitis: Secondary | ICD-10-CM

## 2014-08-06 DIAGNOSIS — J4520 Mild intermittent asthma, uncomplicated: Secondary | ICD-10-CM

## 2014-08-06 DIAGNOSIS — F411 Generalized anxiety disorder: Secondary | ICD-10-CM

## 2014-08-06 DIAGNOSIS — Z0000 Encounter for general adult medical examination without abnormal findings: Secondary | ICD-10-CM

## 2014-08-06 DIAGNOSIS — I5022 Chronic systolic (congestive) heart failure: Secondary | ICD-10-CM

## 2014-08-06 DIAGNOSIS — K449 Diaphragmatic hernia without obstruction or gangrene: Secondary | ICD-10-CM

## 2014-08-06 DIAGNOSIS — E669 Obesity, unspecified: Secondary | ICD-10-CM

## 2014-08-06 DIAGNOSIS — H6123 Impacted cerumen, bilateral: Secondary | ICD-10-CM

## 2014-08-06 NOTE — Progress Notes (Signed)
Patient ID: Savannah Wilson, female   DOB: 1928-09-10, 80 y.o.   MRN: [id number]  The patient is here for annual Medicare wellness examination and management of other chronic and acute problems.  She has been having persistent dull pain in the RLQ for the last several weeks.  She has a history of a right inguinal hernia repair years ago by Dr Tamala Julian, and thinks it may have returned. She describes  a dull ache, without severe pain or discoloration of skin.      The risk factors are reflected in the social history.  The roster of all physicians providing medical care to patient - is listed in the Snapshot section of the chart.  Activities of daily living:  The patient is 100% independent in all ADLs: dressing, toileting, feeding as well as independent mobility  Home safety : The patient has smoke detectors in the home. They wear seatbelts.  There are no firearms at home. There is no violence in the home.   There is no risks for hepatitis, STDs or HIV. There is no   history of blood transfusion. They have no travel history to infectious disease endemic areas of the world.  The patient has seen their dentist in the last six month. They have seen their eye doctor in the last year. They admit to slight hearing difficulty with regard to whispered voices and some television programs.  They have deferred audiologic testing in the last year.  They do not  have excessive sun exposure. Discussed the need for sun protection: hats, long sleeves and use of sunscreen if there is significant sun exposure.   Diet: the importance of a healthy diet is discussed. They do have a healthy diet.  The benefits of regular aerobic exercise were discussed. She walks 4 times per week ,  20 minutes.   Depression screen: there are no signs or vegative symptoms of depression- irritability, change in appetite, anhedonia, sadness/tearfullness.  Cognitive assessment: the patient manages all their financial and personal affairs and  is actively engaged. They could relate day,date,year and events; recalled 2/3 objects at 3 minutes; performed clock-face test normally.  The following portions of the patient's history were reviewed and updated as appropriate: allergies, current medications, past family history, past medical history,  past surgical history, past social history  and problem list.  Visual acuity was not assessed per patient preference since she has regular follow up with her ophthalmologist. Hearing and body mass index were assessed and reviewed.   During the course of the visit the patient was educated and counseled about appropriate screening and preventive services including : fall prevention , diabetes screening, nutrition counseling, colorectal cancer screening, and recommended immunizations.    Objective:  BP 130/68 mmHg  Pulse 53  Temp(Src) 98.3 F (36.8 C) (Oral)  Resp 14  Ht 4' 11.5" (1.511 m)  Wt 165 lb 8 oz (75.07 kg)  BMI 32.88 kg/m2  SpO2 96%   General appearance: alert, cooperative and appears stated age Head: Normocephalic, without obvious abnormality, atraumatic Eyes: conjunctivae/corneas clear. PERRL, EOM's intact. Fundi benign. Ears: normal TM's and external ear canals both ears Nose: Nares normal. Septum midline. Mucosa normal. No drainage or sinus tenderness. Throat: lips, mucosa, and tongue normal; teeth and gums normal Neck: no adenopathy, no carotid bruit, no JVD, supple, symmetrical, trachea midline and thyroid not enlarged, symmetric, no tenderness/mass/nodules Lungs: clear to auscultation bilaterally Breasts: normal appearance, no masses or tenderness Heart: regular rate and rhythm, S1, S2 normal, no  murmur, click, rub or gallop Abdomen: soft, non-tender; bowel sounds normal; mild bulge  Reducible RLQ,   no organomegaly Extremities: extremities normal, atraumatic, no cyanosis or edema Pulses: 2+ and symmetric Skin: Skin color, texture, turgor normal. No rashes or  lesions Neurologic: Alert and oriented X 3, normal strength and tone. Normal symmetric reflexes. Normal coordination and gait.   Assessment and Plan:  Problem List Items Addressed This Visit    Anxiety state    Chronic, secondary to concern about her daughter Savannah Wilson, who has MM and ESKD.  The risks and benefits of benzodiazepine use were reviewed with patient today including excessive sedation leading to respiratory depression,  impaired thinking/driving, and addiction.  Patient was advised to avoid concurrent use with alcohol, to use medication only as needed and not to share with others  .     Asthma    She has not had an exacerbation in over a year.     Cerumen impaction    Ears were irrigated today with warm tap water and impaction was resolved.     Hiatal hernia with gastroesophageal reflux    She will continue lifelong acid suppression with twice daily protonix,  medication  reviewed indicating of duplicate therapy with omeprazole.      Hypertension    BP has improved since Nephrology increased losartan to 100 mg daily earlier in the month..  No changes today    Hypothyroidism    Thyroid function was  WNL on current dose in march and neeeds repeating.  No current changes needed.   Lab Results  Component Value Date   TSH 2.36 10/28/2013       Medicare annual wellness visit, subsequent    Annual Medicare wellness  exam was done as well as a comprehensive physical exam and management of acute and chronic conditions .  During the course of the visit the patient was educated and counseled about appropriate screening and preventive services including : fall prevention , diabetes screening, nutrition counseling, colorectal cancer screening, and recommended immunizations.  Printed recommendations for health maintenance screenings was given.     Neuropathy    Idiopathic,  Did not tolerate gabapentin due to LE edema and dizziness.  Symptoms are mild and tolerable currently    Obesity  (BMI 30.0-34.9)    I have congratulated her in reduction of   BMI and encouraged  Continued weight loss with goal of 10% of body weigh over the next 6 months using a low glycemic index diet and regular exercise a minimum of 5 days per week.      Systolic CHF, chronic    She is asymptomatic currently and has no signs of failure on exam      Other Visit Diagnoses    Recurrent inguinal hernia without obstruction or gangrene, unspecified laterality    -  Primary    Relevant Orders       Ambulatory referral to General Surgery    Benign skin lesion of nose        Relevant Orders       Ambulatory referral to Dermatology

## 2014-08-06 NOTE — Patient Instructions (Addendum)
Referrals to dermatology and Dr Rochel Brome are in process  You are losing weight!  Good job,  Remember to eat smaller more frequent meals,  Every 3 hours ,  And cut back on  Your comfort foods (toast,  Biscuits, cookies, and grits)  Y.Health Maintenance Adopting a healthy lifestyle and getting preventive care can go a long way to promote health and wellness. Talk with your health care provider about what schedule of regular examinations is right for you. This is a good chance for you to check in with your provider about disease prevention and staying healthy. In between checkups, there are plenty of things you can do on your own. Experts have done a lot of research about which lifestyle changes and preventive measures are most likely to keep you healthy. Ask your health care provider for more information. WEIGHT AND DIET  Eat a healthy diet  Be sure to include plenty of vegetables, fruits, low-fat dairy products, and lean protein.  Do not eat a lot of foods high in solid fats, added sugars, or salt.  Get regular exercise. This is one of the most important things you can do for your health.  Most adults should exercise for at least 150 minutes each week. The exercise should increase your heart rate and make you sweat (moderate-intensity exercise).  Most adults should also do strengthening exercises at least twice a week. This is in addition to the moderate-intensity exercise.  Maintain a healthy weight  Body mass index (BMI) is a measurement that can be used to identify possible weight problems. It estimates body fat based on height and weight. Your health care provider can help determine your BMI and help you achieve or maintain a healthy weight.  For females 51 years of age and older:   A BMI below 18.5 is considered underweight.  A BMI of 18.5 to 24.9 is normal.  A BMI of 25 to 29.9 is considered overweight.  A BMI of 30 and above is considered obese.  Watch levels of  cholesterol and blood lipids  You should start having your blood tested for lipids and cholesterol at 78 years of age, then have this test every 5 years.  You may need to have your cholesterol levels checked more often if:  Your lipid or cholesterol levels are high.  You are older than 78 years of age.  You are at high risk for heart disease.  CANCER SCREENING   Lung Cancer  Lung cancer screening is recommended for adults 37-85 years old who are at high risk for lung cancer because of a history of smoking.  A yearly low-dose CT scan of the lungs is recommended for people who:  Currently smoke.  Have quit within the past 15 years.  Have at least a 30-pack-year history of smoking. A pack year is smoking an average of one pack of cigarettes a day for 1 year.  Yearly screening should continue until it has been 15 years since you quit.  Yearly screening should stop if you develop a health problem that would prevent you from having lung cancer treatment.  Breast Cancer  Practice breast self-awareness. This means understanding how your breasts normally appear and feel.  It also means doing regular breast self-exams. Let your health care provider know about any changes, no matter how small.  If you are in your 20s or 30s, you should have a clinical breast exam (CBE) by a health care provider every 1-3 years as part of a  regular health exam.  If you are 40 or older, have a CBE every year. Also consider having a breast X-ray (mammogram) every year.  If you have a family history of breast cancer, talk to your health care provider about genetic screening.  If you are at high risk for breast cancer, talk to your health care provider about having an MRI and a mammogram every year.  Breast cancer gene (BRCA) assessment is recommended for women who have family members with BRCA-related cancers. BRCA-related cancers include:  Breast.  Ovarian.  Tubal.  Peritoneal  cancers.  Results of the assessment will determine the need for genetic counseling and BRCA1 and BRCA2 testing. Cervical Cancer Routine pelvic examinations to screen for cervical cancer are no longer recommended for nonpregnant women who are considered low risk for cancer of the pelvic organs (ovaries, uterus, and vagina) and who do not have symptoms. A pelvic examination may be necessary if you have symptoms including those associated with pelvic infections. Ask your health care provider if a screening pelvic exam is right for you.   The Pap test is the screening test for cervical cancer for women who are considered at risk.  If you had a hysterectomy for a problem that was not cancer or a condition that could lead to cancer, then you no longer need Pap tests.  If you are older than 65 years, and you have had normal Pap tests for the past 10 years, you no longer need to have Pap tests.  If you have had past treatment for cervical cancer or a condition that could lead to cancer, you need Pap tests and screening for cancer for at least 20 years after your treatment.  If you no longer get a Pap test, assess your risk factors if they change (such as having a new sexual partner). This can affect whether you should start being screened again.  Some women have medical problems that increase their chance of getting cervical cancer. If this is the case for you, your health care provider may recommend more frequent screening and Pap tests.  The human papillomavirus (HPV) test is another test that may be used for cervical cancer screening. The HPV test looks for the virus that can cause cell changes in the cervix. The cells collected during the Pap test can be tested for HPV.  The HPV test can be used to screen women 49 years of age and older. Getting tested for HPV can extend the interval between normal Pap tests from three to five years.  An HPV test also should be used to screen women of any age who  have unclear Pap test results.  After 78 years of age, women should have HPV testing as often as Pap tests.  Colorectal Cancer  This type of cancer can be detected and often prevented.  Routine colorectal cancer screening usually begins at 78 years of age and continues through 78 years of age.  Your health care provider may recommend screening at an earlier age if you have risk factors for colon cancer.  Your health care provider may also recommend using home test kits to check for hidden blood in the stool.  A small camera at the end of a tube can be used to examine your colon directly (sigmoidoscopy or colonoscopy). This is done to check for the earliest forms of colorectal cancer.  Routine screening usually begins at age 66.  Direct examination of the colon should be repeated every 5-10 years through 78  years of age. However, you may need to be screened more often if early forms of precancerous polyps or small growths are found. Skin Cancer  Check your skin from head to toe regularly.  Tell your health care provider about any new moles or changes in moles, especially if there is a change in a mole's shape or color.  Also tell your health care provider if you have a mole that is larger than the size of a pencil eraser.  Always use sunscreen. Apply sunscreen liberally and repeatedly throughout the day.  Protect yourself by wearing long sleeves, pants, a wide-brimmed hat, and sunglasses whenever you are outside. HEART DISEASE, DIABETES, AND HIGH BLOOD PRESSURE   Have your blood pressure checked at least every 1-2 years. High blood pressure causes heart disease and increases the risk of stroke.  If you are between 45 years and 65 years old, ask your health care provider if you should take aspirin to prevent strokes.  Have regular diabetes screenings. This involves taking a blood sample to check your fasting blood sugar level.  If you are at a normal weight and have a low risk for  diabetes, have this test once every three years after 78 years of age.  If you are overweight and have a high risk for diabetes, consider being tested at a younger age or more often. PREVENTING INFECTION  Hepatitis B  If you have a higher risk for hepatitis B, you should be screened for this virus. You are considered at high risk for hepatitis B if:  You were born in a country where hepatitis B is common. Ask your health care provider which countries are considered high risk.  Your parents were born in a high-risk country, and you have not been immunized against hepatitis B (hepatitis B vaccine).  You have HIV or AIDS.  You use needles to inject street drugs.  You live with someone who has hepatitis B.  You have had sex with someone who has hepatitis B.  You get hemodialysis treatment.  You take certain medicines for conditions, including cancer, organ transplantation, and autoimmune conditions. Hepatitis C  Blood testing is recommended for:  Everyone born from [birth year] through 1963.  Anyone with known risk factors for hepatitis C. Sexually transmitted infections (STIs)  You should be screened for sexually transmitted infections (STIs) including gonorrhea and chlamydia if:  You are sexually active and are younger than 78 years of age.  You are older than 78 years of age and your health care provider tells you that you are at risk for this type of infection.  Your sexual activity has changed since you were last screened and you are at an increased risk for chlamydia or gonorrhea. Ask your health care provider if you are at risk.  If you do not have HIV, but are at risk, it may be recommended that you take a prescription medicine daily to prevent HIV infection. This is called pre-exposure prophylaxis (PrEP). You are considered at risk if:  You are sexually active and do not regularly use condoms or know the HIV status of your partner(s).  You take drugs by injection.  You are  sexually active with a partner who has HIV. Talk with your health care provider about whether you are at high risk of being infected with HIV. If you choose to begin PrEP, you should first be tested for HIV. You should then be tested every 3 months for as long as you are taking PrEP.  PREGNANCY   If you are premenopausal and you may become pregnant, ask your health care provider about preconception counseling.  If you may become pregnant, take 400 to 800 micrograms (mcg) of folic acid every day.  If you want to prevent pregnancy, talk to your health care provider about birth control (contraception). OSTEOPOROSIS AND MENOPAUSE   Osteoporosis is a disease in which the bones lose minerals and strength with aging. This can result in serious bone fractures. Your risk for osteoporosis can be identified using a bone density scan.  If you are 12 years of age or older, or if you are at risk for osteoporosis and fractures, ask your health care provider if you should be screened.  Ask your health care provider whether you should take a calcium or vitamin D supplement to lower your risk for osteoporosis.  Menopause may have certain physical symptoms and risks.  Hormone replacement therapy may reduce some of these symptoms and risks. Talk to your health care provider about whether hormone replacement therapy is right for you.  HOME CARE INSTRUCTIONS   Schedule regular health, dental, and eye exams.  Stay current with your immunizations.   Do not use any tobacco products including cigarettes, chewing tobacco, or electronic cigarettes.  If you are pregnant, do not drink alcohol.  If you are breastfeeding, limit how much and how often you drink alcohol.  Limit alcohol intake to no more than 1 drink per day for nonpregnant women. One drink equals 12 ounces of beer, 5 ounces of wine, or 1 ounces of hard liquor.  Do not use street drugs.  Do not share needles.  Ask your health care provider for  help if you need support or information about quitting drugs.  Tell your health care provider if you often feel depressed.  Tell your health care provider if you have ever been abused or do not feel safe at home. Document Released: 02/07/2011 Document Revised: 12/09/2013 Document Reviewed: 06/26/2013 [facility] Patient Information 2015 [place], [place]. This information is not intended to replace advice given to you by your health care provider. Make sure you discuss any questions you have with your health care provider.

## 2014-08-06 NOTE — Progress Notes (Signed)
Pre-visit discussion using our clinic review tool. No additional management support is needed unless otherwise documented below in the visit note.  

## 2014-08-09 ENCOUNTER — Encounter: Payer: Self-pay | Admitting: Internal Medicine

## 2014-08-09 DIAGNOSIS — H6120 Impacted cerumen, unspecified ear: Secondary | ICD-10-CM | POA: Insufficient documentation

## 2014-08-09 DIAGNOSIS — Z0000 Encounter for general adult medical examination without abnormal findings: Secondary | ICD-10-CM | POA: Insufficient documentation

## 2014-08-09 NOTE — Assessment & Plan Note (Signed)
BP has improved since Nephrology increased losartan to 100 mg daily earlier in the month..  No changes today

## 2014-08-09 NOTE — Assessment & Plan Note (Signed)
She is asymptomatic currently and has no signs of failure on exam

## 2014-08-09 NOTE — Assessment & Plan Note (Addendum)
She will continue lifelong acid suppression with twice daily protonix,  medication  reviewed indicating of duplicate therapy with omeprazole.

## 2014-08-09 NOTE — Assessment & Plan Note (Signed)
Ears were irrigated today with warm tap water and impaction was resolved.

## 2014-08-09 NOTE — Assessment & Plan Note (Signed)
She has not had an exacerbation in over a year.

## 2014-08-09 NOTE — Assessment & Plan Note (Signed)
Thyroid function was  WNL on current dose in march and neeeds repeating.  No current changes needed.   Lab Results  Component Value Date   TSH 2.36 10/28/2013

## 2014-08-09 NOTE — Assessment & Plan Note (Signed)

## 2014-08-09 NOTE — Assessment & Plan Note (Signed)
Chronic, secondary to concern about her daughter Savannah Wilson, who has MM and ESKD.  The risks and benefits of benzodiazepine use were reviewed with patient today including excessive sedation leading to respiratory depression,  impaired thinking/driving, and addiction.  Patient was advised to avoid concurrent use with alcohol, to use medication only as needed and not to share with others  .

## 2014-08-09 NOTE — Assessment & Plan Note (Signed)
I have congratulated her in reduction of   BMI and encouraged  Continued weight loss with goal of 10% of body weigh over the next 6 months using a low glycemic index diet and regular exercise a minimum of 5 days per week.    

## 2014-08-09 NOTE — Assessment & Plan Note (Signed)
Idiopathic,  Did not tolerate gabapentin due to LE edema and dizziness.  Symptoms are mild and tolerable currently

## 2014-10-15 ENCOUNTER — Encounter: Payer: Self-pay | Admitting: Nurse Practitioner

## 2014-10-15 ENCOUNTER — Ambulatory Visit (INDEPENDENT_AMBULATORY_CARE_PROVIDER_SITE_OTHER): Payer: Medicare Other | Admitting: Nurse Practitioner

## 2014-10-15 VITALS — BP 128/62 | HR 60 | Temp 98.2°F | Resp 14 | Ht 59.5 in | Wt 164.8 lb

## 2014-10-15 DIAGNOSIS — K529 Noninfective gastroenteritis and colitis, unspecified: Secondary | ICD-10-CM

## 2014-10-15 NOTE — Progress Notes (Signed)
Pre visit review using our clinic review tool, if applicable. No additional management support is needed unless otherwise documented below in the visit note. 

## 2014-10-15 NOTE — Progress Notes (Signed)
Subjective:    Patient ID: Savannah Wilson, female    DOB: 08/31/1928, 81 y.o.   MRN: [id number]  HPI  Savannah Wilson is a 81 yo female with a CC of diarrhea, nausea, and vomiting x 3 days. Caregiver is with her today.   1) Monday- Diarrhea and vomiting during night to morning time  Tue/Wed- Diarrhea and nausea- watery  5 am this morning last BM- more formed than previous  Drinking ginger ale, tea  soup- couldn't eat it due to high salt she reports    Toast, jello, peaches in food processor- upset her stomach   3 Immodium and 1 zofran over the course of this illness.   Review of Systems  Constitutional: Positive for chills and fatigue. Negative for fever and diaphoresis.  Respiratory: Negative for chest tightness, shortness of breath and wheezing.   Cardiovascular: Negative for chest pain, palpitations and leg swelling.  Gastrointestinal: Positive for nausea, vomiting and diarrhea. Negative for rectal pain.  Skin: Negative for rash.  Neurological: Negative for dizziness, weakness, numbness and headaches.  Psychiatric/Behavioral: The patient is not nervous/anxious.    Past Medical History  Diagnosis Date  . Hypothyroid   . Hyperlipidemia   . Diverticulosis   . Peripheral neuralgia   . Hyperkalemia   . Hypertension   . Asthma   . Depression   . GERD (gastroesophageal reflux disease)   . Venous insufficiency   . Barrett esophagus   . Chronic bronchitis   . Diverticular disease   . Renal insufficiency   . Gout   . Pancreatitis due to biliary obstruction March 2010    s/p ERCP sphincterotomy, cholecystectomy    History   Social History  . Marital Status: Widowed    Spouse Name: N/A  . Number of Children: 2  . Years of Education: N/A   Occupational History  . 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 Smoking status: Never Smoker   . Smokeless tobacco: Never Used  . Alcohol Use: No     Comment: rare  . Drug Use: No  . Sexual Activity: No   Other  Topics Concern  . Not on file   Social History Narrative   ** Merged History Encounter **       Widowed. Has 2 children and lives alone.    Past Surgical History  Procedure Laterality Date  . Ventral hernia repair    . Cataract extraction      right  . Cholecystectomy  02/10  . Vesicovaginal fistula closure w/ tah    . Tonsillectomy    . Ventral hernia repair  2007  . Cataract extraction  2008  . Cholecystectomy  2010  . Carpal tunnel release  jan 2013    Savannah Wilson    Family History  Problem Relation Age of Onset  . Kidney failure Mother   . Hypertension Mother   . Kidney disease Mother   . Multiple myeloma Daughter   . Cancer Daughter     multiple myeloma  . Kidney disease Daughter   . Heart disease Father   . Rheumatologic disease Father   . Kidney cancer Father     Allergies  Allergen Reactions  . Ace Inhibitors Cough  . Benicar [Olmesartan Medoxomil]   . Clarithromycin Other (See Comments)    Blisters in mouth  . Clarithromycin Other (See Comments)    Unknown   . Norvasc [Amlodipine Besylate]   . Levofloxacin Rash  .  Zithromax [Azithromycin Dihydrate] Rash    Current Outpatient Prescriptions on File Prior to Visit  Medication Sig Dispense Refill  . acetaminophen (TYLENOL) 650 MG CR tablet Take 650 mg by mouth every 8 (eight) hours as needed.    . albuterol (PROVENTIL HFA;VENTOLIN HFA) 108 (90 BASE) MCG/ACT inhaler Inhale 2 puffs into the lungs every 6 (six) hours as needed for wheezing. 1 Inhaler 11  . ALPRAZolam (XANAX) 0.5 MG tablet TAKE ONE TABLET BY MOUTH TWICE DAILY AS NEEDED 45 tablet 5  . Beclomethasone Dipropionate 80 MCG/ACT AERS Place 2 sprays into the nose daily. 8.7 g 11  . cyanocobalamin ([facility] VITAMIN B12) 2000 MCG tablet Take 1 tablet (2,000 mcg total) by mouth daily. 30 tablet 1  . DiphenhydrAMINE HCl (BENADRYL ALLERGY PO) Take 1 tablet by mouth at bedtime. Take by mouth as needed.    . fexofenadine (ALLEGRA) 180 MG tablet Take 1 tablet  (180 mg total) by mouth every morning. 30 tablet 2  . fluticasone (FLONASE) 50 MCG/ACT nasal spray Place 2 sprays into the nose daily. 16 g 6  . furosemide (LASIX) 40 MG tablet TAKE ONE TABLET BY MOUTH EVERY DAY 30 tablet 6  . hydrALAZINE (APRESOLINE) 25 MG tablet Take 1 tablet (25 mg total) by mouth 3 (three) times daily. 30 tablet 3  . hydrOXYzine (ATARAX/VISTARIL) 25 MG tablet Take 1 tablet (25 mg total) by mouth 3 (three) times daily as needed. 30 tablet 0  . levothyroxine (SYNTHROID, LEVOTHROID) 100 MCG tablet TAKE ONE TABLET BY MOUTH EVERY DAY 90 tablet 3  . loperamide (IMODIUM A-D) 2 MG tablet Take 2 mg by mouth 4 (four) times daily as needed.      . losartan (COZAAR) 100 MG tablet Take 1 tablet (100 mg total) by mouth daily. 30 tablet 3  . metoprolol tartrate (LOPRESSOR) 25 MG tablet Take 1 tablet (25 mg total) by mouth 2 (two) times daily. 60 tablet 5  . ondansetron (ZOFRAN) 4 MG tablet Take 4 mg by mouth every 4 (four) hours as needed. For nausea and vomiting    . pantoprazole (PROTONIX) 40 MG tablet TAKE 1 TABLET BY MOUTH TWICE DAILY BEFORE A MEAL 60 tablet 5  . pramipexole (MIRAPEX) 0.125 MG tablet Take 1 tablet (0.125 mg total) by mouth 3 (three) times daily. 30 tablet 11  . sertraline (ZOLOFT) 50 MG tablet Take 1 tablet (50 mg total) by mouth daily. 90 tablet 3  . VITAMIN D, ERGOCALCIFEROL, PO Take 1.25 mg by mouth every 30 (thirty) days.     Current Facility-Administered Medications on File Prior to Visit  Medication Dose Route Frequency Provider Last Rate Last Dose  . albuterol (PROVENTIL) (2.5 MG/3ML) 0.083% nebulizer solution 2.5 mg  2.5 mg Nebulization Once Teresa L [name], MD           Objective:   Physical Exam  Constitutional: She is oriented to person, place, and time. She appears well-developed and well-nourished. No distress.  BP 128/62 mmHg  Pulse 60  Temp(Src) 98.2 F (36.8 C)  Resp 14  Ht 4' 11.5" (1.511 m)  Wt 164 lb 12.8 oz (74.753 kg)  BMI 32.74 kg/m2   SpO2 98% Orthostatics: 126/68 64 pulse stand, 138/68 56 pulse lying, sitting 128/62 60 pulse    HENT:  Head: Normocephalic and atraumatic.  Right Ear: External ear normal.  Left Ear: External ear normal.  Eyes: EOM are normal. Pupils are equal, round, and reactive to light. Right eye exhibits discharge. Left eye exhibits discharge. No   scleral icterus.  Watery discharge bilateral eyes  Neck: Normal range of motion. Neck supple. No thyromegaly present.  Cardiovascular: Normal rate, regular rhythm, normal heart sounds and intact distal pulses.  Exam reveals no gallop and no friction rub.   No murmur heard. Pulmonary/Chest: Effort normal and breath sounds normal. No respiratory distress. She has no wheezes. She has no rales. She exhibits no tenderness.  Abdominal: Soft. Bowel sounds are normal. She exhibits no distension and no mass. There is no tenderness. There is no rebound and no guarding.  Lymphadenopathy:    She has no cervical adenopathy.  Neurological: She is alert and oriented to person, place, and time. No cranial nerve deficit. She exhibits normal muscle tone. Coordination normal.  Skin: Skin is warm and dry. No rash noted. She is not diaphoretic.  Psychiatric: She has a normal mood and affect. Her behavior is normal. Judgment and thought content normal.      Assessment & Plan:

## 2014-10-15 NOTE — Patient Instructions (Addendum)
Come back for re-check in 2 days.   Norovirus Infection Norovirus illness is caused by a viral infection. The term norovirus refers to a group of viruses. Any of those viruses can cause norovirus illness. This illness is often referred to by other names such as viral gastroenteritis, stomach flu, and food poisoning. Anyone can get a norovirus infection. People can have the illness multiple times during their lifetime. CAUSES  Norovirus is found in the stool or vomit of infected people. It is easily spread from person to person (contagious). People with norovirus are contagious from the moment they begin feeling ill. They may remain contagious for as long as 3 days to 2 weeks after recovery. People can become infected with the virus in several ways. This includes:  Eating food or drinking liquids that are contaminated with norovirus.  Touching surfaces or objects contaminated with norovirus, and then placing your hand in your mouth.  Having direct contact with a person who is infected and shows symptoms. This may occur while caring for someone with illness or while sharing foods or eating utensils with someone who is ill. SYMPTOMS  Symptoms usually begin 1 to 2 days after ingestion of the virus. Symptoms may include:  Nausea.  Vomiting.  Diarrhea.  Stomach cramps.  Low-grade fever.  Chills.  Headache.  Muscle aches.  Tiredness. Most people with norovirus illness get better within 1 to 2 days. Some people become dehydrated because they cannot drink enough liquids to replace those lost from vomiting and diarrhea. This is especially true for young children, the elderly, and others who are unable to care for themselves. DIAGNOSIS  Diagnosis is based on your symptoms and exam. Currently, only state public health laboratories have the ability to test for norovirus in stool or vomit. TREATMENT  No specific treatment exists for norovirus infections. No vaccine is available to prevent  infections. Norovirus illness is usually brief in healthy people. If you are ill with vomiting and diarrhea, you should drink enough water and fluids to keep your urine clear or pale yellow. Dehydration is the most serious health effect that can result from this infection. By drinking oral rehydration solution (ORS), people can reduce their chance of becoming dehydrated. There are many commercially available pre-made and powdered ORS designed to safely rehydrate people. These may be recommended by your caregiver. Replace any new fluid losses from diarrhea or vomiting with ORS as follows:  If your child weighs 10 kg or less (22 lb or less), give 60 to 120 ml ( to  cup or 2 to 4 oz) of ORS for each diarrheal stool or vomiting episode.  If your child weighs more than 10 kg (more than 22 lb), give 120 to 240 ml ( to 1 cup or 4 to 8 oz) of ORS for each diarrheal stool or vomiting episode. HOME CARE INSTRUCTIONS   Follow all your caregiver's instructions.  Avoid sugar-free and alcoholic drinks while ill.  Only take over-the-counter or prescription medicines for pain, vomiting, diarrhea, or fever as directed by your caregiver. You can decrease your chances of coming in contact with norovirus or spreading it by following these steps:  Frequently wash your hands, especially after using the toilet, changing diapers, and before eating or preparing food.  Carefully wash fruits and vegetables. Cook shellfish before eating them.  Do not prepare food for others while you are infected and for at least 3 days after recovering from illness.  Thoroughly clean and disinfect contaminated surfaces immediately after an  episode of illness using a bleach-based household cleaner.  Immediately remove and wash clothing or linens that may be contaminated with the virus.  Use the toilet to dispose of any vomit or stool. Make sure the surrounding area is kept clean.  Food that may have been contaminated by an ill  person should be discarded. SEEK IMMEDIATE MEDICAL CARE IF:   You develop symptoms of dehydration that do not improve with fluid replacement. This may include:  Excessive sleepiness.  Lack of tears.  Dry mouth.  Dizziness when standing.  Weak pulse. Document Released: 10/15/2002 Document Revised: 10/17/2011 Document Reviewed: 11/16/2009 [employer] Patient Information 2015 [place], [place]. This information is not intended to replace advice given to you by your health care provider. Make sure you discuss any questions you have with your health care provider.

## 2014-10-16 DIAGNOSIS — K529 Noninfective gastroenteritis and colitis, unspecified: Secondary | ICD-10-CM | POA: Insufficient documentation

## 2014-10-16 NOTE — Assessment & Plan Note (Signed)
Not orthostatic. Gave patient information about what to try and eat (BRAT diet) and clear liquids. Watch sugary drinks and foods. Follow up in 2 days.

## 2014-10-17 ENCOUNTER — Encounter: Payer: Self-pay | Admitting: Nurse Practitioner

## 2014-10-17 ENCOUNTER — Other Ambulatory Visit: Payer: Self-pay | Admitting: Nurse Practitioner

## 2014-10-17 ENCOUNTER — Ambulatory Visit (INDEPENDENT_AMBULATORY_CARE_PROVIDER_SITE_OTHER): Payer: Medicare Other | Admitting: Nurse Practitioner

## 2014-10-17 VITALS — BP 122/62 | HR 74 | Temp 97.4°F | Resp 14 | Ht 59.5 in | Wt 163.8 lb

## 2014-10-17 DIAGNOSIS — E875 Hyperkalemia: Secondary | ICD-10-CM

## 2014-10-17 DIAGNOSIS — K529 Noninfective gastroenteritis and colitis, unspecified: Secondary | ICD-10-CM

## 2014-10-17 DIAGNOSIS — I10 Essential (primary) hypertension: Secondary | ICD-10-CM

## 2014-10-17 NOTE — Patient Instructions (Signed)
You can start eating normal foods again and introduce slowly.   Let us know if you need anything!

## 2014-10-17 NOTE — Progress Notes (Signed)
Pre visit review using our clinic review tool, if applicable. No additional management support is needed unless otherwise documented below in the visit note. 

## 2014-10-17 NOTE — Progress Notes (Signed)
   Subjective:    Patient ID: Savannah Wilson, female    DOB: 1929-05-03, 81 y.o.   MRN: [id number]  HPI  Savannah Wilson is a 81 yo female here for a 2 day follow up of gastroenteritis.   1) Feeling improved. Following BRAT diet and would like to eat meat now. She reports having one loose to watery stool yesterday. She feels stronger and drove herself today. No BM today.       Review of Systems  Constitutional: Negative for fever, chills, diaphoresis and fatigue.  Respiratory: Negative for chest tightness, shortness of breath and wheezing.   Cardiovascular: Negative for chest pain, palpitations and leg swelling.  Gastrointestinal: Positive for diarrhea. Negative for nausea, vomiting, abdominal pain, blood in stool, abdominal distention and rectal pain.  Skin: Negative for rash.  Neurological: Negative for dizziness, weakness, numbness and headaches.  Psychiatric/Behavioral: The patient is not nervous/anxious.        Objective:   Physical Exam  Constitutional: She is oriented to person, place, and time. She appears well-developed and well-nourished. No distress.  BP 122/62 mmHg  Pulse 74  Temp(Src) 97.4 F (36.3 C) (Oral)  Resp 14  Ht 4' 11.5" (1.511 m)  Wt 163 lb 12.8 oz (74.299 kg)  BMI 32.54 kg/m2  SpO2 98%   HENT:  Head: Normocephalic and atraumatic.  Right Ear: External ear normal.  Left Ear: External ear normal.  Cardiovascular: Normal rate, regular rhythm, normal heart sounds and intact distal pulses.  Exam reveals no gallop and no friction rub.   No murmur heard. Pulmonary/Chest: Effort normal and breath sounds normal. No respiratory distress. She has no wheezes. She has no rales. She exhibits no tenderness.  Abdominal: Soft. Bowel sounds are normal. She exhibits no distension and no mass. There is no tenderness. There is no rebound and no guarding.  Neurological: She is alert and oriented to person, place, and time. No cranial nerve deficit. She exhibits normal muscle  tone. Coordination normal.  Skin: Skin is warm and dry. No rash noted. She is not diaphoretic.  Psychiatric: She has a normal mood and affect. Her behavior is normal. Judgment and thought content normal.          Assessment & Plan:

## 2014-10-20 ENCOUNTER — Telehealth: Payer: Self-pay | Admitting: Internal Medicine

## 2014-10-20 NOTE — Telephone Encounter (Signed)
emmi emailed °

## 2014-10-21 NOTE — Assessment & Plan Note (Signed)
Pt is improved. Asked to to slowly add meat back into her diet and back off if worsening. Continue to watch eating sugary drinks/foods. FU prn worsening/failure to improve.

## 2014-10-24 ENCOUNTER — Other Ambulatory Visit: Payer: Self-pay | Admitting: Internal Medicine

## 2014-10-28 ENCOUNTER — Other Ambulatory Visit (INDEPENDENT_AMBULATORY_CARE_PROVIDER_SITE_OTHER): Payer: Medicare Other

## 2014-10-28 DIAGNOSIS — E875 Hyperkalemia: Secondary | ICD-10-CM

## 2014-10-28 DIAGNOSIS — I10 Essential (primary) hypertension: Secondary | ICD-10-CM

## 2014-10-28 LAB — CBC WITH DIFFERENTIAL/PLATELET
BASOS ABS: 0 10*3/uL (ref 0.0–0.1)
Basophils Relative: 0.5 % (ref 0.0–3.0)
EOS ABS: 0.1 10*3/uL (ref 0.0–0.7)
EOS PCT: 2.2 % (ref 0.0–5.0)
HCT: 30.7 % — ABNORMAL LOW (ref 36.0–46.0)
HEMOGLOBIN: 10.2 g/dL — AB (ref 12.0–15.0)
Lymphocytes Relative: 23.5 % (ref 12.0–46.0)
Lymphs Abs: 1.5 10*3/uL (ref 0.7–4.0)
MCHC: 33.2 g/dL (ref 30.0–36.0)
MCV: 88.4 fl (ref 78.0–100.0)
Monocytes Absolute: 0.4 10*3/uL (ref 0.1–1.0)
Monocytes Relative: 6 % (ref 3.0–12.0)
NEUTROS ABS: 4.3 10*3/uL (ref 1.4–7.7)
Neutrophils Relative %: 67.8 % (ref 43.0–77.0)
Platelets: 264 10*3/uL (ref 150.0–400.0)
RBC: 3.47 Mil/uL — AB (ref 3.87–5.11)
RDW: 14.2 % (ref 11.5–15.5)
WBC: 6.3 10*3/uL (ref 4.0–10.5)

## 2014-10-28 LAB — BASIC METABOLIC PANEL
BUN: 32 mg/dL — ABNORMAL HIGH (ref 6–23)
CALCIUM: 8.7 mg/dL (ref 8.4–10.5)
CO2: 27 mEq/L (ref 19–32)
Chloride: 109 mEq/L (ref 96–112)
Creatinine, Ser: 1.67 mg/dL — ABNORMAL HIGH (ref 0.40–1.20)
GFR: 31.1 mL/min — ABNORMAL LOW (ref >60.00)
GLUCOSE: 92 mg/dL (ref 70–99)
Potassium: 4.3 mEq/L (ref 3.5–5.1)
SODIUM: 142 meq/L (ref 135–145)

## 2014-10-28 LAB — TSH: TSH: 1.62 u[IU]/mL (ref 0.35–4.50)

## 2014-11-05 ENCOUNTER — Ambulatory Visit: Payer: Medicare Other | Admitting: Internal Medicine

## 2014-11-30 NOTE — Op Note (Signed)
PATIENT NAME:  [name], Savannah Wilson MR#:  [id number] DATE OF BIRTH:  02/06/1929  DATE OF PROCEDURE:  11/15/2011  PREOPERATIVE DIAGNOSIS: Medial meniscus tear, right knee.   POSTOPERATIVE DIAGNOSES:  1. Lateral meniscus tear, right knee.  2. Large medial suprapatellar plica.   PROCEDURES:  1. Arthroscopic partial lateral meniscectomy, right knee.  2. Excision of large medial plica band.   SURGEON: Lucas Mallow, MD    ANESTHESIA: General.   COMPLICATIONS: None.   PROCEDURE: After adequate induction of general anesthesia, the right lower extremity is placed in the legholder in the usual manner for arthroscopy. The right knee and leg are thoroughly prepped with alcohol and ChloraPrep and draped in standard sterile fashion. The joint is infiltrated with 10 mL of Marcaine with epinephrine. Diagnostic arthroscopy is performed in standard fashion. There is moderate chondromalacia on the posterior aspect of the patella. There is a large medial suprapatellar plica present. Using a combination of the full radial resector and the ArthroWand, this is completely resected back to its origin on the medial retinaculum. In the medial compartment there is seen to be no abnormality in the meniscus and there is only mild chondromalacia. The anterior cruciate ligament is intact but is somewhat loose. In the lateral compartment, there is a tear of the anterior horn in the midportion of the meniscus. The articular surfaces look relatively good. Using the ArthroWand, the torn portion of the meniscus is resected back to a stable rim. Careful search for any other pathology is made and none is seen. Wound is thoroughly irrigated multiple times. Skin edges are closed with 4-0 nylon. The joint is infiltrated with 15 mL of Marcaine with epinephrine and 4 mg of morphine. Soft bulky dressing is applied.     The patient is returned to the recovery room in satisfactory condition having tolerated the procedure quite well.    ____________________________ Lucas Mallow, MD ces:drc D: 11/15/2011 11:05:31 ET T: 11/15/2011 12:22:37 ET JOB#: [id number]  cc: Lucas Mallow, MD, <Dictator> Lucas Mallow MD ELECTRONICALLY SIGNED 11/16/2011 17:09

## 2014-11-30 NOTE — Op Note (Signed)
PATIENT NAME:  Savannah Wilson, Savannah Wilson MR#:  [id number] DATE OF BIRTH:  February 14, 1929  DATE OF PROCEDURE:  08/19/2011  PREOPERATIVE DIAGNOSIS: Right carpal tunnel syndrome.   POSTOPERATIVE DIAGNOSIS: Right carpal tunnel syndrome.   PROCEDURE: Right carpal tunnel release.   SURGEON: Lucas Mallow, MD   ANESTHESIA: IV regional.   COMPLICATIONS: None.   TOURNIQUET TIME: Approximately 25 minutes.   DESCRIPTION OF PROCEDURE: After adequate induction of IV regional anesthesia and sedation for the patient, the right upper extremity is thoroughly prepped with alcohol and ChloraPrep and draped in standard sterile fashion. Under loupe magnification, standard volar carpal tunnel incision is made and the dissection carried down to the transverse retinacular ligament. This is incised in the midportion. The distal release is performed with the small scissors. The proximal release is performed with the small scissors and the carpal tunnel scissors. There is seen to be severe compression of the nerve directly beneath the transverse ligament. Careful check is made both proximally and distally to ensure that complete release had been obtained. The wound is thoroughly irrigated multiple times. Skin edges are closed with 4-0 nylon. Soft bulky dressing is applied. Tourniquet is released. The patient is returned to the recovery room in satisfactory condition having tolerated the procedure quite well.   ____________________________ Lucas Mallow, MD ces:drc D: 08/19/2011 10:14:35 ET T: 08/19/2011 13:36:47 ET JOB#: [id number]  cc: Lucas Mallow, MD, <Dictator> Lucas Mallow MD ELECTRONICALLY SIGNED 08/23/2011 13:33

## 2014-12-10 ENCOUNTER — Other Ambulatory Visit: Payer: Self-pay | Admitting: Internal Medicine

## 2014-12-25 ENCOUNTER — Other Ambulatory Visit: Payer: Self-pay | Admitting: Internal Medicine

## 2015-01-22 ENCOUNTER — Other Ambulatory Visit: Payer: Self-pay | Admitting: Internal Medicine

## 2015-01-27 ENCOUNTER — Ambulatory Visit (INDEPENDENT_AMBULATORY_CARE_PROVIDER_SITE_OTHER): Payer: Medicare Other | Admitting: Internal Medicine

## 2015-01-27 ENCOUNTER — Encounter: Payer: Self-pay | Admitting: Internal Medicine

## 2015-01-27 ENCOUNTER — Other Ambulatory Visit: Payer: Self-pay | Admitting: *Deleted

## 2015-01-27 VITALS — BP 138/84 | HR 51 | Temp 98.0°F | Resp 12 | Ht 60.0 in | Wt 165.8 lb

## 2015-01-27 DIAGNOSIS — I872 Venous insufficiency (chronic) (peripheral): Secondary | ICD-10-CM | POA: Diagnosis not present

## 2015-01-27 DIAGNOSIS — Z7189 Other specified counseling: Secondary | ICD-10-CM

## 2015-01-27 DIAGNOSIS — I5022 Chronic systolic (congestive) heart failure: Secondary | ICD-10-CM

## 2015-01-27 DIAGNOSIS — N3946 Mixed incontinence: Secondary | ICD-10-CM

## 2015-01-27 DIAGNOSIS — S46111A Strain of muscle, fascia and tendon of long head of biceps, right arm, initial encounter: Secondary | ICD-10-CM | POA: Diagnosis not present

## 2015-01-27 DIAGNOSIS — N184 Chronic kidney disease, stage 4 (severe): Secondary | ICD-10-CM

## 2015-01-27 DIAGNOSIS — S46211A Strain of muscle, fascia and tendon of other parts of biceps, right arm, initial encounter: Secondary | ICD-10-CM

## 2015-01-27 DIAGNOSIS — R208 Other disturbances of skin sensation: Secondary | ICD-10-CM

## 2015-01-27 DIAGNOSIS — R200 Anesthesia of skin: Secondary | ICD-10-CM

## 2015-01-27 MED ORDER — DERMACLOUD EX CREA: TOPICAL_CREAM | CUTANEOUS | Status: DC

## 2015-01-27 NOTE — Patient Instructions (Addendum)
You strained your biceps tendon ! This will improve with rest and ice  and tylenol for pain    Your vaginal irritation should improve by using a barrier cream on your skin to protect it from the urine on your pad  Make sure you are not using "deodorant" liners   If it does not improve, call for an alternative

## 2015-01-27 NOTE — Progress Notes (Signed)
Subjective:  Patient ID: Savannah Wilson, female    DOB: 08/24/1928  Age: 81 y.o. MRN: [id number]  CC: The primary encounter diagnosis was Biceps muscle strain, right, initial encounter. Diagnoses of Systolic CHF, chronic, Venous insufficiency, CKD (chronic kidney disease), stage IV, Bilateral hand numbness, Urinary incontinence, mixed, and Counseling regarding advanced directives and goals of care were also pertinent to this visit.  HPI Savannah Wilson presents for follow up on chronic medical conditions including CKD stage 4,  hypertension, systolic dysfunction, and new complaints. She is taking her medications as directed and denies any adverse events.  Weight has been stable.   1)   new onset pain in the right upper arm in the biceps region which occurred after lifting a heavy object several days ago.  She denies any bruising , but reports tenderness to the bicep, and pain with flexion of arm.   2) CKD:  Baseline GFR has dropped from 30 to 23 ml/min per repeat assessment by [facility].   it has been stable for 3 weeks.  Has eliminated all beverages but water. Does not want dialysis.  End of life goals discussed today.  She prefers to remain  Full CODE , but has advance directives in place to prevent long term ventilatory support.   3) bilateral hand tingling :  Occurs when she talks on  the phone for long periods of time, and when holding up a book to read for more than a few minutes.  Not dropping things,    Can feel heat or cold.   4) urinary incontinence,  Wearing a pad.  Vaginal folds itching a lot due to contact with the urine   Outpatient Prescriptions Prior to Visit  Medication Sig Dispense Refill  . acetaminophen (TYLENOL) 650 MG CR tablet Take 650 mg by mouth every 8 (eight) hours as needed.    Marland Kitchen albuterol (PROVENTIL HFA;VENTOLIN HFA) 108 (90 BASE) MCG/ACT inhaler Inhale 2 puffs into the lungs every 6 (six) hours as needed for wheezing. 1 Inhaler 11  . ALPRAZolam (XANAX) 0.5 MG tablet  TAKE ONE TABLET BY MOUTH TWICE DAILY AS NEEDED 60 tablet 3  . Beclomethasone Dipropionate 80 MCG/ACT AERS Place 2 sprays into the nose daily. 8.7 g 11  . cyanocobalamin ([facility] VITAMIN B12) 2000 MCG tablet Take 1 tablet (2,000 mcg total) by mouth daily. 30 tablet 1  . DiphenhydrAMINE HCl (BENADRYL ALLERGY PO) Take 1 tablet by mouth at bedtime. Take by mouth as needed.    . fexofenadine (ALLEGRA) 180 MG tablet Take 1 tablet (180 mg total) by mouth every morning. 30 tablet 2  . fluticasone (FLONASE) 50 MCG/ACT nasal spray Place 2 sprays into the nose daily. 16 g 6  . furosemide (LASIX) 40 MG tablet TAKE ONE TABLET BY MOUTH EVERY DAY 30 tablet 5  . hydrALAZINE (APRESOLINE) 25 MG tablet Take 1 tablet (25 mg total) by mouth 3 (three) times daily. 30 tablet 3  . hydrOXYzine (ATARAX/VISTARIL) 25 MG tablet Take 1 tablet (25 mg total) by mouth 3 (three) times daily as needed. 30 tablet 0  . levothyroxine (SYNTHROID, LEVOTHROID) 100 MCG tablet TAKE ONE TABLET BY MOUTH EVERY DAY. 90 tablet 3  . loperamide (IMODIUM A-D) 2 MG tablet Take 2 mg by mouth 4 (four) times daily as needed.      Marland Kitchen losartan (COZAAR) 100 MG tablet Take 1 tablet (100 mg total) by mouth daily. 30 tablet 3  . metoprolol tartrate (LOPRESSOR) 25 MG tablet Take 1 tablet (  25 mg total) by mouth 2 (two) times daily. 60 tablet 5  . omeprazole (PRILOSEC) 40 MG capsule TAKE ONE CAPSULE BY MOUTH TWICE A DAY 30 MINUTES BEFORE MEALS. 60 capsule 5  . ondansetron (ZOFRAN) 4 MG tablet Take 4 mg by mouth every 4 (four) hours as needed. For nausea and vomiting    . pantoprazole (PROTONIX) 40 MG tablet TAKE 1 TABLET BY MOUTH TWICE DAILY BEFORE A MEAL 60 tablet 5  . pramipexole (MIRAPEX) 0.125 MG tablet Take 1 tablet (0.125 mg total) by mouth 3 (three) times daily. 30 tablet 11  . sertraline (ZOLOFT) 50 MG tablet Take 1 tablet (50 mg total) by mouth daily. 90 tablet 3  . VITAMIN D, ERGOCALCIFEROL, PO Take 1.25 mg by mouth every 30 (thirty) days.    Marland Kitchen omeprazole  (PRILOSEC) 40 MG capsule Take 40 mg by mouth daily.     Facility-Administered Medications Prior to Visit  Medication Dose Route Frequency Provider Last Rate Last Dose  . albuterol (PROVENTIL) (2.5 MG/3ML) 0.083% nebulizer solution 2.5 mg  2.5 mg Nebulization Once [name], MD        Review of Systems;  Patient denies headache, fevers, malaise, unintentional weight loss, skin rash, eye pain, sinus congestion and sinus pain, sore throat, dysphagia,  hemoptysis , cough, dyspnea, wheezing, chest pain, palpitations, orthopnea, edema, abdominal pain, nausea, melena, diarrhea, constipation, flank pain, dysuria, hematuria, urinary  Frequency, nocturia, numbness, tingling, seizures,  Focal weakness, Loss of consciousness,  Tremor, insomnia, depression, anxiety, and suicidal ideation.      Objective:  BP 138/84 mmHg  Pulse 51  Temp(Src) 98 F (36.7 C) (Oral)  Resp 12  Ht 5' (1.524 m)  Wt 165 lb 12 oz (75.184 kg)  BMI 32.37 kg/m2  SpO2 98%  BP Readings from Last 3 Encounters:  01/27/15 138/84  10/17/14 122/62  10/15/14 128/62    Wt Readings from Last 3 Encounters:  01/27/15 165 lb 12 oz (75.184 kg)  10/17/14 163 lb 12.8 oz (74.299 kg)  10/15/14 164 lb 12.8 oz (74.753 kg)    General appearance: alert, cooperative and appears stated age Ears: normal TM's and external ear canals both ears Throat: lips, mucosa, and tongue normal; teeth and gums normal Neck: no adenopathy, no carotid bruit, supple, symmetrical, trachea midline and thyroid not enlarged, symmetric, no tenderness/mass/nodules Back: symmetric, no curvature. ROM normal. No CVA tenderness. Lungs: clear to auscultation bilaterally Heart: regular rate and rhythm, S1, S2 normal, no murmur, click, rub or gallop Abdomen: soft, non-tender; bowel sounds normal; no masses,  no organomegaly Pulses: 2+ and symmetric Skin: Skin color, texture, turgor normal. No rashes or lesions Lymph nodes: Cervical, supraclavicular, and axillary  nodes normal.  No results found for: HGBA1C  Lab Results  Component Value Date   CREATININE 1.67* 10/28/2014   CREATININE 1.6* 06/09/2014   CREATININE 1.8* 03/17/2014    Lab Results  Component Value Date   WBC 6.3 10/28/2014   HGB 10.2* 10/28/2014   HCT 30.7* 10/28/2014   PLT 264.0 10/28/2014   GLUCOSE 92 10/28/2014   CHOL 116 10/28/2013   TRIG 159.0* 10/28/2013   HDL 26.30* 10/28/2013   LDLCALC 58 10/28/2013   ALT 12 10/28/2013   AST 16 10/28/2013   NA 142 10/28/2014   K 4.3 10/28/2014   CL 109 10/28/2014   CREATININE 1.67* 10/28/2014   BUN 32* 10/28/2014   CO2 27 10/28/2014   TSH 1.62 10/28/2014    No results found.  Assessment & Plan:  Problem List Items Addressed This Visit    Venous insufficiency    Managed with daily use of compression stocings.       CKD (chronic kidney disease), stage IV    Secondary to hypertension.  She has no plans to consider dialysis given her age and her observations of her daughter Savannah Wilson's lifestyle.  She is aware that her life expectancy is reduced and she has advance dierctives in place.       Systolic CHF, chronic    She has diastolic and systolic dysfunction , with last EF 45% by 2012 ECHO and no cardiology follow up since then.  She is dypneic with exertion but not at rest, and her weight is stable and BP well controlled. Given her recent decline in GFR her risk of recurrent CHF is greater,  Will refer her back to [facility] for ECHO if she becomes symptomatic      Biceps muscle strain - Primary    Minor, without bruising or swelling.  Advised to apply ice to biceps and use tylenol for pain .       Bilateral hand numbness    Episodic, brought on prolonged flexion of  elbow . She does not desire workup at this time given her age and life expectancy       Urinary incontinence, mixed    Causing irritation of vaginal folds due to contact with urine. Derma cloud prescribed to apply to irritated vaginal folds.        Counseling regarding advanced directives and goals of care    She has advance directives and has been asked to submit a copy for her chart.  She desires to remain FULL CODE for acute decompensations but does not want to be sustained indefinitely.         A total of 40 minutes was spent with patient more than half of which was spent in counseling patient on the above mentioned issues , reviewing and explaining recent labs and imaging studies done, and coordination of care.  I am having Ms. Freese start on [place]. I am also having her maintain her loperamide, acetaminophen, DiphenhydrAMINE HCl (BENADRYL ALLERGY PO), losartan, hydrALAZINE, sertraline, ondansetron, cyanocobalamin, albuterol, Beclomethasone Dipropionate, fluticasone, pramipexole, (VITAMIN D, ERGOCALCIFEROL, PO), pantoprazole, fexofenadine, metoprolol tartrate, hydrOXYzine, ALPRAZolam, levothyroxine, omeprazole, and furosemide. We will continue to administer albuterol.  Meds ordered this encounter  Medications  . Infant Care Products (DERMACLOUD) CREA    Sig: Apply liberally to  Vaginal folds.    Dispense:  430 g    Refill:  0    There are no discontinued medications.  Follow-up: Return in about 6 months (around 07/29/2015).   [name], MD

## 2015-01-29 DIAGNOSIS — S46219A Strain of muscle, fascia and tendon of other parts of biceps, unspecified arm, initial encounter: Secondary | ICD-10-CM | POA: Insufficient documentation

## 2015-01-29 DIAGNOSIS — Z7189 Other specified counseling: Secondary | ICD-10-CM | POA: Insufficient documentation

## 2015-01-29 DIAGNOSIS — R200 Anesthesia of skin: Secondary | ICD-10-CM | POA: Insufficient documentation

## 2015-01-29 DIAGNOSIS — N3946 Mixed incontinence: Secondary | ICD-10-CM | POA: Insufficient documentation

## 2015-01-29 NOTE — Assessment & Plan Note (Signed)
Episodic, brought on prolonged flexion of  elbow . She does not desire workup at this time given her age and life expectancy

## 2015-01-29 NOTE — Assessment & Plan Note (Signed)
She has diastolic and systolic dysfunction , with last EF 45% by 2012 ECHO and no cardiology follow up since then.  She is dypneic with exertion but not at rest, and her weight is stable and BP well controlled. Given her recent decline in GFR her risk of recurrent CHF is greater,  Will refer her back to [place] [facility] for ECHO if she becomes symptomatic

## 2015-01-29 NOTE — Assessment & Plan Note (Signed)
Causing irritation of vaginal folds due to contact with urine. Derma cloud prescribed to apply to irritated vaginal folds.

## 2015-01-29 NOTE — Assessment & Plan Note (Signed)
Managed with daily use of compression stocings.

## 2015-01-29 NOTE — Assessment & Plan Note (Signed)
Minor, without bruising or swelling.  Advised to apply ice to biceps and use tylenol for pain .

## 2015-01-29 NOTE — Assessment & Plan Note (Addendum)
She has advance directives and has been asked to submit a copy for her chart.  She desires to remain FULL CODE for acute decompensations but does not want to be sustained indefinitely.

## 2015-01-29 NOTE — Assessment & Plan Note (Signed)
Secondary to hypertension.  She has no plans to consider dialysis given her age and her observations of her daughter Pam's lifestyle.  She is aware that her life expectancy is reduced and she has advance dierctives in place.

## 2015-02-24 ENCOUNTER — Encounter: Payer: Self-pay | Admitting: Internal Medicine

## 2015-03-13 ENCOUNTER — Other Ambulatory Visit: Payer: Self-pay | Admitting: Internal Medicine

## 2015-03-24 ENCOUNTER — Other Ambulatory Visit: Payer: Self-pay | Admitting: Internal Medicine

## 2015-06-08 ENCOUNTER — Telehealth: Payer: Self-pay | Admitting: Internal Medicine

## 2015-06-08 ENCOUNTER — Emergency Department
Admission: EM | Admit: 2015-06-08 | Discharge: 2015-06-08 | Disposition: A | Discharge status: Home / Self Care | Payer: Medicare Other | Attending: Emergency Medicine | Admitting: Emergency Medicine

## 2015-06-08 ENCOUNTER — Emergency Department: Payer: Medicare Other

## 2015-06-08 DIAGNOSIS — R079 Chest pain, unspecified: Secondary | ICD-10-CM | POA: Diagnosis present

## 2015-06-08 DIAGNOSIS — Z79899 Other long term (current) drug therapy: Secondary | ICD-10-CM | POA: Insufficient documentation

## 2015-06-08 DIAGNOSIS — R059 Cough, unspecified: Secondary | ICD-10-CM

## 2015-06-08 DIAGNOSIS — J45901 Unspecified asthma with (acute) exacerbation: Secondary | ICD-10-CM | POA: Insufficient documentation

## 2015-06-08 DIAGNOSIS — R05 Cough: Secondary | ICD-10-CM

## 2015-06-08 DIAGNOSIS — R0602 Shortness of breath: Secondary | ICD-10-CM

## 2015-06-08 DIAGNOSIS — N184 Chronic kidney disease, stage 4 (severe): Secondary | ICD-10-CM | POA: Insufficient documentation

## 2015-06-08 DIAGNOSIS — I129 Hypertensive chronic kidney disease with stage 1 through stage 4 chronic kidney disease, or unspecified chronic kidney disease: Secondary | ICD-10-CM | POA: Insufficient documentation

## 2015-06-08 DIAGNOSIS — Z7951 Long term (current) use of inhaled steroids: Secondary | ICD-10-CM | POA: Diagnosis not present

## 2015-06-08 DIAGNOSIS — J40 Bronchitis, not specified as acute or chronic: Secondary | ICD-10-CM

## 2015-06-08 LAB — CBC
HEMATOCRIT: 33.5 % — AB (ref 35.0–47.0)
HEMOGLOBIN: 11 g/dL — AB (ref 12.0–16.0)
MCH: 29.8 pg (ref 26.0–34.0)
MCHC: 32.9 g/dL (ref 32.0–36.0)
MCV: 90.6 fL (ref 80.0–100.0)
Platelets: 228 10*3/uL (ref 150–440)
RBC: 3.7 MIL/uL — AB (ref 3.80–5.20)
RDW: 14.1 % (ref 11.5–14.5)
WBC: 7.8 10*3/uL (ref 3.6–11.0)

## 2015-06-08 LAB — BASIC METABOLIC PANEL
ANION GAP: 10 (ref 5–15)
BUN: 33 mg/dL — ABNORMAL HIGH (ref 6–20)
CHLORIDE: 104 mmol/L (ref 101–111)
CO2: 23 mmol/L (ref 22–32)
CREATININE: 1.53 mg/dL — AB (ref 0.44–1.00)
Calcium: 9 mg/dL (ref 8.9–10.3)
GFR calc non Af Amer: 30 mL/min — ABNORMAL LOW (ref >60)
GFR, EST AFRICAN AMERICAN: 35 mL/min — AB (ref >60)
Glucose, Bld: 102 mg/dL — ABNORMAL HIGH (ref 65–99)
POTASSIUM: 3.5 mmol/L (ref 3.5–5.1)
Sodium: 137 mmol/L (ref 135–145)

## 2015-06-08 LAB — TROPONIN I: Troponin I: 0.04 ng/mL — ABNORMAL HIGH (ref <0.031)

## 2015-06-08 MED ORDER — PREDNISONE 20 MG PO TABS: ORAL_TABLET | ORAL | Status: DC

## 2015-06-08 MED ORDER — ALBUTEROL SULFATE HFA 108 (90 BASE) MCG/ACT IN AERS: 2.0000 | INHALATION_SPRAY | Freq: Four times a day (QID) | RESPIRATORY_TRACT | Status: DC | PRN

## 2015-06-08 MED ORDER — OPTICHAMBER ADVANTAGE MISC: 1.0000 | Freq: Once | Status: AC

## 2015-06-08 MED ORDER — ALBUTEROL SULFATE (2.5 MG/3ML) 0.083% IN NEBU
1.2500 mg | INHALATION_SOLUTION | Freq: Once | RESPIRATORY_TRACT | Status: AC
  Administered 2015-06-08: 1.25 mg via RESPIRATORY_TRACT
  Filled 2015-06-08: qty 3

## 2015-06-08 NOTE — Telephone Encounter (Signed)
Patient Name: Savannah Wilson  DOB: [birth year]    Initial Comment Caller states she is having pain in her chest, goes down into her back.   Nurse Assessment  Nurse: Verlin Fester RN, [facility] Date/Time Eilene Ghazi Time): 06/08/2015 8:39:03 AM  Confirm and document reason for call. If symptomatic, describe symptoms. ---Patient states she is having pain in the left chest and into the back. Each breath hurts.  Has the patient traveled out of the country within the last 30 days? ---No  Does the patient have any new or worsening symptoms? ---Yes  Will a triage be completed? ---Yes  Related visit to physician within the last 2 weeks? ---Yes  Does the PT have any chronic conditions? (i.e. diabetes, asthma, etc.) ---Yes  List chronic conditions. ---"HTN, kidney disease"     Guidelines    Guideline Title Affirmed Question Affirmed Notes  Chest Pain [1] Chest pain lasts > 5 minutes AND [2] age > 39    Final Disposition User   Call EMS 911 Now Verlin Fester, RN, [facility]    Comments  After triage patient refused to call 911 states I am not that bad and request to see Dr. Derrel Nip .   Disagree/Comply: Disagree  Disagree/Comply Reason: Disagree with instructions

## 2015-06-08 NOTE — ED Notes (Signed)
Pain began on Friday. States it feels worse to lay down flat.

## 2015-06-08 NOTE — ED Notes (Signed)
Patient has had a cough since last week. Productive with yellow sputum.

## 2015-06-08 NOTE — Telephone Encounter (Signed)
Patient is going to ER

## 2015-06-08 NOTE — ED Provider Notes (Signed)
[facility] Emergency Department Provider Note  ____________________________________________  Time seen: 1210  I have reviewed the triage vital signs and the nursing notes.   HISTORY  Chief Complaint Chest Pain  shortness of breath    HPI Savannah Wilson is a 81 y.o. female who reports she has had a cough and some shortness of breath since Friday. She has had left-sided chest pain as well. She feels the discomfort has been getting worse. She had some trouble sleeping last night. She called her regular doctor's office, Dr. Derrel Nip, who advised her to come to the emergency department.     Past Medical History  Diagnosis Date  . Hypothyroid   . Hyperlipidemia   . Diverticulosis   . Peripheral neuralgia   . Hyperkalemia   . Hypertension   . Asthma   . Depression   . GERD (gastroesophageal reflux disease)   . Venous insufficiency   . Barrett esophagus   . Chronic bronchitis   . Diverticular disease   . Renal insufficiency   . Gout   . Pancreatitis due to biliary obstruction March 2010    s/p ERCP sphincterotomy, cholecystectomy    Patient Active Problem List   Diagnosis Date Noted  . Biceps muscle strain 01/29/2015  . Bilateral hand numbness 01/29/2015  . Urinary incontinence, mixed 01/29/2015  . Counseling regarding advanced directives and goals of care 01/29/2015  . Medicare annual wellness visit, subsequent 08/09/2014  . Obesity (BMI 30.0-34.9) 10/06/2013  . Hypothyroidism 10/06/2013  . Hiatal hernia with gastroesophageal reflux 09/24/2013  . Zenker's diverticulum 09/24/2013  . Anxiety state 07/05/2013  . Need for prophylactic vaccination and inoculation against influenza 05/07/2013  . Allergic rhinitis 01/11/2013  . Dyspnea on exertion 01/11/2013  . Bradycardia, drug induced 08/26/2012  . Renal cysts, acquired, bilateral 08/26/2012  . Anemia in chronic kidney disease (CKD) 06/24/2012  . Neuropathy ([place]) 05/22/2012  . History of  pancreatitis   . Hypertension 09/15/2011  . Systolic CHF, chronic ([place]) 11/06/2010  . Asthma   . Depression   . Venous insufficiency   . Barrett esophagus   . CKD (chronic kidney disease), stage IV [facility])     Past Surgical History  Procedure Laterality Date  . Ventral hernia repair    . Cataract extraction      right  . Cholecystectomy  02/10  . Vesicovaginal fistula closure w/ tah    . Tonsillectomy    . Ventral hernia repair  2007  . Cataract extraction  2008  . Cholecystectomy  2010  . Carpal tunnel release  jan 2013    [name]  . Abdominal hysterectomy      Current Outpatient Rx  Name  Route  Sig  Dispense  Refill  . acetaminophen (TYLENOL) 650 MG CR tablet   Oral   Take 650 mg by mouth every 8 (eight) hours as needed.         . ALPRAZolam (XANAX) 0.5 MG tablet      TAKE ONE TABLET BY MOUTH TWICE DAILY AS NEEDED   60 tablet   3   . Beclomethasone Dipropionate 80 MCG/ACT AERS   Nasal   Place 2 sprays into the nose daily.   8.7 g   11   . cyanocobalamin ([employer] VITAMIN B12) 2000 MCG tablet   Oral   Take 1 tablet (2,000 mcg total) by mouth daily.   30 tablet   1   . DiphenhydrAMINE HCl (BENADRYL ALLERGY PO)   Oral  Take 1 tablet by mouth at bedtime. Take by mouth as needed.         . DULERA 100-5 MCG/ACT AERO      USE 2 PUFFS TWICE DAILY   13 g   3   . fexofenadine (ALLEGRA) 180 MG tablet   Oral   Take 1 tablet (180 mg total) by mouth every morning.   30 tablet   2   . fluticasone (FLONASE) 50 MCG/ACT nasal spray   Nasal   Place 2 sprays into the nose daily.   16 g   6   . furosemide (LASIX) 40 MG tablet      TAKE ONE TABLET BY MOUTH EVERY DAY   30 tablet   5   . hydrALAZINE (APRESOLINE) 25 MG tablet   Oral   Take 1 tablet (25 mg total) by mouth 3 (three) times daily.   30 tablet   3   . hydrOXYzine (ATARAX/VISTARIL) 25 MG tablet   Oral   Take 1 tablet (25 mg total) by mouth 3 (three) times daily as needed.   30 tablet    0   . Infant Care Products (DERMACLOUD) CREA      Apply liberally to  Vaginal folds.   430 g   0   . levothyroxine (SYNTHROID, LEVOTHROID) 100 MCG tablet      TAKE ONE TABLET BY MOUTH EVERY DAY.   90 tablet   3   . loperamide (IMODIUM A-D) 2 MG tablet   Oral   Take 2 mg by mouth 4 (four) times daily as needed.           Marland Kitchen losartan (COZAAR) 100 MG tablet   Oral   Take 1 tablet (100 mg total) by mouth daily.   30 tablet   3   . metoprolol tartrate (LOPRESSOR) 25 MG tablet      TAKE ONE TABLET BY MOUTH TWICE DAILY   60 tablet   3   . omeprazole (PRILOSEC) 40 MG capsule      TAKE ONE CAPSULE BY MOUTH TWICE A DAY 30 MINUTES BEFORE MEALS.   60 capsule   5   . ondansetron (ZOFRAN) 4 MG tablet   Oral   Take 4 mg by mouth every 4 (four) hours as needed. For nausea and vomiting         . pantoprazole (PROTONIX) 40 MG tablet      TAKE 1 TABLET BY MOUTH TWICE DAILY BEFORE A MEAL   60 tablet   5   . pramipexole (MIRAPEX) 0.125 MG tablet   Oral   Take 1 tablet (0.125 mg total) by mouth 3 (three) times daily.   30 tablet   11   . predniSONE (DELTASONE) 20 MG tablet      Take 2 tablets the first day, then take one tablet a day for 4 more days.   6 tablet   0   . PROAIR HFA 108 (90 BASE) MCG/ACT inhaler      INHALE 2 PUFFS EVERY 6 HOURS AS NEEDED FOR WHEEZING   8.5 g   3   . sertraline (ZOLOFT) 50 MG tablet   Oral   Take 1 tablet (50 mg total) by mouth daily.   90 tablet   3   . VITAMIN D, ERGOCALCIFEROL, PO   Oral   Take 1.25 mg by mouth every 30 (thirty) days.           Allergies Ace inhibitors; Benicar; Clarithromycin; Clarithromycin;  Norvasc; Levofloxacin; and Zithromax  Family History  Problem Relation Age of Onset  . Kidney failure Mother   . Hypertension Mother   . Kidney disease Mother   . Multiple myeloma Daughter   . Cancer Daughter     multiple myeloma  . Kidney disease Daughter   . Heart disease Father   . Rheumatologic disease  Father   . Kidney cancer Father     Social History Social History  Substance Use Topics  . Smoking status: Never Smoker   . Smokeless tobacco: Never Used  . Alcohol Use: No     Comment: rare    Review of Systems  Constitutional: Negative for fever. ENT: Negative for sore throat. Cardiovascular: Positive for chest pain. Respiratory: Positive cough Gastrointestinal: Negative for abdominal pain, vomiting and diarrhea. Genitourinary: Negative for dysuria. Musculoskeletal: No myalgias or injuries. Skin: Negative for rash. Neurological: Negative for paresthesia or weakness   10-point ROS otherwise negative.  ____________________________________________   PHYSICAL EXAM:  VITAL SIGNS: ED Triage Vitals  Enc Vitals Group     BP 06/08/15 1123 178/60 mmHg     Pulse Rate 06/08/15 1123 49     Resp 06/08/15 1123 18     Temp 06/08/15 1123 97.8 F (36.6 C)     Temp Source 06/08/15 1123 Oral     SpO2 06/08/15 1123 100 %     Weight 06/08/15 1123 163 lb (73.936 kg)     Height 06/08/15 1123 5' (1.524 m)     Head Cir --      Peak Flow --      Pain Score 06/08/15 1124 0     Pain Loc --      Pain Edu? --      Excl. in [place]? --     Constitutional: Alert and oriented. Appears little distraught, slightly tearful, but overall appropriate.Marland Kitchen ENT   Head: Normocephalic and atraumatic.   Nose: No congestion/rhinnorhea.    Cardiovascular: Bradycardia at 55, regular rhythm, no murmur noted Respiratory:  Mild decreased breath sounds bilaterally. Minimal wheezing noted in upper airways..  Gastrointestinal: Soft and nontender. No distention.  Back: No muscle spasm, no tenderness, no CVA tenderness. Musculoskeletal: No deformity noted. Nontender with normal range of motion in all extremities. Minimal edema which is similar to before according to the visitors with the patient. Neurologic:  Normal speech and language. No gross focal neurologic deficits are appreciated.  Skin:  Skin is warm,  dry. No rash noted. Psychiatric: Mood and affect are normal. Speech and behavior are normal.  ____________________________________________    LABS (pertinent positives/negatives)  Labs Reviewed  BASIC METABOLIC PANEL - Abnormal; Notable for the following:    Glucose, Bld 102 (*)    BUN 33 (*)    Creatinine, Ser 1.53 (*)    GFR calc non Af Amer 30 (*)    GFR calc Af Amer 35 (*)    All other components within normal limits  CBC - Abnormal; Notable for the following:    RBC 3.70 (*)    Hemoglobin 11.0 (*)    HCT 33.5 (*)    All other components within normal limits  TROPONIN I - Abnormal; Notable for the following:    Troponin I 0.04 (*)    All other components within normal limits     ____________________________________________   EKG  ED ECG REPORT I, Wilferd Ritson W, the attending physician, personally viewed and interpreted this ECG.   Date: 06/08/2015  EKG Time: 1120  Rate: 48  Rhythm: Sinus bradycardia  Axis: Normal  Intervals: QT 520, QTC 465  ST&T Change: Minimal ST depression diffuse..   ____________________________________________    RADIOLOGY  Chest x-ray:  IMPRESSION: Small bibasilar pleural effusions.  Moderate-sized hiatal hernia.   ____________________________________________ ____________________________________________   INITIAL IMPRESSION / ASSESSMENT AND PLAN / ED COURSE  Pertinent labs & imaging results that were available during my care of the patient were reviewed by me and considered in my medical decision making (see chart for details).  Alert, pleasant, but a little bit tearful 81 year old female who has left-sided chest pain, cough, and mild shortness of breath. Chest x-ray and labs are pending. We'll treat her with a small dose of albuterol, 1.25 mg, and reevaluate.  ----------------------------------------- 2:35 PM on 06/08/2015 -----------------------------------------  Patient appears to be much more comfortable.  Auscultation finds much better air movement in her lungs. X-ray shows small bibasilar pleural effusions but no infiltrate. The patient's daughter is present for or conversation at this point. We discussed antibiotics and we both agree not to use them. I offered low-dose steroids which she agrees with. I have counseled her on the use of an inhaler with a spacer, which I will write prescriptions for.  ____________________________________________   FINAL CLINICAL IMPRESSION(S) / ED DIAGNOSES  Final diagnoses:  Bronchitis  Shortness of breath  Cough     Ahmed Prima, MD 06/08/15 1452

## 2015-06-08 NOTE — Discharge Instructions (Signed)
You may use her inhaler more often. Use 2-4 puffs every 4-6 hours as needed. We discussed the use of steroids and agreed on a low-dose steroid regimen. Take prednisone once a day as prescribed Follow-up with your regular doctor. Return to the emergency department if you have more shortness of breath or other urgent concerns.  Cough, Adult Coughing is a reflex that clears your throat and your airways. Coughing helps to heal and protect your lungs. It is normal to cough occasionally, but a cough that happens with other symptoms or lasts a long time may be a sign of a condition that needs treatment. A cough may last only 2-3 weeks (acute), or it may last longer than 8 weeks (chronic). CAUSES Coughing is commonly caused by:  Breathing in substances that irritate your lungs.  A viral or bacterial respiratory infection.  Allergies.  Asthma.  Postnasal drip.  Smoking.  Acid backing up from the stomach into the esophagus (gastroesophageal reflux).  Certain medicines.  Chronic lung problems, including COPD (or rarely, lung cancer).  Other medical conditions such as heart failure. HOME CARE INSTRUCTIONS  Pay attention to any changes in your symptoms. Take these actions to help with your discomfort:  Take medicines only as told by your health care provider.  If you were prescribed an antibiotic medicine, take it as told by your health care provider. Do not stop taking the antibiotic even if you start to feel better.  Talk with your health care provider before you take a cough suppressant medicine.  Drink enough fluid to keep your urine clear or pale yellow.  If the air is dry, use a cold steam vaporizer or humidifier in your bedroom or your home to help loosen secretions.  Avoid anything that causes you to cough at work or at home.  If your cough is worse at night, try sleeping in a semi-upright position.  Avoid cigarette smoke. If you smoke, quit smoking. If you need help quitting,  ask your health care provider.  Avoid caffeine.  Avoid alcohol.  Rest as needed. SEEK MEDICAL CARE IF:   You have new symptoms.  You cough up pus.  Your cough does not get better after 2-3 weeks, or your cough gets worse.  You cannot control your cough with suppressant medicines and you are losing sleep.  You develop pain that is getting worse or pain that is not controlled with pain medicines.  You have a fever.  You have unexplained weight loss.  You have night sweats. SEEK IMMEDIATE MEDICAL CARE IF:  You cough up blood.  You have difficulty breathing.  Your heartbeat is very fast.   This information is not intended to replace advice given to you by your health care provider. Make sure you discuss any questions you have with your health care provider.   Document Released: 01/21/2011 Document Revised: 04/15/2015 Document Reviewed: 10/01/2014 Elsevier Interactive Patient Education [employer].

## 2015-06-22 ENCOUNTER — Other Ambulatory Visit: Payer: Self-pay | Admitting: Internal Medicine

## 2015-06-22 NOTE — Telephone Encounter (Signed)
Alprazolam last refilled 10/24/14 for #60 with 3 refills. Ok to refill this medication?

## 2015-06-22 NOTE — Telephone Encounter (Signed)
Ok to refill,  Rx printed

## 2015-07-06 ENCOUNTER — Other Ambulatory Visit: Payer: Self-pay | Admitting: Internal Medicine

## 2015-07-22 ENCOUNTER — Ambulatory Visit (INDEPENDENT_AMBULATORY_CARE_PROVIDER_SITE_OTHER): Payer: Medicare Other | Admitting: Internal Medicine

## 2015-07-22 ENCOUNTER — Encounter: Payer: Self-pay | Admitting: Internal Medicine

## 2015-07-22 VITALS — BP 146/60 | HR 52 | Temp 97.5°F | Resp 12 | Ht 60.0 in | Wt 162.5 lb

## 2015-07-22 DIAGNOSIS — Z0000 Encounter for general adult medical examination without abnormal findings: Secondary | ICD-10-CM

## 2015-07-22 DIAGNOSIS — E669 Obesity, unspecified: Secondary | ICD-10-CM | POA: Diagnosis not present

## 2015-07-22 DIAGNOSIS — K2270 Barrett's esophagus without dysplasia: Secondary | ICD-10-CM | POA: Diagnosis not present

## 2015-07-22 DIAGNOSIS — F329 Major depressive disorder, single episode, unspecified: Secondary | ICD-10-CM | POA: Diagnosis not present

## 2015-07-22 DIAGNOSIS — F32A Depression, unspecified: Secondary | ICD-10-CM

## 2015-07-22 DIAGNOSIS — Z23 Encounter for immunization: Secondary | ICD-10-CM

## 2015-07-22 MED ORDER — HYDROXYZINE HCL 25 MG PO TABS: 25.0000 mg | ORAL_TABLET | Freq: Three times a day (TID) | ORAL | Status: DC | PRN

## 2015-07-22 NOTE — Patient Instructions (Addendum)
You should use a moisturizer without fragrance only.!!  Eucerin and , Aveeno are examples  Resume a  trial of hydroxyzine up to 3 or 4 daily for itching and for insomnia,  continue the alprazolam   I will have you get set up toMenopause is a normal process in which your reproductive ability comes to an end. This process happens gradually over a span of months to years, usually between the ages of 38 and 70. Menopause is complete when you have missed 12 consecutive menstrual periods. It is important to talk with your health care provider about some of the most common conditions that affect postmenopausal women, such as heart disease, cancer, and bone loss (osteoporosis). Adopting a healthy lifestyle and getting preventive care can help to promote your health and wellness. Those actions can also lower your chances of developing some of these common conditions. WHAT SHOULD I KNOW ABOUT MENOPAUSE? During menopause, you may experience a number of symptoms, such as:  Moderate-to-severe hot flashes.  Night sweats.  Decrease in sex drive.  Mood swings.  Headaches.  Tiredness.  Irritability.  Memory problems.  Insomnia. Choosing to treat or not to treat menopausal changes is an individual decision that you make with your health care provider. WHAT SHOULD I KNOW ABOUT HORMONE REPLACEMENT THERAPY AND SUPPLEMENTS? Hormone therapy products are effective for treating symptoms that are associated with menopause, such as hot flashes and night sweats. Hormone replacement carries certain risks, especially as you become older. If you are thinking about using estrogen or estrogen with progestin treatments, discuss the benefits and risks with your health care provider. WHAT SHOULD I KNOW ABOUT HEART DISEASE AND STROKE? Heart disease, heart attack, and stroke become more likely as you age. This may be due, in part, to the hormonal changes that your body experiences during menopause. These can affect how your  body processes dietary fats, triglycerides, and cholesterol. Heart attack and stroke are both medical emergencies. There are many things that you can do to help prevent heart disease and stroke:  Have your blood pressure checked at least every 1-2 years. High blood pressure causes heart disease and increases the risk of stroke.  If you are 70-66 years old, ask your health care provider if you should take aspirin to prevent a heart attack or a stroke.  Do not use any tobacco products, including cigarettes, chewing tobacco, or electronic cigarettes. If you need help quitting, ask your health care provider.  It is important to eat a healthy diet and maintain a healthy weight.  Be sure to include plenty of vegetables, fruits, low-fat dairy products, and lean protein.  Avoid eating foods that are high in solid fats, added sugars, or salt (sodium).  Get regular exercise. This is one of the most important things that you can do for your health.  Try to exercise for at least 150 minutes each week. The type of exercise that you do should increase your heart rate and make you sweat. This is known as moderate-intensity exercise.  Try to do strengthening exercises at least twice each week. Do these in addition to the moderate-intensity exercise.  Know your numbers.Ask your health care provider to check your cholesterol and your blood glucose. Continue to have your blood tested as directed by your health care provider. WHAT SHOULD I KNOW ABOUT CANCER SCREENING? There are several types of cancer. Take the following steps to reduce your risk and to catch any cancer development as early as possible. Breast Cancer  Practice  breast self-awareness.  This means understanding how your breasts normally appear and feel.  It also means doing regular breast self-exams. Let your health care provider know about any changes, no matter how small.  If you are 78 or older, have a clinician do a breast exam  (clinical breast exam or CBE) every year. Depending on your age, family history, and medical history, it may be recommended that you also have a yearly breast X-ray (mammogram).  If you have a family history of breast cancer, talk with your health care provider about genetic screening.  If you are at high risk for breast cancer, talk with your health care provider about having an MRI and a mammogram every year.  Breast cancer (BRCA) gene test is recommended for women who have family members with BRCA-related cancers. Results of the assessment will determine the need for genetic counseling and BRCA1 and for BRCA2 testing. BRCA-related cancers include these types:  Breast. This occurs in males or females.  Ovarian.  Tubal. This may also be called fallopian tube cancer.  Cancer of the abdominal or pelvic lining (peritoneal cancer).  Prostate.  Pancreatic. Cervical, Uterine, and Ovarian Cancer Your health care provider may recommend that you be screened regularly for cancer of the pelvic organs. These include your ovaries, uterus, and vagina. This screening involves a pelvic exam, which includes checking for microscopic changes to the surface of your cervix (Pap test).  For women ages 21-65, health care providers may recommend a pelvic exam and a Pap test every three years. For women ages 42-65, they may recommend the Pap test and pelvic exam, combined with testing for human papilloma virus (HPV), every five years. Some types of HPV increase your risk of cervical cancer. Testing for HPV may also be done on women of any age who have unclear Pap test results.  Other health care providers may not recommend any screening for nonpregnant women who are considered low risk for pelvic cancer and have no symptoms. Ask your health care provider if a screening pelvic exam is right for you.  If you have had past treatment for cervical cancer or a condition that could lead to cancer, you need Pap tests and  screening for cancer for at least 20 years after your treatment. If Pap tests have been discontinued for you, your risk factors (such as having a new sexual partner) need to be reassessed to determine if you should start having screenings again. Some women have medical problems that increase the chance of getting cervical cancer. In these cases, your health care provider may recommend that you have screening and Pap tests more often.  If you have a family history of uterine cancer or ovarian cancer, talk with your health care provider about genetic screening.  If you have vaginal bleeding after reaching menopause, tell your health care provider.  There are currently no reliable tests available to screen for ovarian cancer. Lung Cancer Lung cancer screening is recommended for adults 88-65 years old who are at high risk for lung cancer because of a history of smoking. A yearly low-dose CT scan of the lungs is recommended if you:  Currently smoke.  Have a history of at least 30 pack-years of smoking and you currently smoke or have quit within the past 15 years. A pack-year is smoking an average of one pack of cigarettes per day for one year. Yearly screening should:  Continue until it has been 15 years since you quit.  Stop if you develop  a health problem that would prevent you from having lung cancer treatment. Colorectal Cancer  This type of cancer can be detected and can often be prevented.  Routine colorectal cancer screening usually begins at age 62 and continues through age 81.  If you have risk factors for colon cancer, your health care provider may recommend that you be screened at an earlier age.  If you have a family history of colorectal cancer, talk with your health care provider about genetic screening.  Your health care provider may also recommend using home test kits to check for hidden blood in your stool.  A small camera at the end of a tube can be used to examine your  colon directly (sigmoidoscopy or colonoscopy). This is done to check for the earliest forms of colorectal cancer.  Direct examination of the colon should be repeated every 5-10 years until age 50. However, if early forms of precancerous polyps or small growths are found or if you have a family history or genetic risk for colorectal cancer, you may need to be screened more often. Skin Cancer  Check your skin from head to toe regularly.  Monitor any moles. Be sure to tell your health care provider:  About any new moles or changes in moles, especially if there is a change in a mole's shape or color.  If you have a mole that is larger than the size of a pencil eraser.  If any of your family members has a history of skin cancer, especially at a young age, talk with your health care provider about genetic screening.  Always use sunscreen. Apply sunscreen liberally and repeatedly throughout the day.  Whenever you are outside, protect yourself by wearing long sleeves, pants, a wide-brimmed hat, and sunglasses. WHAT SHOULD I KNOW ABOUT OSTEOPOROSIS? Osteoporosis is a condition in which bone destruction happens more quickly than new bone creation. After menopause, you may be at an increased risk for osteoporosis. To help prevent osteoporosis or the bone fractures that can happen because of osteoporosis, the following is recommended:  If you are 78-62 years old, get at least 1,000 mg of calcium and at least 600 mg of vitamin D per day.  If you are older than age 92 but younger than age 75, get at least 1,200 mg of calcium and at least 600 mg of vitamin D per day.  If you are older than age 47, get at least 1,200 mg of calcium and at least 800 mg of vitamin D per day. Smoking and excessive alcohol intake increase the risk of osteoporosis. Eat foods that are rich in calcium and vitamin D, and do weight-bearing exercises several times each week as directed by your health care provider. WHAT SHOULD I  KNOW ABOUT HOW MENOPAUSE AFFECTS [place]? Depression may occur at any age, but it is more common as you become older. Common symptoms of depression include:  Low or sad mood.  Changes in sleep patterns.  Changes in appetite or eating patterns.  Feeling an overall lack of motivation or enjoyment of activities that you previously enjoyed.  Frequent crying spells. Talk with your health care provider if you think that you are experiencing depression. WHAT SHOULD I KNOW ABOUT IMMUNIZATIONS? It is important that you get and maintain your immunizations. These include:  Tetanus, diphtheria, and pertussis (Tdap) booster vaccine.  Influenza every year before the flu season begins.  Pneumonia vaccine.  Shingles vaccine. Your health care provider may also recommend other immunizations.  This information is not intended to replace advice given to you by your health care provider. Make sure you discuss any questions you have with your health care provider.   Document Released: 09/16/2005 Document Revised: 08/15/2014 Document Reviewed: 03/27/2014 Elsevier Interactive Patient Education [employer].  have a wellness visit with our health coach Denisa

## 2015-07-22 NOTE — Progress Notes (Signed)
Pre-visit discussion using our clinic review tool. No additional management support is needed unless otherwise documented below in the visit note.  

## 2015-07-22 NOTE — Progress Notes (Signed)
Patient ID: Savannah Wilson, female    DOB: Nov 27, 1928  Age: 81 y.o. MRN: [id number]  The patient is here for annual  wellness examination and management of other chronic and acute problems.    Last mammogram at age 23 (July 2012) TAH remote EGD 2015 [facility]   The risk factors are reflected in the social history.  The roster of all physicians providing medical care to patient - is listed in the Snapshot section of the chart.  Activities of daily living:  The patient is 100% independent in all ADLs: dressing, toileting, feeding as well as independent mobility  Home safety : The patient has smoke detectors in the home. They wear seatbelts.  There are no firearms at home. There is no violence in the home.   There is no risks for hepatitis, STDs or HIV. There is no   history of blood transfusion. They have no travel history to infectious disease endemic areas of the world.  The patient has seen their dentist in the last six month. They have seen their eye doctor in the last year. They admit to slight hearing difficulty with regard to whispered voices and some television programs.  They have deferred audiologic testing in the last year.  They do not  have excessive sun exposure. Discussed the need for sun protection: hats, long sleeves and use of sunscreen if there is significant sun exposure.   Diet: the importance of a healthy diet is discussed. They do have a healthy diet.  The benefits of regular aerobic exercise were discussed. She walks 4 times per week ,  20 minutes.   Depression screen: there are no signs or vegative symptoms of depression- irritability, change in appetite, anhedonia, sadness/tearfullness.  Cognitive assessment: the patient manages all their financial and personal affairs and is actively engaged. They could relate day,date,year and events; recalled 2/3 objects at 3 minutes; performed clock-face test normally.  The following portions of the patient's history were  reviewed and updated as appropriate: allergies, current medications, past family history, past medical history,  past surgical history, past social history  and problem list.  Visual acuity was not assessed per patient preference since she has regular follow up with her ophthalmologist. Hearing and body mass index were assessed and reviewed.   During the course of the visit the patient was educated and counseled about appropriate screening and preventive services including : fall prevention , diabetes screening, nutrition counseling, colorectal cancer screening, and recommended immunizations.    CC: The primary encounter diagnosis was Need for prophylactic vaccination against Streptococcus pneumoniae (pneumococcus). Diagnoses of Routine general medical examination at a health care facility, Obesity (BMI 30.0-34.9), Barrett esophagus, and Depression were also pertinent to this visit.  She has been experiencing persistent fatigue.  The symptom is constant and stable.  It occurs daily by mid morning.  Not accompanied by dyspnea or chest pain.  It is not alleviated by resting.    She has a history of Insomnia until 3 am on the average of 3 times per week,  She has had to use alprazolam 0.5 mg every night.    History Kimberlyann has a past medical history of Hypothyroid; Hyperlipidemia; Diverticulosis; Peripheral neuralgia; Hyperkalemia; Hypertension; Asthma; Depression; GERD (gastroesophageal reflux disease); Venous insufficiency; Barrett esophagus; Chronic bronchitis; Diverticular disease; Renal insufficiency; Gout; and Pancreatitis due to biliary obstruction (March 2010).   She has past surgical history that includes Ventral hernia repair; Cataract extraction; Cholecystectomy (02/10); Vesicovaginal fistula closure w/ TAH; Tonsillectomy; Ventral hernia  repair (2007); Cataract extraction (2008); Cholecystectomy (2010); Carpal tunnel release (jan 2013); and Abdominal hysterectomy.   Her family history includes  Cancer in her daughter; Heart disease in her father; Hypertension in her mother; Kidney cancer in her father; Kidney disease in her daughter and mother; Kidney failure in her mother; Multiple myeloma in her daughter; Rheumatologic disease in her father.She reports that she has never smoked. She has never used smokeless tobacco. She reports that she does not drink alcohol or use illicit drugs.  Outpatient Prescriptions Prior to Visit  Medication Sig Dispense Refill  . acetaminophen (TYLENOL) 650 MG CR tablet Take 650 mg by mouth every 8 (eight) hours as needed.    Marland Kitchen albuterol (PROVENTIL HFA;VENTOLIN HFA) 108 (90 BASE) MCG/ACT inhaler Inhale 2 puffs into the lungs every 6 (six) hours as needed for wheezing or shortness of breath. 1 Inhaler 2  . ALPRAZolam (XANAX) 0.5 MG tablet TAKE ONE TABLET BY MOUTH TWICE A DAY AS NEEDED. 60 tablet 2  . Beclomethasone Dipropionate 80 MCG/ACT AERS Place 2 sprays into the nose daily. 8.7 g 11  . cyanocobalamin ([facility] VITAMIN B12) 2000 MCG tablet Take 1 tablet (2,000 mcg total) by mouth daily. 30 tablet 1  . DiphenhydrAMINE HCl (BENADRYL ALLERGY PO) Take 1 tablet by mouth at bedtime. Take by mouth as needed.    . DULERA 100-5 MCG/ACT AERO USE 2 PUFFS TWICE DAILY 13 g 3  . fexofenadine (ALLEGRA) 180 MG tablet Take 1 tablet (180 mg total) by mouth every morning. 30 tablet 2  . fluticasone (FLONASE) 50 MCG/ACT nasal spray Place 2 sprays into the nose daily. 16 g 6  . furosemide (LASIX) 40 MG tablet TAKE ONE TABLET BY MOUTH EVERY DAY 30 tablet 5  . hydrALAZINE (APRESOLINE) 25 MG tablet Take 1 tablet (25 mg total) by mouth 3 (three) times daily. 30 tablet 3  . hydrOXYzine (ATARAX/VISTARIL) 25 MG tablet Take 1 tablet (25 mg total) by mouth 3 (three) times daily as needed. 30 tablet 0  . Infant Care Products (DERMACLOUD) CREA Apply liberally to  Vaginal folds. 430 g 0  . levothyroxine (SYNTHROID, LEVOTHROID) 100 MCG tablet TAKE ONE TABLET BY MOUTH EVERY DAY. 90 tablet 3  .  loperamide (IMODIUM A-D) 2 MG tablet Take 2 mg by mouth 4 (four) times daily as needed.      Marland Kitchen losartan (COZAAR) 100 MG tablet Take 1 tablet (100 mg total) by mouth daily. 30 tablet 3  . metoprolol tartrate (LOPRESSOR) 25 MG tablet TAKE ONE TABLET BY MOUTH TWICE DAILY 60 tablet 3  . omeprazole (PRILOSEC) 40 MG capsule TAKE ONE CAPSULE TWICE DAILY BY MOUTH BEFORE BREAKFAST AND SUPPER. 60 capsule 2  . ondansetron (ZOFRAN) 4 MG tablet Take 4 mg by mouth every 4 (four) hours as needed. For nausea and vomiting    . pantoprazole (PROTONIX) 40 MG tablet TAKE 1 TABLET BY MOUTH TWICE DAILY BEFORE A MEAL 60 tablet 5  . pramipexole (MIRAPEX) 0.125 MG tablet Take 1 tablet (0.125 mg total) by mouth 3 (three) times daily. 30 tablet 11  . predniSONE (DELTASONE) 20 MG tablet Take 2 tablets the first day, then take one tablet a day for 4 more days. 6 tablet 0  . PROAIR HFA 108 (90 BASE) MCG/ACT inhaler INHALE 2 PUFFS EVERY 6 HOURS AS NEEDED FOR WHEEZING 8.5 g 3  . sertraline (ZOLOFT) 50 MG tablet Take 1 tablet (50 mg total) by mouth daily. 90 tablet 3  . Spacer/Aero-Holding Chambers (OPTICHAMBER ADVANTAGE) MISC  1 by mouth  1 each 0  . VITAMIN D, ERGOCALCIFEROL, PO Take 1.25 mg by mouth every 30 (thirty) days.     Facility-Administered Medications Prior to Visit  Medication Dose Route Frequency Provider Last Rate Last Dose  . albuterol (PROVENTIL) (2.5 MG/3ML) 0.083% nebulizer solution 2.5 mg  2.5 mg Nebulization Once [name], MD        Review of Systems   Patient denies headache, fevers, malaise, unintentional weight loss, skin rash, eye pain, sinus congestion and sinus pain, sore throat, dysphagia,  hemoptysis , cough, dyspnea, wheezing, chest pain, palpitations, orthopnea, edema, abdominal pain, nausea, melena, diarrhea, constipation, flank pain, dysuria, hematuria, urinary  Frequency, nocturia, numbness, tingling, seizures,  Focal weakness, Loss of consciousness,  Tremor, insomnia, depression, anxiety,  and suicidal ideation.      Objective:  BP 146/60 mmHg  Pulse 52  Temp(Src) 97.5 F (36.4 C) (Oral)  Resp 12  Ht 5' (1.524 m)  Wt 162 lb 8 oz (73.71 kg)  BMI 31.74 kg/m2  SpO2 98%  Physical Exam   General appearance: alert, cooperative and appears stated age Head: Normocephalic, without obvious abnormality, atraumatic Eyes: conjunctivae/corneas clear. PERRL, EOM's intact. Fundi benign. Ears: normal TM's and external ear canals both ears Nose: Nares normal. Septum midline. Mucosa normal. No drainage or sinus tenderness. Throat: lips, mucosa, and tongue normal; teeth and gums normal Neck: no adenopathy, no carotid bruit, no JVD, supple, symmetrical, trachea midline and thyroid not enlarged, symmetric, no tenderness/mass/nodules Lungs: clear to auscultation bilaterally Breasts: normal appearance, no masses or tenderness Heart: regular rate and rhythm, S1, S2 normal, no murmur, click, rub or gallop Abdomen: soft, non-tender; bowel sounds normal; no masses,  no organomegaly Extremities: extremities normal, atraumatic, no cyanosis or edema Pulses: 2+ and symmetric Skin: Skin color, texture, turgor normal. No rashes or lesions Neurologic: Alert and oriented X 3, normal strength and tone. Normal symmetric reflexes. Normal coordination and gait.     Assessment & Plan:   Problem List Items Addressed This Visit    Depression    Aggravated  by daughter Pam's declining health, with frequent insomnia,  Increased tearfulness and frequent medical evaluations noted. She has not resumed sertraline, bu is using the alprazolam I have her for insomnia and prn once daily .            Relevant Medications   hydrOXYzine (ATARAX/VISTARIL) 25 MG tablet   Barrett esophagus    Persistent changes noted on repeat EGD  done by [facility] 09/13/2013 Needs to continue twice daily dosing of protonix   PA needed for bid dosing  Has breakthrough symptoms with once daily dosing      Obesity (BMI  30.0-34.9)    I have addressed  BMI and recommended wt loss of 10% of body weight over the next 6 months using a low glycemic index diet and regular exercise a minimum of 5 days per week.        Routine general medical examination at a health care facility    Annual comprehensive preventive exam was done as well as an evaluation and management of chronic conditions .  During the course of the visit the patient was educated and counseled about appropriate screening and preventive services including :  diabetes screening, lipid analysis with projected  10 year  risk for CAD   nutrition counseling, and recommended immunizations.  Printed recommendations for health maintenance screenings was given.        Other Visit Diagnoses  Need for prophylactic vaccination against Streptococcus pneumoniae (pneumococcus)    -  Primary    Relevant Orders    Pneumococcal polysaccharide vaccine 23-valent greater than or equal to 2yo subcutaneous/IM (Completed)       I have changed Ms. Hosman's hydrOXYzine. I am also having her maintain her loperamide, acetaminophen, DiphenhydrAMINE HCl (BENADRYL ALLERGY PO), losartan, hydrALAZINE, sertraline, ondansetron, cyanocobalamin, Beclomethasone Dipropionate, fluticasone, pramipexole, (VITAMIN D, ERGOCALCIFEROL, PO), pantoprazole, fexofenadine, levothyroxine, DERMACLOUD, DULERA, PROAIR HFA, predniSONE, albuterol, OPTICHAMBER ADVANTAGE, ALPRAZolam, and omeprazole. We will continue to administer albuterol.  Meds ordered this encounter  Medications  . hydrOXYzine (ATARAX/VISTARIL) 25 MG tablet    Sig: Take 1 tablet (25 mg total) by mouth 3 (three) times daily as needed for anxiety or itching.    Dispense:  90 tablet    Refill:  1    Medications Discontinued During This Encounter  Medication Reason  . hydrOXYzine (ATARAX/VISTARIL) 25 MG tablet Reorder    Follow-up: No Follow-up on file.   [name], MD

## 2015-07-24 ENCOUNTER — Other Ambulatory Visit: Payer: Self-pay | Admitting: Internal Medicine

## 2015-07-25 ENCOUNTER — Encounter: Payer: Self-pay | Admitting: Internal Medicine

## 2015-07-25 DIAGNOSIS — Z0000 Encounter for general adult medical examination without abnormal findings: Secondary | ICD-10-CM | POA: Insufficient documentation

## 2015-07-25 NOTE — Assessment & Plan Note (Signed)
I have addressed  BMI and recommended wt loss of 10% of body weight over the next 6 months using a low glycemic index diet and regular exercise a minimum of 5 days per week.   

## 2015-07-25 NOTE — Assessment & Plan Note (Addendum)
Aggravated  by daughter Pam's declining health, with frequent insomnia,  Increased tearfulness and frequent medical evaluations noted. She has not resumed sertraline, bu is using the alprazolam I have her for insomnia and prn once daily .

## 2015-07-25 NOTE — Assessment & Plan Note (Signed)
Annual comprehensive preventive exam was done as well as an evaluation and management of chronic conditions .  During the course of the visit the patient was educated and counseled about appropriate screening and preventive services including :  diabetes screening, lipid analysis with projected  10 year  risk for CAD   nutrition counseling, and recommended immunizations.  Printed recommendations for health maintenance screenings was given.

## 2015-07-25 NOTE — Assessment & Plan Note (Signed)
Persistent changes noted on repeat EGD  done by [facility] 09/13/2013 Needs to continue twice daily dosing of protonix   PA needed for bid dosing  Has breakthrough symptoms with once daily dosing

## 2015-10-12 ENCOUNTER — Other Ambulatory Visit: Payer: Self-pay | Admitting: Internal Medicine

## 2015-12-06 ENCOUNTER — Other Ambulatory Visit: Payer: Self-pay | Admitting: Internal Medicine

## 2015-12-06 DIAGNOSIS — T783XXA Angioneurotic edema, initial encounter: Secondary | ICD-10-CM | POA: Insufficient documentation

## 2015-12-06 MED ORDER — PREDNISONE 20 MG PO TABS: ORAL_TABLET | ORAL | Status: DC

## 2015-12-11 ENCOUNTER — Telehealth: Payer: Self-pay | Admitting: Internal Medicine

## 2015-12-11 ENCOUNTER — Other Ambulatory Visit: Payer: Self-pay | Admitting: Internal Medicine

## 2015-12-11 ENCOUNTER — Encounter: Payer: Self-pay | Admitting: Family Medicine

## 2015-12-11 ENCOUNTER — Ambulatory Visit (INDEPENDENT_AMBULATORY_CARE_PROVIDER_SITE_OTHER): Payer: Medicare Other | Admitting: Family Medicine

## 2015-12-11 VITALS — BP 146/74 | HR 60 | Temp 97.9°F | Ht 60.0 in | Wt 161.2 lb

## 2015-12-11 DIAGNOSIS — B370 Candidal stomatitis: Secondary | ICD-10-CM | POA: Diagnosis not present

## 2015-12-11 MED ORDER — CLOTRIMAZOLE 10 MG MT TROC: 10.0000 mg | Freq: Every day | OROMUCOSAL | Status: DC

## 2015-12-11 NOTE — Telephone Encounter (Signed)
Have scheduled with [place].

## 2015-12-11 NOTE — Progress Notes (Signed)
Pre visit review using our clinic review tool, if applicable. No additional management support is needed unless otherwise documented below in the visit note. 

## 2015-12-11 NOTE — Progress Notes (Signed)
Patient ID: Savannah Wilson, female   DOB: Apr 16, 1929, 82 y.o.   MRN: [id number]  Tommi Rumps, MD Phone: [phone]  Savannah Wilson is a 82 y.o. female who presents today for same-day visit.  Patient notes she had developed a rash about a week ago. She notified her PCP of this and had prednisone sent in this past Monday. She's been taking this and the rash is gone. She notes she developed white plaques on her tongue and inner mouth on Thursday with some burning discomfort. Notes it is some better today. No fevers. Swelling easily. She feels well overall. No rash on her skin at this time. She reports she has had thrush previously and this feels similar. No chest pain or trouble breathing.  PMH: nonsmoker.   ROS see history of present illness  Objective  Physical Exam Filed Vitals:   12/11/15 1528  BP: 146/74  Pulse: 60  Temp: 97.9 F (36.6 C)    BP Readings from Last 3 Encounters:  12/11/15 146/74  07/22/15 146/60  06/08/15 171/66   Wt Readings from Last 3 Encounters:  12/11/15 161 lb 3.2 oz (73.12 kg)  07/22/15 162 lb 8 oz (73.71 kg)  06/08/15 163 lb (73.936 kg)    Physical Exam  Constitutional: She is well-developed, well-nourished, and in no distress.  HENT:  Head: Normocephalic and atraumatic.  Right Ear: External ear normal.  Left Ear: External ear normal.  Small white plaques on her tongue, similar lesions on her oral mucosa and palate, also with mildly erythematous lesions on her palate  Eyes: Conjunctivae are normal. Pupils are equal, round, and reactive to light.  Cardiovascular: Normal rate, regular rhythm and normal heart sounds.   Pulmonary/Chest: Effort normal and breath sounds normal.  Skin: No rash noted. She is not diaphoretic.     Assessment/Plan: Please see individual problem list.  Thrush White plaques on tongue and oral mucosa developing after starting prednisone most consistent with thrush. No evidence of rash on skin. We will treat with  clotrimazole troche. She'll continue to monitor. If any change or worsening she will seek medical attention. Given return precautions.    No orders of the defined types were placed in this encounter.    Meds ordered this encounter  Medications  . clotrimazole (MYCELEX) 10 MG troche    Sig: Take 1 tablet (10 mg total) by mouth 5 (five) times daily.    Dispense:  70 tablet    Refill:  0    Tommi Rumps, MD [place]

## 2015-12-11 NOTE — Assessment & Plan Note (Signed)
White plaques on tongue and oral mucosa developing after starting prednisone most consistent with thrush. No evidence of rash on skin. We will treat with clotrimazole troche. She'll continue to monitor. If any change or worsening she will seek medical attention. Given return precautions.

## 2015-12-11 NOTE — Telephone Encounter (Signed)
Patient stated that her tongue is white with bumps patient started prednisone on 12/07/15 prescribed by Dr. Derrel Nip  Scheduled at 3.30 FYI

## 2015-12-11 NOTE — Telephone Encounter (Signed)
Patient was prescribed the prednisone on 4/30, now has white sores in mouth, I have attempted to call her and all I get is a busy signal.  Please advise?

## 2015-12-11 NOTE — Telephone Encounter (Signed)
Pt stated that she has white sores/rash in and round her mouth..Pt is currently taking predniSONE (DELTASONE) 20 MG tablet and only has today and tomorrow left. Please advise pt.Savannah Wilson

## 2015-12-11 NOTE — Patient Instructions (Addendum)
Nice to meet you. You. I have thrush likely related to her prednisone. We will treat this with miconazole. If you develop fevers, trouble swallowing, inability to take in liquids, or any new or changing symptoms please seek medical attention.

## 2015-12-17 ENCOUNTER — Other Ambulatory Visit: Payer: Self-pay | Admitting: Internal Medicine

## 2015-12-18 ENCOUNTER — Ambulatory Visit (INDEPENDENT_AMBULATORY_CARE_PROVIDER_SITE_OTHER): Payer: Medicare Other | Admitting: Family Medicine

## 2015-12-18 ENCOUNTER — Encounter: Payer: Self-pay | Admitting: Family Medicine

## 2015-12-18 VITALS — BP 140/60 | HR 56 | Temp 97.7°F | Ht 60.0 in | Wt 161.0 lb

## 2015-12-18 DIAGNOSIS — B370 Candidal stomatitis: Secondary | ICD-10-CM

## 2015-12-18 MED ORDER — NYSTATIN 100000 UNIT/ML MT SUSP: 5.0000 mL | Freq: Four times a day (QID) | OROMUCOSAL | Status: DC

## 2015-12-18 NOTE — Progress Notes (Signed)
Patient ID: Savannah Wilson, female   DOB: Oct 10, 1928, 82 y.o.   MRN: [id number]  Tommi Rumps, MD Phone: [phone]  Savannah Wilson is a 82 y.o. female who presents today for follow-up.  Patient was seen earlier this week for thrush. She was started on clotrimazole troches. She notes the thrush is not as bad as it was though she had some nausea and stomach upset with the medication and had to decrease her use of it. She took 5 tablets on Monday, 3 tablets on Tuesday and only one tablet yesterday. She noted some mild diarrhea with this as well. No blood in her stool. No vomiting. She notes no discomfort in her stomach over the last several days since decreasing use of the medication. She has no trouble swallowing. No fevers. She feels well overall.   ROS see history of present illness  Objective  Physical Exam Filed Vitals:   12/18/15 1500  BP: 140/60  Pulse: 56  Temp: 97.7 F (36.5 C)    BP Readings from Last 3 Encounters:  12/18/15 140/60  12/11/15 146/74  07/22/15 146/60   Wt Readings from Last 3 Encounters:  12/18/15 161 lb (73.029 kg)  12/11/15 161 lb 3.2 oz (73.12 kg)  07/22/15 162 lb 8 oz (73.71 kg)    Physical Exam  Constitutional: She is well-developed, well-nourished, and in no distress.  HENT:  Head: Normocephalic and atraumatic.  Right Ear: External ear normal.  Left Ear: External ear normal.  White patch on the right aspect of her tongue, oropharynx with no erythema or white exudate  Eyes: Conjunctivae are normal. Pupils are equal, round, and reactive to light.  Neck: Neck supple.  Cardiovascular: Normal rate, regular rhythm and normal heart sounds.   Pulmonary/Chest: Effort normal and breath sounds normal.  Abdominal: Soft. Bowel sounds are normal. She exhibits no distension. There is no tenderness. There is no rebound and no guarding.  Lymphadenopathy:    She has no cervical adenopathy.  Neurological: She is alert. Gait normal.  Skin: Skin is warm and  dry. She is not diaphoretic.     Assessment/Plan: Please see individual problem list.  Thrush Patient with thrush following use of prednisone. Somewhat improved. Still has a white patch on the right aspect of her tongue though her oropharynx is improved. She was unable to tolerate the initial medication and has not been taking this. Suspect the nausea and stomach discomfort with diarrhea was related to medication as it resolved after stopping the medication. I discussed potential options for treating. Discussed fluconazole and nystatin. Patient opted for nystatin. This was sent to her pharmacy. She's given return precautions.    No orders of the defined types were placed in this encounter.    Meds ordered this encounter  Medications  . nystatin (MYCOSTATIN) 100000 UNIT/ML suspension    Sig: Take 5 mLs (500,000 Units total) by mouth 4 (four) times daily.    Dispense:  180 mL    Refill:  0    Tommi Rumps, MD [place]

## 2015-12-18 NOTE — Patient Instructions (Addendum)
Nice to see you. We will change due to nystatin swish and swallow for your thrush. If this causes upset stomach please let us know. If you develop fever, worsening thrush, trouble swallowing, abdominal pain, nausea, vomiting, diarrhea, blood in her stool, or any new or changing symptoms please seek medical attention.

## 2015-12-18 NOTE — Assessment & Plan Note (Signed)
Patient with thrush following use of prednisone. Somewhat improved. Still has a white patch on the right aspect of her tongue though her oropharynx is improved. She was unable to tolerate the initial medication and has not been taking this. Suspect the nausea and stomach discomfort with diarrhea was related to medication as it resolved after stopping the medication. I discussed potential options for treating. Discussed fluconazole and nystatin. Patient opted for nystatin. This was sent to her pharmacy. She's given return precautions.

## 2015-12-23 ENCOUNTER — Telehealth: Payer: Self-pay | Admitting: Internal Medicine

## 2015-12-23 NOTE — Telephone Encounter (Signed)
Pt called back about still having Thrush in her mouth. Pt has used all of the mouth wash medication. Pharmacy is [facility], [place]. No appt avail to sch pt wants to come in before she goes on vacation next Saturday. Call pt @ [phone]. Thank you!

## 2015-12-23 NOTE — Telephone Encounter (Signed)
Spoke to patient. Offered f/u appt with providers that have openings this week and next week. Patient refused. Patient states she will finish the mouth wash prescribed, then call back if thrush has not improved.

## 2015-12-24 ENCOUNTER — Encounter: Payer: Self-pay | Admitting: Family Medicine

## 2015-12-24 ENCOUNTER — Ambulatory Visit (INDEPENDENT_AMBULATORY_CARE_PROVIDER_SITE_OTHER): Payer: Medicare Other | Admitting: Family Medicine

## 2015-12-24 ENCOUNTER — Telehealth: Payer: Self-pay | Admitting: *Deleted

## 2015-12-24 VITALS — BP 136/72 | HR 53 | Temp 98.2°F | Ht 60.0 in | Wt 159.4 lb

## 2015-12-24 DIAGNOSIS — B370 Candidal stomatitis: Secondary | ICD-10-CM

## 2015-12-24 LAB — CBC WITH DIFFERENTIAL/PLATELET
BASOS PCT: 0.2 % (ref 0.0–3.0)
Basophils Absolute: 0 10*3/uL (ref 0.0–0.1)
EOS PCT: 1.4 % (ref 0.0–5.0)
Eosinophils Absolute: 0.1 10*3/uL (ref 0.0–0.7)
HCT: 30.9 % — ABNORMAL LOW (ref 36.0–46.0)
HEMOGLOBIN: 10.2 g/dL — AB (ref 12.0–15.0)
LYMPHS ABS: 1.1 10*3/uL (ref 0.7–4.0)
Lymphocytes Relative: 15.2 % (ref 12.0–46.0)
MCHC: 33.2 g/dL (ref 30.0–36.0)
MCV: 91.8 fl (ref 78.0–100.0)
MONO ABS: 0.5 10*3/uL (ref 0.1–1.0)
Monocytes Relative: 6.7 % (ref 3.0–12.0)
NEUTROS ABS: 5.6 10*3/uL (ref 1.4–7.7)
Neutrophils Relative %: 76.5 % (ref 43.0–77.0)
Platelets: 254 10*3/uL (ref 150.0–400.0)
RBC: 3.36 Mil/uL — ABNORMAL LOW (ref 3.87–5.11)
RDW: 13.9 % (ref 11.5–15.5)
WBC: 7.3 10*3/uL (ref 4.0–10.5)

## 2015-12-24 LAB — COMPREHENSIVE METABOLIC PANEL
ALK PHOS: 106 U/L (ref 39–117)
ALT: 8 U/L (ref 0–35)
AST: 12 U/L (ref 0–37)
Albumin: 3.7 g/dL (ref 3.5–5.2)
BUN: 34 mg/dL — AB (ref 6–23)
CO2: 27 meq/L (ref 19–32)
Calcium: 9.2 mg/dL (ref 8.4–10.5)
Chloride: 104 mEq/L (ref 96–112)
Creatinine, Ser: 1.52 mg/dL — ABNORMAL HIGH (ref 0.40–1.20)
GFR: 34.57 mL/min — ABNORMAL LOW (ref >60.00)
GLUCOSE: 93 mg/dL (ref 70–99)
POTASSIUM: 4.5 meq/L (ref 3.5–5.1)
SODIUM: 139 meq/L (ref 135–145)
TOTAL PROTEIN: 5.9 g/dL — AB (ref 6.0–8.3)
Total Bilirubin: 1.2 mg/dL (ref 0.2–1.2)

## 2015-12-24 MED ORDER — FLUCONAZOLE 200 MG PO TABS: ORAL_TABLET | ORAL | Status: DC

## 2015-12-24 NOTE — Patient Instructions (Addendum)
Nice to see you. Given you have not improved with topical treatment we will switch her to Diflucan. We will check lab work as well today. If you develop difficulty swallowing or fevers or spreading rash or any new or changing symptoms please seek medical attention.

## 2015-12-24 NOTE — Assessment & Plan Note (Signed)
Lesion on tongue still appears consistent with thrush, mildly worse than previously. No oropharyngeal lesions. No swelling of the mouth or tongue or oropharynx. Has not responded to nystatin swish and swallow. Did not tolerate miconazole troches. Given lack of response we will change her to Diflucan renally dosed at 400 mg loading dose and 50 mg daily following that. I did discuss risk of liver damage with this medication and advise if she had right upper quadrant discomfort she would need to seek medical attention and stop the medication. We will check liver function tests today. We will also check CBC. Advised she can eat whatever she wanted. She'll monitor and is given return precautions.

## 2015-12-24 NOTE — Progress Notes (Signed)
Pre visit review using our clinic review tool, if applicable. No additional management support is needed unless otherwise documented below in the visit note. 

## 2015-12-24 NOTE — Progress Notes (Signed)
Patient ID: Savannah Wilson, female   DOB: 1928/10/07, 82 y.o.   MRN: [id number]  Tommi Rumps, MD Phone: [phone]  Savannah Wilson is a 82 y.o. female who presents today for follow-up.  Diagnosed with thrush previously. Patient has had 4-5 days of nystatin swish and swallow for her thrush. She notes it has not helped. She still has white plaque on her tongue that burns. She is able to swallow okay. Notes no other rash inside of her mouth. No fevers. Please see prior notes for illness course.    ROS see history of present illness  Objective  Physical Exam Filed Vitals:   12/24/15 1116  BP: 136/72  Pulse: 53  Temp: 98.2 F (36.8 C)    BP Readings from Last 3 Encounters:  12/24/15 136/72  12/18/15 140/60  12/11/15 146/74   Wt Readings from Last 3 Encounters:  12/24/15 159 lb 6.4 oz (72.303 kg)  12/18/15 161 lb (73.029 kg)  12/11/15 161 lb 3.2 oz (73.12 kg)    Physical Exam  Constitutional: She is well-developed, well-nourished, and in no distress.  HENT:  Head: Normocephalic and atraumatic.  Right Ear: External ear normal.  Left Ear: External ear normal.  White plaque noted on right aspect of tongue minimal whiteness on the left aspect of the tongue, no posterior oropharyngeal plaques or erythema, no other lesions inside the mouth or lips No swelling of the lips or oropharynx  Eyes: Conjunctivae are normal. Pupils are equal, round, and reactive to light.  Cardiovascular: Normal rate, regular rhythm and normal heart sounds.   Pulmonary/Chest: Effort normal and breath sounds normal.  Neurological: She is alert. Gait normal.  Skin: She is not diaphoretic.     Assessment/Plan: Please see individual problem list.  Thrush Lesion on tongue still appears consistent with thrush, mildly worse than previously. No oropharyngeal lesions. No swelling of the mouth or tongue or oropharynx. Has not responded to nystatin swish and swallow. Did not tolerate miconazole troches. Given  lack of response we will change her to Diflucan renally dosed at 400 mg loading dose and 50 mg daily following that. I did discuss risk of liver damage with this medication and advise if she had right upper quadrant discomfort she would need to seek medical attention and stop the medication. We will check liver function tests today. We will also check CBC. Advised she can eat whatever she wanted. She'll monitor and is given return precautions.    Orders Placed This Encounter  Procedures  . CBC w/Diff  . Comp Met (CMET)    Meds ordered this encounter  Medications  . fluconazole (DIFLUCAN) 200 MG tablet    Sig: Please take 400 mg (2 tablets) today, then take 100 mg (1/2 tablet) daily for 13 days    Dispense:  9 tablet    Refill:  0    Tommi Rumps, MD [place]

## 2016-01-20 ENCOUNTER — Encounter: Payer: Self-pay | Admitting: Internal Medicine

## 2016-01-20 ENCOUNTER — Ambulatory Visit (INDEPENDENT_AMBULATORY_CARE_PROVIDER_SITE_OTHER): Payer: Medicare Other | Admitting: Internal Medicine

## 2016-01-20 VITALS — BP 128/60 | HR 53 | Temp 97.6°F | Ht 60.0 in | Wt 155.0 lb

## 2016-01-20 DIAGNOSIS — N184 Chronic kidney disease, stage 4 (severe): Secondary | ICD-10-CM

## 2016-01-20 DIAGNOSIS — N189 Chronic kidney disease, unspecified: Secondary | ICD-10-CM

## 2016-01-20 DIAGNOSIS — T783XXS Angioneurotic edema, sequela: Secondary | ICD-10-CM

## 2016-01-20 DIAGNOSIS — D631 Anemia in chronic kidney disease: Secondary | ICD-10-CM

## 2016-01-20 DIAGNOSIS — I10 Essential (primary) hypertension: Secondary | ICD-10-CM | POA: Diagnosis not present

## 2016-01-20 DIAGNOSIS — F411 Generalized anxiety disorder: Secondary | ICD-10-CM

## 2016-01-20 DIAGNOSIS — B370 Candidal stomatitis: Secondary | ICD-10-CM

## 2016-01-20 MED ORDER — ALPRAZOLAM 0.5 MG PO TABS: 0.5000 mg | ORAL_TABLET | Freq: Every evening | ORAL | Status: DC | PRN

## 2016-01-20 NOTE — Patient Instructions (Addendum)
    I think your reaction may have been thrush , followed by persistent  irritation caused by certain foods.  Ok  to resume low sodium ham and cheese, but one new food at a time!  No more onion,  Strawberries or cranberries  Avoid tomatoes for now  Add back a new food every 2-3 days and keep a diary of symptoms.   I have refilled your xanax to help your insomnia .

## 2016-01-20 NOTE — Progress Notes (Signed)
Subjective:  Patient ID: Savannah Wilson, female    DOB: March 10, 1929  Age: 82 y.o. MRN: [id number]  CC: The primary encounter diagnosis was Angioedema, sequela. Diagnoses of Anxiety state, Anemia in chronic kidney disease (CKD), Essential hypertension, CKD (chronic kidney disease), stage IV ([place]), and Thrush were also pertinent to this visit.  HPI SIANN BUTTO presents for follow up on  Severe tongue and throat irritatio lasting over a month, presumed to be due to thrush that was resistant to 3 courses of antifungal  (mycelex losenges, not tolerated,  Then nystatin finished,  Then fluconazole oral).  Patient saw ENT Dr  Tami Ribas last week who changed treatment to a presciption strength numbing spray (she's not sure which) .which has helped, and she has been restriting her diet to avoid irritating her tongue.    Symptoms started in late April and may have began after eating strawberries and cranberries .  Has lost 6 lbs since then due to odynophagia. .  Diet has been bland: a lot of potatoes,  Green beans,  Canned peaches and cottage cheese, recently added ground round , and drinking a lot of water    Tongue did not hurt at all yesterday until she ate a hamburger made from ground round  And grilled onions   2) anemia of CKD  hgb 10 . Nephrology has recommended adding Procrit infusion for when hgb < 10  .  GAD: using xanax at night for insomnia secondary to worrying about her daughter [facility]    Outpatient Prescriptions Prior to Visit  Medication Sig Dispense Refill  . acetaminophen (TYLENOL) 650 MG CR tablet Take 650 mg by mouth every 8 (eight) hours as needed.    Marland Kitchen albuterol (PROVENTIL HFA;VENTOLIN HFA) 108 (90 BASE) MCG/ACT inhaler Inhale 2 puffs into the lungs every 6 (six) hours as needed for wheezing or shortness of breath. 1 Inhaler 2  . Beclomethasone Dipropionate 80 MCG/ACT AERS Place 2 sprays into the nose daily. 8.7 g 11  . cyanocobalamin ([facility] VITAMIN B12) 2000 MCG tablet Take 1 tablet  (2,000 mcg total) by mouth daily. 30 tablet 1  . DiphenhydrAMINE HCl (BENADRYL ALLERGY PO) Take 1 tablet by mouth at bedtime. Take by mouth as needed.    . DULERA 100-5 MCG/ACT AERO USE 2 PUFFS TWICE DAILY 13 g 3  . fexofenadine (ALLEGRA) 180 MG tablet Take 1 tablet (180 mg total) by mouth every morning. 30 tablet 2  . fluticasone (FLONASE) 50 MCG/ACT nasal spray Place 2 sprays into the nose daily. 16 g 6  . furosemide (LASIX) 40 MG tablet TAKE ONE TABLET BY MOUTH EVERY DAY. 30 tablet 6  . hydrALAZINE (APRESOLINE) 25 MG tablet Take 1 tablet (25 mg total) by mouth 3 (three) times daily. 30 tablet 3  . hydrOXYzine (ATARAX/VISTARIL) 25 MG tablet Take 1 tablet (25 mg total) by mouth 3 (three) times daily as needed for anxiety or itching. 90 tablet 1  . Infant Care Products (DERMACLOUD) CREA Apply liberally to  Vaginal folds. 430 g 0  . levothyroxine (SYNTHROID, LEVOTHROID) 100 MCG tablet TAKE ONE TABLET BY MOUTH EVERY DAY 90 tablet 1  . loperamide (IMODIUM A-D) 2 MG tablet Take 2 mg by mouth 4 (four) times daily as needed.      Marland Kitchen losartan (COZAAR) 100 MG tablet Take 1 tablet (100 mg total) by mouth daily. 30 tablet 3  . metoprolol tartrate (LOPRESSOR) 25 MG tablet TAKE ONE TABLET BY MOUTH TWICE DAILY 60 tablet 12  .  omeprazole (PRILOSEC) 40 MG capsule TAKE ONE CAPSULE BY MOUTH TWICE A DAY. BEFORE BREAKFAST AND SUPPER 60 capsule 4  . ondansetron (ZOFRAN) 4 MG tablet Take 4 mg by mouth every 4 (four) hours as needed. For nausea and vomiting    . pantoprazole (PROTONIX) 40 MG tablet TAKE 1 TABLET BY MOUTH TWICE DAILY BEFORE A MEAL 60 tablet 5  . pramipexole (MIRAPEX) 0.125 MG tablet Take 1 tablet (0.125 mg total) by mouth 3 (three) times daily. 30 tablet 11  . predniSONE (DELTASONE) 20 MG tablet 6 tablets all at once on Day 1,  Then taper by 1 tablet daily until gone 21 tablet 0  . PROAIR HFA 108 (90 BASE) MCG/ACT inhaler INHALE 2 PUFFS EVERY 6 HOURS AS NEEDED FOR WHEEZING 8.5 g 3  . sertraline (ZOLOFT)  50 MG tablet Take 1 tablet (50 mg total) by mouth daily. 90 tablet 3  . Spacer/Aero-Holding Chambers (OPTICHAMBER ADVANTAGE) MISC 1 each by Other route once. Always uses her when you're using a metered-dose inhaler. You've aromatase medicine as much, he won't have his much side effect, but you it twice as much medicine and your lungs. 1 each 0  . VITAMIN D, ERGOCALCIFEROL, PO Take 1.25 mg by mouth every 30 (thirty) days.    . ALPRAZolam (XANAX) 0.5 MG tablet TAKE ONE TABLET BY MOUTH TWICE DAILY AS NEEDED 60 tablet 0  . fluconazole (DIFLUCAN) 200 MG tablet Please take 400 mg (2 tablets) today, then take 100 mg (1/2 tablet) daily for 13 days (Patient not taking: Reported on 01/20/2016) 9 tablet 0   Facility-Administered Medications Prior to Visit  Medication Dose Route Frequency Provider Last Rate Last Dose  . albuterol (PROVENTIL) (2.5 MG/3ML) 0.083% nebulizer solution 2.5 mg  2.5 mg Nebulization Once [name], MD        Review of Systems;  Patient denies headache, fevers, malaise, unintentional weight loss, skin rash, eye pain, sinus congestion and sinus pain, sore throat, dysphagia,  hemoptysis , cough, dyspnea, wheezing, chest pain, palpitations, orthopnea, edema, abdominal pain, nausea, melena, diarrhea, constipation, flank pain, dysuria, hematuria, urinary  Frequency, nocturia, numbness, tingling, seizures,  Focal weakness, Loss of consciousness,  Tremor, insomnia, depression, anxiety, and suicidal ideation.      Objective:  BP 128/60 mmHg  Pulse 53  Temp(Src) 97.6 F (36.4 C) (Oral)  Ht 5' (1.524 m)  Wt 155 lb (70.308 kg)  BMI 30.27 kg/m2  SpO2 96%  BP Readings from Last 3 Encounters:  01/20/16 128/60  12/24/15 136/72  12/18/15 140/60    Wt Readings from Last 3 Encounters:  01/20/16 155 lb (70.308 kg)  12/24/15 159 lb 6.4 oz (72.303 kg)  12/18/15 161 lb (73.029 kg)    General appearance: alert, cooperative and appears stated age Ears: normal TM's and external ear  canals both ears Throat: lips, mucosa, and tongue normal; teeth and gums normal Neck: no adenopathy, no carotid bruit, supple, symmetrical, trachea midline and thyroid not enlarged, symmetric, no tenderness/mass/nodules Back: symmetric, no curvature. ROM normal. No CVA tenderness. Lungs: clear to auscultation bilaterally Heart: regular rate and rhythm, S1, S2 normal, no murmur, click, rub or gallop Abdomen: soft, non-tender; bowel sounds normal; no masses,  no organomegaly Pulses: 2+ and symmetric Skin: Skin color, texture, turgor normal. No rashes or lesions Lymph nodes: Cervical, supraclavicular, and axillary nodes normal.  No results found for: HGBA1C  Lab Results  Component Value Date   CREATININE 1.52* 12/24/2015   CREATININE 1.53* 06/08/2015   CREATININE 1.67*  10/28/2014    Lab Results  Component Value Date   WBC 7.3 12/24/2015   HGB 10.2* 12/24/2015   HCT 30.9* 12/24/2015   PLT 254.0 12/24/2015   GLUCOSE 93 12/24/2015   CHOL 116 10/28/2013   TRIG 159.0* 10/28/2013   HDL 26.30* 10/28/2013   LDLCALC 58 10/28/2013   ALT 8 12/24/2015   AST 12 12/24/2015   NA 139 12/24/2015   K 4.5 12/24/2015   CL 104 12/24/2015   CREATININE 1.52* 12/24/2015   BUN 34* 12/24/2015   CO2 27 12/24/2015   TSH 1.62 10/28/2014    Dg Chest 2 View  06/08/2015  CLINICAL DATA:  Cough since last week productive with yellow sputum, chest pain, personal history of hypertension, asthma, pancreatitis EXAM: CHEST  2 VIEW COMPARISON:  10/26/2012 FINDINGS: Borderline enlargement of cardiac silhouette. Atherosclerotic calcification aorta. Moderate-sized hiatal hernia. Mediastinal contours and pulmonary vascularity otherwise normal. Lungs minimally hyperinflated but clear. Small bibasilar effusions blunt the posterior costophrenic angle. No pneumothorax. Bones demineralized with scattered endplate spur formation thoracic spine. IMPRESSION: Small bibasilar pleural effusions. Moderate-sized hiatal hernia.  Electronically Signed   By: Lavonia Dana M.D.   On: 06/08/2015 13:16    Assessment & Plan:   Problem List Items Addressed This Visit    CKD (chronic kidney disease), stage IV ([place])    Secondary to hypertension.  She has no plans to consider dialysis given her age and her observations of her daughter Pam's lifestyle.  She is aware that her life expectancy is reduced and she has advance dierctives in place.         Hypertension    Well controlled on current regimen. Renal function stable, no changes today. Lab Results  Component Value Date   CREATININE 1.52* 12/24/2015   Lab Results  Component Value Date   NA 139 12/24/2015   K 4.5 12/24/2015   CL 104 12/24/2015   CO2 27 12/24/2015          Anemia in chronic kidney disease (CKD)    with concurrent B12 deficiency.  Not taking oral iron.  Will receive Procrit when hgb < 10       Anxiety state    Chronic, secondary to concern about her daughter [name], who has MM and ESKD.  The risks and benefits of benzodiazepine use were reviewed with patient today including excessive sedation leading to respiratory depression,  impaired thinking/driving, and addiction.  Patient was advised to avoid concurrent use with alcohol, to use medication only as needed and not to share with others  .         Relevant Medications   ALPRAZolam (XANAX) 0.5 MG tablet   Angioedema - Primary   Thrush    Secondary to steroid taper given on April 30 for viral URI.   She was treated for thrush initially,  3 times,  And is now using a lidocaine spray . Tongue and O/p have no signs of thrush currently , only 2 enlarge papille on tongue.  Records from dr Tami Ribas have been requested.         A total of 25 minutes of face to face time was spent with patient more than half of which was spent in counselling about the above mentioned conditions  and coordination of care    I have changed Ms. Cherian's ALPRAZolam. I am also having her maintain her loperamide,  acetaminophen, DiphenhydrAMINE HCl (BENADRYL ALLERGY PO), losartan, hydrALAZINE, sertraline, ondansetron, cyanocobalamin, Beclomethasone Dipropionate, fluticasone, pramipexole, (VITAMIN D, ERGOCALCIFEROL, PO),  pantoprazole, fexofenadine, DERMACLOUD, DULERA, PROAIR HFA, albuterol, OPTICHAMBER ADVANTAGE, hydrOXYzine, furosemide, metoprolol tartrate, omeprazole, predniSONE, levothyroxine, and fluconazole. We will continue to administer albuterol.  Meds ordered this encounter  Medications  . ALPRAZolam (XANAX) 0.5 MG tablet    Sig: Take 1 tablet (0.5 mg total) by mouth at bedtime as needed for anxiety.    Dispense:  30 tablet    Refill:  5    FAX REQUEST TO FOLLOW - CONTROLLED SUBSTANCE    Medications Discontinued During This Encounter  Medication Reason  . ALPRAZolam (XANAX) 0.5 MG tablet Reorder    Follow-up: Return in about 6 months (around 07/21/2016).   [name], MD

## 2016-01-20 NOTE — Progress Notes (Signed)
Pre-visit discussion using our clinic review tool. No additional management support is needed unless otherwise documented below in the visit note.  

## 2016-01-21 NOTE — Assessment & Plan Note (Deleted)
Unclear if her current episode was angioedema since i did not see her,  She was treated for thrush initially,  3 times,  And is now using a lidocaine spray .  Records from dr Tami Ribas have been requested.

## 2016-01-21 NOTE — Assessment & Plan Note (Signed)
Well controlled on current regimen. Renal function stable, no changes today. Lab Results  Component Value Date   CREATININE 1.52* 12/24/2015   Lab Results  Component Value Date   NA 139 12/24/2015   K 4.5 12/24/2015   CL 104 12/24/2015   CO2 27 12/24/2015

## 2016-01-21 NOTE — Assessment & Plan Note (Signed)
with concurrent B12 deficiency.  Not taking oral iron.  Will receive Procrit when hgb < 10  

## 2016-01-21 NOTE — Assessment & Plan Note (Addendum)
Secondary to steroid taper given on April 30 for viral URI.   She was treated for thrush initially,  3 times,  And is now using a lidocaine spray . Tongue and O/p have no signs of thrush currently , only 2 enlarge papille on tongue.  Records from dr Tami Ribas have been requested.

## 2016-01-21 NOTE — Assessment & Plan Note (Signed)
Chronic, secondary to concern about her daughter [name], who has MM and ESKD.  The risks and benefits of benzodiazepine use were reviewed with patient today including excessive sedation leading to respiratory depression,  impaired thinking/driving, and addiction.  Patient was advised to avoid concurrent use with alcohol, to use medication only as needed and not to share with others  .

## 2016-01-21 NOTE — Assessment & Plan Note (Signed)
Secondary to hypertension.  She has no plans to consider dialysis given her age and her observations of her daughter Pam's lifestyle.  She is aware that her life expectancy is reduced and she has advance dierctives in place.

## 2016-02-19 ENCOUNTER — Other Ambulatory Visit: Payer: Self-pay | Admitting: Internal Medicine

## 2016-03-16 ENCOUNTER — Other Ambulatory Visit: Payer: Self-pay | Admitting: Internal Medicine

## 2016-04-22 ENCOUNTER — Other Ambulatory Visit: Payer: Self-pay | Admitting: Internal Medicine

## 2016-06-06 ENCOUNTER — Encounter: Payer: Self-pay | Admitting: Internal Medicine

## 2016-06-06 ENCOUNTER — Ambulatory Visit: Payer: Medicare Other | Admitting: Family Medicine

## 2016-06-06 ENCOUNTER — Ambulatory Visit (INDEPENDENT_AMBULATORY_CARE_PROVIDER_SITE_OTHER): Payer: Medicare Other | Admitting: Internal Medicine

## 2016-06-06 ENCOUNTER — Ambulatory Visit (INDEPENDENT_AMBULATORY_CARE_PROVIDER_SITE_OTHER): Payer: Medicare Other

## 2016-06-06 VITALS — BP 140/70 | HR 57 | Temp 97.7°F | Wt 152.8 lb

## 2016-06-06 DIAGNOSIS — J90 Pleural effusion, not elsewhere classified: Secondary | ICD-10-CM

## 2016-06-06 DIAGNOSIS — J209 Acute bronchitis, unspecified: Secondary | ICD-10-CM

## 2016-06-06 DIAGNOSIS — R05 Cough: Secondary | ICD-10-CM

## 2016-06-06 DIAGNOSIS — R059 Cough, unspecified: Secondary | ICD-10-CM

## 2016-06-06 MED ORDER — GUAIFENESIN-CODEINE 100-10 MG/5ML PO SYRP: 5.0000 mL | ORAL_SOLUTION | Freq: Three times a day (TID) | ORAL | 0 refills | Status: DC | PRN

## 2016-06-06 MED ORDER — PREDNISONE 10 MG PO TABS: ORAL_TABLET | ORAL | 0 refills | Status: DC

## 2016-06-06 MED ORDER — AMOXICILLIN-POT CLAVULANATE 875-125 MG PO TABS: 1.0000 | ORAL_TABLET | Freq: Two times a day (BID) | ORAL | 0 refills | Status: DC

## 2016-06-06 MED ORDER — METHYLPREDNISOLONE ACETATE 40 MG/ML IJ SUSP
40.0000 mg | Freq: Once | INTRAMUSCULAR | Status: AC
  Administered 2016-06-06: 40 mg via INTRAMUSCULAR

## 2016-06-06 NOTE — Patient Instructions (Addendum)
I am treating you for bronchitis , but the antibitoic will also cover pneumonia and sinusitis  Do not use the Dulera more than the maintenance  Dose  Use the albuterol puffer 2 puffs every 4 to 6 hours  Prednisone  Taper (start this medication on Tuesday morning and take all of the pills for that day in the morning)  Augmentin twice daily with food  Cheratussin cough syrup (can be used every 8 hours if needed)   Please take a probiotic ( Align, Floraque or Culturelle), the generic version of one of these over the counter medications, or an alternative form   Yogurt, or another dietary source) for a minimum of 3 weeks to prevent a serious antibiotic associated diarrhea  Called clostridium dificile colitis.  Taking a probiotic may also prevent vaginitis due to yeast infections and can be continued indefinitely if you feel that it improves your digestion or your elimination (bowels).

## 2016-06-06 NOTE — Progress Notes (Signed)
Subjective:  Patient ID: NANDINI BOGDANSKI, female    DOB: [birth year]  Age: 82 y.o. MRN: [id number]  CC: The primary encounter diagnosis was Cough. A diagnosis of Acute bronchitis, unspecified organism was also pertinent to this visit.  HPI ALAHNI VARONE presents for evaluation of respiratory infection  Symptoms present for the past 6 days,  Felt better initially,  But then on Friday started feeling much worse, had a temp of 100. Marland Kitchen  Has been using steroid nasal spray and albuterol.  Coughing at night?  All night long .  Has       Outpatient Medications Prior to Visit  Medication Sig Dispense Refill  . acetaminophen (TYLENOL) 650 MG CR tablet Take 650 mg by mouth every 8 (eight) hours as needed.    Marland Kitchen albuterol (PROVENTIL HFA;VENTOLIN HFA) 108 (90 BASE) MCG/ACT inhaler Inhale 2 puffs into the lungs every 6 (six) hours as needed for wheezing or shortness of breath. 1 Inhaler 2  . ALPRAZolam (XANAX) 0.5 MG tablet Take 1 tablet (0.5 mg total) by mouth at bedtime as needed for anxiety. 30 tablet 5  . Beclomethasone Dipropionate 80 MCG/ACT AERS Place 2 sprays into the nose daily. 8.7 g 11  . cyanocobalamin ([employer] VITAMIN B12) 2000 MCG tablet Take 1 tablet (2,000 mcg total) by mouth daily. 30 tablet 1  . DiphenhydrAMINE HCl (BENADRYL ALLERGY PO) Take 1 tablet by mouth at bedtime. Take by mouth as needed.    . DULERA 100-5 MCG/ACT AERO USE 2 PUFFS TWICE DAILY. 13 g 3  . fexofenadine (ALLEGRA) 180 MG tablet Take 1 tablet (180 mg total) by mouth every morning. 30 tablet 2  . fluticasone (FLONASE) 50 MCG/ACT nasal spray Place 2 sprays into the nose daily. 16 g 6  . furosemide (LASIX) 40 MG tablet TAKE ONE TABLET BY MOUTH EVERY DAY 30 tablet 5  . hydrALAZINE (APRESOLINE) 25 MG tablet Take 1 tablet (25 mg total) by mouth 3 (three) times daily. 30 tablet 3  . hydrOXYzine (ATARAX/VISTARIL) 25 MG tablet Take 1 tablet (25 mg total) by mouth 3 (three) times daily as needed for anxiety or itching. 90 tablet 1  .  Infant Care Products (DERMACLOUD) CREA Apply liberally to  Vaginal folds. 430 g 0  . levothyroxine (SYNTHROID, LEVOTHROID) 100 MCG tablet TAKE ONE TABLET BY MOUTH EVERY DAY 90 tablet 1  . loperamide (IMODIUM A-D) 2 MG tablet Take 2 mg by mouth 4 (four) times daily as needed.      Marland Kitchen losartan (COZAAR) 100 MG tablet Take 1 tablet (100 mg total) by mouth daily. 30 tablet 3  . metoprolol tartrate (LOPRESSOR) 25 MG tablet TAKE ONE TABLET BY MOUTH TWICE DAILY 60 tablet 12  . omeprazole (PRILOSEC) 40 MG capsule TAKE ONE CAPSULE BY MOUTH TWICE A DAY. BEFORE BREAKFAST AND SUPPER 60 capsule 3  . ondansetron (ZOFRAN) 4 MG tablet Take 4 mg by mouth every 4 (four) hours as needed. For nausea and vomiting    . pantoprazole (PROTONIX) 40 MG tablet TAKE 1 TABLET BY MOUTH TWICE DAILY BEFORE A MEAL 60 tablet 5  . pramipexole (MIRAPEX) 0.125 MG tablet Take 1 tablet (0.125 mg total) by mouth 3 (three) times daily. 30 tablet 11  . predniSONE (DELTASONE) 20 MG tablet 6 tablets all at once on Day 1,  Then taper by 1 tablet daily until gone 21 tablet 0  . PROAIR HFA 108 (90 BASE) MCG/ACT inhaler INHALE 2 PUFFS EVERY 6 HOURS AS NEEDED FOR WHEEZING 8.5  g 3  . Spacer/Aero-Holding Chambers (OPTICHAMBER ADVANTAGE) MISC 1 each by Other route once. Always uses her when you're using a metered-dose inhaler. You've aromatase medicine as much, he won't have his much side effect, but you it twice as much medicine and your lungs. 1 each 0  . VITAMIN D, ERGOCALCIFEROL, PO Take 1.25 mg by mouth every 30 (thirty) days.    . fluconazole (DIFLUCAN) 200 MG tablet Please take 400 mg (2 tablets) today, then take 100 mg (1/2 tablet) daily for 13 days (Patient not taking: Reported on 06/06/2016) 9 tablet 0  . sertraline (ZOLOFT) 50 MG tablet Take 1 tablet (50 mg total) by mouth daily. (Patient not taking: Reported on 06/06/2016) 90 tablet 3  . albuterol (PROVENTIL) (2.5 MG/3ML) 0.083% nebulizer solution 2.5 mg      No facility-administered  medications prior to visit.     Review of Systems;  Patient denies headache, fevers, malaise, unintentional weight loss, skin rash, eye pain, sinus congestion and sinus pain, sore throat, dysphagia,  hemoptysis , cough, dyspnea, wheezing, chest pain, palpitations, orthopnea, edema, abdominal pain, nausea, melena, diarrhea, constipation, flank pain, dysuria, hematuria, urinary  Frequency, nocturia, numbness, tingling, seizures,  Focal weakness, Loss of consciousness,  Tremor, insomnia, depression, anxiety, and suicidal ideation.      Objective:  BP 140/70   Pulse (!) 57   Temp 97.7 F (36.5 C)   Wt 152 lb 12.8 oz (69.3 kg)   SpO2 97%   BMI 29.84 kg/m   BP Readings from Last 3 Encounters:  06/06/16 140/70  01/20/16 128/60  12/24/15 136/72    Wt Readings from Last 3 Encounters:  06/06/16 152 lb 12.8 oz (69.3 kg)  01/20/16 155 lb (70.3 kg)  12/24/15 159 lb 6.4 oz (72.3 kg)    General appearance: alert, cooperative and appears stated age Ears: normal TM's and external ear canals both ears Throat: lips, mucosa, and tongue normal; teeth and gums normal Neck: no adenopathy, no carotid bruit, supple, symmetrical, trachea midline and thyroid not enlarged, symmetric, no tenderness/mass/nodules Back: symmetric, no curvature. ROM normal. No CVA tenderness. Lungs: clear to auscultation bilaterally Heart: regular rate and rhythm, S1, S2 normal, no murmur, click, rub or gallop Abdomen: soft, non-tender; bowel sounds normal; no masses,  no organomegaly Pulses: 2+ and symmetric Skin: Skin color, texture, turgor normal. No rashes or lesions Lymph nodes: Cervical, supraclavicular, and axillary nodes normal.  No results found for: HGBA1C  Lab Results  Component Value Date   CREATININE 1.52 (H) 12/24/2015   CREATININE 1.53 (H) 06/08/2015   CREATININE 1.67 (H) 10/28/2014    Lab Results  Component Value Date   WBC 7.3 12/24/2015   HGB 10.2 (L) 12/24/2015   HCT 30.9 (L) 12/24/2015    PLT 254.0 12/24/2015   GLUCOSE 93 12/24/2015   CHOL 116 10/28/2013   TRIG 159.0 (H) 10/28/2013   HDL 26.30 (L) 10/28/2013   LDLCALC 58 10/28/2013   ALT 8 12/24/2015   AST 12 12/24/2015   NA 139 12/24/2015   K 4.5 12/24/2015   CL 104 12/24/2015   CREATININE 1.52 (H) 12/24/2015   BUN 34 (H) 12/24/2015   CO2 27 12/24/2015   TSH 1.62 10/28/2014    Dg Chest 2 View  Result Date: 06/08/2015 CLINICAL DATA:  Cough since last week productive with yellow sputum, chest pain, personal history of hypertension, asthma, pancreatitis EXAM: CHEST  2 VIEW COMPARISON:  10/26/2012 FINDINGS: Borderline enlargement of cardiac silhouette. Atherosclerotic calcification aorta. Moderate-sized hiatal hernia. Mediastinal  contours and pulmonary vascularity otherwise normal. Lungs minimally hyperinflated but clear. Small bibasilar effusions blunt the posterior costophrenic angle. No pneumothorax. Bones demineralized with scattered endplate spur formation thoracic spine. IMPRESSION: Small bibasilar pleural effusions. Moderate-sized hiatal hernia. Electronically Signed   By: Lavonia Dana M.D.   On: 06/08/2015 13:16    Assessment & Plan:   Problem List Items Addressed This Visit    Acute bronchitis    Empiric antibiiotics, steroid taper and inhaled bronchodilators given prolonged history of productive cough and new onset chest heaviness..  Chest x ray  shows a small right pleural I=effusion but no infiltrate.        Other Visit Diagnoses    Cough    -  Primary   Relevant Medications   methylPREDNISolone acetate (DEPO-MEDROL) injection 40 mg (Completed)   Other Relevant Orders   DG Chest 2 View (Completed)      I have discontinued Ms. Fawley's sertraline and fluconazole. I am also having her start on amoxicillin-clavulanate, predniSONE, and guaiFENesin-codeine. Additionally, I am having her maintain her loperamide, acetaminophen, DiphenhydrAMINE HCl (BENADRYL ALLERGY PO), losartan, hydrALAZINE, ondansetron,  cyanocobalamin, Beclomethasone Dipropionate, fluticasone, pramipexole, (VITAMIN D, ERGOCALCIFEROL, PO), pantoprazole, fexofenadine, DERMACLOUD, PROAIR HFA, albuterol, OPTICHAMBER ADVANTAGE, hydrOXYzine, metoprolol tartrate, predniSONE, levothyroxine, ALPRAZolam, furosemide, omeprazole, and DULERA. We administered albuterol and methylPREDNISolone acetate.  Meds ordered this encounter  Medications  . amoxicillin-clavulanate (AUGMENTIN) 875-125 MG tablet    Sig: Take 1 tablet by mouth 2 (two) times daily.    Dispense:  14 tablet    Refill:  0  . predniSONE (DELTASONE) 10 MG tablet    Sig: 6 tablets on Day 1 , then reduce by 1 tablet daily until gone    Dispense:  21 tablet    Refill:  0  . guaiFENesin-codeine (CHERATUSSIN AC) 100-10 MG/5ML syrup    Sig: Take 5 mLs by mouth 3 (three) times daily as needed for cough.    Dispense:  120 mL    Refill:  0  . methylPREDNISolone acetate (DEPO-MEDROL) injection 40 mg    Medications Discontinued During This Encounter  Medication Reason  . fluconazole (DIFLUCAN) 200 MG tablet Completed Course  . sertraline (ZOLOFT) 50 MG tablet Change in therapy    Follow-up: No Follow-up on file.   [name], MD

## 2016-06-07 ENCOUNTER — Encounter: Payer: Self-pay | Admitting: Internal Medicine

## 2016-06-07 DIAGNOSIS — J069 Acute upper respiratory infection, unspecified: Secondary | ICD-10-CM | POA: Insufficient documentation

## 2016-06-07 NOTE — Assessment & Plan Note (Addendum)
Empiric antibiiotics, steroid taper and inhaled bronchodilators given prolonged history of productive cough and new onset chest heaviness..  Chest x ray  shows a small right pleural I=effusion but no infiltrate.

## 2016-06-09 ENCOUNTER — Encounter: Payer: Self-pay | Admitting: Internal Medicine

## 2016-06-09 ENCOUNTER — Other Ambulatory Visit: Payer: Self-pay | Admitting: Internal Medicine

## 2016-06-09 ENCOUNTER — Telehealth: Payer: Self-pay | Admitting: Internal Medicine

## 2016-06-09 DIAGNOSIS — E66811 Obesity, class 1: Secondary | ICD-10-CM

## 2016-06-09 DIAGNOSIS — E034 Atrophy of thyroid (acquired): Secondary | ICD-10-CM

## 2016-06-09 DIAGNOSIS — E669 Obesity, unspecified: Secondary | ICD-10-CM

## 2016-06-09 NOTE — Telephone Encounter (Signed)
Last TSH 3/16 please advise to refill Levothyroxine?

## 2016-06-15 ENCOUNTER — Other Ambulatory Visit: Payer: Medicare Other

## 2016-06-16 ENCOUNTER — Other Ambulatory Visit: Payer: Self-pay | Admitting: Internal Medicine

## 2016-06-16 ENCOUNTER — Other Ambulatory Visit (INDEPENDENT_AMBULATORY_CARE_PROVIDER_SITE_OTHER): Payer: Medicare Other

## 2016-06-16 DIAGNOSIS — N2581 Secondary hyperparathyroidism of renal origin: Secondary | ICD-10-CM

## 2016-06-16 DIAGNOSIS — E66811 Obesity, class 1: Secondary | ICD-10-CM

## 2016-06-16 DIAGNOSIS — E034 Atrophy of thyroid (acquired): Secondary | ICD-10-CM | POA: Diagnosis not present

## 2016-06-16 DIAGNOSIS — D631 Anemia in chronic kidney disease: Secondary | ICD-10-CM | POA: Diagnosis not present

## 2016-06-16 DIAGNOSIS — I10 Essential (primary) hypertension: Secondary | ICD-10-CM

## 2016-06-16 DIAGNOSIS — E669 Obesity, unspecified: Secondary | ICD-10-CM | POA: Diagnosis not present

## 2016-06-16 DIAGNOSIS — N189 Chronic kidney disease, unspecified: Secondary | ICD-10-CM | POA: Diagnosis not present

## 2016-06-16 LAB — CBC WITH DIFFERENTIAL/PLATELET
Basophils Absolute: 0 10*3/uL (ref 0.0–0.1)
Basophils Relative: 0.5 % (ref 0.0–3.0)
EOS PCT: 1.8 % (ref 0.0–5.0)
Eosinophils Absolute: 0.2 10*3/uL (ref 0.0–0.7)
HEMATOCRIT: 33.1 % — AB (ref 36.0–46.0)
Hemoglobin: 10.9 g/dL — ABNORMAL LOW (ref 12.0–15.0)
LYMPHS ABS: 1.1 10*3/uL (ref 0.7–4.0)
LYMPHS PCT: 12.2 % (ref 12.0–46.0)
MCHC: 32.7 g/dL (ref 30.0–36.0)
MCV: 91.8 fl (ref 78.0–100.0)
MONOS PCT: 6.6 % (ref 3.0–12.0)
Monocytes Absolute: 0.6 10*3/uL (ref 0.1–1.0)
NEUTROS ABS: 7.3 10*3/uL (ref 1.4–7.7)
NEUTROS PCT: 78.9 % — AB (ref 43.0–77.0)
PLATELETS: 214 10*3/uL (ref 150.0–400.0)
RBC: 3.61 Mil/uL — ABNORMAL LOW (ref 3.87–5.11)
RDW: 14.1 % (ref 11.5–15.5)
WBC: 9.3 10*3/uL (ref 4.0–10.5)

## 2016-06-16 LAB — LIPID PANEL
CHOL/HDL RATIO: 3
Cholesterol: 124 mg/dL (ref 0–200)
HDL: 42.5 mg/dL (ref >39.00)
LDL Cholesterol: 64 mg/dL (ref 0–99)
NONHDL: 81.61
Triglycerides: 87 mg/dL (ref 0.0–149.0)
VLDL: 17.4 mg/dL (ref 0.0–40.0)

## 2016-06-16 LAB — HEMOGLOBIN A1C: HEMOGLOBIN A1C: 5 % (ref 4.6–6.5)

## 2016-06-16 LAB — TSH: TSH: 1.92 u[IU]/mL (ref 0.35–4.50)

## 2016-06-16 LAB — RENAL FUNCTION PANEL
ALBUMIN: 3.6 g/dL (ref 3.5–5.2)
BUN: 33 mg/dL — ABNORMAL HIGH (ref 6–23)
CALCIUM: 9.2 mg/dL (ref 8.4–10.5)
CO2: 28 meq/L (ref 19–32)
Chloride: 102 mEq/L (ref 96–112)
Creatinine, Ser: 1.59 mg/dL — ABNORMAL HIGH (ref 0.40–1.20)
GFR: 32.78 mL/min — AB (ref >60.00)
GLUCOSE: 96 mg/dL (ref 70–99)
POTASSIUM: 3.8 meq/L (ref 3.5–5.1)
Phosphorus: 3.4 mg/dL (ref 2.3–4.6)
Sodium: 138 mEq/L (ref 135–145)

## 2016-06-17 LAB — PARATHYROID HORMONE, INTACT (NO CA): PTH: 61 pg/mL (ref 14–64)

## 2016-06-19 ENCOUNTER — Encounter: Payer: Self-pay | Admitting: Internal Medicine

## 2016-06-20 NOTE — Telephone Encounter (Signed)
Copy of lab results were faxed to [facility] per patients request.

## 2016-07-18 ENCOUNTER — Other Ambulatory Visit: Payer: Self-pay | Admitting: Internal Medicine

## 2016-07-26 ENCOUNTER — Telehealth: Payer: Self-pay | Admitting: Internal Medicine

## 2016-07-26 ENCOUNTER — Encounter: Payer: Self-pay | Admitting: Internal Medicine

## 2016-07-26 ENCOUNTER — Ambulatory Visit (INDEPENDENT_AMBULATORY_CARE_PROVIDER_SITE_OTHER): Payer: Medicare Other | Admitting: Internal Medicine

## 2016-07-26 DIAGNOSIS — I10 Essential (primary) hypertension: Secondary | ICD-10-CM | POA: Diagnosis not present

## 2016-07-26 DIAGNOSIS — E034 Atrophy of thyroid (acquired): Secondary | ICD-10-CM

## 2016-07-26 DIAGNOSIS — I700 Atherosclerosis of aorta: Secondary | ICD-10-CM | POA: Diagnosis not present

## 2016-07-26 NOTE — Assessment & Plan Note (Addendum)
Noted on October chest x ray .  Lipoids are at goal without statin therapy , and given her age, will not advise starting.  Will recommend baby aspirin daily .  Lab Results  Component Value Date   CHOL 124 06/16/2016   HDL 42.50 06/16/2016   LDLCALC 64 06/16/2016   TRIG 87.0 06/16/2016   CHOLHDL 3 06/16/2016

## 2016-07-26 NOTE — Progress Notes (Signed)
Pre-visit discussion using our clinic review tool. No additional management support is needed unless otherwise documented below in the visit note.  

## 2016-07-26 NOTE — Progress Notes (Signed)
Subjective:  Patient ID: Savannah Wilson, female    DOB: 07-02-1929  Age: 82 y.o. MRN: [id number]  CC: Diagnoses of Atherosclerosis of aorta ([place]), Essential hypertension, and Hypothyroidism due to acquired atrophy of thyroid were pertinent to this visit.  HPI Savannah Wilson presents for 6 month follow up  GAD, obesity  Hypertension.  She has lost weight and home bps have been lower that previously seen.      She has been taking 25 of metopolol in the am and 50 mg at night,  But is not sure about her other medications, (chart refledtcts expired prescriptions for hydralazine and losartan ).  She has been noticing excessive fatigue and dizziness at night when she gets up to use the bathroom    Obesity:  Has changed eating habits  Eating lighter more frequently . Admits to having a "sweet tooth"  And admits to eating bacon,  Sausage and country ham on a regular basis .  Discussed her recent chest x ray (done in October) that noted atherosclerosis of aorta    Lab Results  Component Value Date   HGBA1C 5.0 06/16/2016   Lab Results  Component Value Date   CHOL 124 06/16/2016   HDL 42.50 06/16/2016   LDLCALC 64 06/16/2016   TRIG 87.0 06/16/2016   CHOLHDL 3 06/16/2016      Outpatient Medications Prior to Visit  Medication Sig Dispense Refill  . acetaminophen (TYLENOL) 650 MG CR tablet Take 650 mg by mouth every 8 (eight) hours as needed.    . ALPRAZolam (XANAX) 0.5 MG tablet Take 1 tablet (0.5 mg total) by mouth at bedtime as needed for anxiety. 30 tablet 5  . Beclomethasone Dipropionate 80 MCG/ACT AERS Place 2 sprays into the nose daily. 8.7 g 11  . cyanocobalamin ([employer] VITAMIN B12) 2000 MCG tablet Take 1 tablet (2,000 mcg total) by mouth daily. 30 tablet 1  . DULERA 100-5 MCG/ACT AERO USE 2 PUFFS TWICE DAILY. 13 g 3  . furosemide (LASIX) 40 MG tablet TAKE ONE TABLET BY MOUTH EVERY DAY 30 tablet 2  . hydrALAZINE (APRESOLINE) 25 MG tablet Take 1 tablet (25 mg total) by mouth 3 (three)  times daily. 30 tablet 3  . hydrOXYzine (ATARAX/VISTARIL) 25 MG tablet Take 1 tablet (25 mg total) by mouth 3 (three) times daily as needed for anxiety or itching. (Patient not taking: Reported on 07/26/2016) 90 tablet 1  . levothyroxine (SYNTHROID, LEVOTHROID) 100 MCG tablet TAKE ONE TABLET BY MOUTH EVERY DAY 90 tablet 0  . loperamide (IMODIUM A-D) 2 MG tablet Take 2 mg by mouth 4 (four) times daily as needed.      Marland Kitchen losartan (COZAAR) 100 MG tablet Take 1 tablet (100 mg total) by mouth daily. 30 tablet 3  . metoprolol tartrate (LOPRESSOR) 25 MG tablet TAKE ONE TABLET BY MOUTH TWICE DAILY 60 tablet 12  . omeprazole (PRILOSEC) 40 MG capsule TAKE ONE CAPSULE BY MOUTH TWICE A DAY. BEFORE BREAKFAST AND SUPPER 60 capsule 5  . Spacer/Aero-Holding Chambers (OPTICHAMBER ADVANTAGE) MISC 1 each by Other route once. Always uses her when you're using a metered-dose inhaler. You've aromatase medicine as much, he won't have his much side effect, but you it twice as much medicine and your lungs. 1 each 0  . albuterol (PROVENTIL HFA;VENTOLIN HFA) 108 (90 BASE) MCG/ACT inhaler Inhale 2 puffs into the lungs every 6 (six) hours as needed for wheezing or shortness of breath. 1 Inhaler 2  . DiphenhydrAMINE HCl (BENADRYL ALLERGY  PO) Take 1 tablet by mouth at bedtime. Take by mouth as needed.    . pantoprazole (PROTONIX) 40 MG tablet TAKE 1 TABLET BY MOUTH TWICE DAILY BEFORE A MEAL 60 tablet 5  . VITAMIN D, ERGOCALCIFEROL, PO Take 1.25 mg by mouth every 30 (thirty) days.    . fluticasone (FLONASE) 50 MCG/ACT nasal spray Place 2 sprays into the nose daily. 16 g 6  . Infant Care Products (DERMACLOUD) CREA Apply liberally to  Vaginal folds. 430 g 0  . pramipexole (MIRAPEX) 0.125 MG tablet Take 1 tablet (0.125 mg total) by mouth 3 (three) times daily. 30 tablet 11  . PROAIR HFA 108 (90 BASE) MCG/ACT inhaler INHALE 2 PUFFS EVERY 6 HOURS AS NEEDED FOR WHEEZING 8.5 g 3  . amoxicillin-clavulanate (AUGMENTIN) 875-125 MG tablet  Take 1 tablet by mouth 2 (two) times daily. (Patient not taking: Reported on 07/26/2016) 14 tablet 0  . fexofenadine (ALLEGRA) 180 MG tablet Take 1 tablet (180 mg total) by mouth every morning. (Patient not taking: Reported on 07/26/2016) 30 tablet 2  . furosemide (LASIX) 40 MG tablet TAKE ONE TABLET BY MOUTH EVERY DAY (Patient not taking: Reported on 07/26/2016) 30 tablet 5  . guaiFENesin-codeine (CHERATUSSIN AC) 100-10 MG/5ML syrup Take 5 mLs by mouth 3 (three) times daily as needed for cough. (Patient not taking: Reported on 07/26/2016) 120 mL 0  . ondansetron (ZOFRAN) 4 MG tablet Take 4 mg by mouth every 4 (four) hours as needed. For nausea and vomiting    . predniSONE (DELTASONE) 10 MG tablet 6 tablets on Day 1 , then reduce by 1 tablet daily until gone (Patient not taking: Reported on 07/26/2016) 21 tablet 0  . predniSONE (DELTASONE) 20 MG tablet 6 tablets all at once on Day 1,  Then taper by 1 tablet daily until gone (Patient not taking: Reported on 07/26/2016) 21 tablet 0   No facility-administered medications prior to visit.     Review of Systems;  Patient denies headache, fevers, malaise, unintentional weight loss, skin rash, eye pain, sinus congestion and sinus pain, sore throat, dysphagia,  hemoptysis , cough, dyspnea, wheezing, chest pain, palpitations, orthopnea, edema, abdominal pain, nausea, melena, diarrhea, constipation, flank pain, dysuria, hematuria, urinary  Frequency, nocturia, numbness, tingling, seizures,  Focal weakness, Loss of consciousness,  Tremor, insomnia, depression, anxiety, and suicidal ideation.      Objective:  BP 112/60   Pulse 67   Temp 98 F (36.7 C) (Oral)   Resp 14   Ht 5' (1.524 m)   Wt 153 lb (69.4 kg)   SpO2 98%   BMI 29.88 kg/m   BP Readings from Last 3 Encounters:  07/26/16 112/60  06/06/16 140/70  01/20/16 128/60    Wt Readings from Last 3 Encounters:  07/26/16 153 lb (69.4 kg)  06/06/16 152 lb 12.8 oz (69.3 kg)  01/20/16 155 lb  (70.3 kg)    General appearance: alert, cooperative and appears stated age Ears: normal TM's and external ear canals both ears Throat: lips, mucosa, and tongue normal; teeth and gums normal Neck: no adenopathy, no carotid bruit, supple, symmetrical, trachea midline and thyroid not enlarged, symmetric, no tenderness/mass/nodules Back: symmetric, no curvature. ROM normal. No CVA tenderness. Lungs: clear to auscultation bilaterally Heart: regular rate and rhythm, S1, S2 normal, no murmur, click, rub or gallop Abdomen: soft, non-tender; bowel sounds normal; no masses,  no organomegaly Pulses: 2+ and symmetric Skin: Skin color, texture, turgor normal. No rashes or lesions Lymph nodes: Cervical, supraclavicular, and axillary  nodes normal.  Lab Results  Component Value Date   HGBA1C 5.0 06/16/2016    Lab Results  Component Value Date   CREATININE 1.59 (H) 06/16/2016   CREATININE 1.52 (H) 12/24/2015   CREATININE 1.53 (H) 06/08/2015    Lab Results  Component Value Date   WBC 9.3 06/16/2016   HGB 10.9 (L) 06/16/2016   HCT 33.1 (L) 06/16/2016   PLT 214.0 06/16/2016   GLUCOSE 96 06/16/2016   CHOL 124 06/16/2016   TRIG 87.0 06/16/2016   HDL 42.50 06/16/2016   LDLCALC 64 06/16/2016   ALT 8 12/24/2015   AST 12 12/24/2015   NA 138 06/16/2016   K 3.8 06/16/2016   CL 102 06/16/2016   CREATININE 1.59 (H) 06/16/2016   BUN 33 (H) 06/16/2016   CO2 28 06/16/2016   TSH 1.92 06/16/2016   HGBA1C 5.0 06/16/2016    Dg Chest 2 View  Result Date: 06/08/2015 CLINICAL DATA:  Cough since last week productive with yellow sputum, chest pain, personal history of hypertension, asthma, pancreatitis EXAM: CHEST  2 VIEW COMPARISON:  10/26/2012 FINDINGS: Borderline enlargement of cardiac silhouette. Atherosclerotic calcification aorta. Moderate-sized hiatal hernia. Mediastinal contours and pulmonary vascularity otherwise normal. Lungs minimally hyperinflated but clear. Small bibasilar effusions blunt the  posterior costophrenic angle. No pneumothorax. Bones demineralized with scattered endplate spur formation thoracic spine. IMPRESSION: Small bibasilar pleural effusions. Moderate-sized hiatal hernia. Electronically Signed   By: Lavonia Dana M.D.   On: 06/08/2015 13:16    Assessment & Plan:   Problem List Items Addressed This Visit    Atherosclerosis of aorta [facility])    Noted on October chest x ray .  Lipoids are at goal without statin therapy , and given her age, will not advise starting.  Will recommend baby aspirin daily .  Lab Results  Component Value Date   CHOL 124 06/16/2016   HDL 42.50 06/16/2016   LDLCALC 64 06/16/2016   TRIG 87.0 06/16/2016   CHOLHDL 3 06/16/2016         Hypertension    Regimen confirmed.  Patient advised to suspend metoprolol due to recurrent hypotension       Hypothyroidism    Thyroid function is WNL on current dose.  No current changes needed.   Lab Results  Component Value Date   TSH 1.92 06/16/2016           A total of 25 minutes of face to face time was spent with patient more than half of which was spent in counselling about the above mentioned conditions  and coordination of care   I am having Ms. Swiech maintain her loperamide, acetaminophen, losartan, hydrALAZINE, cyanocobalamin, Beclomethasone Dipropionate, fluticasone, pramipexole, DERMACLOUD, PROAIR HFA, OPTICHAMBER ADVANTAGE, hydrOXYzine, metoprolol tartrate, ALPRAZolam, DULERA, levothyroxine, furosemide, and omeprazole.  No orders of the defined types were placed in this encounter.   Medications Discontinued During This Encounter  Medication Reason  . furosemide (LASIX) 40 MG tablet Error    Follow-up: No Follow-up on file.   [name], MD

## 2016-07-26 NOTE — Telephone Encounter (Signed)
First 30 minutes before breakfast patient taking levothyroxine 100 mcg with omeprazole 40 mg   After breakfast patient is taking Vit d3 1000 units, B 12 1000 mcg , hydralazine 25 mg, lasix 40 mg,  Metoprolol tartrate 25 mg (bid)  After dinner patient is taking hydralazine 50 mg, metoprolol 25 mg,  QHS patient takes losartan 100 mg , alprazolam 0.5 mg  Patient medicationd per patient with regimen, as requested by pcp  Patient is using her dulera inhaler PRN not twice daily?

## 2016-07-26 NOTE — Telephone Encounter (Signed)
Patient advised of below of below and verbalized an understanding.

## 2016-07-26 NOTE — Patient Instructions (Addendum)
I agree that we can reduce your blood pressure medications,  But I need to be sure about what you are taking   Please  Bring back all your meds that you are currently taking and I will adjust the blood pressure medications   You have excellent cholesterol levels in spite of your diet,  But since  You have atherosclerosis on your chest  Xray,     I recommend taking a baby aspirin  every other day .

## 2016-07-26 NOTE — Telephone Encounter (Signed)
Have her stop the metoprolol, both doses, and follow BP  She needs to take the omeprazole at  A different time than the thyroid medication per endocrinology.  She can take it 30 minutes before dinner instead

## 2016-07-27 NOTE — Assessment & Plan Note (Signed)
Thyroid function is WNL on current dose.  No current changes needed.   Lab Results  Component Value Date   TSH 1.92 06/16/2016

## 2016-07-27 NOTE — Assessment & Plan Note (Signed)
Regimen confirmed.  Patient advised to suspend metoprolol due to recurrent hypotension

## 2016-08-03 ENCOUNTER — Ambulatory Visit (INDEPENDENT_AMBULATORY_CARE_PROVIDER_SITE_OTHER): Payer: Medicare Other

## 2016-08-03 VITALS — BP 108/66 | HR 95 | Resp 18

## 2016-08-03 DIAGNOSIS — I10 Essential (primary) hypertension: Secondary | ICD-10-CM | POA: Diagnosis not present

## 2016-08-03 NOTE — Progress Notes (Signed)
Patient comes in for blood pressure check after discontinuing metoprolol.   She complains of a little headache today.   She brings in blood pressure readings from home as follows  Dec 20 130/62 12:30 pm Dec 21st 135/83 9:30 am Dec 22nd 137/66 9:50am Dec 23rd 115/60 10:30am 139/63 9:25 pm Dec 24th 123/52 10:00am Dec 25 10:05 am 144/65  Dec 26th 128/65 3:45 pm  Please advise

## 2016-08-04 NOTE — Progress Notes (Signed)
Tammy daughter advised of below she will notify patient.

## 2016-08-04 NOTE — Progress Notes (Signed)
   I have reviewed the above information and agree with above. Blood pressures are fine since d/cing metoprolol.   Deborra Medina, MD

## 2016-08-05 ENCOUNTER — Telehealth: Payer: Self-pay | Admitting: Internal Medicine

## 2016-08-05 MED ORDER — LOSARTAN POTASSIUM 100 MG PO TABS: 100.0000 mg | ORAL_TABLET | Freq: Every day | ORAL | 3 refills | Status: DC

## 2016-08-05 NOTE — Telephone Encounter (Signed)
Pt called and is requesting a refill on her losartan (COZAAR) 100 MG tablet, she is completely out. Please advise, thank you!  [place], [place]  Call pt @ [phone]

## 2016-08-05 NOTE — Telephone Encounter (Signed)
Medication has been refilled.

## 2016-08-15 ENCOUNTER — Other Ambulatory Visit: Payer: Self-pay | Admitting: Internal Medicine

## 2016-08-16 NOTE — Telephone Encounter (Signed)
Xanax: refilled 01/20/16 Furosemide:06/16/16 Pt last seen 07/26/16. Please  advise?

## 2016-09-09 ENCOUNTER — Other Ambulatory Visit: Payer: Self-pay | Admitting: Internal Medicine

## 2016-09-21 DIAGNOSIS — D631 Anemia in chronic kidney disease: Secondary | ICD-10-CM | POA: Diagnosis not present

## 2016-09-21 DIAGNOSIS — R809 Proteinuria, unspecified: Secondary | ICD-10-CM | POA: Diagnosis not present

## 2016-09-21 DIAGNOSIS — I129 Hypertensive chronic kidney disease with stage 1 through stage 4 chronic kidney disease, or unspecified chronic kidney disease: Secondary | ICD-10-CM | POA: Diagnosis not present

## 2016-09-21 DIAGNOSIS — N184 Chronic kidney disease, stage 4 (severe): Secondary | ICD-10-CM | POA: Diagnosis not present

## 2016-10-03 ENCOUNTER — Other Ambulatory Visit: Payer: Self-pay | Admitting: Internal Medicine

## 2016-10-03 MED ORDER — ALPRAZOLAM 0.5 MG PO TABS: 0.7500 mg | ORAL_TABLET | Freq: Every evening | ORAL | 5 refills | Status: DC | PRN

## 2016-11-30 ENCOUNTER — Telehealth: Payer: Self-pay | Admitting: Internal Medicine

## 2016-11-30 NOTE — Telephone Encounter (Signed)
Pt needs refills on the following medication for 90 days  -levothyroxine (SYNTHROID, LEVOTHROID) 100 MCG tablet -losartan (COZAAR) 100 MG tablet

## 2016-12-01 MED ORDER — LOSARTAN POTASSIUM 100 MG PO TABS: 100.0000 mg | ORAL_TABLET | Freq: Every day | ORAL | 0 refills | Status: DC

## 2016-12-01 MED ORDER — LEVOTHYROXINE SODIUM 100 MCG PO TABS: 100.0000 ug | ORAL_TABLET | Freq: Every day | ORAL | 0 refills | Status: DC

## 2016-12-01 NOTE — Telephone Encounter (Signed)
Refilled both of these for 90 days.

## 2016-12-13 ENCOUNTER — Other Ambulatory Visit: Payer: Self-pay | Admitting: Internal Medicine

## 2017-01-11 DIAGNOSIS — I129 Hypertensive chronic kidney disease with stage 1 through stage 4 chronic kidney disease, or unspecified chronic kidney disease: Secondary | ICD-10-CM | POA: Diagnosis not present

## 2017-01-11 DIAGNOSIS — N184 Chronic kidney disease, stage 4 (severe): Secondary | ICD-10-CM | POA: Diagnosis not present

## 2017-01-11 DIAGNOSIS — D631 Anemia in chronic kidney disease: Secondary | ICD-10-CM | POA: Diagnosis not present

## 2017-01-24 ENCOUNTER — Ambulatory Visit (INDEPENDENT_AMBULATORY_CARE_PROVIDER_SITE_OTHER): Payer: PPO | Admitting: Internal Medicine

## 2017-01-24 ENCOUNTER — Encounter: Payer: Self-pay | Admitting: Internal Medicine

## 2017-01-24 VITALS — BP 124/66 | HR 101 | Temp 98.4°F | Resp 17 | Ht 60.0 in | Wt 153.2 lb

## 2017-01-24 DIAGNOSIS — I4891 Unspecified atrial fibrillation: Secondary | ICD-10-CM

## 2017-01-24 DIAGNOSIS — F4321 Adjustment disorder with depressed mood: Secondary | ICD-10-CM

## 2017-01-24 DIAGNOSIS — I499 Cardiac arrhythmia, unspecified: Secondary | ICD-10-CM | POA: Diagnosis not present

## 2017-01-24 DIAGNOSIS — N184 Chronic kidney disease, stage 4 (severe): Secondary | ICD-10-CM | POA: Diagnosis not present

## 2017-01-24 DIAGNOSIS — Z634 Disappearance and death of family member: Secondary | ICD-10-CM | POA: Diagnosis not present

## 2017-01-24 MED ORDER — METOPROLOL TARTRATE 25 MG PO TABS: 12.5000 mg | ORAL_TABLET | Freq: Two times a day (BID) | ORAL | 12 refills | Status: DC

## 2017-01-24 MED ORDER — TEMAZEPAM 15 MG PO CAPS: 15.0000 mg | ORAL_CAPSULE | Freq: Every evening | ORAL | 0 refills | Status: DC | PRN

## 2017-01-24 NOTE — Patient Instructions (Addendum)
Your nighttime  cough may be due to "Post nasal drip".  I would Continue using flonase daily  And You can try add benadryl (dipenhydramine, 12.5 to 25 mg) taken at bedtime .    We discussed  trying a different sleeping medication  called Restoril,  You can Take it 30 minutes before bedtime,    Ok to add the 12.5 mg benadryl to this  But TRY THE BENADRYL FIRST before you change the alprazolam to restoril   Your heart beat was irregular today so I ordered an EKG  And you are in atrial fib  We are resuming a low dose of metoprolol  12 5 mg twice daily to control your heart rate  You need to check your blood pressure at home once daily and let me know if your pressure drops below 120 because we will reduce your losartan dose if necessary  You may need to start a medication to prevent strokes.  I would like you to see a cardiologist to determine if this is necessary

## 2017-01-24 NOTE — Progress Notes (Signed)
**Note Savannah-Identified via Obfuscation** Subjective:  Patient ID: Savannah Wilson, female    DOB: 15-Apr-1929  Age: 83 y.o. MRN: [id number]  CC: The primary encounter diagnosis was Irregular heart beat. Diagnoses of Atrial fibrillation, unspecified type ([place]), Grief at loss of child, and CKD (chronic kidney disease), stage IV ([place]) were also pertinent to this visit.   HPI Savannah Wilson presents for follow up on hypertension, CKD ,asthma/COPD ,  GAD complicated by grief over the loss of her daughter Savannah Wilson  She is tearful today at the mention of Savannah Wilson's name. Having nightly  insomnia depite use of alprazolam.  Lives alone, Misses getting  Her lunchtime and bedtime calls from Savannah Wilson .  Discussed trial of restoril .   Some coughing occurring every night, NO REFLUX OR ORTHOPNEA.   Coughs a bit in the morning to clear  out the Morning phlegm  But then fine for the rest of the day.  has not tried evening benadryl.     Outpatient Medications Prior to Visit  Medication Sig Dispense Refill  . acetaminophen (TYLENOL) 650 MG CR tablet Take 650 mg by mouth every 8 (eight) hours as needed.    . ALPRAZolam (XANAX) 0.5 MG tablet Take 1.5 tablets (0.75 mg total) by mouth at bedtime as needed for anxiety. 30 tablet 5  . Beclomethasone Dipropionate 80 MCG/ACT AERS Place 2 sprays into the nose daily. 8.7 g 11  . Cholecalciferol (VITAMIN D-3) 1000 units CAPS Take 1 capsule by mouth daily.    . cyanocobalamin ([employer] VITAMIN B12) 2000 MCG tablet Take 1 tablet (2,000 mcg total) by mouth daily. 30 tablet 1  . DULERA 100-5 MCG/ACT AERO USE 2 PUFFS TWICE DAILY. 13 g 3  . fluticasone (FLONASE) 50 MCG/ACT nasal spray Place 2 sprays into the nose daily. 16 g 6  . furosemide (LASIX) 40 MG tablet TAKE ONE TABLET BY MOUTH EVERY DAY 90 tablet 0  . hydrALAZINE (APRESOLINE) 25 MG tablet Take 1 tablet (25 mg total) by mouth 3 (three) times daily. 30 tablet 3  . hydrOXYzine (ATARAX/VISTARIL) 25 MG tablet Take 1 tablet (25 mg total) by mouth 3 (three) times daily as needed for  anxiety or itching. 90 tablet 1  . Infant Care Products (DERMACLOUD) CREA Apply liberally to  Vaginal folds. 430 g 0  . levothyroxine (SYNTHROID, LEVOTHROID) 100 MCG tablet Take 1 tablet (100 mcg total) by mouth daily. 90 tablet 0  . loperamide (IMODIUM A-D) 2 MG tablet Take 2 mg by mouth 4 (four) times daily as needed.      Marland Kitchen losartan (COZAAR) 100 MG tablet Take 1 tablet (100 mg total) by mouth daily. 90 tablet 0  . omeprazole (PRILOSEC) 40 MG capsule TAKE ONE CAPSULE BY MOUTH TWICE A DAY. BEFORE BREAKFAST AND SUPPER 60 capsule 5  . pramipexole (MIRAPEX) 0.125 MG tablet Take 1 tablet (0.125 mg total) by mouth 3 (three) times daily. 30 tablet 11  . PROAIR HFA 108 (90 BASE) MCG/ACT inhaler INHALE 2 PUFFS EVERY 6 HOURS AS NEEDED FOR WHEEZING 8.5 g 3  . Spacer/Aero-Holding Chambers (OPTICHAMBER ADVANTAGE) MISC 1 each by Other route once. Always uses her when you're using a metered-dose inhaler. You've aromatase medicine as much, he won't have his much side effect, but you it twice as much medicine and your lungs. 1 each 0  . metoprolol tartrate (LOPRESSOR) 25 MG tablet TAKE ONE TABLET BY MOUTH TWICE DAILY 60 tablet 12   No facility-administered medications prior to visit.     Review of  Systems;  Patient denies headache, fevers, malaise, unintentional weight loss, skin rash, eye pain, sinus congestion and sinus pain, sore throat, dysphagia,  hemoptysis ,  dyspnea, wheezing, chest pain, palpitations, orthopnea, edema, abdominal pain, nausea, melena, diarrhea, constipation, flank pain, dysuria, hematuria, urinary  Frequency, nocturia, numbness, tingling, seizures,  Focal weakness, Loss of consciousness,  Tremor, anxiety, and suicidal ideation.      Objective:  BP 124/66 (BP Location: Left Arm, Patient Position: Sitting, Cuff Size: Normal)   Pulse (!) 101   Temp 98.4 F (36.9 C) (Oral)   Resp 17   Ht 5' (1.524 m)   Wt 153 lb 3.2 oz (69.5 kg)   SpO2 97%   BMI 29.92 kg/m   BP Readings from  Last 3 Encounters:  01/24/17 124/66  08/03/16 108/66  07/26/16 112/60    Wt Readings from Last 3 Encounters:  01/24/17 153 lb 3.2 oz (69.5 kg)  07/26/16 153 lb (69.4 kg)  06/06/16 152 lb 12.8 oz (69.3 kg)    General appearance: alert, cooperative and appears stated age Neck: no adenopathy, no carotid bruit, supple, symmetrical, trachea midline and thyroid not enlarged, symmetric, no tenderness/mass/nodules Back: symmetric, no curvature. ROM normal. No CVA tenderness. Lungs: clear to auscultation bilaterally Heart: irreg irreg, S1, S2 normal, no murmur, click, rub or gallop Abdomen: soft, non-tender; bowel sounds normal; no masses,  no organomegaly Pulses: 2+ and symmetric Skin: Skin color, texture, turgor normal. No rashes or lesions Lymph nodes: Cervical, supraclavicular, and axillary nodes normal.  Lab Results  Component Value Date   HGBA1C 5.0 06/16/2016    Lab Results  Component Value Date   CREATININE 1.59 (H) 06/16/2016   CREATININE 1.52 (H) 12/24/2015   CREATININE 1.53 (H) 06/08/2015    Lab Results  Component Value Date   WBC 9.3 06/16/2016   HGB 10.9 (L) 06/16/2016   HCT 33.1 (L) 06/16/2016   PLT 214.0 06/16/2016   GLUCOSE 96 06/16/2016   CHOL 124 06/16/2016   TRIG 87.0 06/16/2016   HDL 42.50 06/16/2016   LDLCALC 64 06/16/2016   ALT 8 12/24/2015   AST 12 12/24/2015   NA 138 06/16/2016   K 3.8 06/16/2016   CL 102 06/16/2016   CREATININE 1.59 (H) 06/16/2016   BUN 33 (H) 06/16/2016   CO2 28 06/16/2016   TSH 1.92 06/16/2016   HGBA1C 5.0 06/16/2016    Dg Chest 2 View  Result Date: 06/08/2015 CLINICAL DATA:  Cough since last week productive with yellow sputum, chest pain, personal history of hypertension, asthma, pancreatitis EXAM: CHEST  2 VIEW COMPARISON:  10/26/2012 FINDINGS: Borderline enlargement of cardiac silhouette. Atherosclerotic calcification aorta. Moderate-sized hiatal hernia. Mediastinal contours and pulmonary vascularity otherwise normal.  Lungs minimally hyperinflated but clear. Small bibasilar effusions blunt the posterior costophrenic angle. No pneumothorax. Bones demineralized with scattered endplate spur formation thoracic spine. IMPRESSION: Small bibasilar pleural effusions. Moderate-sized hiatal hernia. Electronically Signed   By: Lavonia Dana M.D.   On: 06/08/2015 13:16    Assessment & Plan:   Problem List Items Addressed This Visit    Grief at loss of child    With insomnia reported .  Trial of restoril.       CKD (chronic kidney disease), stage IV ([facility])    Stable,.  She has no  plans for dialysis given her age      Atrial fibrillation [facility])    New onset noted today , asymptomatic.  Resuming low dose metoprolol 12.5 mg bid.  Referral to Cardiology for  evaluation of as her risk for for embolic CVA  Is 0.7% due to [facility] Score of 4 (age > 73, HTN, female). Risk may be higher but her last known EF was 45% with LVH in 2016 at [facility] clinic        Relevant Medications   metoprolol tartrate (LOPRESSOR) 25 MG tablet   Other Relevant Orders   Ambulatory referral to Cardiology    Other Visit Diagnoses    Irregular heart beat    -  Primary   Relevant Orders   EKG 12-Lead (Completed)    A total of 40 minutes was spent with patient more than half of which was spent in counseling patient on the above mentioned issues , reviewing and explaining recent labs and imaging studies done, and coordination of care. I have changed Ms. Goodin's metoprolol tartrate. I am also having her start on temazepam. Additionally, I am having her maintain her loperamide, acetaminophen, hydrALAZINE, cyanocobalamin, Beclomethasone Dipropionate, fluticasone, pramipexole, DERMACLOUD, PROAIR HFA, OPTICHAMBER ADVANTAGE, hydrOXYzine, DULERA, omeprazole, Vitamin D-3, ALPRAZolam, levothyroxine, losartan, and furosemide.  Meds ordered this encounter  Medications  . temazepam (RESTORIL) 15 MG capsule    Sig: Take 1 capsule (15 mg total) by mouth at  bedtime as needed for sleep.    Dispense:  30 capsule    Refill:  0  . metoprolol tartrate (LOPRESSOR) 25 MG tablet    Sig: Take 0.5 tablets (12.5 mg total) by mouth 2 (two) times daily.    Dispense:  60 tablet    Refill:  12    Medications Discontinued During This Encounter  Medication Reason  . metoprolol tartrate (LOPRESSOR) 25 MG tablet Reorder    Follow-up: No Follow-up on file.   [name], MD

## 2017-01-25 ENCOUNTER — Telehealth: Payer: Self-pay | Admitting: Internal Medicine

## 2017-01-25 ENCOUNTER — Telehealth: Payer: Self-pay

## 2017-01-25 ENCOUNTER — Encounter: Payer: Self-pay | Admitting: Physician Assistant

## 2017-01-25 DIAGNOSIS — I4891 Unspecified atrial fibrillation: Secondary | ICD-10-CM | POA: Insufficient documentation

## 2017-01-25 DIAGNOSIS — Z634 Disappearance and death of family member: Secondary | ICD-10-CM

## 2017-01-25 DIAGNOSIS — F4321 Adjustment disorder with depressed mood: Secondary | ICD-10-CM | POA: Insufficient documentation

## 2017-01-25 NOTE — Assessment & Plan Note (Signed)
With insomnia reported .  Trial of restoril.

## 2017-01-25 NOTE — Telephone Encounter (Signed)
Sending to all providers to see if anyone can work her in.

## 2017-01-25 NOTE — Assessment & Plan Note (Addendum)
New onset noted today , asymptomatic.  Resuming low dose metoprolol 12.5 mg bid.  Referral to Cardiology for evaluation of as her risk for for embolic CVA  Is 5.5% due to [facility] Score of 4 (age > 49, HTN, female). Risk may be higher but her last known EF was 45% with LVH in 2016 at [facility]

## 2017-01-25 NOTE — Telephone Encounter (Signed)
[name]'s office calling to get worked in sooner for new afib and high risk embolic cva .  Can I add onto ryan's scheduled for 2 pm.    Call  [phone] [facility]

## 2017-01-25 NOTE — Telephone Encounter (Signed)
Do you think that September is ok to see the cardiologist or does pt need a sooner appt? Please advise.

## 2017-01-25 NOTE — Telephone Encounter (Signed)
Spoke with Savannah Wilson and Savannah Wilson's daughter to let them know that we will be contacting the cardiologist to let them know that the Savannah Wilson needs to bee sooner per Dr. Derrel Nip. Told them that Melissa would be calling them to let them know of the new appt.

## 2017-01-25 NOTE — Telephone Encounter (Signed)
Pt was concerned about having to wait too long to see the Cardiologist in September. Please advise?  Call pt @ [phone]. Thank you!

## 2017-01-25 NOTE — Assessment & Plan Note (Signed)
Stable,.  She has no  plans for dialysis given her age

## 2017-01-25 NOTE — Telephone Encounter (Signed)
Savannah Wilson,  New onset atrial fib is the reason . Needs ECHO so I can decide how to anticoagulate her

## 2017-01-25 NOTE — Telephone Encounter (Signed)
New patient referral from Dr. Derrel Nip for afib scheduled next available 9/4 added to waitlist .  Patient wants sooner .

## 2017-01-25 NOTE — Telephone Encounter (Signed)
I can see her July 3 at 4:40 PM Okay to [facility]

## 2017-01-25 NOTE — Telephone Encounter (Signed)
Added to Ryan's schedule 6/21 at 2 ok per nancy

## 2017-01-26 ENCOUNTER — Other Ambulatory Visit: Payer: Self-pay | Admitting: Physician Assistant

## 2017-01-26 ENCOUNTER — Ambulatory Visit (INDEPENDENT_AMBULATORY_CARE_PROVIDER_SITE_OTHER): Payer: PPO | Admitting: Physician Assistant

## 2017-01-26 ENCOUNTER — Encounter: Payer: Self-pay | Admitting: Physician Assistant

## 2017-01-26 VITALS — BP 132/72 | HR 98 | Ht 60.0 in | Wt 151.5 lb

## 2017-01-26 DIAGNOSIS — E039 Hypothyroidism, unspecified: Secondary | ICD-10-CM

## 2017-01-26 DIAGNOSIS — I4891 Unspecified atrial fibrillation: Secondary | ICD-10-CM

## 2017-01-26 DIAGNOSIS — E782 Mixed hyperlipidemia: Secondary | ICD-10-CM

## 2017-01-26 DIAGNOSIS — J45909 Unspecified asthma, uncomplicated: Secondary | ICD-10-CM

## 2017-01-26 DIAGNOSIS — D638 Anemia in other chronic diseases classified elsewhere: Secondary | ICD-10-CM

## 2017-01-26 DIAGNOSIS — I10 Essential (primary) hypertension: Secondary | ICD-10-CM | POA: Diagnosis not present

## 2017-01-26 DIAGNOSIS — N184 Chronic kidney disease, stage 4 (severe): Secondary | ICD-10-CM | POA: Diagnosis not present

## 2017-01-26 MED ORDER — METOPROLOL TARTRATE 25 MG PO TABS: 25.0000 mg | ORAL_TABLET | Freq: Two times a day (BID) | ORAL | 12 refills | Status: DC

## 2017-01-26 MED ORDER — APIXABAN 2.5 MG PO TABS: 2.5000 mg | ORAL_TABLET | Freq: Two times a day (BID) | ORAL | 3 refills | Status: DC

## 2017-01-26 NOTE — Progress Notes (Signed)
Cardiology Office Note Date:  01/26/2017  Patient ID:  Savannah Wilson, Savannah Wilson Jan 07, 1929, MRN [id number] PCP:  Savannah Mc, MD  Cardiologist:  New to [facility]    Chief Complaint: New onset Afib  History of Present Illness: Savannah Wilson is a 83 y.o. female with history of CKD stage IV, asthma/COPD with mild confirmed obstructive lung disease by PFT in 2012, HTN, hypothyroidism on replacement therapy, HLD, anemia of chronic disease, venous insufficiency, Barrett's esophagus, and GAD complicated by the grief over the loss of her daughter who presents to the office today to establish as a new patient at the request of Dr. Derrel Nip, MD for evaluation of new onset Afib.   Prior EKGs indicate a baseline bradycardic sinus rate in the 40s to 50s bpm dating back to 2008 with most recent study done prior to her office visit above being 08/2012. Has previously been on metoprolol for HTN, though this had been stopped due to her bradycardia. Per PCP note, a prior EF of 45% has been noted in 2016 at [place], I do not have access to this information at this time.   Patient was seen at PCP office on 6/19 with complaints of palpitations. She was noted to be in new onset A. fib, rate controlled at 91 bpm, rare PVC, nonspecific st/t changes. She was resumed on low-dose metoprolol 12.5 mg bid for rate control. Most recent labs from 06/2016 show TSH normal, K+ 3.8, WBC 9.3, HGB 10.9 (baseline ~ 10-11), PLT 214, A1c 5.0. No prior magnesium.   Patient comes in today for evaluation of the above newly diagnosed A. fib. Patient reports she is unable to feel any palpitations. She and her daughter have noted increased shortness of breath with minimal ambulation such as walking down a hallway in her house. Her shortness of breath doesn't improve after a short rest. She has not noted any increased lower extremity swelling from her baseline nor has she noticed any abdominal distention. No chest pain. Patient's daughter reports she has  noted the patient being short of breath with ambulation on a hallway for approximately the past 6 months. No recent illnesses, trauma, or increased pain. She remains under significant stress from the loss of her daughter. She has tolerated the reinitiation of low-dose metoprolol without issues. She does have some poor dentition that she would hopefully like to have evaluated and worked upon in the near future though states this is not an urgent need nor something she plans to get taking care of right away. She otherwise does not have any planned procedures. She did suffer one isolated fall approximately 5-6 months prior in the setting of tripping over her puppy. She did not suffer any head injury or loss of consciousness. She has not noted any BRBPR, melena, hematuria, or hematemesis. She remains fairly active at baseline.   Past Medical History:  Diagnosis Date  . Asthma   . Atrial fibrillation ([place])    a. diagnosed 01/24/17; CHADS2VASc => 5 (HTN, age x 2, vascular disease with PAD and aortic plaque, female) giving her an estimated annual stroke risk of 6.7%  . Barrett esophagus   . Chronic bronchitis   . CKD (chronic kidney disease), stage III   . Depression   . Diverticular disease   . Diverticulosis   . GERD (gastroesophageal reflux disease)   . Gout   . Hyperlipidemia   . Hypertension   . Hypothyroid   . Pancreatitis due to biliary obstruction March 2010  s/p ERCP sphincterotomy, cholecystectomy  . Peripheral neuralgia   . Venous insufficiency     Past Surgical History:  Procedure Laterality Date  . ABDOMINAL HYSTERECTOMY    . CARPAL TUNNEL RELEASE  jan 2013   Savannah Wilson  . CATARACT EXTRACTION     right  . CATARACT EXTRACTION  2008  . CHOLECYSTECTOMY  02/10  . CHOLECYSTECTOMY  2010  . TONSILLECTOMY    . VENTRAL HERNIA REPAIR    . VENTRAL HERNIA REPAIR  2007  . VESICOVAGINAL FISTULA CLOSURE W/ TAH      Current Meds  Medication Sig  . acetaminophen (TYLENOL) 650 MG CR  tablet Take 650 mg by mouth every 8 (eight) hours as needed.  . ALPRAZolam (XANAX) 0.5 MG tablet Take 1.5 tablets (0.75 mg total) by mouth at bedtime as needed for anxiety.  . Beclomethasone Dipropionate 80 MCG/ACT AERS Place 2 sprays into the nose daily.  . Cholecalciferol (VITAMIN D-3) 1000 units CAPS Take 1 capsule by mouth daily.  . cyanocobalamin ([employer] VITAMIN B12) 2000 MCG tablet Take 1 tablet (2,000 mcg total) by mouth daily.  . DULERA 100-5 MCG/ACT AERO USE 2 PUFFS TWICE DAILY.  . fluticasone (FLONASE) 50 MCG/ACT nasal spray Place 2 sprays into the nose daily.  . furosemide (LASIX) 40 MG tablet TAKE ONE TABLET BY MOUTH EVERY DAY  . hydrALAZINE (APRESOLINE) 25 MG tablet Take 1 tablet (25 mg total) by mouth 3 (three) times daily.  . hydrOXYzine (ATARAX/VISTARIL) 25 MG tablet Take 1 tablet (25 mg total) by mouth 3 (three) times daily as needed for anxiety or itching.  . Dewey (DERMACLOUD) CREA Apply liberally to  Vaginal folds.  Marland Kitchen levothyroxine (SYNTHROID, LEVOTHROID) 100 MCG tablet Take 1 tablet (100 mcg total) by mouth daily.  Marland Kitchen loperamide (IMODIUM A-D) 2 MG tablet Take 2 mg by mouth 4 (four) times daily as needed.    Marland Kitchen losartan (COZAAR) 100 MG tablet Take 1 tablet (100 mg total) by mouth daily.  . metoprolol tartrate (LOPRESSOR) 25 MG tablet Take 0.5 tablets (12.5 mg total) by mouth 2 (two) times daily.  Marland Kitchen omeprazole (PRILOSEC) 40 MG capsule TAKE ONE CAPSULE BY MOUTH TWICE A DAY. BEFORE BREAKFAST AND SUPPER  . pramipexole (MIRAPEX) 0.125 MG tablet Take 1 tablet (0.125 mg total) by mouth 3 (three) times daily.  Marland Kitchen PROAIR HFA 108 (90 BASE) MCG/ACT inhaler INHALE 2 PUFFS EVERY 6 HOURS AS NEEDED FOR WHEEZING  . Spacer/Aero-Holding Chambers (OPTICHAMBER ADVANTAGE) MISC 1 each by Other route once. Always uses her when you're using a metered-dose inhaler. You've aromatase medicine as much, he won't have his much side effect, but you it twice as much medicine and your lungs.  .  temazepam (RESTORIL) 15 MG capsule Take 1 capsule (15 mg total) by mouth at bedtime as needed for sleep.    Allergies:   Ace inhibitors; Benicar [olmesartan medoxomil]; Clarithromycin; Clarithromycin; Norvasc [amlodipine besylate]; Levofloxacin; and Zithromax [azithromycin dihydrate]   Social History:  The patient  reports that she has never smoked. She has never used smokeless tobacco. She reports that she does not drink alcohol or use drugs.   Family History:  The patient's family history includes Cancer in her daughter; Heart disease in her father; Hypertension in her mother; Kidney cancer in her father; Kidney disease in her daughter and mother; Kidney failure in her mother; Multiple myeloma in her daughter; Rheumatologic disease in her father.  ROS:   Review of Systems  Constitutional: Positive for malaise/fatigue. Negative for  chills, diaphoresis, fever and weight loss.  HENT: Negative for congestion.   Eyes: Negative for discharge and redness.  Respiratory: Positive for shortness of breath. Negative for cough, hemoptysis, sputum production and wheezing.   Cardiovascular: Positive for leg swelling. Negative for chest pain, palpitations, orthopnea, claudication and PND.  Gastrointestinal: Negative for abdominal pain, blood in stool, constipation, diarrhea, heartburn, melena, nausea and vomiting.  Genitourinary: Negative for hematuria.  Musculoskeletal: Positive for falls. Negative for myalgias.       Patient suffered one mechanical fall approximately 5-6 months ago in the setting of tripping over her puppy. She did not hit her head or suffer loss of consciousness.  Skin: Negative for rash.  Neurological: Positive for weakness. Negative for dizziness, tingling, tremors, sensory change, speech change, focal weakness and loss of consciousness.  Endo/Heme/Allergies: Does not bruise/bleed easily.  Psychiatric/Behavioral: Negative for substance abuse. The patient is not nervous/anxious.   All  other systems reviewed and are negative.    PHYSICAL EXAM:  VS:  BP 132/72 (BP Location: Right Arm, Patient Position: Sitting, Cuff Size: Normal)   Pulse 98   Ht 5' (1.524 m)   Wt 151 lb 8 oz (68.7 kg)   BMI 29.59 kg/m  BMI: Body mass index is 29.59 kg/m.  Physical Exam  Constitutional: She is oriented to person, place, and time. She appears well-developed and well-nourished.  Tearful at times when she mentions her daughter  HENT:  Head: Normocephalic and atraumatic.  Eyes: Right eye exhibits no discharge. Left eye exhibits no discharge.  Neck: Normal range of motion. No JVD present.  Cardiovascular: Normal rate, S1 normal, S2 normal and normal heart sounds.  An irregularly irregular rhythm present. Exam reveals no distant heart sounds, no friction rub, no midsystolic click and no opening snap.   No murmur heard. Pulses:      Posterior tibial pulses are 2+ on the right side, and 2+ on the left side.  Pulmonary/Chest: Effort normal and breath sounds normal. No respiratory distress. She has no decreased breath sounds. She has no wheezes. She has no rales. She exhibits no tenderness.  Abdominal: Soft. She exhibits no distension. There is no tenderness.  Musculoskeletal: She exhibits edema.  Trace bilateral lower extremity nonpitting edema to the knee  Neurological: She is alert and oriented to person, place, and time.  Skin: Skin is warm and dry. No cyanosis. Nails show no clubbing.  Psychiatric: She has a normal mood and affect. Her speech is normal and behavior is normal. Judgment and thought content normal.     EKG:  Was ordered and interpreted by me today. Shows Afib, 98 bpm, occasional OVC, nonspecific inferior st/t changes  Recent Labs: 06/16/2016: BUN 33; Creatinine, Ser 1.59; Hemoglobin 10.9; Platelets 214.0; Potassium 3.8; Sodium 138; TSH 1.92  06/16/2016: Cholesterol 124; HDL 42.50; LDL Cholesterol 64; Total CHOL/HDL Ratio 3; Triglycerides 87.0; VLDL 17.4   CrCl cannot be  calculated (Patient's most recent lab result is older than the maximum 21 days allowed.).   Wt Readings from Last 3 Encounters:  01/26/17 151 lb 8 oz (68.7 kg)  01/24/17 153 lb 3.2 oz (69.5 kg)  07/26/16 153 lb (69.4 kg)     Other studies reviewed: Additional studies/records reviewed today include: summarized above  ASSESSMENT AND PLAN:  1. New onset Afib: Patient remains in A. fib at today's visit, modestly rate controlled in the upper 90s BPM. Long discussion with patient and her daughter regarding the diagnosis, evaluation, and treatment of atrial fibrillation. We will  increase her Lopressor to 25 mg twice a day along with starting Eliquis 2.5 mg twice a day (patient meets 2 of 3 reduced dosing criteria -age, serum creatinine). We will plan to have the patient adequately anticoagulated without interruption for at least the next 3 weeks followed by further discussion of antiarrhythmic therapy versus electrical cardioversion followed by at least 4 weeks of on and interrupted full dose anticoagulation. Patient does report a history of snoring with prior sleep study several years ago that was nondiagnostic as the patient never early sleep. The sleep study was not repeated. She does not want to proceed with repeat sleep study at any point during this evaluation. It is reasonable, if she fails to maintain sinus rhythm after adequate antiarrhythmic therapy versus cardioversion to keep her in atrial fibrillation as long as she remains rate controlled with exertion, symptom-free, and not volume overloaded. Though, would prefer to at least attempt to restore normal rhythm as above. At this time, it is difficult to determine how long she has been in atrial fibrillation as most recent EKG available for review dates back to 2014 in which she was sinus rhythm with bradycardic rate and patient's daughter reports exertional shortness of breath with minimal ambulation over the past 6 months. The risks and benefits of  starting full dose anticoagulation were discussed in detail with the patient and her daughter who both agree to start Eliquis as above. We also discussed Coumadin and Xarelto with the ultimate decision being made to initiate therapy with Eliquis given the patient's age and comorbidities. She will check her blood pressure and heart rate twice daily at home and notify us of any tachycardic or bradycardic rates. We will check a cmet, tsh, magnesium, and cbc at today's visit with recommendation of repletion of any significant electrolyte abnormalities and correction of any thyroid abnormalities if found. Schedule transthoracic echocardiogram.  2. HTN: Well controlled at this time. Continue metoprolol as above. Continue losartan.  3. CKD stage IV: Followed by Dr. [name] with [facility]. She reports recent renal function panel several weeks prior that showed stable renal function. Check renal function as above. Should her renal function significantly decline, would favor Coumadin over DOAC. However, for now there is no contraindication of DOAC usage in this patient, with data actually indicating DOAC to be safer and have a better stroke prevention than Coumadin.  4. HLD: Recent LDL from 06/2016 of 64. Followed by PCP.   5. Venous insufficiency: Wearing compression hose. Elevates leg when sitting at home. On Lasix 40 mg daily per PCP.  6. Anemia of chronic disease: Check cbc given initiation of anticoagulation.   7. Hypothyroidism: On replacement therapy. Check tsh as above. Per PCP.   8. Asthma/COPD: She reports her asthma is well-controlled at baseline, not needing her albuterol inhaler. Advised her should she note an increase and albuterol usage to let us know and we would recommend changing to Xopenex. Appears stable at this time.  Disposition: F/u with me in 3 weeks to discuss rate versus rhythm control strategies as well as medication management.  Current medicines are reviewed at  length with the patient today.  The patient did not have any concerns regarding medicines.  Melvern Banker PA-C 01/26/2017 2:00 PM     [address], [place] [address] [phone]

## 2017-01-26 NOTE — Patient Instructions (Signed)
Medication Instructions:  Your physician has recommended you make the following change in your medication:  1- INCREASE Metoprolol to 25 mg by mouth twice a day. 2- START Eliquis 2.5 mg by mouth twice a day.   Labwork: Your physician recommends that you return for lab work in: TODAY (CBC, CMET, TSH, Mg).   Testing/Procedures: Your physician has requested that you have an echocardiogram. Echocardiography is a painless test that uses sound waves to create images of your heart. It provides your doctor with information about the size and shape of your heart and how well your heart's chambers and valves are working. This procedure takes approximately one hour. There are no restrictions for this procedure.    Follow-Up: Your physician recommends that you schedule a follow-up appointment in: [place].   If you need a refill on your cardiac medications before your next appointment, please call your pharmacy.   Echocardiogram An echocardiogram, or echocardiography, uses sound waves (ultrasound) to produce an image of your heart. The echocardiogram is simple, painless, obtained within a short period of time, and offers valuable information to your health care provider. The images from an echocardiogram can provide information such as:  Evidence of coronary artery disease (CAD).  Heart size.  Heart muscle function.  Heart valve function.  Aneurysm detection.  Evidence of a past heart attack.  Fluid buildup around the heart.  Heart muscle thickening.  Assess heart valve function.  Tell a health care provider about:  Any allergies you have.  All medicines you are taking, including vitamins, herbs, eye drops, creams, and over-the-counter medicines.  Any problems you or family members have had with anesthetic medicines.  Any blood disorders you have.  Any surgeries you have had.  Any medical conditions you have.  Whether you are pregnant or may be  pregnant. What happens before the procedure? No special preparation is needed. Eat and drink normally. What happens during the procedure?  In order to produce an image of your heart, gel will be applied to your chest and a wand-like tool (transducer) will be moved over your chest. The gel will help transmit the sound waves from the transducer. The sound waves will harmlessly bounce off your heart to allow the heart images to be captured in real-time motion. These images will then be recorded.  You may need an IV to receive a medicine that improves the quality of the pictures. What happens after the procedure? You may return to your normal schedule including diet, activities, and medicines, unless your health care provider tells you otherwise. This information is not intended to replace advice given to you by your health care provider. Make sure you discuss any questions you have with your health care provider. Document Released: 07/22/2000 Document Revised: 03/12/2016 Document Reviewed: 04/01/2013 Elsevier Interactive Patient Education  2017 [employer].

## 2017-01-27 ENCOUNTER — Telehealth: Payer: Self-pay | Admitting: Physician Assistant

## 2017-01-27 LAB — COMPREHENSIVE METABOLIC PANEL
A/G RATIO: 1.8 (ref 1.2–2.2)
ALBUMIN: 4.2 g/dL (ref 3.5–4.7)
ALK PHOS: 130 IU/L — AB (ref 39–117)
ALT: 9 IU/L (ref 0–32)
AST: 16 IU/L (ref 0–40)
BILIRUBIN TOTAL: 0.9 mg/dL (ref 0.0–1.2)
BUN / CREAT RATIO: 24 (ref 12–28)
BUN: 44 mg/dL — ABNORMAL HIGH (ref 8–27)
CHLORIDE: 101 mmol/L (ref 96–106)
CO2: 22 mmol/L (ref 20–29)
Calcium: 9.4 mg/dL (ref 8.7–10.3)
Creatinine, Ser: 1.87 mg/dL — ABNORMAL HIGH (ref 0.57–1.00)
GFR calc non Af Amer: 24 mL/min/{1.73_m2} — ABNORMAL LOW (ref >59)
GFR, EST AFRICAN AMERICAN: 28 mL/min/{1.73_m2} — AB (ref >59)
Globulin, Total: 2.4 g/dL (ref 1.5–4.5)
Glucose: 97 mg/dL (ref 65–99)
POTASSIUM: 4.6 mmol/L (ref 3.5–5.2)
Sodium: 140 mmol/L (ref 134–144)
TOTAL PROTEIN: 6.6 g/dL (ref 6.0–8.5)

## 2017-01-27 LAB — CBC WITH DIFFERENTIAL/PLATELET
BASOS ABS: 0 10*3/uL (ref 0.0–0.2)
Basos: 0 %
EOS (ABSOLUTE): 0.2 10*3/uL (ref 0.0–0.4)
Eos: 2 %
Hematocrit: 34.8 % (ref 34.0–46.6)
Hemoglobin: 10.8 g/dL — ABNORMAL LOW (ref 11.1–15.9)
Immature Grans (Abs): 0 10*3/uL (ref 0.0–0.1)
Immature Granulocytes: 0 %
LYMPHS ABS: 1.4 10*3/uL (ref 0.7–3.1)
Lymphs: 19 %
MCH: 29.5 pg (ref 26.6–33.0)
MCHC: 31 g/dL — AB (ref 31.5–35.7)
MCV: 95 fL (ref 79–97)
MONOS ABS: 0.6 10*3/uL (ref 0.1–0.9)
Monocytes: 7 %
NEUTROS ABS: 5.3 10*3/uL (ref 1.4–7.0)
Neutrophils: 72 %
PLATELETS: 238 10*3/uL (ref 150–379)
RBC: 3.66 x10E6/uL — ABNORMAL LOW (ref 3.77–5.28)
RDW: 14.2 % (ref 12.3–15.4)
WBC: 7.5 10*3/uL (ref 3.4–10.8)

## 2017-01-27 LAB — MAGNESIUM: MAGNESIUM: 2 mg/dL (ref 1.6–2.3)

## 2017-01-27 LAB — TSH: TSH: 2.31 u[IU]/mL (ref 0.450–4.500)

## 2017-01-27 NOTE — Telephone Encounter (Signed)
Patient states that she had forgotten to mention that she was on aspirin three times a week for the last few weeks. She wanted to know if she should continue that. Let her know that we do not have it on our list and that she should just continue the Eliquis and stop the aspirin. She verbalized understanding with no further questions at this time.

## 2017-01-27 NOTE — Telephone Encounter (Signed)
Patient wants to know if she should continue to take asa 81 mg po 3x week since she was started on eliquis . Please call.    Patient states she is going to the beauty shop and please leave a msg if she doesn't answer

## 2017-01-30 ENCOUNTER — Other Ambulatory Visit: Payer: Self-pay

## 2017-01-30 DIAGNOSIS — I482 Chronic atrial fibrillation, unspecified: Secondary | ICD-10-CM

## 2017-01-30 MED ORDER — FUROSEMIDE 40 MG PO TABS: 40.0000 mg | ORAL_TABLET | ORAL | 0 refills | Status: DC

## 2017-02-06 ENCOUNTER — Other Ambulatory Visit: Payer: Self-pay | Admitting: Internal Medicine

## 2017-02-06 ENCOUNTER — Other Ambulatory Visit (INDEPENDENT_AMBULATORY_CARE_PROVIDER_SITE_OTHER): Payer: PPO

## 2017-02-06 DIAGNOSIS — I482 Chronic atrial fibrillation, unspecified: Secondary | ICD-10-CM

## 2017-02-07 LAB — BASIC METABOLIC PANEL
BUN/Creatinine Ratio: 21 (ref 12–28)
BUN: 37 mg/dL — ABNORMAL HIGH (ref 8–27)
CHLORIDE: 103 mmol/L (ref 96–106)
CO2: 21 mmol/L (ref 20–29)
Calcium: 9 mg/dL (ref 8.7–10.3)
Creatinine, Ser: 1.77 mg/dL — ABNORMAL HIGH (ref 0.57–1.00)
GFR, EST AFRICAN AMERICAN: 30 mL/min/{1.73_m2} — AB (ref >59)
GFR, EST NON AFRICAN AMERICAN: 26 mL/min/{1.73_m2} — AB (ref >59)
Glucose: 97 mg/dL (ref 65–99)
POTASSIUM: 4.6 mmol/L (ref 3.5–5.2)
SODIUM: 140 mmol/L (ref 134–144)

## 2017-02-14 ENCOUNTER — Ambulatory Visit (INDEPENDENT_AMBULATORY_CARE_PROVIDER_SITE_OTHER): Payer: PPO | Admitting: Cardiovascular Disease

## 2017-02-14 ENCOUNTER — Encounter: Payer: Self-pay | Admitting: Cardiovascular Disease

## 2017-02-14 ENCOUNTER — Other Ambulatory Visit: Payer: Self-pay

## 2017-02-14 ENCOUNTER — Ambulatory Visit (INDEPENDENT_AMBULATORY_CARE_PROVIDER_SITE_OTHER): Payer: PPO

## 2017-02-14 VITALS — BP 126/60 | HR 88 | Ht 60.0 in | Wt 150.8 lb

## 2017-02-14 DIAGNOSIS — I4891 Unspecified atrial fibrillation: Secondary | ICD-10-CM

## 2017-02-14 DIAGNOSIS — I10 Essential (primary) hypertension: Secondary | ICD-10-CM | POA: Diagnosis not present

## 2017-02-14 DIAGNOSIS — I481 Persistent atrial fibrillation: Secondary | ICD-10-CM | POA: Diagnosis not present

## 2017-02-14 DIAGNOSIS — I4819 Other persistent atrial fibrillation: Secondary | ICD-10-CM

## 2017-02-14 MED ORDER — METOPROLOL TARTRATE 25 MG PO TABS: 25.0000 mg | ORAL_TABLET | Freq: Two times a day (BID) | ORAL | 3 refills | Status: DC

## 2017-02-14 NOTE — Patient Instructions (Signed)
Medication Instructions: Continue same medications.   Labwork: None.   Procedures/Testing: None.   Follow-Up: 6 months with Dr. Arida.   Any Additional Special Instructions Will Be Listed Below (If Applicable).     If you need a refill on your cardiac medications before your next appointment, please call your pharmacy.   

## 2017-02-14 NOTE — Progress Notes (Signed)
Cardiology Office Note   Date:  02/14/2017   ID:  ROCSI HAZELBAKER, DOB 10-31-1928, MRN [id number]  PCP:  [name], MD  Cardiologist:   Kathlyn Sacramento, MD   Chief Complaint  Patient presents with  . other    follow up from ECHO. Patient c/o Chest pain. Meds reviewed verbally with patient.       History of Present Illness: Savannah Wilson is a 83 y.o. female who presents for A follow-up visit regarding atrial fibrillation. She has known history of stage IV chronic kidney disease, asthma/COPD, essential hypertension, hypothyroidism, anemia of chronic disease and Barrett's esophagus. The patient was seen by Christell Faith last month for newly diagnosed atrial fibrillation. The dose of metoprolol was increased to 25 mg twice daily. Eliquis 2.5 mg twice daily was started. She has been doing reasonably well and denies palpitations. She reports exertional  dyspnea which has been stable. She had one episode of chest pain that woke her up from sleep. She reports that metoprolol makes her feel tired. She has known history of leg edema and chronic venous insufficiency.   Past Medical History:  Diagnosis Date  . Asthma   . Atrial fibrillation ([place])    a. diagnosed 01/24/17; CHADS2VASc => 5 (HTN, age x 2, vascular disease with PAD and aortic plaque, female) giving her an estimated annual stroke risk of 6.7%  . Barrett esophagus   . Chronic bronchitis   . CKD (chronic kidney disease), stage III   . Depression   . Diverticular disease   . Diverticulosis   . GERD (gastroesophageal reflux disease)   . Gout   . Hyperlipidemia   . Hypertension   . Hypothyroid   . Pancreatitis due to biliary obstruction March 2010   s/p ERCP sphincterotomy, cholecystectomy  . Peripheral neuralgia   . Venous insufficiency     Past Surgical History:  Procedure Laterality Date  . ABDOMINAL HYSTERECTOMY    . CARPAL TUNNEL RELEASE  jan 2013   [name]  . CATARACT EXTRACTION     right  . CATARACT  EXTRACTION  2008  . CHOLECYSTECTOMY  02/10  . CHOLECYSTECTOMY  2010  . TONSILLECTOMY    . VENTRAL HERNIA REPAIR    . VENTRAL HERNIA REPAIR  2007  . VESICOVAGINAL FISTULA CLOSURE W/ TAH       Current Outpatient Prescriptions  Medication Sig Dispense Refill  . acetaminophen (TYLENOL) 650 MG CR tablet Take 650 mg by mouth every 8 (eight) hours as needed.    . ALPRAZolam (XANAX) 0.5 MG tablet Take 1.5 tablets (0.75 mg total) by mouth at bedtime as needed for anxiety. 30 tablet 5  . apixaban (ELIQUIS) 2.5 MG TABS tablet Take 1 tablet (2.5 mg total) by mouth 2 (two) times daily. 180 tablet 3  . Beclomethasone Dipropionate 80 MCG/ACT AERS Place 2 sprays into the nose daily. 8.7 g 11  . Cholecalciferol (VITAMIN D-3) 1000 units CAPS Take 1 capsule by mouth daily.    . cyanocobalamin ([employer] VITAMIN B12) 2000 MCG tablet Take 1 tablet (2,000 mcg total) by mouth daily. 30 tablet 1  . DULERA 100-5 MCG/ACT AERO USE 2 PUFFS TWICE DAILY. 13 g 3  . fluticasone (FLONASE) 50 MCG/ACT nasal spray Place 2 sprays into the nose daily. 16 g 6  . furosemide (LASIX) 40 MG tablet Take 1 tablet (40 mg total) by mouth every other day. 90 tablet 0  . hydrALAZINE (APRESOLINE) 25 MG tablet Take 1 tablet (25 mg  total) by mouth 3 (three) times daily. 30 tablet 3  . hydrOXYzine (ATARAX/VISTARIL) 25 MG tablet Take 1 tablet (25 mg total) by mouth 3 (three) times daily as needed for anxiety or itching. 90 tablet 1  . Infant Care Products (DERMACLOUD) CREA Apply liberally to  Vaginal folds. 430 g 0  . levothyroxine (SYNTHROID, LEVOTHROID) 100 MCG tablet Take 1 tablet (100 mcg total) by mouth daily. 90 tablet 0  . loperamide (IMODIUM A-D) 2 MG tablet Take 2 mg by mouth 4 (four) times daily as needed.      Marland Kitchen losartan (COZAAR) 100 MG tablet Take 1 tablet (100 mg total) by mouth daily. 90 tablet 0  . metoprolol tartrate (LOPRESSOR) 25 MG tablet Take 1 tablet (25 mg total) by mouth 2 (two) times daily. 180 tablet 3  . omeprazole  (PRILOSEC) 40 MG capsule TAKE ONE CAPSULE BY MOUTH TWICE A DAY. BEFORE BREAKFAST AND SUPPER 180 capsule 1  . pramipexole (MIRAPEX) 0.125 MG tablet Take 1 tablet (0.125 mg total) by mouth 3 (three) times daily. 30 tablet 11  . PROAIR HFA 108 (90 BASE) MCG/ACT inhaler INHALE 2 PUFFS EVERY 6 HOURS AS NEEDED FOR WHEEZING 8.5 g 3  . Spacer/Aero-Holding Chambers (OPTICHAMBER ADVANTAGE) MISC 1 each by Other route once. Always uses her when you're using a metered-dose inhaler. You've aromatase medicine as much, he won't have his much side effect, but you it twice as much medicine and your lungs. 1 each 0  . temazepam (RESTORIL) 15 MG capsule Take 1 capsule (15 mg total) by mouth at bedtime as needed for sleep. 30 capsule 0   No current facility-administered medications for this visit.     Allergies:   Ace inhibitors; Benicar [olmesartan medoxomil]; Clarithromycin; Clarithromycin; Norvasc [amlodipine besylate]; Levofloxacin; and Zithromax [azithromycin dihydrate]    Social History:  The patient  reports that she has never smoked. She has never used smokeless tobacco. She reports that she does not drink alcohol or use drugs.   Family History:  The patient's family history includes Cancer in her daughter; Heart disease in her father; Hypertension in her mother; Kidney cancer in her father; Kidney disease in her daughter and mother; Kidney failure in her mother; Multiple myeloma in her daughter; Rheumatologic disease in her father.    ROS:  Please see the history of present illness.   Otherwise, review of systems are positive for none.   All other systems are reviewed and negative.    PHYSICAL EXAM: VS:  BP 126/60 (BP Location: Left Arm, Patient Position: Sitting, Cuff Size: Normal)   Pulse 88   Ht 5' (1.524 m)   Wt 150 lb 12 oz (68.4 kg)   BMI 29.44 kg/m  , BMI Body mass index is 29.44 kg/m. GEN: Well nourished, well developed, in no acute distress  HEENT: normal  Neck: no JVD, carotid bruits, or  masses Cardiac: Irregularly irregular; no murmurs, rubs, or gallops,no edema  Respiratory:  clear to auscultation bilaterally, normal work of breathing GI: soft, nontender, nondistended, + BS MS: no deformity or atrophy  Skin: warm and dry, no rash Neuro:  Strength and sensation are intact Psych: euthymic mood, full affect   EKG:  EKG is ordered today. The ekg ordered today demonstrates  atrial fibrillation with nonspecific T wave changes.   Recent Labs: 01/26/2017: ALT 9; Hemoglobin 10.8; Magnesium 2.0; Platelets 238; TSH 2.310 02/06/2017: BUN 37; Creatinine, Ser 1.77; Potassium 4.6; Sodium 140    Lipid Panel    Component Value  Date/Time   CHOL 124 06/16/2016 0945   TRIG 87.0 06/16/2016 0945   HDL 42.50 06/16/2016 0945   CHOLHDL 3 06/16/2016 0945   VLDL 17.4 06/16/2016 0945   LDLCALC 64 06/16/2016 0945      Wt Readings from Last 3 Encounters:  02/14/17 150 lb 12 oz (68.4 kg)  01/26/17 151 lb 8 oz (68.7 kg)  01/24/17 153 lb 3.2 oz (69.5 kg)      PAD Screen 01/26/2017  Previous PAD dx? No  Previous surgical procedure? No  Pain with walking? No  Feet/toe relief with dangling? No  Painful, non-healing ulcers? No  Extremities discolored? No      ASSESSMENT AND PLAN:  1.  Persistent atrial fibrillation: The exact duration of this is not entirely clear and could have been more than 6 months. Echocardiogram was done today which showed normal LV systolic function, moderate mitral regurgitation and moderately dilated left atrium. Given the duration and dilated left atrium, I think it will be difficult to get her back in sinus rhythm without an antiarrhythmic medication. Given that she does not appear to be significantly symptomatic from A. fib, it might be reasonable to continue with rate control and anticoagulation. This was discussed with the patient and her daughter and we decided to continue with rate control. Not able to increase the dose of metoprolol given that she  reports fatigue. Calcium channel blockers are not a good option given chronic leg edema and venous insufficiency. Digoxin is also is not a good choice due to advanced chronic kidney disease.  Rate control appears to be reasonable on current dose of metoprolol 25 mg twice daily. She is tolerating anticoagulation with low dose Eliquis.  2. Essential hypertension: Blood pressure is controlled.  3. Chronic kidney disease: Stable and followed by nephrology.   Disposition:   FU with me in 6 months  Signed,  Kathlyn Sacramento, MD  02/14/2017 4:49 PM    [place]

## 2017-02-24 ENCOUNTER — Ambulatory Visit: Payer: PPO | Admitting: Physician Assistant

## 2017-03-02 ENCOUNTER — Other Ambulatory Visit: Payer: Self-pay | Admitting: Internal Medicine

## 2017-03-07 ENCOUNTER — Other Ambulatory Visit: Payer: Self-pay | Admitting: Internal Medicine

## 2017-03-08 ENCOUNTER — Ambulatory Visit: Payer: PPO | Admitting: Physician Assistant

## 2017-03-13 ENCOUNTER — Telehealth: Payer: Self-pay | Admitting: Cardiovascular Disease

## 2017-03-13 NOTE — Telephone Encounter (Signed)
S/w pt who requests to have DCCV. Christell Faith, PA, added eliquis 2.5mg  BID and increased metoprolol to 25mg  BID on June 21 for new afib. Pt has not been monitoring HR but feels SOB r/t afib and would like to proceed with DCCV. States she discussed with Christell Faith, PA and Dr. Fletcher Anon.  Routed to Dr. Fletcher Anon.

## 2017-03-13 NOTE — Telephone Encounter (Signed)
Pt would like a nurse to call her "about shocking my heart".

## 2017-03-14 ENCOUNTER — Telehealth: Payer: Self-pay | Admitting: Cardiovascular Disease

## 2017-03-14 NOTE — Telephone Encounter (Signed)
S/w Dr. Fletcher Anon who is agreeable to DCCV.  Dr. Saunders Revel is able to do DCCV August 21. I have left a detailed message on pt's home VM.

## 2017-03-15 NOTE — Telephone Encounter (Signed)
Received call from patient. She confirmed she is able to have cardioversion on 03/28/17 at 07:30 am by Dr End. She verbalized understanding of the following pre-procedural instructions and will go to the [facility] for labs on 03/21/17. She was very appreciative and will call back if she has any further questions.  You are scheduled for a Cardioversion on _08/21/18___ with Dr.__End___ Please arrive at the [facility] at __6:30__ a.m. on the day of your procedure.  DIET INSTRUCTIONS:  Nothing to eat or drink after midnight except your medications with a              sip of water.         1) Labs: __CBC, BMP, PT/INR (She will go on 03/21/17 for these)___  2) Medications:  YOU MAY TAKE ALL of your remaining medications with a small amount of water.  3) Must have a responsible person to drive you home.  4) Bring a current list of your medications and current insurance cards.    If you have any questions after you get home, please call the office at [phone]

## 2017-03-21 ENCOUNTER — Other Ambulatory Visit
Admission: RE | Admit: 2017-03-21 | Discharge: 2017-03-21 | Disposition: A | Discharge status: Home / Self Care | Payer: PPO | Source: Ambulatory Visit | Attending: Cardiovascular Disease | Admitting: Cardiovascular Disease

## 2017-03-21 ENCOUNTER — Other Ambulatory Visit: Payer: Self-pay | Admitting: *Deleted

## 2017-03-21 DIAGNOSIS — Z01810 Encounter for preprocedural cardiovascular examination: Secondary | ICD-10-CM

## 2017-03-21 DIAGNOSIS — I482 Chronic atrial fibrillation, unspecified: Secondary | ICD-10-CM

## 2017-03-21 LAB — CBC WITH DIFFERENTIAL/PLATELET
BASOS ABS: 0 10*3/uL (ref 0–0.1)
Basophils Relative: 1 %
Eosinophils Absolute: 0.3 10*3/uL (ref 0–0.7)
Eosinophils Relative: 6 %
HEMATOCRIT: 32 % — AB (ref 35.0–47.0)
Hemoglobin: 10.7 g/dL — ABNORMAL LOW (ref 12.0–16.0)
LYMPHS ABS: 1.2 10*3/uL (ref 1.0–3.6)
LYMPHS PCT: 21 %
MCH: 30.9 pg (ref 26.0–34.0)
MCHC: 33.5 g/dL (ref 32.0–36.0)
MCV: 92.2 fL (ref 80.0–100.0)
MONO ABS: 0.5 10*3/uL (ref 0.2–0.9)
Monocytes Relative: 9 %
NEUTROS ABS: 3.5 10*3/uL (ref 1.4–6.5)
Neutrophils Relative %: 63 %
Platelets: 256 10*3/uL (ref 150–440)
RBC: 3.47 MIL/uL — AB (ref 3.80–5.20)
RDW: 14.6 % — AB (ref 11.5–14.5)
WBC: 5.5 10*3/uL (ref 3.6–11.0)

## 2017-03-21 LAB — PROTIME-INR
INR: 1.07
Prothrombin Time: 13.9 seconds (ref 11.4–15.2)

## 2017-03-21 LAB — BASIC METABOLIC PANEL
Anion gap: 8 (ref 5–15)
BUN: 42 mg/dL — ABNORMAL HIGH (ref 6–20)
CO2: 26 mmol/L (ref 22–32)
Calcium: 9.2 mg/dL (ref 8.9–10.3)
Chloride: 107 mmol/L (ref 101–111)
Creatinine, Ser: 1.82 mg/dL — ABNORMAL HIGH (ref 0.44–1.00)
GFR calc Af Amer: 28 mL/min — ABNORMAL LOW (ref >60)
GFR, EST NON AFRICAN AMERICAN: 24 mL/min — AB (ref >60)
GLUCOSE: 103 mg/dL — AB (ref 65–99)
Potassium: 4.6 mmol/L (ref 3.5–5.1)
Sodium: 141 mmol/L (ref 135–145)

## 2017-03-28 ENCOUNTER — Ambulatory Visit: Payer: PPO | Admitting: Nurse Practitioner

## 2017-03-28 ENCOUNTER — Ambulatory Visit: Payer: PPO | Admitting: Anesthesiology

## 2017-03-28 ENCOUNTER — Encounter
Admission: RE | Disposition: A | Discharge status: Home / Self Care | Payer: Self-pay | Source: Ambulatory Visit | Attending: Internal Medicine

## 2017-03-28 ENCOUNTER — Ambulatory Visit
Admission: RE | Admit: 2017-03-28 | Discharge: 2017-03-28 | Disposition: A | Discharge status: Home / Self Care | Payer: PPO | Source: Ambulatory Visit | Attending: Internal Medicine | Admitting: Internal Medicine

## 2017-03-28 DIAGNOSIS — K219 Gastro-esophageal reflux disease without esophagitis: Secondary | ICD-10-CM | POA: Diagnosis not present

## 2017-03-28 DIAGNOSIS — J42 Unspecified chronic bronchitis: Secondary | ICD-10-CM | POA: Diagnosis not present

## 2017-03-28 DIAGNOSIS — Z7982 Long term (current) use of aspirin: Secondary | ICD-10-CM | POA: Insufficient documentation

## 2017-03-28 DIAGNOSIS — I739 Peripheral vascular disease, unspecified: Secondary | ICD-10-CM | POA: Insufficient documentation

## 2017-03-28 DIAGNOSIS — E039 Hypothyroidism, unspecified: Secondary | ICD-10-CM | POA: Diagnosis not present

## 2017-03-28 DIAGNOSIS — J45909 Unspecified asthma, uncomplicated: Secondary | ICD-10-CM | POA: Diagnosis not present

## 2017-03-28 DIAGNOSIS — E785 Hyperlipidemia, unspecified: Secondary | ICD-10-CM | POA: Insufficient documentation

## 2017-03-28 DIAGNOSIS — N184 Chronic kidney disease, stage 4 (severe): Secondary | ICD-10-CM | POA: Insufficient documentation

## 2017-03-28 DIAGNOSIS — I509 Heart failure, unspecified: Secondary | ICD-10-CM | POA: Insufficient documentation

## 2017-03-28 DIAGNOSIS — I4891 Unspecified atrial fibrillation: Secondary | ICD-10-CM | POA: Diagnosis not present

## 2017-03-28 DIAGNOSIS — Z7951 Long term (current) use of inhaled steroids: Secondary | ICD-10-CM | POA: Diagnosis not present

## 2017-03-28 DIAGNOSIS — M109 Gout, unspecified: Secondary | ICD-10-CM | POA: Diagnosis not present

## 2017-03-28 DIAGNOSIS — F329 Major depressive disorder, single episode, unspecified: Secondary | ICD-10-CM | POA: Diagnosis not present

## 2017-03-28 DIAGNOSIS — I481 Persistent atrial fibrillation: Secondary | ICD-10-CM

## 2017-03-28 DIAGNOSIS — I130 Hypertensive heart and chronic kidney disease with heart failure and stage 1 through stage 4 chronic kidney disease, or unspecified chronic kidney disease: Secondary | ICD-10-CM | POA: Insufficient documentation

## 2017-03-28 DIAGNOSIS — Z7901 Long term (current) use of anticoagulants: Secondary | ICD-10-CM | POA: Diagnosis not present

## 2017-03-28 DIAGNOSIS — I872 Venous insufficiency (chronic) (peripheral): Secondary | ICD-10-CM | POA: Insufficient documentation

## 2017-03-28 DIAGNOSIS — I10 Essential (primary) hypertension: Secondary | ICD-10-CM | POA: Diagnosis not present

## 2017-03-28 HISTORY — PX: CARDIOVERSION: EP1203

## 2017-03-28 SURGERY — CARDIOVERSION (CATH LAB)
Anesthesia: General

## 2017-03-28 MED ORDER — PROPOFOL 10 MG/ML IV BOLUS
INTRAVENOUS | Status: DC | PRN
  Administered 2017-03-28: 50 mg via INTRAVENOUS
  Administered 2017-03-28: 20 mg via INTRAVENOUS

## 2017-03-28 MED ORDER — METOPROLOL TARTRATE 25 MG PO TABS: 12.5000 mg | ORAL_TABLET | Freq: Two times a day (BID) | ORAL | 3 refills | Status: DC

## 2017-03-28 MED ORDER — SODIUM CHLORIDE 0.9 % IV SOLN
INTRAVENOUS | Status: DC | PRN
  Administered 2017-03-28: 07:00:00 via INTRAVENOUS

## 2017-03-28 NOTE — Anesthesia Postprocedure Evaluation (Signed)
Anesthesia Post Note  Patient: Savannah Wilson  Procedure(s) Performed: Procedure(s) (LRB): CARDIOVERSION (N/A)  Patient location during evaluation: Other Anesthesia Type: General Level of consciousness: awake and alert Pain management: pain level controlled Vital Signs Assessment: post-procedure vital signs reviewed and stable Respiratory status: spontaneous breathing and respiratory function stable Cardiovascular status: stable Anesthetic complications: no     Last Vitals:  Vitals:   03/28/17 0745 03/28/17 0800  BP: (!) 96/45 (!) 103/47  Pulse: (!) 51   Resp: 18   Temp:    SpO2: 97%     Last Pain:  Vitals:   03/28/17 0656  TempSrc: Oral                 KEPHART,WILLIAM K

## 2017-03-28 NOTE — Anesthesia Preprocedure Evaluation (Signed)
Anesthesia Evaluation  Patient identified by MRN, date of birth, ID band Patient awake    Reviewed: Allergy & Precautions, NPO status , Patient's Chart, lab work & pertinent test results  History of Anesthesia Complications Negative for: history of anesthetic complications  Airway Mallampati: III       Dental   Pulmonary asthma ,           Cardiovascular hypertension, Pt. on medications and Pt. on home beta blockers + Peripheral Vascular Disease and +CHF  + dysrhythmias Atrial Fibrillation      Neuro/Psych Anxiety Depression    GI/Hepatic Neg liver ROS, GERD  Medicated and Controlled,  Endo/Other  Hypothyroidism   Renal/GU Renal InsufficiencyRenal disease     Musculoskeletal   Abdominal   Peds  Hematology   Anesthesia Other Findings   Reproductive/Obstetrics                             Anesthesia Physical Anesthesia Plan  ASA: III  Anesthesia Plan: General   Post-op Pain Management:    Induction:   PONV Risk Score and Plan:   Airway Management Planned:   Additional Equipment:   Intra-op Plan:   Post-operative Plan:   Informed Consent: I have reviewed the patients History and Physical, chart, labs and discussed the procedure including the risks, benefits and alternatives for the proposed anesthesia with the patient or authorized representative who has indicated his/her understanding and acceptance.     Plan Discussed with:   Anesthesia Plan Comments:         Anesthesia Quick Evaluation

## 2017-03-28 NOTE — Transfer of Care (Signed)
Immediate Anesthesia Transfer of Care Note  Patient: Savannah Wilson  Procedure(s) Performed: Procedure(s): CARDIOVERSION (N/A)  Patient Location: spu  Anesthesia Type:General  Level of Consciousness: awake  Airway & Oxygen Therapy: Patient Spontanous Breathing and Patient connected to nasal cannula oxygen  Post-op Assessment: Report given to RN and Post -op Vital signs reviewed and stable  Post vital signs: Reviewed  Last Vitals:  Vitals:   03/28/17 0735 03/28/17 0736  BP:  (!) 98/48  Pulse: (!) 44 (!) 53  Resp: (!) 23 18  Temp:  (!) 36.2 C  SpO2: 97% 97%    Last Pain:  Vitals:   03/28/17 0656  TempSrc: Oral         Complications: No apparent anesthesia complications

## 2017-03-28 NOTE — H&P (Signed)
Cardiology History & Physical    Patient ID: Savannah Wilson MRN: [id number], DOB: 10-Oct-1928 Date of Encounter: 03/28/2017, 7:10 AM Primary Physician: [name], MD Primary Cardiologist: Kathlyn Sacramento, MD  Chief Complaint: Shortness of breath Reason for Admission: Atrial fibrillation  HPI: Savannah Wilson is an 83 y/o woman with history of recently diagnosed peristent atrial fibrillation, CKD stage IV, hypertension, hyperlipidia, and chronic bronchitis, presenting for cardioversion. She presented to her PCP in June and was found to be in atrial fibrillation. She was started on apixaban 2.5 mg BID 2 months ago and has also been maintained on rate control with metoprolol. However, she has continued to have significant shortness of breath that began a few months ago. She also has occasional palpitations. She has been compliant with her medications, including apixaban.  Past Medical History:  Diagnosis Date  . Asthma   . Atrial fibrillation ([place])    a. diagnosed 01/24/17; CHADS2VASc => 5 (HTN, age x 2, vascular disease with PAD and aortic plaque, female) giving her an estimated annual stroke risk of 6.7%  . Barrett esophagus   . Chronic bronchitis   . CKD (chronic kidney disease), stage III   . Depression   . Diverticular disease   . Diverticulosis   . GERD (gastroesophageal reflux disease)   . Gout   . Hyperlipidemia   . Hypertension   . Hypothyroid   . Pancreatitis due to biliary obstruction March 2010   s/p ERCP sphincterotomy, cholecystectomy  . Peripheral neuralgia   . Venous insufficiency      Surgical History:  Past Surgical History:  Procedure Laterality Date  . ABDOMINAL HYSTERECTOMY    . CARPAL TUNNEL RELEASE  jan 2013   [name]  . CATARACT EXTRACTION     right  . CATARACT EXTRACTION  2008  . CHOLECYSTECTOMY  02/10  . CHOLECYSTECTOMY  2010  . TONSILLECTOMY    . VENTRAL HERNIA REPAIR    . VENTRAL HERNIA REPAIR  2007  . VESICOVAGINAL FISTULA CLOSURE W/ TAH        Home Meds: Prior to Admission medications   Medication Sig Start Date Tymesha Ditmore Date Taking? Authorizing Provider  acetaminophen (TYLENOL) 650 MG CR tablet Take 650 mg by mouth every 8 (eight) hours as needed for pain.    Yes [provider]  ALPRAZolam Duanne Moron) 0.5 MG tablet Take 1.5 tablets (0.75 mg total) by mouth at bedtime as needed for anxiety. Patient taking differently: Take 0.5 mg by mouth at bedtime.  10/03/16  Yes [name], MD  apixaban (ELIQUIS) 2.5 MG TABS tablet Take 1 tablet (2.5 mg total) by mouth 2 (two) times daily. 01/26/17  Yes Dunn, Areta Haber, PA-C  aspirin EC 81 MG tablet Take 81 mg by mouth every other day. IN THE EVENING   Yes [provider]  Beclomethasone Dipropionate 80 MCG/ACT AERS Place 2 sprays into the nose daily. Patient taking differently: Place 2 sprays into the nose daily as needed (for respiratory issues.).  01/09/13  Yes [name], MD  Cholecalciferol (VITAMIN D-3) 1000 units CAPS Take 1,000 Units by mouth daily.    Yes [provider]  DULERA 100-5 MCG/ACT AERO USE 2 PUFFS TWICE DAILY. Patient taking differently: USE 2 PUFFS TWICE DAILY AS NEEDED FOR RESPIRATORY ISSUES. 04/22/16  Yes [name], MD  fluticasone (FLONASE) 50 MCG/ACT nasal spray Place 2 sprays into the nose daily. Patient taking differently: Place 2 sprays into the nose daily as needed (For  allergies.).  01/30/13  Yes [name], MD  furosemide (LASIX) 40 MG tablet Take 1 tablet (40 mg total) by mouth every other day. 01/30/17  Yes Dunn, Areta Haber, PA-C  hydrALAZINE (APRESOLINE) 25 MG tablet Take 1 tablet (25 mg total) by mouth 3 (three) times daily. Patient taking differently: Take 25 mg by mouth daily.  01/24/12  Yes [name], MD  hydrALAZINE (APRESOLINE) 50 MG tablet Take 50 mg by mouth at bedtime.   Yes [provider]  levothyroxine (SYNTHROID, LEVOTHROID) 100 MCG tablet TAKE ONE TABLET BY MOUTH EVERY DAY Patient taking differently:  TAKE ONE TABLET BY MOUTH IN THE MORNING 30 MINUTES BEFORE BREAKFAST 03/07/17  Yes [name], MD  loperamide (IMODIUM A-D) 2 MG tablet Take 2 mg by mouth 4 (four) times daily as needed for diarrhea or loose stools.    Yes [provider]  losartan (COZAAR) 100 MG tablet TAKE ONE TABLET BY MOUTH EVERY DAY Patient taking differently: TAKE ONE TABLET BY MOUTH EVERY DAY AT NIGHT 03/03/17  Yes [name], MD  metoprolol tartrate (LOPRESSOR) 25 MG tablet Take 1 tablet (25 mg total) by mouth 2 (two) times daily. 02/14/17  Yes Wellington Hampshire, MD  omeprazole (PRILOSEC) 40 MG capsule TAKE ONE CAPSULE BY MOUTH TWICE A DAY. BEFORE BREAKFAST AND SUPPER Patient taking differently: TAKE ONE CAPSULE BY MOUTH TWICE A DAY. [place] SUPPER 02/06/17  Yes [name], MD  pramipexole (MIRAPEX) 0.125 MG tablet Take 1 tablet (0.125 mg total) by mouth 3 (three) times daily. Patient taking differently: Take 0.125 mg by mouth 3 (three) times daily as needed (for restless legs).  05/28/13  Yes [name], MD  PROAIR HFA 108 (90 BASE) MCG/ACT inhaler INHALE 2 PUFFS EVERY 6 HOURS AS NEEDED FOR WHEEZING 03/13/15  Yes [name], MD  temazepam (RESTORIL) 15 MG capsule Take 1 capsule (15 mg total) by mouth at bedtime as needed for sleep. 01/24/17  Yes [name], MD  vitamin B-12 (CYANOCOBALAMIN) 1000 MCG tablet Take 1,000 mcg by mouth daily.   Yes [provider]  Spacer/Aero-Holding Chambers [facility] ADVANTAGE) MISC 1 each by Other route once. Always uses her when you're using a metered-dose inhaler. You've aromatase medicine as much, he won't have his much side effect, but you it twice as much medicine and your lungs. 06/08/15   Ahmed Prima, MD    Allergies:  Allergies  Allergen Reactions  . Ace Inhibitors Cough  . Benicar [Olmesartan Medoxomil]     Unsure of reaction type  . Clarithromycin Other (See Comments)    Blisters in mouth  . Clarithromycin  Other (See Comments)    Unknown   . Norvasc [Amlodipine Besylate]   . Levofloxacin Rash  . Zithromax [Azithromycin Dihydrate] Rash    Social History   Social History  . Marital status: Widowed    Spouse name: N/A  . Number of children: 2  . Years of education: N/A   Occupational History  . 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 Smoking status: Never Smoker  . Smokeless tobacco: Never Used  . Alcohol use No     Comment: rare  . Drug use: No  . Sexual activity: No   Other Topics Concern  . Not on file   Social History Narrative   ** Merged History Encounter **       Widowed. Has 2 children  and lives alone.     Family History  Problem Relation Age of Onset  . Kidney failure Mother   . Hypertension Mother   . Kidney disease Mother   . Multiple myeloma Daughter   . Cancer Daughter        multiple myeloma  . Kidney disease Daughter   . Heart disease Father   . Rheumatologic disease Father   . Kidney cancer Father     Review of Systems: A 12-system review of systems was performed and is negative except as noted in the HPI.  Labs:   Lab Results  Component Value Date   WBC 5.5 03/21/2017   HGB 10.7 (L) 03/21/2017   HCT 32.0 (L) 03/21/2017   MCV 92.2 03/21/2017   PLT 256 03/21/2017     Recent Labs Lab 03/21/17 0822  NA 141  K 4.6  CL 107  CO2 26  BUN 42*  CREATININE 1.82*  CALCIUM 9.2  GLUCOSE 103*   No results for input(s): CKTOTAL, CKMB, TROPONINI in the last 72 hours. Lab Results  Component Value Date   CHOL 124 06/16/2016   HDL 42.50 06/16/2016   LDLCALC 64 06/16/2016   TRIG 87.0 06/16/2016   No results found for: DDIMER  Radiology/Studies:  No results found. Wt Readings from Last 3 Encounters:  03/28/17 150 lb (68 kg)  02/14/17 150 lb 12 oz (68.4 kg)  01/26/17 151 lb 8 oz (68.7 kg)    EKG: Atrial fibrillation with PVC vs. aberrancy, right axis deviation, and low voltage.  Physical Exam: Blood pressure (!)  149/89, pulse 76, temperature 97.6 F (36.4 C), temperature source Oral, resp. rate 19, height 5' (1.524 m), weight 150 lb (68 kg), SpO2 96 %. Body mass index is 29.29 kg/m. General: Well developed, well nourished, in no acute distress. Head: Normocephalic, atraumatic, sclera non-icteric, no xanthomas, nares are without discharge.  Neck: Negative for carotid bruits. JVD not elevated. Lungs: Normal WOB. Faint expiratory wheezes. No crackles. Heart: Irregularly irregular. No murmurs, rubs, or gallops appreciated. Abdomen: Soft, non-tender, non-distended with normoactive bowel sounds. No hepatomegaly. No rebound/guarding. No obvious abdominal masses. Msk:  Strength and tone appear normal for age. Extremities: No clubbing or cyanosis. No edema.  Distal pedal pulses are 2+ and equal bilaterally. Neuro: Alert and oriented X 3. No focal deficit. No facial asymmetry. Moves all extremities spontaneously. Psych:  Responds to questions appropriately with a normal affect.    Assessment and Plan  83 y/o woman with persistent atrial fibrillation, here for DCCV. EKG today shows rate controlled atrial fibrillation. Savannah Wilson has remained on apixaban without interruption for more than a month. Given continued shortness of breath, she has asked to proceed with direct current cardioversion. The risks of the procedure have been discussed, and we have agreed to proceed.  Verlan Friends Sherrika Weakland MD 03/28/2017, 7:10 AM Pager: [phone]

## 2017-03-28 NOTE — Anesthesia Post-op Follow-up Note (Signed)
Anesthesia QCDR form completed.        

## 2017-03-28 NOTE — CV Procedure (Signed)
    Cardioversion Note  Savannah Wilson [id number] [birth year]  Procedure: DC Cardioversion Indications: Persistent atrial fibrillation  Procedure Details Consent: Obtained Time Out: Verified patient identification, verified procedure, site/side was marked, verified correct patient position, special equipment/implants available, Radiology Safety Procedures followed,  medications/allergies/relevent history reviewed, required imaging and test results available.  Performed  The patient has been on adequate anticoagulation.  The patient received IV propofol via anesthesia for sedation.  Synchronous cardioversion was performed at 120 joules.  The cardioversion was successful with conversion to sinus bradycardia with PACs.  Complications: No apparent complications Patient did tolerate procedure well.  Recommend decreasing metoprolol tartrate to 12.5 mg BID and follow-up with Dr. Fletcher Anon.  Nelva Bush., MD 03/28/2017, 7:26 AM

## 2017-03-29 ENCOUNTER — Encounter: Payer: Self-pay | Admitting: Internal Medicine

## 2017-04-03 ENCOUNTER — Telehealth: Payer: Self-pay | Admitting: Internal Medicine

## 2017-04-03 ENCOUNTER — Other Ambulatory Visit: Payer: Self-pay | Admitting: Internal Medicine

## 2017-04-03 ENCOUNTER — Ambulatory Visit: Payer: PPO

## 2017-04-03 ENCOUNTER — Ambulatory Visit (INDEPENDENT_AMBULATORY_CARE_PROVIDER_SITE_OTHER): Payer: PPO

## 2017-04-03 DIAGNOSIS — K449 Diaphragmatic hernia without obstruction or gangrene: Secondary | ICD-10-CM | POA: Diagnosis not present

## 2017-04-03 DIAGNOSIS — R05 Cough: Secondary | ICD-10-CM | POA: Diagnosis not present

## 2017-04-03 DIAGNOSIS — R058 Other specified cough: Secondary | ICD-10-CM

## 2017-04-03 DIAGNOSIS — R0609 Other forms of dyspnea: Secondary | ICD-10-CM

## 2017-04-03 MED ORDER — AMOXICILLIN-POT CLAVULANATE 875-125 MG PO TABS: 1.0000 | ORAL_TABLET | Freq: Two times a day (BID) | ORAL | 0 refills | Status: DC

## 2017-04-03 NOTE — Telephone Encounter (Signed)
PA submitted on [employer] for [place]  Contact plan to follow up on [id number]

## 2017-04-04 ENCOUNTER — Ambulatory Visit (INDEPENDENT_AMBULATORY_CARE_PROVIDER_SITE_OTHER): Payer: PPO | Admitting: Physician Assistant

## 2017-04-04 ENCOUNTER — Encounter: Payer: Self-pay | Admitting: Physician Assistant

## 2017-04-04 VITALS — BP 150/60 | HR 58 | Ht 60.0 in | Wt 150.0 lb

## 2017-04-04 DIAGNOSIS — I4819 Other persistent atrial fibrillation: Secondary | ICD-10-CM

## 2017-04-04 DIAGNOSIS — N184 Chronic kidney disease, stage 4 (severe): Secondary | ICD-10-CM

## 2017-04-04 DIAGNOSIS — I10 Essential (primary) hypertension: Secondary | ICD-10-CM | POA: Diagnosis not present

## 2017-04-04 DIAGNOSIS — I872 Venous insufficiency (chronic) (peripheral): Secondary | ICD-10-CM

## 2017-04-04 DIAGNOSIS — I481 Persistent atrial fibrillation: Secondary | ICD-10-CM | POA: Diagnosis not present

## 2017-04-04 DIAGNOSIS — Z9889 Other specified postprocedural states: Secondary | ICD-10-CM | POA: Diagnosis not present

## 2017-04-04 MED ORDER — FUROSEMIDE 20 MG PO TABS: 20.0000 mg | ORAL_TABLET | Freq: Every day | ORAL | 3 refills | Status: DC

## 2017-04-04 NOTE — Progress Notes (Signed)
Cardiology Office Note Date:  04/04/2017  Patient ID:  Savannah, Wilson 04-30-1929, MRN [id number] PCP:  [name], MD  Cardiologist:  Dr. Fletcher Anon, MD    Chief Complaint: Follow up Afib  History of Present Illness: Savannah Wilson is a 83 y.o. female with history of recently formally diagnosed Afib in 01/2017 on Eliquis (though uncertain chronicity of Afib) s/p recent successful DCCV on 03/28/2017, CKD stage IV, asthma/COPD with mild confirmed obstructive lung disease by PFT in 2012, HTN, hypothyroidism on replacement therapy, HLD, anemia of chronic disease, venous insufficiency, Barrett's esophagus, and GAD complicated by the grief over the loss of her daughter who presents to the office today for follow-up evaluation of atrial fibrillation.    Prior EKGs indicate a baseline bradycardic sinus rate in the 40s to 50s bpm dating back to 2008 with most recent study done prior to her office visit above being 08/2012. Has previously been on metoprolol for HTN, though this had been stopped due to her bradycardia. Per PCP note, a prior EF of 45% has been noted in 2016 at [facility], records are unavailable for review. Patient was seen at PCP office on 6/19 with complaints of palpitations. She was noted to be in new onset A. fib, rate controlled at 91 bpm, rare PVC, nonspecific st/t changes. She was resumed on low-dose metoprolol 12.5 mg bid for rate control. Review of labs from 06/2016 showed TSH normal, K+ 3.8, WBC 9.3, HGB 10.9 (baseline ~ 10-11), PLT 214, A1c 5.0. No prior magnesium. Patient was evaluated by cardiology on 6/21 and reported she was unable to feel any palpitations. She and her daughter had noted increased shortness of breath with minimal ambulation leading up to her Afib diagnosis. She remained under significant stress from the loss of her daughter. She was started on Eliquis 2.5 mg bid (reduced dosage secondary to age/serum creatinine). Her metoprolol was increased to 25 mg twice a day  given heart rates in the 90s bpm at rest at that time. Laboratory analysis did not reveal etiology of A. fib. Echocardiogram on 02/14/17 showed EF 55-60%, normal wall motion, calcified mitral annulus with moderate regurgitation, moderately dilated left atrium, PASP could not be accurately estimated. Patient followed up with Dr. Fletcher Anon on 02/14/17 and was doing well at that time. Patient wished to proceed with DCCV following that appointment. She underwent successful DCCV on 8/21 with conversion to sinus bradycardia with PACs. Lopressor was decreased to 12.5 mg bid.   Patient comes in doing well from a cardiac standpoint today. She was recently evaluated by PCP the chest x-ray and found to have possible pneumonia and was started on Augmentin. Patient notes cough productive of yellow sputum. Shortness of breath is stable. She denies any chest pain, palpitations, diaphoresis, nausea, vomiting, dizziness, presyncope, or syncope. She recently had to put her dog down. Not checking blood pressure or heart rate at home. Lower extremity swelling stable. She request to change Lasix 40 mg every other day to 20 mg daily. Patient and daughter without any other concerns at this time.   Past Medical History:  Diagnosis Date  . Asthma   . Barrett esophagus   . Chronic bronchitis   . Chronic kidney disease (CKD), stage IV (severe) ([place])   . Depression   . Diverticulosis   . GERD (gastroesophageal reflux disease)   . Gout   . Hyperlipidemia   . Hypertension   . Hypothyroid   . Mitral regurgitation    a. TTE 02/2017:  EF 55-60%, normal WM, calcified mitral annulus with mod regurg, moderately dilated LA, unable to estimate PASP  . Pancreatitis due to biliary obstruction March 2010   s/p ERCP sphincterotomy, cholecystectomy  . Peripheral neuralgia   . Persistent atrial fibrillation ([place])    a. diagnosed 01/24/17; CHADS2VASc => 5 (HTN, age x 2, vascular disease with PAD and aortic plaque, female) giving her an estimated  annual stroke risk of 6.7%; b. successful DCCV 03/28/2017; c. on Eliquis  . Venous insufficiency     Past Surgical History:  Procedure Laterality Date  . ABDOMINAL HYSTERECTOMY    . CARDIOVERSION N/A 03/28/2017   Procedure: CARDIOVERSION;  Surgeon: Nelva Bush, MD;  Location: [facility];  Service: Cardiovascular;  Laterality: N/A;  . CARPAL TUNNEL RELEASE  jan 2013   [name]  . CATARACT EXTRACTION     right  . CATARACT EXTRACTION  2008  . CHOLECYSTECTOMY  02/10  . CHOLECYSTECTOMY  2010  . TONSILLECTOMY    . VENTRAL HERNIA REPAIR    . VENTRAL HERNIA REPAIR  2007  . VESICOVAGINAL FISTULA CLOSURE W/ TAH      Current Meds  Medication Sig  . acetaminophen (TYLENOL) 650 MG CR tablet Take 650 mg by mouth every 8 (eight) hours as needed for pain.   Marland Kitchen ALPRAZolam (XANAX) 0.5 MG tablet Take 1.5 tablets (0.75 mg total) by mouth at bedtime as needed for anxiety. (Patient taking differently: Take 0.5 mg by mouth at bedtime. )  . amoxicillin-clavulanate (AUGMENTIN) 875-125 MG tablet Take 1 tablet by mouth 2 (two) times daily.  Marland Kitchen apixaban (ELIQUIS) 2.5 MG TABS tablet Take 1 tablet (2.5 mg total) by mouth 2 (two) times daily.  Marland Kitchen aspirin EC 81 MG tablet Take 81 mg by mouth every other day. IN THE EVENING  . Beclomethasone Dipropionate 80 MCG/ACT AERS Place 2 sprays into the nose daily. (Patient taking differently: Place 2 sprays into the nose daily as needed (for respiratory issues.). )  . Cholecalciferol (VITAMIN D-3) 1000 units CAPS Take 1,000 Units by mouth daily.   . DULERA 100-5 MCG/ACT AERO USE 2 PUFFS TWICE DAILY. (Patient taking differently: USE 2 PUFFS TWICE DAILY AS NEEDED FOR RESPIRATORY ISSUES.)  . fluticasone (FLONASE) 50 MCG/ACT nasal spray Place 2 sprays into the nose daily. (Patient taking differently: Place 2 sprays into the nose daily as needed (For allergies.). )  . furosemide (LASIX) 40 MG tablet Take 1 tablet (40 mg total) by mouth every other day.  . hydrALAZINE  (APRESOLINE) 25 MG tablet Take 1 tablet (25 mg total) by mouth 3 (three) times daily. (Patient taking differently: Take 25 mg by mouth daily. )  . hydrALAZINE (APRESOLINE) 50 MG tablet Take 50 mg by mouth at bedtime.  Marland Kitchen levothyroxine (SYNTHROID, LEVOTHROID) 100 MCG tablet TAKE ONE TABLET BY MOUTH EVERY DAY (Patient taking differently: TAKE ONE TABLET BY MOUTH IN THE MORNING 30 MINUTES BEFORE BREAKFAST)  . loperamide (IMODIUM A-D) 2 MG tablet Take 2 mg by mouth 4 (four) times daily as needed for diarrhea or loose stools.   Marland Kitchen losartan (COZAAR) 100 MG tablet TAKE ONE TABLET BY MOUTH EVERY DAY (Patient taking differently: TAKE ONE TABLET BY MOUTH EVERY DAY AT NIGHT)  . metoprolol tartrate (LOPRESSOR) 25 MG tablet Take 0.5 tablets (12.5 mg total) by mouth 2 (two) times daily.  Marland Kitchen omeprazole (PRILOSEC) 40 MG capsule TAKE ONE CAPSULE BY MOUTH TWICE A DAY. BEFORE BREAKFAST AND SUPPER (Patient taking differently: TAKE ONE CAPSULE BY MOUTH TWICE A DAY. [place]  MINS BEFORE BREAKFAST AND SUPPER)  . pramipexole (MIRAPEX) 0.125 MG tablet Take 1 tablet (0.125 mg total) by mouth 3 (three) times daily. (Patient taking differently: Take 0.125 mg by mouth 3 (three) times daily as needed (for restless legs). )  . PROAIR HFA 108 (90 BASE) MCG/ACT inhaler INHALE 2 PUFFS EVERY 6 HOURS AS NEEDED FOR WHEEZING  . Spacer/Aero-Holding Chambers (OPTICHAMBER ADVANTAGE) MISC 1 each by Other route once. Always uses her when you're using a metered-dose inhaler. You've aromatase medicine as much, he won't have his much side effect, but you it twice as much medicine and your lungs.  . temazepam (RESTORIL) 15 MG capsule Take 1 capsule (15 mg total) by mouth at bedtime as needed for sleep.  . vitamin B-12 (CYANOCOBALAMIN) 1000 MCG tablet Take 1,000 mcg by mouth daily.    Allergies:   Ace inhibitors; Benicar [olmesartan medoxomil]; Clarithromycin; Clarithromycin; Norvasc [amlodipine besylate]; Levofloxacin; and Zithromax [azithromycin  dihydrate]   Social History:  The patient  reports that she has never smoked. She has never used smokeless tobacco. She reports that she does not drink alcohol or use drugs.   Family History:  The patient's family history includes Cancer in her daughter; Heart disease in her father; Hypertension in her mother; Kidney cancer in her father; Kidney disease in her daughter and mother; Kidney failure in her mother; Multiple myeloma in her daughter; Rheumatologic disease in her father.  ROS:   Review of Systems  Constitutional: Positive for malaise/fatigue. Negative for chills, diaphoresis, fever and weight loss.  HENT: Negative for congestion.   Eyes: Negative for discharge and redness.  Respiratory: Positive for cough, sputum production and shortness of breath. Negative for hemoptysis and wheezing.        Yellow sputum  Cardiovascular: Positive for leg swelling. Negative for chest pain, palpitations, orthopnea, claudication and PND.  Gastrointestinal: Negative for abdominal pain, blood in stool, heartburn, melena, nausea and vomiting.  Genitourinary: Negative for hematuria.  Musculoskeletal: Negative for falls and myalgias.  Skin: Negative for rash.  Neurological: Positive for weakness. Negative for dizziness, tingling, tremors, sensory change, speech change, focal weakness and loss of consciousness.  Endo/Heme/Allergies: Does not bruise/bleed easily.  Psychiatric/Behavioral: Negative for substance abuse. The patient is not nervous/anxious.   All other systems reviewed and are negative.    PHYSICAL EXAM:  VS:  BP (!) 150/60 (BP Location: Left Arm, Patient Position: Sitting, Cuff Size: Normal)   Pulse (!) 58   Ht 5' (1.524 m)   Wt 150 lb (68 kg)   BMI 29.29 kg/m  BMI: Body mass index is 29.29 kg/m.  Physical Exam  Constitutional: She is oriented to person, place, and time. She appears well-developed and well-nourished.  HENT:  Head: Normocephalic and atraumatic.  Eyes: Right eye  exhibits no discharge. Left eye exhibits no discharge.  Neck: Normal range of motion. No JVD present.  Cardiovascular: Regular rhythm, S1 normal, S2 normal and normal heart sounds.  Bradycardia present.  Exam reveals no distant heart sounds, no friction rub, no midsystolic click and no opening snap.   No murmur heard. Pulses:      Posterior tibial pulses are 2+ on the right side, and 2+ on the left side.  Pulmonary/Chest: Effort normal and breath sounds normal. No respiratory distress. She has no decreased breath sounds. She has no wheezes. She has no rales. She exhibits no tenderness.  Abdominal: Soft. She exhibits no distension. There is no tenderness.  Musculoskeletal: She exhibits edema.  Trace bilateral pretibial edema  Neurological: She is alert and oriented to person, place, and time.  Skin: Skin is warm and dry. No cyanosis. Nails show no clubbing.  Psychiatric: She has a normal mood and affect. Her speech is normal and behavior is normal. Judgment and thought content normal.     EKG:  Was ordered and interpreted by me today. Shows sinus bradycardia, 58 bpm, rare PVC, nonspecific ST-T changes  Recent Labs: 01/26/2017: ALT 9; Magnesium 2.0; TSH 2.310 03/21/2017: BUN 42; Creatinine, Ser 1.82; Hemoglobin 10.7; Platelets 256; Potassium 4.6; Sodium 141  06/16/2016: Cholesterol 124; HDL 42.50; LDL Cholesterol 64; Total CHOL/HDL Ratio 3; Triglycerides 87.0; VLDL 17.4   Estimated Creatinine Clearance: 19.4 mL/min (A) (by C-G formula based on SCr of 1.82 mg/dL (H)).   Wt Readings from Last 3 Encounters:  04/04/17 150 lb (68 kg)  03/28/17 150 lb (68 kg)  02/14/17 150 lb 12 oz (68.4 kg)     Other studies reviewed: Additional studies/records reviewed today include: summarized above  ASSESSMENT AND PLAN:  1. Persistent Afib s/p DCCV 03/28/2017: Currently in Sinus rhythm with mildly bradycardic rate in the 50s bpm. Asymptomatic. No further symptoms concerning for atrial fibrillation. Given  patient's underlying possible pneumonia I did discuss with her and her daughter possibility of recurrent atrial fibrillation. Tolerating Lopressor 12.5 mg twice a day without issues. Patient's baseline heart rate is mildly bradycardic in the mid 50s. This precludes, along with current heart rate, further titration of beta blocker. Should she have recurrent atrial fibrillation, would recommend amiodarone loading followed by repeat electrical cardioversion if indicated. CHADS2VASc at least 5 (HTN, age x 2, vascular disease, female). Continue Eliquis 2.5 mg bid (dose adjusted for age and SCr). Check bmet, magnesium, and cbc.  2. HTN: Blood pressure is reasonably controlled given patient is under increased stress with possible pneumonia, grieving from recently put down dog, and tearful over the fact that her shirt is soiled today. His blood pressure consistently runs elevated could consider further titration of antihypertensives  3. Venous insufficiency: Recommend compression hose. Change Lasix to 20 mg daily from 40 mg every other day at patient's request. Elevate legs.   4. CKD stage IV: Check renal function. Followed by nephrology.   5. Acute stress reaction: Patient tearful at times during office visit. Noting grief over recently put down dog as well as her shirt is currently soiled. Recommend follow-up with PCP.  Disposition: F/u with Dr. Fletcher Anon, MD in 3 months.   Current medicines are reviewed at length with the patient today.  The patient did not have any concerns regarding medicines.  Melvern Banker PA-C 04/04/2017 2:26 PM     [address] [place], [place] [address] [phone]

## 2017-04-04 NOTE — Patient Instructions (Signed)
Medication Instructions:  Your physician has recommended you make the following change in your medication:  1. DECREASE Furosemide to 20 mg once daily   Labwork: We will call you with your lab results.    Follow-Up: Your physician recommends that you schedule a follow-up appointment in: 3 months with Dr. Fletcher Anon  It was a pleasure seeing you today here in the office. Please do not hesitate to give [place] a call back if you have any further questions. [place], BSN     Any Other Special Instructions Will Be Listed Below (If Applicable).     If you need a refill on your cardiac medications before your next appointment, please call your pharmacy.

## 2017-04-05 ENCOUNTER — Telehealth: Payer: Self-pay | Admitting: Internal Medicine

## 2017-04-05 LAB — CBC WITH DIFFERENTIAL/PLATELET
BASOS ABS: 0 10*3/uL (ref 0.0–0.2)
Basos: 0 %
EOS (ABSOLUTE): 0.2 10*3/uL (ref 0.0–0.4)
Eos: 3 %
HEMATOCRIT: 32 % — AB (ref 34.0–46.6)
HEMOGLOBIN: 10.1 g/dL — AB (ref 11.1–15.9)
Immature Grans (Abs): 0 10*3/uL (ref 0.0–0.1)
Immature Granulocytes: 0 %
LYMPHS ABS: 0.9 10*3/uL (ref 0.7–3.1)
Lymphs: 12 %
MCH: 29.5 pg (ref 26.6–33.0)
MCHC: 31.6 g/dL (ref 31.5–35.7)
MCV: 94 fL (ref 79–97)
MONOCYTES: 7 %
MONOS ABS: 0.5 10*3/uL (ref 0.1–0.9)
NEUTROS ABS: 5.4 10*3/uL (ref 1.4–7.0)
Neutrophils: 78 %
Platelets: 230 10*3/uL (ref 150–379)
RBC: 3.42 x10E6/uL — AB (ref 3.77–5.28)
RDW: 14 % (ref 12.3–15.4)
WBC: 6.9 10*3/uL (ref 3.4–10.8)

## 2017-04-05 LAB — BASIC METABOLIC PANEL
BUN / CREAT RATIO: 18 (ref 12–28)
BUN: 31 mg/dL — ABNORMAL HIGH (ref 8–27)
CO2: 22 mmol/L (ref 20–29)
CREATININE: 1.74 mg/dL — AB (ref 0.57–1.00)
Calcium: 9.3 mg/dL (ref 8.7–10.3)
Chloride: 105 mmol/L (ref 96–106)
GFR calc Af Amer: 30 mL/min/{1.73_m2} — ABNORMAL LOW (ref >59)
GFR, EST NON AFRICAN AMERICAN: 26 mL/min/{1.73_m2} — AB (ref >59)
GLUCOSE: 94 mg/dL (ref 65–99)
POTASSIUM: 4.7 mmol/L (ref 3.5–5.2)
SODIUM: 141 mmol/L (ref 134–144)

## 2017-04-05 LAB — MAGNESIUM: Magnesium: 1.9 mg/dL (ref 1.6–2.3)

## 2017-04-05 NOTE — Telephone Encounter (Signed)
Patient daughter calling for test results   Please call back

## 2017-04-05 NOTE — Telephone Encounter (Signed)
See results note. Reviewed results with patients daughter per release form.

## 2017-04-06 ENCOUNTER — Telehealth: Payer: Self-pay | Admitting: Internal Medicine

## 2017-04-06 ENCOUNTER — Other Ambulatory Visit: Payer: Self-pay | Admitting: Internal Medicine

## 2017-04-06 ENCOUNTER — Other Ambulatory Visit: Payer: Self-pay

## 2017-04-06 DIAGNOSIS — B370 Candidal stomatitis: Secondary | ICD-10-CM

## 2017-04-06 MED ORDER — NYSTATIN 100000 UNIT/ML MT SUSP: 5.0000 mL | Freq: Four times a day (QID) | OROMUCOSAL | 0 refills | Status: DC

## 2017-04-06 MED ORDER — NYSTATIN 100000 UNIT/GM EX POWD: Freq: Four times a day (QID) | CUTANEOUS | 0 refills | Status: DC

## 2017-04-06 NOTE — Telephone Encounter (Signed)
Received Phone call regarding Nystatin Rx that was sent to pharmacy was powder not suspension.  Resent the Rx for suspension as indicated in Dr. Lupita Dawn note prior. thanks

## 2017-04-06 NOTE — Assessment & Plan Note (Signed)
Secondary to antibiotic use.  Nystatin suspension qid x 7 days

## 2017-04-06 NOTE — Telephone Encounter (Signed)
The purpose of this is for documentation only. Patient being treated for pneumonia starting Monday August 27th based on 4 week history of persistent cough and chest x ray findings.  Developed thrush (per daughter Knute Neu, also a patient).  Nystatin suspension sent to [facility] Thursday morning.  If daughter calls back with problem taking nystatin,  you can call in fluconazole 150 mg daily x 2 days . (patient and daughter are [facility] friend of family and know I am going out of town and that I may not be reachable by phone )

## 2017-04-06 NOTE — Telephone Encounter (Signed)
Noted I will hold in my basket as reference if a call comes in thanks

## 2017-04-11 ENCOUNTER — Ambulatory Visit: Payer: PPO | Admitting: Cardiovascular Disease

## 2017-04-12 ENCOUNTER — Telehealth: Payer: Self-pay | Admitting: Internal Medicine

## 2017-04-12 NOTE — Telephone Encounter (Signed)
Spoke to pt daughter, [name]. She is bringing pt in the office today to see Dr. Derrel Nip at 430pm. Tammy states she will schedule pt for AWV with Denisa at that time.  NOTE* Last AWV 08/06/14; pt can have AWV anytime.

## 2017-04-14 NOTE — Telephone Encounter (Signed)
As of one week out no call regarding this, will close at this time. thanks

## 2017-04-14 NOTE — Telephone Encounter (Signed)
PA was approved for [facility] from 04/04/2017 - 08/07/2017. Pharmacy has been notified.

## 2017-04-15 ENCOUNTER — Encounter: Payer: Self-pay | Admitting: Emergency Medicine

## 2017-04-15 ENCOUNTER — Inpatient Hospital Stay
Admission: EM | Admit: 2017-04-15 | Discharge: 2017-04-19 | DRG: 291 | Disposition: A | Discharge status: Home Health | Payer: PPO | Attending: Specialist | Admitting: Specialist

## 2017-04-15 ENCOUNTER — Emergency Department: Payer: PPO

## 2017-04-15 DIAGNOSIS — K219 Gastro-esophageal reflux disease without esophagitis: Secondary | ICD-10-CM | POA: Diagnosis not present

## 2017-04-15 DIAGNOSIS — Z7982 Long term (current) use of aspirin: Secondary | ICD-10-CM | POA: Diagnosis not present

## 2017-04-15 DIAGNOSIS — R05 Cough: Secondary | ICD-10-CM | POA: Diagnosis not present

## 2017-04-15 DIAGNOSIS — Z7901 Long term (current) use of anticoagulants: Secondary | ICD-10-CM

## 2017-04-15 DIAGNOSIS — I130 Hypertensive heart and chronic kidney disease with heart failure and stage 1 through stage 4 chronic kidney disease, or unspecified chronic kidney disease: Secondary | ICD-10-CM | POA: Diagnosis not present

## 2017-04-15 DIAGNOSIS — Z881 Allergy status to other antibiotic agents status: Secondary | ICD-10-CM | POA: Diagnosis not present

## 2017-04-15 DIAGNOSIS — N179 Acute kidney failure, unspecified: Secondary | ICD-10-CM | POA: Diagnosis not present

## 2017-04-15 DIAGNOSIS — I472 Ventricular tachycardia: Secondary | ICD-10-CM | POA: Diagnosis not present

## 2017-04-15 DIAGNOSIS — N189 Chronic kidney disease, unspecified: Secondary | ICD-10-CM | POA: Diagnosis not present

## 2017-04-15 DIAGNOSIS — I509 Heart failure, unspecified: Secondary | ICD-10-CM | POA: Diagnosis not present

## 2017-04-15 DIAGNOSIS — G629 Polyneuropathy, unspecified: Secondary | ICD-10-CM | POA: Diagnosis present

## 2017-04-15 DIAGNOSIS — Z8249 Family history of ischemic heart disease and other diseases of the circulatory system: Secondary | ICD-10-CM

## 2017-04-15 DIAGNOSIS — E039 Hypothyroidism, unspecified: Secondary | ICD-10-CM | POA: Diagnosis not present

## 2017-04-15 DIAGNOSIS — Z79899 Other long term (current) drug therapy: Secondary | ICD-10-CM

## 2017-04-15 DIAGNOSIS — I110 Hypertensive heart disease with heart failure: Secondary | ICD-10-CM | POA: Diagnosis not present

## 2017-04-15 DIAGNOSIS — N184 Chronic kidney disease, stage 4 (severe): Secondary | ICD-10-CM | POA: Diagnosis present

## 2017-04-15 DIAGNOSIS — Z90710 Acquired absence of both cervix and uterus: Secondary | ICD-10-CM | POA: Diagnosis not present

## 2017-04-15 DIAGNOSIS — R059 Cough, unspecified: Secondary | ICD-10-CM

## 2017-04-15 DIAGNOSIS — Z9841 Cataract extraction status, right eye: Secondary | ICD-10-CM | POA: Diagnosis not present

## 2017-04-15 DIAGNOSIS — R0602 Shortness of breath: Secondary | ICD-10-CM | POA: Diagnosis not present

## 2017-04-15 DIAGNOSIS — E785 Hyperlipidemia, unspecified: Secondary | ICD-10-CM | POA: Diagnosis present

## 2017-04-15 DIAGNOSIS — I481 Persistent atrial fibrillation: Secondary | ICD-10-CM | POA: Diagnosis not present

## 2017-04-15 DIAGNOSIS — J449 Chronic obstructive pulmonary disease, unspecified: Secondary | ICD-10-CM | POA: Diagnosis not present

## 2017-04-15 DIAGNOSIS — I5030 Unspecified diastolic (congestive) heart failure: Secondary | ICD-10-CM | POA: Diagnosis not present

## 2017-04-15 DIAGNOSIS — I2510 Atherosclerotic heart disease of native coronary artery without angina pectoris: Secondary | ICD-10-CM | POA: Diagnosis not present

## 2017-04-15 DIAGNOSIS — I5031 Acute diastolic (congestive) heart failure: Secondary | ICD-10-CM | POA: Diagnosis not present

## 2017-04-15 DIAGNOSIS — I5033 Acute on chronic diastolic (congestive) heart failure: Secondary | ICD-10-CM | POA: Diagnosis not present

## 2017-04-15 DIAGNOSIS — J90 Pleural effusion, not elsewhere classified: Secondary | ICD-10-CM | POA: Diagnosis not present

## 2017-04-15 DIAGNOSIS — I10 Essential (primary) hypertension: Secondary | ICD-10-CM | POA: Diagnosis not present

## 2017-04-15 DIAGNOSIS — I252 Old myocardial infarction: Secondary | ICD-10-CM

## 2017-04-15 DIAGNOSIS — Z9842 Cataract extraction status, left eye: Secondary | ICD-10-CM | POA: Diagnosis not present

## 2017-04-15 DIAGNOSIS — R0600 Dyspnea, unspecified: Secondary | ICD-10-CM | POA: Diagnosis not present

## 2017-04-15 DIAGNOSIS — R918 Other nonspecific abnormal finding of lung field: Secondary | ICD-10-CM | POA: Diagnosis not present

## 2017-04-15 DIAGNOSIS — Z888 Allergy status to other drugs, medicaments and biological substances status: Secondary | ICD-10-CM

## 2017-04-15 DIAGNOSIS — N183 Chronic kidney disease, stage 3 (moderate): Secondary | ICD-10-CM | POA: Diagnosis not present

## 2017-04-15 DIAGNOSIS — R600 Localized edema: Secondary | ICD-10-CM | POA: Diagnosis not present

## 2017-04-15 LAB — URINALYSIS, COMPLETE (UACMP) WITH MICROSCOPIC
Bacteria, UA: NONE SEEN
Bilirubin Urine: NEGATIVE
Glucose, UA: NEGATIVE mg/dL
Ketones, ur: NEGATIVE mg/dL
Leukocytes, UA: NEGATIVE
Nitrite: NEGATIVE
Protein, ur: NEGATIVE mg/dL
Specific Gravity, Urine: 1.004 — ABNORMAL LOW (ref 1.005–1.030)
pH: 5 (ref 5.0–8.0)

## 2017-04-15 LAB — BASIC METABOLIC PANEL
ANION GAP: 8 (ref 5–15)
BUN: 31 mg/dL — ABNORMAL HIGH (ref 6–20)
CALCIUM: 9.2 mg/dL (ref 8.9–10.3)
CO2: 20 mmol/L — ABNORMAL LOW (ref 22–32)
CREATININE: 1.45 mg/dL — AB (ref 0.44–1.00)
Chloride: 109 mmol/L (ref 101–111)
GFR, EST AFRICAN AMERICAN: 37 mL/min — AB (ref >60)
GFR, EST NON AFRICAN AMERICAN: 32 mL/min — AB (ref >60)
Glucose, Bld: 97 mg/dL (ref 65–99)
Potassium: 3.9 mmol/L (ref 3.5–5.1)
Sodium: 137 mmol/L (ref 135–145)

## 2017-04-15 LAB — CBC WITH DIFFERENTIAL/PLATELET
BASOS ABS: 0 10*3/uL (ref 0–0.1)
BASOS PCT: 1 %
EOS PCT: 3 %
Eosinophils Absolute: 0.1 10*3/uL (ref 0–0.7)
HCT: 30.1 % — ABNORMAL LOW (ref 35.0–47.0)
Hemoglobin: 10.2 g/dL — ABNORMAL LOW (ref 12.0–16.0)
Lymphocytes Relative: 23 %
Lymphs Abs: 1.1 10*3/uL (ref 1.0–3.6)
MCH: 30.7 pg (ref 26.0–34.0)
MCHC: 33.9 g/dL (ref 32.0–36.0)
MCV: 90.7 fL (ref 80.0–100.0)
MONO ABS: 0.4 10*3/uL (ref 0.2–0.9)
Monocytes Relative: 8 %
NEUTROS ABS: 3.3 10*3/uL (ref 1.4–6.5)
Neutrophils Relative %: 65 %
PLATELETS: 280 10*3/uL (ref 150–440)
RBC: 3.32 MIL/uL — ABNORMAL LOW (ref 3.80–5.20)
RDW: 13.9 % (ref 11.5–14.5)
WBC: 5 10*3/uL (ref 3.6–11.0)

## 2017-04-15 LAB — TROPONIN I
TROPONIN I: 0.03 ng/mL — AB (ref <0.03)
Troponin I: 0.03 ng/mL (ref <0.03)

## 2017-04-15 LAB — GLUCOSE, CAPILLARY: Glucose-Capillary: 104 mg/dL — ABNORMAL HIGH (ref 65–99)

## 2017-04-15 LAB — BRAIN NATRIURETIC PEPTIDE: B Natriuretic Peptide: 1460 pg/mL — ABNORMAL HIGH (ref 0.0–100.0)

## 2017-04-15 MED ORDER — APIXABAN 2.5 MG PO TABS
2.5000 mg | ORAL_TABLET | Freq: Two times a day (BID) | ORAL | Status: DC
  Administered 2017-04-15 – 2017-04-16 (×3): 2.5 mg via ORAL
  Filled 2017-04-15 (×4): qty 1

## 2017-04-15 MED ORDER — ONDANSETRON HCL 4 MG/2ML IJ SOLN
4.0000 mg | Freq: Four times a day (QID) | INTRAMUSCULAR | Status: DC | PRN
  Administered 2017-04-16: 4 mg via INTRAVENOUS
  Filled 2017-04-15: qty 2

## 2017-04-15 MED ORDER — HYDRALAZINE HCL 20 MG/ML IJ SOLN
10.0000 mg | Freq: Four times a day (QID) | INTRAMUSCULAR | Status: DC | PRN
  Filled 2017-04-15: qty 1

## 2017-04-15 MED ORDER — FUROSEMIDE 10 MG/ML IJ SOLN
20.0000 mg | Freq: Once | INTRAMUSCULAR | Status: AC
  Administered 2017-04-15: 20 mg via INTRAVENOUS
  Filled 2017-04-15: qty 4

## 2017-04-15 MED ORDER — MOMETASONE FURO-FORMOTEROL FUM 100-5 MCG/ACT IN AERO
2.0000 | INHALATION_SPRAY | Freq: Two times a day (BID) | RESPIRATORY_TRACT | Status: DC
  Administered 2017-04-16 – 2017-04-19 (×8): 2 via RESPIRATORY_TRACT
  Filled 2017-04-15: qty 8.8

## 2017-04-15 MED ORDER — NITROGLYCERIN 0.4 MG SL SUBL
0.4000 mg | SUBLINGUAL_TABLET | Freq: Once | SUBLINGUAL | Status: AC
  Administered 2017-04-15: 0.4 mg via SUBLINGUAL
  Filled 2017-04-15: qty 1

## 2017-04-15 MED ORDER — PANTOPRAZOLE SODIUM 40 MG PO TBEC
40.0000 mg | DELAYED_RELEASE_TABLET | Freq: Every day | ORAL | Status: DC
  Administered 2017-04-15 – 2017-04-17 (×3): 40 mg via ORAL
  Filled 2017-04-15 (×3): qty 1

## 2017-04-15 MED ORDER — VITAMIN D3 25 MCG (1000 UNIT) PO TABS
1000.0000 [IU] | ORAL_TABLET | Freq: Every day | ORAL | Status: DC
  Administered 2017-04-15 – 2017-04-19 (×5): 1000 [IU] via ORAL
  Filled 2017-04-15 (×10): qty 1

## 2017-04-15 MED ORDER — HYDRALAZINE HCL 50 MG PO TABS
50.0000 mg | ORAL_TABLET | Freq: Every day | ORAL | Status: DC
  Administered 2017-04-15: 50 mg via ORAL
  Filled 2017-04-15: qty 1

## 2017-04-15 MED ORDER — VITAMIN B-12 1000 MCG PO TABS
1000.0000 ug | ORAL_TABLET | Freq: Every day | ORAL | Status: DC
  Administered 2017-04-15 – 2017-04-19 (×5): 1000 ug via ORAL
  Filled 2017-04-15 (×5): qty 1

## 2017-04-15 MED ORDER — ACETAMINOPHEN 325 MG PO TABS: 650.0000 mg | ORAL_TABLET | Freq: Four times a day (QID) | ORAL | Status: DC | PRN

## 2017-04-15 MED ORDER — TEMAZEPAM 15 MG PO CAPS
15.0000 mg | ORAL_CAPSULE | Freq: Every evening | ORAL | Status: DC | PRN
Start: 2017-04-15 — End: 2017-04-19
  Administered 2017-04-15 – 2017-04-18 (×2): 15 mg via ORAL
  Filled 2017-04-15 (×2): qty 1

## 2017-04-15 MED ORDER — LOPERAMIDE HCL 2 MG PO CAPS: 2.0000 mg | ORAL_CAPSULE | Freq: Four times a day (QID) | ORAL | Status: DC | PRN

## 2017-04-15 MED ORDER — LEVOTHYROXINE SODIUM 100 MCG PO TABS
100.0000 ug | ORAL_TABLET | Freq: Every day | ORAL | Status: DC
  Administered 2017-04-16 – 2017-04-19 (×4): 100 ug via ORAL
  Filled 2017-04-15 (×4): qty 1

## 2017-04-15 MED ORDER — ASPIRIN EC 81 MG PO TBEC
81.0000 mg | DELAYED_RELEASE_TABLET | ORAL | Status: DC
  Administered 2017-04-15: 81 mg via ORAL
  Filled 2017-04-15: qty 1

## 2017-04-15 MED ORDER — ALPRAZOLAM 0.5 MG PO TABS
0.5000 mg | ORAL_TABLET | Freq: Every day | ORAL | Status: DC
  Administered 2017-04-15 – 2017-04-18 (×4): 0.5 mg via ORAL
  Filled 2017-04-15 (×4): qty 1

## 2017-04-15 MED ORDER — ONDANSETRON HCL 4 MG PO TABS: 4.0000 mg | ORAL_TABLET | Freq: Four times a day (QID) | ORAL | Status: DC | PRN

## 2017-04-15 MED ORDER — HYDRALAZINE HCL 20 MG/ML IJ SOLN
10.0000 mg | Freq: Four times a day (QID) | INTRAMUSCULAR | Status: DC | PRN
  Administered 2017-04-15: 10 mg via INTRAVENOUS

## 2017-04-15 MED ORDER — LOSARTAN POTASSIUM 50 MG PO TABS
100.0000 mg | ORAL_TABLET | Freq: Every day | ORAL | Status: DC
  Administered 2017-04-15 – 2017-04-17 (×3): 100 mg via ORAL
  Filled 2017-04-15 (×3): qty 2

## 2017-04-15 MED ORDER — METOPROLOL TARTRATE 25 MG PO TABS
12.5000 mg | ORAL_TABLET | Freq: Two times a day (BID) | ORAL | Status: DC
  Administered 2017-04-15 – 2017-04-16 (×3): 12.5 mg via ORAL
  Filled 2017-04-15 (×3): qty 1

## 2017-04-15 MED ORDER — ACETAMINOPHEN 650 MG RE SUPP: 650.0000 mg | Freq: Four times a day (QID) | RECTAL | Status: DC | PRN

## 2017-04-15 MED ORDER — FLUTICASONE PROPIONATE 50 MCG/ACT NA SUSP
2.0000 | Freq: Every day | NASAL | Status: DC
  Administered 2017-04-15 – 2017-04-19 (×4): 2 via NASAL
  Filled 2017-04-15 (×2): qty 16

## 2017-04-15 MED ORDER — NYSTATIN 100000 UNIT/ML MT SUSP
5.0000 mL | Freq: Four times a day (QID) | OROMUCOSAL | Status: DC
  Administered 2017-04-15 – 2017-04-19 (×15): 500000 [IU] via ORAL
  Filled 2017-04-15 (×16): qty 5

## 2017-04-15 MED ORDER — HYDRALAZINE HCL 25 MG PO TABS
25.0000 mg | ORAL_TABLET | Freq: Every day | ORAL | Status: DC
  Administered 2017-04-16: 25 mg via ORAL
  Filled 2017-04-15: qty 1

## 2017-04-15 MED ORDER — FUROSEMIDE 10 MG/ML IJ SOLN
40.0000 mg | Freq: Every day | INTRAMUSCULAR | Status: DC
  Administered 2017-04-16 – 2017-04-17 (×2): 40 mg via INTRAVENOUS
  Filled 2017-04-15 (×2): qty 4

## 2017-04-15 MED ORDER — PRAMIPEXOLE DIHYDROCHLORIDE 0.25 MG PO TABS
0.1250 mg | ORAL_TABLET | Freq: Three times a day (TID) | ORAL | Status: DC | PRN
  Administered 2017-04-15: 0.125 mg via ORAL
  Filled 2017-04-15: qty 1

## 2017-04-15 NOTE — H&P (Signed)
[facility] [facility] [place] NAME: Savannah Wilson    MR#:  [id number]  DATE OF BIRTH:  02-Nov-1928  DATE OF ADMISSION:  04/15/2017  PRIMARY CARE PHYSICIAN: [name], MD   REQUESTING/REFERRING PHYSICIAN: Dr. Arta Silence  CHIEF COMPLAINT:   Chief Complaint  Patient presents with  . Shortness of Breath    HISTORY OF PRESENT ILLNESS:  Savannah Wilson  is a 83 y.o. female with a known history of asthma and chronic bronchitis, hypertension, GERD, persistent atrial fibrillation status post cardioversion on 03/28/2017, peripheral neuropathy, CKD stage IV, hypothyroidism presents to the hospital secondary to worsening shortness of breath. Patient has had dyspnea for several months now,but worsened in the last week. She was noted to have persistent atrial fibrillation and was successfully cardioverted on 03/28/17. With her dyspnea, she was diagnosed with pneumonia and started on Augmentin. Her Lasix was changed from 40 mg every other day to 20 mg daily about 10 days ago. Patient denies any increased salt intake, no recent travel. She does notice that she is having occasional chest pain and worsening dyspnea especially with minimal exertion. Her orthopnea is at baseline. Denies any cough. Also complains of generalized weakness. No fevers or chills. She is unable to care for herself with her worsening dyspnea and chest pain and so presented to emergency room.BNP is elevated, troponin is negative. She is being admitted for CHF exacerbation.  PAST MEDICAL HISTORY:   Past Medical History:  Diagnosis Date  . Asthma   . Barrett esophagus   . Chronic bronchitis   . Chronic kidney disease (CKD), stage IV (severe) ([place])   . Depression   . Diverticulosis   . GERD (gastroesophageal reflux disease)   . Gout   . Hyperlipidemia   . Hypertension   . Hypothyroid   . Mitral regurgitation    a. TTE 02/2017: EF 55-60%, normal WM, calcified mitral annulus with mod  regurg, moderately dilated LA, unable to estimate PASP  . Pancreatitis due to biliary obstruction March 2010   s/p ERCP sphincterotomy, cholecystectomy  . Peripheral neuralgia   . Persistent atrial fibrillation ([place])    a. diagnosed 01/24/17; CHADS2VASc => 5 (HTN, age x 2, vascular disease with PAD and aortic plaque, female) giving her an estimated annual stroke risk of 6.7%; b. successful DCCV 03/28/2017; c. on Eliquis  . Venous insufficiency     PAST SURGICAL HISTORY:   Past Surgical History:  Procedure Laterality Date  . ABDOMINAL HYSTERECTOMY    . CARDIOVERSION N/A 03/28/2017   Procedure: CARDIOVERSION;  Surgeon: Nelva Bush, MD;  Location: [facility];  Service: Cardiovascular;  Laterality: N/A;  . CARPAL TUNNEL RELEASE  jan 2013   [name]  . CATARACT EXTRACTION     right  . CATARACT EXTRACTION  2008  . CHOLECYSTECTOMY  02/10  . CHOLECYSTECTOMY  2010  . TONSILLECTOMY    . VENTRAL HERNIA REPAIR    . VENTRAL HERNIA REPAIR  2007  . VESICOVAGINAL FISTULA CLOSURE W/ TAH      SOCIAL HISTORY:   Social History  Substance Use Topics  . Smoking status: Never Smoker  . Smokeless tobacco: Never Used  . Alcohol use No     Comment: rare    FAMILY HISTORY:   Family History  Problem Relation Age of Onset  . Kidney failure Mother   . Hypertension Mother   . Kidney disease Mother   . Multiple myeloma Daughter   . Cancer Daughter  multiple myeloma  . Kidney disease Daughter   . Heart disease Father   . Rheumatologic disease Father   . Kidney cancer Father     DRUG ALLERGIES:   Allergies  Allergen Reactions  . Ace Inhibitors Cough  . Benicar [Olmesartan Medoxomil]     Unsure of reaction type  . Clarithromycin Other (See Comments)    Blisters in mouth  . Clarithromycin Other (See Comments)    Unknown   . Norvasc [Amlodipine Besylate]   . Levofloxacin Rash  . Zithromax [Azithromycin Dihydrate] Rash    REVIEW OF SYSTEMS:   Review of Systems    Constitutional: Negative for chills, fever, malaise/fatigue and weight loss.  HENT: Positive for hearing loss. Negative for ear discharge, ear pain, nosebleeds and tinnitus.   Eyes: Negative for blurred vision, double vision and photophobia.  Respiratory: Positive for shortness of breath. Negative for cough, hemoptysis and wheezing.   Cardiovascular: Positive for chest pain, orthopnea and leg swelling. Negative for palpitations.  Gastrointestinal: Negative for abdominal pain, constipation, diarrhea, heartburn, melena, nausea and vomiting.  Genitourinary: Negative for dysuria, frequency and urgency.  Musculoskeletal: Positive for myalgias. Negative for back pain and neck pain.  Skin: Negative for rash.  Neurological: Positive for weakness. Negative for dizziness, tingling, sensory change, speech change, focal weakness and headaches.  Endo/Heme/Allergies: Does not bruise/bleed easily.  Psychiatric/Behavioral: Negative for depression.    MEDICATIONS AT HOME:   Prior to Admission medications   Medication Sig Start Date End Date Taking? Authorizing Provider  acetaminophen (TYLENOL) 650 MG CR tablet Take 650 mg by mouth every 8 (eight) hours as needed for pain.     [provider]  ALPRAZolam Duanne Moron) 0.5 MG tablet Take 1.5 tablets (0.75 mg total) by mouth at bedtime as needed for anxiety. Patient taking differently: Take 0.5 mg by mouth at bedtime.  10/03/16   [name], MD  amoxicillin-clavulanate (AUGMENTIN) 875-125 MG tablet Take 1 tablet by mouth 2 (two) times daily. 04/03/17   [name], MD  apixaban (ELIQUIS) 2.5 MG TABS tablet Take 1 tablet (2.5 mg total) by mouth 2 (two) times daily. 01/26/17   Rise Mu, PA-C  aspirin EC 81 MG tablet Take 81 mg by mouth every other day. IN THE EVENING    [provider]  Beclomethasone Dipropionate 80 MCG/ACT AERS Place 2 sprays into the nose daily. Patient taking differently: Place 2 sprays into the nose daily as needed  (for respiratory issues.).  01/09/13   [name], MD  Cholecalciferol (VITAMIN D-3) 1000 units CAPS Take 1,000 Units by mouth daily.     [provider]  DULERA 100-5 MCG/ACT AERO USE 2 PUFFS TWICE DAILY. Patient taking differently: USE 2 PUFFS TWICE DAILY AS NEEDED FOR RESPIRATORY ISSUES. 04/22/16   [name], MD  fluticasone (FLONASE) 50 MCG/ACT nasal spray Place 2 sprays into the nose daily. Patient taking differently: Place 2 sprays into the nose daily as needed (For allergies.).  01/30/13   [name], MD  furosemide (LASIX) 20 MG tablet Take 1 tablet (20 mg total) by mouth daily. 04/04/17 07/03/17  Rise Mu, PA-C  hydrALAZINE (APRESOLINE) 25 MG tablet Take 1 tablet (25 mg total) by mouth 3 (three) times daily. Patient taking differently: Take 25 mg by mouth daily.  01/24/12   [name], MD  hydrALAZINE (APRESOLINE) 50 MG tablet Take 50 mg by mouth at bedtime.    [provider]  levothyroxine (SYNTHROID, LEVOTHROID) 100 MCG  tablet TAKE ONE TABLET BY MOUTH EVERY DAY Patient taking differently: TAKE ONE TABLET BY MOUTH IN THE MORNING 30 MINUTES BEFORE BREAKFAST 03/07/17   [name], MD  loperamide (IMODIUM A-D) 2 MG tablet Take 2 mg by mouth 4 (four) times daily as needed for diarrhea or loose stools.     [provider]  losartan (COZAAR) 100 MG tablet TAKE ONE TABLET BY MOUTH EVERY DAY Patient taking differently: TAKE ONE TABLET BY MOUTH EVERY DAY AT NIGHT 03/03/17   [name], MD  metoprolol tartrate (LOPRESSOR) 25 MG tablet Take 0.5 tablets (12.5 mg total) by mouth 2 (two) times daily. 03/28/17   End, Harrell Gave, MD  nystatin (MYCOSTATIN) 100000 UNIT/ML suspension Take 5 mLs (500,000 Units total) by mouth 4 (four) times daily. 04/06/17   [name], MD  nystatin (MYCOSTATIN/NYSTOP) powder Apply topically 4 (four) times daily. 04/06/17   [name], MD  omeprazole (PRILOSEC) 40 MG capsule TAKE ONE CAPSULE BY MOUTH TWICE A DAY.  BEFORE BREAKFAST AND SUPPER Patient taking differently: TAKE ONE CAPSULE BY MOUTH TWICE A DAY. [place] SUPPER 02/06/17   [name], MD  pramipexole (MIRAPEX) 0.125 MG tablet Take 1 tablet (0.125 mg total) by mouth 3 (three) times daily. Patient taking differently: Take 0.125 mg by mouth 3 (three) times daily as needed (for restless legs).  05/28/13   [name], MD  PROAIR HFA 108 (90 BASE) MCG/ACT inhaler INHALE 2 PUFFS EVERY 6 HOURS AS NEEDED FOR WHEEZING 03/13/15   [name], MD  Spacer/Aero-Holding Chambers [facility] ADVANTAGE) MISC 1 each by Other route once. Always uses her when you're using a metered-dose inhaler. You've aromatase medicine as much, he won't have his much side effect, but you it twice as much medicine and your lungs. 06/08/15   Ahmed Prima, MD  temazepam (RESTORIL) 15 MG capsule Take 1 capsule (15 mg total) by mouth at bedtime as needed for sleep. 01/24/17   [name], MD  vitamin B-12 (CYANOCOBALAMIN) 1000 MCG tablet Take 1,000 mcg by mouth daily.    [provider]      VITAL SIGNS:  Blood pressure (!) 174/78, pulse 63, temperature 97.6 F (36.4 C), temperature source Oral, resp. rate 19, height 5' (1.524 m), weight 68 kg (150 lb), SpO2 94 %.  PHYSICAL EXAMINATION:   Physical Exam  GENERAL:  83 y.o.-year-old elderly patient lying in the bed with no acute distress.  EYES: Pupils equal, round, reactive to light and accommodation. No scleral icterus. Extraocular muscles intact.  HEENT: Head atraumatic, normocephalic. Oropharynx and nasopharynx clear. Minimal oral thrush noted.patient is hard of hearing NECK:  Supple, no jugular venous distention. No thyroid enlargement, no tenderness.  LUNGS: Normal breath sounds bilaterally, no wheezing, rhonchi or crepitation. No use of accessory muscles of respiration. Decreased bibasilar breath sounds with fine right basilar crackles. CARDIOVASCULAR: S1, S2 normal. No rubs, or  gallops. 3/6 systolic murmur is present ABDOMEN: Soft, nontender, nondistended. Bowel sounds present. No organomegaly or mass.  EXTREMITIES: Nocyanosis, or clubbing. Bilateral 1+ ankle edema noted NEUROLOGIC: Cranial nerves II through XII are intact. Muscle strength 5/5 in all extremities. Sensation intact. Gait not checked. Global weakness present PSYCHIATRIC: The patient is alert and oriented x 3.  SKIN: No obvious rash, lesion, or ulcer.   LABORATORY PANEL:   CBC  Recent Labs Lab 04/15/17 0940  WBC 5.0  HGB 10.2*  HCT 30.1*  PLT 280   ------------------------------------------------------------------------------------------------------------------  Chemistries   Recent Labs Lab 04/15/17 0940  NA 137  K 3.9  CL 109  CO2 20*  GLUCOSE 97  BUN 31*  CREATININE 1.45*  CALCIUM 9.2   ------------------------------------------------------------------------------------------------------------------  Cardiac Enzymes  Recent Labs Lab 04/15/17 0940  TROPONINI <0.03   ------------------------------------------------------------------------------------------------------------------  RADIOLOGY:  Dg Chest 2 View  Result Date: 04/15/2017 CLINICAL DATA:  Shortness of breath. EXAM: CHEST  2 VIEW COMPARISON:  Radiographs of April 03, 2017. FINDINGS: The heart size and mediastinal contours are within normal limits. Atherosclerosis of thoracic aorta is noted. No pneumothorax is noted. Mild bilateral pleural effusions are noted with associated subsegmental atelectasis. The visualized skeletal structures are unremarkable. IMPRESSION: Mild bilateral pleural effusions are noted with associated subsegmental atelectasis. Aortic atherosclerosis. Electronically Signed   By: Marijo Conception, M.D.   On: 04/15/2017 10:30   US Venous Img Lower Unilateral Left  Result Date: 04/15/2017 CLINICAL DATA:  Left lower extremity edema.  Evaluate for DVT. EXAM: LEFT LOWER EXTREMITY VENOUS DOPPLER  ULTRASOUND TECHNIQUE: Gray-scale sonography with graded compression, as well as color Doppler and duplex ultrasound were performed to evaluate the lower extremity deep venous systems from the level of the common femoral vein and including the common femoral, femoral, profunda femoral, popliteal and calf veins including the posterior tibial, peroneal and gastrocnemius veins when visible. The superficial great saphenous vein was also interrogated. Spectral Doppler was utilized to evaluate flow at rest and with distal augmentation maneuvers in the common femoral, femoral and popliteal veins. COMPARISON:  None. FINDINGS: Contralateral Common Femoral Vein: Respiratory phasicity is normal and symmetric with the symptomatic side. No evidence of thrombus. Normal compressibility. Common Femoral Vein: No evidence of thrombus. Normal compressibility, respiratory phasicity and response to augmentation. Saphenofemoral Junction: No evidence of thrombus. Normal compressibility and flow on color Doppler imaging. Profunda Femoral Vein: No evidence of thrombus. Normal compressibility and flow on color Doppler imaging. Femoral Vein: No evidence of thrombus. Normal compressibility, respiratory phasicity and response to augmentation. Popliteal Vein: No evidence of thrombus. Normal compressibility, respiratory phasicity and response to augmentation. Calf Veins: No evidence of thrombus. Normal compressibility and flow on color Doppler imaging. Superficial Great Saphenous Vein: No evidence of thrombus. Normal compressibility and flow on color Doppler imaging. Venous Reflux:  None. Other Findings:  None. IMPRESSION: No evidence of DVT within the left lower extremity. Electronically Signed   By: Sandi Mariscal M.D.   On: 04/15/2017 11:32    EKG:   Orders placed or performed during the hospital encounter of 04/15/17  . EKG 12-Lead  . EKG 12-Lead  . ED EKG  . ED EKG  . EKG 12-Lead  . EKG 12-Lead    IMPRESSION AND PLAN:   Savannah Wilson  is a 83 y.o. female with a known history of asthma and chronic bronchitis, hypertension, GERD, persistent atrial fibrillation status post cardioversion on 03/28/2017, peripheral neuropathy, CKD stage IV, hypothyroidism presents to the hospital secondary to worsening shortness of breath.  #1 acute on chronic dyspnea-secondary to acute on chronic diastolic CHF exacerbation. -Echocardiogram from July 2018 showing LVH, EF of 55% and mitral regurgitation. -We'll consult cardiology. Started on IV Lasix daily -Daily weights, ins and outs monitoring -If her symptoms do not improve with diuresis, might need to consider stress test to rule out any occlusive pathology - trend troponins - Chest x-ray with no pneumonia. Since finished 10 days of Augmentin, will stop it. Only mild biasilar effusions noted.  #2 CK D stage IV-baseline creatinine around 1.8.  Monitor while on IV Lasix. -If worsening, consider nephrology consult.  #3 malignant hypertension- continue losartan, oral hydralazine, on Lasix and metoprolol. -Titrate medications as needed  #4 persistent A. Fib-status post cardioversion on 03/28/2017. Currently in sinus rhythm. -Continue eliquis. On metoprolol for rate control.  #5 hypothyroidism-continue Synthroid  #6 DVT prophylaxis-already on eliquis  Physical therapy consulted     All the records are reviewed and case discussed with ED provider. Management plans discussed with the patient, family and they are in agreement.  CODE STATUS: full code  TOTAL TIME TAKING CARE OF THIS PATIENT: 50 minutes.    Savannah Wilson M.D on 04/15/2017 at 1:40 PM  Between 7am to 6pm - Pager - [phone]  After 6pm go to [email or web address] - password [email or web address]  Office  [phone]  CC: Primary care physician; [name], MD

## 2017-04-15 NOTE — Discharge Instructions (Signed)
[facility] Failure [facility] appointment on April 25 2017 at 10:00am with Darylene Price, [place]. Please call [phone] to reschedule.

## 2017-04-15 NOTE — ED Notes (Signed)
Pt taken to US at this time

## 2017-04-15 NOTE — ED Notes (Signed)
Pt returned from US at this time.

## 2017-04-15 NOTE — ED Triage Notes (Signed)
Pt reports increasing SHOB x 1 week. Pt's daughter reports that pt's doctor listened to her today and told that her "lung sounds were clear" and to come to hospital due to possible heart failure, no further testing performed. Pt reports SHOB x 1 week, with "small" productive cough with clear phlegm. Pt is able to speak in complete sentences but is noted to be [facility] when speaking. Pt is noted to be tachypneic on arrival to room.

## 2017-04-15 NOTE — ED Notes (Signed)
Pt given meal tray per her request. NAD noted.

## 2017-04-15 NOTE — ED Provider Notes (Signed)
[facility] Emergency Department Provider Note ____________________________________________   First MD Initiated Contact with Patient 04/15/17 1012     (approximate)  I have reviewed the triage vital signs and the nursing notes.   HISTORY  Chief Complaint Shortness of Breath    HPI Savannah Wilson is a 83 y.o. female With a history of A. fib status post recent ablation, asthma, GERD, hypertension, mitral regurgitation, who presents with shortness of breath for the last several days, gradual onset, worsening, worse with exertion, and associated with mild cough. Patient denies chest pain, lightheadedness or nausea.  She reports bilateral lower extremity swelling, left greater than right.  Patient reports that she was treated for pneumonia recently with oral antibiotics and finished the course 5 days ago.  She also states that she had an ablation for A. fib approximately one month ago.     Past Medical History:  Diagnosis Date  . Asthma   . Barrett esophagus   . Chronic bronchitis   . Chronic kidney disease (CKD), stage IV (severe) ([place])   . Depression   . Diverticulosis   . GERD (gastroesophageal reflux disease)   . Gout   . Hyperlipidemia   . Hypertension   . Hypothyroid   . Mitral regurgitation    a. TTE 02/2017: EF 55-60%, normal WM, calcified mitral annulus with mod regurg, moderately dilated LA, unable to estimate PASP  . Pancreatitis due to biliary obstruction March 2010   s/p ERCP sphincterotomy, cholecystectomy  . Peripheral neuralgia   . Persistent atrial fibrillation ([place])    a. diagnosed 01/24/17; CHADS2VASc => 5 (HTN, age x 2, vascular disease with PAD and aortic plaque, female) giving her an estimated annual stroke risk of 6.7%; b. successful DCCV 03/28/2017; c. on Eliquis  . Venous insufficiency     Patient Active Problem List   Diagnosis Date Noted  . CHF exacerbation ([place]) 04/15/2017  . Atrial fibrillation ([place]) 01/25/2017  . Grief at  loss of child 01/25/2017  . Atherosclerosis of aorta ([place]) 07/26/2016  . Oral thrush 12/11/2015  . Angioedema 12/06/2015  . Routine general medical examination at a health care facility 07/25/2015  . Bilateral hand numbness 01/29/2015  . Urinary incontinence, mixed 01/29/2015  . Counseling regarding advanced directives and goals of care 01/29/2015  . Medicare annual wellness visit, subsequent 08/09/2014  . Obesity (BMI 30.0-34.9) 10/06/2013  . Hypothyroidism 10/06/2013  . Hiatal hernia with gastroesophageal reflux 09/24/2013  . Zenker's diverticulum 09/24/2013  . Anxiety state 07/05/2013  . Need for prophylactic vaccination and inoculation against influenza 05/07/2013  . Allergic rhinitis 01/11/2013  . Dyspnea on exertion 01/11/2013  . Bradycardia, drug induced 08/26/2012  . Renal cysts, acquired, bilateral 08/26/2012  . Anemia in chronic kidney disease (CKD) 06/24/2012  . Neuropathy 05/22/2012  . History of pancreatitis   . Hypertension 09/15/2011  . Systolic CHF, chronic ([place]) 11/06/2010  . Asthma   . Depression   . Venous insufficiency   . Barrett esophagus   . CKD (chronic kidney disease), stage IV [facility])     Past Surgical History:  Procedure Laterality Date  . ABDOMINAL HYSTERECTOMY    . CARDIOVERSION N/A 03/28/2017   Procedure: CARDIOVERSION;  Surgeon: Nelva Bush, MD;  Location: [facility];  Service: Cardiovascular;  Laterality: N/A;  . CARPAL TUNNEL RELEASE  jan 2013   [name]  . CATARACT EXTRACTION     right  . CATARACT EXTRACTION  2008  . CHOLECYSTECTOMY  02/10  . CHOLECYSTECTOMY  2010  .  TONSILLECTOMY    . VENTRAL HERNIA REPAIR    . VENTRAL HERNIA REPAIR  2007  . VESICOVAGINAL FISTULA CLOSURE W/ TAH      Prior to Admission medications   Medication Sig Start Date End Date Taking? Authorizing Provider  acetaminophen (TYLENOL) 650 MG CR tablet Take 650 mg by mouth every 8 (eight) hours as needed for pain.    Yes [provider]  ALPRAZolam  Duanne Moron) 0.5 MG tablet Take 1.5 tablets (0.75 mg total) by mouth at bedtime as needed for anxiety. Patient taking differently: Take 0.5 mg by mouth at bedtime.  10/03/16  Yes [name], MD  apixaban (ELIQUIS) 2.5 MG TABS tablet Take 1 tablet (2.5 mg total) by mouth 2 (two) times daily. 01/26/17  Yes Dunn, Areta Haber, PA-C  aspirin EC 81 MG tablet Take 81 mg by mouth every other day. IN THE EVENING   Yes [provider]  Cholecalciferol (VITAMIN D-3) 1000 units CAPS Take 1,000 Units by mouth daily.    Yes [provider]  DULERA 100-5 MCG/ACT AERO USE 2 PUFFS TWICE DAILY. Patient taking differently: USE 2 PUFFS TWICE DAILY AS NEEDED FOR RESPIRATORY ISSUES. 04/22/16  Yes [name], MD  fluticasone (FLONASE) 50 MCG/ACT nasal spray Place 2 sprays into the nose daily. Patient taking differently: Place 2 sprays into the nose daily as needed (For allergies.).  01/30/13  Yes [name], MD  furosemide (LASIX) 20 MG tablet Take 1 tablet (20 mg total) by mouth daily. 04/04/17 07/03/17 Yes Dunn, Areta Haber, PA-C  hydrALAZINE (APRESOLINE) 25 MG tablet Take 1 tablet (25 mg total) by mouth 3 (three) times daily. Patient taking differently: Take 25 mg by mouth daily.  01/24/12  Yes [name], MD  hydrALAZINE (APRESOLINE) 50 MG tablet Take 50 mg by mouth at bedtime.   Yes [provider]  levothyroxine (SYNTHROID, LEVOTHROID) 100 MCG tablet TAKE ONE TABLET BY MOUTH EVERY DAY Patient taking differently: TAKE ONE TABLET BY MOUTH IN THE MORNING 30 MINUTES BEFORE BREAKFAST 03/07/17  Yes [name], MD  loperamide (IMODIUM A-D) 2 MG tablet Take 2 mg by mouth 4 (four) times daily as needed for diarrhea or loose stools.    Yes [provider]  losartan (COZAAR) 100 MG tablet TAKE ONE TABLET BY MOUTH EVERY DAY Patient taking differently: TAKE ONE TABLET BY MOUTH EVERY DAY AT NIGHT 03/03/17  Yes [name], MD  metoprolol tartrate (LOPRESSOR) 25 MG tablet Take 0.5 tablets  (12.5 mg total) by mouth 2 (two) times daily. 03/28/17  Yes End, Harrell Gave, MD  omeprazole (PRILOSEC) 40 MG capsule TAKE ONE CAPSULE BY MOUTH TWICE A DAY. BEFORE BREAKFAST AND SUPPER Patient taking differently: TAKE ONE CAPSULE BY MOUTH TWICE A DAY. [place] SUPPER 02/06/17  Yes [name], MD  pramipexole (MIRAPEX) 0.125 MG tablet Take 1 tablet (0.125 mg total) by mouth 3 (three) times daily. Patient taking differently: Take 0.125 mg by mouth 3 (three) times daily as needed (for restless legs).  05/28/13  Yes [name], MD  PROAIR HFA 108 (90 BASE) MCG/ACT inhaler INHALE 2 PUFFS EVERY 6 HOURS AS NEEDED FOR WHEEZING 03/13/15  Yes [name], MD  Spacer/Aero-Holding Chambers [facility] ADVANTAGE) [place] 1 each by Other route once. Always uses her when you're using a metered-dose inhaler. You've aromatase medicine as much, he won't have his much side effect, but you it twice as much medicine and your lungs. 06/08/15  Yes Ahmed Prima, MD  temazepam (RESTORIL) 15 MG capsule Take 1 capsule (15 mg total) by mouth at bedtime as needed for sleep. 01/24/17  Yes [name], MD  vitamin B-12 (CYANOCOBALAMIN) 1000 MCG tablet Take 1,000 mcg by mouth daily.   Yes [provider]  amoxicillin-clavulanate (AUGMENTIN) 875-125 MG tablet Take 1 tablet by mouth 2 (two) times daily. Patient not taking: Reported on 04/15/2017 04/03/17   [name], MD  Beclomethasone Dipropionate 80 MCG/ACT AERS Place 2 sprays into the nose daily. Patient not taking: Reported on 04/15/2017 01/09/13   [name], MD  nystatin (MYCOSTATIN) 100000 UNIT/ML suspension Take 5 mLs (500,000 Units total) by mouth 4 (four) times daily. Patient not taking: Reported on 04/15/2017 04/06/17   [name], MD  nystatin (MYCOSTATIN/NYSTOP) powder Apply topically 4 (four) times daily. Patient not taking: Reported on 04/15/2017 04/06/17   [name], MD    Allergies Ace inhibitors; Benicar  [olmesartan medoxomil]; Clarithromycin; Clarithromycin; Norvasc [amlodipine besylate]; Levofloxacin; and Zithromax [azithromycin dihydrate]  Family History  Problem Relation Age of Onset  . Kidney failure Mother   . Hypertension Mother   . Kidney disease Mother   . Multiple myeloma Daughter   . Cancer Daughter        multiple myeloma  . Kidney disease Daughter   . Heart disease Father   . Rheumatologic disease Father   . Kidney cancer Father     Social History Social History  Substance Use Topics  . Smoking status: Never Smoker  . Smokeless tobacco: Never Used  . Alcohol use No     Comment: rare    Review of Systems  Constitutional: No fever/chills Eyes: No visual changes. ENT: No sore throat. Cardiovascular: Denies chest pain. Respiratory: positive for shortness of breath. Gastrointestinal: No nausea, no vomiting.  No diarrhea.  Genitourinary: Negative for dysuria.  Musculoskeletal: Negative for back pain. Skin: Negative for rash. Neurological: Negative for headaches, focal weakness or numbness.   ____________________________________________   PHYSICAL EXAM:  VITAL SIGNS: ED Triage Vitals  Enc Vitals Group     BP 04/15/17 0947 (!) 189/82     Pulse Rate 04/15/17 0947 64     Resp --      Temp 04/15/17 0947 97.6 F (36.4 C)     Temp Source 04/15/17 0947 Oral     SpO2 04/15/17 0947 99 %     Weight 04/15/17 0950 150 lb (68 kg)     Height 04/15/17 0950 5' (1.524 m)     Head Circumference --      Peak Flow --      Pain Score --      Pain Loc --      Pain Edu? --      Excl. in [place]? --     Constitutional: Alert and oriented. Slightly uncomfortable appearing, in no acute distress. Eyes: Conjunctivae are normal.  Head: Atraumatic. Nose: No congestion/rhinnorhea. Mouth/Throat: Mucous membranes are moist.   Neck: Normal range of motion.  Cardiovascular: Normal rate, regular rhythm. Grossly normal heart sounds.  Good peripheral circulation. Respiratory: Normal  respiratory effort.  No retractions. Lungs CTAB. Gastrointestinal: Soft and nontender. No distention.  Genitourinary: No CVA tenderness. Musculoskeletal: Bilateral 1-2+ lower extremity edema, left greater than right.  Extremities warm and well perfused.  Neurologic:  Normal speech and language. No gross focal neurologic deficits are appreciated.  Skin:  Skin is warm and dry. No rash noted. Psychiatric: Mood and affect are normal.  Speech and behavior are normal.  ____________________________________________   LABS (all labs ordered are listed, but only abnormal results are displayed)  Labs Reviewed  CBC WITH DIFFERENTIAL/PLATELET - Abnormal; Notable for the following:       Result Value   RBC 3.32 (*)    Hemoglobin 10.2 (*)    HCT 30.1 (*)    All other components within normal limits  BRAIN NATRIURETIC PEPTIDE - Abnormal; Notable for the following:    B Natriuretic Peptide 1,460.0 (*)    All other components within normal limits  BASIC METABOLIC PANEL - Abnormal; Notable for the following:    CO2 20 (*)    BUN 31 (*)    Creatinine, Ser 1.45 (*)    GFR calc non Af Amer 32 (*)    GFR calc Af Amer 37 (*)    All other components within normal limits  TROPONIN I  URINALYSIS, COMPLETE (UACMP) WITH MICROSCOPIC  TROPONIN I  TROPONIN I  TROPONIN I   ____________________________________________  EKG  ED ECG REPORT I, Arta Silence, the attending physician, personally viewed and interpreted this ECG.  Date: 04/15/2017 EKG Time: 949 Rate: 64 Rhythm: normal sinus rhythm QRS Axis: borderline R axis Intervals: borderline prolonged QT ST/T Wave abnormalities: normal Narrative Interpretation: sinus, no evidence of acute ischemia  ____________________________________________  RADIOLOGY  Left lower extremity DVT study negative.  Chest x-ray shows bilateral pleural effusions with subsegmental  atelectasis. ____________________________________________   PROCEDURES  Procedure(s) performed: No    Critical Care performed: No ____________________________________________   INITIAL IMPRESSION / ASSESSMENT AND PLAN / ED COURSE  Pertinent labs & imaging results that were available during my care of the patient were reviewed by me and considered in my medical decision making (see chart for details).  83 year old female status post recent ablation for A. fib and recent treatment for pneumonia presents with worsening shortness of breath for the last several days. Patient's physician saw her at home today and was concerned that she was in respiratory distress and had her come to the emergency department.  Vital signs are normal, exam is as described with asymmetrical leg swelling but no other significant findings. EKG is sinus and nonischemic. Overall differential includes recurrence of pneumonia/bronchitis, new onset CHF or fluid overload, less likely asthma/COPD given the edema and lack of wheeze. Low suspicion for PE given no tachycardia or hypoxia but the asymmetrical leg swelling is concerning so will obtain a DVT study.  Plan: CXR, basic and cardiac labs, reassess.     ----------------------------------------- 12:41 PM on 04/15/2017 -----------------------------------------  DVT study is negative. Given elevated BNP and chest x-ray finding suspect most likely CHF. Patient has had elevated blood pressures and intermittent chest pain in the ED so nitroglycerin given.  Patient admitted, and signed out to hospitalist Dr. Tressia Miners.    ____________________________________________   FINAL CLINICAL IMPRESSION(S) / ED DIAGNOSES  Final diagnoses:  Acute congestive heart failure, unspecified heart failure type ([place])      NEW MEDICATIONS STARTED DURING THIS VISIT:  Current Discharge Medication List       Note:  This document was prepared using Dragon voice recognition software  and may include unintentional dictation errors.    Arta Silence, MD 04/15/17 [phone]

## 2017-04-15 NOTE — Clinical Social Work Note (Signed)
Clinical Social Work Assessment  Patient Details  Name: Savannah Wilson MRN: [id number] Date of Birth: 1928-09-08  Date of referral:  04/15/17               Reason for consult:  Family Concerns                Permission sought to share information with:  Family Supports Permission granted to share information::  Yes, Verbal Permission Granted  Name::     Knute Neu [phone]  Agency::     Relationship::     Contact Information:     Housing/Transportation Living arrangements for the past 2 months:  [place] of Information:  Patient, Adult Children Patient Interpreter Needed:  None Criminal Activity/Legal Involvement Pertinent to Current Situation/Hospitalization:  No - Comment as needed Significant Relationships:  Adult Children, [place], 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  Yes Need for family participation in patient care:  Yes (Comment)  Care giving concerns:  Daughter  would like Mom to get better SOB dealt with   Social Worker assessment / plan: LCSW  Introduced myself to patient and obtained verbal consent to ask her daughter to participate.  Patient is oriented and is here because she had pneumonia 2 weeks ago and is short of breath again. Patient is very independent with all her ADL's and lives in a single family home. Her daughter reported she had 2 daughters one just passed away in 10-03-2022 of this year. Her daughter is her [name] [phone] visits with her mother daily and lives 5 minutes away. Her Mom is NOT going to a skilled nursing home and is to return home with home health supports as needed. No further needs at this time.   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) PT Recommendations:  Not assessed at this time Information / Referral to community resources:  Other (Comment Required) (Home health options)  Patient/Family's Response to care:  Good understanding that she needs to be in hospital  Patient/Family's Understanding of and Emotional Response to Diagnosis, Current Treatment, and Prognosis:  Both pt and daughter have a good understanding of treatment plan  Emotional Assessment Appearance:  Appears stated age Attitude/Demeanor/Rapport:  Guarded (Polite and calm) Affect (typically observed):  Accepting, Adaptable, Calm Orientation:  Oriented to Self, Oriented to Place, Oriented to  Time, Oriented to Situation Alcohol / Substance use:  Not Applicable Psych involvement (Current and /or in the community):  No (Comment)  Discharge Needs  Concerns to be addressed:  No discharge needs identified Readmission within the last 30 days:  No Current discharge risk:  None Barriers to Discharge:  No Barriers Identified   Joana Reamer, LCSW 04/15/2017, 3:22 PM

## 2017-04-15 NOTE — ED Notes (Signed)
Pt given cup of ice with MD permission at this time.

## 2017-04-16 DIAGNOSIS — I10 Essential (primary) hypertension: Secondary | ICD-10-CM

## 2017-04-16 DIAGNOSIS — I472 Ventricular tachycardia: Secondary | ICD-10-CM

## 2017-04-16 DIAGNOSIS — I481 Persistent atrial fibrillation: Secondary | ICD-10-CM

## 2017-04-16 DIAGNOSIS — R0602 Shortness of breath: Secondary | ICD-10-CM

## 2017-04-16 DIAGNOSIS — I5030 Unspecified diastolic (congestive) heart failure: Secondary | ICD-10-CM

## 2017-04-16 LAB — BASIC METABOLIC PANEL
Anion gap: 6 (ref 5–15)
BUN: 28 mg/dL — AB (ref 6–20)
CALCIUM: 8.8 mg/dL — AB (ref 8.9–10.3)
CO2: 24 mmol/L (ref 22–32)
Chloride: 111 mmol/L (ref 101–111)
Creatinine, Ser: 1.44 mg/dL — ABNORMAL HIGH (ref 0.44–1.00)
GFR calc Af Amer: 37 mL/min — ABNORMAL LOW (ref >60)
GFR, EST NON AFRICAN AMERICAN: 32 mL/min — AB (ref >60)
GLUCOSE: 104 mg/dL — AB (ref 65–99)
POTASSIUM: 3.6 mmol/L (ref 3.5–5.1)
Sodium: 141 mmol/L (ref 135–145)

## 2017-04-16 LAB — CBC
HEMATOCRIT: 29.9 % — AB (ref 35.0–47.0)
Hemoglobin: 10 g/dL — ABNORMAL LOW (ref 12.0–16.0)
MCH: 30.8 pg (ref 26.0–34.0)
MCHC: 33.6 g/dL (ref 32.0–36.0)
MCV: 91.7 fL (ref 80.0–100.0)
Platelets: 260 10*3/uL (ref 150–440)
RBC: 3.26 MIL/uL — ABNORMAL LOW (ref 3.80–5.20)
RDW: 14 % (ref 11.5–14.5)
WBC: 5.1 10*3/uL (ref 3.6–11.0)

## 2017-04-16 LAB — TROPONIN I: Troponin I: 0.03 ng/mL (ref <0.03)

## 2017-04-16 LAB — MAGNESIUM: Magnesium: 1.8 mg/dL (ref 1.7–2.4)

## 2017-04-16 MED ORDER — APIXABAN 5 MG PO TABS
5.0000 mg | ORAL_TABLET | Freq: Two times a day (BID) | ORAL | Status: DC
  Administered 2017-04-16 – 2017-04-17 (×2): 5 mg via ORAL
  Filled 2017-04-16 (×2): qty 1

## 2017-04-16 MED ORDER — POTASSIUM CHLORIDE CRYS ER 20 MEQ PO TBCR
40.0000 meq | EXTENDED_RELEASE_TABLET | Freq: Once | ORAL | Status: AC
  Administered 2017-04-16: 40 meq via ORAL
  Filled 2017-04-16: qty 2

## 2017-04-16 MED ORDER — GUAIFENESIN-DM 100-10 MG/5ML PO SYRP
5.0000 mL | ORAL_SOLUTION | ORAL | Status: DC | PRN
  Administered 2017-04-16 (×2): 5 mL via ORAL
  Filled 2017-04-16 (×2): qty 5

## 2017-04-16 MED ORDER — HYDRALAZINE HCL 25 MG PO TABS
25.0000 mg | ORAL_TABLET | Freq: Once | ORAL | Status: AC
  Administered 2017-04-16: 25 mg via ORAL
  Filled 2017-04-16: qty 1

## 2017-04-16 MED ORDER — HYDRALAZINE HCL 50 MG PO TABS
50.0000 mg | ORAL_TABLET | Freq: Two times a day (BID) | ORAL | Status: DC
  Administered 2017-04-16 – 2017-04-19 (×6): 50 mg via ORAL
  Filled 2017-04-16 (×6): qty 1

## 2017-04-16 MED ORDER — SODIUM CHLORIDE 0.9% FLUSH
3.0000 mL | Freq: Two times a day (BID) | INTRAVENOUS | Status: DC
  Administered 2017-04-16 – 2017-04-19 (×5): 3 mL via INTRAVENOUS

## 2017-04-16 NOTE — Progress Notes (Signed)
Pt c/o SOB, 92% RA, administered dulera at this time and placed pt on 2L [place]. Oxygen level at 95-98% on 2L [place].

## 2017-04-16 NOTE — Evaluation (Signed)
Physical Therapy Evaluation Patient Details Name: ROSY ESTABROOK MRN: [id number] DOB: [birth year] Today's Date: 04/16/2017   History of Present Illness  Rhonna Holster is a 83 y.o. female with a known history of asthma and chronic bronchitis, hypertension, GERD, persistent atrial fibrillation status post cardioversion on 03/28/2017, peripheral neuropathy, CKD stage IV, hypothyroidism presents to the hospital secondary to worsening shortness of breath. Patient has had dyspnea for several months now,but worsened in the last week. She was noted to have persistent atrial fibrillation and was successfully cardioverted on 03/28/17. With her dyspnea, she was diagnosed with pneumonia and started on Augmentin. Her Lasix was changed from 40 mg every other day to 20 mg daily about 10 days ago. Patient denies any increased salt intake, no recent travel. She does notice that she is having occasional chest pain and worsening dyspnea especially with minimal exertion. Her orthopnea is at baseline. Denies any cough. Also complains of generalized weakness. No fevers or chills. She is unable to care for herself with her worsening dyspnea and chest pain and so presented to emergency room. BNP is elevated, troponin is negative. She is currently admitted for acute on chronic dyspnea secondary to acute CHF exacerbation.  Clinical Impression  Pt admitted with above diagnosis. Pt currently with functional limitations due to the deficits listed below (see PT Problem List). Pt requiring minA+1 for bed mobiltiy but CGA only for transfers and ambulation. She takes short shuffling steps but is steady with UE support on rolling walker. She is able to ambulate 46' in the hallway. No external support from therapist needed for balance Pt reports being somewhat lethargic today secondary to medications. SaO2 at rest on room air is 94%. With exertion on room air SaO2 drops to 90%. HR remains in the low 60's bpm throughout ambulation. Pt with balance  deficits when placing feet together requiring UE support to prevent her from falling. She will need a rolling walker at discharge. Pt inherited a three-wheeled walker but this is not stable enough for her to utilize. She would also benefit from [facility] PT for strength and balance. Pt will benefit from PT services to address deficits in strength, balance, and mobility in order to return to full function at home.      Follow Up Recommendations Home health PT    Equipment Recommendations  Rolling walker with 5" wheels;Other (comment) (Pt has 3 wheeled walker but needs a front-wheeled walker)    Recommendations for Other Services       Precautions / Restrictions Precautions Precautions: Fall Restrictions Weight Bearing Restrictions: No      Mobility  Bed Mobility Overal bed mobility: Needs Assistance Bed Mobility: Supine to Sit     Supine to sit: Min assist     General bed mobility comments: Pt requires minA+1 for trunk support due to poor abdominal strength  Transfers Overall transfer level: Needs assistance Equipment used: Rolling walker (2 wheeled) Transfers: Sit to/from Stand Sit to Stand: Min guard         General transfer comment: Pt moves slowly but is stable in standing with UE support on rolling walker. No external assist required to come to standing  Ambulation/Gait Ambulation/Gait assistance: Min guard Ambulation Distance (Feet): 80 Feet Assistive device: Rolling walker (2 wheeled) Gait Pattern/deviations: Decreased step length - right;Decreased step length - left Gait velocity: Decreased and below patient's baseline Gait velocity interpretation: <1.8 ft/sec, indicative of risk for recurrent falls General Gait Details: Pt takes short shuffling steps but is steady with UE  support on rolling walker. No external support from therapist. Pt reports being somewhat lethargic today secondary to medications. SaO2 at rest on room air is 94%. With exertion on room air SaO2  drops to 90%. HR remains in the low 60's bpm throughout ambulation.   Stairs            Wheelchair Mobility    Modified Rankin (Stroke Patients Only)       Balance Overall balance assessment: Needs assistance Sitting-balance support: No upper extremity supported Sitting balance-Leahy Scale: Good     Standing balance support: No upper extremity supported Standing balance-Leahy Scale: Fair Standing balance comment: Able to maintain feet apart but unable to place feet together without UE support due to LOB                             Pertinent Vitals/Pain Pain Assessment: No/denies pain    Home Living Family/patient expects to be discharged to:: Private residence Living Arrangements: Alone Available Help at Discharge: Family Type of Home: House Home Access: Stairs to enter Entrance Stairs-Rails: Right Entrance Stairs-Number of Steps: 1 Home Layout: One level Home Equipment: Other (comment);Cane - quad;Shower seat;Grab bars - tub/shower (Three-wheeled walker)      Prior Function Level of Independence: Independent               Hand Dominance   Dominant Hand: Right    Extremity/Trunk Assessment   Upper Extremity Assessment Upper Extremity Assessment: RUE deficits/detail RUE Deficits / Details: Chronic R shoulder flexion weakness. Otherwise generalized UE weakness but no focal deficits. Intact sensation    Lower Extremity Assessment Lower Extremity Assessment: Generalized weakness       Communication   Communication: No difficulties  Cognition Arousal/Alertness: Lethargic;Suspect due to medications Behavior During Therapy: [facility] for tasks assessed/performed Overall Cognitive Status: Within Functional Limits for tasks assessed                                        General Comments      Exercises     Assessment/Plan    PT Assessment Patient needs continued PT services  PT Problem List Decreased strength;Decreased  activity tolerance;Decreased balance;Decreased mobility;Cardiopulmonary status limiting activity       PT Treatment Interventions DME instruction;Gait training;Stair training;Functional mobility training;Therapeutic activities;Therapeutic exercise;Balance training;Neuromuscular re-education;Patient/family education    PT Goals (Current goals can be found in the Care Plan section)  Acute Rehab PT Goals Patient Stated Goal: Return to her prior functional level PT Goal Formulation: With patient/family Time For Goal Achievement: 04/30/17 Potential to Achieve Goals: Good    Frequency Min 2X/week   Barriers to discharge Decreased caregiver support Lives alone but has good family support    Co-evaluation               AM-PAC PT "6 Clicks" Daily Activity  Outcome Measure Difficulty turning over in bed (including adjusting bedclothes, sheets and blankets)?: A Lot Difficulty moving from lying on back to sitting on the side of the bed? : Unable Difficulty sitting down on and standing up from a chair with arms (e.g., wheelchair, bedside commode, etc,.)?: A Little Help needed moving to and from a bed to chair (including a wheelchair)?: A Little Help needed walking in hospital room?: A Little Help needed climbing 3-5 steps with a railing? : A Little 6 Click Score: 15  End of Session Equipment Utilized During Treatment: Gait belt Activity Tolerance: Patient tolerated treatment well Patient left: in bed;with call bell/phone within reach;with bed alarm set;with family/visitor present Nurse Communication: Mobility status PT Visit Diagnosis: Unsteadiness on feet (R26.81);Muscle weakness (generalized) (M62.81)    Time: 4128-2081 PT Time Calculation (min) (ACUTE ONLY): 20 min   Charges:   PT Evaluation $PT Eval Low Complexity: 1 Low     PT G Codes:   PT G-Codes **NOT FOR INPATIENT CLASS** Functional Assessment Tool Used: AM-PAC 6 Clicks Basic Mobility Functional Limitation:  Mobility: Walking and moving around Mobility: Walking and Moving Around Current Status (N8871): At least 40 percent but less than 60 percent impaired, limited or restricted Mobility: Walking and Moving Around Goal Status [phone]): At least 20 percent but less than 40 percent impaired, limited or restricted    Phillips Grout PT, DPT    Huprich,Jason 04/16/2017, 11:32 AM

## 2017-04-16 NOTE — Progress Notes (Signed)
Pt HR Sinus Brady in the 50's. Dr. Saunders Revel aware. Okay to give metropolol. Will continue to monitor.

## 2017-04-16 NOTE — Consult Note (Signed)
Cardiology Consultation:   Patient ID: Savannah Wilson; [id number]; [birth year]   Admit date: 04/15/2017 Date of Consult: 04/16/2017  Primary Care Provider: [name], MD Primary Cardiologist: Savannah Sacramento, MD Primary Electrophysiologist:  None   Patient Profile:   Savannah Wilson is a 83 y.o. female with a hx of Persistent atrial fibrillation status post cardioversion on 03/28/17, hypertension, asthma/COPD, CK ED stage IV, hyperlipidemia, anemia of chronic disease, venous insufficiency, para 6 esophagus, and hypothyroidism, who is being seen today for the evaluation shortness of breath at the request of Dr. Verdell Carmine.  History of Present Illness:   Savannah Wilson reports that she only felt well one day after her cardioversion on 03/28/17. She subsequently developed a productive cough and shortness of breath, leading to a diagnosis of pneumonia. She was treated with antibiotics and noted improvement in her sputum production but continued shortness of breath. This was most pronounced with exertion. She has also noted progressive swelling of her legs, left greater than right. She notes that her weight has been stable. For the last few days, [name] has also had episodic sharp left-sided chest pain, which is not related to any particular activity and lasts a few minutes before resolving on its. She had noticed this for a few days leading up to her admission. She also had an episode of nausea last night which has since resolved.  Of note, at post cardioversion follow-up with Savannah Faith, PA, on 8/28, furosemide was switched from 40 mg every other day to 20 mg daily at the patient's request due to urinary frequency with the 40 mg dosing. Since that time, Savannah Wilson has noted decreased urine output. Home blood pressures have typically run 6:54 to 650 systolic, though they were severely elevated on admission (212/90). She has been compliant with her medications, including apixaban. She has not had any  significant bleeding.  Past Medical History:  Diagnosis Date  . Asthma   . Barrett esophagus   . Chronic bronchitis   . Chronic kidney disease (CKD), stage IV (severe) ([place])   . Depression   . Diverticulosis   . GERD (gastroesophageal reflux disease)   . Gout   . Hyperlipidemia   . Hypertension   . Hypothyroid   . Mitral regurgitation    a. TTE 02/2017: EF 55-60%, normal WM, calcified mitral annulus with mod regurg, moderately dilated LA, unable to estimate PASP  . Pancreatitis due to biliary obstruction March 2010   s/p ERCP sphincterotomy, cholecystectomy  . Peripheral neuralgia   . Persistent atrial fibrillation ([place])    a. diagnosed 01/24/17; CHADS2VASc => 5 (HTN, age x 2, vascular disease with PAD and aortic plaque, female) giving her an estimated annual stroke risk of 6.7%; b. successful DCCV 03/28/2017; c. on Eliquis  . Venous insufficiency     Past Surgical History:  Procedure Laterality Date  . ABDOMINAL HYSTERECTOMY    . CARDIOVERSION N/A 03/28/2017   Procedure: CARDIOVERSION;  Surgeon: Nelva Bush, MD;  Location: [facility];  Service: Cardiovascular;  Laterality: N/A;  . CARPAL TUNNEL RELEASE  jan 2013   Savannah Wilson  . CATARACT EXTRACTION     right  . CATARACT EXTRACTION  2008  . CHOLECYSTECTOMY  02/10  . CHOLECYSTECTOMY  2010  . TONSILLECTOMY    . VENTRAL HERNIA REPAIR    . VENTRAL HERNIA REPAIR  2007  . VESICOVAGINAL FISTULA CLOSURE W/ TAH       Inpatient Medications: Scheduled Meds: . ALPRAZolam  0.5 mg Oral  QHS  . apixaban  2.5 mg Oral BID  . aspirin EC  81 mg Oral QODAY  . cholecalciferol  1,000 Units Oral Daily  . fluticasone  2 spray Each Nare Daily  . furosemide  40 mg Intravenous Daily  . hydrALAZINE  25 mg Oral Daily  . hydrALAZINE  50 mg Oral QHS  . levothyroxine  100 mcg Oral Daily  . losartan  100 mg Oral Daily  . metoprolol tartrate  12.5 mg Oral BID  . mometasone-formoterol  2 puff Inhalation BID  . nystatin  5 mL Oral QID  .  pantoprazole  40 mg Oral Daily  . vitamin B-12  1,000 mcg Oral Daily   Continuous Infusions:  PRN Meds: acetaminophen **OR** acetaminophen, guaiFENesin-dextromethorphan, hydrALAZINE, loperamide, ondansetron **OR** ondansetron (ZOFRAN) IV, pramipexole, temazepam  Allergies:    Allergies  Allergen Reactions  . Ace Inhibitors Cough  . Benicar [Olmesartan Medoxomil]     Unsure of reaction type  . Clarithromycin Other (See Comments)    Blisters in mouth  . Clarithromycin Other (See Comments)    Unknown   . Norvasc [Amlodipine Besylate]   . Levofloxacin Rash  . Zithromax [Azithromycin Dihydrate] Rash    Social History:   Social History   Social History  . Marital status: Widowed    Spouse name: N/A  . Number of children: 2  . Years of education: N/A   Occupational History  . 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 Smoking status: Never Smoker  . Smokeless tobacco: Never Used  . Alcohol use No     Comment: rare  . Drug use: No  . Sexual activity: No   Other Topics Concern  . Not on file   Social History Narrative   ** Merged History Encounter **       Widowed. Has 2 children and lives alone.   Lives by herself. Has a cane, but ambulates independently    Family History:   Family History  Problem Relation Age of Onset  . Kidney failure Mother   . Hypertension Mother   . Kidney disease Mother   . Multiple myeloma Daughter   . Cancer Daughter        multiple myeloma  . Kidney disease Daughter   . Heart disease Father   . Rheumatologic disease Father   . Kidney cancer Father      ROS:  Review of Systems  Constitution: Positive for weakness and malaise/fatigue. Negative for chills and fever.  HENT: Positive for congestion and sore throat (recently treated for thrush).   Eyes: Negative.   Cardiovascular: Positive for chest pain, dyspnea on exertion and leg swelling. Negative for orthopnea, palpitations and paroxysmal nocturnal dyspnea.    Respiratory: Positive for cough, shortness of breath and sputum production.   Endocrine: Negative.   Hematologic/Lymphatic: Negative.   Skin: Negative.   Musculoskeletal: Negative.   Gastrointestinal: Positive for nausea (last night).  Psychiatric/Behavioral: The patient is nervous/anxious.   Allergic/Immunologic: Negative.    Physical Exam/Data:   Vitals:   04/16/17 0307 04/16/17 0404 04/16/17 0806 04/16/17 0844  BP:  (!) 171/63 (!) 152/57   Pulse: 60 60 (!) 58   Resp:   18   Temp:  97.8 F (36.6 C) 97.8 F (36.6 C)   TempSrc:  Oral Oral   SpO2: 96% 95% 98%   Weight:  143 lb 3.2 oz (65 kg)  140 lb 3.2 oz (63.6 kg)  Height:  Intake/Output Summary (Last 24 hours) at 04/16/17 1153 Last data filed at 04/16/17 1051  Gross per 24 hour  Intake                0 ml  Output             1000 ml  Net            -1000 ml   Filed Weights   04/15/17 1521 04/16/17 0404 04/16/17 0844  Weight: 149 lb (67.6 kg) 143 lb 3.2 oz (65 kg) 140 lb 3.2 oz (63.6 kg)   Body mass index is 27.38 kg/m.  General:  Overweight elderly woman, lying comfortably in bed. Her daughters at the bedside. HEENT: Conjunctival pallor noted without scleral icterus. No oral lesions visualized. Lymph: no adenopathy Neck: JVP approximately 8-10 cm with positive HJR. Endocrine:  No thryomegaly Vascular: 2+ radial and pedal pulses bilaterally. Cardiac:  Regular rate and rhythm with 2/6 holosystolic murmur loudest at the left lower sternal border. No rubs or gallops. Lungs:  Normal work of breathing with fair air movement. Faint wheezes without crackles. Abd: soft, nontender, no hepatomegaly  Ext: Trace ankle edema bilaterally. Musculoskeletal:  No deformities, BUE and BLE strength normal and equal Skin: warm and dry  Neuro:  CNs 2-12 intact, no focal abnormalities noted Psych:  Normal affect   EKG:  The EKG was personally reviewed and demonstrates:  Normal sinus rhythm with rightward axis and low voltage.  Prolonged QT.  Telemetry:  Telemetry was personally reviewed and demonstrates:  Normal sinus rhythm and sinus bradycardia with PVCs and short run of nonsustained ventricular tachycardia (5 beats).  Relevant CV Studies: Left lower extreme deep venous duplex (04/15/17): No evidence of DVT.  Echo (02/14/17): Normal LV size with mild LVH. LVEF 55-60% with normal wall motion. Mitral annular calcification with moderate regurgitation. Moderate left atrial enlargement. Normal RV size and function.  Laboratory Data:  Chemistry Recent Labs Lab 04/15/17 0940 04/16/17 0413  NA 137 141  K 3.9 3.6  CL 109 111  CO2 20* 24  GLUCOSE 97 104*  BUN 31* 28*  CREATININE 1.45* 1.44*  CALCIUM 9.2 8.8*  GFRNONAA 32* 32*  GFRAA 37* 37*  ANIONGAP 8 6    No results for input(s): PROT, ALBUMIN, AST, ALT, ALKPHOS, BILITOT in the last 168 hours. Hematology Recent Labs Lab 04/15/17 0940 04/16/17 0413  WBC 5.0 5.1  RBC 3.32* 3.26*  HGB 10.2* 10.0*  HCT 30.1* 29.9*  MCV 90.7 91.7  MCH 30.7 30.8  MCHC 33.9 33.6  RDW 13.9 14.0  PLT 280 260   Cardiac Enzymes Recent Labs Lab 04/15/17 0940 04/15/17 1624 04/15/17 2142 04/16/17 0413  TROPONINI <0.03 0.03* 0.03* 0.03*   No results for input(s): TROPIPOC in the last 168 hours.  BNP Recent Labs Lab 04/15/17 0940  BNP 1,460.0*    DDimer No results for input(s): DDIMER in the last 168 hours.  Radiology/Studies:  Dg Chest 2 View  Result Date: 04/15/2017 CLINICAL DATA:  Shortness of breath. EXAM: CHEST  2 VIEW COMPARISON:  Radiographs of April 03, 2017. FINDINGS: The heart size and mediastinal contours are within normal limits. Atherosclerosis of thoracic aorta is noted. No pneumothorax is noted. Mild bilateral pleural effusions are noted with associated subsegmental atelectasis. The visualized skeletal structures are unremarkable. IMPRESSION: Mild bilateral pleural effusions are noted with associated subsegmental atelectasis. Aortic atherosclerosis.  Electronically Signed   By: Marijo Conception, M.D.   On: 04/15/2017 10:30   US Venous  Img Lower Unilateral Left  Result Date: 04/15/2017 CLINICAL DATA:  Left lower extremity edema.  Evaluate for DVT. EXAM: LEFT LOWER EXTREMITY VENOUS DOPPLER ULTRASOUND TECHNIQUE: Gray-scale sonography with graded compression, as well as color Doppler and duplex ultrasound were performed to evaluate the lower extremity deep venous systems from the level of the common femoral vein and including the common femoral, femoral, profunda femoral, popliteal and calf veins including the posterior tibial, peroneal and gastrocnemius veins when visible. The superficial great saphenous vein was also interrogated. Spectral Doppler was utilized to evaluate flow at rest and with distal augmentation maneuvers in the common femoral, femoral and popliteal veins. COMPARISON:  None. FINDINGS: Contralateral Common Femoral Vein: Respiratory phasicity is normal and symmetric with the symptomatic side. No evidence of thrombus. Normal compressibility. Common Femoral Vein: No evidence of thrombus. Normal compressibility, respiratory phasicity and response to augmentation. Saphenofemoral Junction: No evidence of thrombus. Normal compressibility and flow on color Doppler imaging. Profunda Femoral Vein: No evidence of thrombus. Normal compressibility and flow on color Doppler imaging. Femoral Vein: No evidence of thrombus. Normal compressibility, respiratory phasicity and response to augmentation. Popliteal Vein: No evidence of thrombus. Normal compressibility, respiratory phasicity and response to augmentation. Calf Veins: No evidence of thrombus. Normal compressibility and flow on color Doppler imaging. Superficial Great Saphenous Vein: No evidence of thrombus. Normal compressibility and flow on color Doppler imaging. Venous Reflux:  None. Other Findings:  None. IMPRESSION: No evidence of DVT within the left lower extremity. Electronically Signed   By: Sandi Mariscal M.D.   On: 04/15/2017 11:32    Assessment and Plan:  Shortness of breath and heart failure with preserved ejection fraction Symptoms are likely multifactorial. Given increased leg edema, mildly elevated JVP and pleural effusions noted on chest radiograph, I suspect that diastolic heart failure is contributing. Pulmonary hypertension is also a concern; PA systolic pressure could not be estimated on recent echo. Underlying intrinsic lung disease is also a consideration, though fair air movement with only faint wheezes are appreciated today.  Continue with IV diuresis with net fluid balance of -1-2 L in 24 hours.  Strict I's and O's and daily weights.  I will discontinue metoprolol in case some degree of chronotropic incompetence and obstructive lung disease are worsened by beta blockade.  Given episodes of chest pain, age, and aortic atherosclerosis noted on chest radiograph, underlying coronary disease is a consideration. We will need to consider noninvasive ischemia evaluation.  Blood pressure control will be needed, as diastolic dysfunction and mitral regurgitation will be worsened by uncontrolled hypertension.  If symptoms persist despite aforementioned workup and diuresis, Ms. Riesen may ultimately require right heart catheterization to exclude pulmonary hypertension and better understand her volume status.  Persistent atrial fibrillation Patient remains in sinus rhythm on low-dose metoprolol and apixaban.  Discontinue metoprolol as above.  Continue apixaban. Given improved renal function (creatinine less than 1.5), I will increase her dose to 5 mg twice a day.  Chest pain Symptoms are somewhat atypical, the patient is at risk for CAD.  Once patient has been adequately diuresed, we will need to consider pharmacologic myocardial perfusion stress test.  I will make the patient NPO after midnight in case stress testing can be performed tomorrow.  Hypertension Blood pressure  is suboptimally controlled.  Discontinue metoprolol, as above.  Continue losartan 100 mg daily.  Increase hydralazine to 50 mg twice a day.  Nonsustained ventricular tachycardia Occasional PVCs and brief nonsustained ventricular tachycardia noted on telemetry.  Replete potassium to  maintain greater than 4.0.  Check magnesium level and replete to maintain greater than 2.0.  Consider ischemia evaluation, as above.   Signed, Nelva Bush, MD  04/16/2017 11:53 AM

## 2017-04-16 NOTE — Progress Notes (Signed)
[place] [facility] [place] NAME: Savannah Wilson    MR#:  [id number]  DATE OF BIRTH:  Mar 01, 1929  SUBJECTIVE:   Patient here due to shortness of breath and chest pain and noted to be in congestive heart failure. Diuresed with IV Lasix and much improved since yesterday. Patient's daughters are at bedside.  REVIEW OF SYSTEMS:    Review of Systems  Constitutional: Negative for chills and fever.  HENT: Negative for congestion and tinnitus.   Eyes: Negative for blurred vision and double vision.  Respiratory: Negative for cough, shortness of breath and wheezing.   Cardiovascular: Negative for chest pain, orthopnea and PND.  Gastrointestinal: Negative for abdominal pain, diarrhea, nausea and vomiting.  Genitourinary: Negative for dysuria and hematuria.  Neurological: Negative for dizziness, sensory change and focal weakness.  All other systems reviewed and are negative.   Nutrition: Heart healthy Tolerating Diet: yes Tolerating PT: Await Eval.    DRUG ALLERGIES:   Allergies  Allergen Reactions  . Ace Inhibitors Cough  . Benicar [Olmesartan Medoxomil]     Unsure of reaction type  . Clarithromycin Other (See Comments)    Blisters in mouth  . Clarithromycin Other (See Comments)    Unknown   . Norvasc [Amlodipine Besylate]   . Levofloxacin Rash  . Zithromax [Azithromycin Dihydrate] Rash    VITALS:  Blood pressure (!) 138/48, pulse (!) 50, temperature 98.4 F (36.9 C), temperature source Oral, resp. rate 18, height 5' (1.524 m), weight 63.6 kg (140 lb 3.2 oz), SpO2 97 %.  PHYSICAL EXAMINATION:   Physical Exam  GENERAL:  83 y.o.-year-old patient lying in bed in no acute distress.  EYES: Pupils equal, round, reactive to light and accommodation. No scleral icterus. Extraocular muscles intact.  HEENT: Head atraumatic, normocephalic. Oropharynx and nasopharynx clear.  NECK:  Supple, no jugular venous distention. No thyroid enlargement, no  tenderness.  LUNGS: Normal breath sounds bilaterally, no wheezing, minimal rales b/l, No rhonchi. No use of accessory muscles of respiration.  CARDIOVASCULAR: S1, S2 normal. No murmurs, rubs, or gallops.  ABDOMEN: Soft, nontender, nondistended. Bowel sounds present. No organomegaly or mass.  EXTREMITIES: No cyanosis, clubbing , Trace edema b/l    NEUROLOGIC: Cranial nerves II through XII are intact. No focal Motor or sensory deficits b/l.   PSYCHIATRIC: The patient is alert and oriented x 3.  SKIN: No obvious rash, lesion, or ulcer.    LABORATORY PANEL:   CBC  Recent Labs Lab 04/16/17 0413  WBC 5.1  HGB 10.0*  HCT 29.9*  PLT 260   ------------------------------------------------------------------------------------------------------------------  Chemistries   Recent Labs Lab 04/16/17 0413  NA 141  K 3.6  CL 111  CO2 24  GLUCOSE 104*  BUN 28*  CREATININE 1.44*  CALCIUM 8.8*  MG 1.8   ------------------------------------------------------------------------------------------------------------------  Cardiac Enzymes  Recent Labs Lab 04/16/17 0413  TROPONINI 0.03*   ------------------------------------------------------------------------------------------------------------------  RADIOLOGY:  Dg Chest 2 View  Result Date: 04/15/2017 CLINICAL DATA:  Shortness of breath. EXAM: CHEST  2 VIEW COMPARISON:  Radiographs of April 03, 2017. FINDINGS: The heart size and mediastinal contours are within normal limits. Atherosclerosis of thoracic aorta is noted. No pneumothorax is noted. Mild bilateral pleural effusions are noted with associated subsegmental atelectasis. The visualized skeletal structures are unremarkable. IMPRESSION: Mild bilateral pleural effusions are noted with associated subsegmental atelectasis. Aortic atherosclerosis. Electronically Signed   By: Marijo Conception, M.D.   On: 04/15/2017 10:30   US Venous Img Lower Unilateral Left  Result Date:  04/15/2017 CLINICAL DATA:  Left lower extremity edema.  Evaluate for DVT. EXAM: LEFT LOWER EXTREMITY VENOUS DOPPLER ULTRASOUND TECHNIQUE: Gray-scale sonography with graded compression, as well as color Doppler and duplex ultrasound were performed to evaluate the lower extremity deep venous systems from the level of the common femoral vein and including the common femoral, femoral, profunda femoral, popliteal and calf veins including the posterior tibial, peroneal and gastrocnemius veins when visible. The superficial great saphenous vein was also interrogated. Spectral Doppler was utilized to evaluate flow at rest and with distal augmentation maneuvers in the common femoral, femoral and popliteal veins. COMPARISON:  None. FINDINGS: Contralateral Common Femoral Vein: Respiratory phasicity is normal and symmetric with the symptomatic side. No evidence of thrombus. Normal compressibility. Common Femoral Vein: No evidence of thrombus. Normal compressibility, respiratory phasicity and response to augmentation. Saphenofemoral Junction: No evidence of thrombus. Normal compressibility and flow on color Doppler imaging. Profunda Femoral Vein: No evidence of thrombus. Normal compressibility and flow on color Doppler imaging. Femoral Vein: No evidence of thrombus. Normal compressibility, respiratory phasicity and response to augmentation. Popliteal Vein: No evidence of thrombus. Normal compressibility, respiratory phasicity and response to augmentation. Calf Veins: No evidence of thrombus. Normal compressibility and flow on color Doppler imaging. Superficial Great Saphenous Vein: No evidence of thrombus. Normal compressibility and flow on color Doppler imaging. Venous Reflux:  None. Other Findings:  None. IMPRESSION: No evidence of DVT within the left lower extremity. Electronically Signed   By: Sandi Mariscal M.D.   On: 04/15/2017 11:32     ASSESSMENT AND PLAN:   83 year old female with history of hypertension,  hyperlipidemia, hypothyroidism, depression, chronic kidney disease stage IV, history of diastolic CHF who presented to the hospital with chest pain and shortness of breath and noted to be in congestive heart failure.  1. CHF-acute on chronic diastolic dysfunction. -Continue diuresis with IV Lasix and improved since yesterday.  - about 1 L (-) since yesterday.  Hold B-blocker in case some degree of chronotropic incompetence and obstructive lung disease are worsened by beta blockade. - cont. Losartan, Hydralazine.  - appreciate Cardiology input and ?? Right heart cath if not improving with diuresis to assess volume status.   2. Hx of Chronic a. Fib - rate controlled and currently in NSR.   - cont. Eliquis  3. Essential HTN - cont. Losartan, Hydralazine  4. Hypothyroidism - cont. Synthroid.   5. COPD - no acute exacerbation.  - cont. Dulera, Robitussin as needed for cough.      All the records are reviewed and case discussed with Care Management/Social Worker. Management plans discussed with the patient, family and they are in agreement.  CODE STATUS: Full code  DVT Prophylaxis: Eliquis  TOTAL TIME TAKING CARE OF THIS PATIENT: 30 minutes.   POSSIBLE D/C IN 1-2 DAYS, DEPENDING ON CLINICAL CONDITION.   [name] M.D on 04/16/2017 at 2:43 PM  Between 7am to 6pm - Pager - [phone]  After 6pm go to [email or web address] - Proofreader  [facility]  Office  [phone]  CC: Primary care physician; [name], MD

## 2017-04-17 ENCOUNTER — Inpatient Hospital Stay (HOSPITAL_BASED_OUTPATIENT_CLINIC_OR_DEPARTMENT_OTHER): Payer: PPO

## 2017-04-17 ENCOUNTER — Inpatient Hospital Stay: Payer: PPO

## 2017-04-17 ENCOUNTER — Ambulatory Visit: Payer: PPO | Admitting: Internal Medicine

## 2017-04-17 ENCOUNTER — Encounter: Payer: Self-pay | Admitting: Radiology

## 2017-04-17 DIAGNOSIS — I5033 Acute on chronic diastolic (congestive) heart failure: Secondary | ICD-10-CM

## 2017-04-17 DIAGNOSIS — I481 Persistent atrial fibrillation: Secondary | ICD-10-CM

## 2017-04-17 DIAGNOSIS — I10 Essential (primary) hypertension: Secondary | ICD-10-CM | POA: Diagnosis not present

## 2017-04-17 LAB — NM MYOCAR MULTI W/SPECT W/WALL MOTION / EF
CHL CUP RESTING HR STRESS: 58 {beats}/min
CHL CUP STRESS STAGE 1 SPEED: 0 mph
CHL CUP STRESS STAGE 2 DBP: 46 mmHg
CHL CUP STRESS STAGE 2 GRADE: 0 %
CHL CUP STRESS STAGE 2 HR: 62 {beats}/min
CSEPPMHR: 50 %
Estimated workload: 1 METS
LV dias vol: 74 mL (ref 46–106)
LV sys vol: 26 mL
NUC STRESS TID: 1.02
Peak HR: 68 {beats}/min
Percent HR: 55 %
SDS: 0
SRS: 7
SSS: 2
Stage 1 Grade: 0 %
Stage 1 HR: 60 {beats}/min
Stage 2 SBP: 111 mmHg
Stage 2 Speed: 0 mph

## 2017-04-17 LAB — BASIC METABOLIC PANEL
ANION GAP: 4 — AB (ref 5–15)
BUN: 28 mg/dL — AB (ref 6–20)
CO2: 26 mmol/L (ref 22–32)
Calcium: 8.6 mg/dL — ABNORMAL LOW (ref 8.9–10.3)
Chloride: 110 mmol/L (ref 101–111)
Creatinine, Ser: 1.75 mg/dL — ABNORMAL HIGH (ref 0.44–1.00)
GFR calc Af Amer: 29 mL/min — ABNORMAL LOW (ref >60)
GFR, EST NON AFRICAN AMERICAN: 25 mL/min — AB (ref >60)
Glucose, Bld: 104 mg/dL — ABNORMAL HIGH (ref 65–99)
POTASSIUM: 4.3 mmol/L (ref 3.5–5.1)
SODIUM: 140 mmol/L (ref 135–145)

## 2017-04-17 MED ORDER — TECHNETIUM TC 99M TETROFOSMIN IV KIT
30.0000 | PACK | Freq: Once | INTRAVENOUS | Status: AC | PRN
  Administered 2017-04-17: 30.34 via INTRAVENOUS

## 2017-04-17 MED ORDER — FUROSEMIDE 40 MG PO TABS
40.0000 mg | ORAL_TABLET | Freq: Every day | ORAL | Status: DC
Start: 2017-04-18 — End: 2017-04-18

## 2017-04-17 MED ORDER — PANTOPRAZOLE SODIUM 40 MG PO TBEC
40.0000 mg | DELAYED_RELEASE_TABLET | Freq: Two times a day (BID) | ORAL | Status: DC
  Administered 2017-04-17 – 2017-04-19 (×4): 40 mg via ORAL
  Filled 2017-04-17 (×4): qty 1

## 2017-04-17 MED ORDER — APIXABAN 2.5 MG PO TABS
2.5000 mg | ORAL_TABLET | Freq: Two times a day (BID) | ORAL | Status: DC
  Administered 2017-04-17: 2.5 mg via ORAL
  Filled 2017-04-17: qty 1

## 2017-04-17 MED ORDER — REGADENOSON 0.4 MG/5ML IV SOLN
0.4000 mg | Freq: Once | INTRAVENOUS | Status: DC
  Filled 2017-04-17: qty 5

## 2017-04-17 MED ORDER — FUROSEMIDE 10 MG/ML IJ SOLN: 20.0000 mg | Freq: Every day | INTRAMUSCULAR | Status: DC

## 2017-04-17 MED ORDER — IPRATROPIUM-ALBUTEROL 0.5-2.5 (3) MG/3ML IN SOLN
3.0000 mL | Freq: Once | RESPIRATORY_TRACT | Status: AC
  Administered 2017-04-17: 3 mL via RESPIRATORY_TRACT
  Filled 2017-04-17: qty 3

## 2017-04-17 MED ORDER — TECHNETIUM TC 99M TETROFOSMIN IV KIT
13.0000 | PACK | Freq: Once | INTRAVENOUS | Status: AC | PRN
  Administered 2017-04-17: 13 via INTRAVENOUS

## 2017-04-17 NOTE — Progress Notes (Signed)
Patient Name: Savannah Wilson Date of Encounter: 04/17/2017  Primary Cardiologist: [facility] Problem List     Active Problems:   CHF exacerbation ([place])     Subjective   Feels like her SOB is slightly improved. Remains on supplemental oxygen via nasal cannula at 1 L. Renal function has trended up to 1.75 from 1.44 with IV diuresis. Documented UOP of 1.4 L for the admission and -800 mL for the past 24 hours. Weight stable at 140 pounds this morning. Patient feels like she has been voiding well. No further chest pain since 9/9. Slept well. NPO for possible Lexiscan Myoview this morning.   Inpatient Medications    Scheduled Meds: . ALPRAZolam  0.5 mg Oral QHS  . apixaban  5 mg Oral BID  . aspirin EC  81 mg Oral QODAY  . cholecalciferol  1,000 Units Oral Daily  . fluticasone  2 spray Each Nare Daily  . furosemide  40 mg Intravenous Daily  . hydrALAZINE  50 mg Oral BID  . levothyroxine  100 mcg Oral Daily  . losartan  100 mg Oral Daily  . mometasone-formoterol  2 puff Inhalation BID  . nystatin  5 mL Oral QID  . pantoprazole  40 mg Oral Daily  . sodium chloride flush  3 mL Intravenous Q12H  . vitamin B-12  1,000 mcg Oral Daily   Continuous Infusions:  PRN Meds: acetaminophen **OR** acetaminophen, guaiFENesin-dextromethorphan, hydrALAZINE, loperamide, ondansetron **OR** ondansetron (ZOFRAN) IV, pramipexole, temazepam   Vital Signs    Vitals:   04/16/17 2209 04/16/17 2212 04/17/17 0341 04/17/17 0725  BP: (!) 140/42 (!) 140/42 (!) 136/49 129/64  Pulse:  (!) 57 (!) 59 63  Resp:   18 18  Temp:   98 F (36.7 C)   TempSrc:      SpO2:   99% 99%  Weight:   140 lb 6.9 oz (63.7 kg)   Height:        Intake/Output Summary (Last 24 hours) at 04/17/17 1033 Last data filed at 04/16/17 2219  Gross per 24 hour  Intake                0 ml  Output              800 ml  Net             -800 ml   Filed Weights   04/16/17 0404 04/16/17 0844 04/17/17 0341  Weight: 143 lb 3.2  oz (65 kg) 140 lb 3.2 oz (63.6 kg) 140 lb 6.9 oz (63.7 kg)    Physical Exam    GEN: Well nourished, well developed, in no acute distress.  HEENT: Grossly normal.  Neck: Supple, JVD elevated ~ 8 cm, no carotid bruits, or masses. Cardiac: RRR, II/VI systolic murmur LLSB, no rubs, or gallops. No clubbing, cyanosis, edema.  Radials/DP/PT 2+ and equal bilaterally.  Respiratory:  Diminished breath sounds bilateral bases. GI: Soft, nontender, nondistended, BS + x 4. MS: no deformity or atrophy. Skin: warm and dry, no rash. Neuro:  Strength and sensation are intact. Psych: AAOx3.  Normal affect.  Labs    CBC  Recent Labs  04/15/17 0940 04/16/17 0413  WBC 5.0 5.1  NEUTROABS 3.3  --   HGB 10.2* 10.0*  HCT 30.1* 29.9*  MCV 90.7 91.7  PLT 280 476   Basic Metabolic Panel  Recent Labs  04/16/17 0413 04/17/17 0411  NA 141 140  K 3.6 4.3  CL 111  110  CO2 24 26  GLUCOSE 104* 104*  BUN 28* 28*  CREATININE 1.44* 1.75*  CALCIUM 8.8* 8.6*  MG 1.8  --    Liver Function Tests No results for input(s): AST, ALT, ALKPHOS, BILITOT, PROT, ALBUMIN in the last 72 hours. No results for input(s): LIPASE, AMYLASE in the last 72 hours. Cardiac Enzymes  Recent Labs  04/15/17 1624 04/15/17 2142 04/16/17 0413  TROPONINI 0.03* 0.03* 0.03*   BNP Invalid input(s): POCBNP D-Dimer No results for input(s): DDIMER in the last 72 hours. Hemoglobin A1C No results for input(s): HGBA1C in the last 72 hours. Fasting Lipid Panel No results for input(s): CHOL, HDL, LDLCALC, TRIG, CHOLHDL, LDLDIRECT in the last 72 hours. Thyroid Function Tests No results for input(s): TSH, T4TOTAL, T3FREE, THYROIDAB in the last 72 hours.  Invalid input(s): FREET3  Telemetry    Sinus rhythm to sinus bradycardia, occasional PACs/PVCs - Personally Reviewed  ECG    n/a - Personally Reviewed  Radiology    US Venous Img Lower Unilateral Left  Result Date: 04/15/2017 IMPRESSION: No evidence of DVT within the  left lower extremity. Electronically Signed   By: Sandi Mariscal M.D.   On: 04/15/2017 11:32    Cardiac Studies   TTE 02/2017: Study Conclusions  - Left ventricle: The cavity size was normal. There was mild   concentric hypertrophy. Systolic function was normal. The   estimated ejection fraction was in the range of 55% to 60%. Wall   motion was normal; there were no regional wall motion   abnormalities. - Mitral valve: Calcified annulus. There was moderate   regurgitation. - Left atrium: The atrium was moderately dilated. - Pulmonary arteries: Systolic pressure could not be accurately   estimated.  Patient Profile     83 y.o. female with history of recently formally diagnosed Afib in 01/2017 on Eliquis (though uncertain chronicity of Afib) s/p recent successful DCCV on 03/28/2017, CKD stage IV, asthma/COPD with mild confirmed obstructive lung disease by PFT in 2012, HTN, hypothyroidism on replacement therapy, HLD, anemia of chronic disease, venous insufficiency, Barrett's esophagus, and GAD complicated by the grief over the loss of her daughter who was admitted with increased SOB with volume overload.   Assessment & Plan    1. Acute on chronic diastolic CHF: -She does continue to appear mildly volume overloaded with elevated JVD -Continue gentle IV diuresis as below with close monitoring of renal function -Lexican Myoview as below -If symptoms persist, consider RHC on 9/11 to better assess volume status prior to discharge  -Daily weights, strict Is and Os  2. Persistent Afib: -She remains in sinus rhythm -Metoprolol has been discontinued given chronotropic incompetence and wheezing noted on 9/9 -Continue Eliquis, renal function now >1.5. With her renal function and age > 80 years, decrease Eliquis back to 2.5 mg bid, consider changing to Xarelto   3. Chest pain: -Resolved -Troponin minimally elevated at 0.03 -For Lexiscan Myoview this morning to evaluate for high-risk ischemia -On  Eliquis in place of ASA -Consider adding Imdur  4. HTN: -Controlled -Continue current medications  5. NSVT: -Occasional PVCs noted on telemetry, no ventricular runs -Replete magnesium to goal > 2.0 -Potassium at goal -Recent TSH in 01/2017 normal  6. Acute on CKD stage III-IV: -She has already received IV Lasix for this morning -Decrease IV Lasix to 20 mg daily for 9/11 -Monitor -Lexiscan Myoview as above -Hold losartan    Signed, Christell Faith, PA-C [place] Pager: [phone] 04/17/2017, 10:33 AM

## 2017-04-17 NOTE — Progress Notes (Addendum)
Patient admitted on 04/15/2017 with dx of CHF - Acute on Chronic Diastolic Dysfunction.  BNP on admission was elevated at 1460.  Patient has hx of Chronic A-fib, essential HTN, hypothyroidism, CKD Stage IV, and COPD.  Echo performed on 02/14/2017 revealed EF 55% - 60%.  Patient's daughter at bedside as well as patient's pastor.  Patient gave permission for me to speak in front of her daughter and pastor.  Patient has booklet, "Living Better with Heart Failure," at bedside.  Patient and daughter informed me that she has never been diagnosed with heart failure before or they were not aware.  Reviewed the definition of CHF and EF measurement. Explained to patient that HF is a chronic illness and one that has to be self managed along with help from cardiologists.   I stressed the importance of weighing oneself daily and assessing symptoms according to HF Zones. Note:  Daughter has scales that she will provide the patient.   Reviewed the HF zones and when to call the doctor based on her symptoms.  Reviewed the purpose of the [facility] and the protocol to have HF patient's seen in the HF Clinic within 7 days of discharge.  Patient requesting to cancel this appointment, as she stated she would feel better just being seen by Dr. Fletcher Anon, Christell Faith, PA or Dr. Saunders Revel. Discussed the importance of following low sodium, low fat, low cholesterol heart healthy diet.  Patient and daughter stated patient has been watching her sodium intake due to her CKD Stage IV.  Dietitian Consult ordered for diet education.   Patient had lexiscan stress test today, but she does not know the results yet.  HF medications in general discussed.  Patient stated she was on lasix prior to admission.  Patient informed this RN that she is always compliant with taking medications that are prescribed.  Exercise also discussed.  Patient spoke up and stated, "I still drive."  I informed patient and daughter that based on her diagnosis and EF she does not  qualify for Cardiac Rehab, but does qualify for Pulmonary Rehab.  Overview of the Pulmonary Rehab / [facility] program provided. Patient and Daughter had no questions.    I informed patient and daughter that I would follow-up with the patient  tomorrow to see if she had any questions.  Message sent to HF Clinic requesting them to cancel patient's HF Clinc appointment.     Roanna Epley, RN, BSN, [facility] Cardiovascular and Pulmonary Nurse Navigator [phone]

## 2017-04-17 NOTE — Progress Notes (Addendum)
Patient c/o of sudden sob. Oxygen saturations 97% on 2L, lungs sounds diminished, unchanged from initial assessment. MD Jannifer Franklin made aware. MD to place orders. Will continue to monitor.   Savannah Wilson

## 2017-04-17 NOTE — Progress Notes (Signed)
[place] [facility] [place] NAME: Savannah Wilson    MR#:  [id number]  DATE OF BIRTH:  1929/04/28  SUBJECTIVE:   Patient here due to shortness of breath and chest pain and noted to be in congestive heart failure. Improving with IV diuresis.  No chest pain. Had Stress test this a.m. And results are pending.  No other acute events overnight.   REVIEW OF SYSTEMS:    Review of Systems  Constitutional: Negative for chills and fever.  HENT: Negative for congestion and tinnitus.   Eyes: Negative for blurred vision and double vision.  Respiratory: Positive for shortness of breath (improved). Negative for cough and wheezing.   Cardiovascular: Negative for chest pain, orthopnea and PND.  Gastrointestinal: Negative for abdominal pain, diarrhea, nausea and vomiting.  Genitourinary: Negative for dysuria and hematuria.  Neurological: Negative for dizziness, sensory change and focal weakness.  All other systems reviewed and are negative.   Nutrition: Heart healthy Tolerating Diet: yes Tolerating PT: Eval noted.    DRUG ALLERGIES:   Allergies  Allergen Reactions  . Ace Inhibitors Cough  . Benicar [Olmesartan Medoxomil]     Unsure of reaction type  . Clarithromycin Other (See Comments)    Blisters in mouth  . Clarithromycin Other (See Comments)    Unknown   . Norvasc [Amlodipine Besylate]   . Levofloxacin Rash  . Zithromax [Azithromycin Dihydrate] Rash    VITALS:  Blood pressure (!) 128/42, pulse 82, temperature 97.7 F (36.5 C), temperature source Oral, resp. rate 18, height 5' (1.524 m), weight 63.7 kg (140 lb 6.9 oz), SpO2 99 %.  PHYSICAL EXAMINATION:   Physical Exam  GENERAL:  83 y.o.-year-old patient lying in bed in no acute distress.  EYES: Pupils equal, round, reactive to light and accommodation. No scleral icterus. Extraocular muscles intact.  HEENT: Head atraumatic, normocephalic. Oropharynx and nasopharynx clear.  NECK:  Supple, no  jugular venous distention. No thyroid enlargement, no tenderness.  LUNGS: Normal breath sounds bilaterally, no wheezing, rales, No rhonchi. No use of accessory muscles of respiration.  CARDIOVASCULAR: S1, S2 normal. No murmurs, rubs, or gallops.  ABDOMEN: Soft, nontender, nondistended. Bowel sounds present. No organomegaly or mass.  EXTREMITIES: No cyanosis, clubbing , Trace edema b/l    NEUROLOGIC: Cranial nerves II through XII are intact. No focal Motor or sensory deficits b/l.   PSYCHIATRIC: The patient is alert and oriented x 3.  SKIN: No obvious rash, lesion, or ulcer.    LABORATORY PANEL:   CBC  Recent Labs Lab 04/16/17 0413  WBC 5.1  HGB 10.0*  HCT 29.9*  PLT 260   ------------------------------------------------------------------------------------------------------------------  Chemistries   Recent Labs Lab 04/16/17 0413 04/17/17 0411  NA 141 140  K 3.6 4.3  CL 111 110  CO2 24 26  GLUCOSE 104* 104*  BUN 28* 28*  CREATININE 1.44* 1.75*  CALCIUM 8.8* 8.6*  MG 1.8  --    ------------------------------------------------------------------------------------------------------------------  Cardiac Enzymes  Recent Labs Lab 04/16/17 0413  TROPONINI 0.03*   ------------------------------------------------------------------------------------------------------------------  RADIOLOGY:  No results found.   ASSESSMENT AND PLAN:   83 year old female with history of hypertension, hyperlipidemia, hypothyroidism, depression, chronic kidney disease stage IV, history of diastolic CHF who presented to the hospital with chest pain and shortness of breath and noted to be in congestive heart failure.  1. CHF-acute on chronic diastolic dysfunction. -Continue diuresis with IV Lasix and much improved since admission.  - about 2 L (-) since admission.  Hold B-blocker  in case some degree of chronotropic incompetence and obstructive lung disease are worsened by beta blockade. -  cont. Losartan, Hydralazine.  - appreciate Cardiology input and ?? Right heart cath if not improving.   2. Chest Pain - due to CHF. CE X 3 have been (-).  - had stress test today and will follow up.   3. Hx of Chronic a. Fib - rate controlled and currently in NSR.   - cont. Eliquis.  4. Essential HTN - cont. Losartan, Hydralazine  5. Hypothyroidism - cont. Synthroid.   6. COPD - no acute exacerbation.  - cont. Dulera, Robitussin as needed for cough.    All the records are reviewed and case discussed with Care Management/Social Worker. Management plans discussed with the patient, family and they are in agreement.  CODE STATUS: Full code  DVT Prophylaxis: Eliquis  TOTAL TIME TAKING CARE OF THIS PATIENT: 30 minutes.   POSSIBLE D/C IN 1-2 DAYS, DEPENDING ON CLINICAL CONDITION.   [name] M.D on 04/17/2017 at 3:18 PM  Between 7am to 6pm - Pager - [phone]  After 6pm go to [email or web address] - Proofreader  [employer] [facility]  Office  [phone]  CC: Primary care physician; [name], MD

## 2017-04-17 NOTE — Care Management (Signed)
Patient with recent cardioversion for atrial fibrillation, recent outpatient treatment for pneumonia and had had changes in her lasix.  Failed outpatient measures to resolve symptoms and admitted with congestive heart failure.  Patient's current need for oxygen is acute.  Discussed need to perform home oxygen assessment prior to discharge discussed in progression.  Patient lives alone and in the presence of her daughter, declined need for any home health.  Discussed she may benefit from a home health nurse to follow up heart failure and home health physical therapy was recommended by therapy.  Patient at present is declining home health services. CM unable to discuss further as patient to be transferred off unit for testing.

## 2017-04-18 ENCOUNTER — Inpatient Hospital Stay (HOSPITAL_COMMUNITY)
Admit: 2017-04-18 | Discharge: 2017-04-18 | Disposition: A | Discharge status: Home / Self Care | Payer: PPO | Attending: Internal Medicine | Admitting: Internal Medicine

## 2017-04-18 DIAGNOSIS — I5031 Acute diastolic (congestive) heart failure: Secondary | ICD-10-CM

## 2017-04-18 DIAGNOSIS — J90 Pleural effusion, not elsewhere classified: Secondary | ICD-10-CM

## 2017-04-18 DIAGNOSIS — N184 Chronic kidney disease, stage 4 (severe): Secondary | ICD-10-CM

## 2017-04-18 DIAGNOSIS — I2510 Atherosclerotic heart disease of native coronary artery without angina pectoris: Secondary | ICD-10-CM

## 2017-04-18 LAB — CBC
HCT: 29.1 % — ABNORMAL LOW (ref 35.0–47.0)
HEMOGLOBIN: 9.7 g/dL — AB (ref 12.0–16.0)
MCH: 30.4 pg (ref 26.0–34.0)
MCHC: 33.3 g/dL (ref 32.0–36.0)
MCV: 91.2 fL (ref 80.0–100.0)
PLATELETS: 260 10*3/uL (ref 150–440)
RBC: 3.19 MIL/uL — AB (ref 3.80–5.20)
RDW: 14.1 % (ref 11.5–14.5)
WBC: 6.4 10*3/uL (ref 3.6–11.0)

## 2017-04-18 LAB — BASIC METABOLIC PANEL
ANION GAP: 6 (ref 5–15)
BUN: 36 mg/dL — ABNORMAL HIGH (ref 6–20)
CALCIUM: 8.4 mg/dL — AB (ref 8.9–10.3)
CO2: 24 mmol/L (ref 22–32)
CREATININE: 1.99 mg/dL — AB (ref 0.44–1.00)
Chloride: 110 mmol/L (ref 101–111)
GFR calc non Af Amer: 22 mL/min — ABNORMAL LOW (ref >60)
GFR, EST AFRICAN AMERICAN: 25 mL/min — AB (ref >60)
GLUCOSE: 109 mg/dL — AB (ref 65–99)
Potassium: 3.8 mmol/L (ref 3.5–5.1)
Sodium: 140 mmol/L (ref 135–145)

## 2017-04-18 LAB — HEPARIN LEVEL (UNFRACTIONATED)
Heparin Unfractionated: >3.6 IU/mL — ABNORMAL HIGH (ref 0.30–0.70)
Heparin Unfractionated: >3.6 IU/mL — ABNORMAL HIGH (ref 0.30–0.70)

## 2017-04-18 LAB — PROTIME-INR
INR: 1.48
PROTHROMBIN TIME: 17.8 s — AB (ref 11.4–15.2)

## 2017-04-18 LAB — APTT
APTT: 73 s — AB (ref 24–36)
aPTT: 64 seconds — ABNORMAL HIGH (ref 24–36)

## 2017-04-18 MED ORDER — HEPARIN (PORCINE) IN NACL 100-0.45 UNIT/ML-% IJ SOLN
1000.0000 [IU]/h | INTRAMUSCULAR | Status: DC
  Administered 2017-04-18: 900 [IU]/h via INTRAVENOUS
  Administered 2017-04-18 – 2017-04-19 (×2): 1000 [IU]/h via INTRAVENOUS
  Filled 2017-04-18 (×2): qty 250

## 2017-04-18 NOTE — Progress Notes (Signed)
[place] [facility] [place] NAME: Savannah Wilson    MR#:  [id number]  DATE OF BIRTH:  09-02-1928  SUBJECTIVE:   Patient here due to shortness of breath and chest pain and noted to be in congestive heart failure. Improving with IV diuresis.  Myoview yesterday was intermediate risk w/out any evidence of ischemia.  No chest pain.  Shortness of breath improved.    REVIEW OF SYSTEMS:    Review of Systems  Constitutional: Negative for chills and fever.  HENT: Negative for congestion and tinnitus.   Eyes: Negative for blurred vision and double vision.  Respiratory: Positive for shortness of breath (improved). Negative for cough and wheezing.   Cardiovascular: Negative for chest pain, orthopnea and PND.  Gastrointestinal: Negative for abdominal pain, diarrhea, nausea and vomiting.  Genitourinary: Negative for dysuria and hematuria.  Neurological: Negative for dizziness, sensory change and focal weakness.  All other systems reviewed and are negative.   Nutrition: Heart healthy Tolerating Diet: yes Tolerating PT: Eval noted.    DRUG ALLERGIES:   Allergies  Allergen Reactions  . Ace Inhibitors Cough  . Benicar [Olmesartan Medoxomil]     Unsure of reaction type  . Clarithromycin Other (See Comments)    Blisters in mouth  . Clarithromycin Other (See Comments)    Unknown   . Norvasc [Amlodipine Besylate]   . Levofloxacin Rash  . Zithromax [Azithromycin Dihydrate] Rash    VITALS:  Blood pressure (!) 152/46, pulse 62, temperature 97.9 F (36.6 C), temperature source Oral, resp. rate 20, height 5' (1.524 m), weight 64.1 kg (141 lb 4.8 oz), SpO2 99 %.  PHYSICAL EXAMINATION:   Physical Exam  GENERAL:  83 y.o.-year-old patient lying in bed in no acute distress.  EYES: Pupils equal, round, reactive to light and accommodation. No scleral icterus. Extraocular muscles intact.  HEENT: Head atraumatic, normocephalic. Oropharynx and nasopharynx clear.   NECK:  Supple, no jugular venous distention. No thyroid enlargement, no tenderness.  LUNGS: Normal breath sounds bilaterally, no wheezing, rales, No rhonchi. No use of accessory muscles of respiration.  CARDIOVASCULAR: S1, S2 normal. No murmurs, rubs, or gallops.  ABDOMEN: Soft, nontender, nondistended. Bowel sounds present. No organomegaly or mass.  EXTREMITIES: No cyanosis, clubbing , Trace edema b/l    NEUROLOGIC: Cranial nerves II through XII are intact. No focal Motor or sensory deficits b/l.   PSYCHIATRIC: The patient is alert and oriented x 3.  SKIN: No obvious rash, lesion, or ulcer.    LABORATORY PANEL:   CBC  Recent Labs Lab 04/18/17 0951  WBC 6.4  HGB 9.7*  HCT 29.1*  PLT 260   ------------------------------------------------------------------------------------------------------------------  Chemistries   Recent Labs Lab 04/16/17 0413  04/18/17 0607  NA 141  < > 140  K 3.6  < > 3.8  CL 111  < > 110  CO2 24  < > 24  GLUCOSE 104*  < > 109*  BUN 28*  < > 36*  CREATININE 1.44*  < > 1.99*  CALCIUM 8.8*  < > 8.4*  MG 1.8  --   --   < > = values in this interval not displayed. ------------------------------------------------------------------------------------------------------------------  Cardiac Enzymes  Recent Labs Lab 04/16/17 0413  TROPONINI 0.03*   ------------------------------------------------------------------------------------------------------------------  RADIOLOGY:  Nm Myocar Multi W/spect W/wall Motion / Ef  Result Date: 04/17/2017  T wave inversion was noted during stress in the III, V3 and V4 leads.  There was no ST segment deviation noted during stress.  Defect 1: There is a medium defect of severe severity present in the basal inferior, basal inferolateral, mid inferior and mid inferolateral location.  Findings consistent with prior inferior/inferolateral myocardial infarction with peri-infarct ischemia.  This is an intermediate  risk study.  The left ventricular ejection fraction is normal (55-65%).  Severe hypotension with Lexiscan which was reversed with Aminophyllin. 500 mL of saline bolus was given with resolution of hypotension.    Dg Chest Port 1 View  Result Date: 04/17/2017 CLINICAL DATA:  83 y/o  F; cough. EXAM: PORTABLE CHEST 1 VIEW COMPARISON:  04/15/2017 chest radiograph FINDINGS: Stable normal cardiac silhouette given projection and technique. Aortic atherosclerosis with calcification. Stable small right pleural effusion. Diminished left pleural effusion. Associated bibasilar atelectasis. No new focal consolidation. All all degenerative changes of the right glenohumeral joint and of the thoracic spine. IMPRESSION: Stable small right and diminished left pleural effusions with associated bibasilar atelectasis. Aortic atherosclerosis. Electronically Signed   By: Kristine Garbe M.D.   On: 04/17/2017 22:13     ASSESSMENT AND PLAN:   83 year old female with history of hypertension, hyperlipidemia, hypothyroidism, depression, chronic kidney disease stage IV, history of diastolic CHF who presented to the hospital with chest pain and shortness of breath and noted to be in congestive heart failure.  1. CHF-acute on chronic diastolic dysfunction. - much improved w/ IV diuresis.  Lasix held yesterday as Cr. Was rising.  - about 2 L (-) since admission.  Hold B-blocker in case some degree of chronotropic incompetence and obstructive lung disease are worsened by beta blockade. - cont. Losartan, Hydralazine.  - appreciate Cardiology input and they will review Echo today to better evaluate her IVC and Pulm. Artery pressures and if elevated consider right sided thoracentesis.    2. Chest Pain - due to CHF. CE X 3 have been (-).  - patient had a nuclear medicine stress test yesterday which was intermediate risk without any overt evidence of ischemia. Patient did have a findings of previous inferior/inferolateral  infarction. -Clinically asymptomatic presently.   3. Hx of Chronic a. Fib - rate controlled and currently in NSR.   - Eliquis being held and switched over to heparin drip and plan for possible thoracentesis.  4. Essential HTN - cont. Losartan, Hydralazine - BP stable.   5. Hypothyroidism - cont. Synthroid.   6. COPD - no acute exacerbation.  - cont. Dulera, Robitussin as needed for cough.    All the records are reviewed and case discussed with Care Management/Social Worker. Management plans discussed with the patient, family and they are in agreement.  CODE STATUS: Full code  DVT Prophylaxis: Eliquis  TOTAL TIME TAKING CARE OF THIS PATIENT: 30 minutes.   POSSIBLE D/C IN 1-2 DAYS, DEPENDING ON CLINICAL CONDITION.   [name] M.D on 04/18/2017 at 3:07 PM  Between 7am to 6pm - Pager - [phone]  After 6pm go to [email or web address] - Proofreader  [facility]  Office  [phone]  CC: Primary care physician; [name], MD

## 2017-04-18 NOTE — Progress Notes (Signed)
ANTICOAGULATION CONSULT NOTE - Initial Consult  Pharmacy Consult for Heparin  Indication: atrial fibrillation  Allergies  Allergen Reactions  . Ace Inhibitors Cough  . Benicar [Olmesartan Medoxomil]     Unsure of reaction type  . Clarithromycin Other (See Comments)    Blisters in mouth  . Clarithromycin Other (See Comments)    Unknown   . Norvasc [Amlodipine Besylate]   . Levofloxacin Rash  . Zithromax [Azithromycin Dihydrate] Rash    Patient Measurements: Height: 5' (152.4 cm) Weight: 141 lb 4.8 oz (64.1 kg) IBW/kg (Calculated) : 45.5 Heparin Dosing Weight:   Vital Signs: Temp: 98.1 F (36.7 C) (09/11 1923) Temp Source: Oral (09/11 1923) BP: 157/64 (09/11 1923) Pulse Rate: 71 (09/11 1923)  Labs:  Recent Labs  04/15/17 2142 04/16/17 0413 04/17/17 0411 04/18/17 0607 04/18/17 0951 04/18/17 1824  HGB  --  10.0*  --   --  9.7*  --   HCT  --  29.9*  --   --  29.1*  --   PLT  --  260  --   --  260  --   APTT  --   --   --   --  73* 64*  LABPROT  --   --   --   --  17.8*  --   INR  --   --   --   --  1.48  --   HEPARINUNFRC  --   --   --   --  >3.60*  --   CREATININE  --  1.44* 1.75* 1.99*  --   --   TROPONINI 0.03* 0.03*  --   --   --   --     Estimated Creatinine Clearance: 16.9 mL/min (A) (by C-G formula based on SCr of 1.99 mg/dL (H)).   Medical History: Past Medical History:  Diagnosis Date  . Asthma   . Barrett esophagus   . Chronic bronchitis   . Chronic kidney disease (CKD), stage IV (severe) ([place])   . Depression   . Diverticulosis   . GERD (gastroesophageal reflux disease)   . Gout   . Hyperlipidemia   . Hypertension   . Hypothyroid   . Mitral regurgitation    a. TTE 02/2017: EF 55-60%, normal WM, calcified mitral annulus with mod regurg, moderately dilated LA, unable to estimate PASP  . Pancreatitis due to biliary obstruction March 2010   s/p ERCP sphincterotomy, cholecystectomy  . Peripheral neuralgia   . Persistent atrial fibrillation  ([place])    a. diagnosed 01/24/17; CHADS2VASc => 5 (HTN, age x 2, vascular disease with PAD and aortic plaque, female) giving her an estimated annual stroke risk of 6.7%; b. successful DCCV 03/28/2017; c. on Eliquis  . Venous insufficiency     Medications:  Prescriptions Prior to Admission  Medication Sig Dispense Refill Last Dose  . acetaminophen (TYLENOL) 650 MG CR tablet Take 650 mg by mouth every 8 (eight) hours as needed for pain.    prn at prn  . ALPRAZolam (XANAX) 0.5 MG tablet Take 1.5 tablets (0.75 mg total) by mouth at bedtime as needed for anxiety. (Patient taking differently: Take 0.5 mg by mouth at bedtime. ) 30 tablet 5 prn at prn  . apixaban (ELIQUIS) 2.5 MG TABS tablet Take 1 tablet (2.5 mg total) by mouth 2 (two) times daily. 180 tablet 3 04/14/2017 at Unknown time  . aspirin EC 81 MG tablet Take 81 mg by mouth every other day. IN THE EVENING  Unknown at Unknown  . Cholecalciferol (VITAMIN D-3) 1000 units CAPS Take 1,000 Units by mouth daily.    04/14/2017 at Unknown time  . DULERA 100-5 MCG/ACT AERO USE 2 PUFFS TWICE DAILY. (Patient taking differently: USE 2 PUFFS TWICE DAILY AS NEEDED FOR RESPIRATORY ISSUES.) 13 g 3 prn at prn  . fluticasone (FLONASE) 50 MCG/ACT nasal spray Place 2 sprays into the nose daily. (Patient taking differently: Place 2 sprays into the nose daily as needed (For allergies.). ) 16 g 6 prn at prn  . furosemide (LASIX) 20 MG tablet Take 1 tablet (20 mg total) by mouth daily. 90 tablet 3 04/14/2017 at Unknown time  . hydrALAZINE (APRESOLINE) 25 MG tablet Take 1 tablet (25 mg total) by mouth 3 (three) times daily. (Patient taking differently: Take 25 mg by mouth daily. ) 30 tablet 3 04/14/2017 at Unknown time  . hydrALAZINE (APRESOLINE) 50 MG tablet Take 50 mg by mouth at bedtime.   04/14/2017 at Unknown time  . levothyroxine (SYNTHROID, LEVOTHROID) 100 MCG tablet TAKE ONE TABLET BY MOUTH EVERY DAY (Patient taking differently: TAKE ONE TABLET BY MOUTH IN THE MORNING 30  MINUTES BEFORE BREAKFAST) 90 tablet 1 04/15/2017 at Unknown time  . loperamide (IMODIUM A-D) 2 MG tablet Take 2 mg by mouth 4 (four) times daily as needed for diarrhea or loose stools.    prn at prn  . losartan (COZAAR) 100 MG tablet TAKE ONE TABLET BY MOUTH EVERY DAY (Patient taking differently: TAKE ONE TABLET BY MOUTH EVERY DAY AT NIGHT) 90 tablet 1 04/14/2017 at Unknown time  . metoprolol tartrate (LOPRESSOR) 25 MG tablet Take 0.5 tablets (12.5 mg total) by mouth 2 (two) times daily. 180 tablet 3 04/14/2017 at Unknown time  . omeprazole (PRILOSEC) 40 MG capsule TAKE ONE CAPSULE BY MOUTH TWICE A DAY. BEFORE BREAKFAST AND SUPPER (Patient taking differently: TAKE ONE CAPSULE BY MOUTH TWICE A DAY. 30 MINS BEFORE BREAKFAST AND SUPPER) 180 capsule 1 04/14/2017 at Unknown time  . pramipexole (MIRAPEX) 0.125 MG tablet Take 1 tablet (0.125 mg total) by mouth 3 (three) times daily. (Patient taking differently: Take 0.125 mg by mouth 3 (three) times daily as needed (for restless legs). ) 30 tablet 11 Unknown time at Unknown time  . PROAIR HFA 108 (90 BASE) MCG/ACT inhaler INHALE 2 PUFFS EVERY 6 HOURS AS NEEDED FOR WHEEZING 8.5 g 3 prn at prn  . Spacer/Aero-Holding Chambers (OPTICHAMBER ADVANTAGE) MISC 1 each by Other route once. Always uses her when you're using a metered-dose inhaler. You've aromatase medicine as much, he won't have his much side effect, but you it twice as much medicine and your lungs. 1 each 0 Unknown time at Unknown time  . temazepam (RESTORIL) 15 MG capsule Take 1 capsule (15 mg total) by mouth at bedtime as needed for sleep. 30 capsule 0 04/14/2017 at Unknown time  . vitamin B-12 (CYANOCOBALAMIN) 1000 MCG tablet Take 1,000 mcg by mouth daily.   04/14/2017 at Unknown time  . amoxicillin-clavulanate (AUGMENTIN) 875-125 MG tablet Take 1 tablet by mouth 2 (two) times daily. (Patient not taking: Reported on 04/15/2017) 14 tablet 0 Completed Course at Unknown time  . Beclomethasone Dipropionate 80 MCG/ACT  AERS Place 2 sprays into the nose daily. (Patient not taking: Reported on 04/15/2017) 8.7 g 11 Not Taking at prn  . nystatin (MYCOSTATIN) 100000 UNIT/ML suspension Take 5 mLs (500,000 Units total) by mouth 4 (four) times daily. (Patient not taking: Reported on 04/15/2017) 60 mL 0 Not Taking at Unknown  time  . nystatin (MYCOSTATIN/NYSTOP) powder Apply topically 4 (four) times daily. (Patient not taking: Reported on 04/15/2017) 15 g 0 Not Taking at Unknown time   Scheduled:  . ALPRAZolam  0.5 mg Oral QHS  . cholecalciferol  1,000 Units Oral Daily  . fluticasone  2 spray Each Nare Daily  . hydrALAZINE  50 mg Oral BID  . levothyroxine  100 mcg Oral Daily  . mometasone-formoterol  2 puff Inhalation BID  . nystatin  5 mL Oral QID  . pantoprazole  40 mg Oral BID  . regadenoson  0.4 mg Intravenous Once  . sodium chloride flush  3 mL Intravenous Q12H  . vitamin B-12  1,000 mcg Oral Daily   Infusions:  . heparin 900 Units/hr (04/18/17 4037)    Assessment: Pharmacy consulted to dose and monitor heparin in this 83 year old female for atrial fibrillation. Patient is chronicially on apixaban (last dose @ ~2100 on 9/10)   Goal of Therapy:  APTT:66-102 Heparin level 0.3-0.7 units/ml Monitor platelets by anticoagulation protocol: Yes   Plan:  Will hold off on bolusing patient. Will start Heparin gtt @ rate of 900 units/hr. Will need to check APTT and HL until levels correlate. First APTT and HL ordered to be drawn @ 1730   9/11 1915 aPTT 64. Level subtherapeutic. Will increase heparin drip to 1000u/hr and recheck aPTT and HL in 6 hours.   Pernell Dupre, PharmD, BCPS Clinical Pharmacist 04/18/2017 7:27 PM

## 2017-04-18 NOTE — Plan of Care (Signed)
Problem: Food- and Nutrition-Related Knowledge Deficit (NB-1.1) Goal: Nutrition education Formal process to instruct or train a patient/client in a skill or to impart knowledge to help patients/clients voluntarily manage or modify food choices and eating behavior to maintain or improve health.  Outcome: Adequate for Discharge Nutrition Education Note  RD consulted for nutrition education regarding a Heart Healthy diet.   Lipid Panel     Component Value Date/Time   CHOL 124 06/16/2016 0945   TRIG 87.0 06/16/2016 0945   HDL 42.50 06/16/2016 0945   CHOLHDL 3 06/16/2016 0945   VLDL 17.4 06/16/2016 0945   LDLCALC 64 06/16/2016 0945    RD provided "Heart Healthy Nutrition Therapy" handout from the Academy of [employer]etics. Reviewed patient's dietary recall. Provided examples on ways to decrease sodium and fat intake in diet. Discouraged intake of processed foods and use of salt shaker. Encouraged fresh fruits and vegetables as well as whole grain sources of carbohydrates to maximize fiber intake. Teach back method used.  Expect fair compliance.  Body mass index is 27.6 kg/m. Pt meets criteria for overweight based on current BMI.  Current diet order is heart healthy, patient is consuming approximately 80% of meals at this time. Labs and medications reviewed. No further nutrition interventions warranted at this time. RD contact information provided. If additional nutrition issues arise, please re-consult RD.  Savannah Wilson. Savannah Dambrosio, MS, RD LDN Inpatient Clinical Dietitian Pager [phone]
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
## 2017-04-18 NOTE — Care Management Important Message (Signed)
Important Message  Patient Details  Name: Savannah Wilson MRN: [id number] Date of Birth: 08-28-1928   Medicare Important Message Given:  Yes Signed IM notice given  Katrina Stack, RN 04/18/2017, 3:08 PM

## 2017-04-18 NOTE — Progress Notes (Signed)
Patient Name: Savannah Wilson Date of Encounter: 04/18/2017  Primary Cardiologist: [facility] Problem List     Active Problems:   CHF exacerbation [facility])     Subjective   Did not sleep overnight. Remains on 2 L via nasal cannula. No chest pain. Documented UOP of 2.3 L for the admission and 810 mL for the past 24 hours. Underwent Lexiscan Myoview on 9/10 that showed a medium defect of severe severity present int he basal inferior, basal inferolateral, mid inferior and mid inferolateral location. Findings consistent with prior inferior/inferolateral myocardial infarction with peri-infarct ischemia. EF 55-65%. There was severe hypotension with Lexiscan which was reversed with Aminophyllin and 500 mL NS bolus. Overall, this was an intermediate risk study. Renal function continues to increase.   Inpatient Medications    Scheduled Meds: . ALPRAZolam  0.5 mg Oral QHS  . apixaban  2.5 mg Oral BID  . cholecalciferol  1,000 Units Oral Daily  . fluticasone  2 spray Each Nare Daily  . furosemide  40 mg Oral Daily  . hydrALAZINE  50 mg Oral BID  . levothyroxine  100 mcg Oral Daily  . mometasone-formoterol  2 puff Inhalation BID  . nystatin  5 mL Oral QID  . pantoprazole  40 mg Oral BID  . regadenoson  0.4 mg Intravenous Once  . sodium chloride flush  3 mL Intravenous Q12H  . vitamin B-12  1,000 mcg Oral Daily   Continuous Infusions:  PRN Meds: acetaminophen **OR** acetaminophen, guaiFENesin-dextromethorphan, hydrALAZINE, loperamide, ondansetron **OR** ondansetron (ZOFRAN) IV, pramipexole, temazepam   Vital Signs    Vitals:   04/17/17 1315 04/17/17 1914 04/17/17 2045 04/18/17 0330  BP: (!) 128/42 (!) 140/56  (!) 130/35  Pulse: 82 70  63  Resp: 18 18  18   Temp: 97.7 F (36.5 C) 98.6 F (37 C)  98 F (36.7 C)  TempSrc: Oral Oral  Oral  SpO2: 99% 97% 96% 99%  Weight:    141 lb 4.8 oz (64.1 kg)  Height:        Intake/Output Summary (Last 24 hours) at 04/18/17 0717 Last  data filed at 04/18/17 0100  Gross per 24 hour  Intake              240 ml  Output              900 ml  Net             -660 ml   Filed Weights   04/16/17 0844 04/17/17 0341 04/18/17 0330  Weight: 140 lb 3.2 oz (63.6 kg) 140 lb 6.9 oz (63.7 kg) 141 lb 4.8 oz (64.1 kg)    Physical Exam    GEN: Well nourished, well developed, in no acute distress.  HEENT: Grossly normal.  Neck: Supple, no JVD, carotid bruits, or masses. Cardiac: RRR, no murmurs, rubs, or gallops. No clubbing, cyanosis, edema.  Radials/DP/PT 2+ and equal bilaterally.  Respiratory:  Respirations regular and unlabored, clear to auscultation bilaterally. GI: Soft, nontender, nondistended, BS + x 4. MS: no deformity or atrophy. Skin: warm and dry, no rash. Neuro:  Strength and sensation are intact. Psych: AAOx3.  Normal affect.  Labs    CBC  Recent Labs  04/15/17 0940 04/16/17 0413  WBC 5.0 5.1  NEUTROABS 3.3  --   HGB 10.2* 10.0*  HCT 30.1* 29.9*  MCV 90.7 91.7  PLT 280 096   Basic Metabolic Panel  Recent Labs  04/16/17 0413 04/17/17 0411 04/18/17  0607  NA 141 140 140  K 3.6 4.3 3.8  CL 111 110 110  CO2 24 26 24   GLUCOSE 104* 104* 109*  BUN 28* 28* 36*  CREATININE 1.44* 1.75* 1.99*  CALCIUM 8.8* 8.6* 8.4*  MG 1.8  --   --    Liver Function Tests No results for input(s): AST, ALT, ALKPHOS, BILITOT, PROT, ALBUMIN in the last 72 hours. No results for input(s): LIPASE, AMYLASE in the last 72 hours. Cardiac Enzymes  Recent Labs  04/15/17 1624 04/15/17 2142 04/16/17 0413  TROPONINI 0.03* 0.03* 0.03*   BNP Invalid input(s): POCBNP D-Dimer No results for input(s): DDIMER in the last 72 hours. Hemoglobin A1C No results for input(s): HGBA1C in the last 72 hours. Fasting Lipid Panel No results for input(s): CHOL, HDL, LDLCALC, TRIG, CHOLHDL, LDLDIRECT in the last 72 hours. Thyroid Function Tests No results for input(s): TSH, T4TOTAL, T3FREE, THYROIDAB in the last 72 hours.  Invalid  input(s): FREET3  Telemetry    Sinus rhythm to sinus bradycardia, occasional PVCs - Personally Reviewed  ECG    n/a - Personally Reviewed  Radiology    Nm Myocar Multi W/spect W/wall Motion / Ef  Result Date: 04/17/2017  T wave inversion was noted during stress in the III, V3 and V4 leads.  There was no ST segment deviation noted during stress.  Defect 1: There is a medium defect of severe severity present in the basal inferior, basal inferolateral, mid inferior and mid inferolateral location.  Findings consistent with prior inferior/inferolateral myocardial infarction with peri-infarct ischemia.  This is an intermediate risk study.  The left ventricular ejection fraction is normal (55-65%).  Severe hypotension with Lexiscan which was reversed with Aminophyllin. 500 mL of saline bolus was given with resolution of hypotension.    Dg Chest Port 1 View  Result Date: 04/17/2017 IMPRESSION: Stable small right and diminished left pleural effusions with associated bibasilar atelectasis. Aortic atherosclerosis. Electronically Signed   By: Kristine Garbe M.D.   On: 04/17/2017 22:13    Cardiac Studies   Lexiscan as above.  TTE 02/2017: Study Conclusions  - Left ventricle: The cavity size was normal. There was mild concentric hypertrophy. Systolic function was normal. The estimated ejection fraction was in the range of 55% to 60%. Wall motion was normal; there were no regional wall motion abnormalities. - Mitral valve: Calcified annulus. There was moderate regurgitation. - Left atrium: The atrium was moderately dilated. - Pulmonary arteries: Systolic pressure could not be accurately estimated.  Patient Profile     83 y.o. female with history of recently formally diagnosed Afib in 01/2017 on Eliquis (though uncertain chronicity of Afib) s/p recent successful DCCV on 03/28/2017, CKD stage IV, asthma/COPD with mild confirmed obstructive lung disease by PFT in  2012, HTN, hypothyroidism on replacement therapy, HLD, anemia of chronic disease, venous insufficiency, Barrett's esophagus, and GAD complicated by the grief over the loss of her daughter who was admitted with increased SOB with volume overload.  Assessment & Plan    1. Acute on chronic diastolic CHF/SOB: -Volume status improved -Hold Lasix 9/11 -Lexican Myoview as below -Repeat echo to assess PA pressure today -Consider thoracentesis given CXR with pleural effusion (will need to be off Eliquis 24-48 hours prior) on 9/12 or 9/13 -Consider RHC to better assess volume status prior to discharge (would ideally like to hold Eliquis 24-48 hours prior to procedure), if still symptomatic with the above -Daily weights, strict Is and Os  2. Persistent Afib: -She remains in  sinus rhythm -Metoprolol has been discontinued given chronotropic incompetence and wheezing noted on 9/9 -Change Eliquis to heparin gtt for possible RHC prior to discharge  3. Chest pain/abnormal Myoview: -Chest pain resolved -Troponin minimally elevated at 0.03 -Lexiscan Myoview on 9/10 abnormal as above. She is opposed with invasive evaluation given her underlying CKD and she is opposed to dialysis. Medical management advised -As outpatient, on Eliquis in place of ASA -Heparin gtt as above -Could add Imdur for refractory symptoms   4. HTN: -Controlled -Continue current medications  5. NSVT: -Improved -Replete magnesium to goal > 2.0 -Potassium at goal -Recent TSH in 01/2017 normal  6. Acute on CKD stage III-IV: -Hold Lasix -Monitor -Lexiscan Myoview as above -Hold losartan    Signed, Christell Faith, PA-C [facility] Pager: [phone] 04/18/2017, 7:17 AM

## 2017-04-18 NOTE — Progress Notes (Signed)
ANTICOAGULATION CONSULT NOTE - Initial Consult  Pharmacy Consult for Heparin  Indication: atrial fibrillation  Allergies  Allergen Reactions  . Ace Inhibitors Cough  . Benicar [Olmesartan Medoxomil]     Unsure of reaction type  . Clarithromycin Other (See Comments)    Blisters in mouth  . Clarithromycin Other (See Comments)    Unknown   . Norvasc [Amlodipine Besylate]   . Levofloxacin Rash  . Zithromax [Azithromycin Dihydrate] Rash    Patient Measurements: Height: 5' (152.4 cm) Weight: 141 lb 4.8 oz (64.1 kg) IBW/kg (Calculated) : 45.5 Heparin Dosing Weight:   Vital Signs: Temp: 97.8 F (36.6 C) (09/11 0743) Temp Source: Oral (09/11 0743) BP: 152/54 (09/11 0743) Pulse Rate: 64 (09/11 0743)  Labs:  Recent Labs  04/15/17 0940 04/15/17 1624 04/15/17 2142 04/16/17 0413 04/17/17 0411 04/18/17 0607  HGB 10.2*  --   --  10.0*  --   --   HCT 30.1*  --   --  29.9*  --   --   PLT 280  --   --  260  --   --   CREATININE 1.45*  --   --  1.44* 1.75* 1.99*  TROPONINI <0.03 0.03* 0.03* 0.03*  --   --     Estimated Creatinine Clearance: 16.9 mL/min (A) (by C-G formula based on SCr of 1.99 mg/dL (H)).   Medical History: Past Medical History:  Diagnosis Date  . Asthma   . Barrett esophagus   . Chronic bronchitis   . Chronic kidney disease (CKD), stage IV (severe) ([place])   . Depression   . Diverticulosis   . GERD (gastroesophageal reflux disease)   . Gout   . Hyperlipidemia   . Hypertension   . Hypothyroid   . Mitral regurgitation    a. TTE 02/2017: EF 55-60%, normal WM, calcified mitral annulus with mod regurg, moderately dilated LA, unable to estimate PASP  . Pancreatitis due to biliary obstruction March 2010   s/p ERCP sphincterotomy, cholecystectomy  . Peripheral neuralgia   . Persistent atrial fibrillation ([place])    a. diagnosed 01/24/17; CHADS2VASc => 5 (HTN, age x 2, vascular disease with PAD and aortic plaque, female) giving her an estimated annual stroke risk  of 6.7%; b. successful DCCV 03/28/2017; c. on Eliquis  . Venous insufficiency     Medications:  Prescriptions Prior to Admission  Medication Sig Dispense Refill Last Dose  . acetaminophen (TYLENOL) 650 MG CR tablet Take 650 mg by mouth every 8 (eight) hours as needed for pain.    prn at prn  . ALPRAZolam (XANAX) 0.5 MG tablet Take 1.5 tablets (0.75 mg total) by mouth at bedtime as needed for anxiety. (Patient taking differently: Take 0.5 mg by mouth at bedtime. ) 30 tablet 5 prn at prn  . apixaban (ELIQUIS) 2.5 MG TABS tablet Take 1 tablet (2.5 mg total) by mouth 2 (two) times daily. 180 tablet 3 04/14/2017 at Unknown time  . aspirin EC 81 MG tablet Take 81 mg by mouth every other day. IN THE EVENING   Unknown at Unknown  . Cholecalciferol (VITAMIN D-3) 1000 units CAPS Take 1,000 Units by mouth daily.    04/14/2017 at Unknown time  . DULERA 100-5 MCG/ACT AERO USE 2 PUFFS TWICE DAILY. (Patient taking differently: USE 2 PUFFS TWICE DAILY AS NEEDED FOR RESPIRATORY ISSUES.) 13 g 3 prn at prn  . fluticasone (FLONASE) 50 MCG/ACT nasal spray Place 2 sprays into the nose daily. (Patient taking differently: Place 2 sprays into  the nose daily as needed (For allergies.). ) 16 g 6 prn at prn  . furosemide (LASIX) 20 MG tablet Take 1 tablet (20 mg total) by mouth daily. 90 tablet 3 04/14/2017 at Unknown time  . hydrALAZINE (APRESOLINE) 25 MG tablet Take 1 tablet (25 mg total) by mouth 3 (three) times daily. (Patient taking differently: Take 25 mg by mouth daily. ) 30 tablet 3 04/14/2017 at Unknown time  . hydrALAZINE (APRESOLINE) 50 MG tablet Take 50 mg by mouth at bedtime.   04/14/2017 at Unknown time  . levothyroxine (SYNTHROID, LEVOTHROID) 100 MCG tablet TAKE ONE TABLET BY MOUTH EVERY DAY (Patient taking differently: TAKE ONE TABLET BY MOUTH IN THE MORNING 30 MINUTES BEFORE BREAKFAST) 90 tablet 1 04/15/2017 at Unknown time  . loperamide (IMODIUM A-D) 2 MG tablet Take 2 mg by mouth 4 (four) times daily as needed for  diarrhea or loose stools.    prn at prn  . losartan (COZAAR) 100 MG tablet TAKE ONE TABLET BY MOUTH EVERY DAY (Patient taking differently: TAKE ONE TABLET BY MOUTH EVERY DAY AT NIGHT) 90 tablet 1 04/14/2017 at Unknown time  . metoprolol tartrate (LOPRESSOR) 25 MG tablet Take 0.5 tablets (12.5 mg total) by mouth 2 (two) times daily. 180 tablet 3 04/14/2017 at Unknown time  . omeprazole (PRILOSEC) 40 MG capsule TAKE ONE CAPSULE BY MOUTH TWICE A DAY. BEFORE BREAKFAST AND SUPPER (Patient taking differently: TAKE ONE CAPSULE BY MOUTH TWICE A DAY. 30 MINS BEFORE BREAKFAST AND SUPPER) 180 capsule 1 04/14/2017 at Unknown time  . pramipexole (MIRAPEX) 0.125 MG tablet Take 1 tablet (0.125 mg total) by mouth 3 (three) times daily. (Patient taking differently: Take 0.125 mg by mouth 3 (three) times daily as needed (for restless legs). ) 30 tablet 11 Unknown time at Unknown time  . PROAIR HFA 108 (90 BASE) MCG/ACT inhaler INHALE 2 PUFFS EVERY 6 HOURS AS NEEDED FOR WHEEZING 8.5 g 3 prn at prn  . Spacer/Aero-Holding Chambers (OPTICHAMBER ADVANTAGE) MISC 1 each by Other route once. Always uses her when you're using a metered-dose inhaler. You've aromatase medicine as much, he won't have his much side effect, but you it twice as much medicine and your lungs. 1 each 0 Unknown time at Unknown time  . temazepam (RESTORIL) 15 MG capsule Take 1 capsule (15 mg total) by mouth at bedtime as needed for sleep. 30 capsule 0 04/14/2017 at Unknown time  . vitamin B-12 (CYANOCOBALAMIN) 1000 MCG tablet Take 1,000 mcg by mouth daily.   04/14/2017 at Unknown time  . amoxicillin-clavulanate (AUGMENTIN) 875-125 MG tablet Take 1 tablet by mouth 2 (two) times daily. (Patient not taking: Reported on 04/15/2017) 14 tablet 0 Completed Course at Unknown time  . Beclomethasone Dipropionate 80 MCG/ACT AERS Place 2 sprays into the nose daily. (Patient not taking: Reported on 04/15/2017) 8.7 g 11 Not Taking at prn  . nystatin (MYCOSTATIN) 100000 UNIT/ML  suspension Take 5 mLs (500,000 Units total) by mouth 4 (four) times daily. (Patient not taking: Reported on 04/15/2017) 60 mL 0 Not Taking at Unknown time  . nystatin (MYCOSTATIN/NYSTOP) powder Apply topically 4 (four) times daily. (Patient not taking: Reported on 04/15/2017) 15 g 0 Not Taking at Unknown time   Scheduled:  . ALPRAZolam  0.5 mg Oral QHS  . cholecalciferol  1,000 Units Oral Daily  . fluticasone  2 spray Each Nare Daily  . hydrALAZINE  50 mg Oral BID  . levothyroxine  100 mcg Oral Daily  . mometasone-formoterol  2 puff  Inhalation BID  . nystatin  5 mL Oral QID  . pantoprazole  40 mg Oral BID  . regadenoson  0.4 mg Intravenous Once  . sodium chloride flush  3 mL Intravenous Q12H  . vitamin B-12  1,000 mcg Oral Daily   Infusions:  . heparin      Assessment: Pharmacy consulted to dose and monitor heparin in this 83 year old female for atrial fibrillation. Patient is chronicially on apixaban (last dose @ ~2100 on 9/10)  Goal of Therapy:  Heparin level 0.3-0.7 units/ml Monitor platelets by anticoagulation protocol: Yes   Plan:  Will hold off on bolusing patient. Will start Heparin gtt @ rate of 900 units/hr. Will need to check APTT and HL until levels correlate. First APTT and HL ordered to be drawn @ 1730   Antwan Pandya D 04/18/2017,8:51 AM

## 2017-04-19 ENCOUNTER — Telehealth: Payer: Self-pay | Admitting: Cardiovascular Disease

## 2017-04-19 ENCOUNTER — Telehealth: Payer: Self-pay | Admitting: Internal Medicine

## 2017-04-19 ENCOUNTER — Other Ambulatory Visit: Payer: Self-pay | Admitting: Internal Medicine

## 2017-04-19 ENCOUNTER — Inpatient Hospital Stay: Payer: PPO

## 2017-04-19 DIAGNOSIS — N179 Acute kidney failure, unspecified: Secondary | ICD-10-CM

## 2017-04-19 DIAGNOSIS — N183 Chronic kidney disease, stage 3 (moderate): Secondary | ICD-10-CM

## 2017-04-19 LAB — BASIC METABOLIC PANEL
ANION GAP: 6 (ref 5–15)
BUN: 32 mg/dL — ABNORMAL HIGH (ref 6–20)
CO2: 24 mmol/L (ref 22–32)
CREATININE: 1.62 mg/dL — AB (ref 0.44–1.00)
Calcium: 8.8 mg/dL — ABNORMAL LOW (ref 8.9–10.3)
Chloride: 109 mmol/L (ref 101–111)
GFR calc non Af Amer: 28 mL/min — ABNORMAL LOW (ref >60)
GFR, EST AFRICAN AMERICAN: 32 mL/min — AB (ref >60)
GLUCOSE: 98 mg/dL (ref 65–99)
Potassium: 3.8 mmol/L (ref 3.5–5.1)
Sodium: 139 mmol/L (ref 135–145)

## 2017-04-19 LAB — ECHOCARDIOGRAM COMPLETE
HEIGHTINCHES: 60 in
Weight: 2260.8 oz

## 2017-04-19 LAB — APTT

## 2017-04-19 LAB — HEPARIN LEVEL (UNFRACTIONATED): HEPARIN UNFRACTIONATED: 3.04 [IU]/mL — AB (ref 0.30–0.70)

## 2017-04-19 MED ORDER — LOSARTAN POTASSIUM 50 MG PO TABS: 50.0000 mg | ORAL_TABLET | Freq: Every day | ORAL | 1 refills | Status: DC

## 2017-04-19 MED ORDER — HEPARIN (PORCINE) IN NACL 100-0.45 UNIT/ML-% IJ SOLN
800.0000 [IU]/h | INTRAMUSCULAR | Status: DC
Start: 2017-04-19 — End: 2017-04-19
  Administered 2017-04-19: 800 [IU]/h via INTRAVENOUS

## 2017-04-19 MED ORDER — APIXABAN 2.5 MG PO TABS: 2.5000 mg | ORAL_TABLET | Freq: Two times a day (BID) | ORAL | Status: DC

## 2017-04-19 MED ORDER — FUROSEMIDE 40 MG PO TABS: 40.0000 mg | ORAL_TABLET | Freq: Every day | ORAL | 1 refills | Status: DC

## 2017-04-19 MED ORDER — FUROSEMIDE 10 MG/ML IJ SOLN
40.0000 mg | Freq: Once | INTRAMUSCULAR | Status: AC
  Administered 2017-04-19: 40 mg via INTRAVENOUS
  Filled 2017-04-19: qty 4

## 2017-04-19 MED ORDER — HYDRALAZINE HCL 50 MG PO TABS: 50.0000 mg | ORAL_TABLET | Freq: Four times a day (QID) | ORAL | 1 refills | Status: DC

## 2017-04-19 NOTE — Progress Notes (Signed)
[place] for Heparin  Indication: atrial fibrillation  Allergies  Allergen Reactions  . Ace Inhibitors Cough  . Benicar [Olmesartan Medoxomil]     Unsure of reaction type  . Clarithromycin Other (See Comments)    Blisters in mouth  . Clarithromycin Other (See Comments)    Unknown   . Norvasc [Amlodipine Besylate]   . Levofloxacin Rash  . Zithromax [Azithromycin Dihydrate] Rash    Patient Measurements: Height: 5' (152.4 cm) Weight: 140 lb (63.5 kg) IBW/kg (Calculated) : 45.5 Heparin Dosing Weight:   Vital Signs: Temp: 97.6 F (36.4 C) (09/12 0357) Temp Source: Oral (09/12 0357) BP: 158/58 (09/12 0357) Pulse Rate: 69 (09/12 0357)  Labs:  Recent Labs  04/17/17 0411 04/18/17 9379 04/18/17 0951 04/18/17 1824 04/19/17 0528  HGB  --   --  9.7*  --   --   HCT  --   --  29.1*  --   --   PLT  --   --  260  --   --   APTT  --   --  73* 64* >160*  LABPROT  --   --  17.8*  --   --   INR  --   --  1.48  --   --   HEPARINUNFRC  --   --  >3.60* >3.60* 3.04*  CREATININE 1.75* 1.99*  --   --   --     Estimated Creatinine Clearance: 16.9 mL/min (A) (by C-G formula based on SCr of 1.99 mg/dL (H)).   Medical History: Past Medical History:  Diagnosis Date  . Asthma   . Barrett esophagus   . Chronic bronchitis   . Chronic kidney disease (CKD), stage IV (severe) ([place])   . Depression   . Diverticulosis   . GERD (gastroesophageal reflux disease)   . Gout   . Hyperlipidemia   . Hypertension   . Hypothyroid   . Mitral regurgitation    a. TTE 02/2017: EF 55-60%, normal WM, calcified mitral annulus with mod regurg, moderately dilated LA, unable to estimate PASP  . Pancreatitis due to biliary obstruction March 2010   s/p ERCP sphincterotomy, cholecystectomy  . Peripheral neuralgia   . Persistent atrial fibrillation ([place])    a. diagnosed 01/24/17; CHADS2VASc => 5 (HTN, age x 2, vascular disease with PAD and aortic plaque, female) giving  her an estimated annual stroke risk of 6.7%; b. successful DCCV 03/28/2017; c. on Eliquis  . Venous insufficiency     Medications:  Prescriptions Prior to Admission  Medication Sig Dispense Refill Last Dose  . acetaminophen (TYLENOL) 650 MG CR tablet Take 650 mg by mouth every 8 (eight) hours as needed for pain.    prn at prn  . ALPRAZolam (XANAX) 0.5 MG tablet Take 1.5 tablets (0.75 mg total) by mouth at bedtime as needed for anxiety. (Patient taking differently: Take 0.5 mg by mouth at bedtime. ) 30 tablet 5 prn at prn  . apixaban (ELIQUIS) 2.5 MG TABS tablet Take 1 tablet (2.5 mg total) by mouth 2 (two) times daily. 180 tablet 3 04/14/2017 at Unknown time  . aspirin EC 81 MG tablet Take 81 mg by mouth every other day. IN THE EVENING   Unknown at Unknown  . Cholecalciferol (VITAMIN D-3) 1000 units CAPS Take 1,000 Units by mouth daily.    04/14/2017 at Unknown time  . DULERA 100-5 MCG/ACT AERO USE 2 PUFFS TWICE DAILY. (Patient taking differently: USE 2 PUFFS TWICE DAILY AS  NEEDED FOR RESPIRATORY ISSUES.) 13 g 3 prn at prn  . fluticasone (FLONASE) 50 MCG/ACT nasal spray Place 2 sprays into the nose daily. (Patient taking differently: Place 2 sprays into the nose daily as needed (For allergies.). ) 16 g 6 prn at prn  . furosemide (LASIX) 20 MG tablet Take 1 tablet (20 mg total) by mouth daily. 90 tablet 3 04/14/2017 at Unknown time  . hydrALAZINE (APRESOLINE) 25 MG tablet Take 1 tablet (25 mg total) by mouth 3 (three) times daily. (Patient taking differently: Take 25 mg by mouth daily. ) 30 tablet 3 04/14/2017 at Unknown time  . hydrALAZINE (APRESOLINE) 50 MG tablet Take 50 mg by mouth at bedtime.   04/14/2017 at Unknown time  . levothyroxine (SYNTHROID, LEVOTHROID) 100 MCG tablet TAKE ONE TABLET BY MOUTH EVERY DAY (Patient taking differently: TAKE ONE TABLET BY MOUTH IN THE MORNING 30 MINUTES BEFORE BREAKFAST) 90 tablet 1 04/15/2017 at Unknown time  . loperamide (IMODIUM A-D) 2 MG tablet Take 2 mg by mouth 4  (four) times daily as needed for diarrhea or loose stools.    prn at prn  . losartan (COZAAR) 100 MG tablet TAKE ONE TABLET BY MOUTH EVERY DAY (Patient taking differently: TAKE ONE TABLET BY MOUTH EVERY DAY AT NIGHT) 90 tablet 1 04/14/2017 at Unknown time  . metoprolol tartrate (LOPRESSOR) 25 MG tablet Take 0.5 tablets (12.5 mg total) by mouth 2 (two) times daily. 180 tablet 3 04/14/2017 at Unknown time  . omeprazole (PRILOSEC) 40 MG capsule TAKE ONE CAPSULE BY MOUTH TWICE A DAY. BEFORE BREAKFAST AND SUPPER (Patient taking differently: TAKE ONE CAPSULE BY MOUTH TWICE A DAY. 30 MINS BEFORE BREAKFAST AND SUPPER) 180 capsule 1 04/14/2017 at Unknown time  . pramipexole (MIRAPEX) 0.125 MG tablet Take 1 tablet (0.125 mg total) by mouth 3 (three) times daily. (Patient taking differently: Take 0.125 mg by mouth 3 (three) times daily as needed (for restless legs). ) 30 tablet 11 Unknown time at Unknown time  . PROAIR HFA 108 (90 BASE) MCG/ACT inhaler INHALE 2 PUFFS EVERY 6 HOURS AS NEEDED FOR WHEEZING 8.5 g 3 prn at prn  . Spacer/Aero-Holding Chambers (OPTICHAMBER ADVANTAGE) MISC 1 each by Other route once. Always uses her when you're using a metered-dose inhaler. You've aromatase medicine as much, he won't have his much side effect, but you it twice as much medicine and your lungs. 1 each 0 Unknown time at Unknown time  . temazepam (RESTORIL) 15 MG capsule Take 1 capsule (15 mg total) by mouth at bedtime as needed for sleep. 30 capsule 0 04/14/2017 at Unknown time  . vitamin B-12 (CYANOCOBALAMIN) 1000 MCG tablet Take 1,000 mcg by mouth daily.   04/14/2017 at Unknown time  . amoxicillin-clavulanate (AUGMENTIN) 875-125 MG tablet Take 1 tablet by mouth 2 (two) times daily. (Patient not taking: Reported on 04/15/2017) 14 tablet 0 Completed Course at Unknown time  . Beclomethasone Dipropionate 80 MCG/ACT AERS Place 2 sprays into the nose daily. (Patient not taking: Reported on 04/15/2017) 8.7 g 11 Not Taking at prn  . nystatin  (MYCOSTATIN) 100000 UNIT/ML suspension Take 5 mLs (500,000 Units total) by mouth 4 (four) times daily. (Patient not taking: Reported on 04/15/2017) 60 mL 0 Not Taking at Unknown time  . nystatin (MYCOSTATIN/NYSTOP) powder Apply topically 4 (four) times daily. (Patient not taking: Reported on 04/15/2017) 15 g 0 Not Taking at Unknown time   Scheduled:  . ALPRAZolam  0.5 mg Oral QHS  . cholecalciferol  1,000 Units Oral  Daily  . fluticasone  2 spray Each Nare Daily  . hydrALAZINE  50 mg Oral BID  . levothyroxine  100 mcg Oral Daily  . mometasone-formoterol  2 puff Inhalation BID  . nystatin  5 mL Oral QID  . pantoprazole  40 mg Oral BID  . regadenoson  0.4 mg Intravenous Once  . sodium chloride flush  3 mL Intravenous Q12H  . vitamin B-12  1,000 mcg Oral Daily   Infusions:  . heparin      Assessment: Pharmacy consulted to dose and monitor heparin in this 83 year old female for atrial fibrillation. Patient is chronicially on apixaban (last dose @ ~2100 on 9/10). Switched to Heparin drip for possible thoracentesis.  Goal of Therapy:  APTT:66-102 Heparin level 0.3-0.7 units/ml Monitor platelets by anticoagulation protocol: Yes   Plan:  Will hold off on bolusing patient. Will start Heparin gtt @ rate of 900 units/hr. Will need to check APTT and HL until levels correlate. First APTT and HL ordered to be drawn @ 1730   9/11 1915 aPTT 64. Level subtherapeutic. Will increase heparin drip to 1000u/hr and recheck aPTT and HL in 6 hours.   9/12 aPTT@0528 = >160, Heparin level= 3.04. Will hold Heparin drip x 1 hour and restart at 800 units/hr. Will check aPTT in 6 hours. Will follow aPTTs since Heparin levels do not correlate as patient was on apixaban. Order HL once daily and when aPTT and HL correlate will then monitor using HL.  Noralee Space, PharmD, BCPS Clinical Pharmacist 04/19/2017 8:03 AM

## 2017-04-19 NOTE — Care Management (Addendum)
Informed by primary nurse that was informed that patient is going to discharge today.  CM spoke with attending.  He has spoken with cardiology and determined patient is medically stable for for discharge.  Daughter- [name]-  is at bedside.  Discussed discharge plan, home health service- RN PT and Aide, copay for home health visits and walker.  Has access to bedside commode and will have someone that can come in and perform light housekeeping, and errands.  Provided medi alert brochure.  Notified [facility] of discharge and orders for RN PT Aide.  Has not qualified for home oxygen.Patient and daughter in agreement with discharge and plan. Tammy has a new set of scales she is going to take to patient's home. Eliwquis is chronic

## 2017-04-19 NOTE — Telephone Encounter (Signed)
[facility] called needing to schedule a HFU for 1 week. Pt was in for CHF exacerbation. Please advise, thank you!  Call pt @ [phone]

## 2017-04-19 NOTE — Progress Notes (Signed)
Patient Name: Savannah Wilson Date of Encounter: 04/19/2017  Primary Cardiologist: [facility] Problem List     Active Problems:   CHF exacerbation ([place])     Subjective   SOB continues to improve. Did not ambulate on 9/11 given heparin gtt was started in place of Eliquis. No bmet this morning. No chest pain. Documented UOP of 2.3 L for the admission. Lasix has been held since 9/11 given AKI.   Inpatient Medications    Scheduled Meds: . ALPRAZolam  0.5 mg Oral QHS  . cholecalciferol  1,000 Units Oral Daily  . fluticasone  2 spray Each Nare Daily  . hydrALAZINE  50 mg Oral BID  . levothyroxine  100 mcg Oral Daily  . mometasone-formoterol  2 puff Inhalation BID  . nystatin  5 mL Oral QID  . pantoprazole  40 mg Oral BID  . regadenoson  0.4 mg Intravenous Once  . sodium chloride flush  3 mL Intravenous Q12H  . vitamin B-12  1,000 mcg Oral Daily   Continuous Infusions: . heparin     PRN Meds: acetaminophen **OR** acetaminophen, guaiFENesin-dextromethorphan, hydrALAZINE, loperamide, ondansetron **OR** ondansetron (ZOFRAN) IV, pramipexole, temazepam   Vital Signs    Vitals:   04/18/17 0743 04/18/17 1125 04/18/17 1923 04/19/17 0357  BP: (!) 152/54 (!) 152/46 (!) 157/64 (!) 158/58  Pulse: 64 62 71 69  Resp: 18 20 18 17   Temp: 97.8 F (36.6 C) 97.9 F (36.6 C) 98.1 F (36.7 C) 97.6 F (36.4 C)  TempSrc: Oral Oral Oral Oral  SpO2: 100% 99% 99% 99%  Weight:    140 lb (63.5 kg)  Height:        Intake/Output Summary (Last 24 hours) at 04/19/17 0805 Last data filed at 04/19/17 0500  Gross per 24 hour  Intake           414.45 ml  Output              500 ml  Net           -85.55 ml   Filed Weights   04/17/17 0341 04/18/17 0330 04/19/17 0357  Weight: 140 lb 6.9 oz (63.7 kg) 141 lb 4.8 oz (64.1 kg) 140 lb (63.5 kg)    Physical Exam    GEN: Well nourished, well developed, in no acute distress.  HEENT: Grossly normal.  Neck: Supple, no JVD, carotid bruits, or  masses. Cardiac: RRR, II/VI systolic murmur, no rubs, or gallops. No clubbing, cyanosis, edema.  Radials/DP/PT 2+ and equal bilaterally.  Respiratory:  Improved breath sounds down to the right base, now with crackles (improved). GI: Soft, nontender, nondistended, BS + x 4. MS: no deformity or atrophy. Skin: warm and dry, no rash. Neuro:  Strength and sensation are intact. Psych: AAOx3.  Normal affect.  Labs    CBC  Recent Labs  04/18/17 0951  WBC 6.4  HGB 9.7*  HCT 29.1*  MCV 91.2  PLT 008   Basic Metabolic Panel  Recent Labs  04/17/17 0411 04/18/17 0607  NA 140 140  K 4.3 3.8  CL 110 110  CO2 26 24  GLUCOSE 104* 109*  BUN 28* 36*  CREATININE 1.75* 1.99*  CALCIUM 8.6* 8.4*   Liver Function Tests No results for input(s): AST, ALT, ALKPHOS, BILITOT, PROT, ALBUMIN in the last 72 hours. No results for input(s): LIPASE, AMYLASE in the last 72 hours. Cardiac Enzymes No results for input(s): CKTOTAL, CKMB, CKMBINDEX, TROPONINI in the last 72 hours. BNP  Invalid input(s): POCBNP D-Dimer No results for input(s): DDIMER in the last 72 hours. Hemoglobin A1C No results for input(s): HGBA1C in the last 72 hours. Fasting Lipid Panel No results for input(s): CHOL, HDL, LDLCALC, TRIG, CHOLHDL, LDLDIRECT in the last 72 hours. Thyroid Function Tests No results for input(s): TSH, T4TOTAL, T3FREE, THYROIDAB in the last 72 hours.  Invalid input(s): FREET3  Telemetry    NSR, occasional PVCs, 6 beats NSVT - Personally Reviewed  ECG    n/a - Personally Reviewed  Radiology    Nm Myocar Multi W/spect W/wall Motion / Ef  Result Date: 04/17/2017  T wave inversion was noted during stress in the III, V3 and V4 leads.  There was no ST segment deviation noted during stress.  Defect 1: There is a medium defect of severe severity present in the basal inferior, basal inferolateral, mid inferior and mid inferolateral location.  Findings consistent with prior inferior/inferolateral  myocardial infarction with peri-infarct ischemia.  This is an intermediate risk study.  The left ventricular ejection fraction is normal (55-65%).  Severe hypotension with Lexiscan which was reversed with Aminophyllin. 500 mL of saline bolus was given with resolution of hypotension.    Dg Chest Port 1 View  Result Date: 04/17/2017 IMPRESSION: Stable small right and diminished left pleural effusions with associated bibasilar atelectasis. Aortic atherosclerosis. Electronically Signed   By: Kristine Garbe M.D.   On: 04/17/2017 22:13    Cardiac Studies   Lexiscan as above.  TTE 02/2017: Study Conclusions  - Left ventricle: The cavity size was normal. There was mild concentric hypertrophy. Systolic function was normal. The estimated ejection fraction was in the range of 55% to 60%. Wall motion was normal; there were no regional wall motion abnormalities. - Mitral valve: Calcified annulus. There was moderate regurgitation. - Left atrium: The atrium was moderately dilated. - Pulmonary arteries: Systolic pressure could not be accurately estimated.  Patient Profile     84 y.o. female with history of recently formally diagnosed Afib in 01/2017 on Eliquis (though uncertain chronicity of Afib) s/p recent successful DCCV on 03/28/2017, CKD stage IV, asthma/COPD with mild confirmed obstructive lung disease by PFT in 2012, HTN, hypothyroidism on replacement therapy, HLD, anemia of chronic disease, venous insufficiency, Barrett's esophagus, and GAD complicated by the grief over the loss of her daughter who was admitted with increased SOB with volume overload.  Assessment & Plan    1. Acute on chronic diastolic CHF/SOB: -Volume status improved -Continue to hold Lasix, pending bmet this morning -Lexican Myoview as above -Await repeat echo to assess PA pressure -Breath sounds are improved along the right base, order repeat CXR this AM -If PA pressures are elevated and renal  function continues to preclude diuresis, consider thoracentesis if repeat CXR shows continued pleural effusion(last dose of Eliquis was 9/10 PM) -Consider RHC to better assess volume status prior to discharge if symptoms persist (would need to be off Eliquis 24-48 hours prior to procedure, last dose as above) -Daily weights, strict Is and Os  2. Persistent Afib: -She remains in sinus rhythm -Metoprolol has been discontinued given chronotropic incompetence and wheezing noted on 9/9 -On heparin gtt in place of Eliquis (heparin gtt on hold this AM given labs) -At discharge, change back to Eliquis with dosing being dependent on her renal function at that time  3. Chest pain/abnormal Myoview: -Chest pain resolved -Troponin minimally elevated at 0.03 -Lexiscan Myoview on 9/10 abnormal as above. She is opposed with invasive evaluation given her underlying CKD  and she is opposed to dialysis. Medical management advised -As outpatient, on Eliquis in place of ASA -Heparin gtt as above -Could add Imdur for refractory symptoms   4. HTN: -Modestly controlled -Continue current medications  5. NSVT: -Improved -Replete magnesium to goal > 2.0 -Potassium at goal -Recent TSH in 01/2017 normal  6. Acute on CKD stage III-IV: -Hold Lasix -Monitor -Lexiscan Myoview as above -Hold losartan   Signed, Christell Faith, PA-C [place] Pager: [phone] 04/19/2017, 8:05 AM    Attending Note Patient seen and examined, agree with detailed note above,  Patient presentation and plan discussed on rounds.   Shortness of breath this morning, still on nasal cannula oxygen Has not ambulated with PT per the patient Was very short of breath prior to hospital admission Events from yesterday reviewed with her, drop in blood pressure during stress test, received IV fluids  Stress test showing fixed defect inferior wall  X-ray reviewed with her from this morning, slight worsening of right pleural  effusion Echocardiogram also reviewed with her showing low normal ejection fraction, inferior wall hypokinesis, moderately elevated right heart pressures 50+  On physical exam unable to estimate JVP, lungs with crackles at the bases, dullness at the right base in particular, heart sounds regular with no murmurs appreciated, abdomen soft nontender, no significant lower extremity edema  --- Acute on chronic diastolic CHF As above slight worsening of pleural effusion on the right, shortness of breath this morning Elevated pressures greater than 50 on echo Lasix 40 mg IV 1 ordered We will try to wean oxygen to the course of the day If still weak, short of breath, may need to give additional Lasix either tonight or tomorrow morning Suspect she is a brittle diabetic given underlying renal dysfunction We'll need to monitor renal function closely  --- Chest pain Stress test with fixed defect consistent with previous MI, no large region of ischemia No further intervention needed Okay to hold aspirin, continue eliquis  --- Atrial fibrillation Maintaining normal sinus rhythm, we'll continue eliquis Hold heparin  Case discussed with hospitalist service, case manager, nursing Greater than 50% was spent in counseling and coordination of care with patient Total encounter time 35 minutes or more   Signed: Esmond Plants  M.D., Ph.D. [facility]

## 2017-04-19 NOTE — Discharge Summary (Signed)
[facility] NAME: Savannah Wilson    MR#:  [id number]  DATE OF BIRTH:  Jul 22, 1929  DATE OF ADMISSION:  04/15/2017 ADMITTING PHYSICIAN: Gladstone Lighter, MD  DATE OF DISCHARGE: 04/19/2017  PRIMARY CARE PHYSICIAN: [name], MD    ADMISSION DIAGNOSIS:  Acute congestive heart failure, unspecified heart failure type ([place]) [I50.9]  DISCHARGE DIAGNOSIS:  Active Problems:   CHF exacerbation ([place])   SECONDARY DIAGNOSIS:   Past Medical History:  Diagnosis Date  . Asthma   . Barrett esophagus   . Chronic bronchitis   . Chronic kidney disease (CKD), stage IV (severe) ([place])   . Depression   . Diverticulosis   . GERD (gastroesophageal reflux disease)   . Gout   . Hyperlipidemia   . Hypertension   . Hypothyroid   . Mitral regurgitation    a. TTE 02/2017: EF 55-60%, normal WM, calcified mitral annulus with mod regurg, moderately dilated LA, unable to estimate PASP  . Pancreatitis due to biliary obstruction March 2010   s/p ERCP sphincterotomy, cholecystectomy  . Peripheral neuralgia   . Persistent atrial fibrillation ([place])    a. diagnosed 01/24/17; CHADS2VASc => 5 (HTN, age x 2, vascular disease with PAD and aortic plaque, female) giving her an estimated annual stroke risk of 6.7%; b. successful DCCV 03/28/2017; c. on Eliquis  . Venous insufficiency     HOSPITAL COURSE:   83 year old female with history of hypertension, hyperlipidemia, hypothyroidism, depression, chronic kidney disease stage IV, history of diastolic CHF who presented to the hospital with chest pain and shortness of breath and noted to be in congestive heart failure.  1. CHF-acute on chronic diastolic dysfunction. - patient was treated with IV diuresis intermittently while in the hospital and responded to it well. Her shortness of breath and hypoxia has improved.he is a total of 3 L negative since admission. - Cardiology was consulted and patient had a repeat  two-dimensional echhocardiogram done which showed elevated PA pressures with grade 2 diastolic dysfunction.  Pt. Has clinically improved and not hypoxic upon ambulation with diuresis and therefore will be discharged home.  - pt's Lasix dose has been increased from 20 mg to 40 mg Daily and his BP regimen has been adjusted as mentioned below.   2. Chest Pain - due to CHF. CE X 3 have been (-).  - patient had a nuclear medicine stress test on 04/17/17 which was intermediate risk without any overt evidence of ischemia. Patient did have a findings of previous inferior/inferolateral infarction. - currently pt. Is clinically asymptomatic.   - pt dropped her BP during the stress test but it responded well to fluids and she is presently hemodynamically stable.    3. Hx of Chronic a. Fib - pt. Remains rate controlled and is in NSR.  - pt. Will cont. Her Eliquis.   4. Essential HTN - pt. BP regimen has been adjusted.  Pt. Will now be on Hydralazine 50 mg PO QID, Losartan 50 mg PO Daily.  Her Metoprolol has currently been discontinued for now.  - further adjustments to her regimen can be done as outpatient by Cardiology.   5. Hypothyroidism - pt. Will cont. Her Synthroid.   6. COPD - no acute exacerbation.  - pt. Will cont. Her Dulera, Albuterol inhaler as needed.   7. CKD Stage III - pt. Cr. Remains close to baseline with diuresis can be further followed as outpatient.   DISCHARGE CONDITIONS:   Stable.  CONSULTS OBTAINED:  Treatment Team:  Nelva Bush, MD  DRUG ALLERGIES:   Allergies  Allergen Reactions  . Ace Inhibitors Cough  . Benicar [Olmesartan Medoxomil]     Unsure of reaction type  . Clarithromycin Other (See Comments)    Blisters in mouth  . Clarithromycin Other (See Comments)    Unknown   . Norvasc [Amlodipine Besylate]   . Levofloxacin Rash  . Zithromax [Azithromycin Dihydrate] Rash    DISCHARGE MEDICATIONS:   Allergies as of 04/19/2017      Reactions   Ace  Inhibitors Cough   Benicar [olmesartan Medoxomil]    Unsure of reaction type   Clarithromycin Other (See Comments)   Blisters in mouth   Clarithromycin Other (See Comments)   Unknown    Norvasc [amlodipine Besylate]    Levofloxacin Rash   Zithromax [azithromycin Dihydrate] Rash      Medication List    STOP taking these medications   amoxicillin-clavulanate 875-125 MG tablet Commonly known as:  AUGMENTIN   Beclomethasone Dipropionate 80 MCG/ACT Aers   metoprolol tartrate 25 MG tablet Commonly known as:  LOPRESSOR   nystatin powder Commonly known as:  MYCOSTATIN/NYSTOP     TAKE these medications   acetaminophen 650 MG CR tablet Commonly known as:  TYLENOL Take 650 mg by mouth every 8 (eight) hours as needed for pain.   ALPRAZolam 0.5 MG tablet Commonly known as:  XANAX Take 1.5 tablets (0.75 mg total) by mouth at bedtime as needed for anxiety. What changed:  how much to take  when to take this   apixaban 2.5 MG Tabs tablet Commonly known as:  ELIQUIS Take 1 tablet (2.5 mg total) by mouth 2 (two) times daily.   aspirin EC 81 MG tablet Take 81 mg by mouth every other day. IN THE EVENING   DULERA 100-5 MCG/ACT Aero Generic drug:  mometasone-formoterol USE 2 PUFFS TWICE DAILY. What changed:  See the new instructions.   fluticasone 50 MCG/ACT nasal spray Commonly known as:  FLONASE Place 2 sprays into the nose daily. What changed:  when to take this  reasons to take this   furosemide 40 MG tablet Commonly known as:  LASIX Take 1 tablet (40 mg total) by mouth daily. What changed:  medication strength  how much to take   hydrALAZINE 50 MG tablet Commonly known as:  APRESOLINE Take 1 tablet (50 mg total) by mouth 4 (four) times daily. What changed:  medication strength  how much to take  when to take this  Another medication with the same name was removed. Continue taking this medication, and follow the directions you see here.   IMODIUM A-D 2  MG tablet Generic drug:  loperamide Take 2 mg by mouth 4 (four) times daily as needed for diarrhea or loose stools.   levothyroxine 100 MCG tablet Commonly known as:  SYNTHROID, LEVOTHROID TAKE ONE TABLET BY MOUTH EVERY DAY What changed:  See the new instructions.   losartan 50 MG tablet Commonly known as:  COZAAR Take 1 tablet (50 mg total) by mouth daily. What changed:  medication strength  See the new instructions.   nystatin 100000 UNIT/ML suspension Commonly known as:  MYCOSTATIN Take 5 mLs (500,000 Units total) by mouth 4 (four) times daily.   omeprazole 40 MG capsule Commonly known as:  PRILOSEC TAKE ONE CAPSULE BY MOUTH TWICE A DAY. BEFORE BREAKFAST AND SUPPER What changed:  See the new instructions.   OPTICHAMBER ADVANTAGE Misc 1 each by Other route once.  Always uses her when you're using a metered-dose inhaler. You've aromatase medicine as much, he won't have his much side effect, but you it twice as much medicine and your lungs.   pramipexole 0.125 MG tablet Commonly known as:  MIRAPEX Take 1 tablet (0.125 mg total) by mouth 3 (three) times daily. What changed:  when to take this  reasons to take this   PROAIR HFA 108 (90 Base) MCG/ACT inhaler Generic drug:  albuterol INHALE 2 PUFFS EVERY 6 HOURS AS NEEDED FOR WHEEZING   temazepam 15 MG capsule Commonly known as:  RESTORIL Take 1 capsule (15 mg total) by mouth at bedtime as needed for sleep.   vitamin B-12 1000 MCG tablet Commonly known as:  CYANOCOBALAMIN Take 1,000 mcg by mouth daily.   Vitamin D-3 1000 units Caps Take 1,000 Units by mouth daily.            Durable Medical Equipment        Start     Ordered   04/19/17 1440  For home use only DME Walker rolling  Once    Question:  Patient needs a walker to treat with the following condition  Answer:  Weakness   04/19/17 1439       Discharge Care Instructions        Start     Ordered   04/19/17 0000  furosemide (LASIX) 40 MG tablet   Daily     04/19/17 1449   04/19/17 0000  hydrALAZINE (APRESOLINE) 50 MG tablet  4 times daily     04/19/17 1449   04/19/17 0000  losartan (COZAAR) 50 MG tablet  Daily     04/19/17 1449   04/19/17 0000  Activity as tolerated - No restrictions     04/19/17 1449   04/19/17 0000  Diet - low sodium heart healthy     04/19/17 1449        DISCHARGE INSTRUCTIONS:   DIET:  Cardiac diet  DISCHARGE CONDITION:  Stable  ACTIVITY:  Activity as tolerated  OXYGEN:  Home Oxygen: No.   Oxygen Delivery: room air  DISCHARGE LOCATION:   home with home health nursing, physical therapy, home health aide   If you experience worsening of your admission symptoms, develop shortness of breath, life threatening emergency, suicidal or homicidal thoughts you must seek medical attention immediately by calling 911 or calling your MD immediately  if symptoms less severe.  You Must read complete instructions/literature along with all the possible adverse reactions/side effects for all the Medicines you take and that have been prescribed to you. Take any new Medicines after you have completely understood and accpet all the possible adverse reactions/side effects.   Please note  You were cared for by a hospitalist during your hospital stay. If you have any questions about your discharge medications or the care you received while you were in the hospital after you are discharged, you can call the unit and asked to speak with the hospitalist on call if the hospitalist that took care of you is not available. Once you are discharged, your primary care physician will handle any further medical issues. Please note that NO REFILLS for any discharge medications will be authorized once you are discharged, as it is imperative that you return to your primary care physician (or establish a relationship with a primary care physician if you do not have one) for your aftercare needs so that they can reassess your need for  medications and monitor your lab values.  Today   Shortness of breath improved.  No other acute events overnight.  Will d/c home with Home health today.   VITAL SIGNS:  Blood pressure (!) 126/53, pulse 71, temperature 98 F (36.7 C), temperature source Oral, resp. rate (!) 98, height 5' (1.524 m), weight 63.5 kg (140 lb), SpO2 96 %.  I/O:   Intake/Output Summary (Last 24 hours) at 04/19/17 1527 Last data filed at 04/19/17 1300  Gross per 24 hour  Intake           534.45 ml  Output             1351 ml  Net          -816.55 ml    PHYSICAL EXAMINATION:   GENERAL:  83 y.o.-year-old patient lying in bed in no acute distress.  EYES: Pupils equal, round, reactive to light and accommodation. No scleral icterus. Extraocular muscles intact.  HEENT: Head atraumatic, normocephalic. Oropharynx and nasopharynx clear.  NECK:  Supple, no jugular venous distention. No thyroid enlargement, no tenderness.  LUNGS: Normal breath sounds bilaterally, no wheezing, rales, No rhonchi. No use of accessory muscles of respiration.  CARDIOVASCULAR: S1, S2 normal. No murmurs, rubs, or gallops.  ABDOMEN: Soft, nontender, nondistended. Bowel sounds present. No organomegaly or mass.  EXTREMITIES: No cyanosis, clubbing , Trace edema b/l    NEUROLOGIC: Cranial nerves II through XII are intact. No focal Motor or sensory deficits b/l.   PSYCHIATRIC: The patient is alert and oriented x 3.  SKIN: No obvious rash, lesion, or ulcer.   DATA REVIEW:   CBC  Recent Labs Lab 04/18/17 0951  WBC 6.4  HGB 9.7*  HCT 29.1*  PLT 260    Chemistries   Recent Labs Lab 04/16/17 0413  04/19/17 0528  NA 141  < > 139  K 3.6  < > 3.8  CL 111  < > 109  CO2 24  < > 24  GLUCOSE 104*  < > 98  BUN 28*  < > 32*  CREATININE 1.44*  < > 1.62*  CALCIUM 8.8*  < > 8.8*  MG 1.8  --   --   < > = values in this interval not displayed.  Cardiac Enzymes  Recent Labs Lab 04/16/17 0413  TROPONINI 0.03*    RADIOLOGY:   Dg Chest Port 1 View  Result Date: 04/19/2017 CLINICAL DATA:  Dyspnea EXAM: PORTABLE CHEST 1 VIEW COMPARISON:  04/17/2017 chest radiograph. FINDINGS: Stable cardiomediastinal silhouette with top-normal heart size, aortic atherosclerosis and moderate hiatal hernia. No pneumothorax. Small right pleural effusion is slightly increased. Stable trace left pleural effusion. No pulmonary edema. Patchy right greater than left lung base opacities, slightly increased. IMPRESSION: 1. Small right pleural effusion, slightly increased. Stable trace left pleural effusion. 2. Patchy right greater than left lung base opacities, slightly increased, favor atelectasis. 3. Moderate hiatal hernia. Electronically Signed   By: Ilona Sorrel M.D.   On: 04/19/2017 08:41   Dg Chest Port 1 View  Result Date: 04/17/2017 CLINICAL DATA:  83 y/o  F; cough. EXAM: PORTABLE CHEST 1 VIEW COMPARISON:  04/15/2017 chest radiograph FINDINGS: Stable normal cardiac silhouette given projection and technique. Aortic atherosclerosis with calcification. Stable small right pleural effusion. Diminished left pleural effusion. Associated bibasilar atelectasis. No new focal consolidation. All all degenerative changes of the right glenohumeral joint and of the thoracic spine. IMPRESSION: Stable small right and diminished left pleural effusions with associated bibasilar atelectasis. Aortic atherosclerosis. Electronically Signed   By: Kristine Garbe  M.D.   On: 04/17/2017 22:13      Management plans discussed with the patient, family and they are in agreement.  CODE STATUS:     Code Status Orders        Start     Ordered   04/15/17 1526  Full code  Continuous     04/15/17 1525    Code Status History    Date Active Date Inactive Code Status Order ID Comments User Context   This patient has a current code status but no historical code status.    Advance Directive Documentation     Most Recent Value  Type of Advance Directive   Healthcare Power of Attorney, Living will  Pre-existing out of facility DNR order (yellow form or pink MOST form)  -  "MOST" Form in Place?  -      TOTAL TIME TAKING CARE OF THIS PATIENT: 40 minutes.    [name] M.D on 04/19/2017 at 3:27 PM  Between 7am to 6pm - Pager - [phone]  After 6pm go to [email or web address] - Proofreader  [employer]  [facility]  Office  [phone]  CC: Primary care physician; [name], MD

## 2017-04-19 NOTE — Progress Notes (Signed)
Physical Therapy Treatment Patient Details Name: Savannah Wilson MRN: [id number] DOB: Feb 10, 1929 Today's Date: 04/19/2017    History of Present Illness Savannah Wilson is a 83 y.o. female with a known history of asthma and chronic bronchitis, hypertension, GERD, persistent atrial fibrillation status post cardioversion on 03/28/2017, peripheral neuropathy, CKD stage IV, hypothyroidism presents to the hospital secondary to worsening shortness of breath. Patient has had dyspnea for several months now,but worsened in the last week. She was noted to have persistent atrial fibrillation and was successfully cardioverted on 03/28/17. With her dyspnea, she was diagnosed with pneumonia and started on Augmentin. Her Lasix was changed from 40 mg every other day to 20 mg daily about 10 days ago. Patient denies any increased salt intake, no recent travel. She does notice that she is having occasional chest pain and worsening dyspnea especially with minimal exertion. Her orthopnea is at baseline. Denies any cough. Also complains of generalized weakness. No fevers or chills. She is unable to care for herself with her worsening dyspnea and chest pain and so presented to emergency room. BNP is elevated, troponin is negative. She is currently admitted for acute on chronic dyspnea secondary to acute CHF exacerbation.    PT Comments    Savannah Wilson is making good progress with mobility and pt was encouraged to ambulate with nursing staff at lest 3x/day.  Pt ambulated 100 ft with RW and 1 standing rest break due to fatigue after ambulating 90 ft.  SpO2 remains at or above 96% on RA throughout entire session.  Pt will benefit from continued skilled PT services to increase functional independence and safety.   Follow Up Recommendations  Home health PT     Equipment Recommendations  Rolling walker with 5" wheels;Other (comment) (Pt has 3 wheeled walker but needs a front-wheeled walker)    Recommendations for Other Services        Precautions / Restrictions Precautions Precautions: Fall;Other (comment) Precaution Comments: Monitor O2 Restrictions Weight Bearing Restrictions: No    Mobility  Bed Mobility Overal bed mobility: Modified Independent Bed Mobility: Supine to Sit;Sit to Supine     Supine to sit: Modified independent (Device/Increase time) Sit to supine: Modified independent (Device/Increase time)   General bed mobility comments: Minimal increased time and effort.  No physical assist or cues needed.  Transfers Overall transfer level: Needs assistance Equipment used: Rolling walker (2 wheeled) Transfers: Sit to/from Stand Sit to Stand: Supervision         General transfer comment: Supervision for safety.  Cues for proper hand placement and safe technique using RW.  Ambulation/Gait Ambulation/Gait assistance: Min guard Ambulation Distance (Feet): 100 Feet Assistive device: Rolling walker (2 wheeled) Gait Pattern/deviations: Decreased step length - right;Decreased step length - left Gait velocity: Decreased  Gait velocity interpretation: Below normal speed for age/gender General Gait Details: Pt with slow but steady gait.  SpO2 remains at or above 96% on RA.  1 standing rest break due to fatigue after ambulating 90 ft.    Stairs            Wheelchair Mobility    Modified Rankin (Stroke Patients Only)       Balance Overall balance assessment: Needs assistance Sitting-balance support: No upper extremity supported Sitting balance-Leahy Scale: Good     Standing balance support: No upper extremity supported;During functional activity Standing balance-Leahy Scale: Fair Standing balance comment: Pt able to stand statically without UE support but relies on UE support for dynamic activities  Cognition Arousal/Alertness: Awake/alert Behavior During Therapy: WFL for tasks assessed/performed Overall Cognitive Status: Within Functional  Limits for tasks assessed                                        Exercises Other Exercises Other Exercises: Encouraged pt to ambulate at least 3x/day with nursing staff Other Exercises: Encourage pt to attempt sitting in upright position as much as possible throughout the day for improved pulmonary function. Other Exercises: Instructed pt to have family member place chairs in most rooms of the house to allow pt a place for a seated rest break when needed.    General Comments General comments (skin integrity, edema, etc.): SpO2 remains at or above 96% on RA throughout entire session.      Pertinent Vitals/Pain Pain Assessment: No/denies pain    Home Living                      Prior Function            PT Goals (current goals can now be found in the care plan section) Acute Rehab PT Goals Patient Stated Goal: Return to her prior functional level PT Goal Formulation: With patient/family Time For Goal Achievement: 04/30/17 Potential to Achieve Goals: Good Progress towards PT goals: Progressing toward goals    Frequency    Min 2X/week      PT Plan Current plan remains appropriate    Co-evaluation              AM-PAC PT "6 Clicks" Daily Activity  Outcome Measure  Difficulty turning over in bed (including adjusting bedclothes, sheets and blankets)?: A Little Difficulty moving from lying on back to sitting on the side of the bed? : A Little Difficulty sitting down on and standing up from a chair with arms (e.g., wheelchair, bedside commode, etc,.)?: A Little Help needed moving to and from a bed to chair (including a wheelchair)?: A Little Help needed walking in hospital room?: A Little Help needed climbing 3-5 steps with a railing? : A Little 6 Click Score: 18    End of Session Equipment Utilized During Treatment: Gait belt Activity Tolerance: Patient tolerated treatment well Patient left: in bed;with call bell/phone within reach;with  bed alarm set;Other (comment);with nursing/sitter in room (in chair position) Nurse Communication: Mobility status;Other (comment) (SpO2) PT Visit Diagnosis: Unsteadiness on feet (R26.81);Muscle weakness (generalized) (M62.81)     Time: 5027-7412 PT Time Calculation (min) (ACUTE ONLY): 15 min  Charges:  $Gait Training: 8-22 mins                    G Codes:       Collie Siad PT, DPT 04/19/2017, 11:37 AM

## 2017-04-19 NOTE — Progress Notes (Signed)
Pt and daughter instructed on discharge instructions and medications and verbalized understanding, prescription given directly to patient, IV catheter discontinued, tip intact, telemetry monitor removed, walker delivered to pt, pt left via wheelchair in stable condition with no s/s of distress or discomfort. Harlene Ramus, RN

## 2017-04-19 NOTE — Telephone Encounter (Signed)
Currently admitted.

## 2017-04-19 NOTE — Telephone Encounter (Signed)
TCM.... Pt was seen in hospital by Dr End  Is being discharged today by hospital They are coming on 04/28/17 to see Dr Fletcher Anon

## 2017-04-19 NOTE — Care Management (Signed)
Patient agrees at present to "consider" home health. After speaking with patient's daughter [name], informed that patient will discharge back to her own home and the home of her friend but will have some inhome caregivers to assist with personal care "and such." No agency preference for home health and [facility] is not able to accept for nursing at present time so heads up referral to [facility] due to agency's expertise in heart failure.  A home oxygen assessment has not been documented.  Discussed with attending, cardiology and primary nurse.  Patient most likely will need a front wheeled rolling walker

## 2017-04-20 NOTE — Telephone Encounter (Signed)
Transition Care Management Follow-up Telephone Call  How have you been since you were released from the hospital? Feeling better but very tired per patient.   Do you understand why you were in the hospital? Yes   Do you understand the discharge instrcutions? Yes  Items Reviewed:  Medications reviewed: Yes medication list up to date.  Allergies reviewed: Yes  Dietary changes reviewed: Yes  Referrals reviewed: Yes   Functional Questionnaire:   Activities of Daily Living (ADLs):   She states they are independent in the following: In dressing and preparing lite meals. Needs assist with bathing. States they require assistance with the following: bathing, house hold chores.  Any transportation issues/concerns?: no  Any patient concerns? No   Confirmed importance and date/time of follow-up visits scheduled: yes   Confirmed with patient if condition begins to worsen call PCP or go to the ER.  Patient was given the Call-a-Nurse line [phone]: Yes
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
## 2017-04-20 NOTE — Telephone Encounter (Signed)
Make sure she is weighing herself daily in the morning AFTER urination but before eating.  If her weight increases by 2 lbs overnight ,  She needs to call her heart doctor or me.

## 2017-04-20 NOTE — Telephone Encounter (Signed)
Pt's daughter [name] requested a call Pt contact [phone]

## 2017-04-20 NOTE — Telephone Encounter (Signed)
Patient contacted regarding discharge from [facility] on 04/19/17.  Patient understands to follow up with provider Dr. Fletcher Anon on 04/28/17 at 4:20pm at [facility]. Patient understands discharge instructions? yes Patient understands medications and regiment? yes Patient understands to bring all medications to this visit? yes  She has home health and an aide to help w/ADLs and transportation.

## 2017-04-20 NOTE — Telephone Encounter (Signed)
Patient is weighing and cardiology advised 3 pounds per patient I have advised her your orders are 2 popunds

## 2017-04-20 NOTE — Telephone Encounter (Signed)
Refill authorized and faxed to [facility]

## 2017-04-20 NOTE — Telephone Encounter (Signed)
Last OV 01/24/2017 Next OV not scheduled Last refill 02/23/2017

## 2017-04-20 NOTE — Telephone Encounter (Signed)
Talked with patient daughter and advised patient daughter when she is to weigh herself is first thing in the morning.

## 2017-04-24 ENCOUNTER — Encounter: Payer: Self-pay | Admitting: Cardiovascular Disease

## 2017-04-24 ENCOUNTER — Ambulatory Visit (INDEPENDENT_AMBULATORY_CARE_PROVIDER_SITE_OTHER): Payer: PPO | Admitting: Cardiovascular Disease

## 2017-04-24 ENCOUNTER — Other Ambulatory Visit: Payer: Self-pay | Admitting: Internal Medicine

## 2017-04-24 VITALS — BP 138/54 | HR 70 | Ht 60.0 in | Wt 144.5 lb

## 2017-04-24 DIAGNOSIS — I2510 Atherosclerotic heart disease of native coronary artery without angina pectoris: Secondary | ICD-10-CM | POA: Diagnosis not present

## 2017-04-24 DIAGNOSIS — I481 Persistent atrial fibrillation: Secondary | ICD-10-CM

## 2017-04-24 DIAGNOSIS — I10 Essential (primary) hypertension: Secondary | ICD-10-CM

## 2017-04-24 DIAGNOSIS — I5032 Chronic diastolic (congestive) heart failure: Secondary | ICD-10-CM | POA: Diagnosis not present

## 2017-04-24 DIAGNOSIS — I4819 Other persistent atrial fibrillation: Secondary | ICD-10-CM

## 2017-04-24 MED ORDER — METOPROLOL TARTRATE 25 MG PO TABS: 12.5000 mg | ORAL_TABLET | Freq: Two times a day (BID) | ORAL | 3 refills | Status: DC

## 2017-04-24 NOTE — Patient Instructions (Signed)
Medication Instructions:  Your physician has recommended you make the following change in your medication:  1- Resume Metoprolol 12.5 mg by mouth two times a day.   Labwork: none  Testing/Procedures: none  Follow-Up: Your physician recommends that you schedule a follow-up appointment in: [place].   If you need a refill on your cardiac medications before your next appointment, please call your pharmacy.

## 2017-04-24 NOTE — Progress Notes (Signed)
Cardiology Office Note   Date:  04/24/2017   ID:  Savannah Wilson, DOB 07-24-1929, MRN [id number]  PCP:  [name], MD  Cardiologist:   Kathlyn Sacramento, MD   Chief Complaint  Patient presents with  . other    Follow up from [facility]; CHF. Meds reviewed by the pt. verbally. Pt. c/o LE edema.       History of Present Illness: Savannah Wilson is a 83 y.o. female who presents for A follow-up visit regarding atrial fibrillation And chronic diastolic heart failure. She has known history of stage IV chronic kidney disease, asthma/COPD, essential hypertension, hypothyroidism, anemia of chronic disease and Barrett's esophagus. She underwent successful cardioversion to sinus rhythm on August 21. She presented to [facility] on September 8 with worsening shortness of breath. She was found to be volume overloaded with severe hypertension on presentation. She slowly improved with diuresis which was somewhat difficult due to underlying chronic kidney disease. She had a repeat echocardiogram done which showed normal LV systolic function with inferior wall hypokinesis. There was moderate mitral regurgitation and moderate pulmonary hypertension with peak systolic pressure of 52 mmHg. She underwent a pharmacologic nuclear stress test with Lexiscan which showed evidence of prior inferior/inferolateral infarct with minimal peri-infarct ischemia. The patient had severe hypotension with Lexiscan. She has been doing reasonably well since hospital discharge with gradual improvement in shortness of breath and leg edema. Her blood pressure and pulse have been in the normal range.  Past Medical History:  Diagnosis Date  . Asthma   . Barrett esophagus   . Chronic bronchitis   . Chronic kidney disease (CKD), stage IV (severe) ([place])   . Depression   . Diverticulosis   . GERD (gastroesophageal reflux disease)   . Gout   . Hyperlipidemia   . Hypertension   . Hypothyroid   . Mitral regurgitation    a. TTE 02/2017: EF  55-60%, normal WM, calcified mitral annulus with mod regurg, moderately dilated LA, unable to estimate PASP  . Pancreatitis due to biliary obstruction March 2010   s/p ERCP sphincterotomy, cholecystectomy  . Peripheral neuralgia   . Persistent atrial fibrillation ([place])    a. diagnosed 01/24/17; CHADS2VASc => 5 (HTN, age x 2, vascular disease with PAD and aortic plaque, female) giving her an estimated annual stroke risk of 6.7%; b. successful DCCV 03/28/2017; c. on Eliquis  . Venous insufficiency     Past Surgical History:  Procedure Laterality Date  . ABDOMINAL HYSTERECTOMY    . CARDIOVERSION N/A 03/28/2017   Procedure: CARDIOVERSION;  Surgeon: Nelva Bush, MD;  Location: [facility];  Service: Cardiovascular;  Laterality: N/A;  . CARPAL TUNNEL RELEASE  jan 2013   [name]  . CATARACT EXTRACTION     right  . CATARACT EXTRACTION  2008  . CHOLECYSTECTOMY  02/10  . CHOLECYSTECTOMY  2010  . TONSILLECTOMY    . VENTRAL HERNIA REPAIR    . VENTRAL HERNIA REPAIR  2007  . VESICOVAGINAL FISTULA CLOSURE W/ TAH       Current Outpatient Prescriptions  Medication Sig Dispense Refill  . acetaminophen (TYLENOL) 650 MG CR tablet Take 650 mg by mouth every 8 (eight) hours as needed for pain.     Marland Kitchen ALPRAZolam (XANAX) 0.5 MG tablet TAKE ONE AND ONE-HALF TABLETS AT BEDTIMEAS NEEDED FOR ANXIETY 30 tablet 1  . apixaban (ELIQUIS) 2.5 MG TABS tablet Take 1 tablet (2.5 mg total) by mouth 2 (two) times daily. 180 tablet 3  .  aspirin EC 81 MG tablet Take 81 mg by mouth every other day. IN THE EVENING    . Cholecalciferol (VITAMIN D-3) 1000 units CAPS Take 1,000 Units by mouth daily.     . DULERA 100-5 MCG/ACT AERO USE 2 PUFFS TWICE DAILY. (Patient taking differently: USE 2 PUFFS TWICE DAILY AS NEEDED FOR RESPIRATORY ISSUES.) 13 g 3  . fluticasone (FLONASE) 50 MCG/ACT nasal spray Place 2 sprays into the nose daily. (Patient taking differently: Place 2 sprays into the nose daily as needed (For allergies.). )  16 g 6  . furosemide (LASIX) 40 MG tablet Take 1 tablet (40 mg total) by mouth daily. 60 tablet 1  . hydrALAZINE (APRESOLINE) 50 MG tablet Take 1 tablet (50 mg total) by mouth 4 (four) times daily. 120 tablet 1  . levothyroxine (SYNTHROID, LEVOTHROID) 100 MCG tablet TAKE ONE TABLET BY MOUTH EVERY DAY (Patient taking differently: TAKE ONE TABLET BY MOUTH IN THE MORNING 30 MINUTES BEFORE BREAKFAST) 90 tablet 1  . loperamide (IMODIUM A-D) 2 MG tablet Take 2 mg by mouth 4 (four) times daily as needed for diarrhea or loose stools.     . losartan (COZAAR) 50 MG tablet Take 1 tablet (50 mg total) by mouth daily. 30 tablet 1  . nystatin (MYCOSTATIN) 100000 UNIT/ML suspension Take 5 mLs (500,000 Units total) by mouth 4 (four) times daily. 60 mL 0  . omeprazole (PRILOSEC) 40 MG capsule TAKE ONE CAPSULE BY MOUTH TWICE A DAY. BEFORE BREAKFAST AND SUPPER (Patient taking differently: TAKE ONE CAPSULE BY MOUTH TWICE A DAY. 30 MINS BEFORE BREAKFAST AND SUPPER) 180 capsule 1  . pramipexole (MIRAPEX) 0.125 MG tablet Take 1 tablet (0.125 mg total) by mouth 3 (three) times daily. (Patient taking differently: Take 0.125 mg by mouth 3 (three) times daily as needed (for restless legs). ) 30 tablet 11  . PROAIR HFA 108 (90 BASE) MCG/ACT inhaler INHALE 2 PUFFS EVERY 6 HOURS AS NEEDED FOR WHEEZING 8.5 g 3  . Spacer/Aero-Holding Chambers (OPTICHAMBER ADVANTAGE) MISC 1 each by Other route once. Always uses her when you're using a metered-dose inhaler. You've aromatase medicine as much, he won't have his much side effect, but you it twice as much medicine and your lungs. 1 each 0  . temazepam (RESTORIL) 15 MG capsule TAKE 1 CAPSULE AT BEDTIME AS NEEDED FOR SLEEP 30 capsule 2  . vitamin B-12 (CYANOCOBALAMIN) 1000 MCG tablet Take 1,000 mcg by mouth daily.     No current facility-administered medications for this visit.     Allergies:   Ace inhibitors; Benicar [olmesartan medoxomil]; Clarithromycin; Clarithromycin; Norvasc  [amlodipine besylate]; Levofloxacin; and Zithromax [azithromycin dihydrate]    Social History:  The patient  reports that she has never smoked. She has never used smokeless tobacco. She reports that she does not drink alcohol or use drugs.   Family History:  The patient's family history includes Cancer in her daughter; Heart disease in her father; Hypertension in her mother; Kidney cancer in her father; Kidney disease in her daughter and mother; Kidney failure in her mother; Multiple myeloma in her daughter; Rheumatologic disease in her father.    ROS:  Please see the history of present illness.   Otherwise, review of systems are positive for none.   All other systems are reviewed and negative.    PHYSICAL EXAM: VS:  BP (!) 138/54 (Patient Position: Sitting, Cuff Size: Normal)   Pulse 70   Ht 5' (1.524 m)   Wt 144 lb 8 oz (65.5   kg)   BMI 28.22 kg/m  , BMI Body mass index is 28.22 kg/m. GEN: Well nourished, well developed, in no acute distress  HEENT: normal  Neck: no JVD, carotid bruits, or masses Cardiac: Irregularly irregular; no murmurs, rubs, or gallops,no edema  Respiratory:  clear to auscultation bilaterally, normal work of breathing GI: soft, nontender, nondistended, + BS MS: no deformity or atrophy  Skin: warm and dry, no rash Neuro:  Strength and sensation are intact Psych: euthymic mood, full affect   EKG:  EKG is ordered today. The ekg ordered today demonstrates  sinus rhythm with a PVC.   Recent Labs: 01/26/2017: ALT 9; TSH 2.310 04/15/2017: B Natriuretic Peptide 1,460.0 04/16/2017: Magnesium 1.8 04/18/2017: Hemoglobin 9.7; Platelets 260 04/19/2017: BUN 32; Creatinine, Ser 1.62; Potassium 3.8; Sodium 139    Lipid Panel    Component Value Date/Time   CHOL 124 06/16/2016 0945   TRIG 87.0 06/16/2016 0945   HDL 42.50 06/16/2016 0945   CHOLHDL 3 06/16/2016 0945   VLDL 17.4 06/16/2016 0945   LDLCALC 64 06/16/2016 0945      Wt Readings from Last 3 Encounters:    04/24/17 144 lb 8 oz (65.5 kg)  04/19/17 140 lb (63.5 kg)  04/04/17 150 lb (68 kg)      PAD Screen 01/26/2017  Previous PAD dx? No  Previous surgical procedure? No  Pain with walking? No  Feet/toe relief with dangling? No  Painful, non-healing ulcers? No  Extremities discolored? No      ASSESSMENT AND PLAN:  1.  Persistent atrial fibrillation:  Status post successful cardioversion to sinus rhythm. She is maintaining in sinus rhythm but she is at high risk for recurrent atrial fibrillation. I elected to resume metoprolol at a smaller dose of 12.5 mg twice daily. This was discontinued in the hospital due to wheezing which I think was likely due to heart failure. Continue anticoagulation with low dose Eliquis given age and chronic kidney disease. If she develops recurrent atrial fibrillation, she might require an antiarrhythmic medication such as amiodarone.  2. Chronic diastolic heart failure with moderate pulmonary hypertension: This improved gradually with diuresis. She appears to be euvolemic on current dose of furosemide 40 mg once daily.  3. Coronary artery disease involving native coronary arteries with other forms of angina: Recent stress test showed evidence of prior inferior and inferolateral infarct with minimal ischemia. Given her age and advanced chronic kidney disease, we elected for medical therapy.  4. Essential hypertension: Blood pressure is controlled.  5. Chronic kidney disease: Stable and followed by nephrology.   Disposition:   FU with me in 2 months  Signed,  Kathlyn Sacramento, MD  04/24/2017 4:01 PM    [facility]

## 2017-04-25 ENCOUNTER — Other Ambulatory Visit: Payer: Self-pay | Admitting: *Deleted

## 2017-04-25 ENCOUNTER — Ambulatory Visit: Payer: PPO | Admitting: Family

## 2017-04-25 NOTE — Patient Outreach (Signed)
  [facility]) Care Management  04/25/2017  LELANIA BIA Dec 04, 1928 [id number]  EMMI- General Discharge RED ON EMMI ALERT DAY#: 4 DATE: 04/24/17 RED ALERT: Taking meds? No  Outreach attempt #1 to patient. No answer. RN CM left HIPAA compliant message along with contact info.    Plan: RN CM will contact patient within one week.   Lake Bells, RN, BSN, MHA/MSL, [place] Telephonic Care Manager Coordinator [facility] Direct Phone: [phone] Toll Free: [phone] Fax: [phone]

## 2017-04-26 ENCOUNTER — Other Ambulatory Visit: Payer: Self-pay | Admitting: *Deleted

## 2017-04-26 ENCOUNTER — Ambulatory Visit: Payer: Self-pay | Admitting: *Deleted

## 2017-04-26 DIAGNOSIS — H6123 Impacted cerumen, bilateral: Secondary | ICD-10-CM | POA: Diagnosis not present

## 2017-04-26 DIAGNOSIS — H903 Sensorineural hearing loss, bilateral: Secondary | ICD-10-CM | POA: Diagnosis not present

## 2017-04-26 NOTE — Patient Outreach (Signed)
[facility]) Care Management  04/26/2017  Savannah Wilson [birth year] [id number]  Savannah Wilson 09-Sep-1928 [id number]  EMMI- General Discharge RED ON EMMI ALERT DAY#: 4 DATE: 04/24/17 RED ALERT: Taking meds? No  Outreach attempt # 2  to patient. No answer. RN CM left HIPAA compliant message along with contact info.   Plan: RN CM will contact patient within one week.   Lake Bells, RN, BSN, MHA/MSL, [place] Telephonic Care Manager Coordinator [facility] Direct Phone: [phone] Toll Free: [phone] Fax: [phone]

## 2017-04-27 ENCOUNTER — Ambulatory Visit: Payer: Self-pay | Admitting: *Deleted

## 2017-04-28 ENCOUNTER — Other Ambulatory Visit: Payer: Self-pay | Admitting: *Deleted

## 2017-04-28 ENCOUNTER — Encounter: Payer: Self-pay | Admitting: *Deleted

## 2017-04-28 ENCOUNTER — Ambulatory Visit: Payer: PPO | Admitting: Cardiovascular Disease

## 2017-04-28 NOTE — Patient Outreach (Addendum)
[facility]) Care Management  04/27/17   Savannah Wilson 1929/01/06 [id number]   EMMI- General Discharge RED ON EMMI ALERT DAY#: 4 DATE: 04/24/17 RED ALERT: Taking meds? No  Incoming call from patient and HIPAA verified with patient. Patient reported, she is taking her medications as prescribed by the MD, including Lasix. The automated phone call did not record her answer correctly. Per patient, the nurse informed her to skip the blood pressure medicine (Hydralazine), if her blood pressure was 110 or less. She was having symptoms of dizziness, which prompted her to call the nurse. Patient takes Hydralazine four times per day, which she monitors daily, per patient. Patient stated, she checks her weigh daily and her last recorded weight was 143 pounds. She stated, 143 pounds is consistent with her normal weight. Patient acknowledged knowing when to call the appropriate doctor for any changes in her medical conditions. Patient stated, she received her hospital discharge instructions. Patient stated, her daughter read and understood the hospital discharge paperwork. Patient stated, during her inpatient hospital stay, she was diagnosed with Atrial Fibrillation, which she believes contributed to her heart failure. Patient had a cardioversion during her admission. She converted to sinus rhythm. Her last cardiology visit on 04/24/17, showed patient remains in sinus rhythm with PVC, per EMR. Patient reported, she has "kidney problems" A nephrologist is monitoring her kidney function, per patient. Patient stated, "She was told by her Nephrologist to drink plenty of fluids to flush her kidneys". RN CM informed patient to speak with her Cardiologist regarding any limits on her fluid intake. Patient's next primary MD visit is 05/03/17. [facility] benefits and services explained to patient. Patient refused [facility] services at this time. She agreed to receive information about [facility] services. She plans to reach out to  [facility] for any needed services in the future.   Plan: RN CM will notify [facility] CM administrative assistant regarding case closure.  RN CM advised patient to contact RNCM for any needs or concerns. RN CM advised patient to alert MD for any changes in conditions.  RM CM will send successful outreach letter to patient. RN CM will send EMMI educational materials to patient.   Lake Bells, RN, BSN, MHA/MSL, [place] Telephonic Care Manager Coordinator [facility] Direct Phone: [phone] Toll Free: [phone] Fax: [phone]

## 2017-05-03 ENCOUNTER — Ambulatory Visit (INDEPENDENT_AMBULATORY_CARE_PROVIDER_SITE_OTHER): Payer: PPO | Admitting: Internal Medicine

## 2017-05-03 ENCOUNTER — Encounter: Payer: Self-pay | Admitting: Internal Medicine

## 2017-05-03 DIAGNOSIS — Z23 Encounter for immunization: Secondary | ICD-10-CM

## 2017-05-03 DIAGNOSIS — F4321 Adjustment disorder with depressed mood: Secondary | ICD-10-CM | POA: Diagnosis not present

## 2017-05-03 DIAGNOSIS — I10 Essential (primary) hypertension: Secondary | ICD-10-CM | POA: Diagnosis not present

## 2017-05-03 DIAGNOSIS — I5033 Acute on chronic diastolic (congestive) heart failure: Secondary | ICD-10-CM

## 2017-05-03 DIAGNOSIS — G2581 Restless legs syndrome: Secondary | ICD-10-CM | POA: Diagnosis not present

## 2017-05-03 DIAGNOSIS — N184 Chronic kidney disease, stage 4 (severe): Secondary | ICD-10-CM | POA: Diagnosis not present

## 2017-05-03 DIAGNOSIS — Z09 Encounter for follow-up examination after completed treatment for conditions other than malignant neoplasm: Secondary | ICD-10-CM

## 2017-05-03 DIAGNOSIS — F4381 Prolonged grief disorder: Secondary | ICD-10-CM

## 2017-05-03 MED ORDER — TEMAZEPAM 15 MG PO CAPS: ORAL_CAPSULE | ORAL | 2 refills | Status: DC

## 2017-05-03 NOTE — Progress Notes (Signed)
Subjective:  Patient ID: Savannah Wilson, female    DOB: 04/07/1929  Age: 83 y.o. MRN: [id number]  CC: Diagnoses of Encounter for immunization, Essential hypertension, Acute on chronic diastolic congestive heart failure ([place]), Grief reaction with prolonged bereavement, Restless legs syndrome (RLS), CKD (chronic kidney disease), stage IV ([place]), and Hospital discharge follow-up were pertinent to this visit.  HPI Savannah Wilson presents for HOSPITAL FOLLOW UP . Patient was admitted to [facility] on Sept 8 with CHF  Secondary to diastolic dysfunction and pulmonary mitral regugitation , . Patient had been having symptoms of productive cough and orthopnea that began several weeks prior to admission as dyspnea with exertion.  She had been taking lasix less than daily due to excessive urination and was not weighing herself daily .   She was admitted with acute respiratory failure secondary to CHF exacerbation and diuresed over 3 L .  Her lasix dose was increased to 40 mg daily , hydralazine 50 mg qid,  Metoprolol was dc'd and losartan continued.  Labs, imaging studies, reviewed with patient and daughter Savannah Wilson  No change in cr , at baseline ECHO reviewed from 04/18/17: EF 55 to 60%,  Mitral regurgitation, pulmonary hypertension Myoview suggestive of previous infarct involving the inferior/inferolateral wall.  Had a  hypotensive drop during test. Responded to IV fluids.    She was discharged on Sept 12 on an increased dose of lasix, hydralaizne qid,  Losartan  and no metoprolol. Bps have been 782 to 956 systolic and she has been light headed at times.   New onset constipation discussed.    The first several days home were difficult, but she says the last several days have been much better.  some restless legs symptoms at night for the last several days  Weighing herself daily.  Back on 25 metoprolol per [place].    BP lower today    Not sleeping well.  Wide awake at 2:30 am despite taking melatonin and  restoril.  Still grieving the loss of her beloved dog who had been her companion for over 13 years.      Outpatient Medications Prior to Visit  Medication Sig Dispense Refill  . acetaminophen (TYLENOL) 650 MG CR tablet Take 650 mg by mouth every 8 (eight) hours as needed for pain.     Marland Kitchen ALPRAZolam (XANAX) 0.5 MG tablet TAKE ONE AND ONE-HALF TABLETS AT BEDTIMEAS NEEDED FOR ANXIETY 30 tablet 1  . apixaban (ELIQUIS) 2.5 MG TABS tablet Take 1 tablet (2.5 mg total) by mouth 2 (two) times daily. 180 tablet 3  . aspirin EC 81 MG tablet Take 81 mg by mouth every other day. IN THE EVENING    . Cholecalciferol (VITAMIN D-3) 1000 units CAPS Take 1,000 Units by mouth daily.     . DULERA 100-5 MCG/ACT AERO USE 2 PUFFS TWICE DAILY. (Patient taking differently: USE 2 PUFFS TWICE DAILY AS NEEDED FOR RESPIRATORY ISSUES.) 13 g 3  . fluticasone (FLONASE) 50 MCG/ACT nasal spray Place 2 sprays into the nose daily. (Patient taking differently: Place 2 sprays into the nose daily as needed (For allergies.). ) 16 g 6  . furosemide (LASIX) 40 MG tablet Take 1 tablet (40 mg total) by mouth daily. 60 tablet 1  . hydrALAZINE (APRESOLINE) 50 MG tablet Take 1 tablet (50 mg total) by mouth 4 (four) times daily. 120 tablet 1  . levothyroxine (SYNTHROID, LEVOTHROID) 100 MCG tablet TAKE ONE TABLET BY MOUTH EVERY DAY (Patient taking differently: TAKE ONE TABLET BY  MOUTH IN THE MORNING 30 MINUTES BEFORE BREAKFAST) 90 tablet 1  . loperamide (IMODIUM A-D) 2 MG tablet Take 2 mg by mouth 4 (four) times daily as needed for diarrhea or loose stools.     Marland Kitchen losartan (COZAAR) 50 MG tablet Take 1 tablet (50 mg total) by mouth daily. 30 tablet 1  . metoprolol tartrate (LOPRESSOR) 25 MG tablet Take 0.5 tablets (12.5 mg total) by mouth 2 (two) times daily. 180 tablet 3  . nystatin (MYCOSTATIN) 100000 UNIT/ML suspension Take 5 mLs (500,000 Units total) by mouth 4 (four) times daily. 60 mL 0  . omeprazole (PRILOSEC) 40 MG capsule TAKE ONE CAPSULE  BY MOUTH TWICE A DAY. BEFORE BREAKFAST AND SUPPER (Patient taking differently: TAKE ONE CAPSULE BY MOUTH TWICE A DAY. 30 MINS BEFORE BREAKFAST AND SUPPER) 180 capsule 1  . pramipexole (MIRAPEX) 0.125 MG tablet Take 1 tablet (0.125 mg total) by mouth 3 (three) times daily. (Patient taking differently: Take 0.125 mg by mouth 3 (three) times daily as needed (for restless legs). ) 30 tablet 11  . PROAIR HFA 108 (90 BASE) MCG/ACT inhaler INHALE 2 PUFFS EVERY 6 HOURS AS NEEDED FOR WHEEZING 8.5 g 3  . Spacer/Aero-Holding Chambers (OPTICHAMBER ADVANTAGE) MISC 1 each by Other route once. Always uses her when you're using a metered-dose inhaler. You've aromatase medicine as much, he won't have his much side effect, but you it twice as much medicine and your lungs. 1 each 0  . vitamin B-12 (CYANOCOBALAMIN) 1000 MCG tablet Take 1,000 mcg by mouth daily.    . temazepam (RESTORIL) 15 MG capsule TAKE 1 CAPSULE AT BEDTIME AS NEEDED FOR SLEEP 30 capsule 2   No facility-administered medications prior to visit.     Review of Systems;  Patient denies headache, fevers, malaise, unintentional weight loss, skin rash, eye pain, sinus congestion and sinus pain, sore throat, dysphagia,  hemoptysis , cough, dyspnea, wheezing, chest pain, palpitations, orthopnea, edema, abdominal pain, nausea, melena, diarrhea, constipation, flank pain, dysuria, hematuria, urinary  Frequency, nocturia, numbness, tingling, seizures,  Focal weakness, Loss of consciousness,  Tremor, insomnia, depression, anxiety, and suicidal ideation.      Objective:  BP (!) 112/50 (BP Location: Left Arm, Patient Position: Sitting, Cuff Size: Normal)   Pulse (!) 53   Temp 97.6 F (36.4 C) (Oral)   Resp 16   Ht 5' (1.524 m)   Wt 145 lb 6.4 oz (66 kg)   SpO2 97%   BMI 28.40 kg/m   BP Readings from Last 3 Encounters:  05/03/17 (!) 112/50  04/24/17 (!) 138/54  04/19/17 (!) 126/53    Wt Readings from Last 3 Encounters:  05/03/17 145 lb 6.4 oz (66  kg)  04/24/17 144 lb 8 oz (65.5 kg)  04/19/17 140 lb (63.5 kg)    General appearance: alert, cooperative and appears stated age Ears: normal TM's and external ear canals both ears Throat: lips, mucosa, and tongue normal; teeth and gums normal Neck: no adenopathy, no carotid bruit, supple, symmetrical, trachea midline and thyroid not enlarged, symmetric, no tenderness/mass/nodules Back: symmetric, no curvature. ROM normal. No CVA tenderness. Lungs: clear to auscultation bilaterally Heart: regular rate and rhythm, S1, S2 normal, no murmur, click, rub or gallop Abdomen: soft, non-tender; bowel sounds normal; no masses,  no organomegaly Pulses: 2+ and symmetric Skin: Skin color, texture, turgor normal. No rashes or lesions Lymph nodes: Cervical, supraclavicular, and axillary nodes normal.  Lab Results  Component Value Date   HGBA1C 5.0 06/16/2016  Lab Results  Component Value Date   CREATININE 1.62 (H) 04/19/2017   CREATININE 1.99 (H) 04/18/2017   CREATININE 1.75 (H) 04/17/2017    Lab Results  Component Value Date   WBC 6.4 04/18/2017   HGB 9.7 (L) 04/18/2017   HCT 29.1 (L) 04/18/2017   PLT 260 04/18/2017   GLUCOSE 98 04/19/2017   CHOL 124 06/16/2016   TRIG 87.0 06/16/2016   HDL 42.50 06/16/2016   LDLCALC 64 06/16/2016   ALT 9 01/26/2017   AST 16 01/26/2017   NA 139 04/19/2017   K 3.8 04/19/2017   CL 109 04/19/2017   CREATININE 1.62 (H) 04/19/2017   BUN 32 (H) 04/19/2017   CO2 24 04/19/2017   TSH 2.310 01/26/2017   INR 1.48 04/18/2017   HGBA1C 5.0 06/16/2016    Dg Chest Port 1 View  Result Date: 04/19/2017 CLINICAL DATA:  Dyspnea EXAM: PORTABLE CHEST 1 VIEW COMPARISON:  04/17/2017 chest radiograph. FINDINGS: Stable cardiomediastinal silhouette with top-normal heart size, aortic atherosclerosis and moderate hiatal hernia. No pneumothorax. Small right pleural effusion is slightly increased. Stable trace left pleural effusion. No pulmonary edema. Patchy right greater  than left lung base opacities, slightly increased. IMPRESSION: 1. Small right pleural effusion, slightly increased. Stable trace left pleural effusion. 2. Patchy right greater than left lung base opacities, slightly increased, favor atelectasis. 3. Moderate hiatal hernia. Electronically Signed   By: Ilona Sorrel M.D.   On: 04/19/2017 08:41    Assessment & Plan:   Problem List Items Addressed This Visit    CHF exacerbation ([place])    With acute respiratory failure Secondary to pulmonary edema , some may have been residual from cardioversion .  EF normal by in house ECHO,  Diastolic dysfunction and  pulmonary hypertension noted.  Continue to weigh daily   Lasix 40 mg daily ,call for wt gain of 2 lbs overnight      CKD (chronic kidney disease), stage IV ([facility])    gfr is stable post diuresis       Grief reaction with prolonged bereavement    Aggravated by loss of dog.  Resume restoril for insomnia.       Hospital discharge follow-up    Patient is stable post discharge and has no new issues or questions about discharge plans at the visit today for hospital follow up. All labs , imaging studies and progress notes from admission were reviewed with patient today        Hypertension    Regimen confirmed.  Patient advised to suspend hydralazine dose for recurrent hypotension       Restless legs syndrome (RLS)    Managed with mirapex tid        Other Visit Diagnoses    Encounter for immunization       Relevant Orders   Flu vaccine HIGH DOSE PF (Completed)      I am having Ms. Helvie maintain her loperamide, acetaminophen, fluticasone, pramipexole, PROAIR HFA, OPTICHAMBER ADVANTAGE, DULERA, Vitamin D-3, apixaban, omeprazole, levothyroxine, aspirin EC, vitamin B-12, nystatin, ALPRAZolam, furosemide, hydrALAZINE, losartan, metoprolol tartrate, and temazepam.  Meds ordered this encounter  Medications  . temazepam (RESTORIL) 15 MG capsule    Sig: TAKE 1 CAPSULE AT BEDTIME AS NEEDED FOR SLEEP     Dispense:  30 capsule    Refill:  2    Medications Discontinued During This Encounter  Medication Reason  . temazepam (RESTORIL) 15 MG capsule Reorder    Follow-up: No Follow-up on file.   Deborra Medina  L, MD

## 2017-05-03 NOTE — Telephone Encounter (Signed)
Error

## 2017-05-03 NOTE — Patient Instructions (Addendum)
You are doing well  !   Reduce the hydralazine  First   The three times daily and 25 mg dose  For low blood pressure     You can use any combination of these remedies for constipation:   Colace (stool softener,  Docusate) 100 mg capsules ok to use up to 3 daily  And   Prunes or prune juice  And miralax, or metamucil,  Or citrucel (BFL's)   For the insomnia:   WE refilled the restoril on Sept 17th  This is a capsule.  Try resuming the restoril at bedtime instead of the xanax. ,  You can take 1/2 of xanax if you wake up at 2am or so if you cannot return to sleep after reading the good book.

## 2017-05-03 NOTE — Telephone Encounter (Signed)
See order(s).

## 2017-05-06 DIAGNOSIS — Z09 Encounter for follow-up examination after completed treatment for conditions other than malignant neoplasm: Secondary | ICD-10-CM | POA: Insufficient documentation

## 2017-05-06 NOTE — Assessment & Plan Note (Signed)
With acute respiratory failure Secondary to pulmonary edema , some may have been residual from cardioversion .  EF normal by in house ECHO,  Diastolic dysfunction and  pulmonary hypertension noted.  Continue to weigh daily   Lasix 40 mg daily ,call for wt gain of 2 lbs overnight

## 2017-05-06 NOTE — Assessment & Plan Note (Signed)
Managed with mirapex tid

## 2017-05-06 NOTE — Assessment & Plan Note (Signed)
gfr is stable post diuresis

## 2017-05-06 NOTE — Assessment & Plan Note (Addendum)
Aggravated by loss of dog.  Resume restoril for insomnia.

## 2017-05-06 NOTE — Assessment & Plan Note (Signed)
Patient is stable post discharge and has no new issues or questions about discharge plans at the visit today for hospital follow up. All labs , imaging studies and progress notes from admission were reviewed with patient today   

## 2017-05-06 NOTE — Assessment & Plan Note (Signed)
Regimen confirmed.  Patient advised to suspend hydralazine dose for recurrent hypotension

## 2017-05-08 DIAGNOSIS — I129 Hypertensive chronic kidney disease with stage 1 through stage 4 chronic kidney disease, or unspecified chronic kidney disease: Secondary | ICD-10-CM | POA: Diagnosis not present

## 2017-05-08 DIAGNOSIS — N2581 Secondary hyperparathyroidism of renal origin: Secondary | ICD-10-CM | POA: Diagnosis not present

## 2017-05-08 DIAGNOSIS — R809 Proteinuria, unspecified: Secondary | ICD-10-CM | POA: Diagnosis not present

## 2017-05-08 DIAGNOSIS — D631 Anemia in chronic kidney disease: Secondary | ICD-10-CM | POA: Diagnosis not present

## 2017-05-08 DIAGNOSIS — N184 Chronic kidney disease, stage 4 (severe): Secondary | ICD-10-CM | POA: Diagnosis not present

## 2017-05-18 DIAGNOSIS — M2041 Other hammer toe(s) (acquired), right foot: Secondary | ICD-10-CM | POA: Diagnosis not present

## 2017-05-18 DIAGNOSIS — M79675 Pain in left toe(s): Secondary | ICD-10-CM | POA: Diagnosis not present

## 2017-05-18 DIAGNOSIS — M79674 Pain in right toe(s): Secondary | ICD-10-CM | POA: Diagnosis not present

## 2017-05-18 DIAGNOSIS — B351 Tinea unguium: Secondary | ICD-10-CM | POA: Diagnosis not present

## 2017-05-18 DIAGNOSIS — M2042 Other hammer toe(s) (acquired), left foot: Secondary | ICD-10-CM | POA: Diagnosis not present

## 2017-06-27 ENCOUNTER — Encounter: Payer: Self-pay | Admitting: Cardiovascular Disease

## 2017-06-27 ENCOUNTER — Ambulatory Visit: Payer: PPO | Admitting: Cardiovascular Disease

## 2017-06-27 VITALS — BP 114/40 | HR 59 | Ht 59.0 in | Wt 144.0 lb

## 2017-06-27 DIAGNOSIS — I10 Essential (primary) hypertension: Secondary | ICD-10-CM

## 2017-06-27 DIAGNOSIS — I5032 Chronic diastolic (congestive) heart failure: Secondary | ICD-10-CM

## 2017-06-27 DIAGNOSIS — I25118 Atherosclerotic heart disease of native coronary artery with other forms of angina pectoris: Secondary | ICD-10-CM

## 2017-06-27 DIAGNOSIS — I481 Persistent atrial fibrillation: Secondary | ICD-10-CM | POA: Diagnosis not present

## 2017-06-27 DIAGNOSIS — I4819 Other persistent atrial fibrillation: Secondary | ICD-10-CM

## 2017-06-27 MED ORDER — HYDRALAZINE HCL 50 MG PO TABS: 50.0000 mg | ORAL_TABLET | Freq: Three times a day (TID) | ORAL | 5 refills | Status: DC

## 2017-06-27 NOTE — Patient Instructions (Addendum)
Medication Instructions:  Your physician has recommended you make the following change in your medication:  DECREASE hydralazine to 50mg  THREE TIMES A DAY   Labwork: none  Testing/Procedures: none  Follow-Up: Your physician recommends that you schedule a follow-up appointment in: 3 months with Dr. Fletcher Anon.    Any Other Special Instructions Will Be Listed Below (If Applicable).     If you need a refill on your cardiac medications before your next appointment, please call your pharmacy.  Hydralazine tablets What is this medicine? HYDRALAZINE (hye DRAL a zeen) is a type of vasodilator. It relaxes blood vessels, increasing the blood and oxygen supply to your heart. This medicine is used to treat high blood pressure. This medicine may be used for other purposes; ask your health care provider or pharmacist if you have questions. COMMON BRAND NAME(S): Apresoline What should I tell my health care provider before I take this medicine? They need to know if you have any of these conditions: -blood vessel disease -heart disease including angina or history of heart attack -kidney or liver disease -systemic lupus erythematosus (SLE) -an unusual or allergic reaction to hydralazine, tartrazine dye, other medicines, foods, dyes, or preservatives -pregnant or trying to get pregnant -breast-feeding How should I use this medicine? Take this medicine by mouth with a glass of water. Follow the directions on the prescription label. Take your doses at regular intervals. Do not take your medicine more often than directed. Do not stop taking except on the advice of your doctor or health care professional. Talk to your pediatrician regarding the use of this medicine in children. Special care may be needed. While this drug may be prescribed for children for selected conditions, precautions do apply. Overdosage: If you think you have taken too much of this medicine contact a poison control center or  emergency room at once. NOTE: This medicine is only for you. Do not share this medicine with others. What if I miss a dose? If you miss a dose, take it as soon as you can. If it is almost time for your next dose, take only that dose. Do not take double or extra doses. What may interact with this medicine? -medicines for high blood pressure -medicines for mental depression This list may not describe all possible interactions. Give your health care provider a list of all the medicines, herbs, non-prescription drugs, or dietary supplements you use. Also tell them if you smoke, drink alcohol, or use illegal drugs. Some items may interact with your medicine. What should I watch for while using this medicine? Visit your doctor or health care professional for regular checks on your progress. Check your blood pressure and pulse rate regularly. Ask your doctor or health care professional what your blood pressure and pulse rate should be and when you should contact him or her. You may get drowsy or dizzy. Do not drive, use machinery, or do anything that needs mental alertness until you know how this medicine affects you. Do not stand or sit up quickly, especially if you are an older patient. This reduces the risk of dizzy or fainting spells. Alcohol may interfere with the effect of this medicine. Avoid alcoholic drinks. Do not treat yourself for coughs, colds, or pain while you are taking this medicine without asking your doctor or health care professional for advice. Some ingredients may increase your blood pressure. What side effects may I notice from receiving this medicine? Side effects that you should report to your doctor or health care professional  as soon as possible: -chest pain, or fast or irregular heartbeat -fever, chills, or sore throat -numbness or tingling in the hands or feet -shortness of breath -skin rash, redness, blisters or itching -stiff or swollen joints -sudden weight  gain -swelling of the feet or legs -swollen lymph glands -unusual weakness Side effects that usually do not require medical attention (report to your doctor or health care professional if they continue or are bothersome): -diarrhea, or constipation -headache -loss of appetite -nausea, vomiting This list may not describe all possible side effects. Call your doctor for medical advice about side effects. You may report side effects to FDA at 1-800-FDA-1088. Where should I keep my medicine? Keep out of the reach of children. Store at room temperature between 15 and 30 degrees C (59 and 86 degrees F). Throw away any unused medicine after the expiration date. NOTE: This sheet is a summary. It may not cover all possible information. If you have questions about this medicine, talk to your doctor, pharmacist, or health care provider.  2018 Elsevier/Gold Standard (2007-12-07 15:44:58)

## 2017-06-27 NOTE — Progress Notes (Signed)
  Cardiology Office Note   Date:  06/27/2017   ID:  Savannah Wilson, DOB 03/26/1929, MRN [id number]  PCP:  [name], Teresa L, MD  Cardiologist:   Muhammad Arida, MD   Chief Complaint  Patient presents with  . other    2 month follow up. Meds reviewed by the pt. verbally. "doing well."       History of Present Illness: Savannah Wilson is a 83 y.o. female who presents for a follow-up visit regarding atrial fibrillation and chronic diastolic heart failure. She has known history of stage IV chronic kidney disease, asthma/COPD, essential hypertension, hypothyroidism, anemia of chronic disease and Barrett's esophagus. She underwent successful cardioversion to sinus rhythm on August 21. She presented to [facility] on September 8 with worsening shortness of breath. She was found to be volume overloaded with severe hypertension on presentation. She slowly improved with diuresis which was somewhat difficult due to underlying chronic kidney disease. She had a repeat echocardiogram done which showed normal LV systolic function with inferior wall hypokinesis. There was moderate mitral regurgitation and moderate pulmonary hypertension with peak systolic pressure of 52 mmHg. She underwent a pharmacologic nuclear stress test with Lexiscan which showed evidence of prior inferior/inferolateral infarct with minimal peri-infarct ischemia. The patient had severe hypotension with Lexiscan.  She has been doing well overall with rare episodes of chest pain.  Dyspnea has gradually improved.  No significant leg edema.  She complains of dizziness she has noted that her blood pressure has been on the low side.  Past Medical History:  Diagnosis Date  . Asthma   . Barrett esophagus   . Chronic bronchitis   . Chronic kidney disease (CKD), stage IV (severe) (HCC)   . Depression   . Diverticulosis   . GERD (gastroesophageal reflux disease)   . Gout   . Hyperlipidemia   . Hypertension   . Hypothyroid   . Mitral  regurgitation    a. TTE 02/2017: EF 55-60%, normal WM, calcified mitral annulus with mod regurg, moderately dilated LA, unable to estimate PASP  . Pancreatitis due to biliary obstruction March 2010   s/p ERCP sphincterotomy, cholecystectomy  . Peripheral neuralgia   . Persistent atrial fibrillation (HCC)    a. diagnosed 01/24/17; CHADS2VASc => 5 (HTN, age x 2, vascular disease with PAD and aortic plaque, female) giving her an estimated annual stroke risk of 6.7%; b. successful DCCV 03/28/2017; c. on Eliquis  . Venous insufficiency     Past Surgical History:  Procedure Laterality Date  . ABDOMINAL HYSTERECTOMY    . CARDIOVERSION N/A 03/28/2017   Procedure: CARDIOVERSION;  Surgeon: End, Christopher, MD;  Location: [facility] ORS;  Service: Cardiovascular;  Laterality: N/A;  . CARPAL TUNNEL RELEASE  jan 2013   Chris Smith  . CATARACT EXTRACTION     right  . CATARACT EXTRACTION  2008  . CHOLECYSTECTOMY  02/10  . CHOLECYSTECTOMY  2010  . TONSILLECTOMY    . VENTRAL HERNIA REPAIR    . VENTRAL HERNIA REPAIR  2007  . VESICOVAGINAL FISTULA CLOSURE W/ TAH       Current Outpatient Medications  Medication Sig Dispense Refill  . acetaminophen (TYLENOL) 650 MG CR tablet Take 650 mg by mouth every 8 (eight) hours as needed for pain.     . ALPRAZolam (XANAX) 0.5 MG tablet TAKE ONE AND ONE-HALF TABLETS AT BEDTIMEAS NEEDED FOR ANXIETY 30 tablet 1  . apixaban (ELIQUIS) 2.5 MG TABS tablet Take 1 tablet (2.5 mg total) by mouth 2 (  two) times daily. 180 tablet 3  . Cholecalciferol (VITAMIN D-3) 1000 units CAPS Take 1,000 Units by mouth daily.     . DULERA 100-5 MCG/ACT AERO USE 2 PUFFS TWICE DAILY. (Patient taking differently: USE 2 PUFFS TWICE DAILY AS NEEDED FOR RESPIRATORY ISSUES.) 13 g 3  . fluticasone (FLONASE) 50 MCG/ACT nasal spray Place 2 sprays into the nose daily. (Patient taking differently: Place 2 sprays into the nose daily as needed (For allergies.). ) 16 g 6  . furosemide (LASIX) 40 MG tablet Take  1 tablet (40 mg total) by mouth daily. 60 tablet 1  . hydrALAZINE (APRESOLINE) 50 MG tablet Take 1 tablet (50 mg total) by mouth 4 (four) times daily. 120 tablet 1  . loperamide (IMODIUM A-D) 2 MG tablet Take 2 mg by mouth 4 (four) times daily as needed for diarrhea or loose stools.     . losartan (COZAAR) 50 MG tablet Take 1 tablet (50 mg total) by mouth daily. 30 tablet 1  . metoprolol tartrate (LOPRESSOR) 25 MG tablet Take 0.5 tablets (12.5 mg total) by mouth 2 (two) times daily. 180 tablet 3  . nystatin (MYCOSTATIN) 100000 UNIT/ML suspension Take 5 mLs (500,000 Units total) by mouth 4 (four) times daily. 60 mL 0  . omeprazole (PRILOSEC) 40 MG capsule TAKE ONE CAPSULE BY MOUTH TWICE A DAY. BEFORE BREAKFAST AND SUPPER (Patient taking differently: TAKE ONE CAPSULE BY MOUTH TWICE A DAY. 30 MINS BEFORE BREAKFAST AND SUPPER) 180 capsule 1  . PROAIR HFA 108 (90 BASE) MCG/ACT inhaler INHALE 2 PUFFS EVERY 6 HOURS AS NEEDED FOR WHEEZING 8.5 g 3  . Spacer/Aero-Holding Chambers (OPTICHAMBER ADVANTAGE) MISC 1 each by Other route once. Always uses her when you're using a metered-dose inhaler. You've aromatase medicine as much, he won't have his much side effect, but you it twice as much medicine and your lungs. 1 each 0  . temazepam (RESTORIL) 15 MG capsule TAKE 1 CAPSULE AT BEDTIME AS NEEDED FOR SLEEP 30 capsule 2  . vitamin B-12 (CYANOCOBALAMIN) 1000 MCG tablet Take 1,000 mcg by mouth daily.    . levothyroxine (SYNTHROID, LEVOTHROID) 100 MCG tablet TAKE ONE TABLET BY MOUTH EVERY DAY (Patient taking differently: TAKE ONE TABLET BY MOUTH IN THE MORNING 30 MINUTES BEFORE BREAKFAST) 90 tablet 1   No current facility-administered medications for this visit.     Allergies:   Ace inhibitors; Benicar [olmesartan medoxomil]; Clarithromycin; Clarithromycin; Norvasc [amlodipine besylate]; Levofloxacin; and Zithromax [azithromycin dihydrate]    Social History:  The patient  reports that  has never smoked. she has  never used smokeless tobacco. She reports that she does not drink alcohol or use drugs.   Family History:  The patient's family history includes Cancer in her daughter; Heart disease in her father; Hypertension in her mother; Kidney cancer in her father; Kidney disease in her daughter and mother; Kidney failure in her mother; Multiple myeloma in her daughter; Rheumatologic disease in her father.    ROS:  Please see the history of present illness.   Otherwise, review of systems are positive for none.   All other systems are reviewed and negative.    PHYSICAL EXAM: VS:  BP (!) 114/40 (BP Location: Left Arm, Patient Position: Sitting, Cuff Size: Normal)   Pulse (!) 59   Ht 4' 11" (1.499 m)   Wt 144 lb (65.3 kg)   BMI 29.08 kg/m  , BMI Body mass index is 29.08 kg/m. GEN: Well nourished, well developed, in no acute distress    HEENT: normal  Neck: no JVD, carotid bruits, or masses Cardiac: Regular rate and rhythm; no murmurs, rubs, or gallops, trace edema  Respiratory:  clear to auscultation bilaterally, normal work of breathing GI: soft, nontender, nondistended, + BS MS: no deformity or atrophy  Skin: warm and dry, no rash Neuro:  Strength and sensation are intact Psych: euthymic mood, full affect   EKG:  EKG is ordered today. The ekg ordered today demonstrates sinus bradycardia with nonspecific ST changes.   Recent Labs: 01/26/2017: ALT 9; TSH 2.310 04/15/2017: B Natriuretic Peptide 1,460.0 04/16/2017: Magnesium 1.8 04/18/2017: Hemoglobin 9.7; Platelets 260 04/19/2017: BUN 32; Creatinine, Ser 1.62; Potassium 3.8; Sodium 139    Lipid Panel    Component Value Date/Time   CHOL 124 06/16/2016 0945   TRIG 87.0 06/16/2016 0945   HDL 42.50 06/16/2016 0945   CHOLHDL 3 06/16/2016 0945   VLDL 17.4 06/16/2016 0945   LDLCALC 64 06/16/2016 0945      Wt Readings from Last 3 Encounters:  06/27/17 144 lb (65.3 kg)  05/03/17 145 lb 6.4 oz (66 kg)  04/24/17 144 lb 8 oz (65.5 kg)      PAD  Screen 01/26/2017  Previous PAD dx? No  Previous surgical procedure? No  Pain with walking? No  Feet/toe relief with dangling? No  Painful, non-healing ulcers? No  Extremities discolored? No      ASSESSMENT AND PLAN:  1.  Persistent atrial fibrillation:  Status post successful cardioversion to sinus rhythm.  Continue small dose of metoprolol and anticoagulation with low-dose Eliquis.  2. Chronic diastolic heart failure with moderate pulmonary hypertension: She appears to be euvolemic on current dose of furosemide.  3. Coronary artery disease involving native coronary arteries with other forms of angina: Nuclear stress test showed evidence of prior inferior and inferolateral infarct with minimal ischemia. Given her age and  chronic kidney disease, we elected for medical therapy.  4. Essential hypertension: Blood pressure is low she has been feeling increasingly dizzy.  I decreased hydralazine to 50 mg 3 times daily instead of 4 times daily.  5. Chronic kidney disease: Stable and followed by nephrology.   Disposition:   FU with me in 3 months  Signed,  Kathlyn Sacramento, MD  06/27/2017 3:20 PM    [place]

## 2017-07-07 ENCOUNTER — Ambulatory Visit: Payer: PPO | Admitting: Cardiovascular Disease

## 2017-08-03 ENCOUNTER — Telehealth: Payer: Self-pay | Admitting: Internal Medicine

## 2017-08-03 MED ORDER — BENZONATATE 200 MG PO CAPS: 200.0000 mg | ORAL_CAPSULE | Freq: Three times a day (TID) | ORAL | 1 refills | Status: DC | PRN

## 2017-08-03 MED ORDER — PREDNISONE 10 MG PO TABS: ORAL_TABLET | ORAL | 0 refills | Status: DC

## 2017-08-03 NOTE — Telephone Encounter (Signed)
Savannah Wilson,  Need to know (in the future,  With all inquiries of this sort)  How long the symptoms have been present.  Prednisone taper and cough suppressant sent to [facility].

## 2017-08-03 NOTE — Telephone Encounter (Signed)
Copied from [place]. Topic: Quick Communication - See Telephone Encounter >> Aug 03, 2017  8:20 AM Robina Ade, Helene Kelp D wrote: CRM for notification. See Telephone encounter for: 08/03/17. Patient called and would like to know if Dr. Derrel Nip can send her something in to her pharmacy for a cold. Please call patient back, thanks.

## 2017-08-03 NOTE — Telephone Encounter (Signed)
Patient has some chest congestion, coughing up yellow mucus. No fever, chills, nasal congestion, and sore throat patient would like to see if provider will send in medication for sx.please advise.

## 2017-08-05 ENCOUNTER — Other Ambulatory Visit: Payer: Self-pay | Admitting: Internal Medicine

## 2017-08-05 DIAGNOSIS — J40 Bronchitis, not specified as acute or chronic: Secondary | ICD-10-CM

## 2017-08-05 MED ORDER — PROMETHAZINE-DM 6.25-15 MG/5ML PO SYRP: 5.0000 mL | ORAL_SOLUTION | Freq: Four times a day (QID) | ORAL | 0 refills | Status: DC | PRN

## 2017-08-05 MED ORDER — AMOXICILLIN-POT CLAVULANATE 875-125 MG PO TABS
1.0000 | ORAL_TABLET | Freq: Two times a day (BID) | ORAL | 0 refills | Status: DC
Start: 2017-08-05 — End: 2017-08-14

## 2017-08-05 NOTE — Assessment & Plan Note (Addendum)
With productive cough.  Augmentin, prednisone, and cough suppressant empirically prescribed via phone encounter (patient well known to prescriber) no fevers,  Chest pain or weight gain to suggest cardiac origin.

## 2017-08-07 ENCOUNTER — Other Ambulatory Visit: Payer: Self-pay | Admitting: Internal Medicine

## 2017-08-13 ENCOUNTER — Other Ambulatory Visit: Payer: Self-pay | Admitting: Internal Medicine

## 2017-08-13 DIAGNOSIS — R05 Cough: Secondary | ICD-10-CM

## 2017-08-13 DIAGNOSIS — R059 Cough, unspecified: Secondary | ICD-10-CM

## 2017-08-14 ENCOUNTER — Ambulatory Visit (INDEPENDENT_AMBULATORY_CARE_PROVIDER_SITE_OTHER): Payer: PPO | Admitting: Internal Medicine

## 2017-08-14 ENCOUNTER — Encounter: Payer: Self-pay | Admitting: Internal Medicine

## 2017-08-14 ENCOUNTER — Ambulatory Visit: Payer: PPO | Admitting: Internal Medicine

## 2017-08-14 ENCOUNTER — Ambulatory Visit (INDEPENDENT_AMBULATORY_CARE_PROVIDER_SITE_OTHER): Payer: PPO

## 2017-08-14 VITALS — BP 156/60 | HR 61 | Temp 97.7°F | Resp 17 | Ht 59.0 in | Wt 146.2 lb

## 2017-08-14 DIAGNOSIS — R05 Cough: Secondary | ICD-10-CM

## 2017-08-14 DIAGNOSIS — J441 Chronic obstructive pulmonary disease with (acute) exacerbation: Secondary | ICD-10-CM | POA: Diagnosis not present

## 2017-08-14 DIAGNOSIS — R0602 Shortness of breath: Secondary | ICD-10-CM

## 2017-08-14 DIAGNOSIS — J181 Lobar pneumonia, unspecified organism: Secondary | ICD-10-CM | POA: Diagnosis not present

## 2017-08-14 DIAGNOSIS — R059 Cough, unspecified: Secondary | ICD-10-CM

## 2017-08-14 DIAGNOSIS — J90 Pleural effusion, not elsewhere classified: Secondary | ICD-10-CM | POA: Diagnosis not present

## 2017-08-14 DIAGNOSIS — R197 Diarrhea, unspecified: Secondary | ICD-10-CM | POA: Diagnosis not present

## 2017-08-14 DIAGNOSIS — J189 Pneumonia, unspecified organism: Secondary | ICD-10-CM | POA: Insufficient documentation

## 2017-08-14 LAB — COMPREHENSIVE METABOLIC PANEL
ALBUMIN: 3.5 g/dL (ref 3.5–5.2)
ALT: 9 U/L (ref 0–35)
AST: 13 U/L (ref 0–37)
Alkaline Phosphatase: 71 U/L (ref 39–117)
BUN: 40 mg/dL — ABNORMAL HIGH (ref 6–23)
CALCIUM: 9.2 mg/dL (ref 8.4–10.5)
CHLORIDE: 104 meq/L (ref 96–112)
CO2: 23 mEq/L (ref 19–32)
Creatinine, Ser: 1.89 mg/dL — ABNORMAL HIGH (ref 0.40–1.20)
GFR: 26.78 mL/min — AB (ref >60.00)
Glucose, Bld: 96 mg/dL (ref 70–99)
Potassium: 4.1 mEq/L (ref 3.5–5.1)
Sodium: 139 mEq/L (ref 135–145)
Total Bilirubin: 0.5 mg/dL (ref 0.2–1.2)
Total Protein: 6.7 g/dL (ref 6.0–8.3)

## 2017-08-14 LAB — BRAIN NATRIURETIC PEPTIDE: Pro B Natriuretic peptide (BNP): 473 pg/mL — ABNORMAL HIGH (ref 0.0–100.0)

## 2017-08-14 MED ORDER — LOSARTAN POTASSIUM 50 MG PO TABS: 50.0000 mg | ORAL_TABLET | Freq: Every day | ORAL | 1 refills | Status: DC

## 2017-08-14 MED ORDER — CEFUROXIME AXETIL 500 MG PO TABS: 500.0000 mg | ORAL_TABLET | Freq: Two times a day (BID) | ORAL | 0 refills | Status: DC

## 2017-08-14 MED ORDER — HYDRALAZINE HCL 50 MG PO TABS: 50.0000 mg | ORAL_TABLET | Freq: Three times a day (TID) | ORAL | 3 refills | Status: DC

## 2017-08-14 MED ORDER — METHYLPREDNISOLONE ACETATE 40 MG/ML IJ SUSP
40.0000 mg | Freq: Once | INTRAMUSCULAR | Status: AC
  Administered 2017-08-14: 40 mg via INTRAMUSCULAR

## 2017-08-14 MED ORDER — FUROSEMIDE 40 MG PO TABS: 40.0000 mg | ORAL_TABLET | Freq: Every day | ORAL | 1 refills | Status: DC

## 2017-08-14 MED ORDER — HYDROCOD POLST-CPM POLST ER 10-8 MG/5ML PO SUER: 5.0000 mL | Freq: Every evening | ORAL | 0 refills | Status: DC | PRN

## 2017-08-14 MED ORDER — METOPROLOL TARTRATE 25 MG PO TABS: 12.5000 mg | ORAL_TABLET | Freq: Two times a day (BID) | ORAL | 3 refills | Status: DC

## 2017-08-14 MED ORDER — IPRATROPIUM-ALBUTEROL 0.5-2.5 (3) MG/3ML IN SOLN: 3.0000 mL | Freq: Four times a day (QID) | RESPIRATORY_TRACT | Status: DC

## 2017-08-14 MED ORDER — IPRATROPIUM BROMIDE 0.02 % IN SOLN
0.5000 mg | Freq: Once | RESPIRATORY_TRACT | Status: AC
  Administered 2017-08-14: 0.5 mg via RESPIRATORY_TRACT

## 2017-08-14 MED ORDER — PREDNISONE 10 MG PO TABS: ORAL_TABLET | ORAL | 0 refills | Status: DC

## 2017-08-14 NOTE — Patient Instructions (Addendum)
There may be a cheaper MDI than Dulera:  Advair   Symbicort,  Memory Dance are possible  choices   I need to see your formulary to know     we are repeating the prednsione taper and you got a shot today    ceftin 500 mg twice daily for 7 days   For your antibiotic   You can  use  The new  cough medicine at night and continue the capsules during the day .  The new medicine has hydrocodone in it  So it might cause sedation and constipation   Yogurt daily  If labs suggest heart failure is playing a role,  I will increase  Your lasix for a few days

## 2017-08-14 NOTE — Progress Notes (Signed)
Subjective:  Patient ID: Savannah Wilson, female    DOB: 1929-06-04  Age: 84 y.o. MRN: [id number]  CC: The primary encounter diagnosis was Shortness of breath. Diagnoses of Diarrhea, unspecified type, COPD exacerbation ([place]), and Pneumonia of both lower lobes due to infectious organism [facility]) were also pertinent to this visit.  HPI Savannah Wilson presents for persist productive cough and dyspnea .  She was Treated for COPD exacerbation on on dec 28th with a 7 day course of augmentin and a 6 day prednisone taper but as had only minimal improvement and continues to report dyspnea with minimal exertion and persistent cough productive of thick yellow sputum. The cough suppressant with promethazine has not been tolerated due to agitation, but she is tolerating the tessalon .  She denies fevers,  But has had anorexia and decreased oral intake.  Her weight has not changed despite decreased oral intake .  She has a cough that is worse at night.   She takes 40 mg lasix daily for chronic diastolic heart failure  Outpatient Medications Prior to Visit  Medication Sig Dispense Refill  . acetaminophen (TYLENOL) 650 MG CR tablet Take 650 mg by mouth every 8 (eight) hours as needed for pain.     Marland Kitchen ALPRAZolam (XANAX) 0.5 MG tablet TAKE ONE AND ONE-HALF TABLETS AT BEDTIMEAS NEEDED FOR ANXIETY 30 tablet 1  . apixaban (ELIQUIS) 2.5 MG TABS tablet Take 1 tablet (2.5 mg total) by mouth 2 (two) times daily. 180 tablet 3  . benzonatate (TESSALON) 200 MG capsule Take 1 capsule (200 mg total) by mouth 3 (three) times daily as needed for cough. 60 capsule 1  . Cholecalciferol (VITAMIN D-3) 1000 units CAPS Take 1,000 Units by mouth daily.     . DULERA 100-5 MCG/ACT AERO USE 2 PUFFS TWICE DAILY. 13 g 3  . fluticasone (FLONASE) 50 MCG/ACT nasal spray Place 2 sprays into the nose daily. (Patient taking differently: Place 2 sprays into the nose daily as needed (For allergies.). ) 16 g 6  . levothyroxine (SYNTHROID, LEVOTHROID) 100  MCG tablet TAKE ONE TABLET BY MOUTH EVERY DAY (Patient taking differently: TAKE ONE TABLET BY MOUTH IN THE MORNING 30 MINUTES BEFORE BREAKFAST) 90 tablet 1  . loperamide (IMODIUM A-D) 2 MG tablet Take 2 mg by mouth 4 (four) times daily as needed for diarrhea or loose stools.     . nystatin (MYCOSTATIN) 100000 UNIT/ML suspension Take 5 mLs (500,000 Units total) by mouth 4 (four) times daily. 60 mL 0  . omeprazole (PRILOSEC) 40 MG capsule TAKE ONE CAPSULE BY MOUTH TWICE A DAY. BEFORE BREAKFAST AND SUPPER (Patient taking differently: TAKE ONE CAPSULE BY MOUTH TWICE A DAY. 30 MINS BEFORE BREAKFAST AND SUPPER) 180 capsule 1  . PROAIR HFA 108 (90 BASE) MCG/ACT inhaler INHALE 2 PUFFS EVERY 6 HOURS AS NEEDED FOR WHEEZING 8.5 g 3  . promethazine-dextromethorphan (PROMETHAZINE-DM) 6.25-15 MG/5ML syrup Take 5 mLs by mouth 4 (four) times daily as needed for cough. 180 mL 0  . Spacer/Aero-Holding Chambers (OPTICHAMBER ADVANTAGE) MISC 1 each by Other route once. Always uses her when you're using a metered-dose inhaler. You've aromatase medicine as much, he won't have his much side effect, but you it twice as much medicine and your lungs. 1 each 0  . temazepam (RESTORIL) 15 MG capsule TAKE 1 CAPSULE AT BEDTIME AS NEEDED FOR SLEEP 30 capsule 2  . vitamin B-12 (CYANOCOBALAMIN) 1000 MCG tablet Take 1,000 mcg by mouth daily.    Marland Kitchen amoxicillin-clavulanate (  AUGMENTIN) 875-125 MG tablet Take 1 tablet by mouth 2 (two) times daily. (Patient not taking: Reported on 08/14/2017) 14 tablet 0  . furosemide (LASIX) 40 MG tablet Take 1 tablet (40 mg total) by mouth daily. 60 tablet 1  . hydrALAZINE (APRESOLINE) 50 MG tablet Take 1 tablet (50 mg total) by mouth 3 (three) times daily. 90 tablet 5  . losartan (COZAAR) 50 MG tablet Take 1 tablet (50 mg total) by mouth daily. 30 tablet 1  . metoprolol tartrate (LOPRESSOR) 25 MG tablet Take 0.5 tablets (12.5 mg total) by mouth 2 (two) times daily. 180 tablet 3  . predniSONE (DELTASONE) 10  MG tablet 6 tablets on Day 1 , then reduce by 1 tablet daily until gone (Patient not taking: Reported on 08/14/2017) 21 tablet 0   No facility-administered medications prior to visit.     Review of Systems;  Patient denies headache, fevers, malaise, unintentional weight loss, skin rash, eye pain, sinus congestion and sinus pain, sore throat, dysphagia,  hemoptysis , cough, dyspnea, wheezing, chest pain, palpitations, orthopnea, edema, abdominal pain, nausea, melena, diarrhea, constipation, flank pain, dysuria, hematuria, urinary  Frequency, nocturia, numbness, tingling, seizures,  Focal weakness, Loss of consciousness,  Tremor, insomnia, depression, anxiety, and suicidal ideation.      Objective:  BP (!) 156/60 (BP Location: Left Arm, Patient Position: Sitting, Cuff Size: Normal)   Pulse 61   Temp 97.7 F (36.5 C) (Oral)   Resp 17   Ht 4\' 11"  (1.499 m)   Wt 146 lb 3.2 oz (66.3 kg)   SpO2 95%   BMI 29.53 kg/m   BP Readings from Last 3 Encounters:  08/14/17 (!) 156/60  06/27/17 (!) 114/40  05/03/17 (!) 112/50    Wt Readings from Last 3 Encounters:  08/14/17 146 lb 3.2 oz (66.3 kg)  06/27/17 144 lb (65.3 kg)  05/03/17 145 lb 6.4 oz (66 kg)    General appearance: alert, cooperative and appears stated age Ears: normal TM's and external ear canals both ears Throat: lips, mucosa, and tongue normal; teeth and gums normal Neck: no adenopathy, no carotid bruit, supple, symmetrical, trachea midline and thyroid not enlarged, symmetric, no tenderness/mass/nodules Back: symmetric, no curvature. ROM normal. No CVA tenderness. Lungs: clear to auscultation bilaterally Heart: regular rate and rhythm, S1, S2 normal, no murmur, click, rub or gallop Abdomen: soft, non-tender; bowel sounds normal; no masses,  no organomegaly Pulses: 2+ and symmetric Skin: Skin color, texture, turgor normal. No rashes or lesions Lymph nodes: Cervical, supraclavicular, and axillary nodes normal.  Lab Results    Component Value Date   HGBA1C 5.0 06/16/2016    Lab Results  Component Value Date   CREATININE 1.89 (H) 08/14/2017   CREATININE 1.62 (H) 04/19/2017   CREATININE 1.99 (H) 04/18/2017    Lab Results  Component Value Date   WBC 6.4 04/18/2017   HGB 9.7 (L) 04/18/2017   HCT 29.1 (L) 04/18/2017   PLT 260 04/18/2017   GLUCOSE 96 08/14/2017   CHOL 124 06/16/2016   TRIG 87.0 06/16/2016   HDL 42.50 06/16/2016   LDLCALC 64 06/16/2016   ALT 9 08/14/2017   AST 13 08/14/2017   NA 139 08/14/2017   K 4.1 08/14/2017   CL 104 08/14/2017   CREATININE 1.89 (H) 08/14/2017   BUN 40 (H) 08/14/2017   CO2 23 08/14/2017   TSH 2.310 01/26/2017   INR 1.48 04/18/2017   HGBA1C 5.0 06/16/2016    Dg Chest Port 1 View  Result Date: 04/19/2017  CLINICAL DATA:  Dyspnea EXAM: PORTABLE CHEST 1 VIEW COMPARISON:  04/17/2017 chest radiograph. FINDINGS: Stable cardiomediastinal silhouette with top-normal heart size, aortic atherosclerosis and moderate hiatal hernia. No pneumothorax. Small right pleural effusion is slightly increased. Stable trace left pleural effusion. No pulmonary edema. Patchy right greater than left lung base opacities, slightly increased. IMPRESSION: 1. Small right pleural effusion, slightly increased. Stable trace left pleural effusion. 2. Patchy right greater than left lung base opacities, slightly increased, favor atelectasis. 3. Moderate hiatal hernia. Electronically Signed   By: Ilona Sorrel M.D.   On: 04/19/2017 08:41    Assessment & Plan:   Problem List Items Addressed This Visit    Bilateral pneumonia    Bilateral opacities on chest x ray, will add second round of abx with cefuroxime  500 mg BID ,  Repeat prednisone taper.  duoneb was ordered by after the patient was sent to the lab it became apparent tnhat only received ipatroprium The current medical regimen is effective;  continue present plan and medications. dulera for management of respiratory symptoms       Relevant  Medications   cefUROXime (CEFTIN) 500 MG tablet   chlorpheniramine-HYDROcodone (TUSSIONEX PENNKINETIC ER) 10-8 MG/5ML SUER   ipratropium (ATROVENT) nebulizer solution 0.5 mg (Completed)    Other Visit Diagnoses    Shortness of breath    -  Primary   Relevant Orders   B Nat Peptide (Completed)   Diarrhea, unspecified type       Relevant Orders   Comprehensive metabolic panel (Completed)   COPD exacerbation (HCC)       Relevant Medications   predniSONE (DELTASONE) 10 MG tablet   chlorpheniramine-HYDROcodone (TUSSIONEX PENNKINETIC ER) 10-8 MG/5ML SUER   methylPREDNISolone acetate (DEPO-MEDROL) injection 40 mg (Completed)   ipratropium (ATROVENT) nebulizer solution 0.5 mg (Completed)      I have discontinued Onnie Boer. Drab's predniSONE and amoxicillin-clavulanate. I am also having her start on predniSONE, cefUROXime, and chlorpheniramine-HYDROcodone. Additionally, I am having her maintain her loperamide, acetaminophen, fluticasone, PROAIR HFA, OPTICHAMBER ADVANTAGE, Vitamin D-3, apixaban, omeprazole, levothyroxine, vitamin B-12, nystatin, ALPRAZolam, temazepam, benzonatate, promethazine-dextromethorphan, DULERA, furosemide, losartan, hydrALAZINE, and metoprolol tartrate. We administered methylPREDNISolone acetate and ipratropium.  Meds ordered this encounter  Medications  . predniSONE (DELTASONE) 10 MG tablet    Sig: 6 tablets on Day 1 , then reduce by 1 tablet daily until gone    Dispense:  21 tablet    Refill:  0  . cefUROXime (CEFTIN) 500 MG tablet    Sig: Take 1 tablet (500 mg total) by mouth 2 (two) times daily with a meal.    Dispense:  14 tablet    Refill:  0  . furosemide (LASIX) 40 MG tablet    Sig: Take 1 tablet (40 mg total) by mouth daily.    Dispense:  90 tablet    Refill:  1  . losartan (COZAAR) 50 MG tablet    Sig: Take 1 tablet (50 mg total) by mouth daily.    Dispense:  90 tablet    Refill:  1  . hydrALAZINE (APRESOLINE) 50 MG tablet    Sig: Take 1 tablet (50  mg total) by mouth 3 (three) times daily.    Dispense:  270 tablet    Refill:  3  . metoprolol tartrate (LOPRESSOR) 25 MG tablet    Sig: Take 0.5 tablets (12.5 mg total) by mouth 2 (two) times daily.    Dispense:  180 tablet    Refill:  3  .  chlorpheniramine-HYDROcodone (TUSSIONEX PENNKINETIC ER) 10-8 MG/5ML SUER    Sig: Take 5 mLs by mouth at bedtime as needed for cough.    Dispense:  140 mL    Refill:  0  . methylPREDNISolone acetate (DEPO-MEDROL) injection 40 mg  . DISCONTD: ipratropium-albuterol (DUONEB) 0.5-2.5 (3) MG/3ML nebulizer solution 3 mL  . ipratropium (ATROVENT) nebulizer solution 0.5 mg    Medications Discontinued During This Encounter  Medication Reason  . amoxicillin-clavulanate (AUGMENTIN) 875-125 MG tablet Completed Course  . predniSONE (DELTASONE) 10 MG tablet Completed Course  . furosemide (LASIX) 40 MG tablet Reorder  . losartan (COZAAR) 50 MG tablet Reorder  . hydrALAZINE (APRESOLINE) 50 MG tablet Reorder  . metoprolol tartrate (LOPRESSOR) 25 MG tablet Reorder  . ipratropium-albuterol (DUONEB) 0.5-2.5 (3) MG/3ML nebulizer solution 3 mL     Follow-up: No Follow-up on file.   [name], MD

## 2017-08-14 NOTE — Assessment & Plan Note (Signed)
Bilateral opacities on chest x ray, will add second round of abx with cefuroxime  500 mg BID ,  Repeat prednisone taper.  duoneb was ordered by after the patient was sent to the lab it became apparent tnhat only received ipatroprium The current medical regimen is effective;  continue present plan and medications. dulera for management of respiratory symptoms

## 2017-08-15 ENCOUNTER — Telehealth: Payer: Self-pay | Admitting: Internal Medicine

## 2017-08-15 ENCOUNTER — Other Ambulatory Visit: Payer: Self-pay | Admitting: Internal Medicine

## 2017-08-15 MED ORDER — IPRATROPIUM-ALBUTEROL 0.5-2.5 (3) MG/3ML IN SOLN: 3.0000 mL | Freq: Four times a day (QID) | RESPIRATORY_TRACT | 0 refills | Status: DC | PRN

## 2017-08-15 MED ORDER — BUDESONIDE 0.25 MG/2ML IN SUSP: 0.2500 mg | Freq: Two times a day (BID) | RESPIRATORY_TRACT | 12 refills | Status: DC

## 2017-08-15 NOTE — Telephone Encounter (Signed)
Savannah Wilson needs alternative COPD medications due to formulary restrictions.  The most cost effective alternative is to get her a home nebulizer and prescribe generic Duo nebs and budesonide (Tier 1,  And Tier 2).  Can you get her the nebulizer ASAP since she is currently symptomatic?  I will send the meds once you get her the nebulizer.

## 2017-08-16 ENCOUNTER — Ambulatory Visit (INDEPENDENT_AMBULATORY_CARE_PROVIDER_SITE_OTHER): Payer: PPO | Admitting: *Deleted

## 2017-08-16 ENCOUNTER — Ambulatory Visit: Payer: Self-pay

## 2017-08-16 VITALS — HR 89 | Resp 18

## 2017-08-16 DIAGNOSIS — J441 Chronic obstructive pulmonary disease with (acute) exacerbation: Secondary | ICD-10-CM

## 2017-08-16 NOTE — Telephone Encounter (Signed)
Meds cannot be mixed. Pt should receive Duoneb first then Pulmicort. Can be given right after each other but not mixed together. Pt's daughter states understanding. Reason for Disposition . General information question, no triage required and triager able to answer question  Answer Assessment - Initial Assessment Questions 1. REASON FOR CALL or QUESTION: "What is your reason for calling today?" or "How can I best help you?" or "What question do you have that I can help answer?" Patient daughter calling to see how far aprt should patient take these medicines budesonide (PULMICORT) 0.25 MG/2ML nebulizer solution  And  ipratropium-albuterol (DUONEB) 0.5-2.5 (3) MG/3ML SOLN.  Protocols used: INFORMATION ONLY CALL-A-AH

## 2017-08-16 NOTE — Telephone Encounter (Signed)
Good .  Please give patient a DuoNeb treatment while she is here .  I hve sent the tow nebulized meds to her local pharmacy

## 2017-08-16 NOTE — Telephone Encounter (Signed)
I have the nebulizer she can pick up in office right now. Have notified patient.

## 2017-08-18 ENCOUNTER — Encounter: Payer: Self-pay | Admitting: *Deleted

## 2017-08-18 DIAGNOSIS — J441 Chronic obstructive pulmonary disease with (acute) exacerbation: Secondary | ICD-10-CM | POA: Diagnosis not present

## 2017-08-18 MED ORDER — ALBUTEROL SULFATE (2.5 MG/3ML) 0.083% IN NEBU
2.5000 mg | INHALATION_SOLUTION | Freq: Once | RESPIRATORY_TRACT | Status: AC
  Administered 2017-08-18: 2.5 mg via RESPIRATORY_TRACT

## 2017-08-18 MED ORDER — IPRATROPIUM BROMIDE 0.02 % IN SOLN
0.2500 mg | Freq: Once | RESPIRATORY_TRACT | Status: AC
Start: 2017-08-18 — End: 2017-08-18
  Administered 2017-08-18: 0.25 mg via RESPIRATORY_TRACT

## 2017-08-18 NOTE — Progress Notes (Addendum)
patient came into office for nebulizer teaching, and for duo -neb administration. Patient was given teaching during administration and patient voiced understanding to teaching.  Auscultated lung bilaterally before administration expiratory and inspiratory wheezing noted bilaterally, after administration some wheezing still present but lessened. 02 Sat before treatment taken 96 after administration 98, patient tolerated well.   I have reviewed the above information and agree with above.   Deborra Medina, MD

## 2017-08-22 ENCOUNTER — Other Ambulatory Visit: Payer: Self-pay | Admitting: Internal Medicine

## 2017-09-09 ENCOUNTER — Other Ambulatory Visit: Payer: Self-pay | Admitting: Internal Medicine

## 2017-09-10 ENCOUNTER — Other Ambulatory Visit: Payer: Self-pay | Admitting: Internal Medicine

## 2017-09-10 DIAGNOSIS — J189 Pneumonia, unspecified organism: Secondary | ICD-10-CM

## 2017-09-10 DIAGNOSIS — J181 Lobar pneumonia, unspecified organism: Principal | ICD-10-CM

## 2017-09-10 MED ORDER — DOXYCYCLINE HYCLATE 100 MG PO TABS: 100.0000 mg | ORAL_TABLET | Freq: Two times a day (BID) | ORAL | 0 refills | Status: DC

## 2017-09-10 MED ORDER — PREDNISONE 10 MG PO TABS: ORAL_TABLET | ORAL | 0 refills | Status: DC

## 2017-09-11 ENCOUNTER — Ambulatory Visit (INDEPENDENT_AMBULATORY_CARE_PROVIDER_SITE_OTHER): Payer: PPO

## 2017-09-11 DIAGNOSIS — J181 Lobar pneumonia, unspecified organism: Secondary | ICD-10-CM

## 2017-09-11 DIAGNOSIS — J189 Pneumonia, unspecified organism: Secondary | ICD-10-CM

## 2017-09-11 DIAGNOSIS — R0602 Shortness of breath: Secondary | ICD-10-CM | POA: Diagnosis not present

## 2017-09-12 ENCOUNTER — Encounter: Payer: Self-pay | Admitting: Internal Medicine

## 2017-09-12 ENCOUNTER — Telehealth: Payer: Self-pay | Admitting: Internal Medicine

## 2017-09-12 NOTE — Telephone Encounter (Signed)
Pt. Called to ask if she can take Prilosec with Doxycycline. Instructed to check with her pharmacist. No information in [employer].

## 2017-09-19 ENCOUNTER — Other Ambulatory Visit: Payer: Self-pay

## 2017-09-19 ENCOUNTER — Inpatient Hospital Stay
Admission: EM | Admit: 2017-09-19 | Discharge: 2017-09-21 | DRG: 684 | Disposition: A | Discharge status: Home / Self Care | Payer: PPO | Attending: Internal Medicine | Admitting: Internal Medicine

## 2017-09-19 ENCOUNTER — Encounter: Payer: Self-pay | Admitting: Emergency Medicine

## 2017-09-19 DIAGNOSIS — M6281 Muscle weakness (generalized): Secondary | ICD-10-CM | POA: Diagnosis not present

## 2017-09-19 DIAGNOSIS — R531 Weakness: Secondary | ICD-10-CM | POA: Diagnosis not present

## 2017-09-19 DIAGNOSIS — J45909 Unspecified asthma, uncomplicated: Secondary | ICD-10-CM | POA: Diagnosis present

## 2017-09-19 DIAGNOSIS — F329 Major depressive disorder, single episode, unspecified: Secondary | ICD-10-CM | POA: Diagnosis present

## 2017-09-19 DIAGNOSIS — Z7901 Long term (current) use of anticoagulants: Secondary | ICD-10-CM | POA: Diagnosis not present

## 2017-09-19 DIAGNOSIS — I4891 Unspecified atrial fibrillation: Secondary | ICD-10-CM | POA: Diagnosis not present

## 2017-09-19 DIAGNOSIS — R630 Anorexia: Secondary | ICD-10-CM | POA: Diagnosis present

## 2017-09-19 DIAGNOSIS — M109 Gout, unspecified: Secondary | ICD-10-CM | POA: Diagnosis not present

## 2017-09-19 DIAGNOSIS — K219 Gastro-esophageal reflux disease without esophagitis: Secondary | ICD-10-CM | POA: Diagnosis not present

## 2017-09-19 DIAGNOSIS — Z888 Allergy status to other drugs, medicaments and biological substances status: Secondary | ICD-10-CM | POA: Diagnosis not present

## 2017-09-19 DIAGNOSIS — I129 Hypertensive chronic kidney disease with stage 1 through stage 4 chronic kidney disease, or unspecified chronic kidney disease: Secondary | ICD-10-CM | POA: Diagnosis not present

## 2017-09-19 DIAGNOSIS — N189 Chronic kidney disease, unspecified: Secondary | ICD-10-CM

## 2017-09-19 DIAGNOSIS — E876 Hypokalemia: Secondary | ICD-10-CM | POA: Diagnosis not present

## 2017-09-19 DIAGNOSIS — N289 Disorder of kidney and ureter, unspecified: Secondary | ICD-10-CM | POA: Diagnosis not present

## 2017-09-19 DIAGNOSIS — Z881 Allergy status to other antibiotic agents status: Secondary | ICD-10-CM

## 2017-09-19 DIAGNOSIS — N179 Acute kidney failure, unspecified: Secondary | ICD-10-CM | POA: Diagnosis not present

## 2017-09-19 DIAGNOSIS — I739 Peripheral vascular disease, unspecified: Secondary | ICD-10-CM | POA: Diagnosis present

## 2017-09-19 DIAGNOSIS — I340 Nonrheumatic mitral (valve) insufficiency: Secondary | ICD-10-CM | POA: Diagnosis not present

## 2017-09-19 DIAGNOSIS — E785 Hyperlipidemia, unspecified: Secondary | ICD-10-CM | POA: Diagnosis not present

## 2017-09-19 DIAGNOSIS — N184 Chronic kidney disease, stage 4 (severe): Secondary | ICD-10-CM | POA: Diagnosis not present

## 2017-09-19 DIAGNOSIS — E039 Hypothyroidism, unspecified: Secondary | ICD-10-CM | POA: Diagnosis not present

## 2017-09-19 DIAGNOSIS — E860 Dehydration: Secondary | ICD-10-CM | POA: Diagnosis not present

## 2017-09-19 DIAGNOSIS — I10 Essential (primary) hypertension: Secondary | ICD-10-CM | POA: Diagnosis not present

## 2017-09-19 DIAGNOSIS — Z79899 Other long term (current) drug therapy: Secondary | ICD-10-CM | POA: Diagnosis not present

## 2017-09-19 DIAGNOSIS — I480 Paroxysmal atrial fibrillation: Secondary | ICD-10-CM | POA: Diagnosis present

## 2017-09-19 DIAGNOSIS — R110 Nausea: Secondary | ICD-10-CM | POA: Diagnosis not present

## 2017-09-19 DIAGNOSIS — R5383 Other fatigue: Secondary | ICD-10-CM | POA: Diagnosis not present

## 2017-09-19 LAB — BASIC METABOLIC PANEL
ANION GAP: 14 (ref 5–15)
BUN: 78 mg/dL — ABNORMAL HIGH (ref 6–20)
CHLORIDE: 103 mmol/L (ref 101–111)
CO2: 18 mmol/L — ABNORMAL LOW (ref 22–32)
Calcium: 9.6 mg/dL (ref 8.9–10.3)
Creatinine, Ser: 2.57 mg/dL — ABNORMAL HIGH (ref 0.44–1.00)
GFR calc Af Amer: 18 mL/min — ABNORMAL LOW (ref >60)
GFR, EST NON AFRICAN AMERICAN: 16 mL/min — AB (ref >60)
Glucose, Bld: 103 mg/dL — ABNORMAL HIGH (ref 65–99)
POTASSIUM: 3.5 mmol/L (ref 3.5–5.1)
SODIUM: 135 mmol/L (ref 135–145)

## 2017-09-19 LAB — CBC
HEMATOCRIT: 36.9 % (ref 35.0–47.0)
HEMOGLOBIN: 12 g/dL (ref 12.0–16.0)
MCH: 29.3 pg (ref 26.0–34.0)
MCHC: 32.5 g/dL (ref 32.0–36.0)
MCV: 90.2 fL (ref 80.0–100.0)
Platelets: 303 10*3/uL (ref 150–440)
RBC: 4.09 MIL/uL (ref 3.80–5.20)
RDW: 14.6 % — ABNORMAL HIGH (ref 11.5–14.5)
WBC: 9.4 10*3/uL (ref 3.6–11.0)

## 2017-09-19 LAB — URINALYSIS, COMPLETE (UACMP) WITH MICROSCOPIC
BACTERIA UA: NONE SEEN
Bilirubin Urine: NEGATIVE
Glucose, UA: NEGATIVE mg/dL
Hgb urine dipstick: NEGATIVE
KETONES UR: NEGATIVE mg/dL
NITRITE: NEGATIVE
PH: 5 (ref 5.0–8.0)
PROTEIN: NEGATIVE mg/dL
Specific Gravity, Urine: 1.006 (ref 1.005–1.030)

## 2017-09-19 LAB — INFLUENZA PANEL BY PCR (TYPE A & B)
INFLBPCR: NEGATIVE
Influenza A By PCR: NEGATIVE

## 2017-09-19 MED ORDER — APIXABAN 2.5 MG PO TABS
2.5000 mg | ORAL_TABLET | Freq: Two times a day (BID) | ORAL | Status: DC
  Administered 2017-09-19 – 2017-09-21 (×4): 2.5 mg via ORAL
  Filled 2017-09-19 (×4): qty 1

## 2017-09-19 MED ORDER — ACETAMINOPHEN 325 MG PO TABS
650.0000 mg | ORAL_TABLET | Freq: Four times a day (QID) | ORAL | Status: DC | PRN
  Administered 2017-09-20 (×2): 650 mg via ORAL
  Filled 2017-09-19 (×2): qty 2

## 2017-09-19 MED ORDER — ONDANSETRON HCL 4 MG PO TABS: 4.0000 mg | ORAL_TABLET | Freq: Four times a day (QID) | ORAL | Status: DC | PRN

## 2017-09-19 MED ORDER — PANTOPRAZOLE SODIUM 40 MG PO TBEC
40.0000 mg | DELAYED_RELEASE_TABLET | Freq: Every day | ORAL | Status: DC
  Administered 2017-09-20 – 2017-09-21 (×2): 40 mg via ORAL
  Filled 2017-09-19 (×2): qty 1

## 2017-09-19 MED ORDER — SODIUM CHLORIDE 0.9 % IV BOLUS (SEPSIS)
500.0000 mL | INTRAVENOUS | Status: AC
  Administered 2017-09-19: 500 mL via INTRAVENOUS

## 2017-09-19 MED ORDER — ONDANSETRON HCL 4 MG/2ML IJ SOLN: 4.0000 mg | Freq: Four times a day (QID) | INTRAMUSCULAR | Status: DC | PRN

## 2017-09-19 MED ORDER — BENZONATATE 100 MG PO CAPS: 200.0000 mg | ORAL_CAPSULE | Freq: Three times a day (TID) | ORAL | Status: DC | PRN

## 2017-09-19 MED ORDER — ACETAMINOPHEN 650 MG RE SUPP: 650.0000 mg | Freq: Four times a day (QID) | RECTAL | Status: DC | PRN

## 2017-09-19 MED ORDER — VITAMIN D 1000 UNITS PO TABS
1000.0000 [IU] | ORAL_TABLET | Freq: Every day | ORAL | Status: DC
  Administered 2017-09-20 – 2017-09-21 (×2): 1000 [IU] via ORAL
  Filled 2017-09-19 (×2): qty 1

## 2017-09-19 MED ORDER — ALPRAZOLAM 1 MG PO TABS: 1.0000 mg | ORAL_TABLET | Freq: Every evening | ORAL | Status: DC | PRN

## 2017-09-19 MED ORDER — LEVOTHYROXINE SODIUM 100 MCG PO TABS
100.0000 ug | ORAL_TABLET | Freq: Every day | ORAL | Status: DC
  Administered 2017-09-20 – 2017-09-21 (×2): 100 ug via ORAL
  Filled 2017-09-19 (×2): qty 1

## 2017-09-19 MED ORDER — BUDESONIDE 0.25 MG/2ML IN SUSP
0.2500 mg | Freq: Two times a day (BID) | RESPIRATORY_TRACT | Status: DC
  Administered 2017-09-20: 0.25 mg via RESPIRATORY_TRACT
  Filled 2017-09-19: qty 2

## 2017-09-19 MED ORDER — MOMETASONE FURO-FORMOTEROL FUM 100-5 MCG/ACT IN AERO
2.0000 | INHALATION_SPRAY | Freq: Two times a day (BID) | RESPIRATORY_TRACT | Status: DC
  Administered 2017-09-20: 2 via RESPIRATORY_TRACT
  Filled 2017-09-19: qty 8.8

## 2017-09-19 MED ORDER — LOSARTAN POTASSIUM 50 MG PO TABS
50.0000 mg | ORAL_TABLET | Freq: Every day | ORAL | Status: DC
  Administered 2017-09-20 – 2017-09-21 (×2): 50 mg via ORAL
  Filled 2017-09-19 (×2): qty 1

## 2017-09-19 MED ORDER — ACETAMINOPHEN ER 650 MG PO TBCR: 650.0000 mg | EXTENDED_RELEASE_TABLET | Freq: Three times a day (TID) | ORAL | Status: DC | PRN

## 2017-09-19 MED ORDER — HYDRALAZINE HCL 50 MG PO TABS
50.0000 mg | ORAL_TABLET | Freq: Three times a day (TID) | ORAL | Status: DC
  Administered 2017-09-19 – 2017-09-21 (×5): 50 mg via ORAL
  Filled 2017-09-19 (×5): qty 1

## 2017-09-19 MED ORDER — MEGESTROL ACETATE 20 MG PO TABS
40.0000 mg | ORAL_TABLET | Freq: Every day | ORAL | Status: DC
  Administered 2017-09-19 – 2017-09-20 (×2): 40 mg via ORAL
  Filled 2017-09-19: qty 1
  Filled 2017-09-19: qty 2

## 2017-09-19 MED ORDER — LOPERAMIDE HCL 2 MG PO CAPS
2.0000 mg | ORAL_CAPSULE | Freq: Four times a day (QID) | ORAL | Status: DC | PRN
  Filled 2017-09-19: qty 1

## 2017-09-19 MED ORDER — ALBUTEROL SULFATE (2.5 MG/3ML) 0.083% IN NEBU: 2.5000 mg | INHALATION_SOLUTION | Freq: Four times a day (QID) | RESPIRATORY_TRACT | Status: DC | PRN

## 2017-09-19 MED ORDER — ENSURE ENLIVE PO LIQD
237.0000 mL | Freq: Two times a day (BID) | ORAL | Status: DC
  Administered 2017-09-20 – 2017-09-21 (×3): 237 mL via ORAL

## 2017-09-19 MED ORDER — IPRATROPIUM-ALBUTEROL 0.5-2.5 (3) MG/3ML IN SOLN: 3.0000 mL | Freq: Four times a day (QID) | RESPIRATORY_TRACT | Status: DC | PRN

## 2017-09-19 MED ORDER — METOPROLOL TARTRATE 25 MG PO TABS
12.5000 mg | ORAL_TABLET | Freq: Two times a day (BID) | ORAL | Status: DC
  Administered 2017-09-19 – 2017-09-21 (×4): 12.5 mg via ORAL
  Filled 2017-09-19 (×4): qty 1

## 2017-09-19 MED ORDER — FLUTICASONE PROPIONATE 50 MCG/ACT NA SUSP
2.0000 | Freq: Every day | NASAL | Status: DC
  Administered 2017-09-20 – 2017-09-21 (×2): 2 via NASAL
  Filled 2017-09-19 (×2): qty 16

## 2017-09-19 MED ORDER — HEPARIN SODIUM (PORCINE) 5000 UNIT/ML IJ SOLN: 5000.0000 [IU] | Freq: Three times a day (TID) | INTRAMUSCULAR | Status: DC

## 2017-09-19 MED ORDER — SODIUM CHLORIDE 0.9 % IV SOLN
INTRAVENOUS | Status: DC
  Administered 2017-09-19 – 2017-09-21 (×3): via INTRAVENOUS

## 2017-09-19 MED ORDER — TEMAZEPAM 7.5 MG PO CAPS
7.5000 mg | ORAL_CAPSULE | Freq: Every evening | ORAL | Status: DC | PRN
  Administered 2017-09-20: 7.5 mg via ORAL
  Filled 2017-09-19: qty 1

## 2017-09-19 NOTE — ED Notes (Signed)
Admitting MD at bedside.

## 2017-09-19 NOTE — H&P (Signed)
Savannah Wilson at [facility] NAME: Savannah Wilson    MR#:  [id number]  DATE OF BIRTH:  05-12-1929  DATE OF ADMISSION:  09/19/2017  PRIMARY CARE PHYSICIAN: Savannah Mc, Wilson   REQUESTING/REFERRING PHYSICIAN: Hinda Kehr Wilson  CHIEF COMPLAINT:   Chief Complaint  Patient presents with  . Weakness    HISTORY OF PRESENT ILLNESS: Savannah Wilson  is a 84 y.o. female with a known history of asthma, chronic kidney disease stage IV, depression, GERD, gout, hyperlipidemia, essential hypertension, hypothyroidism and paroxysmal atrial fibrillation who has been sick since December patient has had been treated with 3 rounds of antibiotics for pneumonia.  She also has been treated with prednisone therapy.  Today she is presenting with generalized weakness and feeling like she was going to pass out.  Patient has progressive weakness and has not been eating or drinking much.  She is noted to have acute renal failure on chronic kidney disease. PAST MEDICAL HISTORY:   Past Medical History:  Diagnosis Date  . Asthma   . Barrett esophagus   . Chronic bronchitis   . Chronic kidney disease (CKD), stage IV (severe) ([place])   . Depression   . Diverticulosis   . GERD (gastroesophageal reflux disease)   . Gout   . Hyperlipidemia   . Hypertension   . Hypothyroid   . Mitral regurgitation    a. TTE 02/2017: EF 55-60%, normal WM, calcified mitral annulus with mod regurg, moderately dilated LA, unable to estimate PASP  . Pancreatitis due to biliary obstruction March 2010   s/p ERCP sphincterotomy, cholecystectomy  . Peripheral neuralgia   . Persistent atrial fibrillation ([place])    a. diagnosed 01/24/17; CHADS2VASc => 5 (HTN, age x 2, vascular disease with PAD and aortic plaque, female) giving her an estimated annual stroke risk of 6.7%; b. successful DCCV 03/28/2017; c. on Eliquis  . Venous insufficiency     PAST SURGICAL HISTORY:  Past Surgical History:  Procedure Laterality Date   . ABDOMINAL HYSTERECTOMY    . CARDIOVERSION N/A 03/28/2017   Procedure: CARDIOVERSION;  Surgeon: Nelva Bush, Wilson;  Location: [facility];  Service: Cardiovascular;  Laterality: N/A;  . CARPAL TUNNEL RELEASE  jan 2013   Savannah Wilson  . CATARACT EXTRACTION     right  . CATARACT EXTRACTION  2008  . CHOLECYSTECTOMY  02/10  . CHOLECYSTECTOMY  2010  . TONSILLECTOMY    . VENTRAL HERNIA REPAIR    . VENTRAL HERNIA REPAIR  2007  . VESICOVAGINAL FISTULA CLOSURE W/ TAH      SOCIAL HISTORY:  Social History   Tobacco Use  . Smoking status: Never Smoker  . Smokeless tobacco: Never Used  Substance Use Topics  . Alcohol use: No    Comment: rare    FAMILY HISTORY:  Family History  Problem Relation Age of Onset  . Kidney failure Mother   . Hypertension Mother   . Kidney disease Mother   . Multiple myeloma Daughter   . Cancer Daughter        multiple myeloma  . Kidney disease Daughter   . Heart disease Father   . Rheumatologic disease Father   . Kidney cancer Father     DRUG ALLERGIES:  Allergies  Allergen Reactions  . Ace Inhibitors Cough  . Benicar [Olmesartan Medoxomil]     Unsure of reaction type  . Clarithromycin Other (See Comments)    Blisters in mouth  . Clarithromycin Other (See Comments)  Unknown   . Norvasc [Amlodipine Besylate]   . Levofloxacin Rash  . Zithromax [Azithromycin Dihydrate] Rash    REVIEW OF SYSTEMS:   CONSTITUTIONAL: No fever, positive fatigue positive weakness.  EYES: No blurred or double vision.  EARS, NOSE, AND THROAT: No tinnitus or ear pain.  RESPIRATORY: No cough, shortness of breath, wheezing or hemoptysis.  CARDIOVASCULAR: No chest pain, orthopnea, edema.  GASTROINTESTINAL: No nausea, vomiting, diarrhea or abdominal pain.  GENITOURINARY: No dysuria, hematuria.  ENDOCRINE: No polyuria, nocturia,  HEMATOLOGY: No anemia, easy bruising or bleeding SKIN: No rash or lesion. MUSCULOSKELETAL: No joint pain or arthritis.   NEUROLOGIC: No  tingling, numbness, weakness.  PSYCHIATRY: No anxiety or depression.   MEDICATIONS AT HOME:  Prior to Admission medications   Medication Sig Start Date End Date Taking? Authorizing Provider  acetaminophen (TYLENOL) 650 MG CR tablet Take 650 mg by mouth every 8 (eight) hours as needed for pain.     Provider, Historical, Wilson  ALPRAZolam Duanne Moron) 0.5 MG tablet TAKE ONE AND ONE-HALF TABLETS AT BEDTIMEAS NEEDED FOR ANXIETY 04/20/17   Savannah Mc, Wilson  apixaban (ELIQUIS) 2.5 MG TABS tablet Take 1 tablet (2.5 mg total) by mouth 2 (two) times daily. 01/26/17   Dunn, Areta Haber, PA-C  benzonatate (TESSALON) 200 MG capsule Take 1 capsule (200 mg total) by mouth 3 (three) times daily as needed for cough. 08/03/17   Savannah Mc, Wilson  budesonide (PULMICORT) 0.25 MG/2ML nebulizer solution Take 2 mLs (0.25 mg total) by nebulization 2 (two) times daily. 08/15/17   Savannah Mc, Wilson  cefUROXime (CEFTIN) 500 MG tablet Take 1 tablet (500 mg total) by mouth 2 (two) times daily with a meal. 08/14/17   Savannah Mc, Wilson  chlorpheniramine-HYDROcodone (TUSSIONEX PENNKINETIC ER) 10-8 MG/5ML SUER Take 5 mLs by mouth at bedtime as needed for cough. 08/14/17   Savannah Mc, Wilson  Cholecalciferol (VITAMIN D-3) 1000 units CAPS Take 1,000 Units by mouth daily.     Provider, Historical, Wilson  doxycycline (VIBRA-TABS) 100 MG tablet Take 1 tablet (100 mg total) by mouth 2 (two) times daily. 09/10/17   Savannah Mc, Wilson  DULERA 100-5 MCG/ACT AERO USE 2 PUFFS TWICE DAILY. 08/07/17   Savannah Mc, Wilson  fluticasone (FLONASE) 50 MCG/ACT nasal spray Place 2 sprays into the nose daily. Patient taking differently: Place 2 sprays into the nose daily as needed (For allergies.).  01/30/13   Savannah Mc, Wilson  furosemide (LASIX) 40 MG tablet Take 1 tablet (40 mg total) by mouth daily. 08/14/17 11/12/17  Savannah Mc, Wilson  hydrALAZINE (APRESOLINE) 50 MG tablet Take 1 tablet (50 mg total) by mouth 3 (three) times daily. 08/14/17 09/13/17  Savannah Mc, Wilson  ipratropium-albuterol (DUONEB) 0.5-2.5 (3) MG/3ML SOLN Take 3 mLs by nebulization every 6 (six) hours as needed. 08/15/17   Savannah Mc, Wilson  levothyroxine (SYNTHROID, LEVOTHROID) 100 MCG tablet TAKE ONE TABLET BY MOUTH EVERY DAY 09/11/17   Savannah Mc, Wilson  loperamide (IMODIUM A-D) 2 MG tablet Take 2 mg by mouth 4 (four) times daily as needed for diarrhea or loose stools.     Provider, Historical, Wilson  losartan (COZAAR) 50 MG tablet Take 1 tablet (50 mg total) by mouth daily. 08/14/17 09/13/17  Savannah Mc, Wilson  metoprolol tartrate (LOPRESSOR) 25 MG tablet Take 0.5 tablets (12.5 mg total) by mouth 2 (two) times daily. 08/14/17 11/12/17  Savannah Mc, Wilson  nystatin (MYCOSTATIN) 100000 UNIT/ML  suspension Take 5 mLs (500,000 Units total) by mouth 4 (four) times daily. 04/06/17   Savannah Mc, Wilson  omeprazole (PRILOSEC) 40 MG capsule TAKE ONE CAPSULE BY MOUTH TWICE A DAY. BEFORE BREAKFAST AND SUPPER 08/22/17   Savannah Mc, Wilson  predniSONE (DELTASONE) 10 MG tablet 6 tablets on Day 1 , then reduce by 1 tablet daily until gone 08/14/17   Savannah Mc, Wilson  predniSONE (DELTASONE) 10 MG tablet 6 tablets on Day 1 , then reduce by 1 tablet daily until gone 09/10/17   Savannah Mc, Wilson  PROAIR HFA 108 (90 BASE) MCG/ACT inhaler INHALE 2 PUFFS EVERY 6 HOURS AS NEEDED FOR WHEEZING 03/13/15   Savannah Mc, Wilson  promethazine-dextromethorphan (PROMETHAZINE-DM) 6.25-15 MG/5ML syrup Take 5 mLs by mouth 4 (four) times daily as needed for cough. 08/05/17   Savannah Mc, Wilson  Spacer/Aero-Holding Chambers [facility] ADVANTAGE) MISC 1 each by Other route once. Always uses her when you're using a metered-dose inhaler. You've aromatase medicine as much, he won't have his much side effect, but you it twice as much medicine and your lungs. 06/08/15   Ahmed Prima, Wilson  temazepam (RESTORIL) 15 MG capsule TAKE 1 CAPSULE AT BEDTIME AS NEEDED FOR SLEEP 05/03/17   Savannah Mc, Wilson  vitamin B-12 (CYANOCOBALAMIN) 1000  MCG tablet Take 1,000 mcg by mouth daily.    Provider, Historical, Wilson      PHYSICAL EXAMINATION:   VITAL SIGNS: Blood pressure (!) 155/45, pulse (!) 55, temperature 98.1 F (36.7 C), temperature source Oral, resp. rate 18, height 5' (1.524 m), weight 146 lb (66.2 kg), SpO2 100 %.  GENERAL:  84 y.o.-year-old patient lying in the bed with no acute distress.  EYES: Pupils equal, round, reactive to light and accommodation. No scleral icterus. Extraocular muscles intact.  HEENT: Head atraumatic, normocephalic. Oropharynx and nasopharynx clear.  NECK:  Supple, no jugular venous distention. No thyroid enlargement, no tenderness.  LUNGS: Normal breath sounds bilaterally, no wheezing, rales,rhonchi or crepitation. No use of accessory muscles of respiration.  CARDIOVASCULAR: S1, S2 normal. No murmurs, rubs, or gallops.  ABDOMEN: Soft, nontender, nondistended. Bowel sounds present. No organomegaly or mass.  EXTREMITIES: No pedal edema, cyanosis, or clubbing.  NEUROLOGIC: Cranial nerves II through XII are intact. Muscle strength 5/5 in all extremities. Sensation intact. Gait not checked.  PSYCHIATRIC: The patient is alert and oriented x 3.  SKIN: No obvious rash, lesion, or ulcer.   LABORATORY PANEL:   CBC Recent Labs  Lab 09/19/17 1649  WBC 9.4  HGB 12.0  HCT 36.9  PLT 303  MCV 90.2  MCH 29.3  MCHC 32.5  RDW 14.6*   ------------------------------------------------------------------------------------------------------------------  Chemistries  Recent Labs  Lab 09/19/17 1649  NA 135  K 3.5  CL 103  CO2 18*  GLUCOSE 103*  BUN 78*  CREATININE 2.57*  CALCIUM 9.6   ------------------------------------------------------------------------------------------------------------------ estimated creatinine clearance is 13.3 mL/min (A) (by C-G formula based on SCr of 2.57 mg/dL  (H)). ------------------------------------------------------------------------------------------------------------------ No results for input(s): TSH, T4TOTAL, T3FREE, THYROIDAB in the last 72 hours.  Invalid input(s): FREET3   Coagulation profile No results for input(s): INR, PROTIME in the last 168 hours. ------------------------------------------------------------------------------------------------------------------- No results for input(s): DDIMER in the last 72 hours. -------------------------------------------------------------------------------------------------------------------  Cardiac Enzymes No results for input(s): CKMB, TROPONINI, MYOGLOBIN in the last 168 hours.  Invalid input(s): CK ------------------------------------------------------------------------------------------------------------------ Invalid input(s): POCBNP  ---------------------------------------------------------------------------------------------------------------  Urinalysis    Component Value Date/Time   COLORURINE COLORLESS (A) 04/15/2017 1328  APPEARANCEUR CLEAR (A) 04/15/2017 1328   APPEARANCEUR Clear 08/23/2012 0459   LABSPEC 1.004 (L) 04/15/2017 1328   LABSPEC 1.006 08/23/2012 0459   PHURINE 5.0 04/15/2017 1328   GLUCOSEU NEGATIVE 04/15/2017 1328   GLUCOSEU Negative 08/23/2012 0459   HGBUR SMALL (A) 04/15/2017 1328   BILIRUBINUR NEGATIVE 04/15/2017 1328   BILIRUBINUR neg 04/16/2013 1550   BILIRUBINUR Negative 08/23/2012 [place] 04/15/2017 1328   PROTEINUR NEGATIVE 04/15/2017 1328   UROBILINOGEN 0.2 04/16/2013 1550   NITRITE NEGATIVE 04/15/2017 1328   LEUKOCYTESUR NEGATIVE 04/15/2017 1328   LEUKOCYTESUR Negative 08/23/2012 0459     RADIOLOGY: No results found.  EKG: Orders placed or performed during the hospital encounter of 09/19/17  . EKG 12-Lead  . EKG 12-Lead  . ED EKG  . ED EKG    IMPRESSION AND PLAN: Patient is a 84 year old white female with  chronic kidney disease presenting with weakness  1.  Acute renal failure on chronic kidney disease stage IV This is due to dehydration Give IV fluids Stop Lasix Obtain a PT evaluation   2.  History of atrial fibrillation Continue Eliquis and Lopressor  3.  Essential hypertension continue hydralazine and Lopressor  4.  Hypothyroidism continue Synthroid  5.  GERD continue Prilosec  6.  History of bronchospasm and asthma continue inhalers as taking at home  7.  Poor appetite start Megace  All the records are reviewed and case discussed with ED provider. Management plans discussed with the patient, family and they are in agreement.  CODE STATUS: Code Status History    Date Active Date Inactive Code Status Order ID Comments User Context   04/15/2017 15:25 04/19/2017 19:27 Full Code [id number]  Gladstone Lighter, Wilson Inpatient    Advance Directive Documentation     Most Recent Value  Type of Advance Directive  Healthcare Power of Attorney, Living will  Pre-existing out of facility DNR order (yellow form or pink MOST form)  No data  "MOST" Form in Place?  No data       TOTAL TIME TAKING CARE OF THIS PATIENT: 55 minutes.    Dustin Flock M.D on 09/19/2017 at 6:51 PM  Between 7am to 6pm - Pager - [phone]  After 6pm go to [email or web address] - password [email or web address] [facility]  Office  [phone]  CC: Primary care physician; Savannah Mc, Wilson

## 2017-09-19 NOTE — ED Triage Notes (Signed)
Pt to ED via [facility] with complaints of weakness. She reports that she just feeling well since after Christmas, she has been to her PMD. She was given prednisone, breathing treatments, and antibiotics and is still not feeling well. She reports that she has not been able to eat. Denies any fever.

## 2017-09-19 NOTE — ED Notes (Signed)
This RN unable to obtain IV site. Tried x2 with 24g. Veins bruised and blew with each needle stick. IV team consult placed for fluids and admission requirement.

## 2017-09-19 NOTE — ED Provider Notes (Signed)
[facility] Emergency Department Provider Note  ____________________________________________   First MD Initiated Contact with Patient 09/19/17 1739     (approximate)  I have reviewed the triage vital signs and the nursing notes.   HISTORY  Chief Complaint Weakness    HPI Savannah Wilson is a 84 y.o. female whose past medical history includes everything below but most relevant today includes chronic kidney disease who presents for evaluation of gradually worsening generalized weakness over an extended period of time.  Reportedly for about 6 weeks she has not felt right.  She has seen her primary times and has had at least 3 rounds of antibiotics and prednisone for possible pneumonia or bronchitis.  Right she is currently not having any respiratory symptoms and has not had any increased difficulty breathing or cough recently but she says she feels so weak that she cannot do her normal activities.  She has no appetite and no interest in eating so she has not been regularly over the last few days.  She does not have any nausea, vomiting, no fever/chills, chest pain, shortness of breath, diarrhea, and dysuria.  She has been increasingly fatigued and is starting to feel lightheaded and dizzy when she stands up or moves around.  She sees Dr. [name] with the nephrology service for chronic kidney disease and she is very concerned and frightened about what may happen to her kidneys because she had multiple relatives including her daughter that died from complications of chronic kidney disease.  She lost an adult daughter almost exactly a year ago and has been struggling with some grief from that loss.  She reports that her symptoms are gradual in onset but severe.  Nothing has helped and they have been gradually getting worse over time.  Past Medical History:  Diagnosis Date  . Asthma   . Barrett esophagus   . Chronic bronchitis   . Chronic kidney disease (CKD), stage IV  (severe) ([place])   . Depression   . Diverticulosis   . GERD (gastroesophageal reflux disease)   . Gout   . Hyperlipidemia   . Hypertension   . Hypothyroid   . Mitral regurgitation    a. TTE 02/2017: EF 55-60%, normal WM, calcified mitral annulus with mod regurg, moderately dilated LA, unable to estimate PASP  . Pancreatitis due to biliary obstruction March 2010   s/p ERCP sphincterotomy, cholecystectomy  . Peripheral neuralgia   . Persistent atrial fibrillation ([place])    a. diagnosed 01/24/17; CHADS2VASc => 5 (HTN, age x 2, vascular disease with PAD and aortic plaque, female) giving her an estimated annual stroke risk of 6.7%; b. successful DCCV 03/28/2017; c. on Eliquis  . Venous insufficiency     Patient Active Problem List   Diagnosis Date Noted  . Acute renal failure (ARF) ([place]) 09/19/2017  . Bilateral pneumonia 08/14/2017  . Hospital discharge follow-up 05/06/2017  . CHF exacerbation ([place]) 04/15/2017  . Atrial fibrillation ([place]) 01/25/2017  . Grief at loss of child 01/25/2017  . Atherosclerosis of aorta ([place]) 07/26/2016  . Bronchitis 06/07/2016  . Angioedema 12/06/2015  . Routine general medical examination at a health care facility 07/25/2015  . Bilateral hand numbness 01/29/2015  . Urinary incontinence, mixed 01/29/2015  . Counseling regarding advanced directives and goals of care 01/29/2015  . Medicare annual wellness visit, subsequent 08/09/2014  . Hypothyroidism 10/06/2013  . Hiatal hernia with gastroesophageal reflux 09/24/2013  . Zenker's diverticulum 09/24/2013  . Anxiety state 07/05/2013  . Need for prophylactic  vaccination and inoculation against influenza 05/07/2013  . Allergic rhinitis 01/11/2013  . Dyspnea on exertion 01/11/2013  . Bradycardia, drug induced 08/26/2012  . Renal cysts, acquired, bilateral 08/26/2012  . Anemia in chronic kidney disease (CKD) 06/24/2012  . Neuropathy 05/22/2012  . History of pancreatitis   . Hypertension 09/15/2011  . Restless  legs syndrome (RLS) 09/15/2011  . Asthma   . Grief reaction with prolonged bereavement   . Venous insufficiency   . Barrett esophagus   . CKD (chronic kidney disease), stage IV [facility])     Past Surgical History:  Procedure Laterality Date  . ABDOMINAL HYSTERECTOMY    . CARDIOVERSION N/A 03/28/2017   Procedure: CARDIOVERSION;  Surgeon: Nelva Bush, MD;  Location: [facility];  Service: Cardiovascular;  Laterality: N/A;  . CARPAL TUNNEL RELEASE  jan 2013   [name]  . CATARACT EXTRACTION     right  . CATARACT EXTRACTION  2008  . CHOLECYSTECTOMY  02/10  . CHOLECYSTECTOMY  2010  . TONSILLECTOMY    . VENTRAL HERNIA REPAIR    . VENTRAL HERNIA REPAIR  2007  . VESICOVAGINAL FISTULA CLOSURE W/ TAH      Prior to Admission medications   Medication Sig Start Date End Date Taking? Authorizing Provider  apixaban (ELIQUIS) 2.5 MG TABS tablet Take 1 tablet (2.5 mg total) by mouth 2 (two) times daily. 01/26/17  Yes Dunn, Areta Haber, PA-C  Cholecalciferol (VITAMIN D-3) 1000 units CAPS Take 1,000 Units by mouth daily.    Yes [provider]  furosemide (LASIX) 40 MG tablet Take 1 tablet (40 mg total) by mouth daily. 08/14/17 11/12/17 Yes [name], MD  hydrALAZINE (APRESOLINE) 50 MG tablet Take 1 tablet (50 mg total) by mouth 3 (three) times daily. 08/14/17 09/19/17 Yes [name], MD  levothyroxine (SYNTHROID, LEVOTHROID) 100 MCG tablet TAKE ONE TABLET BY MOUTH EVERY DAY 09/11/17  Yes [name], MD  metoprolol tartrate (LOPRESSOR) 25 MG tablet Take 0.5 tablets (12.5 mg total) by mouth 2 (two) times daily. 08/14/17 11/12/17 Yes [name], MD  omeprazole (PRILOSEC) 40 MG capsule TAKE ONE CAPSULE BY MOUTH TWICE A DAY. BEFORE BREAKFAST AND SUPPER 08/22/17  Yes [name], MD  temazepam (RESTORIL) 15 MG capsule TAKE 1 CAPSULE AT BEDTIME AS NEEDED FOR SLEEP 05/03/17  Yes [name], MD  vitamin B-12 (CYANOCOBALAMIN) 1000 MCG tablet Take 1,000 mcg by mouth daily.   Yes [provider]  acetaminophen (TYLENOL) 650 MG CR tablet Take 650 mg by mouth every 8 (eight) hours as needed for pain.     [provider]  ALPRAZolam Duanne Moron) 0.5 MG tablet TAKE ONE AND ONE-HALF TABLETS AT [facility] NEEDED FOR ANXIETY Patient not taking: Reported on 09/19/2017 04/20/17   [name], MD  benzonatate (TESSALON) 200 MG capsule Take 1 capsule (200 mg total) by mouth 3 (three) times daily as needed for cough. Patient not taking: Reported on 09/19/2017 08/03/17   [name], MD  budesonide (PULMICORT) 0.25 MG/2ML nebulizer solution Take 2 mLs (0.25 mg total) by nebulization 2 (two) times daily. 08/15/17   [name], MD  cefUROXime (CEFTIN) 500 MG tablet Take 1 tablet (500 mg total) by mouth 2 (two) times daily with a meal. Patient not taking: Reported on 09/19/2017 08/14/17   [name], MD  chlorpheniramine-HYDROcodone (TUSSIONEX PENNKINETIC ER) 10-8 MG/5ML SUER Take 5 mLs by mouth at bedtime as needed for cough. Patient not taking: Reported on 09/19/2017 08/14/17  [name], MD  doxycycline (VIBRA-TABS) 100 MG tablet Take 1 tablet (100 mg total) by mouth 2 (two) times daily. Patient not taking: Reported on 09/19/2017 09/10/17   [name], MD  DULERA 100-5 MCG/ACT AERO USE 2 PUFFS TWICE DAILY. 08/07/17   [name], MD  fluticasone (FLONASE) 50 MCG/ACT nasal spray Place 2 sprays into the nose daily. Patient taking differently: Place 2 sprays into the nose daily as needed (For allergies.).  01/30/13   [name], MD  ipratropium-albuterol (DUONEB) 0.5-2.5 (3) MG/3ML SOLN Take 3 mLs by nebulization every 6 (six) hours as needed. 08/15/17   [name], MD  losartan (COZAAR) 50 MG tablet Take 1 tablet (50 mg total) by mouth daily. 08/14/17 09/13/17  [name], MD  nystatin (MYCOSTATIN) 100000 UNIT/ML suspension Take 5 mLs (500,000 Units total) by mouth 4 (four) times daily. Patient not taking: Reported on 09/19/2017 04/06/17   [name], MD    predniSONE (DELTASONE) 10 MG tablet 6 tablets on Day 1 , then reduce by 1 tablet daily until gone Patient not taking: Reported on 09/19/2017 08/14/17   [name], MD  predniSONE (DELTASONE) 10 MG tablet 6 tablets on Day 1 , then reduce by 1 tablet daily until gone Patient not taking: Reported on 09/19/2017 09/10/17   [name], MD  PROAIR HFA 108 (90 BASE) MCG/ACT inhaler INHALE 2 PUFFS EVERY 6 HOURS AS NEEDED FOR WHEEZING 03/13/15   [name], MD  promethazine-dextromethorphan (PROMETHAZINE-DM) 6.25-15 MG/5ML syrup Take 5 mLs by mouth 4 (four) times daily as needed for cough. Patient not taking: Reported on 09/19/2017 08/05/17   [name], MD  Spacer/Aero-Holding [facility] ADVANTAGE) MISC 1 each by Other route once. Always uses her when you're using a metered-dose inhaler. You've aromatase medicine as much, he won't have his much side effect, but you it twice as much medicine and your lungs. 06/08/15   Ahmed Prima, MD    Allergies Ace inhibitors; Benicar [olmesartan medoxomil]; Clarithromycin; Clarithromycin; Norvasc [amlodipine besylate]; Levofloxacin; and Zithromax [azithromycin dihydrate]  Family History  Problem Relation Age of Onset  . Kidney failure Mother   . Hypertension Mother   . Kidney disease Mother   . Multiple myeloma Daughter   . Cancer Daughter        multiple myeloma  . Kidney disease Daughter   . Heart disease Father   . Rheumatologic disease Father   . Kidney cancer Father     Social History Social History   Tobacco Use  . Smoking status: Never Smoker  . Smokeless tobacco: Never Used  Substance Use Topics  . Alcohol use: No    Comment: rare  . Drug use: No    Review of Systems Constitutional: No fever/chills.  General malaise and fatigue Eyes: No visual changes. ENT: No sore throat. Cardiovascular: Denies chest pain. Respiratory: Denies shortness of breath, but she has been through multiple rounds of antibiotics and  prednisone over the last 6 weeks. Gastrointestinal: Minimal appetite with decreased oral intake.  No abdominal pain.  No nausea, no vomiting.  No diarrhea.  No constipation. Genitourinary: Negative for dysuria. Musculoskeletal: Negative for neck pain.  Negative for back pain. Integumentary: Negative for rash. Neurological: Negative for headaches, focal weakness or numbness. Psychiatric:Depression, anhedonia  ____________________________________________   PHYSICAL EXAM:  VITAL SIGNS: ED Triage Vitals  Enc Vitals Group     BP 09/19/17 1630 (!) 142/56     Pulse Rate  09/19/17 1630 60     Resp 09/19/17 1630 18     Temp 09/19/17 1630 98 F (36.7 C)     Temp Source 09/19/17 1630 Oral     SpO2 09/19/17 1630 97 %     Weight 09/19/17 1647 66.2 kg (146 lb)     Height 09/19/17 1647 1.524 m (5')     Head Circumference --      Peak Flow --      Pain Score --      Pain Loc --      Pain Edu? --      Excl. in [place]? --     Constitutional: Alert and oriented.  Generally well-appearing but the patient is obviously depressed and is crying in the exam room Eyes: Conjunctivae are normal.  Head: Atraumatic. Nose: No congestion/rhinnorhea. Mouth/Throat: Mucous membranes are moist. Neck: No stridor.  No meningeal signs.   Cardiovascular: Normal rate, regular rhythm. Good peripheral circulation. Grossly normal heart sounds. Respiratory: Normal respiratory effort.  No retractions. Lungs CTAB. Gastrointestinal: Soft and nontender. No distention.  Musculoskeletal: No lower extremity tenderness nor edema. No gross deformities of extremities. Neurologic:  Normal speech and language. No gross focal neurologic deficits are appreciated.  Skin:  Skin is warm, dry and intact. No rash noted. Psychiatric: Mood and affect are depressed.  She is quick to tears during our discussion, obviously anxious about her health, and very worried about her kidney function.  Her adult daughter is at the bedside and verified  with me privately that they lost 1 of the patient's other daughters a year ago and they have all been struggling with the loss.  ____________________________________________   LABS (all labs ordered are listed, but only abnormal results are displayed)  Labs Reviewed  BASIC METABOLIC PANEL - Abnormal; Notable for the following components:      Result Value   CO2 18 (*)    Glucose, Bld 103 (*)    BUN 78 (*)    Creatinine, Ser 2.57 (*)    GFR calc non Af Amer 16 (*)    GFR calc Af Amer 18 (*)    All other components within normal limits  CBC - Abnormal; Notable for the following components:   RDW 14.6 (*)    All other components within normal limits  URINALYSIS, COMPLETE (UACMP) WITH MICROSCOPIC  INFLUENZA PANEL BY PCR (TYPE A & B)  CBG MONITORING, ED   ____________________________________________  EKG  ED ECG REPORT I, Hinda Kehr, the attending physician, personally viewed and interpreted this ECG.  Date: 09/19/2017 EKG Time: 16: 46 Rate: 57 Rhythm: Borderline sinus bradycardia QRS Axis: normal Intervals: normal ST/T Wave abnormalities: Non-specific ST segment / T-wave changes, but no evidence of acute ischemia. Narrative Interpretation: no evidence of acute ischemia   ____________________________________________  RADIOLOGY   ED MD interpretation: No imaging ordered  Official radiology report(s): No results found.  ____________________________________________   PROCEDURES  Critical Care performed: No   Procedure(s) performed:   Procedures   ____________________________________________   INITIAL IMPRESSION / ASSESSMENT AND PLAN / ED COURSE  As part of my medical decision making, I reviewed the following data within the [place] History obtained from family, Nursing notes reviewed and incorporated, Labs reviewed , EKG interpreted , Old chart reviewed, Radiograph reviewed , Discussed with nephrology, and Notes from prior ED  visits    Differential diagnosis includes, but is not limited to, depression and/or adjustment disorder which has led to decreased oral intake  and dehydration with acute on chronic renal insufficiency; infectious process such as UTI or pneumonia; viral respiratory infection such as influenza; etc.  CVA and ACS much less.  The patient does not appear acutely volume depleted on exam but her BUN and creatinine are substantially elevated baseline with a resultant decrease in her GFR.  This is concerning given her very real anxiety and concern over her kidney function and the loss of multiple family members to kidney dysfunction.  At a minimum she needs IV fluids but I suspect she would benefit from observation in the hospital with IV fluids and nephrology consult.  I have paged Dr. [name] to discuss to see if he feels this would be appropriate.  I discussed with the patient's daughter privately the fact that I think the depression is playing a very large role overall her symptoms which is led to anhedonia and anorexia and the subsequent loss of oral intake which is worsening her kidney disease.  She has no other acute infectious symptoms at this time and her vital signs are stable.  I will start with a 500 mL normal saline bolus and await word from nephrology, but even if we do not speak with the hospitalist about admission/observation and nephrology consult.  The patient is strongly in favor of this plan.  Clinical Course as of Sep 19 1922  Tue Sep 19, 2017  1837 Spoke by phone with Dr. [name] who knows the patient well.  He definitely feels that it is important to admit the patient because she is frail and at high risk for worsening kidney function, and given her drop in GFR he states that this does qualify as acute renal failure.  He agreed with my plan I will discussed the patient with the hospitalist  [CF]    Clinical Course User Index [CF] Hinda Kehr, MD     ____________________________________________  FINAL CLINICAL IMPRESSION(S) / ED DIAGNOSES  Final diagnoses:  Acute on chronic renal insufficiency  Generalized weakness  Loss of appetite  Acute renal failure, unspecified acute renal failure type ([place])     MEDICATIONS GIVEN DURING THIS VISIT:  Medications  sodium chloride 0.9 % bolus 500 mL (not administered)  megestrol (MEGACE) tablet 40 mg (not administered)  feeding supplement (ENSURE ENLIVE) (ENSURE ENLIVE) liquid 237 mL (not administered)     ED Discharge Orders    None       Note:  This document was prepared using Dragon voice recognition software and may include unintentional dictation errors.    Hinda Kehr, MD 09/19/17 1924

## 2017-09-19 NOTE — ED Notes (Signed)
ED Provider at bedside. 

## 2017-09-20 ENCOUNTER — Other Ambulatory Visit: Payer: Self-pay

## 2017-09-20 LAB — CBC
HEMATOCRIT: 27.7 % — AB (ref 35.0–47.0)
HEMOGLOBIN: 9.2 g/dL — AB (ref 12.0–16.0)
MCH: 29.7 pg (ref 26.0–34.0)
MCHC: 33.2 g/dL (ref 32.0–36.0)
MCV: 89.5 fL (ref 80.0–100.0)
Platelets: 222 10*3/uL (ref 150–440)
RBC: 3.09 MIL/uL — AB (ref 3.80–5.20)
RDW: 14.5 % (ref 11.5–14.5)
WBC: 7 10*3/uL (ref 3.6–11.0)

## 2017-09-20 LAB — BASIC METABOLIC PANEL
ANION GAP: 10 (ref 5–15)
BUN: 75 mg/dL — ABNORMAL HIGH (ref 6–20)
CALCIUM: 8.2 mg/dL — AB (ref 8.9–10.3)
CHLORIDE: 104 mmol/L (ref 101–111)
CO2: 21 mmol/L — AB (ref 22–32)
Creatinine, Ser: 2.39 mg/dL — ABNORMAL HIGH (ref 0.44–1.00)
GFR calc non Af Amer: 17 mL/min — ABNORMAL LOW (ref >60)
GFR, EST AFRICAN AMERICAN: 20 mL/min — AB (ref >60)
GLUCOSE: 105 mg/dL — AB (ref 65–99)
POTASSIUM: 3.3 mmol/L — AB (ref 3.5–5.1)
Sodium: 135 mmol/L (ref 135–145)

## 2017-09-20 MED ORDER — MEGESTROL ACETATE 400 MG/10ML PO SUSP
400.0000 mg | Freq: Every day | ORAL | Status: DC
  Administered 2017-09-21: 400 mg via ORAL
  Filled 2017-09-20: qty 10

## 2017-09-20 MED ORDER — POTASSIUM CHLORIDE 20 MEQ PO PACK
20.0000 meq | PACK | Freq: Once | ORAL | Status: AC
  Administered 2017-09-20: 20 meq via ORAL
  Filled 2017-09-20: qty 1

## 2017-09-20 MED ORDER — IPRATROPIUM-ALBUTEROL 0.5-2.5 (3) MG/3ML IN SOLN
3.0000 mL | Freq: Four times a day (QID) | RESPIRATORY_TRACT | Status: DC
  Administered 2017-09-20 – 2017-09-21 (×2): 3 mL via RESPIRATORY_TRACT
  Filled 2017-09-20 (×2): qty 3

## 2017-09-20 NOTE — Progress Notes (Signed)
[place] [facility] [place] NAME: Savannah Wilson    MR#:  [id number]  DATE OF BIRTH:  01/21/1929  SUBJECTIVE:  CHIEF COMPLAINT:  pts dizziness improved  REVIEW OF SYSTEMS:  CONSTITUTIONAL: No fever, fatigue or weakness.  EYES: No blurred or double vision.  EARS, NOSE, AND THROAT: No tinnitus or ear pain.  RESPIRATORY: No cough, shortness of breath, wheezing or hemoptysis.  CARDIOVASCULAR: No chest pain, orthopnea, edema.  GASTROINTESTINAL: No nausea, vomiting, diarrhea or abdominal pain.  GENITOURINARY: No dysuria, hematuria.  ENDOCRINE: No polyuria, nocturia,  HEMATOLOGY: No anemia, easy bruising or bleeding SKIN: No rash or lesion. MUSCULOSKELETAL: No joint pain or arthritis.   NEUROLOGIC: No tingling, numbness, weakness.  PSYCHIATRY: No anxiety or depression.   DRUG ALLERGIES:   Allergies  Allergen Reactions  . Ace Inhibitors Cough  . Benicar [Olmesartan Medoxomil]     Unsure of reaction type  . Clarithromycin Other (See Comments)    Blisters in mouth  . Clarithromycin Other (See Comments)    Unknown   . Norvasc [Amlodipine Besylate]   . Levofloxacin Rash  . Zithromax [Azithromycin Dihydrate] Rash    VITALS:  Blood pressure (!) 150/48, pulse 60, temperature 97.9 F (36.6 C), temperature source Oral, resp. rate 18, height 5' (1.524 m), weight 66.2 kg (146 lb), SpO2 97 %.  PHYSICAL EXAMINATION:  GENERAL:  84 y.o.-year-old patient lying in the bed with no acute distress.  EYES: Pupils equal, round, reactive to light and accommodation. No scleral icterus. Extraocular muscles intact.  HEENT: Head atraumatic, normocephalic. Oropharynx and nasopharynx clear.  NECK:  Supple, no jugular venous distention. No thyroid enlargement, no tenderness.  LUNGS: Normal breath sounds bilaterally, no wheezing, rales,rhonchi or crepitation. No use of accessory muscles of respiration.  CARDIOVASCULAR: S1, S2 normal. No murmurs, rubs, or gallops.   ABDOMEN: Soft, nontender, nondistended. Bowel sounds present. No organomegaly or mass.  EXTREMITIES: No pedal edema, cyanosis, or clubbing.  NEUROLOGIC: Cranial nerves II through XII are intact. Muscle strength 5/5 in all extremities. Sensation intact. Gait not checked.  PSYCHIATRIC: The patient is alert and oriented x 3.  SKIN: No obvious rash, lesion, or ulcer.    LABORATORY PANEL:   CBC Recent Labs  Lab 09/20/17 0456  WBC 7.0  HGB 9.2*  HCT 27.7*  PLT 222   ------------------------------------------------------------------------------------------------------------------  Chemistries  Recent Labs  Lab 09/20/17 0456  NA 135  K 3.3*  CL 104  CO2 21*  GLUCOSE 105*  BUN 75*  CREATININE 2.39*  CALCIUM 8.2*   ------------------------------------------------------------------------------------------------------------------  Cardiac Enzymes No results for input(s): TROPONINI in the last 168 hours. ------------------------------------------------------------------------------------------------------------------  RADIOLOGY:  No results found.  EKG:   Orders placed or performed during the hospital encounter of 09/19/17  . EKG 12-Lead  . EKG 12-Lead  . ED EKG  . ED EKG    ASSESSMENT AND PLAN:    Patient is a 84 year old white female with chronic kidney disease presenting with weakness  1.  Acute renal failure on chronic kidney disease stage IV sec to dehydration Clinically improving  IV fluids Stop Lasix Cr (9/18) 1.62 ; this admission 2.57-2.39  2.  History of atrial fibrillation Continue Eliquis and Lopressor  3.  Essential hypertension continue hydralazine and Lopressor  4.  Hypothyroidism continue Synthroid  5.  GERD continue Prilosec  6.  History of bronchospasm and asthma continue inhalers as taking at home  7.  Poor appetite start Megace  PT - hhpt  All the records are reviewed and case discussed with Care Management/Social  Workerr. Management plans discussed with the patient, family and they are in agreement.  CODE STATUS: fc  TOTAL TIME TAKING CARE OF THIS PATIENT: 58minutes.   POSSIBLE D/C IN 2 DAYS, DEPENDING ON CLINICAL CONDITION.  Note: This dictation was prepared with Dragon dictation along with smaller phrase technology. Any transcriptional errors that result from this process are unintentional.   [name] M.D on 09/20/2017 at 9:44 PM  Between 7am to 6pm - Pager - [phone] After 6pm go to [email or web address] - password [email or web address] [place] [facility]  Office  [phone]  CC: Primary care physician; [name], MD

## 2017-09-20 NOTE — Evaluation (Signed)
Physical Therapy Evaluation Patient Details Name: CHAD TIZNADO MRN: [id number] DOB: Jun 23, 1929 Today's Date: 09/20/2017   History of Present Illness  Pt is an 84 y.o. female presenting to hospital with increasing generalized weakness, lightheaded/dizzy when stands up and moves around; not eating/drinking much; since since December treated for 3 rounds of antibiotics for PNA.  Pt admitted with acute renal failure on CKD staage IV.  PMH includes hiatal hernia, CKD stage IV, htn, mitral regurgitation, persistent a-fib, cardioversion, CTR, barrett esophagus.  Clinical Impression  Prior to hospital admission, pt was recently using RW for ambulation the last week d/t weakness (but no AD prior to that).  Pt lives alone in 1 level home with 1 step to enter with R railing.  Currently pt is CGA with transfers and ambulation with RW 160 feet.  Pt demonstrates generalized weakness, decreased activity tolerance from baseline, and some SOB with distance ambulated (O2 sats >95% on room air during session).  Steady with ambulation using youth sized RW.  L LE weakness noted (compared to R LE); pt reporting long-standing h/o L LE weakness.  Pt would benefit from skilled PT to address noted impairments and functional limitations (see below for any additional details).  Upon hospital discharge, recommend pt discharge to home with HHPT and support of family.    Follow Up Recommendations Home health PT    Equipment Recommendations  Rolling walker with 5" wheels(pt has RW at home already)    Recommendations for Other Services       Precautions / Restrictions Precautions Precautions: Fall Restrictions Weight Bearing Restrictions: No      Mobility  Bed Mobility               General bed mobility comments: Deferred d/t pt sitting up in chair beginning and end of session.  Transfers Overall transfer level: Needs assistance Equipment used: Rolling walker (2 wheeled) Transfers: Sit to/from Stand Sit to  Stand: Min guard         General transfer comment: mild increased effort to stand from recliner; significant increased effort to stand from toilet with use of R grab bar; CGA provided for safety  Ambulation/Gait Ambulation/Gait assistance: Min guard Ambulation Distance (Feet): 160 Feet Assistive device: Rolling walker (2 wheeled)   Gait velocity: decreased   General Gait Details: mild decreased B step length; mild more narrow BOS; mild increased SOB with distance ambulated  Stairs            Wheelchair Mobility    Modified Rankin (Stroke Patients Only)       Balance Overall balance assessment: Needs assistance Sitting-balance support: No upper extremity supported Sitting balance-Leahy Scale: Normal Sitting balance - Comments: steady sitting reaching outside BOS   Standing balance support: Single extremity supported Standing balance-Leahy Scale: Good Standing balance comment: steady standing washing hands at sink (no support)                             Pertinent Vitals/Pain Pain Assessment: No/denies pain  Vitals (HR and O2 on room air) stable and WFL throughout treatment session.    Home Living Family/patient expects to be discharged to:: Private residence Living Arrangements: Alone Available Help at Discharge: Family Type of Home: House Home Access: Stairs to enter Entrance Stairs-Rails: Right Entrance Stairs-Number of Steps: 1 Home Layout: One level Home Equipment: [place] - 2 wheels;Cane - quad;Shower seat;Grab bars - tub/shower;Grab bars - toilet(3ww)      Prior Function  Level of Independence: Independent with assistive device(s)         Comments: (+) driving; does own grocery shopping; pt reports no falls in past 6 months; using RW for past week d/t weakness but prior to that not using any AD.     Hand Dominance        Extremity/Trunk Assessment   Upper Extremity Assessment Upper Extremity Assessment: Generalized weakness     Lower Extremity Assessment Lower Extremity Assessment: Generalized weakness;LLE deficits/detail;RLE deficits/detail RLE Deficits / Details: 4+/5 hip flexion, knee flexion/extension, and DF LLE Deficits / Details: hip flexion 4-/5, knee flexion 4-/5, knee extension 4/5, DF 4+/5 (pt reports h/o L sided weakness for a long time)    Cervical / Trunk Assessment Cervical / Trunk Assessment: Normal  Communication   Communication: No difficulties  Cognition Arousal/Alertness: Awake/alert Behavior During Therapy: WFL for tasks assessed/performed Overall Cognitive Status: Within Functional Limits for tasks assessed                                        General Comments General comments (skin integrity, edema, etc.): Pt sitting in chair upon PT arrival.  Nursing cleared pt for participation in physical therapy.  Pt agreeable to PT session.    Exercises     Assessment/Plan    PT Assessment Patient needs continued PT services  PT Problem List Decreased strength;Decreased activity tolerance;Decreased balance;Decreased mobility       PT Treatment Interventions DME instruction;Gait training;Stair training;Functional mobility training;Therapeutic activities;Therapeutic exercise;Balance training;Patient/family education    PT Goals (Current goals can be found in the Care Plan section)  Acute Rehab PT Goals Patient Stated Goal: to go home and get stronger PT Goal Formulation: With patient Time For Goal Achievement: 10/04/17 Potential to Achieve Goals: Good    Frequency Min 2X/week   Barriers to discharge        Co-evaluation               AM-PAC PT "6 Clicks" Daily Activity  Outcome Measure Difficulty turning over in bed (including adjusting bedclothes, sheets and blankets)?: A Little Difficulty moving from lying on back to sitting on the side of the bed? : A Little Difficulty sitting down on and standing up from a chair with arms (e.g., wheelchair, bedside  commode, etc,.)?: Unable Help needed moving to and from a bed to chair (including a wheelchair)?: A Little Help needed walking in hospital room?: A Little Help needed climbing 3-5 steps with a railing? : A Little 6 Click Score: 16    End of Session Equipment Utilized During Treatment: Gait belt Activity Tolerance: Patient limited by fatigue Patient left: in chair;with call bell/phone within reach;with chair alarm set;with family/visitor present Nurse Communication: Mobility status;Precautions PT Visit Diagnosis: Other abnormalities of gait and mobility (R26.89);Muscle weakness (generalized) (M62.81)    Time: 7793-9030 PT Time Calculation (min) (ACUTE ONLY): 32 min   Charges:   PT Evaluation $PT Eval Low Complexity: 1 Low PT Treatments $Therapeutic Activity: 8-22 mins   PT G CodesLeitha Bleak, PT 09/20/17, 12:12 PM [phone]

## 2017-09-20 NOTE — Care Management (Signed)
Patient independent from home. Met with her and daughter. Patient denies need for home health PT.

## 2017-09-21 LAB — BASIC METABOLIC PANEL
Anion gap: 9 (ref 5–15)
BUN: 69 mg/dL — AB (ref 6–20)
CHLORIDE: 110 mmol/L (ref 101–111)
CO2: 21 mmol/L — ABNORMAL LOW (ref 22–32)
CREATININE: 2.16 mg/dL — AB (ref 0.44–1.00)
Calcium: 8.6 mg/dL — ABNORMAL LOW (ref 8.9–10.3)
GFR calc Af Amer: 23 mL/min — ABNORMAL LOW (ref >60)
GFR calc non Af Amer: 20 mL/min — ABNORMAL LOW (ref >60)
GLUCOSE: 127 mg/dL — AB (ref 65–99)
POTASSIUM: 3.9 mmol/L (ref 3.5–5.1)
SODIUM: 140 mmol/L (ref 135–145)

## 2017-09-21 LAB — MAGNESIUM: MAGNESIUM: 1.7 mg/dL (ref 1.7–2.4)

## 2017-09-21 MED ORDER — IPRATROPIUM-ALBUTEROL 0.5-2.5 (3) MG/3ML IN SOLN: 3.0000 mL | Freq: Two times a day (BID) | RESPIRATORY_TRACT | Status: DC

## 2017-09-21 MED ORDER — BUDESONIDE 0.25 MG/2ML IN SUSP
0.2500 mg | Freq: Two times a day (BID) | RESPIRATORY_TRACT | Status: DC
  Administered 2017-09-21: 0.25 mg via RESPIRATORY_TRACT
  Filled 2017-09-21: qty 2

## 2017-09-21 NOTE — Discharge Instructions (Signed)
[facility] [place] [facility] ° °DIET:  °Cardiac diet ° °DISCHARGE CONDITION:  °Stable ° °ACTIVITY:  °Activity as tolerated ° °OXYGEN:  °Home Oxygen: No. °  °Oxygen Delivery: room air ° °DISCHARGE LOCATION:  °home  ° ° °ADDITIONAL DISCHARGE INSTRUCTION: ° ° °If you experience worsening of your admission symptoms, develop shortness of breath, life threatening emergency, suicidal or homicidal thoughts you must seek medical attention immediately by calling 911 or calling your MD immediately  if symptoms less severe. ° °You Must read complete instructions/literature along with all the possible adverse reactions/side effects for all the Medicines you take and that have been prescribed to you. Take any new Medicines after you have completely understood and accpet all the possible adverse reactions/side effects.  ° °Please note ° °You were cared for by a hospitalist during your hospital stay. If you have any questions about your discharge medications or the care you received while you were in the hospital after you are discharged, you can call the unit and asked to speak with the hospitalist on call if the hospitalist that took care of you is not available. Once you are discharged, your primary care physician will handle any further medical issues. Please note that NO REFILLS for any discharge medications will be authorized once you are discharged, as it is imperative that you return to your primary care physician (or establish a relationship with a primary care physician if you do not have one) for your aftercare needs so that they can reassess your need for medications and monitor your lab values. ° ° °

## 2017-09-21 NOTE — Discharge Summary (Signed)
[facility] [place] [facility] [employer], 84 y.o., DOB Oct 19, 1928, MRN [id number]. Admission date: 09/19/2017 Discharge Date 09/21/2017 Primary MD [name], MD Admitting Physician Dustin Flock, MD  Admission Diagnosis  Loss of appetite [R63.0] Acute on chronic renal insufficiency [N28.9, N18.9] Generalized weakness [R53.1] Acute renal failure, unspecified acute renal failure type [facility]) [N17.9]  Discharge Diagnosis   Active Problems:   Acute renal failure (ARF) ([place]) on chronic kidney disease stage IV History of atrial fibrillation Essential hypertension Hypothyroidism  history of asthma ERD        Hospital Course patient is a 84 year old white female with chronic kidney disease presented to the hospital with complaint of generalized weakness and dizziness.  Patient was noted to have acute renal failure.  She is on chronic Lasix which was held.  With this intervention and IV fluids her renal function normalized.  She is doing much better and feeling much better she was seen by PT who recommended home PT however patient did not want home PT            Consults none  Significant Tests:  See full reports for all details    Dg Chest 2 View  Result Date: 09/11/2017 CLINICAL DATA:  One month of productive cough. Episode of pneumonia over Christmas 2018. Patient reports mild shortness of breath. No chest pain, fever, or nausea or vomiting. EXAM: CHEST  2 VIEW COMPARISON:  PA and lateral chest x-ray of August 14, 2017. FINDINGS: The lungs are adequately inflated. There is stable mild blunting of the right lateral and posterior costophrenic angles. There is no alveolar pneumonia. The interstitial markings of both lungs are coarse though stable. The heart and pulmonary vascularity are normal. There calcification in the wall of the aortic arch. There is a moderate-sized hiatal hernia. There is multilevel degenerative disc disease of the lower thoracic  spine. IMPRESSION: Chronic bronchitic changes.  No acute pneumonia nor CHF. Moderate-sized hiatal hernia. Thoracic aortic atherosclerosis. Electronically Signed   By: David  Martinique M.D.   On: 09/11/2017 13:17       Today   Subjective:   [name] patient feeling much better wants to go home  Objective:   Blood pressure (!) 148/50, pulse (!) 55, temperature 97.8 F (36.6 C), temperature source Oral, resp. rate 16, height 5' (1.524 m), weight 146 lb (66.2 kg), SpO2 97 %.  .  Intake/Output Summary (Last 24 hours) at 09/21/2017 1841 Last data filed at 09/21/2017 1030 Gross per 24 hour  Intake 2280 ml  Output -  Net 2280 ml    Exam VITAL SIGNS: Blood pressure (!) 148/50, pulse (!) 55, temperature 97.8 F (36.6 C), temperature source Oral, resp. rate 16, height 5' (1.524 m), weight 146 lb (66.2 kg), SpO2 97 %.  GENERAL:  84 y.o.-year-old patient lying in the bed with no acute distress.  EYES: Pupils equal, round, reactive to light and accommodation. No scleral icterus. Extraocular muscles intact.  HEENT: Head atraumatic, normocephalic. Oropharynx and nasopharynx clear.  NECK:  Supple, no jugular venous distention. No thyroid enlargement, no tenderness.  LUNGS: Normal breath sounds bilaterally, no wheezing, rales,rhonchi or crepitation. No use of accessory muscles of respiration.  CARDIOVASCULAR: S1, S2 normal. No murmurs, rubs, or gallops.  ABDOMEN: Soft, nontender, nondistended. Bowel sounds present. No organomegaly or mass.  EXTREMITIES: No pedal edema, cyanosis, or clubbing.  NEUROLOGIC: Cranial nerves II through XII are intact. Muscle strength 5/5 in all extremities. Sensation intact. Gait not checked.  PSYCHIATRIC: The patient is alert and oriented x 3.  SKIN: No obvious rash, lesion, or ulcer.   Data Review     CBC w Diff:  Lab Results  Component Value Date   WBC 7.0 09/20/2017   HGB 9.2 (L) 09/20/2017   HGB 10.1 (L) 04/04/2017   HCT 27.7 (L) 09/20/2017   HCT 32.0  (L) 04/04/2017   PLT 222 09/20/2017   PLT 230 04/04/2017   LYMPHOPCT 23 04/15/2017   LYMPHOPCT 13.4 08/23/2012   MONOPCT 8 04/15/2017   MONOPCT 7.8 08/23/2012   EOSPCT 3 04/15/2017   EOSPCT 1.3 08/23/2012   BASOPCT 1 04/15/2017   BASOPCT 1.0 08/23/2012   CMP:  Lab Results  Component Value Date   NA 140 09/21/2017   NA 141 04/04/2017   NA 135 (L) 08/23/2012   K 3.9 09/21/2017   K 4.7 08/23/2012   CL 110 09/21/2017   CL 102 08/23/2012   CO2 21 (L) 09/21/2017   CO2 22 08/23/2012   BUN 69 (H) 09/21/2017   BUN 31 (H) 04/04/2017   BUN 22 (H) 08/23/2012   CREATININE 2.16 (H) 09/21/2017   CREATININE 1.52 (H) 08/23/2012   CREATININE 2.69 (H) 03/16/2012   PROT 6.7 08/14/2017   PROT 6.6 01/26/2017   PROT 7.1 08/23/2012   ALBUMIN 3.5 08/14/2017   ALBUMIN 4.2 01/26/2017   ALBUMIN 3.4 08/23/2012   BILITOT 0.5 08/14/2017   BILITOT 0.9 01/26/2017   BILITOT 1.1 (H) 08/23/2012   ALKPHOS 71 08/14/2017   ALKPHOS 158 (H) 08/23/2012   AST 13 08/14/2017   AST 34 08/23/2012   ALT 9 08/14/2017   ALT 13 08/23/2012  .  Micro Results No results found for this or any previous visit (from the past 240 hour(s)).   Code Status History    Date Active Date Inactive Code Status Order ID Comments User Context   09/19/2017 22:57 09/21/2017 16:33 Full Code [id number]  Dustin Flock, MD Inpatient   04/15/2017 15:25 04/19/2017 19:27 Full Code [id number]  Gladstone Lighter, MD Inpatient    Advance Directive Documentation     Most Recent Value  Type of Advance Directive  Healthcare Power of Attorney, Living will  Pre-existing out of facility DNR order (yellow form or pink MOST form)  No data  "MOST" Form in Place?  No data          Follow-up Information    [name], MD On 10/13/2017.   Specialty:  Internal Medicine Why:  hospital f/u;  (NOTE:  Arrive @ 10:45 in Office)  @ 11:00 am Contact information: [address] [place] [address] [phone]         Lavonia Dana, MD In 1 week.   Specialty:  Internal Medicine Why:  f/u acute renal failure;  Physician's office will call Patient w/ Appt Contact information: [address] [place] [address] [phone]           Discharge Medications   Allergies as of 09/21/2017      Reactions   Ace Inhibitors Cough   Benicar [olmesartan Medoxomil]    Unsure of reaction type   Clarithromycin Other (See Comments)   Blisters in mouth   Clarithromycin Other (See Comments)   Unknown    Norvasc [amlodipine Besylate]    Levofloxacin Rash   Zithromax [azithromycin Dihydrate] Rash      Medication List    STOP taking these medications   cefUROXime 500 MG tablet Commonly known as:  CEFTIN   doxycycline 100 MG tablet Commonly known as:  VIBRA-TABS   furosemide 40 MG tablet Commonly known as:  LASIX   predniSONE 10 MG tablet Commonly known as:  DELTASONE     TAKE these medications   acetaminophen 650 MG CR tablet Commonly known as:  TYLENOL Take 650 mg by mouth every 8 (eight) hours as needed for pain.   ALPRAZolam 0.5 MG tablet Commonly known as:  XANAX TAKE ONE AND ONE-HALF TABLETS AT BEDTIMEAS NEEDED FOR ANXIETY   apixaban 2.5 MG Tabs tablet Commonly known as:  ELIQUIS Take 1 tablet (2.5 mg total) by mouth 2 (two) times daily.   benzonatate 200 MG capsule Commonly known as:  TESSALON Take 1 capsule (200 mg total) by mouth 3 (three) times daily as needed for cough.   budesonide 0.25 MG/2ML nebulizer solution Commonly known as:  PULMICORT Take 2 mLs (0.25 mg total) by nebulization 2 (two) times daily.   chlorpheniramine-HYDROcodone 10-8 MG/5ML Suer Commonly known as:  TUSSIONEX PENNKINETIC ER Take 5 mLs by mouth at bedtime as needed for cough.   DULERA 100-5 MCG/ACT Aero Generic drug:  mometasone-formoterol USE 2 PUFFS TWICE DAILY.   fluticasone 50 MCG/ACT nasal spray Commonly known as:  FLONASE Place 2 sprays into the nose daily. What  changed:    when to take this  reasons to take this   hydrALAZINE 50 MG tablet Commonly known as:  APRESOLINE Take 1 tablet (50 mg total) by mouth 3 (three) times daily.   ipratropium-albuterol 0.5-2.5 (3) MG/3ML Soln Commonly known as:  DUONEB Take 3 mLs by nebulization every 6 (six) hours as needed.   levothyroxine 100 MCG tablet Commonly known as:  SYNTHROID, LEVOTHROID TAKE ONE TABLET BY MOUTH EVERY DAY   losartan 50 MG tablet Commonly known as:  COZAAR Take 1 tablet (50 mg total) by mouth daily.   metoprolol tartrate 25 MG tablet Commonly known as:  LOPRESSOR Take 0.5 tablets (12.5 mg total) by mouth 2 (two) times daily.   nystatin 100000 UNIT/ML suspension Commonly known as:  MYCOSTATIN Take 5 mLs (500,000 Units total) by mouth 4 (four) times daily.   omeprazole 40 MG capsule Commonly known as:  PRILOSEC TAKE ONE CAPSULE BY MOUTH TWICE A DAY. BEFORE BREAKFAST AND SUPPER   OPTICHAMBER ADVANTAGE Misc 1 each by Other route once. Always uses her when you're using a metered-dose inhaler. You've aromatase medicine as much, he won't have his much side effect, but you it twice as much medicine and your lungs.   PROAIR HFA 108 (90 Base) MCG/ACT inhaler Generic drug:  albuterol INHALE 2 PUFFS EVERY 6 HOURS AS NEEDED FOR WHEEZING   promethazine-dextromethorphan 6.25-15 MG/5ML syrup Commonly known as:  PROMETHAZINE-DM Take 5 mLs by mouth 4 (four) times daily as needed for cough.   temazepam 15 MG capsule Commonly known as:  RESTORIL TAKE 1 CAPSULE AT BEDTIME AS NEEDED FOR SLEEP   vitamin B-12 1000 MCG tablet Commonly known as:  CYANOCOBALAMIN Take 1,000 mcg by mouth daily.   Vitamin D-3 1000 units Caps Take 1,000 Units by mouth daily.          Total Time in preparing paper work, data evaluation and todays exam - 80 minutes  Dustin Flock M.D on 09/21/2017 at 6:41 PM  [facility]   Office  [phone]

## 2017-09-21 NOTE — Progress Notes (Signed)
Initial Nutrition Assessment  DOCUMENTATION CODES:   Not applicable  INTERVENTION:   Continue to provide Ensure Enlive po BID, each supplement provides 350kcals and 20g of protein.  NUTRITION DIAGNOSIS:   Inadequate oral intake related to poor appetite as evidenced by per patient/family report.  GOAL:   Patient will meet greater than or equal to 90% of their needs  MONITOR:   PO intake  REASON FOR ASSESSMENT:   Malnutrition Screening Tool    ASSESSMENT:   84 y.o. female with PMH of CKD, HTN, hypothyroidism, GERD, Barret's esophagus, pancreatitis with cholecystectomy. Pt has been treated with 3 rounds of antibiotics as well as prednisone for PNA since December. Presented to the ED with generalized weakness and found to have ARF.   Pt reports having a poor appetite since December when she had PNA. She states only eating bites/snacks and some sweets over the course of the day rather than meals. At time of visit, pt reported her appetite was returning.   Pt reports not consuming breakfast yesterday due to taste preferences and did not consume lunch yesterday because Ensure filled her up right before mealtime. Intern explained to pt that she can consume Ensure farther away from meal times or save it for a later time. Pt interested in trying Magic Cup but pt will soon by D/C. Will not order.  Pt reports consuming 75% of dinner last night and >50% of breakfast this morning which was confirmed by observation. Noted several sweets/cookies brought from home by pt's bedside.   Pt reports no recent wt loss. Per chart review, pt's wt is stable.   Medications: Vitamin D, levothyroxine, megace, protonix.   Labs: BUN (H; 69), Cr (H; 2.16), Ca (L; 8.6)  NUTRITION - FOCUSED PHYSICAL EXAM:    Most Recent Value  Orbital Region  No depletion  Upper Arm Region  No depletion  Thoracic and Lumbar Region  No depletion  Buccal Region  No depletion  Temple Region  Mild depletion  Clavicle  Bone Region  No depletion  Clavicle and Acromion Bone Region  Mild depletion  Scapular Bone Region  Mild depletion  Dorsal Hand  Mild depletion  Patellar Region  Moderate depletion  Anterior Thigh Region  Moderate depletion  Posterior Calf Region  Moderate depletion  Hair  Reviewed  Eyes  Reviewed  Mouth  Reviewed  Skin  Reviewed     Mild-moderate muscle depletion appears to be due to advanced age. Pt also utilizes a walker at times.   Diet Order:  Diet Heart Room service appropriate? Yes; Fluid consistency: Thin  EDUCATION NEEDS:   No education needs have been identified at this time  Skin:  Skin Assessment: Reviewed RN Assessment  Last BM:  2/13  Height:   Ht Readings from Last 1 Encounters:  09/19/17 5' (1.524 m)    Weight:  Wt Readings from Last 3 Encounters:  09/19/17 146 lb (66.2 kg)  08/14/17 146 lb 3.2 oz (66.3 kg)  06/27/17 144 lb (65.3 kg)  04/24/17 144lb 8oz   Ideal Body Weight:  45.45 kg  BMI:  Body mass index is 28.51 kg/m.  Estimated Nutritional Needs:   Kcal:  1200-1400  Protein:  70-80  Fluid:  1.2-1.4L    Vartan Kerins, MS, Dietetic Intern Pager # [phone]

## 2017-09-21 NOTE — Discharge Planning (Signed)
Patient IV removed.  RN assessment and VS revealed stability for DC to home.  Discharge papers printed, given, explained and educated.  Informed of suggested FU appt and appt made.  Informed of meds to STOP taking - no scripts needed at this time.  Once ready, will be wheeled to front and family transporting home.  Waiting on ride to arrive.

## 2017-09-21 NOTE — Plan of Care (Signed)
  Progressing Education: Knowledge of General Education information will improve 09/21/2017 0625 - Progressing by Rolland Bimler, [place] Behavior/Discharge Planning: Ability to manage health-related needs will improve 09/21/2017 0625 - Progressing by Rolland Bimler, RN Clinical Measurements: Ability to maintain clinical measurements within normal limits will improve 09/21/2017 0625 - Progressing by Rolland Bimler, RN Will remain free from infection 09/21/2017 0625 - Progressing by Rolland Bimler, RN Diagnostic test results will improve 09/21/2017 0625 - Progressing by Rolland Bimler, RN Respiratory complications will improve 09/21/2017 0625 - Progressing by Rolland Bimler, RN Cardiovascular complication will be avoided 09/21/2017 0625 - Progressing by Rolland Bimler, RN Activity: Risk for activity intolerance will decrease 09/21/2017 0625 - Progressing by Rolland Bimler, RN Nutrition: Adequate nutrition will be maintained 09/21/2017 0625 - Progressing by Rolland Bimler, RN Coping: Level of anxiety will decrease 09/21/2017 0625 - Progressing by Rolland Bimler, RN Elimination: Will not experience complications related to bowel motility 09/21/2017 0625 - Progressing by Rolland Bimler, RN Will not experience complications related to urinary retention 09/21/2017 0625 - Progressing by Rolland Bimler, RN Pain Managment: General experience of comfort will improve 09/21/2017 0625 - Progressing by Rolland Bimler, RN Safety: Ability to remain free from injury will improve 09/21/2017 0625 - Progressing by Rolland Bimler, RN Skin Integrity: Risk for impaired skin integrity will decrease 09/21/2017 0625 - Progressing by Rolland Bimler, RN Education: Knowledge of General Education information will improve 09/21/2017 [phone] - Progressing by Rolland Bimler, [place]  Behavior/Discharge Planning: Ability to manage health-related needs will improve 09/21/2017 0625 - Progressing by Rolland Bimler, RN Clinical Measurements: Ability to maintain clinical measurements within normal limits will improve 09/21/2017 0625 - Progressing by Rolland Bimler, RN Will remain free from infection 09/21/2017 0625 - Progressing by Rolland Bimler, RN Diagnostic test results will improve 09/21/2017 0625 - Progressing by Rolland Bimler, RN Respiratory complications will improve 09/21/2017 0625 - Progressing by Rolland Bimler, RN Cardiovascular complication will be avoided 09/21/2017 0625 - Progressing by Rolland Bimler, RN Activity: Risk for activity intolerance will decrease 09/21/2017 0625 - Progressing by Rolland Bimler, RN Nutrition: Adequate nutrition will be maintained 09/21/2017 0625 - Progressing by Rolland Bimler, RN Coping: Level of anxiety will decrease 09/21/2017 0625 - Progressing by Rolland Bimler, RN Elimination: Will not experience complications related to bowel motility 09/21/2017 0625 - Progressing by Rolland Bimler, RN Will not experience complications related to urinary retention 09/21/2017 0625 - Progressing by Rolland Bimler, RN Pain Managment: General experience of comfort will improve 09/21/2017 0625 - Progressing by Rolland Bimler, RN Safety: Ability to remain free from injury will improve 09/21/2017 0625 - Progressing by Rolland Bimler, RN Skin Integrity: Risk for impaired skin integrity will decrease 09/21/2017 0625 - Progressing by Rolland Bimler, RN

## 2017-09-22 ENCOUNTER — Telehealth: Payer: Self-pay | Admitting: Cardiovascular Disease

## 2017-09-22 NOTE — Telephone Encounter (Signed)
S/w pt who was discharged yesterday from [facility] after being admitted for dehydrations/AKI. Lasix was discontinued and pt received IVF. She has f/u w/nephrology on 2/18. She is concerned that without lasix, she will being to retain fluid. We reviewed importance of hydrating as instructed at discharge as kidney function has improved, as well as tracking daily weights. She understands to call for weight gain >2lbs/day or 5lbs in a week. She will bring copy of daily weights to 2/22 appt with Dr. Fletcher Anon.  At patient request, I have added her to the wait list in case of 2/19 opening.  Pt is appreciative with no further questions at this time.

## 2017-09-22 NOTE — Telephone Encounter (Signed)
Patient has recently been in hospital Is aware of appt with Dr Fletcher Anon on 2/22  Would like to discuss some things with Jessica Priest Please call

## 2017-09-23 ENCOUNTER — Other Ambulatory Visit: Payer: Self-pay | Admitting: Internal Medicine

## 2017-09-23 MED ORDER — ESCITALOPRAM OXALATE 10 MG PO TABS
10.0000 mg | ORAL_TABLET | Freq: Every day | ORAL | 2 refills | Status: DC
Start: 2017-09-23 — End: 2018-01-29

## 2017-09-25 ENCOUNTER — Telehealth: Payer: Self-pay | Admitting: Internal Medicine

## 2017-09-25 NOTE — Telephone Encounter (Signed)
Patient notified and voiced understanding.

## 2017-09-25 NOTE — Telephone Encounter (Signed)
She can take 16furosemide tablet every 3 days,  NOT daily

## 2017-09-25 NOTE — Telephone Encounter (Signed)
Transition Care Management Follow-up Telephone Call  How have you been since you were released from the hospital? Feeling well except for swelling in legs , atken off furosemide in hospital and now my legs are swelling .   Do you understand why you were in the hospital? yes   Do you understand the discharge instrcutions? yes  Items Reviewed:  Medications reviewed: yes  Allergies reviewed: yes  Dietary changes reviewed: yes  Referrals reviewed: yes   Functional Questionnaire:   Activities of Daily Living (ADLs):   She states they are independent in the following: ambulation, bathing and hygiene, feeding, continence, grooming, toileting and dressing States they require assistance with the following: No required at this time.   Any transportation issues/concerns?: no   Any patient concerns? Yes, bilateral extremity swelling appointment with PCP scheduled for 09/28/17.   Confirmed importance and date/time of follow-up visits scheduled: yes   Confirmed with patient if condition begins to worsen call PCP or go to the ER.  Patient was given the Call-a-Nurse line [phone]: yes

## 2017-09-27 DIAGNOSIS — N2581 Secondary hyperparathyroidism of renal origin: Secondary | ICD-10-CM | POA: Diagnosis not present

## 2017-09-27 DIAGNOSIS — R600 Localized edema: Secondary | ICD-10-CM | POA: Diagnosis not present

## 2017-09-27 DIAGNOSIS — I10 Essential (primary) hypertension: Secondary | ICD-10-CM | POA: Diagnosis not present

## 2017-09-27 DIAGNOSIS — N184 Chronic kidney disease, stage 4 (severe): Secondary | ICD-10-CM | POA: Diagnosis not present

## 2017-09-27 LAB — BASIC METABOLIC PANEL
BUN: 69 — AB (ref 4–21)
CREATININE: 2.2 — AB (ref 0.5–1.1)
GLUCOSE: 127
POTASSIUM: 3.9 (ref 3.4–5.3)
SODIUM: 140 (ref 137–147)

## 2017-09-28 ENCOUNTER — Encounter: Payer: Self-pay | Admitting: Internal Medicine

## 2017-09-28 ENCOUNTER — Ambulatory Visit (INDEPENDENT_AMBULATORY_CARE_PROVIDER_SITE_OTHER): Payer: PPO | Admitting: Internal Medicine

## 2017-09-28 VITALS — BP 146/66 | HR 59 | Temp 97.8°F | Resp 17 | Ht 60.0 in | Wt 146.0 lb

## 2017-09-28 DIAGNOSIS — I872 Venous insufficiency (chronic) (peripheral): Secondary | ICD-10-CM

## 2017-09-28 DIAGNOSIS — F411 Generalized anxiety disorder: Secondary | ICD-10-CM

## 2017-09-28 DIAGNOSIS — N184 Chronic kidney disease, stage 4 (severe): Secondary | ICD-10-CM

## 2017-09-28 DIAGNOSIS — D631 Anemia in chronic kidney disease: Secondary | ICD-10-CM

## 2017-09-28 DIAGNOSIS — E876 Hypokalemia: Secondary | ICD-10-CM

## 2017-09-28 DIAGNOSIS — F321 Major depressive disorder, single episode, moderate: Secondary | ICD-10-CM

## 2017-09-28 DIAGNOSIS — Z09 Encounter for follow-up examination after completed treatment for conditions other than malignant neoplasm: Secondary | ICD-10-CM

## 2017-09-28 DIAGNOSIS — I10 Essential (primary) hypertension: Secondary | ICD-10-CM | POA: Diagnosis not present

## 2017-09-28 DIAGNOSIS — N183 Chronic kidney disease, stage 3 (moderate): Secondary | ICD-10-CM

## 2017-09-28 MED ORDER — METOPROLOL TARTRATE 25 MG PO TABS: 12.5000 mg | ORAL_TABLET | Freq: Two times a day (BID) | ORAL | 3 refills | Status: DC

## 2017-09-28 MED ORDER — LEVOTHYROXINE SODIUM 100 MCG PO TABS: 100.0000 ug | ORAL_TABLET | Freq: Every day | ORAL | 0 refills | Status: DC

## 2017-09-28 MED ORDER — HYDRALAZINE HCL 50 MG PO TABS: 50.0000 mg | ORAL_TABLET | Freq: Three times a day (TID) | ORAL | 3 refills | Status: DC

## 2017-09-28 NOTE — Patient Instructions (Addendum)
Reduce the alprazolam to 1/2 tablet daily if needed for anxiety  The lexapro should be taken every day in the evening .  Start with 1/2 tablet daily and increase to full tablet after a week if not nauseated from it.   You need to take 20 mEq of potassium daily until Monday ,  Then take it every other day until you have labs done

## 2017-09-28 NOTE — Progress Notes (Signed)
Subjective:  Patient ID: Savannah Wilson, female    DOB: 11/03/1928  Age: 84 y.o. MRN: [id number]  CC: The primary encounter diagnosis was Hypokalemia. Diagnoses of Depression, major, single episode, moderate ([place]), Anemia in stage 3 chronic kidney disease ([place]), Venous insufficiency, CKD (chronic kidney disease), stage IV ([place]), Essential hypertension, Anxiety state, and Hospital discharge follow-up were also pertinent to this visit.  HPI Savannah Wilson presents for hospital follow up.  Patient was admitted to [facility] on Feb 12 with dehydration,  Acute on chronic renal failure and suspicion of depression. Lung exam was normal  .  Lasix was stopped and she was given IV fluids .  She was also started on Megace for poor appetite but she  Did not fill the prescription  At discharge . Marland Kitchen   She was discharged home on Feb 14  In improved condition. Home PT was advised but she deferred.   Eating better,  bowels moving better.  Drinking 2 ensures daily    In the interim.  Low dose alprazolam was resumed for daytime use due to increasingly labile mood noticed by family.  Feedback has been that the medication has helped her relax. She lives alone    Emotional stressors:  Her oldest daughter Marisa Cyphers died last year and her beloved dog of 14 years was euthanized several months ago.  She has another duaghter who is extremely supportive, Tammy,  who checks on her daily,  And she has adopted a young dog   Takes restoril chronically for insomnia.  Stopped sertraline over 3 years ago .  lexapro was sent in on Feb 16th   Dr Candiss Norse resumed her furosemide 40 mg bid  Until Sunday,  Then dose is lowered to once daily thereafter      Outpatient Medications Prior to Visit  Medication Sig Dispense Refill  . acetaminophen (TYLENOL) 650 MG CR tablet Take 650 mg by mouth every 8 (eight) hours as needed for pain.     Marland Kitchen ALPRAZolam (XANAX) 0.5 MG tablet TAKE ONE AND ONE-HALF TABLETS AT BEDTIMEAS NEEDED FOR ANXIETY 30  tablet 1  . apixaban (ELIQUIS) 2.5 MG TABS tablet Take 1 tablet (2.5 mg total) by mouth 2 (two) times daily. 180 tablet 3  . benzonatate (TESSALON) 200 MG capsule Take 1 capsule (200 mg total) by mouth 3 (three) times daily as needed for cough. 60 capsule 1  . budesonide (PULMICORT) 0.25 MG/2ML nebulizer solution Take 2 mLs (0.25 mg total) by nebulization 2 (two) times daily. 60 mL 12  . Cholecalciferol (VITAMIN D-3) 1000 units CAPS Take 1,000 Units by mouth daily.     Marland Kitchen escitalopram (LEXAPRO) 10 MG tablet Take 1 tablet (10 mg total) by mouth daily. 30 tablet 2  . fluticasone (FLONASE) 50 MCG/ACT nasal spray Place 2 sprays into the nose daily. (Patient taking differently: Place 2 sprays into the nose daily as needed (For allergies.). ) 16 g 6  . ipratropium-albuterol (DUONEB) 0.5-2.5 (3) MG/3ML SOLN Take 3 mLs by nebulization every 6 (six) hours as needed. 360 mL 0  . losartan (COZAAR) 50 MG tablet Take 1 tablet (50 mg total) by mouth daily. 90 tablet 1  . omeprazole (PRILOSEC) 40 MG capsule TAKE ONE CAPSULE BY MOUTH TWICE A DAY. BEFORE BREAKFAST AND SUPPER 180 capsule 1  . PROAIR HFA 108 (90 BASE) MCG/ACT inhaler INHALE 2 PUFFS EVERY 6 HOURS AS NEEDED FOR WHEEZING 8.5 g 3  . Spacer/Aero-Holding Chambers (OPTICHAMBER ADVANTAGE) MISC 1 each by Other  route once. Always uses her when you're using a metered-dose inhaler. You've aromatase medicine as much, he won't have his much side effect, but you it twice as much medicine and your lungs. 1 each 0  . temazepam (RESTORIL) 15 MG capsule TAKE 1 CAPSULE AT BEDTIME AS NEEDED FOR SLEEP 30 capsule 2  . vitamin B-12 (CYANOCOBALAMIN) 1000 MCG tablet Take 1,000 mcg by mouth daily.    . chlorpheniramine-HYDROcodone (TUSSIONEX PENNKINETIC ER) 10-8 MG/5ML SUER Take 5 mLs by mouth at bedtime as needed for cough. (Patient not taking: Reported on 09/19/2017) 140 mL 0  . DULERA 100-5 MCG/ACT AERO USE 2 PUFFS TWICE DAILY. 13 g 3  . hydrALAZINE (APRESOLINE) 50 MG tablet  Take 1 tablet (50 mg total) by mouth 3 (three) times daily. 270 tablet 3  . levothyroxine (SYNTHROID, LEVOTHROID) 100 MCG tablet TAKE ONE TABLET BY MOUTH EVERY DAY 90 tablet 0  . metoprolol tartrate (LOPRESSOR) 25 MG tablet Take 0.5 tablets (12.5 mg total) by mouth 2 (two) times daily. 180 tablet 3  . nystatin (MYCOSTATIN) 100000 UNIT/ML suspension Take 5 mLs (500,000 Units total) by mouth 4 (four) times daily. (Patient not taking: Reported on 09/19/2017) 60 mL 0  . promethazine-dextromethorphan (PROMETHAZINE-DM) 6.25-15 MG/5ML syrup Take 5 mLs by mouth 4 (four) times daily as needed for cough. (Patient not taking: Reported on 09/19/2017) 180 mL 0   No facility-administered medications prior to visit.     Review of Systems;  Patient denies headache, fevers, malaise, unintentional weight loss, skin rash, eye pain, sinus congestion and sinus pain, sore throat, dysphagia,  hemoptysis , cough, dyspnea, wheezing, chest pain, palpitations, orthopnea, edema, abdominal pain, nausea, melena, diarrhea, constipation, flank pain, dysuria, hematuria, urinary  Frequency, nocturia, numbness, tingling, seizures,  Focal weakness, Loss of consciousness,  Tremor, insomnia, depression, anxiety, and suicidal ideation.      Objective:  BP (!) 146/66 (BP Location: Left Arm, Patient Position: Sitting, Cuff Size: Normal)   Pulse (!) 59   Temp 97.8 F (36.6 C) (Oral)   Resp 17   Ht 5' (1.524 m)   Wt 146 lb (66.2 kg)   SpO2 98%   BMI 28.51 kg/m   BP Readings from Last 3 Encounters:  09/29/17 (!) 120/50  09/28/17 (!) 146/66  09/21/17 (!) 148/50    Wt Readings from Last 3 Encounters:  09/29/17 145 lb (65.8 kg)  09/28/17 146 lb (66.2 kg)  09/19/17 146 lb (66.2 kg)    General appearance: alert, cooperative and appears stated age Ears: normal TM's and external ear canals both ears Throat: lips, mucosa, and tongue normal; teeth and gums normal Neck: no adenopathy, no carotid bruit, supple, symmetrical,  trachea midline and thyroid not enlarged, symmetric, no tenderness/mass/nodules Back: symmetric, no curvature. ROM normal. No CVA tenderness. Lungs: clear to auscultation bilaterally Heart: regular rate and rhythm, S1, S2 normal, no murmur, click, rub or gallop Abdomen: soft, non-tender; bowel sounds normal; no masses,  no organomegaly Pulses: 2+ and symmetric Skin: Skin color, texture, turgor normal. No rashes or lesions Lymph nodes: Cervical, supraclavicular, and axillary nodes normal.  Lab Results  Component Value Date   HGBA1C 5.0 06/16/2016    Lab Results  Component Value Date   CREATININE 2.16 (H) 09/21/2017   CREATININE 2.39 (H) 09/20/2017   CREATININE 2.57 (H) 09/19/2017    Lab Results  Component Value Date   WBC 7.0 09/20/2017   HGB 9.2 (L) 09/20/2017   HCT 27.7 (L) 09/20/2017   PLT 222 09/20/2017  GLUCOSE 127 (H) 09/21/2017   CHOL 124 06/16/2016   TRIG 87.0 06/16/2016   HDL 42.50 06/16/2016   LDLCALC 64 06/16/2016   ALT 9 08/14/2017   AST 13 08/14/2017   NA 140 09/21/2017   K 3.9 09/21/2017   CL 110 09/21/2017   CREATININE 2.16 (H) 09/21/2017   BUN 69 (H) 09/21/2017   CO2 21 (L) 09/21/2017   TSH 2.310 01/26/2017   INR 1.48 04/18/2017   HGBA1C 5.0 06/16/2016    No results found.  Assessment & Plan:   Problem List Items Addressed This Visit    Depression, major, single episode, moderate ([place])    Secondary to unresolved grief.  Starting lexapro . Prn alprazolam for anxiety       Venous insufficiency    She has had increased lower extremity edema since lasix was stopped       Relevant Medications   metoprolol tartrate (LOPRESSOR) 25 MG tablet   CKD (chronic kidney disease), stage IV ([facility])    Stable,.  She has no  plans for dialysis given her age      Hypertension    Well controlled on current regimen. Renal function stable, no changes today.      Relevant Medications   metoprolol tartrate (LOPRESSOR) 25 MG tablet   Anemia in chronic kidney  disease (CKD)    with concurrent B12 deficiency.  Not taking oral iron.  Will receive Procrit when hgb < 10       Anxiety state    Chronic. The risks and benefits of benzodiazepine use were reviewed with patient today including excessive sedation leading to respiratory depression,  impaired thinking/driving, and addiction.  Patient was advised to avoid concurrent use with alcohol, to use medication only as needed and not to share with others  .         Hospital discharge follow-up    Patient is stable post discharge and has no new issues or questions about discharge plans at the visit today for hospital follow up. All labs , imaging studies and progress notes from admission were reviewed with patient today         Other Visit Diagnoses    Hypokalemia    -  Primary   Relevant Orders   Basic Metabolic Panel (BMET)      I have discontinued Onnie Boer. [place]on's nystatin, promethazine-dextromethorphan, DULERA, hydrALAZINE, metoprolol tartrate, and chlorpheniramine-HYDROcodone. I have also changed her levothyroxine and metoprolol tartrate. Additionally, I am having her maintain her acetaminophen, fluticasone, PROAIR HFA, OPTICHAMBER ADVANTAGE, Vitamin D-3, apixaban, vitamin B-12, ALPRAZolam, temazepam, benzonatate, losartan, ipratropium-albuterol, budesonide, omeprazole, and escitalopram.  Meds ordered this encounter  Medications  . DISCONTD: hydrALAZINE (APRESOLINE) 50 MG tablet    Sig: Take 1 tablet (50 mg total) by mouth 3 (three) times daily. for hypertension    Dispense:  270 tablet    Refill:  3  . levothyroxine (SYNTHROID, LEVOTHROID) 100 MCG tablet    Sig: Take 1 tablet (100 mcg total) by mouth daily. For thyroid    Dispense:  90 tablet    Refill:  0  . DISCONTD: metoprolol tartrate (LOPRESSOR) 25 MG tablet    Sig: Take 0.5 tablets (12.5 mg total) by mouth 2 (two) times daily.    Dispense:  180 tablet    Refill:  3  . metoprolol tartrate (LOPRESSOR) 25 MG tablet    Sig: Take  0.5 tablets (12.5 mg total) by mouth 2 (two) times daily. For hypertension    Dispense:  180 tablet    Refill:  3    Medications Discontinued During This Encounter  Medication Reason  . chlorpheniramine-HYDROcodone (TUSSIONEX PENNKINETIC ER) 10-8 MG/5ML SUER   . DULERA 100-5 MCG/ACT AERO   . hydrALAZINE (APRESOLINE) 50 MG tablet   . levothyroxine (SYNTHROID, LEVOTHROID) 100 MCG tablet   . metoprolol tartrate (LOPRESSOR) 25 MG tablet Reorder  . nystatin (MYCOSTATIN) 100000 UNIT/ML suspension   . metoprolol tartrate (LOPRESSOR) 25 MG tablet   . promethazine-dextromethorphan (PROMETHAZINE-DM) 6.25-15 MG/5ML syrup     Follow-up: No Follow-up on file.   [name], MD

## 2017-09-29 ENCOUNTER — Ambulatory Visit: Payer: PPO | Admitting: Cardiovascular Disease

## 2017-09-29 ENCOUNTER — Encounter: Payer: Self-pay | Admitting: Cardiovascular Disease

## 2017-09-29 VITALS — BP 120/50 | HR 53 | Ht 60.0 in | Wt 145.0 lb

## 2017-09-29 DIAGNOSIS — I481 Persistent atrial fibrillation: Secondary | ICD-10-CM | POA: Diagnosis not present

## 2017-09-29 DIAGNOSIS — I4819 Other persistent atrial fibrillation: Secondary | ICD-10-CM

## 2017-09-29 DIAGNOSIS — I5032 Chronic diastolic (congestive) heart failure: Secondary | ICD-10-CM

## 2017-09-29 DIAGNOSIS — I10 Essential (primary) hypertension: Secondary | ICD-10-CM | POA: Diagnosis not present

## 2017-09-29 MED ORDER — HYDRALAZINE HCL 50 MG PO TABS: 50.0000 mg | ORAL_TABLET | Freq: Two times a day (BID) | ORAL | Status: DC

## 2017-09-29 NOTE — Patient Instructions (Signed)
Medication Instructions: - Your physician has recommended you make the following change in your medication:  1) DECREASE hydralazine 50 mg- take 1 tablet (50 mg) by mouth TWICE daily  Labwork: - non ordered  Procedures/Testing: - none ordered  Follow-Up: - Your physician recommends that you schedule a follow-up appointment in: 4 months with Dr. Fletcher Anon.   Any Additional Special Instructions Will Be Listed Below (If Applicable).     If you need a refill on your cardiac medications before your next appointment, please call your pharmacy.

## 2017-09-29 NOTE — Progress Notes (Signed)
Cardiology Office Note   Date:  09/29/2017   ID:  Savannah Wilson, DOB 1929/06/15, MRN [id number]  PCP:  [name], MD  Cardiologist:   Kathlyn Sacramento, MD   Chief Complaint  Patient presents with  . other    3 month follow up. Meds reviewed by the pt.'s daughter. Pt. was at [facility] with renal failure on 09-19-17. Pt. c/o shortness of breath with chest pain at times.       History of Present Illness: Savannah Wilson is a 84 y.o. female who presents for a follow-up visit regarding atrial fibrillation and chronic diastolic heart failure. She has known history of stage IV chronic kidney disease, asthma/COPD, essential hypertension, hypothyroidism, anemia of chronic disease and Barrett's esophagus. She underwent successful cardioversion to sinus rhythm in August, 2018. She presented to [facility] in September with worsening shortness of breath. She was found to be volume overloaded with severe hypertension on presentation. She slowly improved with diuresis which was somewhat difficult due to underlying chronic kidney disease. She had a repeat echocardiogram done which showed normal LV systolic function with inferior wall hypokinesis. There was moderate mitral regurgitation and moderate pulmonary hypertension with peak systolic pressure of 52 mmHg. She underwent a pharmacologic nuclear stress test with Lexiscan which showed evidence of prior inferior/inferolateral infarct with minimal peri-infarct ischemia. The patient had severe hypotension with Lexiscan.  She was hospitalized on February 12 at [facility] with acute on chronic renal failure due to dehydration and suspected depression.  Lasix was stopped and she was given gentle hydration.    She reports improvement in appetite since then.  She complains of frequent dizziness especially when she stands up.  She also reports occasional low blood pressure readings. She was seen by Dr. Candiss Norse in early this week and was noted to have significant bilateral leg  edema.  Thus, furosemide was resumed.  No chest pain or palpitations.  Past Medical History:  Diagnosis Date  . Asthma   . Barrett esophagus   . Chronic bronchitis   . Chronic kidney disease (CKD), stage IV (severe) ([place])   . Depression   . Diverticulosis   . GERD (gastroesophageal reflux disease)   . Gout   . Hyperlipidemia   . Hypertension   . Hypothyroid   . Mitral regurgitation    a. TTE 02/2017: EF 55-60%, normal WM, calcified mitral annulus with mod regurg, moderately dilated LA, unable to estimate PASP  . Pancreatitis due to biliary obstruction March 2010   s/p ERCP sphincterotomy, cholecystectomy  . Peripheral neuralgia   . Persistent atrial fibrillation ([name])    a. diagnosed 01/24/17; CHADS2VASc => 5 (HTN, age x 2, vascular disease with PAD and aortic plaque, female) giving her an estimated annual stroke risk of 6.7%; b. successful DCCV 03/28/2017; c. on Eliquis  . Venous insufficiency     Past Surgical History:  Procedure Laterality Date  . ABDOMINAL HYSTERECTOMY    . CARDIOVERSION N/A 03/28/2017   Procedure: CARDIOVERSION;  Surgeon: Nelva Bush, MD;  Location: [facility];  Service: Cardiovascular;  Laterality: N/A;  . CARPAL TUNNEL RELEASE  jan 2013   [name]  . CATARACT EXTRACTION     right  . CATARACT EXTRACTION  2008  . CHOLECYSTECTOMY  02/10  . CHOLECYSTECTOMY  2010  . TONSILLECTOMY    . VENTRAL HERNIA REPAIR    . VENTRAL HERNIA REPAIR  2007  . VESICOVAGINAL FISTULA CLOSURE W/ TAH       Current Outpatient Medications  Medication Sig Dispense Refill  . acetaminophen (TYLENOL) 650 MG CR tablet Take 650 mg by mouth every 8 (eight) hours as needed for pain.     Marland Kitchen ALPRAZolam (XANAX) 0.5 MG tablet TAKE ONE AND ONE-HALF TABLETS AT BEDTIMEAS NEEDED FOR ANXIETY 30 tablet 1  . apixaban (ELIQUIS) 2.5 MG TABS tablet Take 1 tablet (2.5 mg total) by mouth 2 (two) times daily. 180 tablet 3  . benzonatate (TESSALON) 200 MG capsule Take 1 capsule (200 mg total) by  mouth 3 (three) times daily as needed for cough. 60 capsule 1  . budesonide (PULMICORT) 0.25 MG/2ML nebulizer solution Take 2 mLs (0.25 mg total) by nebulization 2 (two) times daily. 60 mL 12  . Cholecalciferol (VITAMIN D-3) 1000 units CAPS Take 1,000 Units by mouth daily.     Marland Kitchen escitalopram (LEXAPRO) 10 MG tablet Take 1 tablet (10 mg total) by mouth daily. 30 tablet 2  . fluticasone (FLONASE) 50 MCG/ACT nasal spray Place 2 sprays into the nose daily. (Patient taking differently: Place 2 sprays into the nose daily as needed (For allergies.). ) 16 g 6  . furosemide (LASIX) 40 MG tablet Take 40 mg by mouth daily.    . hydrALAZINE (APRESOLINE) 50 MG tablet Take 1 tablet (50 mg total) by mouth 3 (three) times daily. for hypertension 270 tablet 3  . ipratropium-albuterol (DUONEB) 0.5-2.5 (3) MG/3ML SOLN Take 3 mLs by nebulization every 6 (six) hours as needed. 360 mL 0  . levothyroxine (SYNTHROID, LEVOTHROID) 100 MCG tablet Take 1 tablet (100 mcg total) by mouth daily. For thyroid 90 tablet 0  . losartan (COZAAR) 50 MG tablet Take 1 tablet (50 mg total) by mouth daily. 90 tablet 1  . metoprolol tartrate (LOPRESSOR) 25 MG tablet Take 0.5 tablets (12.5 mg total) by mouth 2 (two) times daily. For hypertension 180 tablet 3  . omeprazole (PRILOSEC) 40 MG capsule TAKE ONE CAPSULE BY MOUTH TWICE A DAY. BEFORE BREAKFAST AND SUPPER 180 capsule 1  . PROAIR HFA 108 (90 BASE) MCG/ACT inhaler INHALE 2 PUFFS EVERY 6 HOURS AS NEEDED FOR WHEEZING 8.5 g 3  . Spacer/Aero-Holding Chambers (OPTICHAMBER ADVANTAGE) MISC 1 each by Other route once. Always uses her when you're using a metered-dose inhaler. You've aromatase medicine as much, he won't have his much side effect, but you it twice as much medicine and your lungs. 1 each 0  . temazepam (RESTORIL) 15 MG capsule TAKE 1 CAPSULE AT BEDTIME AS NEEDED FOR SLEEP 30 capsule 2  . vitamin B-12 (CYANOCOBALAMIN) 1000 MCG tablet Take 1,000 mcg by mouth daily.     No current  facility-administered medications for this visit.     Allergies:   Ace inhibitors; Benicar [olmesartan medoxomil]; Clarithromycin; Clarithromycin; Norvasc [amlodipine besylate]; Levofloxacin; and Zithromax [azithromycin dihydrate]    Social History:  The patient  reports that  has never smoked. she has never used smokeless tobacco. She reports that she does not drink alcohol or use drugs.   Family History:  The patient's family history includes Cancer in her daughter; Heart disease in her father; Hypertension in her mother; Kidney cancer in her father; Kidney disease in her daughter and mother; Kidney failure in her mother; Multiple myeloma in her daughter; Rheumatologic disease in her father.    ROS:  Please see the history of present illness.   Otherwise, review of systems are positive for none.   All other systems are reviewed and negative.    PHYSICAL EXAM: VS:  BP (!) 120/50 (BP Location:  Left Arm, Patient Position: Sitting, Cuff Size: Normal)   Pulse (!) 53   Ht 5' (1.524 m)   Wt 145 lb (65.8 kg)   BMI 28.32 kg/m  , BMI Body mass index is 28.32 kg/m. GEN: Well nourished, well developed, in no acute distress  HEENT: normal  Neck: no JVD, carotid bruits, or masses Cardiac: Regular rate and rhythm; no murmurs, rubs, or gallops, moderate bilateral edema  Respiratory:  clear to auscultation bilaterally, normal work of breathing GI: soft, nontender, nondistended, + BS MS: no deformity or atrophy  Skin: warm and dry, no rash Neuro:  Strength and sensation are intact Psych: euthymic mood, full affect   EKG:  EKG is ordered today. The ekg ordered today demonstrates sinus bradycardia with nonspecific ST changes.   Recent Labs: 01/26/2017: TSH 2.310 04/15/2017: B Natriuretic Peptide 1,460.0 08/14/2017: ALT 9; Pro B Natriuretic peptide (BNP) 473.0 09/20/2017: Hemoglobin 9.2; Platelets 222 09/21/2017: BUN 69; Creatinine, Ser 2.16; Magnesium 1.7; Potassium 3.9; Sodium 140    Lipid  Panel    Component Value Date/Time   CHOL 124 06/16/2016 0945   TRIG 87.0 06/16/2016 0945   HDL 42.50 06/16/2016 0945   CHOLHDL 3 06/16/2016 0945   VLDL 17.4 06/16/2016 0945   LDLCALC 64 06/16/2016 0945      Wt Readings from Last 3 Encounters:  09/29/17 145 lb (65.8 kg)  09/28/17 146 lb (66.2 kg)  09/19/17 146 lb (66.2 kg)      PAD Screen 01/26/2017  Previous PAD dx? No  Previous surgical procedure? No  Pain with walking? No  Feet/toe relief with dangling? No  Painful, non-healing ulcers? No  Extremities discolored? No      ASSESSMENT AND PLAN:  1.  Persistent atrial fibrillation: She remains in sinus rhythm.  She is mildly bradycardic but this has been stable.  Continue small dose metoprolol and low-dose Eliquis.  2. Chronic diastolic heart failure with moderate pulmonary hypertension: She has significant leg edema and I agree with resuming furosemide.  3. Coronary artery disease involving native coronary arteries with other forms of angina: Nuclear stress test showed evidence of prior inferior and inferolateral infarct with minimal ischemia.  Continue medical therapy.  4. Essential hypertension: She reports new dizziness and occasional low blood pressure readings.  I elected to change hydralazine to twice daily instead of 3 times daily.  5. Chronic kidney disease: Stable and followed by nephrology.   Disposition:   FU with me in 4 months  Signed,  Kathlyn Sacramento, MD  09/29/2017 2:38 PM    [place]

## 2017-09-30 NOTE — Assessment & Plan Note (Signed)
She has had increased lower extremity edema since lasix was stopped

## 2017-09-30 NOTE — Assessment & Plan Note (Signed)
Chronic. The risks and benefits of benzodiazepine use were reviewed with patient today including excessive sedation leading to respiratory depression,  impaired thinking/driving, and addiction.  Patient was advised to avoid concurrent use with alcohol, to use medication only as needed and not to share with others  .

## 2017-09-30 NOTE — Assessment & Plan Note (Signed)
Well controlled on current regimen. Renal function stable, no changes today. 

## 2017-09-30 NOTE — Assessment & Plan Note (Signed)
Patient is stable post discharge and has no new issues or questions about discharge plans at the visit today for hospital follow up. All labs , imaging studies and progress notes from admission were reviewed with patient today   

## 2017-09-30 NOTE — Assessment & Plan Note (Signed)
Stable,.  She has no  plans for dialysis given her age

## 2017-09-30 NOTE — Assessment & Plan Note (Signed)
with concurrent B12 deficiency.  Not taking oral iron.  Will receive Procrit when hgb < 10

## 2017-09-30 NOTE — Assessment & Plan Note (Addendum)
Secondary to unresolved grief.  Starting lexapro . Prn alprazolam for anxiety

## 2017-10-02 ENCOUNTER — Encounter: Payer: Self-pay | Admitting: Internal Medicine

## 2017-10-13 ENCOUNTER — Inpatient Hospital Stay: Payer: PPO | Admitting: Internal Medicine

## 2017-10-23 ENCOUNTER — Other Ambulatory Visit: Payer: Self-pay | Admitting: Internal Medicine

## 2017-10-23 NOTE — Telephone Encounter (Signed)
Okay to refill? Last refilled on 04/20/17 for #30 with one refill.  LOV: 09/28/17 NOV; N/A

## 2017-10-25 NOTE — Telephone Encounter (Signed)
Rx faxed to [facility]

## 2017-10-26 DIAGNOSIS — N184 Chronic kidney disease, stage 4 (severe): Secondary | ICD-10-CM | POA: Diagnosis not present

## 2017-10-26 DIAGNOSIS — E875 Hyperkalemia: Secondary | ICD-10-CM | POA: Diagnosis not present

## 2017-10-26 DIAGNOSIS — I129 Hypertensive chronic kidney disease with stage 1 through stage 4 chronic kidney disease, or unspecified chronic kidney disease: Secondary | ICD-10-CM | POA: Diagnosis not present

## 2017-10-26 DIAGNOSIS — R809 Proteinuria, unspecified: Secondary | ICD-10-CM | POA: Diagnosis not present

## 2017-10-26 DIAGNOSIS — N2581 Secondary hyperparathyroidism of renal origin: Secondary | ICD-10-CM | POA: Diagnosis not present

## 2017-10-26 DIAGNOSIS — D631 Anemia in chronic kidney disease: Secondary | ICD-10-CM | POA: Diagnosis not present

## 2017-11-06 ENCOUNTER — Telehealth: Payer: Self-pay | Admitting: Cardiovascular Disease

## 2017-11-06 MED ORDER — HYDRALAZINE HCL 50 MG PO TABS: 50.0000 mg | ORAL_TABLET | Freq: Two times a day (BID) | ORAL | 3 refills | Status: DC

## 2017-11-06 NOTE — Telephone Encounter (Signed)
Pt calling stating she needs refill    *STAT* If patient is at the pharmacy, call can be transferred to refill team.   1. Which medications need to be refilled? (please list name of each medication and dose if known) Hydralazine   2. Which pharmacy/location (including street and city if local pharmacy) is medication to be sent to[facility]   3. Do they need a 30 day or 90 day supply? 90 day

## 2017-11-21 DIAGNOSIS — L820 Inflamed seborrheic keratosis: Secondary | ICD-10-CM | POA: Diagnosis not present

## 2017-11-21 DIAGNOSIS — L821 Other seborrheic keratosis: Secondary | ICD-10-CM | POA: Diagnosis not present

## 2017-11-28 ENCOUNTER — Other Ambulatory Visit: Payer: Self-pay | Admitting: Internal Medicine

## 2017-11-28 NOTE — Telephone Encounter (Signed)
Refilled: 05/03/2017 Last OV: 09/28/2017 Next OV: 01/29/2018

## 2017-11-29 NOTE — Telephone Encounter (Signed)
Printed, signed and faxed.  

## 2017-12-01 ENCOUNTER — Telehealth: Payer: Self-pay | Admitting: Cardiovascular Disease

## 2017-12-01 NOTE — Telephone Encounter (Signed)
Lmov for patient to call back Provider won't be in office 01/19/18   Will need to reschedule to another day  Will try again at a later time

## 2017-12-05 NOTE — Telephone Encounter (Signed)
R/s 7/18 at 11 am

## 2017-12-11 ENCOUNTER — Telehealth: Payer: Self-pay | Admitting: Cardiovascular Disease

## 2017-12-11 MED ORDER — LOSARTAN POTASSIUM 50 MG PO TABS: 50.0000 mg | ORAL_TABLET | Freq: Every day | ORAL | 0 refills | Status: DC

## 2017-12-11 NOTE — Telephone Encounter (Signed)
Pt calling stating she needs refill asking for a 26m supply to be called in      *STAT* If patient is at the pharmacy, call can be transferred to refill team.   1. Which medications need to be refilled? (please list name of each medication and dose if known) Hydralazine   2. Which pharmacy/location (including street and city if local pharmacy) is medication to be sent to? [facility]   3. Do they need a 30 day or 90 day supply? 90 day

## 2017-12-27 ENCOUNTER — Telehealth: Payer: Self-pay

## 2017-12-27 DIAGNOSIS — J40 Bronchitis, not specified as acute or chronic: Secondary | ICD-10-CM

## 2017-12-27 NOTE — Telephone Encounter (Signed)
Copied from [place] [id number]. Topic: Quick Communication - See Telephone Encounter >> Dec 27, 2017 10:09 AM Antonieta Iba C wrote: CRM for notification. See Telephone encounter for: 12/27/17.   Pt called to follow up on a referral for her lungs. Pt says that she has not heard from anyone, not showing referral in system . Pt is requesting a call back from [facility], Please call pt back at: [phone]

## 2017-12-28 NOTE — Telephone Encounter (Signed)
Referral is in process as requested 

## 2017-12-28 NOTE — Telephone Encounter (Signed)
Don't see a referral for pulmonology.

## 2017-12-28 NOTE — Addendum Note (Signed)
Addended by: [name] on: 12/28/2017 05:21 PM   Modules accepted: Orders

## 2017-12-29 NOTE — Telephone Encounter (Signed)
Patient notified and voiced understanding,

## 2018-01-04 ENCOUNTER — Encounter: Payer: Self-pay | Admitting: Internal Medicine

## 2018-01-04 ENCOUNTER — Ambulatory Visit: Payer: PPO | Admitting: Internal Medicine

## 2018-01-04 VITALS — BP 110/60 | HR 53 | Resp 16 | Ht 60.0 in | Wt 144.0 lb

## 2018-01-04 DIAGNOSIS — J40 Bronchitis, not specified as acute or chronic: Secondary | ICD-10-CM

## 2018-01-04 DIAGNOSIS — J42 Unspecified chronic bronchitis: Secondary | ICD-10-CM

## 2018-01-04 MED ORDER — ALBUTEROL SULFATE HFA 108 (90 BASE) MCG/ACT IN AERS: 1.0000 | INHALATION_SPRAY | Freq: Four times a day (QID) | RESPIRATORY_TRACT | 5 refills | Status: DC | PRN

## 2018-01-04 NOTE — Progress Notes (Signed)
[facility] Consultation      Assessment and Plan:  Chronic bronchitis/COPD. - Patient has symptoms of dyspnea and excess mucus production, consistent with chronic bronchitis.  Currently symptoms to be well-controlled, I explained that it is normal to have excess mucus production in the morning, which she should be able to clear out.  This becomes an issue or she develops recurrent infections from this, she is asked to call us back for further intervention. -Currently the patient notes that she feels jittery with nebulizers, presumably from the DuoNeb.  I therefore asked her to stop the DuoNeb as she appears to be doing well currently.  She can continue on budesonide nebulizer twice daily.  I have asked her to use pro-air 2 puffs as needed during the day for dyspnea.  We will hold off on lying inhalers at this time due to their cost, and the fact that she appears to be doing well.  We can revisit this if her breathing declines further.   Date: 01/04/2018  MRN# [id number] Savannah Wilson [birth year]  Referring Physician: Dr. Leia Alf is a 84 y.o. old female seen in consultation for chief complaint of:    Chief Complaint  Patient presents with  . Consult    former Dr. Melvyn Novas referred by Dr. Derrel Nip for chronic bronchitis.  . Asthma  . Shortness of Breath    with exertion  . Wheezing  . chest congestion    HPI:   84 yo wf never smoked with COPD, diastolic congestive heart failure, essential hypertension.  Previously seen at [facility] pulmonary, last in 2012, due to recurrent bouts of bronchitis. She returns today because she gets out of breath. She is present with her daughter today who gives some history. She was started on a pulmicort and duoneb nebulizer but makes her feel "terrible" and does not feel that it helps her breathing.  In the early part of this year she had a bad episode of bronchitis, she received multiple courses of abx and steroids. At that time  she was started on a nebulizer. She was using dulera and felt that it helped but it was too expensive and she was having issues with donut hole, therefore has been relying on the nebs.   She never smoked, her husband smoked no other second hand exposure. 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 Today, she feels that her breathing is doing fairly well, and she feels that it is been doing fairly well over the past few weeks.  She and her daughter noticed that she has some mildly productive cough during the day, mostly in the mornings when she first wakes up.  She feels that her breathing is doing okay currently, she does not want to use a nebulizer because it gives her that strange jittery feeling.  Previous O.V. 11/05/2010 ov cc recurrent bronchitis x sev decades, Assoc with difficulty breathing and transient need for inhalers then Dec 2011 on trip to [facility] by bus developed persistent doe at > slow adls so started on advair and proaire with only a little improvement, Assoc with hoarseness, mostly dry cough. Has chronic leg swelling no correlation with cough. proaire works best but hfa < 10% in terms of technique . Mild assoc nasal congestion, sense of pnd but no sign sputum production or rhinorhea. rec change advair to dulera 100 And change metaprolol to bystolic in case of spillover B2 effect, work on inhaler technique  12/03/2010 ov/Wert cc "not better at  all" x 2 weeks p ov then ha then checked bp 239/101 then [facility] where bystolic stopped restart the metaprolol and add back lasix and breathing got better but still not back to baseline but now able to ex for 20 min. No more cough.  rec trial of dulera 100 and max gerd diet/ ppi, ok to leave on metaprolol   01/07/2011 ov/Wert all smiles  Cc only sob with steps,  Sleeping ok without nocturnal  or early am exac of resp c/o's or need for noct saba.  No cough. Pt denies any significant sore throat, dysphagia, itching, sneezing,  nasal  congestion or excess/ purulent secretions,  fever, chills, sweats, unintended wt loss, pleuritic or exertional cp, hempoptysis, orthopnea pnd or leg swelling.     **Imaging personally reviewed, chest x-ray 09/11/2017, mild hyperinflation consistent with emphysema, small right pleural effusion.   PMHX:   Past Medical History:  Diagnosis Date  . Asthma   . Barrett esophagus   . Chronic bronchitis   . Chronic kidney disease (CKD), stage IV (severe) ([place])   . Depression   . Diverticulosis   . GERD (gastroesophageal reflux disease)   . Gout   . Hyperlipidemia   . Hypertension   . Hypothyroid   . Mitral regurgitation    a. TTE 02/2017: EF 55-60%, normal WM, calcified mitral annulus with mod regurg, moderately dilated LA, unable to estimate PASP  . Pancreatitis due to biliary obstruction March 2010   s/p ERCP sphincterotomy, cholecystectomy  . Peripheral neuralgia   . Persistent atrial fibrillation ([place])    a. diagnosed 01/24/17; CHADS2VASc => 5 (HTN, age x 2, vascular disease with PAD and aortic plaque, female) giving her an estimated annual stroke risk of 6.7%; b. successful DCCV 03/28/2017; c. on Eliquis  . Venous insufficiency    Surgical Hx:  Past Surgical History:  Procedure Laterality Date  . ABDOMINAL HYSTERECTOMY    . CARDIOVERSION N/A 03/28/2017   Procedure: CARDIOVERSION;  Surgeon: Nelva Bush, MD;  Location: [facility];  Service: Cardiovascular;  Laterality: N/A;  . CARPAL TUNNEL RELEASE  jan 2013   [name]  . CATARACT EXTRACTION     right  . CATARACT EXTRACTION  2008  . CHOLECYSTECTOMY  02/10  . CHOLECYSTECTOMY  2010  . TONSILLECTOMY    . VENTRAL HERNIA REPAIR    . VENTRAL HERNIA REPAIR  2007  . VESICOVAGINAL FISTULA CLOSURE W/ TAH     Family Hx:  Family History  Problem Relation Age of Onset  . Kidney failure Mother   . Hypertension Mother   . Kidney disease Mother   . Multiple myeloma Daughter   . Cancer Daughter        multiple myeloma  . Kidney  disease Daughter   . Heart disease Father   . Rheumatologic disease Father   . Kidney cancer Father    Social Hx:   Social History   Tobacco Use  . Smoking status: Never Smoker  . Smokeless tobacco: Never Used  Substance Use Topics  . Alcohol use: No    Comment: rare  . Drug use: No   Medication:    Current Outpatient Medications:  .  acetaminophen (TYLENOL) 650 MG CR tablet, Take 650 mg by mouth every 8 (eight) hours as needed for pain. , Disp: , Rfl:  .  ALPRAZolam (XANAX) 0.5 MG tablet, TAKE ONE AND ONE-HALF TABLETS AT BEDTIMEAS NEEDED FOR ANXIETY, Disp: 30 tablet, Rfl: 5 .  apixaban (ELIQUIS) 2.5 MG TABS tablet,  Take 1 tablet (2.5 mg total) by mouth 2 (two) times daily., Disp: 180 tablet, Rfl: 3 .  benzonatate (TESSALON) 200 MG capsule, Take 1 capsule (200 mg total) by mouth 3 (three) times daily as needed for cough., Disp: 60 capsule, Rfl: 1 .  budesonide (PULMICORT) 0.25 MG/2ML nebulizer solution, Take 2 mLs (0.25 mg total) by nebulization 2 (two) times daily., Disp: 60 mL, Rfl: 12 .  Cholecalciferol (VITAMIN D-3) 1000 units CAPS, Take 1,000 Units by mouth daily. , Disp: , Rfl:  .  escitalopram (LEXAPRO) 10 MG tablet, Take 1 tablet (10 mg total) by mouth daily., Disp: 30 tablet, Rfl: 2 .  fluticasone (FLONASE) 50 MCG/ACT nasal spray, Place 2 sprays into the nose daily. (Patient taking differently: Place 2 sprays into the nose daily as needed (For allergies.). ), Disp: 16 g, Rfl: 6 .  furosemide (LASIX) 40 MG tablet, Take 40 mg by mouth daily., Disp: , Rfl:  .  ipratropium-albuterol (DUONEB) 0.5-2.5 (3) MG/3ML SOLN, Take 3 mLs by nebulization every 6 (six) hours as needed., Disp: 360 mL, Rfl: 0 .  levothyroxine (SYNTHROID, LEVOTHROID) 100 MCG tablet, Take 1 tablet (100 mcg total) by mouth daily. For thyroid, Disp: 90 tablet, Rfl: 0 .  losartan (COZAAR) 50 MG tablet, Take 1 tablet (50 mg total) by mouth daily., Disp: 90 tablet, Rfl: 0 .  omeprazole (PRILOSEC) 40 MG capsule, TAKE  ONE CAPSULE BY MOUTH TWICE A DAY. BEFORE BREAKFAST AND SUPPER, Disp: 180 capsule, Rfl: 1 .  PROAIR HFA 108 (90 BASE) MCG/ACT inhaler, INHALE 2 PUFFS EVERY 6 HOURS AS NEEDED FOR WHEEZING, Disp: 8.5 g, Rfl: 3 .  Spacer/Aero-Holding Chambers (OPTICHAMBER ADVANTAGE) MISC, 1 each by Other route once. Always uses her when you're using a metered-dose inhaler. You've aromatase medicine as much, he won't have his much side effect, but you it twice as much medicine and your lungs., Disp: 1 each, Rfl: 0 .  temazepam (RESTORIL) 15 MG capsule, TAKE 1 CAPSULE BY MOUTH AT BEDTIME AS NEEDED FOR SLEEP, Disp: 30 capsule, Rfl: 2 .  vitamin B-12 (CYANOCOBALAMIN) 1000 MCG tablet, Take 1,000 mcg by mouth daily., Disp: , Rfl:  .  hydrALAZINE (APRESOLINE) 50 MG tablet, Take 1 tablet (50 mg total) by mouth 2 (two) times daily. for hypertension, Disp: 60 tablet, Rfl: 3 .  metoprolol tartrate (LOPRESSOR) 25 MG tablet, Take 0.5 tablets (12.5 mg total) by mouth 2 (two) times daily. For hypertension, Disp: 180 tablet, Rfl: 3   Allergies:  Ace inhibitors; Benicar [olmesartan medoxomil]; Clarithromycin; Clarithromycin; Norvasc [amlodipine besylate]; Levofloxacin; and Zithromax [azithromycin dihydrate]  Review of Systems: Gen:  Denies  fever, sweats, chills HEENT: Denies blurred vision, double vision. bleeds, sore throat Cvc:  No dizziness, chest pain. Resp:   Denies cough or sputum production, shortness of breath Gi: Denies swallowing difficulty, stomach pain. Gu:  Denies bladder incontinence, burning urine Ext:   No Joint pain, stiffness. Skin: No skin rash,  hives  Endoc:  No polyuria, polydipsia. Psych: No depression, insomnia. Other:  All other systems were reviewed with the patient and were negative other that what is mentioned in the HPI.   Physical Examination:   VS: BP 110/60 (BP Location: Left Arm, Cuff Size: Normal)   Pulse (!) 53   Resp 16   Ht 5' (1.524 m)   Wt 144 lb (65.3 kg)   SpO2 97%   BMI 28.12  kg/m   General Appearance: No distress  Neuro:without focal findings,  speech normal,  HEENT: PERRLA,  EOM intact.   Pulmonary: normal breath sounds, No wheezing.  CardiovascularNormal S1,S2.  No m/r/g.   Abdomen: Benign, Soft, non-tender. Renal:  No costovertebral tenderness  GU:  No performed at this time. Endoc: No evident thyromegaly, no signs of acromegaly. Skin:   warm, no rashes, no ecchymosis  Extremities: normal, no cyanosis, clubbing.  Other findings:    LABORATORY PANEL:   CBC No results for input(s): WBC, HGB, HCT, PLT in the last 168 hours. ------------------------------------------------------------------------------------------------------------------  Chemistries  No results for input(s): NA, K, CL, CO2, GLUCOSE, BUN, CREATININE, CALCIUM, MG, AST, ALT, ALKPHOS, BILITOT in the last 168 hours.  Invalid input(s): GFRCGP ------------------------------------------------------------------------------------------------------------------  Cardiac Enzymes No results for input(s): TROPONINI in the last 168 hours. ------------------------------------------------------------  RADIOLOGY:  No results found.     Thank  you for the consultation and for allowing [facility] to assist in the care of your patient. Our recommendations are noted above.  Please contact us if we can be of further service.   Marda Stalker, MD.  Board Certified in Internal Medicine, Pulmonary Medicine, [place], and Sleep Medicine.  [facility] Office Number: [phone]  Patricia Pesa, M.D.  Merton Border, M.D  01/04/2018

## 2018-01-04 NOTE — Patient Instructions (Addendum)
Stop duoneb nebulizer.  Continue budesonide nebulizer twice daily.  Restart albuterol inhaler as needed.

## 2018-01-19 ENCOUNTER — Ambulatory Visit: Payer: PPO | Admitting: Cardiovascular Disease

## 2018-01-29 ENCOUNTER — Encounter: Payer: Self-pay | Admitting: Internal Medicine

## 2018-01-29 ENCOUNTER — Ambulatory Visit (INDEPENDENT_AMBULATORY_CARE_PROVIDER_SITE_OTHER): Payer: PPO | Admitting: Internal Medicine

## 2018-01-29 VITALS — BP 140/58 | HR 57 | Temp 98.1°F | Resp 15 | Ht 60.0 in | Wt 142.8 lb

## 2018-01-29 DIAGNOSIS — I4891 Unspecified atrial fibrillation: Secondary | ICD-10-CM

## 2018-01-29 DIAGNOSIS — R0609 Other forms of dyspnea: Secondary | ICD-10-CM | POA: Diagnosis not present

## 2018-01-29 DIAGNOSIS — J45909 Unspecified asthma, uncomplicated: Secondary | ICD-10-CM

## 2018-01-29 DIAGNOSIS — F321 Major depressive disorder, single episode, moderate: Secondary | ICD-10-CM

## 2018-01-29 MED ORDER — ESCITALOPRAM OXALATE 10 MG PO TABS: 10.0000 mg | ORAL_TABLET | Freq: Every day | ORAL | 1 refills | Status: DC

## 2018-01-29 MED ORDER — HYDRALAZINE HCL 50 MG PO TABS: 50.0000 mg | ORAL_TABLET | Freq: Two times a day (BID) | ORAL | 3 refills | Status: DC

## 2018-01-29 NOTE — Progress Notes (Signed)
Subjective:  Patient ID: Savannah Wilson, female    DOB: October 08, 1928  Age: 84 y.o. MRN: [id number]  CC: Diagnoses of Dyspnea on exertion and Depression, major, single episode, moderate ([place]) were pertinent to this visit.  HPI Savannah Wilson presents for follow up on GAD with depression,  CKD and hypertension  Saw new pulmonologist may 60.  duonebs stopped due to reports of tachycardia, ,  pulmicort nebs continued and she is using albuterol MDI 2 puffs twice daily.   Taking restoril for insomnia . Did not continue the lexapro.  No side effects.  Still has the "glass is half empty" attitude.  Daughter agrees she has not improved. Worries about issues beyond her control.  Has a new dog which has helped.    Short of breath with minimal exertion.  Discussed cardiopulmonary rehab referral to improve her walking distance.  Outpatient Medications Prior to Visit  Medication Sig Dispense Refill  . acetaminophen (TYLENOL) 650 MG CR tablet Take 650 mg by mouth every 8 (eight) hours as needed for pain.     Marland Kitchen albuterol (PROAIR HFA) 108 (90 Base) MCG/ACT inhaler Inhale 1-2 puffs into the lungs every 6 (six) hours as needed for wheezing or shortness of breath. 1 Inhaler 5  . ALPRAZolam (XANAX) 0.5 MG tablet TAKE ONE AND ONE-HALF TABLETS AT BEDTIMEAS NEEDED FOR ANXIETY 30 tablet 5  . apixaban (ELIQUIS) 2.5 MG TABS tablet Take 1 tablet (2.5 mg total) by mouth 2 (two) times daily. 180 tablet 3  . benzonatate (TESSALON) 200 MG capsule Take 1 capsule (200 mg total) by mouth 3 (three) times daily as needed for cough. 60 capsule 1  . budesonide (PULMICORT) 0.25 MG/2ML nebulizer solution Take 2 mLs (0.25 mg total) by nebulization 2 (two) times daily. 60 mL 12  . Cholecalciferol (VITAMIN D-3) 1000 units CAPS Take 1,000 Units by mouth daily.     . fluticasone (FLONASE) 50 MCG/ACT nasal spray Place 2 sprays into the nose daily. (Patient taking differently: Place 2 sprays into the nose daily as needed (For allergies.). )  16 g 6  . furosemide (LASIX) 40 MG tablet Take 40 mg by mouth daily.    Marland Kitchen ipratropium-albuterol (DUONEB) 0.5-2.5 (3) MG/3ML SOLN Take 3 mLs by nebulization every 6 (six) hours as needed. 360 mL 0  . levothyroxine (SYNTHROID, LEVOTHROID) 100 MCG tablet Take 1 tablet (100 mcg total) by mouth daily. For thyroid 90 tablet 0  . omeprazole (PRILOSEC) 40 MG capsule TAKE ONE CAPSULE BY MOUTH TWICE A DAY. BEFORE BREAKFAST AND SUPPER 180 capsule 1  . PROAIR HFA 108 (90 BASE) MCG/ACT inhaler INHALE 2 PUFFS EVERY 6 HOURS AS NEEDED FOR WHEEZING 8.5 g 3  . Spacer/Aero-Holding Chambers (OPTICHAMBER ADVANTAGE) MISC 1 each by Other route once. Always uses her when you're using a metered-dose inhaler. You've aromatase medicine as much, he won't have his much side effect, but you it twice as much medicine and your lungs. 1 each 0  . temazepam (RESTORIL) 15 MG capsule TAKE 1 CAPSULE BY MOUTH AT BEDTIME AS NEEDED FOR SLEEP 30 capsule 2  . vitamin B-12 (CYANOCOBALAMIN) 1000 MCG tablet Take 1,000 mcg by mouth daily.    Marland Kitchen escitalopram (LEXAPRO) 10 MG tablet Take 1 tablet (10 mg total) by mouth daily. 30 tablet 2  . losartan (COZAAR) 50 MG tablet Take 1 tablet (50 mg total) by mouth daily. 90 tablet 0  . metoprolol tartrate (LOPRESSOR) 25 MG tablet Take 0.5 tablets (12.5 mg total) by mouth  2 (two) times daily. For hypertension 180 tablet 3  . hydrALAZINE (APRESOLINE) 50 MG tablet Take 1 tablet (50 mg total) by mouth 2 (two) times daily. for hypertension 60 tablet 3   No facility-administered medications prior to visit.     Review of Systems;  Patient denies headache, fevers, malaise, unintentional weight loss, skin rash, eye pain, sinus congestion and sinus pain, sore throat, dysphagia,  hemoptysis , cough, dyspnea, wheezing, chest pain, palpitations, orthopnea, edema, abdominal pain, nausea, melena, diarrhea, constipation, flank pain, dysuria, hematuria, urinary  Frequency, nocturia, numbness, tingling, seizures,  Focal  weakness, Loss of consciousness,  Tremor, insomnia, depression, anxiety, and suicidal ideation.      Objective:  BP (!) 140/58 (BP Location: Left Arm, Patient Position: Sitting, Cuff Size: Normal)   Pulse (!) 57   Temp 98.1 F (36.7 C) (Oral)   Resp 15   Ht 5' (1.524 m)   Wt 142 lb 12.8 oz (64.8 kg)   SpO2 97%   BMI 27.89 kg/m   BP Readings from Last 3 Encounters:  01/29/18 (!) 140/58  01/04/18 110/60  09/29/17 (!) 120/50    Wt Readings from Last 3 Encounters:  01/29/18 142 lb 12.8 oz (64.8 kg)  01/04/18 144 lb (65.3 kg)  09/29/17 145 lb (65.8 kg)    General appearance: alert, cooperative and appears stated age Ears: normal TM's and external ear canals both ears Throat: lips, mucosa, and tongue normal; teeth and gums normal Neck: no adenopathy, no carotid bruit, supple, symmetrical, trachea midline and thyroid not enlarged, symmetric, no tenderness/mass/nodules Back: symmetric, no curvature. ROM normal. No CVA tenderness. Lungs: clear to auscultation bilaterally Heart: regular rate and rhythm, S1, S2 normal, no murmur, click, rub or gallop Abdomen: soft, non-tender; bowel sounds normal; no masses,  no organomegaly Pulses: 2+ and symmetric Skin: Skin color, texture, turgor normal. No rashes or lesions Lymph nodes: Cervical, supraclavicular, and axillary nodes normal.  Lab Results  Component Value Date   HGBA1C 5.0 06/16/2016    Lab Results  Component Value Date   CREATININE 2.2 (A) 09/27/2017   CREATININE 2.16 (H) 09/21/2017   CREATININE 2.39 (H) 09/20/2017    Lab Results  Component Value Date   WBC 7.0 09/20/2017   HGB 9.2 (L) 09/20/2017   HCT 27.7 (L) 09/20/2017   PLT 222 09/20/2017   GLUCOSE 127 (H) 09/21/2017   CHOL 124 06/16/2016   TRIG 87.0 06/16/2016   HDL 42.50 06/16/2016   LDLCALC 64 06/16/2016   ALT 9 08/14/2017   AST 13 08/14/2017   NA 140 09/27/2017   K 3.9 09/27/2017   CL 110 09/21/2017   CREATININE 2.2 (A) 09/27/2017   BUN 69 (A)  09/27/2017   CO2 21 (L) 09/21/2017   TSH 2.310 01/26/2017   INR 1.48 04/18/2017   HGBA1C 5.0 06/16/2016    No results found.  Assessment & Plan:   Problem List Items Addressed This Visit    Depression, major, single episode, moderate ([place])    encouraged to resume lexapro.       Relevant Medications   escitalopram (LEXAPRO) 10 MG tablet   Dyspnea on exertion    Multifactorial .all factors except deconditioning have been optimized.   cardiopulmonary rehab referral offered and accepted         A total of 40 minutes of face to face time was spent with patient more than half of which was spent in counselling and coordination of care   I have changed Onnie Boer. Wichert's  escitalopram. I am also having her maintain her acetaminophen, fluticasone, PROAIR HFA, OPTICHAMBER ADVANTAGE, Vitamin D-3, apixaban, vitamin B-12, benzonatate, ipratropium-albuterol, budesonide, omeprazole, levothyroxine, metoprolol tartrate, furosemide, ALPRAZolam, temazepam, losartan, albuterol, and hydrALAZINE.  Meds ordered this encounter  Medications  . hydrALAZINE (APRESOLINE) 50 MG tablet    Sig: Take 1 tablet (50 mg total) by mouth 2 (two) times daily. for hypertension    Dispense:  180 tablet    Refill:  3  . escitalopram (LEXAPRO) 10 MG tablet    Sig: Take 1 tablet (10 mg total) by mouth daily. After dinner    Dispense:  90 tablet    Refill:  1    Medications Discontinued During This Encounter  Medication Reason  . hydrALAZINE (APRESOLINE) 50 MG tablet   . escitalopram (LEXAPRO) 10 MG tablet Reorder    Follow-up: Return in about 3 months (around 05/01/2018) for depression/anxiety.   [name], MD

## 2018-01-29 NOTE — Patient Instructions (Addendum)
I want you to resume generic Lexapro for your anxiety and depression  Start it with 1/2 tablet after dinner for the first few days .  Continue restoril as needed for insomnia   I am going to to try  for an enrollment in the Cardiopulmonary Rehab program at [facility] to improve your dyspnea    Talk to your insurance agent about your next medicare enrollment you need to have the one for expensive medications

## 2018-01-30 NOTE — Assessment & Plan Note (Signed)
Multifactorial .all factors except deconditioning have been optimized.   cardiopulmonary rehab referral offered and accepted

## 2018-01-30 NOTE — Assessment & Plan Note (Signed)
encouraged to resume lexapro.

## 2018-02-06 ENCOUNTER — Other Ambulatory Visit: Payer: Self-pay

## 2018-02-06 NOTE — Telephone Encounter (Signed)
Refilled: 11/29/2017 Last OV: 01/29/2018 Next OV: 05/01/2018

## 2018-02-06 NOTE — Telephone Encounter (Signed)
Refilled: 10/25/2017 Last OV: 01/29/2018 Next OV: 05/01/2018

## 2018-02-07 MED ORDER — TEMAZEPAM 15 MG PO CAPS: ORAL_CAPSULE | ORAL | 2 refills | Status: DC

## 2018-02-07 MED ORDER — ALPRAZOLAM 0.5 MG PO TABS: 0.5000 mg | ORAL_TABLET | Freq: Two times a day (BID) | ORAL | 5 refills | Status: DC | PRN

## 2018-02-07 MED ORDER — ALPRAZOLAM 0.5 MG PO TABS: ORAL_TABLET | ORAL | 5 refills | Status: DC

## 2018-02-07 NOTE — Telephone Encounter (Signed)
Spoke with pt and she stated that she is not taking these at the same time. She stated that she takes a half a tablet of alprazolam after supper if needed and then if she needs it she will take the temazepam at bedtime. She stated that if she takes one she doesn't take the other.   Rxs printed, signed and faxed.

## 2018-02-07 NOTE — Telephone Encounter (Signed)
I do not want her to take alprazolam and restoril at the same time.  Is she currently takign both medications at the same timr or splitting them up

## 2018-02-07 NOTE — Telephone Encounter (Signed)
Patient called and states she just talked to a nurse at the office and she would like a call back again She states it is about medications.  CB# [phone]

## 2018-02-12 ENCOUNTER — Other Ambulatory Visit: Payer: Self-pay

## 2018-02-12 MED ORDER — APIXABAN 2.5 MG PO TABS: 2.5000 mg | ORAL_TABLET | Freq: Two times a day (BID) | ORAL | 3 refills | Status: AC

## 2018-02-19 ENCOUNTER — Ambulatory Visit: Payer: PPO

## 2018-02-20 ENCOUNTER — Ambulatory Visit: Payer: PPO

## 2018-02-23 ENCOUNTER — Encounter: Payer: Self-pay | Admitting: Cardiovascular Disease

## 2018-02-23 ENCOUNTER — Encounter

## 2018-02-23 ENCOUNTER — Ambulatory Visit (INDEPENDENT_AMBULATORY_CARE_PROVIDER_SITE_OTHER): Payer: PPO | Admitting: Cardiovascular Disease

## 2018-02-23 VITALS — BP 120/64 | HR 49 | Ht 60.0 in | Wt 140.5 lb

## 2018-02-23 DIAGNOSIS — I10 Essential (primary) hypertension: Secondary | ICD-10-CM | POA: Diagnosis not present

## 2018-02-23 DIAGNOSIS — I481 Persistent atrial fibrillation: Secondary | ICD-10-CM

## 2018-02-23 DIAGNOSIS — I25118 Atherosclerotic heart disease of native coronary artery with other forms of angina pectoris: Secondary | ICD-10-CM

## 2018-02-23 DIAGNOSIS — I5032 Chronic diastolic (congestive) heart failure: Secondary | ICD-10-CM

## 2018-02-23 DIAGNOSIS — I4819 Other persistent atrial fibrillation: Secondary | ICD-10-CM

## 2018-02-23 NOTE — Progress Notes (Signed)
Cardiology Office Note   Date:  02/23/2018   ID:  Savannah Wilson, DOB 1929/05/07, MRN [id number]  PCP:  Savannah Mc, MD  Cardiologist:   Savannah Sacramento, MD   Chief Complaint  Patient presents with  . other    4 month f/u c/o sob. Meds reviewed verbally with pt.      History of Present Illness: Savannah Wilson is a 84 y.o. female who presents for a follow-up visit regarding paroxysmal atrial fibrillation and chronic diastolic heart failure. She has known history of stage IV chronic kidney disease, asthma/COPD, essential hypertension, hypothyroidism, anemia of chronic disease and Barrett's esophagus. She underwent successful cardioversion to sinus rhythm in August, 2018. She presented to [facility] in September with worsening shortness of breath. She was found to be volume overloaded with severe hypertension on presentation. She slowly improved with diuresis which was somewhat difficult due to underlying chronic kidney disease. She had a repeat echocardiogram done which showed normal LV systolic function with inferior wall hypokinesis. There was moderate mitral regurgitation and moderate pulmonary hypertension with peak systolic pressure of 52 mmHg. She underwent a pharmacologic nuclear stress test with Lexiscan which showed evidence of prior inferior/inferolateral infarct with minimal peri-infarct ischemia. The patient had severe hypotension with Lexiscan.  She is being treated medically for presumed underlying coronary artery disease.  She has been doing reasonably well with no recent chest pain.  She continues to have exertional dyspnea.  Leg edema improved on furosemide.  She does complain of dizziness and lightheadedness especially at night when she tries to lie down.  She is noted to be frequently bradycardic with heart rate below 50.    Past Medical History:  Diagnosis Date  . Asthma   . Barrett esophagus   . Chronic bronchitis   . Chronic kidney disease (CKD), stage IV (severe)  ([place])   . Depression   . Diverticulosis   . GERD (gastroesophageal reflux disease)   . Gout   . Hyperlipidemia   . Hypertension   . Hypothyroid   . Mitral regurgitation    a. TTE 02/2017: EF 55-60%, normal WM, calcified mitral annulus with mod regurg, moderately dilated LA, unable to estimate PASP  . Pancreatitis due to biliary obstruction March 2010   s/p ERCP sphincterotomy, cholecystectomy  . Peripheral neuralgia   . Persistent atrial fibrillation (Savannah Wilson)    a. diagnosed 01/24/17; CHADS2VASc => 5 (HTN, age x 2, vascular disease with PAD and aortic plaque, female) giving her an estimated annual stroke risk of 6.7%; b. successful DCCV 03/28/2017; c. on Eliquis  . Venous insufficiency     Past Surgical History:  Procedure Laterality Date  . ABDOMINAL HYSTERECTOMY    . CARDIOVERSION N/A 03/28/2017   Procedure: CARDIOVERSION;  Surgeon: Nelva Bush, MD;  Location: [facility];  Service: Cardiovascular;  Laterality: N/A;  . CARPAL TUNNEL RELEASE  jan 2013   Savannah Wilson  . CATARACT EXTRACTION     right  . CATARACT EXTRACTION  2008  . CHOLECYSTECTOMY  02/10  . CHOLECYSTECTOMY  2010  . TONSILLECTOMY    . VENTRAL HERNIA REPAIR    . VENTRAL HERNIA REPAIR  2007  . VESICOVAGINAL FISTULA CLOSURE W/ TAH       Current Outpatient Medications  Medication Sig Dispense Refill  . acetaminophen (TYLENOL) 650 MG CR tablet Take 650 mg by mouth every 8 (eight) hours as needed for pain.     Marland Kitchen ALPRAZolam (XANAX) 0.5 MG tablet Take 1 tablet (0.5 mg  total) by mouth 2 (two) times daily as needed for anxiety. 45 tablet 5  . apixaban (ELIQUIS) 2.5 MG TABS tablet Take 1 tablet (2.5 mg total) by mouth 2 (two) times daily. 180 tablet 3  . benzonatate (TESSALON) 200 MG capsule Take 1 capsule (200 mg total) by mouth 3 (three) times daily as needed for cough. 60 capsule 1  . budesonide (PULMICORT) 0.25 MG/2ML nebulizer solution Take 2 mLs (0.25 mg total) by nebulization 2 (two) times daily. 60 mL 12  .  Cholecalciferol (VITAMIN D-3) 1000 units CAPS Take 1,000 Units by mouth daily.     Marland Kitchen escitalopram (LEXAPRO) 10 MG tablet Take 1 tablet (10 mg total) by mouth daily. After dinner 90 tablet 1  . fluticasone (FLONASE) 50 MCG/ACT nasal spray Place 2 sprays into the nose daily. (Patient taking differently: Place 2 sprays into the nose daily as needed (For allergies.). ) 16 g 6  . furosemide (LASIX) 40 MG tablet Take 40 mg by mouth daily.    . hydrALAZINE (APRESOLINE) 50 MG tablet Take 1 tablet (50 mg total) by mouth 2 (two) times daily. for hypertension 180 tablet 3  . ipratropium-albuterol (DUONEB) 0.5-2.5 (3) MG/3ML SOLN Take 3 mLs by nebulization every 6 (six) hours as needed. 360 mL 0  . levothyroxine (SYNTHROID, LEVOTHROID) 100 MCG tablet Take 1 tablet (100 mcg total) by mouth daily. For thyroid 90 tablet 0  . omeprazole (PRILOSEC) 40 MG capsule TAKE ONE CAPSULE BY MOUTH TWICE A DAY. BEFORE BREAKFAST AND SUPPER 180 capsule 1  . PROAIR HFA 108 (90 BASE) MCG/ACT inhaler INHALE 2 PUFFS EVERY 6 HOURS AS NEEDED FOR WHEEZING 8.5 g 3  . Spacer/Aero-Holding Chambers (OPTICHAMBER ADVANTAGE) MISC 1 each by Other route once. Always uses her when you're using a metered-dose inhaler. You've aromatase medicine as much, he won't have his much side effect, but you it twice as much medicine and your lungs. 1 each 0  . temazepam (RESTORIL) 15 MG capsule TAKE 1 CAPSULE BY MOUTH AT BEDTIME AS NEEDED FOR SLEEP 30 capsule 2  . vitamin B-12 (CYANOCOBALAMIN) 1000 MCG tablet Take 1,000 mcg by mouth daily.    Marland Kitchen losartan (COZAAR) 50 MG tablet Take 1 tablet (50 mg total) by mouth daily. 90 tablet 0  . metoprolol tartrate (LOPRESSOR) 25 MG tablet Take 0.5 tablets (12.5 mg total) by mouth 2 (two) times daily. For hypertension 180 tablet 3   No current facility-administered medications for this visit.     Allergies:   Ace inhibitors; Benicar [olmesartan medoxomil]; Clarithromycin; Clarithromycin; Norvasc [amlodipine besylate];  Levofloxacin; and Zithromax [azithromycin dihydrate]    Social History:  The patient  reports that she has never smoked. She has never used smokeless tobacco. She reports that she does not drink alcohol or use drugs.   Family History:  The patient's family history includes Cancer in her daughter; Heart disease in her father; Hypertension in her mother; Kidney cancer in her father; Kidney disease in her daughter and mother; Kidney failure in her mother; Multiple myeloma in her daughter; Rheumatologic disease in her father.    ROS:  Please see the history of present illness.   Otherwise, review of systems are positive for none.   All other systems are reviewed and negative.    PHYSICAL EXAM: VS:  BP 120/64 (BP Location: Left Arm, Patient Position: Sitting, Cuff Size: Normal)   Pulse (!) 49   Ht 5' (1.524 m)   Wt 140 lb 8 oz (63.7 kg)   BMI  27.44 kg/m  , BMI Body mass index is 27.44 kg/m. GEN: Well nourished, well developed, in no acute distress  HEENT: normal  Neck: no JVD, carotid bruits, or masses Cardiac: Regular rate and rhythm; no murmurs, rubs, or gallops, trace bilateral edema  Respiratory:  clear to auscultation bilaterally, normal work of breathing GI: soft, nontender, nondistended, + BS MS: no deformity or atrophy  Skin: warm and dry, no rash Neuro:  Strength and sensation are intact Psych: euthymic mood, full affect   EKG:  EKG is ordered today. The ekg ordered today demonstrates sinus bradycardia with a PVC and low voltage.   Recent Labs: 04/15/2017: B Natriuretic Peptide 1,460.0 08/14/2017: ALT 9; Pro B Natriuretic peptide (BNP) 473.0 09/20/2017: Hemoglobin 9.2; Platelets 222 09/21/2017: Magnesium 1.7 09/27/2017: BUN 69; Creatinine 2.2; Potassium 3.9; Sodium 140    Lipid Panel    Component Value Date/Time   CHOL 124 06/16/2016 0945   TRIG 87.0 06/16/2016 0945   HDL 42.50 06/16/2016 0945   CHOLHDL 3 06/16/2016 0945   VLDL 17.4 06/16/2016 0945   LDLCALC 64  06/16/2016 0945      Wt Readings from Last 3 Encounters:  02/23/18 140 lb 8 oz (63.7 kg)  01/29/18 142 lb 12.8 oz (64.8 kg)  01/04/18 144 lb (65.3 kg)      PAD Screen 01/26/2017  Previous PAD dx? No  Previous surgical procedure? No  Pain with walking? No  Feet/toe relief with dangling? No  Painful, non-healing ulcers? No  Extremities discolored? No      ASSESSMENT AND PLAN:  1.  Persistent atrial fibrillation: She remains in sinus rhythm.  She is mildly bradycardic and she reports recent episodes of dizziness mostly at night.  I elected to stop small dose metoprolol.  If she develops recurrent atrial fibrillation, we might need to consider an antiarrhythmic medication such as amiodarone. Continue long-term anticoagulation with low-dose Eliquis.  2. Chronic diastolic heart failure with moderate pulmonary hypertension: She appears to be euvolemic on current dose of furosemide.  3. Coronary artery disease involving native coronary arteries with other forms of angina: Nuclear stress test showed evidence of prior inferior and inferolateral infarct with minimal ischemia.  Given her age, advanced chronic kidney disease and lack of anginal symptoms, I recommend continuing medical therapy.  4. Essential hypertension: Blood pressure is controlled on current medications.  5. Chronic kidney disease: Stable and followed by nephrology.   Disposition:   FU with me in 4 months  Signed,  Savannah Sacramento, MD  02/23/2018 11:13 AM    [place]

## 2018-02-23 NOTE — Patient Instructions (Signed)
Medication Instructions: STOP the Metoprolol  If you need a refill on your cardiac medications before your next appointment, please call your pharmacy.   Follow-Up: Your physician wants you to follow-up in 4 months with Dr. Fletcher Anon. You will receive a reminder letter in the mail two months in advance. If you don't receive a letter, please call our office at [phone] to schedule this follow-up appointment.   Thank you for choosing [facility] at [facility]!

## 2018-02-26 ENCOUNTER — Telehealth: Payer: Self-pay | Admitting: *Deleted

## 2018-02-26 NOTE — Telephone Encounter (Signed)
Copied from [place] [id number]. Topic: Referral - Status >> Feb 26, 2018  1:33 PM Bea Graff, NT wrote: Reason for CRM: Pt calling and asking Juliann Pulse to call her in regards to the referral for the rehab program Dr. Derrel Nip referred her to.

## 2018-02-27 NOTE — Telephone Encounter (Signed)
Ok I will talk to her daugheter

## 2018-02-27 NOTE — Telephone Encounter (Signed)
Left message for patient to return call to office. 

## 2018-02-27 NOTE — Telephone Encounter (Signed)
Patient cannot  Afford to go to rehab her Co- pay is just to high right now and she cannot afford to go for pulmonary rehab.

## 2018-02-28 DIAGNOSIS — D631 Anemia in chronic kidney disease: Secondary | ICD-10-CM | POA: Diagnosis not present

## 2018-02-28 DIAGNOSIS — R601 Generalized edema: Secondary | ICD-10-CM | POA: Diagnosis not present

## 2018-02-28 DIAGNOSIS — N184 Chronic kidney disease, stage 4 (severe): Secondary | ICD-10-CM | POA: Diagnosis not present

## 2018-02-28 DIAGNOSIS — R809 Proteinuria, unspecified: Secondary | ICD-10-CM | POA: Diagnosis not present

## 2018-02-28 DIAGNOSIS — I129 Hypertensive chronic kidney disease with stage 1 through stage 4 chronic kidney disease, or unspecified chronic kidney disease: Secondary | ICD-10-CM | POA: Diagnosis not present

## 2018-02-28 NOTE — Telephone Encounter (Signed)
Noted  

## 2018-03-06 ENCOUNTER — Encounter: Payer: PPO | Attending: Internal Medicine

## 2018-03-06 ENCOUNTER — Other Ambulatory Visit: Payer: Self-pay

## 2018-03-06 VITALS — Ht 60.0 in | Wt 142.9 lb

## 2018-03-06 DIAGNOSIS — E785 Hyperlipidemia, unspecified: Secondary | ICD-10-CM | POA: Diagnosis not present

## 2018-03-06 DIAGNOSIS — I129 Hypertensive chronic kidney disease with stage 1 through stage 4 chronic kidney disease, or unspecified chronic kidney disease: Secondary | ICD-10-CM | POA: Diagnosis not present

## 2018-03-06 DIAGNOSIS — N184 Chronic kidney disease, stage 4 (severe): Secondary | ICD-10-CM | POA: Diagnosis not present

## 2018-03-06 DIAGNOSIS — Z79899 Other long term (current) drug therapy: Secondary | ICD-10-CM | POA: Insufficient documentation

## 2018-03-06 DIAGNOSIS — K219 Gastro-esophageal reflux disease without esophagitis: Secondary | ICD-10-CM | POA: Insufficient documentation

## 2018-03-06 DIAGNOSIS — R0609 Other forms of dyspnea: Secondary | ICD-10-CM | POA: Insufficient documentation

## 2018-03-06 DIAGNOSIS — Z79890 Hormone replacement therapy (postmenopausal): Secondary | ICD-10-CM | POA: Insufficient documentation

## 2018-03-06 NOTE — Progress Notes (Signed)
Daily Session Note  Patient Details  Name: Savannah Wilson MRN: [id number] Date of Birth: [birth year] Referring Provider:     Pulmonary Rehab from 03/06/2018 in [facility]  Referring Provider  Derrel Nip      Encounter Date: 03/06/2018  Check In: Session Check In - 03/06/18 1110      Check-In   Supervising physician immediately available to respond to emergencies  [facility] immediately available ER MD    Physician(s)  Dr. Quentin Cornwall and [facility]    Location  [facility]-Cardiac & Pulmonary Rehab    Staff Present  Justin Mend RCP,RRT,BSRT;Amanda Oletta Darter, [place], ACSM CEP, Exercise Physiologist    Medication changes reported      No    Fall or balance concerns reported     No    Warm-up and Cool-down  Not performed (comment) medical evaluation    Resistance Training Performed  No    VAD Patient?  No      Pain Assessment   Currently in Pain?  No/denies          Social History   Tobacco Use  Smoking Status Never Smoker  Smokeless Tobacco Never Used    Goals Met:  Exercise tolerated well Personal goals reviewed No report of cardiac concerns or symptoms Strength training completed today  Goals Unmet:  Not Applicable  Comments:  [place] Name 03/06/18 1219         6 Minute Walk   Distance  610 feet     Walk Time  5.6 minutes     # of Rest Breaks  1     MPH  1.23     METS  0.6     RPE  13     Perceived Dyspnea   3     VO2 Peak  2.1     Symptoms  No     Resting HR  67 bpm     Resting BP  138/68     Resting Oxygen Saturation   97 %     Exercise Oxygen Saturation  during 6 min walk  95 %     Max Ex. HR  92 bpm     Max Ex. BP  138/60     2 Minute Post BP  130/64       Interval HR   1 Minute HR  86     2 Minute HR  89     3 Minute HR  92     4 Minute HR  84     5 Minute HR  88     6 Minute HR  88     2 Minute Post HR  64     Interval Heart Rate?  Yes       Interval Oxygen   Interval Oxygen?  Yes     Baseline Oxygen Saturation %  97 %      1 Minute Oxygen Saturation %  95 %     1 Minute Liters of Oxygen  0 L     2 Minute Oxygen Saturation %  95 %     2 Minute Liters of Oxygen  0 L     3 Minute Oxygen Saturation %  96 %     3 Minute Liters of Oxygen  0 L     4 Minute Oxygen Saturation %  96 %     4 Minute Liters of Oxygen  0 L  5 Minute Oxygen Saturation %  95 %     5 Minute Liters of Oxygen  0 L     6 Minute Oxygen Saturation %  96 %     6 Minute Liters of Oxygen  0 L     2 Minute Post Oxygen Saturation %  97 %     2 Minute Post Liters of Oxygen  0 L      Service time 1050-1200.   Dr. Mark Miller is Medical Director for [facility] and [facility]. 

## 2018-03-06 NOTE — Patient Instructions (Signed)
Patient Instructions  Patient Details  Name: Savannah Wilson MRN: [id number] Date of Birth: Dec 20, 1928 Referring Provider:  [name], MD  Below are your personal goals for exercise, nutrition, and risk factors. Our goal is to help you stay on track towards obtaining and maintaining these goals. We will be discussing your progress on these goals with you throughout the program.  Initial Exercise Prescription: Initial Exercise Prescription - 03/06/18 1200      Date of Initial Exercise RX and Referring Provider   Date  03/06/18    Referring Provider  [name]      Treadmill   MPH  1    Grade  0    Minutes  14    METs  1.7      NuStep   Level  1    SPM  80    Minutes  15    METs  1.5      Biostep-RELP   Level  1    SPM  50    Minutes  15    METs  1.5      Prescription Details   Frequency (times per week)  2    Duration  Progress to 45 minutes of aerobic exercise without signs/symptoms of physical distress      Intensity   THRR 40-80% of Max Heartrate  94-121    Ratings of Perceived Exertion  11-13    Perceived Dyspnea  0-4      Resistance Training   Training Prescription  Yes    Weight  2 lb    Reps  10-15       Exercise Goals: Frequency: Be able to perform aerobic exercise two to three times per week in program working toward 2-5 days per week of home exercise.  Intensity: Work with a perceived exertion of 11 (fairly light) - 15 (hard) while following your exercise prescription.  We will make changes to your prescription with you as you progress through the program.   Duration: Be able to do 30 to 45 minutes of continuous aerobic exercise in addition to a 5 minute warm-up and a 5 minute cool-down routine.   Nutrition Goals: Your personal nutrition goals will be established when you do your nutrition analysis with the dietician.  The following are general nutrition guidelines to follow: Cholesterol < 200mg /day Sodium < 1500mg /day Fiber: Women over 50 yrs -  21 grams per day  Personal Goals: Personal Goals and Risk Factors at Admission - 03/06/18 1146      Core Components/Risk Factors/Patient Goals on Admission    Weight Management  Yes;Weight Maintenance;Weight Loss    Intervention  Weight Management: Develop a combined nutrition and exercise program designed to reach desired caloric intake, while maintaining appropriate intake of nutrient and fiber, sodium and fats, and appropriate energy expenditure required for the weight goal.;Weight Management: Provide education and appropriate resources to help participant work on and attain dietary goals.;Weight Management/Obesity: Establish reasonable short term and long term weight goals.    Admit Weight  142 lb 14.4 oz (64.8 kg)    Goal Weight: Short Term  135 lb (61.2 kg)    Goal Weight: Long Term  135 lb (61.2 kg)    Expected Outcomes  Short Term: Continue to assess and modify interventions until short term weight is achieved;Long Term: Adherence to nutrition and physical activity/exercise program aimed toward attainment of established weight goal;Weight Maintenance: Understanding of the daily nutrition guidelines, which includes 25-35% calories from fat, 7% or  less cal from saturated fats, less than 200mg  cholesterol, less than 1.5gm of sodium, & 5 or more servings of fruits and vegetables daily;Understanding recommendations for meals to include 15-35% energy as protein, 25-35% energy from fat, 35-60% energy from carbohydrates, less than 200mg  of dietary cholesterol, 20-35 gm of total fiber daily;Understanding of distribution of calorie intake throughout the day with the consumption of 4-5 meals/snacks;Weight Loss: Understanding of general recommendations for a balanced deficit meal plan, which promotes 1-2 lb weight loss per week and includes a negative energy balance of [phone] kcal/d    Improve shortness of breath with ADL's  Yes    Intervention  Provide education, individualized exercise plan and daily  activity instruction to help decrease symptoms of SOB with activities of daily living.    Expected Outcomes  Short Term: Improve cardiorespiratory fitness to achieve a reduction of symptoms when performing ADLs;Long Term: Be able to perform more ADLs without symptoms or delay the onset of symptoms    Hypertension  Yes    Intervention  Provide education on lifestyle modifcations including regular physical activity/exercise, weight management, moderate sodium restriction and increased consumption of fresh fruit, vegetables, and low fat dairy, alcohol moderation, and smoking cessation.;Monitor prescription use compliance.    Expected Outcomes  Short Term: Continued assessment and intervention until BP is < 140/23mm HG in hypertensive participants. < 130/9mm HG in hypertensive participants with diabetes, heart failure or chronic kidney disease.;Long Term: Maintenance of blood pressure at goal levels.       Tobacco Use Initial Evaluation: Social History   Tobacco Use  Smoking Status Never Smoker  Smokeless Tobacco Never Used    Exercise Goals and Review: Exercise Goals    Row Name 03/06/18 1218             Exercise Goals   Increase Physical Activity  Yes       Intervention  Provide advice, education, support and counseling about physical activity/exercise needs.;Develop an individualized exercise prescription for aerobic and resistive training based on initial evaluation findings, risk stratification, comorbidities and participant's personal goals.       Expected Outcomes  Short Term: Attend rehab on a regular basis to increase amount of physical activity.;Long Term: Add in home exercise to make exercise part of routine and to increase amount of physical activity.;Long Term: Exercising regularly at least 3-5 days a week.       Increase Strength and Stamina  Yes       Intervention  Provide advice, education, support and counseling about physical activity/exercise needs.;Develop an  individualized exercise prescription for aerobic and resistive training based on initial evaluation findings, risk stratification, comorbidities and participant's personal goals.       Expected Outcomes  Short Term: Increase workloads from initial exercise prescription for resistance, speed, and METs.;Short Term: Perform resistance training exercises routinely during rehab and add in resistance training at home;Long Term: Improve cardiorespiratory fitness, muscular endurance and strength as measured by increased METs and functional capacity (6MWT)       Able to understand and use rate of perceived exertion (RPE) scale  Yes       Intervention  Provide education and explanation on how to use RPE scale       Expected Outcomes  Short Term: Able to use RPE daily in rehab to express subjective intensity level;Long Term:  Able to use RPE to guide intensity level when exercising independently       Able to understand and use Dyspnea scale  Yes  Intervention  Provide education and explanation on how to use Dyspnea scale       Expected Outcomes  Short Term: Able to use Dyspnea scale daily in rehab to express subjective sense of shortness of breath during exertion;Long Term: Able to use Dyspnea scale to guide intensity level when exercising independently       Knowledge and understanding of Target Heart Rate Range (THRR)  Yes       Intervention  Provide education and explanation of THRR including how the numbers were predicted and where they are located for reference       Expected Outcomes  Short Term: Able to state/look up THRR;Short Term: Able to use daily as guideline for intensity in rehab;Long Term: Able to use THRR to govern intensity when exercising independently       Able to check pulse independently  Yes       Intervention  Provide education and demonstration on how to check pulse in carotid and radial arteries.;Review the importance of being able to check your own pulse for safety during  independent exercise       Expected Outcomes  Short Term: Able to explain why pulse checking is important during independent exercise;Long Term: Able to check pulse independently and accurately       Understanding of Exercise Prescription  Yes       Intervention  Provide education, explanation, and written materials on patient's individual exercise prescription       Expected Outcomes  Short Term: Able to explain program exercise prescription;Long Term: Able to explain home exercise prescription to exercise independently          Copy of goals given to participant.

## 2018-03-06 NOTE — Progress Notes (Signed)
Pulmonary Individual Treatment Plan  Patient Details  Name: Savannah Wilson MRN: [id number] Date of Birth: 1929-01-28 Referring Provider:     Pulmonary Rehab from 03/06/2018 in [facility] Cardiac and Pulmonary Rehab  Referring Provider  [name]      Initial Encounter Date:    Pulmonary Rehab from 03/06/2018 in [facility] Cardiac and Pulmonary Rehab  Date  03/06/18      Visit Diagnosis: Dyspnea on exertion  Patient's Home Medications on Admission:  Current Outpatient Medications:  .  acetaminophen (TYLENOL) 650 MG CR tablet, Take 650 mg by mouth every 8 (eight) hours as needed for pain. , Disp: , Rfl:  .  ALPRAZolam (XANAX) 0.5 MG tablet, Take 1 tablet (0.5 mg total) by mouth 2 (two) times daily as needed for anxiety., Disp: 45 tablet, Rfl: 5 .  apixaban (ELIQUIS) 2.5 MG TABS tablet, Take 1 tablet (2.5 mg total) by mouth 2 (two) times daily., Disp: 180 tablet, Rfl: 3 .  benzonatate (TESSALON) 200 MG capsule, Take 1 capsule (200 mg total) by mouth 3 (three) times daily as needed for cough., Disp: 60 capsule, Rfl: 1 .  budesonide (PULMICORT) 0.25 MG/2ML nebulizer solution, Take 2 mLs (0.25 mg total) by nebulization 2 (two) times daily., Disp: 60 mL, Rfl: 12 .  Cholecalciferol (VITAMIN D-3) 1000 units CAPS, Take 1,000 Units by mouth daily. , Disp: , Rfl:  .  escitalopram (LEXAPRO) 10 MG tablet, Take 1 tablet (10 mg total) by mouth daily. After dinner, Disp: 90 tablet, Rfl: 1 .  fluticasone (FLONASE) 50 MCG/ACT nasal spray, Place 2 sprays into the nose daily. (Patient taking differently: Place 2 sprays into the nose daily as needed (For allergies.). ), Disp: 16 g, Rfl: 6 .  furosemide (LASIX) 40 MG tablet, Take 40 mg by mouth daily., Disp: , Rfl:  .  hydrALAZINE (APRESOLINE) 50 MG tablet, Take 1 tablet (50 mg total) by mouth 2 (two) times daily. for hypertension, Disp: 180 tablet, Rfl: 3 .  ipratropium-albuterol (DUONEB) 0.5-2.5 (3) MG/3ML SOLN, Take 3 mLs by nebulization every 6 (six) hours as needed.,  Disp: 360 mL, Rfl: 0 .  levothyroxine (SYNTHROID, LEVOTHROID) 100 MCG tablet, Take 1 tablet (100 mcg total) by mouth daily. For thyroid, Disp: 90 tablet, Rfl: 0 .  losartan (COZAAR) 50 MG tablet, Take 1 tablet (50 mg total) by mouth daily., Disp: 90 tablet, Rfl: 0 .  omeprazole (PRILOSEC) 40 MG capsule, TAKE ONE CAPSULE BY MOUTH TWICE A DAY. BEFORE BREAKFAST AND SUPPER, Disp: 180 capsule, Rfl: 1 .  PROAIR HFA 108 (90 BASE) MCG/ACT inhaler, INHALE 2 PUFFS EVERY 6 HOURS AS NEEDED FOR WHEEZING, Disp: 8.5 g, Rfl: 3 .  Spacer/Aero-Holding Chambers (OPTICHAMBER ADVANTAGE) MISC, 1 each by Other route once. Always uses her when you're using a metered-dose inhaler. You've aromatase medicine as much, he won't have his much side effect, but you it twice as much medicine and your lungs., Disp: 1 each, Rfl: 0 .  temazepam (RESTORIL) 15 MG capsule, TAKE 1 CAPSULE BY MOUTH AT BEDTIME AS NEEDED FOR SLEEP, Disp: 30 capsule, Rfl: 2 .  vitamin B-12 (CYANOCOBALAMIN) 1000 MCG tablet, Take 1,000 mcg by mouth daily., Disp: , Rfl:   Past Medical History: Past Medical History:  Diagnosis Date  . Asthma   . Barrett esophagus   . Chronic bronchitis   . Chronic kidney disease (CKD), stage IV (severe) ([place])   . Depression   . Diverticulosis   . GERD (gastroesophageal reflux disease)   . Gout   .  Hyperlipidemia   . Hypertension   . Hypothyroid   . Mitral regurgitation    a. TTE 02/2017: EF 55-60%, normal WM, calcified mitral annulus with mod regurg, moderately dilated LA, unable to estimate PASP  . Pancreatitis due to biliary obstruction March 2010   s/p ERCP sphincterotomy, cholecystectomy  . Peripheral neuralgia   . Persistent atrial fibrillation ([place])    a. diagnosed 01/24/17; CHADS2VASc => 5 (HTN, age x 2, vascular disease with PAD and aortic plaque, female) giving her an estimated annual stroke risk of 6.7%; b. successful DCCV 03/28/2017; c. on Eliquis  . Venous insufficiency     Tobacco Use: Social History    Tobacco Use  Smoking Status Never Smoker  Smokeless Tobacco Never Used    Labs: Recent Review Flowsheet Data    Labs for ITP Cardiac and Pulmonary Rehab Latest Ref Rng & Units 10/28/2013 06/16/2016   Cholestrol 0 - 200 mg/dL 116 124   LDLCALC 0 - 99 mg/dL 58 64   HDL >39.00 mg/dL 26.30(L) 42.50   Trlycerides 0.0 - 149.0 mg/dL 159.0(H) 87.0   Hemoglobin A1c 4.6 - 6.5 % - 5.0       Pulmonary Assessment Scores: Pulmonary Assessment Scores    Row Name 03/06/18 1112         ADL UCSD   ADL Phase  Entry     SOB Score total  55     Rest  0     Walk  2     Stairs  4     Bath  1     Dress  1     Shop  4       CAT Score   CAT Score  17       mMRC Score   mMRC Score  2        Pulmonary Function Assessment: Pulmonary Function Assessment - 03/06/18 1140      Breath   Bilateral Breath Sounds  Clear    Shortness of Breath  Yes;Limiting activity       Exercise Target Goals: Date: 03/06/18  Exercise Program Goal: Individual exercise prescription set using results from initial 6 min walk test and THRR while considering  patient's activity barriers and safety.    Exercise Prescription Goal: Initial exercise prescription builds to 30-45 minutes a day of aerobic activity, 2-3 days per week.  Home exercise guidelines will be given to patient during program as part of exercise prescription that the participant will acknowledge.  Activity Barriers & Risk Stratification:   6 Minute Walk: 6 Minute Walk    Row Name 03/06/18 1219         6 Minute Walk   Distance  610 feet     Walk Time  5.6 minutes     # of Rest Breaks  1     MPH  1.23     METS  0.6     RPE  13     Perceived Dyspnea   3     VO2 Peak  2.1     Symptoms  No     Resting HR  67 bpm     Resting BP  138/68     Resting Oxygen Saturation   97 %     Exercise Oxygen Saturation  during 6 min walk  95 %     Max Ex. HR  92 bpm     Max Ex. BP  138/60     2 Minute Post BP  130/64       Interval HR   1  Minute HR  86     2 Minute HR  89     3 Minute HR  92     4 Minute HR  84     5 Minute HR  88     6 Minute HR  88     2 Minute Post HR  64     Interval Heart Rate?  Yes       Interval Oxygen   Interval Oxygen?  Yes     Baseline Oxygen Saturation %  97 %     1 Minute Oxygen Saturation %  95 %     1 Minute Liters of Oxygen  0 L     2 Minute Oxygen Saturation %  95 %     2 Minute Liters of Oxygen  0 L     3 Minute Oxygen Saturation %  96 %     3 Minute Liters of Oxygen  0 L     4 Minute Oxygen Saturation %  96 %     4 Minute Liters of Oxygen  0 L     5 Minute Oxygen Saturation %  95 %     5 Minute Liters of Oxygen  0 L     6 Minute Oxygen Saturation %  96 %     6 Minute Liters of Oxygen  0 L     2 Minute Post Oxygen Saturation %  97 %     2 Minute Post Liters of Oxygen  0 L       Oxygen Initial Assessment: Oxygen Initial Assessment - 03/06/18 1145      Home Oxygen   Home Oxygen Device  None    Sleep Oxygen Prescription  None    Home Exercise Oxygen Prescription  None    Home at Rest Exercise Oxygen Prescription  None      Initial 6 min Walk   Oxygen Used  None      Program Oxygen Prescription   Program Oxygen Prescription  None      Intervention   Short Term Goals  To learn and demonstrate proper use of respiratory medications;To learn and understand importance of maintaining oxygen saturations>88%;To learn and demonstrate proper pursed lip breathing techniques or other breathing techniques.;To learn and understand importance of monitoring SPO2 with pulse oximeter and demonstrate accurate use of the pulse oximeter.    Long  Term Goals  Verbalizes importance of monitoring SPO2 with pulse oximeter and return demonstration;Maintenance of O2 saturations>88%;Compliance with respiratory medication;Demonstrates proper use of MDI's;Exhibits proper breathing techniques, such as pursed lip breathing or other method taught during program session       Oxygen  Re-Evaluation:   Oxygen Discharge (Final Oxygen Re-Evaluation):   Initial Exercise Prescription: Initial Exercise Prescription - 03/06/18 1200      Date of Initial Exercise RX and Referring Provider   Date  03/06/18    Referring Provider  [name]      Treadmill   MPH  1    Grade  0    Minutes  14    METs  1.7      NuStep   Level  1    SPM  80    Minutes  15    METs  1.5      Biostep-RELP   Level  1    SPM  50    Minutes  15  METs  1.5      Prescription Details   Frequency (times per week)  2    Duration  Progress to 45 minutes of aerobic exercise without signs/symptoms of physical distress      Intensity   THRR 40-80% of Max Heartrate  94-121    Ratings of Perceived Exertion  11-13    Perceived Dyspnea  0-4      Resistance Training   Training Prescription  Yes    Weight  2 lb    Reps  10-15       Perform Capillary Blood Glucose checks as needed.  Exercise Prescription Changes:   Exercise Comments:   Exercise Goals and Review: Exercise Goals    Row Name 03/06/18 1218             Exercise Goals   Increase Physical Activity  Yes       Intervention  Provide advice, education, support and counseling about physical activity/exercise needs.;Develop an individualized exercise prescription for aerobic and resistive training based on initial evaluation findings, risk stratification, comorbidities and participant's personal goals.       Expected Outcomes  Short Term: Attend rehab on a regular basis to increase amount of physical activity.;Long Term: Add in home exercise to make exercise part of routine and to increase amount of physical activity.;Long Term: Exercising regularly at least 3-5 days a week.       Increase Strength and Stamina  Yes       Intervention  Provide advice, education, support and counseling about physical activity/exercise needs.;Develop an individualized exercise prescription for aerobic and resistive training based on initial evaluation  findings, risk stratification, comorbidities and participant's personal goals.       Expected Outcomes  Short Term: Increase workloads from initial exercise prescription for resistance, speed, and METs.;Short Term: Perform resistance training exercises routinely during rehab and add in resistance training at home;Long Term: Improve cardiorespiratory fitness, muscular endurance and strength as measured by increased METs and functional capacity (6MWT)       Able to understand and use rate of perceived exertion (RPE) scale  Yes       Intervention  Provide education and explanation on how to use RPE scale       Expected Outcomes  Short Term: Able to use RPE daily in rehab to express subjective intensity level;Long Term:  Able to use RPE to guide intensity level when exercising independently       Able to understand and use Dyspnea scale  Yes       Intervention  Provide education and explanation on how to use Dyspnea scale       Expected Outcomes  Short Term: Able to use Dyspnea scale daily in rehab to express subjective sense of shortness of breath during exertion;Long Term: Able to use Dyspnea scale to guide intensity level when exercising independently       Knowledge and understanding of Target Heart Rate Range (THRR)  Yes       Intervention  Provide education and explanation of THRR including how the numbers were predicted and where they are located for reference       Expected Outcomes  Short Term: Able to state/look up THRR;Short Term: Able to use daily as guideline for intensity in rehab;Long Term: Able to use THRR to govern intensity when exercising independently       Able to check pulse independently  Yes       Intervention  Provide education and demonstration on  how to check pulse in carotid and radial arteries.;Review the importance of being able to check your own pulse for safety during independent exercise       Expected Outcomes  Short Term: Able to explain why pulse checking is important  during independent exercise;Long Term: Able to check pulse independently and accurately       Understanding of Exercise Prescription  Yes       Intervention  Provide education, explanation, and written materials on patient's individual exercise prescription       Expected Outcomes  Short Term: Able to explain program exercise prescription;Long Term: Able to explain home exercise prescription to exercise independently          Exercise Goals Re-Evaluation :   Discharge Exercise Prescription (Final Exercise Prescription Changes):   Nutrition:  Target Goals: Understanding of nutrition guidelines, daily intake of sodium <1520m, cholesterol <2087m calories 30% from fat and 7% or less from saturated fats, daily to have 5 or more servings of fruits and vegetables.  Biometrics: Pre Biometrics - 03/06/18 1218      Pre Biometrics   Height  5' (1.524 m)    Weight  142 lb 14.4 oz (64.8 kg)    Waist Circumference  35 inches    Hip Circumference  41.5 inches    Waist to Hip Ratio  0.84 %    BMI (Calculated)  27.91        Nutrition Therapy Plan and Nutrition Goals: Nutrition Therapy & Goals - 03/06/18 1139      Personal Nutrition Goals   Nutrition Goal  Maintain weight    Personal Goal #2  Make sure she is getting enough nutrition    Comments  Her appitite is not what it used to be.      Intervention Plan   Intervention  Prescribe, educate and counsel regarding individualized specific dietary modifications aiming towards targeted core components such as weight, hypertension, lipid management, diabetes, heart failure and other comorbidities.    Expected Outcomes  Short Term Goal: Understand basic principles of dietary content, such as calories, fat, sodium, cholesterol and nutrients.;Long Term Goal: Adherence to prescribed nutrition plan.       Nutrition Assessments: Nutrition Assessments - 03/06/18 1138      MEDFICTS Scores   Pre Score  36       Nutrition Goals  Re-Evaluation:   Nutrition Goals Discharge (Final Nutrition Goals Re-Evaluation):   Psychosocial: Target Goals: Acknowledge presence or absence of significant depression and/or stress, maximize coping skills, provide positive support system. Participant is able to verbalize types and ability to use techniques and skills needed for reducing stress and depression.   Initial Review & Psychosocial Screening: Initial Psych Review & Screening - 03/06/18 1135      Initial Review   Current issues with  Current Sleep Concerns;Current Stress Concerns    Source of Stress Concerns  Chronic Illness    Comments  She gets short of breath walking.      Family Dynamics   Good Support System?  Yes    Comments  She can look to her daugher Tammy for support      Barriers   Psychosocial barriers to participate in program  The patient should benefit from training in stress management and relaxation.      Screening Interventions   Interventions  Encouraged to exercise;Provide feedback about the scores to participant;Program counselor consult;To provide support and resources with identified psychosocial needs    Expected Outcomes  Short Term  goal: Utilizing psychosocial counselor, staff and physician to assist with identification of specific Stressors or current issues interfering with healing process. Setting desired goal for each stressor or current issue identified.;Long Term Goal: Stressors or current issues are controlled or eliminated.;Short Term goal: Identification and review with participant of any Quality of Life or Depression concerns found by scoring the questionnaire.;Long Term goal: The participant improves quality of Life and PHQ9 Scores as seen by post scores and/or verbalization of changes       Quality of Life Scores:  Scores of 19 and below usually indicate a poorer quality of life in these areas.  A difference of  2-3 points is a clinically meaningful difference.  A difference of 2-3  points in the total score of the Quality of Life Index has been associated with significant improvement in overall quality of life, self-image, physical symptoms, and general health in studies assessing change in quality of life.  PHQ-9: Recent Review Flowsheet Data    Depression screen [facility] 2/9 03/06/2018 09/28/2017 07/26/2016 01/29/2015 08/09/2014   Decreased Interest 1 1 0 0 0   Down, Depressed, Hopeless 1 3 0 0 0   PHQ - 2 Score 2 4 0 0 0   Altered sleeping 2 2 - - -   Tired, decreased energy 2 3 - - -   Change in appetite 2 2 - - -   Feeling bad or failure about yourself  0 0 - - -   Trouble concentrating 0 0 - - -   Moving slowly or fidgety/restless 0 0 - - -   Suicidal thoughts 0 0 - - -   PHQ-9 Score 8 11 - - -   Difficult doing work/chores Not difficult at all Somewhat difficult - - -     Interpretation of Total Score  Total Score Depression Severity:  1-4 = Minimal depression, 5-9 = Mild depression, 10-14 = Moderate depression, 15-19 = Moderately severe depression, 20-27 = Severe depression   Psychosocial Evaluation and Intervention:   Psychosocial Re-Evaluation:   Psychosocial Discharge (Final Psychosocial Re-Evaluation):   Education: Education Goals: Education classes will be provided on a weekly basis, covering required topics. Participant will state understanding/return demonstration of topics presented.  Learning Barriers/Preferences: Learning Barriers/Preferences - 03/06/18 1145      Learning Barriers/Preferences   Learning Barriers  Sight wears glasses    Learning Preferences  None       Education Topics:  Initial Evaluation Education: - Verbal, written and demonstration of respiratory meds, oximetry and breathing techniques. Instruction on use of nebulizers and MDIs and importance of monitoring MDI activations.   Pulmonary Rehab from 03/06/2018 in [facility] Cardiac and Pulmonary Rehab  Date  03/06/18  Educator  [facility]  Instruction Review Code  1- Verbalizes  Understanding      General Nutrition Guidelines/Fats and Fiber: -Group instruction provided by verbal, written material, models and posters to present the general guidelines for heart healthy nutrition. Gives an explanation and review of dietary fats and fiber.   Controlling Sodium/Reading Food Labels: -Group verbal and written material supporting the discussion of sodium use in heart healthy nutrition. Review and explanation with models, verbal and written materials for utilization of the food label.   Exercise Physiology & General Exercise Guidelines: - Group verbal and written instruction with models to review the exercise physiology of the cardiovascular system and associated critical values. Provides general exercise guidelines with specific guidelines to those with heart or lung disease.    Aerobic Exercise &  Resistance Training: - Gives group verbal and written instruction on the various components of exercise. Focuses on aerobic and resistive training programs and the benefits of this training and how to safely progress through these programs.   Flexibility, Balance, Mind/Body Relaxation: Provides group verbal/written instruction on the benefits of flexibility and balance training, including mind/body exercise modes such as yoga, pilates and tai chi.  Demonstration and skill practice provided.   Stress and Anxiety: - Provides group verbal and written instruction about the health risks of elevated stress and causes of high stress.  Discuss the correlation between heart/lung disease and anxiety and treatment options. Review healthy ways to manage with stress and anxiety.   Depression: - Provides group verbal and written instruction on the correlation between heart/lung disease and depressed mood, treatment options, and the stigmas associated with seeking treatment.   Exercise & Equipment Safety: - Individual verbal instruction and demonstration of equipment use and safety with  use of the equipment.   Pulmonary Rehab from 03/06/2018 in [facility] Cardiac and Pulmonary Rehab  Date  03/06/18  Educator  [facility]  Instruction Review Code  1- Verbalizes Understanding      Infection Prevention: - Provides verbal and written material to individual with discussion of infection control including proper hand washing and proper equipment cleaning during exercise session.   Pulmonary Rehab from 03/06/2018 in [facility] Cardiac and Pulmonary Rehab  Date  03/06/18  Educator  [facility]  Instruction Review Code  1- Verbalizes Understanding      Falls Prevention: - Provides verbal and written material to individual with discussion of falls prevention and safety.   Pulmonary Rehab from 03/06/2018 in [facility] Cardiac and Pulmonary Rehab  Date  03/06/18  Educator  [facility]  Instruction Review Code  1- Verbalizes Understanding      Diabetes: - Individual verbal and written instruction to review signs/symptoms of diabetes, desired ranges of glucose level fasting, after meals and with exercise. Advice that pre and post exercise glucose checks will be done for 3 sessions at entry of program.   Chronic Lung Diseases: - Group verbal and written instruction to review updates, respiratory medications, advancements in procedures and treatments. Discuss use of supplemental oxygen including available portable oxygen systems, continuous and intermittent flow rates, concentrators, personal use and safety guidelines. Review proper use of inhaler and spacers. Provide informative websites for self-education.    Energy Conservation: - Provide group verbal and written instruction for methods to conserve energy, plan and organize activities. Instruct on pacing techniques, use of adaptive equipment and posture/positioning to relieve shortness of breath.   Triggers and Exacerbations: - Group verbal and written instruction to review types of environmental triggers and ways to prevent exacerbations. Discuss weather changes, air  quality and the benefits of nasal washing. Review warning signs and symptoms to help prevent infections. Discuss techniques for effective airway clearance, coughing, and vibrations.   AED/CPR: - Group verbal and written instruction with the use of models to demonstrate the basic use of the AED with the basic ABC's of resuscitation.   Anatomy and Physiology of the Lungs: - Group verbal and written instruction with the use of models to provide basic lung anatomy and physiology related to function, structure and complications of lung disease.   Anatomy & Physiology of the Heart: - Group verbal and written instruction and models provide basic cardiac anatomy and physiology, with the coronary electrical and arterial systems. Review of Valvular disease and Heart Failure   Cardiac Medications: - Group verbal and written  instruction to review commonly prescribed medications for heart disease. Reviews the medication, class of the drug, and side effects.   Know Your Numbers and Risk Factors: -Group verbal and written instruction about important numbers in your health.  Discussion of what are risk factors and how they play a role in the disease process.  Review of Cholesterol, Blood Pressure, Diabetes, and BMI and the role they play in your overall health.   Sleep Hygiene: -Provides group verbal and written instruction about how sleep can affect your health.  Define sleep hygiene, discuss sleep cycles and impact of sleep habits. Review good sleep hygiene tips.    Other: -Provides group and verbal instruction on various topics (see comments)    Knowledge Questionnaire Score: Knowledge Questionnaire Score - 03/06/18 1140      Knowledge Questionnaire Score   Pre Score  11/18 reviewed with patient        Core Components/Risk Factors/Patient Goals at Admission: Personal Goals and Risk Factors at Admission - 03/06/18 1146      Core Components/Risk Factors/Patient Goals on Admission     Weight Management  Yes;Weight Maintenance;Weight Loss    Intervention  Weight Management: Develop a combined nutrition and exercise program designed to reach desired caloric intake, while maintaining appropriate intake of nutrient and fiber, sodium and fats, and appropriate energy expenditure required for the weight goal.;Weight Management: Provide education and appropriate resources to help participant work on and attain dietary goals.;Weight Management/Obesity: Establish reasonable short term and long term weight goals.    Admit Weight  142 lb 14.4 oz (64.8 kg)    Goal Weight: Short Term  135 lb (61.2 kg)    Goal Weight: Long Term  135 lb (61.2 kg)    Expected Outcomes  Short Term: Continue to assess and modify interventions until short term weight is achieved;Long Term: Adherence to nutrition and physical activity/exercise program aimed toward attainment of established weight goal;Weight Maintenance: Understanding of the daily nutrition guidelines, which includes 25-35% calories from fat, 7% or less cal from saturated fats, less than 292m cholesterol, less than 1.5gm of sodium, & 5 or more servings of fruits and vegetables daily;Understanding recommendations for meals to include 15-35% energy as protein, 25-35% energy from fat, 35-60% energy from carbohydrates, less than 2052mof dietary cholesterol, 20-35 gm of total fiber daily;Understanding of distribution of calorie intake throughout the day with the consumption of 4-5 meals/snacks;Weight Loss: Understanding of general recommendations for a balanced deficit meal plan, which promotes 1-2 lb weight loss per week and includes a negative energy balance of [phone] kcal/d    Improve shortness of breath with ADL's  Yes    Intervention  Provide education, individualized exercise plan and daily activity instruction to help decrease symptoms of SOB with activities of daily living.    Expected Outcomes  Short Term: Improve cardiorespiratory fitness to achieve  a reduction of symptoms when performing ADLs;Long Term: Be able to perform more ADLs without symptoms or delay the onset of symptoms    Hypertension  Yes    Intervention  Provide education on lifestyle modifcations including regular physical activity/exercise, weight management, moderate sodium restriction and increased consumption of fresh fruit, vegetables, and low fat dairy, alcohol moderation, and smoking cessation.;Monitor prescription use compliance.    Expected Outcomes  Short Term: Continued assessment and intervention until BP is < 140/9053mG in hypertensive participants. < 130/37m62m in hypertensive participants with diabetes, heart failure or chronic kidney disease.;Long Term: Maintenance of blood pressure at goal levels.  Core Components/Risk Factors/Patient Goals Review:    Core Components/Risk Factors/Patient Goals at Discharge (Final Review):    ITP Comments: ITP Comments    Row Name 03/06/18 1110           ITP Comments  Medical Evaluation completed. Chart sent for review and changes to Dr. Emily Filbert Director of [place]. Diagnosis can be found in [facility] encounter 08/14/17          Comments: Initial ITP

## 2018-03-08 ENCOUNTER — Ambulatory Visit (INDEPENDENT_AMBULATORY_CARE_PROVIDER_SITE_OTHER): Payer: PPO

## 2018-03-08 VITALS — BP 118/72 | HR 62 | Temp 98.1°F | Resp 15 | Ht 60.0 in | Wt 139.8 lb

## 2018-03-08 DIAGNOSIS — Z0000 Encounter for general adult medical examination without abnormal findings: Secondary | ICD-10-CM | POA: Diagnosis not present

## 2018-03-08 NOTE — Patient Instructions (Addendum)
  Savannah Wilson , Thank you for taking time to come for your Medicare Wellness Visit. I appreciate your ongoing commitment to your health goals. Please review the following plan we discussed and let me know if I can assist you in the future.   Follow up as needed.    Bring a copy of your [place] and/or Living Will to be scanned into chart.  Have a great day!  These are the goals we discussed: Goals    . Increase physical activity     Physical therapy 2 times a week, 1.5 hours each session for 18 weeks.        This is a list of the screening recommended for you and due dates:  Health Maintenance  Topic Date Due  . Flu Shot  03/08/2018  . Tetanus Vaccine  05/05/2024  . DEXA scan (bone density measurement)  Completed  . Pneumonia vaccines  Completed

## 2018-03-08 NOTE — Progress Notes (Addendum)
Subjective:   Savannah Wilson is a 84 y.o. female who presents for Medicare Annual (Subsequent) preventive examination.  Review of Systems:   Cardiac Risk Factors include: advanced age (>68mn, >>79women);hypertension     Objective:     Vitals: BP 118/72 (BP Location: Left Arm, Patient Position: Sitting, Cuff Size: Normal)   Pulse 62   Temp 98.1 F (36.7 C) (Oral)   Resp 15   Ht 5' (1.524 m)   Wt 139 lb 12.8 oz (63.4 kg)   SpO2 95%   BMI 27.30 kg/m   Body mass index is 27.3 kg/m.  Advanced Directives 03/08/2018 03/06/2018 09/19/2017 09/19/2017 04/15/2017 04/15/2017 06/08/2015  Does Patient Have a Medical Advance Directive? Yes Yes Yes Yes Yes Yes Yes  Type of AParamedicof [place]os[place]Living will [place]Living will [place] BayLiving will [place]Living will [place] HallLiving will Living will  Does patient want to make changes to medical advance directive? No - Patient declined No - Patient declined No - Patient declined No - Patient declined No - Patient declined - No - Patient declined  Copy of Healthcare Power of Attorney in Chart? - - No - copy requested No - copy requested No - copy requested No - copy requested No - copy requested  Would patient like information on creating a medical advance directive? - - - - No - Patient declined No - Patient declined -    Tobacco Social History   Tobacco Use  Smoking Status Never Smoker  Smokeless Tobacco Never Used     Counseling given: Not Answered   Clinical Intake:  Pre-visit preparation completed: Yes  Pain : No/denies pain     Nutritional Status: BMI 25 -29 Overweight Diabetes: No  How often do you need to have someone help you when you read instructions, pamphlets, or other written materials from your doctor or pharmacy?: 3 - Sometimes(Difficulty seeing small print without reading glasses.  ) What is the last grade level you completed in school?: 12th  Interpreter Needed?: No     Past Medical History:  Diagnosis Date  . Asthma   . Barrett esophagus   . Chronic bronchitis   . Chronic kidney disease (CKD), stage IV (severe) ([place]   . Depression   . Diverticulosis   . GERD (gastroesophageal reflux disease)   . Gout   . Hyperlipidemia   . Hypertension   . Hypothyroid   . Mitral regurgitation    a. TTE 02/2017: EF 55-60%, normal WM, calcified mitral annulus with mod regurg, moderately dilated LA, unable to estimate PASP  . Pancreatitis due to biliary obstruction March 2010   s/p ERCP sphincterotomy, cholecystectomy  . Peripheral neuralgia   . Persistent atrial fibrillation ([place]    a. diagnosed 01/24/17; CHADS2VASc => 5 (HTN, age x 2, vascular disease with PAD and aortic plaque, female) giving her an estimated annual stroke risk of 6.7%; b. successful DCCV 03/28/2017; c. on Eliquis  . Venous insufficiency    Past Surgical History:  Procedure Laterality Date  . ABDOMINAL HYSTERECTOMY    . CARDIOVERSION N/A 03/28/2017   Procedure: CARDIOVERSION;  Surgeon: ENelva Bush MD;  [facility];  Service: Cardiovascular;  Laterality: N/A;  . CARPAL TUNNEL RELEASE  jan 2013   CMargaretmary Eddy . CATARACT EXTRACTION     right  . CATARACT EXTRACTION  2008  . CHOLECYSTECTOMY  02/10  . CHOLECYSTECTOMY  2010  .  TONSILLECTOMY    . VENTRAL HERNIA REPAIR    . VENTRAL HERNIA REPAIR  2007  . VESICOVAGINAL FISTULA CLOSURE W/ TAH     Family History  Problem Relation Age of Onset  . Kidney failure Mother   . Hypertension Mother   . Kidney disease Mother   . Multiple myeloma Daughter   . Cancer Daughter        multiple myeloma  . Kidney disease Daughter   . Heart disease Father   . Rheumatologic disease Father   . Kidney cancer Father    Social History   Socioeconomic History  . Marital status: Widowed    Spouse name: Not on file  . Number of children: 2  . Years of  education: Not on file  . Highest education level: Not on file  Occupational History  . 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 Financial resource strain: Not hard at all  . Food insecurity:    Worry: Never true    Inability: Never true  . Transportation needs:    Medical: No    Non-medical: No  Tobacco Use  . Smoking status: Never Smoker  . Smokeless tobacco: Never Used  Substance and Sexual Activity  . Alcohol use: No    Comment: rare  . Drug use: No  . Sexual activity: Never  Lifestyle  . Physical activity:    Days per week: Not on file    Minutes per session: Not on file  . Stress: Not on file  Relationships  . Social connections:    Talks on phone: Not on file    Gets together: Not on file    Attends religious service: Not on file    Active member of club or organization: Not on file    Attends meetings of clubs or organizations: Not on file    Relationship status: Not on file  Other Topics Concern  . Not on file  Social History Narrative   ** Merged History Encounter **       Widowed. Has 2 children and lives alone.   Lives by herself. Has a cane, but ambulates independently    Outpatient Encounter Medications as of 03/08/2018  Medication Sig  . acetaminophen (TYLENOL) 650 MG CR tablet Take 650 mg by mouth every 8 (eight) hours as needed for pain.   Marland Kitchen ALPRAZolam (XANAX) 0.5 MG tablet Take 1 tablet (0.5 mg total) by mouth 2 (two) times daily as needed for anxiety.  Marland Kitchen apixaban (ELIQUIS) 2.5 MG TABS tablet Take 1 tablet (2.5 mg total) by mouth 2 (two) times daily.  . benzonatate (TESSALON) 200 MG capsule Take 1 capsule (200 mg total) by mouth 3 (three) times daily as needed for cough.  . budesonide (PULMICORT) 0.25 MG/2ML nebulizer solution Take 2 mLs (0.25 mg total) by nebulization 2 (two) times daily.  . Cholecalciferol (VITAMIN D-3) 1000 units CAPS Take 1,000 Units by mouth daily.   Marland Kitchen escitalopram (LEXAPRO) 10 MG tablet Take 1 tablet (10 mg  total) by mouth daily. After dinner  . fluticasone (FLONASE) 50 MCG/ACT nasal spray Place 2 sprays into the nose daily. (Patient taking differently: Place 2 sprays into the nose daily as needed (For allergies.). )  . furosemide (LASIX) 40 MG tablet Take 40 mg by mouth daily.  . hydrALAZINE (APRESOLINE) 50 MG tablet Take 1 tablet (50 mg total) by mouth 2 (two) times daily. for hypertension  . ipratropium-albuterol (DUONEB) 0.5-2.5 (3) MG/3ML SOLN Take 3  mLs by nebulization every 6 (six) hours as needed.  Marland Kitchen levothyroxine (SYNTHROID, LEVOTHROID) 100 MCG tablet Take 1 tablet (100 mcg total) by mouth daily. For thyroid  . omeprazole (PRILOSEC) 40 MG capsule TAKE ONE CAPSULE BY MOUTH TWICE A DAY. BEFORE BREAKFAST AND SUPPER  . PROAIR HFA 108 (90 BASE) MCG/ACT inhaler INHALE 2 PUFFS EVERY 6 HOURS AS NEEDED FOR WHEEZING  . Spacer/Aero-Holding Chambers (OPTICHAMBER ADVANTAGE) MISC 1 each by Other route once. Always uses her when you're using a metered-dose inhaler. You've aromatase medicine as much, he won't have his much side effect, but you it twice as much medicine and your lungs.  . temazepam (RESTORIL) 15 MG capsule TAKE 1 CAPSULE BY MOUTH AT BEDTIME AS NEEDED FOR SLEEP  . vitamin B-12 (CYANOCOBALAMIN) 1000 MCG tablet Take 1,000 mcg by mouth daily.  Marland Kitchen losartan (COZAAR) 50 MG tablet Take 1 tablet (50 mg total) by mouth daily.   No facility-administered encounter medications on file as of 03/08/2018.     Activities of Daily Living In your present state of health, do you have any difficulty performing the following activities: 03/08/2018 09/19/2017  Hearing? N N  Vision? N N  Difficulty concentrating or making decisions? N N  Walking or climbing stairs? Y N  Comment Unsteady gait. Paces herself when cane or walker are not in use.  -  Dressing or bathing? N N  Doing errands, shopping? N N  Preparing Food and eating ? N -  Using the Toilet? N -  In the past six months, have you accidently leaked urine?  Y -  Comment Followed by specialist every 3 months. Managed with a daily liner or brief.  -  Do you have problems with loss of bowel control? N -  Managing your Medications? N -  Managing your Finances? N -  Comment She writes her own checks.   -  Housekeeping or managing your Housekeeping? Y -  Some recent data might be hidden    Patient Care Team: [name], MD as PCP - General (Internal Medicine) End, Harrell Gave, MD as Consulting Physician (Cardiology)    Assessment:   This is a routine wellness examination for Savannah Wilson.  The goal of the wellness visit is to assist the patient how to close the gaps in care and create a preventative care plan for the patient.   The roster of all physicians providing medical care to patient is listed in the Snapshot section of the chart.  Taking calcium VIT D3 as appropriate/Osteoporosis risk reviewed.    Safety issues reviewed; Lives alone.  Life alert, smoke and carbon monoxide detectors in the home. No firearms in the home. Wears seatbelts when driving or riding with others. No violence in the home.  They do not have excessive sun exposure.  Discussed the need for sun protection: hats, long sleeves and the use of sunscreen if there is significant sun exposure.  Patient is alert, normal appearance, oriented to person/place/and time. Correctly identified the president of the [place] and recalls of 2/3 words.Performs simple calculations and can read correct time from watch face. Displays appropriate judgement.  No new identified risk were noted.  No failures at ADL's or IADL's.  Ambulates with walker at night when using the restroom and during the day when she feels tired or unsteady.   BMI- discussed the importance of a healthy diet, water intake and the benefits of aerobic exercise.   24 hour diet recall: Regular diet  Dental- UTD. Ceasar Mons.  Sleep patterns- She states her sleep is somewhat broken about half of the nights in a week and  can be awake for 2-3 hours at a time. Taking medication as directed. She does not nap during the day.  Health maintenance gaps- closed.  Patient Concerns: None at this time. Follow up with PCP as needed.  Exercise Activities and Dietary recommendations Current Exercise Habits: Home exercise routine, Type of exercise: walking, Intensity: Mild  Goals    . Increase physical activity     Physical therapy 2 times a week, 1.5 hours each session for 18 weeks.        Fall Risk Fall Risk  03/08/2018 03/06/2018 07/26/2016 01/29/2015 08/09/2014  Falls in the past year? Yes Yes Yes No No  Number falls in past yr: 2 or more 2 or more 1 - -  Injury with Fall? Yes Yes No - -  Comment She hurt her thumb and had difficulty getting up on her own.  Daughter assisted in helping. She did not seek medical attention.  hurt her thumb - - -  Risk for fall due to : History of fall(s);Impaired balance/gait History of fall(s);Impaired balance/gait - - -  Risk for fall due to: Comment She uses her cane and/or walker when she feels tired or unsteady.  - - - -  Follow up Falls prevention discussed;Education provided Education provided;Falls prevention discussed - - -  Depression Screen PHQ 2/9 Scores 03/08/2018 03/06/2018 09/28/2017 07/26/2016  PHQ - 2 Score 1 2 4  0  PHQ- 9 Score 6 8 11  -     Cognitive Function     6CIT Screen 03/08/2018  What Year? 0 points  What month? 0 points  What time? 0 points  Count back from 20 0 points  Months in reverse 0 points  Repeat phrase 0 points  Total Score 0    Immunization History  Administered Date(s) Administered  . Influenza Split 05/21/2012, 06/03/2013, 06/05/2015  . Influenza, High Dose Seasonal PF 05/03/2017  . Influenza,inj,Quad PF,6+ Mos 04/04/2014  . Influenza-Unspecified 05/31/2016  . Pneumococcal Conjugate-13 10/04/2013  . Pneumococcal Polysaccharide-23 10/04/2009, 07/22/2015  . Tdap 05/05/2014  . Zoster 12/19/2013   Screening Tests Health Maintenance    Topic Date Due  . INFLUENZA VACCINE  03/08/2018  . TETANUS/TDAP  05/05/2024  . DEXA SCAN  Completed  . PNA vac Low Risk Adult  Completed      Plan:    End of life planning; Advance aging; Advanced directives discussed. Copy of current HCPOA/Living Will requested.    I have personally reviewed and noted the following in the patient's chart:   . Medical and social history . Use of alcohol, tobacco or illicit drugs  . Current medications and supplements . Functional ability and status . Nutritional status . Physical activity . Advanced directives . List of other physicians . Hospitalizations, surgeries, and ER visits in previous 12 months . Vitals . Screenings to include cognitive, depression, and falls . Referrals and appointments  In addition, I have reviewed and discussed with patient certain preventive protocols, quality metrics, and best practice recommendations. A written personalized care plan for preventive services as well as general preventive health recommendations were provided to patient.     OBrien-Blaney, Sheniya Garciaperez L, LPN  01/07/6836     I have reviewed the above information and agree with above.   Deborra Medina, MD

## 2018-03-13 ENCOUNTER — Other Ambulatory Visit: Payer: Self-pay

## 2018-03-13 MED ORDER — OMEPRAZOLE 40 MG PO CPDR: DELAYED_RELEASE_CAPSULE | ORAL | 1 refills | Status: DC

## 2018-03-13 NOTE — Telephone Encounter (Signed)
Refilled: 09/28/2017  #90 no refills Last OV: 01/29/2018 Next OV: 05/01/2018 Last TSH: 01/26/2017

## 2018-03-14 ENCOUNTER — Encounter: Payer: PPO | Attending: Internal Medicine | Admitting: *Deleted

## 2018-03-14 DIAGNOSIS — K219 Gastro-esophageal reflux disease without esophagitis: Secondary | ICD-10-CM | POA: Insufficient documentation

## 2018-03-14 DIAGNOSIS — Z79899 Other long term (current) drug therapy: Secondary | ICD-10-CM | POA: Diagnosis not present

## 2018-03-14 DIAGNOSIS — I129 Hypertensive chronic kidney disease with stage 1 through stage 4 chronic kidney disease, or unspecified chronic kidney disease: Secondary | ICD-10-CM | POA: Diagnosis not present

## 2018-03-14 DIAGNOSIS — N184 Chronic kidney disease, stage 4 (severe): Secondary | ICD-10-CM | POA: Diagnosis not present

## 2018-03-14 DIAGNOSIS — E785 Hyperlipidemia, unspecified: Secondary | ICD-10-CM | POA: Insufficient documentation

## 2018-03-14 DIAGNOSIS — Z79890 Hormone replacement therapy (postmenopausal): Secondary | ICD-10-CM | POA: Diagnosis not present

## 2018-03-14 DIAGNOSIS — R0609 Other forms of dyspnea: Secondary | ICD-10-CM

## 2018-03-14 MED ORDER — LEVOTHYROXINE SODIUM 100 MCG PO TABS: 100.0000 ug | ORAL_TABLET | Freq: Every day | ORAL | 0 refills | Status: DC

## 2018-03-14 NOTE — Progress Notes (Signed)
Daily Session Note  Patient Details  Name: Savannah Wilson MRN: [id number] Date of Birth: 1929-01-06 Referring Provider:     Pulmonary Rehab from 03/06/2018 in [facility] Cardiac and Pulmonary Rehab  Referring Provider  Derrel Nip      Encounter Date: 03/14/2018  Check In: Session Check In - 03/14/18 1122      Check-In   Supervising physician immediately available to respond to emergencies  [facility] immediately available ER MD    Physician(s)  Dr. Clearnce Hasten and Rifenbark    Location  [facility]-Cardiac & Pulmonary Rehab    Staff Present  Justin Mend RCP,RRT,BSRT;Jessica Luan Pulling, [place], RCEP, CCRP, Exercise Physiologist;Zamora Colton Sherryll Burger, RN BSN    Medication changes reported      No    Fall or balance concerns reported     No    Warm-up and Cool-down  Performed as group-led Higher education careers adviser Performed  Yes    VAD Patient?  No    PAD/SET Patient?  No      Pain Assessment   Currently in Pain?  No/denies          Social History   Tobacco Use  Smoking Status Never Smoker  Smokeless Tobacco Never Used    Goals Met:  Proper associated with RPD/PD & O2 Sat Independence with exercise equipment Using PLB without cueing & demonstrates good technique Exercise tolerated well No report of cardiac concerns or symptoms Strength training completed today  Goals Unmet:  Not Applicable  Comments: First full day of exercise!  Patient was oriented to gym and equipment including functions, settings, policies, and procedures.  Patient's individual exercise prescription and treatment plan were reviewed.  All starting workloads were established based on the results of the 6 minute walk test done at initial orientation visit.  The plan for exercise progression was also introduced and progression will be customized based on patient's performance and goals.   Dr. Emily Filbert is Medical Director for [place] and [facility].

## 2018-03-19 DIAGNOSIS — R0609 Other forms of dyspnea: Secondary | ICD-10-CM | POA: Diagnosis not present

## 2018-03-19 NOTE — Progress Notes (Signed)
Daily Session Note  Patient Details  Name: Savannah Wilson MRN: [id number] Date of Birth: 1929/07/04 Referring Provider:     Pulmonary Rehab from 03/06/2018 in [facility] Cardiac and Pulmonary Rehab  Referring Provider  Derrel Nip      Encounter Date: 03/19/2018  Check In: Session Check In - 03/19/18 1153      Check-In   Supervising physician immediately available to respond to emergencies  [facility] immediately available ER MD    Physician(s)  Dr. Quentin Cornwall and Dr. Corky Downs    Location  [facility]-Cardiac & Pulmonary Rehab    Staff Present  Joellyn Rued, BS, PEC;Kelly [place], BS, ACSM CEP, Exercise Physiologist;Diya Gervasi Oletta Darter, [place], ACSM CEP, Exercise Physiologist    Medication changes reported      No    Fall or balance concerns reported     No    Tobacco Cessation  No Change    Warm-up and Cool-down  Performed as group-led instruction    Resistance Training Performed  Yes    VAD Patient?  No    PAD/SET Patient?  No      Pain Assessment   Currently in Pain?  No/denies          Social History   Tobacco Use  Smoking Status Never Smoker  Smokeless Tobacco Never Used    Goals Met:  Proper associated with RPD/PD & O2 Sat Independence with exercise equipment Exercise tolerated well Strength training completed today  Goals Unmet:  Not Applicable  Comments: Pt able to follow exercise prescription today without complaint.  Will continue to monitor for progression.    Dr. Emily Filbert is Medical Director for [place] and [facility].

## 2018-03-21 ENCOUNTER — Encounter: Payer: PPO | Admitting: *Deleted

## 2018-03-21 DIAGNOSIS — R0609 Other forms of dyspnea: Secondary | ICD-10-CM | POA: Diagnosis not present

## 2018-03-21 NOTE — Progress Notes (Signed)
Daily Session Note  Patient Details  Name: CAM HARNDEN MRN: [id number] Date of Birth: 07/04/1929 Referring Provider:     Pulmonary Rehab from 03/06/2018 in [facility] Cardiac and Pulmonary Rehab  Referring Provider  Derrel Nip      Encounter Date: 03/21/2018  Check In: Session Check In - 03/21/18 1158      Check-In   Supervising physician immediately available to respond to emergencies  [facility] immediately available ER MD    Physician(s)  Dr. Cinda Quest and Jimmye Norman    Location  [facility]-Cardiac & Pulmonary Rehab    Staff Present  Darel Hong, RN BSN;Meredith Sherryll Burger, RN BSN;Jessica Hawkins, MA, RCEP, CCRP, Exercise Physiologist    Medication changes reported      No    Fall or balance concerns reported     No    Tobacco Cessation  No Change    Warm-up and Cool-down  Performed as group-led instruction    Resistance Training Performed  Yes    VAD Patient?  No      Pain Assessment   Currently in Pain?  No/denies          Social History   Tobacco Use  Smoking Status Never Smoker  Smokeless Tobacco Never Used    Goals Met:  Proper associated with RPD/PD & O2 Sat Independence with exercise equipment Using PLB without cueing & demonstrates good technique Exercise tolerated well No report of cardiac concerns or symptoms Strength training completed today  Goals Unmet:  Not Applicable  Comments: Pt able to follow exercise prescription today without complaint.  Will continue to monitor for progression.    Dr. Emily Filbert is Medical Director for [place] and [facility].

## 2018-03-26 DIAGNOSIS — R0609 Other forms of dyspnea: Principal | ICD-10-CM

## 2018-03-26 NOTE — Progress Notes (Signed)
Daily Session Note  Patient Details  Name: Savannah Wilson MRN: [id number] Date of Birth: [birth year] Referring Provider:     Pulmonary Rehab from 03/06/2018 in [facility] Cardiac and Pulmonary Rehab  Referring Provider  Derrel Nip      Encounter Date: 03/26/2018  Check In:      Social History   Tobacco Use  Smoking Status Never Smoker  Smokeless Tobacco Never Used    Goals Met:  Proper associated with RPD/PD & O2 Sat Independence with exercise equipment Exercise tolerated well Strength training completed today  Goals Unmet:  Not Applicable  Comments: Pt able to follow exercise prescription today without complaint.  Will continue to monitor for progression.    Dr. Emily Filbert is Medical Director for [place] and [facility].

## 2018-03-28 ENCOUNTER — Encounter: Payer: PPO | Admitting: *Deleted

## 2018-03-28 DIAGNOSIS — R0609 Other forms of dyspnea: Secondary | ICD-10-CM | POA: Diagnosis not present

## 2018-03-28 NOTE — Progress Notes (Signed)
Daily Session Note  Patient Details  Name: VENNESA BASTEDO MRN: [id number] Date of Birth: 11-01-1928 Referring Provider:     Pulmonary Rehab from 03/06/2018 in [facility] Cardiac and Pulmonary Rehab  Referring Provider  Derrel Nip      Encounter Date: 03/28/2018  Check In: Session Check In - 03/28/18 1115      Check-In   Supervising physician immediately available to respond to emergencies  [facility] immediately available ER MD    Physician(s)  Dr. Jimmye Norman and Mariea Clonts    Location  [facility]-Cardiac & Pulmonary Rehab    Staff Present  Renita Papa, RN BSN;Jessica Luan Pulling, MA, RCEP, CCRP, Exercise Physiologist;Joseph Tessie Fass RCP,RRT,BSRT    Medication changes reported      No    Fall or balance concerns reported     No    Tobacco Cessation  No Change    Warm-up and Cool-down  Performed as group-led instruction    Resistance Training Performed  Yes    VAD Patient?  No      Pain Assessment   Currently in Pain?  No/denies          Social History   Tobacco Use  Smoking Status Never Smoker  Smokeless Tobacco Never Used    Goals Met:  Proper associated with RPD/PD & O2 Sat Independence with exercise equipment Using PLB without cueing & demonstrates good technique Exercise tolerated well No report of cardiac concerns or symptoms Strength training completed today  Goals Unmet:  Not Applicable  Comments: Pt able to follow exercise prescription today without complaint.  Will continue to monitor for progression. Reviewed home exercise with pt today.  Pt plans to walk at home and stores for exercise.  She is going to start by adding in one extra day at home to get to three days a week.  Reviewed THR, pulse, RPE, sign and symptoms, and when to call 911 or MD.  Also discussed weather considerations and indoor options.  Pt voiced understanding.    Dr. Emily Filbert is Medical Director for [place] and [facility].

## 2018-04-02 DIAGNOSIS — R0609 Other forms of dyspnea: Principal | ICD-10-CM

## 2018-04-02 NOTE — Progress Notes (Signed)
Pulmonary Individual Treatment Plan  Patient Details  Name: Savannah Wilson MRN: [id number] Date of Birth: [birth year] Referring Provider:     Pulmonary Rehab from 03/06/2018 in [facility] Cardiac and Pulmonary Rehab  Referring Provider  [name]      Initial Encounter Date:    Pulmonary Rehab from 03/06/2018 in [facility] Cardiac and Pulmonary Rehab  Date  03/06/18      Visit Diagnosis: Dyspnea on exertion  Patient's Home Medications on Admission:  Current Outpatient Medications:  .  acetaminophen (TYLENOL) 650 MG CR tablet, Take 650 mg by mouth every 8 (eight) hours as needed for pain. , Disp: , Rfl:  .  ALPRAZolam (XANAX) 0.5 MG tablet, Take 1 tablet (0.5 mg total) by mouth 2 (two) times daily as needed for anxiety., Disp: 45 tablet, Rfl: 5 .  apixaban (ELIQUIS) 2.5 MG TABS tablet, Take 1 tablet (2.5 mg total) by mouth 2 (two) times daily., Disp: 180 tablet, Rfl: 3 .  benzonatate (TESSALON) 200 MG capsule, Take 1 capsule (200 mg total) by mouth 3 (three) times daily as needed for cough., Disp: 60 capsule, Rfl: 1 .  budesonide (PULMICORT) 0.25 MG/2ML nebulizer solution, Take 2 mLs (0.25 mg total) by nebulization 2 (two) times daily., Disp: 60 mL, Rfl: 12 .  Cholecalciferol (VITAMIN D-3) 1000 units CAPS, Take 1,000 Units by mouth daily. , Disp: , Rfl:  .  escitalopram (LEXAPRO) 10 MG tablet, Take 1 tablet (10 mg total) by mouth daily. After dinner, Disp: 90 tablet, Rfl: 1 .  fluticasone (FLONASE) 50 MCG/ACT nasal spray, Place 2 sprays into the nose daily. (Patient taking differently: Place 2 sprays into the nose daily as needed (For allergies.). ), Disp: 16 g, Rfl: 6 .  furosemide (LASIX) 40 MG tablet, Take 40 mg by mouth daily., Disp: , Rfl:  .  hydrALAZINE (APRESOLINE) 50 MG tablet, Take 1 tablet (50 mg total) by mouth 2 (two) times daily. for hypertension, Disp: 180 tablet, Rfl: 3 .  ipratropium-albuterol (DUONEB) 0.5-2.5 (3) MG/3ML SOLN, Take 3 mLs by nebulization every 6 (six) hours as needed.,  Disp: 360 mL, Rfl: 0 .  levothyroxine (SYNTHROID, LEVOTHROID) 100 MCG tablet, Take 1 tablet (100 mcg total) by mouth daily. For thyroid, Disp: 90 tablet, Rfl: 0 .  losartan (COZAAR) 50 MG tablet, Take 1 tablet (50 mg total) by mouth daily., Disp: 90 tablet, Rfl: 0 .  omeprazole (PRILOSEC) 40 MG capsule, TAKE ONE CAPSULE BY MOUTH TWICE A DAY. BEFORE BREAKFAST AND SUPPER, Disp: 180 capsule, Rfl: 1 .  PROAIR HFA 108 (90 BASE) MCG/ACT inhaler, INHALE 2 PUFFS EVERY 6 HOURS AS NEEDED FOR WHEEZING, Disp: 8.5 g, Rfl: 3 .  Spacer/Aero-Holding Chambers (OPTICHAMBER ADVANTAGE) MISC, 1 each by Other route once. Always uses her when you're using a metered-dose inhaler. You've aromatase medicine as much, he won't have his much side effect, but you it twice as much medicine and your lungs., Disp: 1 each, Rfl: 0 .  temazepam (RESTORIL) 15 MG capsule, TAKE 1 CAPSULE BY MOUTH AT BEDTIME AS NEEDED FOR SLEEP, Disp: 30 capsule, Rfl: 2 .  vitamin B-12 (CYANOCOBALAMIN) 1000 MCG tablet, Take 1,000 mcg by mouth daily., Disp: , Rfl:   Past Medical History: Past Medical History:  Diagnosis Date  . Asthma   . Barrett esophagus   . Chronic bronchitis   . Chronic kidney disease (CKD), stage IV (severe) ([place])   . Depression   . Diverticulosis   . GERD (gastroesophageal reflux disease)   . Gout   .  Hyperlipidemia   . Hypertension   . Hypothyroid   . Mitral regurgitation    a. TTE 02/2017: EF 55-60%, normal WM, calcified mitral annulus with mod regurg, moderately dilated LA, unable to estimate PASP  . Pancreatitis due to biliary obstruction March 2010   s/p ERCP sphincterotomy, cholecystectomy  . Peripheral neuralgia   . Persistent atrial fibrillation ([place])    a. diagnosed 01/24/17; CHADS2VASc => 5 (HTN, age x 2, vascular disease with PAD and aortic plaque, female) giving her an estimated annual stroke risk of 6.7%; b. successful DCCV 03/28/2017; c. on Eliquis  . Venous insufficiency     Tobacco Use: Social History    Tobacco Use  Smoking Status Never Smoker  Smokeless Tobacco Never Used    Labs: Recent Review Flowsheet Data    Labs for ITP Cardiac and Pulmonary Rehab Latest Ref Rng & Units 10/28/2013 06/16/2016   Cholestrol 0 - 200 mg/dL 116 124   LDLCALC 0 - 99 mg/dL 58 64   HDL >39.00 mg/dL 26.30(L) 42.50   Trlycerides 0.0 - 149.0 mg/dL 159.0(H) 87.0   Hemoglobin A1c 4.6 - 6.5 % - 5.0       Pulmonary Assessment Scores: Pulmonary Assessment Scores    Row Name 03/06/18 1112         ADL UCSD   ADL Phase  Entry     SOB Score total  55     Rest  0     Walk  2     Stairs  4     Bath  1     Dress  1     Shop  4       CAT Score   CAT Score  17       mMRC Score   mMRC Score  2        Pulmonary Function Assessment: Pulmonary Function Assessment - 03/06/18 1140      Breath   Bilateral Breath Sounds  Clear    Shortness of Breath  Yes;Limiting activity       Exercise Target Goals: Exercise Program Goal: Individual exercise prescription set using results from initial 6 min walk test and THRR while considering  patient's activity barriers and safety.   Exercise Prescription Goal: Initial exercise prescription builds to 30-45 minutes a day of aerobic activity, 2-3 days per week.  Home exercise guidelines will be given to patient during program as part of exercise prescription that the participant will acknowledge.  Activity Barriers & Risk Stratification:   6 Minute Walk: 6 Minute Walk    Row Name 03/06/18 1219         6 Minute Walk   Distance  610 feet     Walk Time  5.6 minutes     # of Rest Breaks  1     MPH  1.23     METS  0.6     RPE  13     Perceived Dyspnea   3     VO2 Peak  2.1     Symptoms  No     Resting HR  67 bpm     Resting BP  138/68     Resting Oxygen Saturation   97 %     Exercise Oxygen Saturation  during 6 min walk  95 %     Max Ex. HR  92 bpm     Max Ex. BP  138/60     2 Minute Post BP  130/64  Interval HR   1 Minute HR  86     2  Minute HR  89     3 Minute HR  92     4 Minute HR  84     5 Minute HR  88     6 Minute HR  88     2 Minute Post HR  64     Interval Heart Rate?  Yes       Interval Oxygen   Interval Oxygen?  Yes     Baseline Oxygen Saturation %  97 %     1 Minute Oxygen Saturation %  95 %     1 Minute Liters of Oxygen  0 L     2 Minute Oxygen Saturation %  95 %     2 Minute Liters of Oxygen  0 L     3 Minute Oxygen Saturation %  96 %     3 Minute Liters of Oxygen  0 L     4 Minute Oxygen Saturation %  96 %     4 Minute Liters of Oxygen  0 L     5 Minute Oxygen Saturation %  95 %     5 Minute Liters of Oxygen  0 L     6 Minute Oxygen Saturation %  96 %     6 Minute Liters of Oxygen  0 L     2 Minute Post Oxygen Saturation %  97 %     2 Minute Post Liters of Oxygen  0 L       Oxygen Initial Assessment: Oxygen Initial Assessment - 03/06/18 1145      Home Oxygen   Home Oxygen Device  None    Sleep Oxygen Prescription  None    Home Exercise Oxygen Prescription  None    Home at Rest Exercise Oxygen Prescription  None      Initial 6 min Walk   Oxygen Used  None      Program Oxygen Prescription   Program Oxygen Prescription  None      Intervention   Short Term Goals  To learn and demonstrate proper use of respiratory medications;To learn and understand importance of maintaining oxygen saturations>88%;To learn and demonstrate proper pursed lip breathing techniques or other breathing techniques.;To learn and understand importance of monitoring SPO2 with pulse oximeter and demonstrate accurate use of the pulse oximeter.    Long  Term Goals  Verbalizes importance of monitoring SPO2 with pulse oximeter and return demonstration;Maintenance of O2 saturations>88%;Compliance with respiratory medication;Demonstrates proper use of MDI's;Exhibits proper breathing techniques, such as pursed lip breathing or other method taught during program session       Oxygen Re-Evaluation: Oxygen Re-Evaluation     Row Name 03/14/18 1126             Program Oxygen Prescription   Program Oxygen Prescription  None         Home Oxygen   Home Oxygen Device  None       Sleep Oxygen Prescription  None       Home Exercise Oxygen Prescription  None       Home at Rest Exercise Oxygen Prescription  None         Goals/Expected Outcomes   Short Term Goals  To learn and demonstrate proper use of respiratory medications;To learn and understand importance of maintaining oxygen saturations>88%;To learn and demonstrate proper pursed lip breathing techniques or other breathing techniques.;To learn and  understand importance of monitoring SPO2 with pulse oximeter and demonstrate accurate use of the pulse oximeter.       Long  Term Goals  Verbalizes importance of monitoring SPO2 with pulse oximeter and return demonstration;Maintenance of O2 saturations>88%;Compliance with respiratory medication;Demonstrates proper use of MDI's;Exhibits proper breathing techniques, such as pursed lip breathing or other method taught during program session       Comments  Reviewed PLB technique with pt.  Talked about how it work and it's important to maintaining his exercise saturations.         Goals/Expected Outcomes  Short: Become more profiecient at using PLB.   Long: Become independent at using PLB.          Oxygen Discharge (Final Oxygen Re-Evaluation): Oxygen Re-Evaluation - 03/14/18 1126      Program Oxygen Prescription   Program Oxygen Prescription  None      Home Oxygen   Home Oxygen Device  None    Sleep Oxygen Prescription  None    Home Exercise Oxygen Prescription  None    Home at Rest Exercise Oxygen Prescription  None      Goals/Expected Outcomes   Short Term Goals  To learn and demonstrate proper use of respiratory medications;To learn and understand importance of maintaining oxygen saturations>88%;To learn and demonstrate proper pursed lip breathing techniques or other breathing techniques.;To learn and  understand importance of monitoring SPO2 with pulse oximeter and demonstrate accurate use of the pulse oximeter.    Long  Term Goals  Verbalizes importance of monitoring SPO2 with pulse oximeter and return demonstration;Maintenance of O2 saturations>88%;Compliance with respiratory medication;Demonstrates proper use of MDI's;Exhibits proper breathing techniques, such as pursed lip breathing or other method taught during program session    Comments  Reviewed PLB technique with pt.  Talked about how it work and it's important to maintaining his exercise saturations.      Goals/Expected Outcomes  Short: Become more profiecient at using PLB.   Long: Become independent at using PLB.       Initial Exercise Prescription: Initial Exercise Prescription - 03/06/18 1200      Date of Initial Exercise RX and Referring Provider   Date  03/06/18    Referring Provider  [name]      Treadmill   MPH  1    Grade  0    Minutes  14    METs  1.7      NuStep   Level  1    SPM  80    Minutes  15    METs  1.5      Biostep-RELP   Level  1    SPM  50    Minutes  15    METs  1.5      Prescription Details   Frequency (times per week)  2    Duration  Progress to 45 minutes of aerobic exercise without signs/symptoms of physical distress      Intensity   THRR 40-80% of Max Heartrate  94-121    Ratings of Perceived Exertion  11-13    Perceived Dyspnea  0-4      Resistance Training   Training Prescription  Yes    Weight  2 lb    Reps  10-15       Perform Capillary Blood Glucose checks as needed.  Exercise Prescription Changes: Exercise Prescription Changes    Row Name 03/20/18 1400 03/28/18 1200           Response  to Exercise   Blood Pressure (Admit)  124/66  -      Blood Pressure (Exercise)  138/68  -      Blood Pressure (Exit)  120/82  -      Heart Rate (Admit)  69 bpm  -      Heart Rate (Exercise)  90 bpm  -      Heart Rate (Exit)  68 bpm  -      Oxygen Saturation (Admit)  97 %  -       Oxygen Saturation (Exercise)  97 %  -      Oxygen Saturation (Exit)  100 %  -      Rating of Perceived Exertion (Exercise)  15  -      Perceived Dyspnea (Exercise)  3  -      Symptoms  SOB and fatigue  -      Comments  second full day of exercise  -      Duration  Progress to 45 minutes of aerobic exercise without signs/symptoms of physical distress  -      Intensity  THRR unchanged  -        Progression   Progression  Continue to progress workloads to maintain intensity without signs/symptoms of physical distress.  -      Average METs  1.84  -        Resistance Training   Training Prescription  Yes  -      Weight  2 lbs  -      Reps  10-15  -        Interval Training   Interval Training  No  -        Treadmill   MPH  1  -      Grade  0  -      Minutes  15  -      METs  1.7  -        NuStep   Level  1  -      Minutes  15  -      METs  1.7  -        Biostep-RELP   Level  1  -      Minutes  15  -      METs  2  -        Home Exercise Plan   Plans to continue exercise at  -  Home (comment) walking      Frequency  -  Add 1 additional day to program exercise sessions.      Initial Home Exercises Provided  -  03/28/18         Exercise Comments: Exercise Comments    Row Name 03/14/18 1124           Exercise Comments  First full day of exercise!  Patient was oriented to gym and equipment including functions, settings, policies, and procedures.  Patient's individual exercise prescription and treatment plan were reviewed.  All starting workloads were established based on the results of the 6 minute walk test done at initial orientation visit.  The plan for exercise progression was also introduced and progression will be customized based on patient's performance and goals          Exercise Goals and Review: Exercise Goals    Row Name 03/06/18 1218             Exercise Goals   Increase Physical  Activity  Yes       Intervention  Provide advice, education, support  and counseling about physical activity/exercise needs.;Develop an individualized exercise prescription for aerobic and resistive training based on initial evaluation findings, risk stratification, comorbidities and participant's personal goals.       Expected Outcomes  Short Term: Attend rehab on a regular basis to increase amount of physical activity.;Long Term: Add in home exercise to make exercise part of routine and to increase amount of physical activity.;Long Term: Exercising regularly at least 3-5 days a week.       Increase Strength and Stamina  Yes       Intervention  Provide advice, education, support and counseling about physical activity/exercise needs.;Develop an individualized exercise prescription for aerobic and resistive training based on initial evaluation findings, risk stratification, comorbidities and participant's personal goals.       Expected Outcomes  Short Term: Increase workloads from initial exercise prescription for resistance, speed, and METs.;Short Term: Perform resistance training exercises routinely during rehab and add in resistance training at home;Long Term: Improve cardiorespiratory fitness, muscular endurance and strength as measured by increased METs and functional capacity (6MWT)       Able to understand and use rate of perceived exertion (RPE) scale  Yes       Intervention  Provide education and explanation on how to use RPE scale       Expected Outcomes  Short Term: Able to use RPE daily in rehab to express subjective intensity level;Long Term:  Able to use RPE to guide intensity level when exercising independently       Able to understand and use Dyspnea scale  Yes       Intervention  Provide education and explanation on how to use Dyspnea scale       Expected Outcomes  Short Term: Able to use Dyspnea scale daily in rehab to express subjective sense of shortness of breath during exertion;Long Term: Able to use Dyspnea scale to guide intensity level when exercising  independently       Knowledge and understanding of Target Heart Rate Range (THRR)  Yes       Intervention  Provide education and explanation of THRR including how the numbers were predicted and where they are located for reference       Expected Outcomes  Short Term: Able to state/look up THRR;Short Term: Able to use daily as guideline for intensity in rehab;Long Term: Able to use THRR to govern intensity when exercising independently       Able to check pulse independently  Yes       Intervention  Provide education and demonstration on how to check pulse in carotid and radial arteries.;Review the importance of being able to check your own pulse for safety during independent exercise       Expected Outcomes  Short Term: Able to explain why pulse checking is important during independent exercise;Long Term: Able to check pulse independently and accurately       Understanding of Exercise Prescription  Yes       Intervention  Provide education, explanation, and written materials on patient's individual exercise prescription       Expected Outcomes  Short Term: Able to explain program exercise prescription;Long Term: Able to explain home exercise prescription to exercise independently          Exercise Goals Re-Evaluation : Exercise Goals Re-Evaluation    Row Name 03/14/18 1126 03/20/18 1447 03/28/18 1212  Exercise Goal Re-Evaluation   Exercise Goals Review  Increase Physical Activity;Increase Strength and Stamina;Able to understand and use rate of perceived exertion (RPE) scale;Able to understand and use Dyspnea scale;Understanding of Exercise Prescription;Knowledge and understanding of Target Heart Rate Range (THRR)  Increase Physical Activity;Increase Strength and Stamina;Understanding of Exercise Prescription  Increase Physical Activity;Increase Strength and Stamina;Understanding of Exercise Prescription     Comments  Reviewed RPE scale, THR and program prescription with pt today.  Pt  voiced understanding and was given a copy of goals to take home.   Keala is off to a good start in rehab.  She has completed two full days of exercise.  All of her workloads are between 13-15 on the RPE scale.  We will continue to monitor her progress.   Reviewed home exercise with pt today.  Pt plans to walk at home and stores for exercise.  She is going to start by adding in one extra day at home to get to three days a week.  Reviewed THR, pulse, RPE, sign and symptoms, and when to call 911 or MD.  Also discussed weather considerations and indoor options.  Pt voiced understanding.     Expected Outcomes  Short: Use RPE daily to regulate intensity. Long: Follow program prescription in THR.  Short: Continue to attend rehab regularly.  Long: Continue to follow program prescription.  Short: Add in at least one extra day a week next week.  Long: Start to exercise independently        Discharge Exercise Prescription (Final Exercise Prescription Changes): Exercise Prescription Changes - 03/28/18 1200      Home Exercise Plan   Plans to continue exercise at  Home (comment)   walking   Frequency  Add 1 additional day to program exercise sessions.    Initial Home Exercises Provided  03/28/18       Nutrition:  Target Goals: Understanding of nutrition guidelines, daily intake of sodium <1578m, cholesterol <2068m calories 30% from fat and 7% or less from saturated fats, daily to have 5 or more servings of fruits and vegetables.  Biometrics: Pre Biometrics - 03/06/18 1218      Pre Biometrics   Height  5' (1.524 m)    Weight  142 lb 14.4 oz (64.8 kg)    Waist Circumference  35 inches    Hip Circumference  41.5 inches    Waist to Hip Ratio  0.84 %    BMI (Calculated)  27.91        Nutrition Therapy Plan and Nutrition Goals: Nutrition Therapy & Goals - 03/19/18 1214      Nutrition Therapy   Diet  TLC    Drug/Food Interactions  Purine/Gout    Protein (specify units)  5-6oz    Fiber  20 grams      Whole Grain Foods  2 servings   3 ideal   Saturated Fats  10 max. grams    Fruits and Vegetables  5 servings/day   8 ideal, eats small portions   Sodium  1500 grams      Personal Nutrition Goals   Nutrition Goal  On days when appetite is decreased, try to snack consistently between meals to help meet nutritional needs and maintain CBW    Personal Goal #2  Continue to monitor fluid and sodium intake r/t CKD as directed by your nephrologist    Comments  States her appetite varies day to day, but her eating pattern is typically a larger breakfast, small lunch and  moderate dinner with snacks semi-regularly      Intervention Plan   Intervention  Prescribe, educate and counsel regarding individualized specific dietary modifications aiming towards targeted core components such as weight, hypertension, lipid management, diabetes, heart failure and other comorbidities.    Expected Outcomes  Short Term Goal: Understand basic principles of dietary content, such as calories, fat, sodium, cholesterol and nutrients.;Short Term Goal: A plan has been developed with personal nutrition goals set during dietitian appointment.;Long Term Goal: Adherence to prescribed nutrition plan.       Nutrition Assessments: Nutrition Assessments - 03/06/18 1138      MEDFICTS Scores   Pre Score  36       Nutrition Goals Re-Evaluation: Nutrition Goals Re-Evaluation    [place]y Name 03/19/18 1227             Goals   Nutrition Goal  On days when appetite is decreased, try to snack consistently between meals to help meet nutritional needs and maintain CBW       Comment  Reports her appeitite to vary day-to-day but feels that she tries to eat a variety of foods       Expected Outcome  She will eat on a consistent schedule each day, prioritizing total calories and protein intake         Personal Goal #2 Re-Evaluation   Personal Goal #2  Continue to monitor fluid and sodium intake r/t CKD as directed by your  nephrologist          Nutrition Goals Discharge (Final Nutrition Goals Re-Evaluation): Nutrition Goals Re-Evaluation - 03/19/18 1227      Goals   Nutrition Goal  On days when appetite is decreased, try to snack consistently between meals to help meet nutritional needs and maintain CBW    Comment  Reports her appeitite to vary day-to-day but feels that she tries to eat a variety of foods    Expected Outcome  She will eat on a consistent schedule each day, prioritizing total calories and protein intake      Personal Goal #2 Re-Evaluation   Personal Goal #2  Continue to monitor fluid and sodium intake r/t CKD as directed by your nephrologist       Psychosocial: Target Goals: Acknowledge presence or absence of significant depression and/or stress, maximize coping skills, provide positive support system. Participant is able to verbalize types and ability to use techniques and skills needed for reducing stress and depression.   Initial Review & Psychosocial Screening: Initial Psych Review & Screening - 03/06/18 1135      Initial Review   Current issues with  Current Sleep Concerns;Current Stress Concerns    Source of Stress Concerns  Chronic Illness    Comments  She gets short of breath walking.      Family Dynamics   Good Support System?  Yes    Comments  She can look to her daugher Tammy for support      Barriers   Psychosocial barriers to participate in program  The patient should benefit from training in stress management and relaxation.      Screening Interventions   Interventions  Encouraged to exercise;Provide feedback about the scores to participant;Program counselor consult;To provide support and resources with identified psychosocial needs    Expected Outcomes  Short Term goal: Utilizing psychosocial counselor, staff and physician to assist with identification of specific Stressors or current issues interfering with healing process. Setting desired goal for each stressor or  current issue identified.;Long Term  Goal: Stressors or current issues are controlled or eliminated.;Short Term goal: Identification and review with participant of any Quality of Life or Depression concerns found by scoring the questionnaire.;Long Term goal: The participant improves quality of Life and PHQ9 Scores as seen by post scores and/or verbalization of changes       Quality of Life Scores:  Scores of 19 and below usually indicate a poorer quality of life in these areas.  A difference of  2-3 points is a clinically meaningful difference.  A difference of 2-3 points in the total score of the Quality of Life Index has been associated with significant improvement in overall quality of life, self-image, physical symptoms, and general health in studies assessing change in quality of life.  PHQ-9: Recent Review Flowsheet Data    Depression screen [facility] 2/9 03/08/2018 03/06/2018 09/28/2017 07/26/2016 01/29/2015   Decreased Interest 0 1 1 0 0   Down, Depressed, Hopeless 1  1 3  0 0   PHQ - 2 Score 1 2 4  0 0   Altered sleeping 2 2 2  - -   Tired, decreased energy 2 2 3  - -   Change in appetite 1 2 2  - -   Feeling bad or failure about yourself  0 0 0 - -   Trouble concentrating 0 0 0 - -   Moving slowly or fidgety/restless 0 0 0 - -   Suicidal thoughts 0 0 0 - -   PHQ-9 Score 6 8 11  - -   Difficult doing work/chores Not difficult at all Not difficult at all Somewhat difficult - -     Interpretation of Total Score  Total Score Depression Severity:  1-4 = Minimal depression, 5-9 = Mild depression, 10-14 = Moderate depression, 15-19 = Moderately severe depression, 20-27 = Severe depression   Psychosocial Evaluation and Intervention: Psychosocial Evaluation - 03/14/18 1230      Psychosocial Evaluation & Interventions   Interventions  Encouraged to exercise with the program and follow exercise prescription;Stress management education    Comments  Counselor met with Ms. Enid Derry today Romie Minus) for  initial psychsocial evaluation.  She is an 82 (almost 60) year old who has COPD.  Although Analeigha lives alone she has a strong support system with a daughter; close friends and church family who are very involved in her life.  In addition to her pulmonary condition, Anberlyn mentioned a heart and kidney condition - but was unclear about details on those.  She reports sleeping "fair" with ~6 hours most nights of the week.  She is on medication that helps with this.  Charonda reports her appetite has been up and down lately.  She states that anxiety symptoms have been a problem more recently and she was put on medication by her Dr. that "made me sick."  So it was discontinued about a month ago.  She is typically in a positive mood most of the time and reports minimal stress other than her health and living alone - caring for the house, etc by herself.  Ader became tearful today as she mentioned the loss of one of her daughter approximately 18 months ago to Bone Cancer.  She relies on her faith and her support system to help her through this.  Angeleigh has goals to walk and breathe better while in this program.  Staff will follow with her.    Expected Outcomes  Short:  Jaimy will exercise consistently for her stamina and strength and to help with sleep and  stress management   Long:  Shruthi will develop a routine of exercise to have a better quality of life.      Continue Psychosocial Services   Follow up required by staff       Psychosocial Re-Evaluation:   Psychosocial Discharge (Final Psychosocial Re-Evaluation):   Education: Education Goals: Education classes will be provided on a weekly basis, covering required topics. Participant will state understanding/return demonstration of topics presented.  Learning Barriers/Preferences: Learning Barriers/Preferences - 03/06/18 1145      Learning Barriers/Preferences   Learning Barriers  Sight   wears glasses   Learning Preferences  None       Education  Topics:  Initial Evaluation Education: - Verbal, written and demonstration of respiratory meds, oximetry and breathing techniques. Instruction on use of nebulizers and MDIs and importance of monitoring MDI activations.   Pulmonary Rehab from 03/28/2018 in [facility] Cardiac and Pulmonary Rehab  Date  03/06/18  Educator  [facility]  Instruction Review Code  1- Verbalizes Understanding      General Nutrition Guidelines/Fats and Fiber: -Group instruction provided by verbal, written material, models and posters to present the general guidelines for heart healthy nutrition. Gives an explanation and review of dietary fats and fiber.   Controlling Sodium/Reading Food Labels: -Group verbal and written material supporting the discussion of sodium use in heart healthy nutrition. Review and explanation with models, verbal and written materials for utilization of the food label.   Exercise Physiology & General Exercise Guidelines: - Group verbal and written instruction with models to review the exercise physiology of the cardiovascular system and associated critical values. Provides general exercise guidelines with specific guidelines to those with heart or lung disease.    Aerobic Exercise & Resistance Training: - Gives group verbal and written instruction on the various components of exercise. Focuses on aerobic and resistive training programs and the benefits of this training and how to safely progress through these programs.   Flexibility, Balance, Mind/Body Relaxation: Provides group verbal/written instruction on the benefits of flexibility and balance training, including mind/body exercise modes such as yoga, pilates and tai chi.  Demonstration and skill practice provided.   Stress and Anxiety: - Provides group verbal and written instruction about the health risks of elevated stress and causes of high stress.  Discuss the correlation between heart/lung disease and anxiety and treatment options. Review  healthy ways to manage with stress and anxiety.   Pulmonary Rehab from 03/28/2018 in [facility] Cardiac and Pulmonary Rehab  Date  03/14/18  Educator  [facility]  Instruction Review Code  1- Verbalizes Understanding      Depression: - Provides group verbal and written instruction on the correlation between heart/lung disease and depressed mood, treatment options, and the stigmas associated with seeking treatment.   Exercise & Equipment Safety: - Individual verbal instruction and demonstration of equipment use and safety with use of the equipment.   Pulmonary Rehab from 03/28/2018 in [facility] Cardiac and Pulmonary Rehab  Date  03/06/18  Educator  [facility]  Instruction Review Code  1- Verbalizes Understanding      Infection Prevention: - Provides verbal and written material to individual with discussion of infection control including proper hand washing and proper equipment cleaning during exercise session.   Pulmonary Rehab from 03/28/2018 in [facility] Cardiac and Pulmonary Rehab  Date  03/06/18  Educator  [facility]  Instruction Review Code  1- Verbalizes Understanding      Falls Prevention: - Provides verbal and written material to individual with discussion of falls  prevention and safety.   Pulmonary Rehab from 03/28/2018 in [facility] Cardiac and Pulmonary Rehab  Date  03/06/18  Educator  [facility]  Instruction Review Code  1- Verbalizes Understanding      Diabetes: - Individual verbal and written instruction to review signs/symptoms of diabetes, desired ranges of glucose level fasting, after meals and with exercise. Advice that pre and post exercise glucose checks will be done for 3 sessions at entry of program.   Chronic Lung Diseases: - Group verbal and written instruction to review updates, respiratory medications, advancements in procedures and treatments. Discuss use of supplemental oxygen including available portable oxygen systems, continuous and intermittent flow rates, concentrators, personal use and safety  guidelines. Review proper use of inhaler and spacers. Provide informative websites for self-education.    Energy Conservation: - Provide group verbal and written instruction for methods to conserve energy, plan and organize activities. Instruct on pacing techniques, use of adaptive equipment and posture/positioning to relieve shortness of breath.   Pulmonary Rehab from 03/28/2018 in [facility] Cardiac and Pulmonary Rehab  Date  03/28/18  Educator  [facility]  Instruction Review Code  1- Verbalizes Understanding      Triggers and Exacerbations: - Group verbal and written instruction to review types of environmental triggers and ways to prevent exacerbations. Discuss weather changes, air quality and the benefits of nasal washing. Review warning signs and symptoms to help prevent infections. Discuss techniques for effective airway clearance, coughing, and vibrations.   AED/CPR: - Group verbal and written instruction with the use of models to demonstrate the basic use of the AED with the basic ABC's of resuscitation.   Anatomy and Physiology of the Lungs: - Group verbal and written instruction with the use of models to provide basic lung anatomy and physiology related to function, structure and complications of lung disease.   Anatomy & Physiology of the Heart: - Group verbal and written instruction and models provide basic cardiac anatomy and physiology, with the coronary electrical and arterial systems. Review of Valvular disease and Heart Failure   Cardiac Medications: - Group verbal and written instruction to review commonly prescribed medications for heart disease. Reviews the medication, class of the drug, and side effects.   Pulmonary Rehab from 03/28/2018 in [facility] Cardiac and Pulmonary Rehab  Date  03/21/18  Educator  [facility]  Instruction Review Code  1- Verbalizes Understanding      Know Your Numbers and Risk Factors: -Group verbal and written instruction about important numbers in your health.   Discussion of what are risk factors and how they play a role in the disease process.  Review of Cholesterol, Blood Pressure, Diabetes, and BMI and the role they play in your overall health.   Sleep Hygiene: -Provides group verbal and written instruction about how sleep can affect your health.  Define sleep hygiene, discuss sleep cycles and impact of sleep habits. Review good sleep hygiene tips.    Other: -Provides group and verbal instruction on various topics (see comments)    Knowledge Questionnaire Score: Knowledge Questionnaire Score - 03/06/18 1140      Knowledge Questionnaire Score   Pre Score  11/18   reviewed with patient       Core Components/Risk Factors/Patient Goals at Admission: Personal Goals and Risk Factors at Admission - 03/06/18 1146      Core Components/Risk Factors/Patient Goals on Admission    Weight Management  Yes;Weight Maintenance;Weight Loss    Intervention  Weight Management: Develop a combined nutrition and exercise program designed to  reach desired caloric intake, while maintaining appropriate intake of nutrient and fiber, sodium and fats, and appropriate energy expenditure required for the weight goal.;Weight Management: Provide education and appropriate resources to help participant work on and attain dietary goals.;Weight Management/Obesity: Establish reasonable short term and long term weight goals.    Admit Weight  142 lb 14.4 oz (64.8 kg)    Goal Weight: Short Term  135 lb (61.2 kg)    Goal Weight: Long Term  135 lb (61.2 kg)    Expected Outcomes  Short Term: Continue to assess and modify interventions until short term weight is achieved;Long Term: Adherence to nutrition and physical activity/exercise program aimed toward attainment of established weight goal;Weight Maintenance: Understanding of the daily nutrition guidelines, which includes 25-35% calories from fat, 7% or less cal from saturated fats, less than 270m cholesterol, less than 1.5gm of  sodium, & 5 or more servings of fruits and vegetables daily;Understanding recommendations for meals to include 15-35% energy as protein, 25-35% energy from fat, 35-60% energy from carbohydrates, less than 2082mof dietary cholesterol, 20-35 gm of total fiber daily;Understanding of distribution of calorie intake throughout the day with the consumption of 4-5 meals/snacks;Weight Loss: Understanding of general recommendations for a balanced deficit meal plan, which promotes 1-2 lb weight loss per week and includes a negative energy balance of [phone] kcal/d    Improve shortness of breath with ADL's  Yes    Intervention  Provide education, individualized exercise plan and daily activity instruction to help decrease symptoms of SOB with activities of daily living.    Expected Outcomes  Short Term: Improve cardiorespiratory fitness to achieve a reduction of symptoms when performing ADLs;Long Term: Be able to perform more ADLs without symptoms or delay the onset of symptoms    Hypertension  Yes    Intervention  Provide education on lifestyle modifcations including regular physical activity/exercise, weight management, moderate sodium restriction and increased consumption of fresh fruit, vegetables, and low fat dairy, alcohol moderation, and smoking cessation.;Monitor prescription use compliance.    Expected Outcomes  Short Term: Continued assessment and intervention until BP is < 140/9078mG in hypertensive participants. < 130/53m47m in hypertensive participants with diabetes, heart failure or chronic kidney disease.;Long Term: Maintenance of blood pressure at goal levels.       Core Components/Risk Factors/Patient Goals Review:    Core Components/Risk Factors/Patient Goals at Discharge (Final Review):    ITP Comments: ITP Comments    Row Name 03/06/18 1110 04/02/18 0842         ITP Comments  Medical Evaluation completed. Chart sent for review and changes to Dr. [place]ily Filber[place]ctor of [place] [facility] encounter 08/14/17  30 day review completed. ITP sent to Dr. MarkEmily Filbertector of [facility] with ITP unless changes are made by physician         Comments: 30 day review

## 2018-04-02 NOTE — Progress Notes (Signed)
Daily Session Note  Patient Details  Name: Savannah Wilson MRN: [id number] Date of Birth: 28-Mar-1929 Referring Provider:     Pulmonary Rehab from 03/06/2018 in [facility] Cardiac and Pulmonary Rehab  Referring Provider  Derrel Nip      Encounter Date: 04/02/2018  Check In:      Social History   Tobacco Use  Smoking Status Never Smoker  Smokeless Tobacco Never Used    Goals Met:  Proper associated with RPD/PD & O2 Sat Independence with exercise equipment Exercise tolerated well Strength training completed today  Goals Unmet:  Not Applicable  Comments: Pt able to follow exercise prescription today without complaint.  Will continue to monitor for progression.    Dr. Emily Filbert is Medical Director for [place] and [facility].

## 2018-04-11 ENCOUNTER — Encounter: Payer: PPO | Attending: Internal Medicine | Admitting: *Deleted

## 2018-04-11 DIAGNOSIS — Z79899 Other long term (current) drug therapy: Secondary | ICD-10-CM | POA: Insufficient documentation

## 2018-04-11 DIAGNOSIS — E785 Hyperlipidemia, unspecified: Secondary | ICD-10-CM | POA: Diagnosis not present

## 2018-04-11 DIAGNOSIS — N184 Chronic kidney disease, stage 4 (severe): Secondary | ICD-10-CM | POA: Diagnosis not present

## 2018-04-11 DIAGNOSIS — Z79890 Hormone replacement therapy (postmenopausal): Secondary | ICD-10-CM | POA: Insufficient documentation

## 2018-04-11 DIAGNOSIS — R0609 Other forms of dyspnea: Secondary | ICD-10-CM

## 2018-04-11 DIAGNOSIS — I129 Hypertensive chronic kidney disease with stage 1 through stage 4 chronic kidney disease, or unspecified chronic kidney disease: Secondary | ICD-10-CM | POA: Insufficient documentation

## 2018-04-11 DIAGNOSIS — K219 Gastro-esophageal reflux disease without esophagitis: Secondary | ICD-10-CM | POA: Diagnosis not present

## 2018-04-11 NOTE — Progress Notes (Signed)
Daily Session Note  Patient Details  Name: Savannah Wilson MRN: [id number] Date of Birth: [birth year] Referring Provider:     Pulmonary Rehab from 03/06/2018 in [facility] Cardiac and Pulmonary Rehab  Referring Provider  Savannah Wilson      Encounter Date: 04/11/2018  Check In: Session Check In - 04/11/18 1117      Check-In   Supervising physician immediately available to respond to emergencies  [facility] immediately available ER MD    Physician(s)   Dr. Alfred Levins and Jimmye Norman    Location  [facility]-Cardiac & Pulmonary Rehab    Staff Present  Renita Papa, RN BSN;Kresha Abelson Luan Pulling, MA, RCEP, CCRP, Exercise Physiologist;Joseph Tessie Fass RCP,RRT,BSRT    Medication changes reported      No    Fall or balance concerns reported     No    Warm-up and Cool-down  Performed as group-led instruction    Resistance Training Performed  Yes    VAD Patient?  No    PAD/SET Patient?  No      Pain Assessment   Currently in Pain?  No/denies          Social History   Tobacco Use  Smoking Status Never Smoker  Smokeless Tobacco Never Used    Goals Met:  Proper associated with RPD/PD & O2 Sat Independence with exercise equipment Using PLB without cueing & demonstrates good technique Exercise tolerated well No report of cardiac concerns or symptoms Strength training completed today  Goals Unmet:  Not Applicable  Comments: Pt able to follow exercise prescription today without complaint.  Will continue to monitor for progression.    Dr. Emily Filbert is Medical Director for [place] and [facility].

## 2018-04-16 ENCOUNTER — Other Ambulatory Visit: Payer: Self-pay | Admitting: Physician Assistant

## 2018-04-16 DIAGNOSIS — R0609 Other forms of dyspnea: Secondary | ICD-10-CM | POA: Diagnosis not present

## 2018-04-16 NOTE — Progress Notes (Signed)
Daily Session Note  Patient Details  Name: Savannah Wilson MRN: [id number] Date of Birth: 1929/02/02 Referring Provider:     Pulmonary Rehab from 03/06/2018 in [facility] Cardiac and Pulmonary Rehab  Referring Provider  Derrel Nip      Encounter Date: 04/16/2018  Check In:      Social History   Tobacco Use  Smoking Status Never Smoker  Smokeless Tobacco Never Used    Goals Met:  Independence with exercise equipment Exercise tolerated well No report of cardiac concerns or symptoms Strength training completed today  Goals Unmet:  Not Applicable  Comments: Pt able to follow exercise prescription today without complaint.  Will continue to monitor for progression.    Dr. Emily Filbert is Medical Director for [place] and [facility].

## 2018-04-18 ENCOUNTER — Encounter: Payer: PPO | Admitting: *Deleted

## 2018-04-18 DIAGNOSIS — R0609 Other forms of dyspnea: Secondary | ICD-10-CM | POA: Diagnosis not present

## 2018-04-18 NOTE — Progress Notes (Signed)
Daily Session Note  Patient Details  Name: Savannah Wilson: [id number] Date of Birth: 05-14-1929 Referring Provider:     Pulmonary Rehab from 03/06/2018 in [facility] Cardiac and Pulmonary Rehab  Referring Provider  [name]      Encounter Date: 04/18/2018  Check In: Session Check In - 04/18/18 1133      Check-In   Supervising physician immediately available to respond to emergencies  [facility] immediately available ER MD    Physician(s)  Dr. Darl Householder and Jimmye Norman    Location  [facility]-Cardiac & Pulmonary Rehab    Staff Present  Alberteen Sam, MA, RCEP, CCRP, Exercise Physiologist;Hillis Mcphatter Sherryll Burger, RN BSN;Joseph Hood RCP,RRT,BSRT    Medication changes reported      No    Fall or balance concerns reported     No    Warm-up and Cool-down  Performed as group-led Higher education careers adviser Performed  Yes    VAD Patient?  No    PAD/SET Patient?  No      Pain Assessment   Currently in Pain?  No/denies          Social History   Tobacco Use  Smoking Status Never Smoker  Smokeless Tobacco Never Used    Goals Met:  Proper associated with RPD/PD & O2 Sat Independence with exercise equipment Using PLB without cueing & demonstrates good technique Exercise tolerated well No report of cardiac concerns or symptoms Strength training completed today  Goals Unmet:  Not Applicable  Comments: Pt able to follow exercise prescription today without complaint.  Will continue to monitor for progression.    Dr. Emily Filbert is Medical Director for [place] and [facility].

## 2018-04-23 DIAGNOSIS — R0609 Other forms of dyspnea: Secondary | ICD-10-CM | POA: Diagnosis not present

## 2018-04-23 NOTE — Progress Notes (Signed)
Daily Session Note  Patient Details  Name: Savannah Wilson MRN: [id number] Date of Birth: 08-23-1928 Referring Provider:     Pulmonary Rehab from 03/06/2018 in [facility] Cardiac and Pulmonary Rehab  Referring Provider  Derrel Nip      Encounter Date: 04/23/2018  Check In:      Social History   Tobacco Use  Smoking Status Never Smoker  Smokeless Tobacco Never Used    Goals Met:  Proper associated with RPD/PD & O2 Sat Exercise tolerated well Personal goals reviewed Strength training completed today  Goals Unmet:  Not Applicable  Comments: Pt able to follow exercise prescription today without complaint.  Will continue to monitor for progression.    Dr. Emily Filbert is Medical Director for [place] and [facility].

## 2018-04-30 DIAGNOSIS — R0609 Other forms of dyspnea: Principal | ICD-10-CM

## 2018-04-30 NOTE — Progress Notes (Signed)
Pulmonary Individual Treatment Plan  Patient Details  Name: ABAGALE BOULOS MRN: [id number] Date of Birth: [birth year] Referring Provider:     Pulmonary Rehab from 03/06/2018 in [facility] Cardiac and Pulmonary Rehab  Referring Provider  [name]      Initial Encounter Date:    Pulmonary Rehab from 03/06/2018 in [facility] Cardiac and Pulmonary Rehab  Date  03/06/18      Visit Diagnosis: Dyspnea on exertion  Patient's Home Medications on Admission:  Current Outpatient Medications:  .  acetaminophen (TYLENOL) 650 MG CR tablet, Take 650 mg by mouth every 8 (eight) hours as needed for pain. , Disp: , Rfl:  .  ALPRAZolam (XANAX) 0.5 MG tablet, Take 1 tablet (0.5 mg total) by mouth 2 (two) times daily as needed for anxiety., Disp: 45 tablet, Rfl: 5 .  apixaban (ELIQUIS) 2.5 MG TABS tablet, Take 1 tablet (2.5 mg total) by mouth 2 (two) times daily., Disp: 180 tablet, Rfl: 3 .  benzonatate (TESSALON) 200 MG capsule, Take 1 capsule (200 mg total) by mouth 3 (three) times daily as needed for cough., Disp: 60 capsule, Rfl: 1 .  budesonide (PULMICORT) 0.25 MG/2ML nebulizer solution, Take 2 mLs (0.25 mg total) by nebulization 2 (two) times daily., Disp: 60 mL, Rfl: 12 .  Cholecalciferol (VITAMIN D-3) 1000 units CAPS, Take 1,000 Units by mouth daily. , Disp: , Rfl:  .  escitalopram (LEXAPRO) 10 MG tablet, Take 1 tablet (10 mg total) by mouth daily. After dinner, Disp: 90 tablet, Rfl: 1 .  fluticasone (FLONASE) 50 MCG/ACT nasal spray, Place 2 sprays into the nose daily. (Patient taking differently: Place 2 sprays into the nose daily as needed (For allergies.). ), Disp: 16 g, Rfl: 6 .  furosemide (LASIX) 20 MG tablet, TAKE ONE TABLET BY MOUTH EVERY DAY, Disp: 90 tablet, Rfl: 0 .  furosemide (LASIX) 40 MG tablet, Take 40 mg by mouth daily., Disp: , Rfl:  .  hydrALAZINE (APRESOLINE) 50 MG tablet, Take 1 tablet (50 mg total) by mouth 2 (two) times daily. for hypertension, Disp: 180 tablet, Rfl: 3 .  ipratropium-albuterol  (DUONEB) 0.5-2.5 (3) MG/3ML SOLN, Take 3 mLs by nebulization every 6 (six) hours as needed., Disp: 360 mL, Rfl: 0 .  levothyroxine (SYNTHROID, LEVOTHROID) 100 MCG tablet, Take 1 tablet (100 mcg total) by mouth daily. For thyroid, Disp: 90 tablet, Rfl: 0 .  losartan (COZAAR) 50 MG tablet, Take 1 tablet (50 mg total) by mouth daily., Disp: 90 tablet, Rfl: 0 .  omeprazole (PRILOSEC) 40 MG capsule, TAKE ONE CAPSULE BY MOUTH TWICE A DAY. BEFORE BREAKFAST AND SUPPER, Disp: 180 capsule, Rfl: 1 .  PROAIR HFA 108 (90 BASE) MCG/ACT inhaler, INHALE 2 PUFFS EVERY 6 HOURS AS NEEDED FOR WHEEZING, Disp: 8.5 g, Rfl: 3 .  Spacer/Aero-Holding Chambers (OPTICHAMBER ADVANTAGE) MISC, 1 each by Other route once. Always uses her when you're using a metered-dose inhaler. You've aromatase medicine as much, he won't have his much side effect, but you it twice as much medicine and your lungs., Disp: 1 each, Rfl: 0 .  temazepam (RESTORIL) 15 MG capsule, TAKE 1 CAPSULE BY MOUTH AT BEDTIME AS NEEDED FOR SLEEP, Disp: 30 capsule, Rfl: 2 .  vitamin B-12 (CYANOCOBALAMIN) 1000 MCG tablet, Take 1,000 mcg by mouth daily., Disp: , Rfl:   Past Medical History: Past Medical History:  Diagnosis Date  . Asthma   . Barrett esophagus   . Chronic bronchitis   . Chronic kidney disease (CKD), stage IV (severe) ([place])   .  Depression   . Diverticulosis   . GERD (gastroesophageal reflux disease)   . Gout   . Hyperlipidemia   . Hypertension   . Hypothyroid   . Mitral regurgitation    a. TTE 02/2017: EF 55-60%, normal WM, calcified mitral annulus with mod regurg, moderately dilated LA, unable to estimate PASP  . Pancreatitis due to biliary obstruction March 2010   s/p ERCP sphincterotomy, cholecystectomy  . Peripheral neuralgia   . Persistent atrial fibrillation ([place])    a. diagnosed 01/24/17; CHADS2VASc => 5 (HTN, age x 2, vascular disease with PAD and aortic plaque, female) giving her an estimated annual stroke risk of 6.7%; b. successful  DCCV 03/28/2017; c. on Eliquis  . Venous insufficiency     Tobacco Use: Social History   Tobacco Use  Smoking Status Never Smoker  Smokeless Tobacco Never Used    Labs: Recent Review Flowsheet Data    Labs for ITP Cardiac and Pulmonary Rehab Latest Ref Rng & Units 10/28/2013 06/16/2016   Cholestrol 0 - 200 mg/dL 116 124   LDLCALC 0 - 99 mg/dL 58 64   HDL >39.00 mg/dL 26.30(L) 42.50   Trlycerides 0.0 - 149.0 mg/dL 159.0(H) 87.0   Hemoglobin A1c 4.6 - 6.5 % - 5.0       Pulmonary Assessment Scores: Pulmonary Assessment Scores    Row Name 03/06/18 1112         ADL UCSD   ADL Phase  Entry     SOB Score total  55     Rest  0     Walk  2     Stairs  4     Bath  1     Dress  1     Shop  4       CAT Score   CAT Score  17       mMRC Score   mMRC Score  2        Pulmonary Function Assessment: Pulmonary Function Assessment - 03/06/18 1140      Breath   Bilateral Breath Sounds  Clear    Shortness of Breath  Yes;Limiting activity       Exercise Target Goals: Exercise Program Goal: Individual exercise prescription set using results from initial 6 min walk test and THRR while considering  patient's activity barriers and safety.   Exercise Prescription Goal: Initial exercise prescription builds to 30-45 minutes a day of aerobic activity, 2-3 days per week.  Home exercise guidelines will be given to patient during program as part of exercise prescription that the participant will acknowledge.  Activity Barriers & Risk Stratification:   6 Minute Walk: 6 Minute Walk    Row Name 03/06/18 1219         6 Minute Walk   Distance  610 feet     Walk Time  5.6 minutes     # of Rest Breaks  1     MPH  1.23     METS  0.6     RPE  13     Perceived Dyspnea   3     VO2 Peak  2.1     Symptoms  No     Resting HR  67 bpm     Resting BP  138/68     Resting Oxygen Saturation   97 %     Exercise Oxygen Saturation  during 6 min walk  95 %     Max Ex. HR  92 bpm  Max  Ex. BP  138/60     2 Minute Post BP  130/64       Interval HR   1 Minute HR  86     2 Minute HR  89     3 Minute HR  92     4 Minute HR  84     5 Minute HR  88     6 Minute HR  88     2 Minute Post HR  64     Interval Heart Rate?  Yes       Interval Oxygen   Interval Oxygen?  Yes     Baseline Oxygen Saturation %  97 %     1 Minute Oxygen Saturation %  95 %     1 Minute Liters of Oxygen  0 L     2 Minute Oxygen Saturation %  95 %     2 Minute Liters of Oxygen  0 L     3 Minute Oxygen Saturation %  96 %     3 Minute Liters of Oxygen  0 L     4 Minute Oxygen Saturation %  96 %     4 Minute Liters of Oxygen  0 L     5 Minute Oxygen Saturation %  95 %     5 Minute Liters of Oxygen  0 L     6 Minute Oxygen Saturation %  96 %     6 Minute Liters of Oxygen  0 L     2 Minute Post Oxygen Saturation %  97 %     2 Minute Post Liters of Oxygen  0 L       Oxygen Initial Assessment: Oxygen Initial Assessment - 03/06/18 1145      Home Oxygen   Home Oxygen Device  None    Sleep Oxygen Prescription  None    Home Exercise Oxygen Prescription  None    Home at Rest Exercise Oxygen Prescription  None      Initial 6 min Walk   Oxygen Used  None      Program Oxygen Prescription   Program Oxygen Prescription  None      Intervention   Short Term Goals  To learn and demonstrate proper use of respiratory medications;To learn and understand importance of maintaining oxygen saturations>88%;To learn and demonstrate proper pursed lip breathing techniques or other breathing techniques.;To learn and understand importance of monitoring SPO2 with pulse oximeter and demonstrate accurate use of the pulse oximeter.    Long  Term Goals  Verbalizes importance of monitoring SPO2 with pulse oximeter and return demonstration;Maintenance of O2 saturations>88%;Compliance with respiratory medication;Demonstrates proper use of MDI's;Exhibits proper breathing techniques, such as pursed lip breathing or other method  taught during program session       Oxygen Re-Evaluation: Oxygen Re-Evaluation    Row Name 03/14/18 1126 04/11/18 1520           Program Oxygen Prescription   Program Oxygen Prescription  None  None        Home Oxygen   Home Oxygen Device  None  None      Sleep Oxygen Prescription  None  None      Home Exercise Oxygen Prescription  None  None      Home at Rest Exercise Oxygen Prescription  None  None        Goals/Expected Outcomes   Short Term Goals  To learn and demonstrate proper  use of respiratory medications;To learn and understand importance of maintaining oxygen saturations>88%;To learn and demonstrate proper pursed lip breathing techniques or other breathing techniques.;To learn and understand importance of monitoring SPO2 with pulse oximeter and demonstrate accurate use of the pulse oximeter.  To learn and demonstrate proper use of respiratory medications;To learn and understand importance of maintaining oxygen saturations>88%;To learn and demonstrate proper pursed lip breathing techniques or other breathing techniques.;To learn and understand importance of monitoring SPO2 with pulse oximeter and demonstrate accurate use of the pulse oximeter.      Long  Term Goals  Verbalizes importance of monitoring SPO2 with pulse oximeter and return demonstration;Maintenance of O2 saturations>88%;Compliance with respiratory medication;Demonstrates proper use of MDI's;Exhibits proper breathing techniques, such as pursed lip breathing or other method taught during program session  Verbalizes importance of monitoring SPO2 with pulse oximeter and return demonstration;Maintenance of O2 saturations>88%;Compliance with respiratory medication;Demonstrates proper use of MDI's;Exhibits proper breathing techniques, such as pursed lip breathing or other method taught during program session      Comments  Reviewed PLB technique with pt.  Talked about how it work and it's important to maintaining his exercise  saturations.    Patient checks her oxygen once in awhile when she is at home. Talked about the importance of checking her oxygen at home and making sure she is 88 percent or above.      Goals/Expected Outcomes  Short: Become more profiecient at using PLB.   Long: Become independent at using PLB.  Short: monitor oxygen at home with exertion. Long: maintain oxygen saturations above 88 percent independently.         Oxygen Discharge (Final Oxygen Re-Evaluation): Oxygen Re-Evaluation - 04/11/18 1520      Program Oxygen Prescription   Program Oxygen Prescription  None      Home Oxygen   Home Oxygen Device  None    Sleep Oxygen Prescription  None    Home Exercise Oxygen Prescription  None    Home at Rest Exercise Oxygen Prescription  None      Goals/Expected Outcomes   Short Term Goals  To learn and demonstrate proper use of respiratory medications;To learn and understand importance of maintaining oxygen saturations>88%;To learn and demonstrate proper pursed lip breathing techniques or other breathing techniques.;To learn and understand importance of monitoring SPO2 with pulse oximeter and demonstrate accurate use of the pulse oximeter.    Long  Term Goals  Verbalizes importance of monitoring SPO2 with pulse oximeter and return demonstration;Maintenance of O2 saturations>88%;Compliance with respiratory medication;Demonstrates proper use of MDI's;Exhibits proper breathing techniques, such as pursed lip breathing or other method taught during program session    Comments  Patient checks her oxygen once in awhile when she is at home. Talked about the importance of checking her oxygen at home and making sure she is 88 percent or above.    Goals/Expected Outcomes  Short: monitor oxygen at home with exertion. Long: maintain oxygen saturations above 88 percent independently.       Initial Exercise Prescription: Initial Exercise Prescription - 03/06/18 1200      Date of Initial Exercise RX and  Referring Provider   Date  03/06/18    Referring Provider  [name]      Treadmill   MPH  1    Grade  0    Minutes  14    METs  1.7      NuStep   Level  1    SPM  80    Minutes  15    METs  1.5      Biostep-RELP   Level  1    SPM  50    Minutes  15    METs  1.5      Prescription Details   Frequency (times per week)  2    Duration  Progress to 45 minutes of aerobic exercise without signs/symptoms of physical distress      Intensity   THRR 40-80% of Max Heartrate  94-121    Ratings of Perceived Exertion  11-13    Perceived Dyspnea  0-4      Resistance Training   Training Prescription  Yes    Weight  2 lb    Reps  10-15       Perform Capillary Blood Glucose checks as needed.  Exercise Prescription Changes: Exercise Prescription Changes    Row Name 03/20/18 1400 03/28/18 1200 04/02/18 1500 04/18/18 0800       Response to Exercise   Blood Pressure (Admit)  124/66  -  116/60  130/82    Blood Pressure (Exercise)  138/68  -  -  -    Blood Pressure (Exit)  120/82  -  110/72  118/64    Heart Rate (Admit)  69 bpm  -  75 bpm  70 bpm    Heart Rate (Exercise)  90 bpm  -  82 bpm  90 bpm    Heart Rate (Exit)  68 bpm  -  66 bpm  67 bpm    Oxygen Saturation (Admit)  97 %  -  64 %  98 %    Oxygen Saturation (Exercise)  97 %  -  100 %  97 %    Oxygen Saturation (Exit)  100 %  -  98 %  99 %    Rating of Perceived Exertion (Exercise)  15  -  15  13    Perceived Dyspnea (Exercise)  3  -  1  0    Symptoms  SOB and fatigue  -  SOB and fatigue  SOB and fatigue    Comments  second full day of exercise  -  -  -    Duration  Progress to 45 minutes of aerobic exercise without signs/symptoms of physical distress  -  Continue with 45 min of aerobic exercise without signs/symptoms of physical distress.  Continue with 45 min of aerobic exercise without signs/symptoms of physical distress.    Intensity  THRR unchanged  -  THRR unchanged  THRR unchanged      Progression   Progression   Continue to progress workloads to maintain intensity without signs/symptoms of physical distress.  -  Continue to progress workloads to maintain intensity without signs/symptoms of physical distress.  Continue to progress workloads to maintain intensity without signs/symptoms of physical distress.    Average METs  1.84  -  1.79  1.79      Resistance Training   Training Prescription  Yes  -  Yes  Yes    Weight  2 lbs  -  2 lbs  2 lbs    Reps  10-15  -  10-15  10-15      Interval Training   Interval Training  No  -  No  No      Treadmill   MPH  1  -  1  1    Grade  0  -  0  0    Minutes  15  -  15  15    METs  1.7  -  1.77  1.77      NuStep   Level  1  -  1  1    Minutes  15  -  15  15    METs  1.7  -  1.6  1.6      Biostep-RELP   Level  1  -  1  1    Minutes  15  -  15  15    METs  2  -  2  2      Home Exercise Plan   Plans to continue exercise at  -  Home (comment) walking  Home (comment) walking  Home (comment) walking    Frequency  -  Add 1 additional day to program exercise sessions.  Add 1 additional day to program exercise sessions.  Add 1 additional day to program exercise sessions.    Initial Home Exercises Provided  -  03/28/18  03/28/18  03/28/18       Exercise Comments: Exercise Comments    Row Name 03/14/18 1124           Exercise Comments  First full day of exercise!  Patient was oriented to gym and equipment including functions, settings, policies, and procedures.  Patient's individual exercise prescription and treatment plan were reviewed.  All starting workloads were established based on the results of the 6 minute walk test done at initial orientation visit.  The plan for exercise progression was also introduced and progression will be customized based on patient's performance and goals          Exercise Goals and Review: Exercise Goals    Row Name 03/06/18 1218             Exercise Goals   Increase Physical Activity  Yes       Intervention   Provide advice, education, support and counseling about physical activity/exercise needs.;Develop an individualized exercise prescription for aerobic and resistive training based on initial evaluation findings, risk stratification, comorbidities and participant's personal goals.       Expected Outcomes  Short Term: Attend rehab on a regular basis to increase amount of physical activity.;Long Term: Add in home exercise to make exercise part of routine and to increase amount of physical activity.;Long Term: Exercising regularly at least 3-5 days a week.       Increase Strength and Stamina  Yes       Intervention  Provide advice, education, support and counseling about physical activity/exercise needs.;Develop an individualized exercise prescription for aerobic and resistive training based on initial evaluation findings, risk stratification, comorbidities and participant's personal goals.       Expected Outcomes  Short Term: Increase workloads from initial exercise prescription for resistance, speed, and METs.;Short Term: Perform resistance training exercises routinely during rehab and add in resistance training at home;Long Term: Improve cardiorespiratory fitness, muscular endurance and strength as measured by increased METs and functional capacity (6MWT)       Able to understand and use rate of perceived exertion (RPE) scale  Yes       Intervention  Provide education and explanation on how to use RPE scale       Expected Outcomes  Short Term: Able to use RPE daily in rehab to express subjective intensity level;Long Term:  Able to use RPE to guide intensity level when exercising independently       Able to understand and use Dyspnea scale  Yes  Intervention  Provide education and explanation on how to use Dyspnea scale       Expected Outcomes  Short Term: Able to use Dyspnea scale daily in rehab to express subjective sense of shortness of breath during exertion;Long Term: Able to use Dyspnea scale to  guide intensity level when exercising independently       Knowledge and understanding of Target Heart Rate Range (THRR)  Yes       Intervention  Provide education and explanation of THRR including how the numbers were predicted and where they are located for reference       Expected Outcomes  Short Term: Able to state/look up THRR;Short Term: Able to use daily as guideline for intensity in rehab;Long Term: Able to use THRR to govern intensity when exercising independently       Able to check pulse independently  Yes       Intervention  Provide education and demonstration on how to check pulse in carotid and radial arteries.;Review the importance of being able to check your own pulse for safety during independent exercise       Expected Outcomes  Short Term: Able to explain why pulse checking is important during independent exercise;Long Term: Able to check pulse independently and accurately       Understanding of Exercise Prescription  Yes       Intervention  Provide education, explanation, and written materials on patient's individual exercise prescription       Expected Outcomes  Short Term: Able to explain program exercise prescription;Long Term: Able to explain home exercise prescription to exercise independently          Exercise Goals Re-Evaluation : Exercise Goals Re-Evaluation    Row Name 03/14/18 1126 03/20/18 1447 03/28/18 1212 04/02/18 1554 04/18/18 0821     Exercise Goal Re-Evaluation   Exercise Goals Review  Increase Physical Activity;Increase Strength and Stamina;Able to understand and use rate of perceived exertion (RPE) scale;Able to understand and use Dyspnea scale;Understanding of Exercise Prescription;Knowledge and understanding of Target Heart Rate Range (THRR)  Increase Physical Activity;Increase Strength and Stamina;Understanding of Exercise Prescription  Increase Physical Activity;Increase Strength and Stamina;Understanding of Exercise Prescription  Increase Physical  Activity;Increase Strength and Stamina;Understanding of Exercise Prescription  Increase Physical Activity;Increase Strength and Stamina;Understanding of Exercise Prescription   Comments  Reviewed RPE scale, THR and program prescription with pt today.  Pt voiced understanding and was given a copy of goals to take home.   Yeraldin is off to a good start in rehab.  She has completed two full days of exercise.  All of her workloads are between 13-15 on the RPE scale.  We will continue to monitor her progress.   Reviewed home exercise with pt today.  Pt plans to walk at home and stores for exercise.  She is going to start by adding in one extra day at home to get to three days a week.  Reviewed THR, pulse, RPE, sign and symptoms, and when to call 911 or MD.  Also discussed weather considerations and indoor options.  Pt voiced understanding.  Danine has been doing well in rehab.  She is still at baseline workloads, but we will try to incease the NuStep for her. We will continue to monitor her progress.   Tanee continues to do well in rehab.  She is maintain 1.6 METs on the NuStep.  We will try to increase her workloads again.  We will continue to monitor her progression.    Expected Outcomes  Short: Use RPE daily to regulate intensity. Long: Follow program prescription in THR.  Short: Continue to attend rehab regularly.  Long: Continue to follow program prescription.  Short: Add in at least one extra day a week next week.  Long: Start to exercise independently  Short: Increase workload on NuStep.  Long: Continue to increase phsycial activity levels.   Short: Increase workloads.   Long: Continue to increase strength and stamina.       Discharge Exercise Prescription (Final Exercise Prescription Changes): Exercise Prescription Changes - 04/18/18 0800      Response to Exercise   Blood Pressure (Admit)  130/82    Blood Pressure (Exit)  118/64    Heart Rate (Admit)  70 bpm    Heart Rate (Exercise)  90 bpm    Heart Rate  (Exit)  67 bpm    Oxygen Saturation (Admit)  98 %    Oxygen Saturation (Exercise)  97 %    Oxygen Saturation (Exit)  99 %    Rating of Perceived Exertion (Exercise)  13    Perceived Dyspnea (Exercise)  0    Symptoms  SOB and fatigue    Duration  Continue with 45 min of aerobic exercise without signs/symptoms of physical distress.    Intensity  THRR unchanged      Progression   Progression  Continue to progress workloads to maintain intensity without signs/symptoms of physical distress.    Average METs  1.79      Resistance Training   Training Prescription  Yes    Weight  2 lbs    Reps  10-15      Interval Training   Interval Training  No      Treadmill   MPH  1    Grade  0    Minutes  15    METs  1.77      NuStep   Level  1    Minutes  15    METs  1.6      Biostep-RELP   Level  1    Minutes  15    METs  2      Home Exercise Plan   Plans to continue exercise at  Home (comment)   walking   Frequency  Add 1 additional day to program exercise sessions.    Initial Home Exercises Provided  03/28/18       Nutrition:  Target Goals: Understanding of nutrition guidelines, daily intake of sodium <1563m, cholesterol <2026m calories 30% from fat and 7% or less from saturated fats, daily to have 5 or more servings of fruits and vegetables.  Biometrics: Pre Biometrics - 03/06/18 1218      Pre Biometrics   Height  5' (1.524 m)    Weight  142 lb 14.4 oz (64.8 kg)    Waist Circumference  35 inches    Hip Circumference  41.5 inches    Waist to Hip Ratio  0.84 %    BMI (Calculated)  27.91        Nutrition Therapy Plan and Nutrition Goals: Nutrition Therapy & Goals - 03/19/18 1214      Nutrition Therapy   Diet  TLC    Drug/Food Interactions  Purine/Gout    Protein (specify units)  5-6oz    Fiber  20 grams    Whole Grain Foods  2 servings   3 ideal   Saturated Fats  10 max. grams    Fruits and Vegetables  5 servings/day  8 ideal, eats small portions   Sodium   1500 grams      Personal Nutrition Goals   Nutrition Goal  On days when appetite is decreased, try to snack consistently between meals to help meet nutritional needs and maintain CBW    Personal Goal #2  Continue to monitor fluid and sodium intake r/t CKD as directed by your nephrologist    Comments  States her appetite varies day to day, but her eating pattern is typically a larger breakfast, small lunch and moderate dinner with snacks semi-regularly      Intervention Plan   Intervention  Prescribe, educate and counsel regarding individualized specific dietary modifications aiming towards targeted core components such as weight, hypertension, lipid management, diabetes, heart failure and other comorbidities.    Expected Outcomes  Short Term Goal: Understand basic principles of dietary content, such as calories, fat, sodium, cholesterol and nutrients.;Short Term Goal: A plan has been developed with personal nutrition goals set during dietitian appointment.;Long Term Goal: Adherence to prescribed nutrition plan.       Nutrition Assessments: Nutrition Assessments - 03/06/18 1138      MEDFICTS Scores   Pre Score  36       Nutrition Goals Re-Evaluation: Nutrition Goals Re-Evaluation    [place] Name 03/19/18 1227 04/11/18 1541           Goals   Nutrition Goal  On days when appetite is decreased, try to snack consistently between meals to help meet nutritional needs and maintain CBW  Patient wants to maintain her weight and make healthier choices.      Comment  Reports her appeitite to vary day-to-day but feels that she tries to eat a variety of foods  Sometimes her appetite is good and sometimes she is not so hungry. She tries not to over eat.      Expected Outcome  She will eat on a consistent schedule each day, prioritizing total calories and protein intake  Short: make healthy eating choices when hungry. Long: maintain weight independently following dieticians reccomendations.         Personal Goal #2 Re-Evaluation   Personal Goal #2  Continue to monitor fluid and sodium intake r/t CKD as directed by your nephrologist  -         Nutrition Goals Discharge (Final Nutrition Goals Re-Evaluation): Nutrition Goals Re-Evaluation - 04/11/18 1541      Goals   Nutrition Goal  Patient wants to maintain her weight and make healthier choices.    Comment  Sometimes her appetite is good and sometimes she is not so hungry. She tries not to over eat.    Expected Outcome  Short: make healthy eating choices when hungry. Long: maintain weight independently following dieticians reccomendations.       Psychosocial: Target Goals: Acknowledge presence or absence of significant depression and/or stress, maximize coping skills, provide positive support system. Participant is able to verbalize types and ability to use techniques and skills needed for reducing stress and depression.   Initial Review & Psychosocial Screening: Initial Psych Review & Screening - 03/06/18 1135      Initial Review   Current issues with  Current Sleep Concerns;Current Stress Concerns    Source of Stress Concerns  Chronic Illness    Comments  She gets short of breath walking.      Family Dynamics   Good Support System?  Yes    Comments  She can look to her daugher Tammy for support  Barriers   Psychosocial barriers to participate in program  The patient should benefit from training in stress management and relaxation.      Screening Interventions   Interventions  Encouraged to exercise;Provide feedback about the scores to participant;Program counselor consult;To provide support and resources with identified psychosocial needs    Expected Outcomes  Short Term goal: Utilizing psychosocial counselor, staff and physician to assist with identification of specific Stressors or current issues interfering with healing process. Setting desired goal for each stressor or current issue identified.;Long Term Goal:  Stressors or current issues are controlled or eliminated.;Short Term goal: Identification and review with participant of any Quality of Life or Depression concerns found by scoring the questionnaire.;Long Term goal: The participant improves quality of Life and PHQ9 Scores as seen by post scores and/or verbalization of changes       Quality of Life Scores:  Scores of 19 and below usually indicate a poorer quality of life in these areas.  A difference of  2-3 points is a clinically meaningful difference.  A difference of 2-3 points in the total score of the Quality of Life Index has been associated with significant improvement in overall quality of life, self-image, physical symptoms, and general health in studies assessing change in quality of life.  PHQ-9: Recent Review Flowsheet Data    Depression screen [facility] 2/9 03/08/2018 03/06/2018 09/28/2017 07/26/2016 01/29/2015   Decreased Interest 0 1 1 0 0   Down, Depressed, Hopeless _0 0 0   PHQ - 2 Score _1 0 0   Altered sleeping _2 - -   Tired, decreased energy _3 - -   Change in appetite _4 - -   Feeling bad or failure about yourself  0 0 0 - -   Trouble concentrating 0 0 0 - -   Moving slowly or fidgety/restless 0 0 0 - -   Suicidal thoughts 0 0 0 - -   PHQ-9 Score _5 - -   Difficult doing work/chores Not difficult at all Not difficult at all Somewhat difficult - -     Interpretation of Total Score  Total Score Depression Severity:  1-4 = Minimal depression, 5-9 = Mild depression, 10-14 = Moderate depression, 15-19 = Moderately severe depression, 20-27 = Severe depression   Psychosocial Evaluation and Intervention: Psychosocial Evaluation - 03/14/18 1230      Psychosocial Evaluation & Interventions   Interventions  Encouraged to exercise with the program and follow exercise prescription;Stress management education    Comments  Counselor met with Ms. Enid Derry today Romie Minus) for initial psychsocial evaluation.  She is an 88  (almost 24) year old who has COPD.  Although Cardelia lives alone she has a strong support system with a daughter; close friends and church family who are very involved in her life.  In addition to her pulmonary condition, Tineka mentioned a heart and kidney condition - but was unclear about details on those.  She reports sleeping "fair" with ~6 hours most nights of the week.  She is on medication that helps with this.  Charnetta reports her appetite has been up and down lately.  She states that anxiety symptoms have been a problem more recently and she was put on medication by her Dr. that "made me sick."  So it was discontinued about a month ago.  She is typically in a positive mood most of the time and reports minimal stress other than  her health and living alone - caring for the house, etc by herself.  Perl became tearful today as she mentioned the loss of one of her daughter approximately 18 months ago to Bone Cancer.  She relies on her faith and her support system to help her through this.  Zoella has goals to walk and breathe better while in this program.  Staff will follow with her.    Expected Outcomes  Short:  Josilyn will exercise consistently for her stamina and strength and to help with sleep and stress management   Long:  Tracia will develop a routine of exercise to have a better quality of life.      Continue Psychosocial Services   Follow up required by staff       Psychosocial Re-Evaluation: Psychosocial Re-Evaluation    [place] Name 04/11/18 1535             Psychosocial Re-Evaluation   Current issues with  Current Sleep Concerns;Current Stress Concerns       Comments  Patient has improved slightly with her shortness of breath. She went to the beach recently with her friends and had a great time eating crab. Her birthday is tomorrow and she will be 53. She is a happer person and wants to be in shape.       Expected Outcomes  Short: Attend [facility] stress management education to decrease stress. Long:  Maintain exercise Post [facility] to keep stress at a minimum.       Interventions  Encouraged to attend Pulmonary Rehabilitation for the exercise       Continue Psychosocial Services   Follow up required by staff          Psychosocial Discharge (Final Psychosocial Re-Evaluation): Psychosocial Re-Evaluation - 04/11/18 1535      Psychosocial Re-Evaluation   Current issues with  Current Sleep Concerns;Current Stress Concerns    Comments  Patient has improved slightly with her shortness of breath. She went to the beach recently with her friends and had a great time eating crab. Her birthday is tomorrow and she will be 50. She is a happer person and wants to be in shape.    Expected Outcomes  Short: Attend [facility] stress management education to decrease stress. Long: Maintain exercise Post [facility] to keep stress at a minimum.    Interventions  Encouraged to attend Pulmonary Rehabilitation for the exercise    Continue Psychosocial Services   Follow up required by staff       Education: Education Goals: Education classes will be provided on a weekly basis, covering required topics. Participant will state understanding/return demonstration of topics presented.  Learning Barriers/Preferences: Learning Barriers/Preferences - 03/06/18 1145      Learning Barriers/Preferences   Learning Barriers  Sight   wears glasses   Learning Preferences  None       Education Topics:  Initial Evaluation Education: - Verbal, written and demonstration of respiratory meds, oximetry and breathing techniques. Instruction on use of nebulizers and MDIs and importance of monitoring MDI activations.   Pulmonary Rehab from 04/18/2018 in [facility] Cardiac and Pulmonary Rehab  Date  03/06/18  Educator  [facility]  Instruction Review Code  1- Verbalizes Understanding      General Nutrition Guidelines/Fats and Fiber: -Group instruction provided by verbal, written material, models and posters to present the general  guidelines for heart healthy nutrition. Gives an explanation and review of dietary fats and fiber.   Pulmonary Rehab from 04/18/2018 in [facility] Cardiac and Pulmonary Rehab  Date  04/18/18  Educator  [facility]  Instruction Review Code  1- Verbalizes Understanding      Controlling Sodium/Reading Food Labels: -Group verbal and written material supporting the discussion of sodium use in heart healthy nutrition. Review and explanation with models, verbal and written materials for utilization of the food label.   Exercise Physiology & General Exercise Guidelines: - Group verbal and written instruction with models to review the exercise physiology of the cardiovascular system and associated critical values. Provides general exercise guidelines with specific guidelines to those with heart or lung disease.    Aerobic Exercise & Resistance Training: - Gives group verbal and written instruction on the various components of exercise. Focuses on aerobic and resistive training programs and the benefits of this training and how to safely progress through these programs.   Flexibility, Balance, Mind/Body Relaxation: Provides group verbal/written instruction on the benefits of flexibility and balance training, including mind/body exercise modes such as yoga, pilates and tai chi.  Demonstration and skill practice provided.   Stress and Anxiety: - Provides group verbal and written instruction about the health risks of elevated stress and causes of high stress.  Discuss the correlation between heart/lung disease and anxiety and treatment options. Review healthy ways to manage with stress and anxiety.   Pulmonary Rehab from 04/18/2018 in [facility] Cardiac and Pulmonary Rehab  Date  03/14/18  Educator  [facility]  Instruction Review Code  1- Verbalizes Understanding      Depression: - Provides group verbal and written instruction on the correlation between heart/lung disease and depressed mood, treatment options, and the stigmas  associated with seeking treatment.   Exercise & Equipment Safety: - Individual verbal instruction and demonstration of equipment use and safety with use of the equipment.   Pulmonary Rehab from 04/18/2018 in [facility] Cardiac and Pulmonary Rehab  Date  03/06/18  Educator  [facility]  Instruction Review Code  1- Verbalizes Understanding      Infection Prevention: - Provides verbal and written material to individual with discussion of infection control including proper hand washing and proper equipment cleaning during exercise session.   Pulmonary Rehab from 04/18/2018 in [facility] Cardiac and Pulmonary Rehab  Date  03/06/18  Educator  [facility]  Instruction Review Code  1- Verbalizes Understanding      Falls Prevention: - Provides verbal and written material to individual with discussion of falls prevention and safety.   Pulmonary Rehab from 04/18/2018 in [facility] Cardiac and Pulmonary Rehab  Date  03/06/18  Educator  [facility]  Instruction Review Code  1- Verbalizes Understanding      Diabetes: - Individual verbal and written instruction to review signs/symptoms of diabetes, desired ranges of glucose level fasting, after meals and with exercise. Advice that pre and post exercise glucose checks will be done for 3 sessions at entry of program.   Chronic Lung Diseases: - Group verbal and written instruction to review updates, respiratory medications, advancements in procedures and treatments. Discuss use of supplemental oxygen including available portable oxygen systems, continuous and intermittent flow rates, concentrators, personal use and safety guidelines. Review proper use of inhaler and spacers. Provide informative websites for self-education.    Energy Conservation: - Provide group verbal and written instruction for methods to conserve energy, plan and organize activities. Instruct on pacing techniques, use of adaptive equipment and posture/positioning to relieve shortness of breath.   Pulmonary Rehab from  04/18/2018 in [facility] Cardiac and Pulmonary Rehab  Date  03/28/18  Educator  [facility]  Instruction Review Code  1- Verbalizes Understanding      Triggers and Exacerbations: - Group verbal and written instruction to review types of environmental triggers and ways to prevent exacerbations. Discuss weather changes, air quality and the benefits of nasal washing. Review warning signs and symptoms to help prevent infections. Discuss techniques for effective airway clearance, coughing, and vibrations.   AED/CPR: - Group verbal and written instruction with the use of models to demonstrate the basic use of the AED with the basic ABC's of resuscitation.   Anatomy and Physiology of the Lungs: - Group verbal and written instruction with the use of models to provide basic lung anatomy and physiology related to function, structure and complications of lung disease.   Anatomy & Physiology of the Heart: - Group verbal and written instruction and models provide basic cardiac anatomy and physiology, with the coronary electrical and arterial systems. Review of Valvular disease and Heart Failure   Cardiac Medications: - Group verbal and written instruction to review commonly prescribed medications for heart disease. Reviews the medication, class of the drug, and side effects.   Pulmonary Rehab from 04/18/2018 in [facility] Cardiac and Pulmonary Rehab  Date  03/21/18  Educator  [facility]  Instruction Review Code  1- Verbalizes Understanding      Know Your Numbers and Risk Factors: -Group verbal and written instruction about important numbers in your health.  Discussion of what are risk factors and how they play a role in the disease process.  Review of Cholesterol, Blood Pressure, Diabetes, and BMI and the role they play in your overall health.   Sleep Hygiene: -Provides group verbal and written instruction about how sleep can affect your health.  Define sleep hygiene, discuss sleep cycles and impact of sleep habits. Review  good sleep hygiene tips.    Pulmonary Rehab from 04/18/2018 in [facility] Cardiac and Pulmonary Rehab  Date  04/11/18  Educator  [facility]  Instruction Review Code  1- Verbalizes Understanding      Other: -Provides group and verbal instruction on various topics (see comments)    Knowledge Questionnaire Score: Knowledge Questionnaire Score - 03/06/18 1140      Knowledge Questionnaire Score   Pre Score  11/18   reviewed with patient       Core Components/Risk Factors/Patient Goals at Admission: Personal Goals and Risk Factors at Admission - 03/06/18 1146      Core Components/Risk Factors/Patient Goals on Admission    Weight Management  Yes;Weight Maintenance;Weight Loss    Intervention  Weight Management: Develop a combined nutrition and exercise program designed to reach desired caloric intake, while maintaining appropriate intake of nutrient and fiber, sodium and fats, and appropriate energy expenditure required for the weight goal.;Weight Management: Provide education and appropriate resources to help participant work on and attain dietary goals.;Weight Management/Obesity: Establish reasonable short term and long term weight goals.    Admit Weight  142 lb 14.4 oz (64.8 kg)    Goal Weight: Short Term  135 lb (61.2 kg)    Goal Weight: Long Term  135 lb (61.2 kg)    Expected Outcomes  Short Term: Continue to assess and modify interventions until short term weight is achieved;Long Term: Adherence to nutrition and physical activity/exercise program aimed toward attainment of established weight goal;Weight Maintenance: Understanding of the daily nutrition guidelines, which includes 25-35% calories from fat, 7% or less cal from saturated fats, less than 241m cholesterol, less than 1.5gm of sodium, & 5 or more servings of fruits and vegetables daily;Understanding recommendations for meals  to include 15-35% energy as protein, 25-35% energy from fat, 35-60% energy from carbohydrates, less than 270m of  dietary cholesterol, 20-35 gm of total fiber daily;Understanding of distribution of calorie intake throughout the day with the consumption of 4-5 meals/snacks;Weight Loss: Understanding of general recommendations for a balanced deficit meal plan, which promotes 1-2 lb weight loss per week and includes a negative energy balance of [phone] kcal/d    Improve shortness of breath with ADL's  Yes    Intervention  Provide education, individualized exercise plan and daily activity instruction to help decrease symptoms of SOB with activities of daily living.    Expected Outcomes  Short Term: Improve cardiorespiratory fitness to achieve a reduction of symptoms when performing ADLs;Long Term: Be able to perform more ADLs without symptoms or delay the onset of symptoms    Hypertension  Yes    Intervention  Provide education on lifestyle modifcations including regular physical activity/exercise, weight management, moderate sodium restriction and increased consumption of fresh fruit, vegetables, and low fat dairy, alcohol moderation, and smoking cessation.;Monitor prescription use compliance.    Expected Outcomes  Short Term: Continued assessment and intervention until BP is < 140/934mHG in hypertensive participants. < 130/8012mG in hypertensive participants with diabetes, heart failure or chronic kidney disease.;Long Term: Maintenance of blood pressure at goal levels.       Core Components/Risk Factors/Patient Goals Review:  Goals and Risk Factor Review    [place] Name 04/11/18 1543             Core Components/Risk Factors/Patient Goals Review   Personal Goals Review  Weight Management/Obesity;Hypertension;Improve shortness of breath with ADL's       Review  Patient is working on losing a few pounds then maintaining her weight. Her blood pressure has been better since she started the program. Her shortness of breath has improved slightly. She has been walking [place] [employer] at [place]. She still gets short of  breath when she walks for a long time. Patient states she wants to be able to walk further without getting short of breath.       Expected Outcomes  Short: Attend [facility] regularly to improve shortness of breath with ADL's. Long: maintain independence with ADL's           Core Components/Risk Factors/Patient Goals at Discharge (Final Review):  Goals and Risk Factor Review - 04/11/18 1543      Core Components/Risk Factors/Patient Goals Review   Personal Goals Review  Weight Management/Obesity;Hypertension;Improve shortness of breath with ADL's    Review  Patient is working on losing a few pounds then maintaining her weight. Her blood pressure has been better since she started the program. Her shortness of breath has improved slightly. She has been walking at [employer] at [place]. She still gets short of breath when she walks for a long time. Patient states she wants to be able to walk further without getting short of breath.    Expected Outcomes  Short: Attend [facility] regularly to improve shortness of breath with ADL's. Long: maintain independence with ADL's        ITP Comments: ITP Comments    [place] Name 03/06/18 1110 04/02/18 0842 04/30/18 0847       ITP Comments  Medical Evaluation completed. Chart sent for review and changes to Dr. MarEmily Filbertrector of [facility] [place] be found in [facility] encounter 08/14/17  30 day review completed. ITP sent to Dr. MarEmily Filbertrector of [facility] with ITP unless changes  are made by physician  30 day review completed. ITP sent to Dr. Emily Filbert Director of [place]. Continue with ITP unless changes are made by physician        Comments: 30 day review

## 2018-04-30 NOTE — Progress Notes (Signed)
Daily Session Note  Patient Details  Name: Savannah Wilson MRN: [id number] Date of Birth: 1929/01/13 Referring Provider:     Pulmonary Rehab from 03/06/2018 in [facility] Cardiac and Pulmonary Rehab  Referring Provider  Derrel Nip      Encounter Date: 04/30/2018  Check In:      Social History   Tobacco Use  Smoking Status Never Smoker  Smokeless Tobacco Never Used    Goals Met:  Proper associated with RPD/PD & O2 Sat Independence with exercise equipment Exercise tolerated well Strength training completed today  Goals Unmet:  Not Applicable  Comments: Pt able to follow exercise prescription today without complaint.  Will continue to monitor for progression.    Dr. Emily Filbert is Medical Director for [place] and [facility].

## 2018-05-01 ENCOUNTER — Encounter: Payer: Self-pay | Admitting: Internal Medicine

## 2018-05-01 ENCOUNTER — Ambulatory Visit (INDEPENDENT_AMBULATORY_CARE_PROVIDER_SITE_OTHER): Payer: PPO | Admitting: Internal Medicine

## 2018-05-01 DIAGNOSIS — J069 Acute upper respiratory infection, unspecified: Secondary | ICD-10-CM | POA: Diagnosis not present

## 2018-05-01 DIAGNOSIS — F409 Phobic anxiety disorder, unspecified: Secondary | ICD-10-CM

## 2018-05-01 DIAGNOSIS — F5105 Insomnia due to other mental disorder: Secondary | ICD-10-CM | POA: Diagnosis not present

## 2018-05-01 DIAGNOSIS — F321 Major depressive disorder, single episode, moderate: Secondary | ICD-10-CM | POA: Diagnosis not present

## 2018-05-01 DIAGNOSIS — I10 Essential (primary) hypertension: Secondary | ICD-10-CM

## 2018-05-01 DIAGNOSIS — R0609 Other forms of dyspnea: Secondary | ICD-10-CM | POA: Diagnosis not present

## 2018-05-01 DIAGNOSIS — G2581 Restless legs syndrome: Secondary | ICD-10-CM

## 2018-05-01 MED ORDER — ROPINIROLE HCL 0.25 MG PO TABS: 0.2500 mg | ORAL_TABLET | Freq: Three times a day (TID) | ORAL | 0 refills | Status: DC

## 2018-05-01 MED ORDER — MUPIROCIN 2 % EX OINT: 1.0000 "application " | TOPICAL_OINTMENT | Freq: Two times a day (BID) | CUTANEOUS | 0 refills | Status: DC

## 2018-05-01 MED ORDER — LOSARTAN POTASSIUM 50 MG PO TABS: 50.0000 mg | ORAL_TABLET | Freq: Every day | ORAL | 0 refills | Status: DC

## 2018-05-01 MED ORDER — LOSARTAN POTASSIUM 25 MG PO TABS: 25.0000 mg | ORAL_TABLET | Freq: Every day | ORAL | 0 refills | Status: DC

## 2018-05-01 NOTE — Progress Notes (Signed)
Subjective:  Patient ID: Savannah Wilson, female    DOB: 1929/02/04  Age: 84 y.o. MRN: [id number]  CC: Diagnoses of Upper respiratory infection, viral, Essential hypertension, Dyspnea on exertion, Restless legs syndrome (RLS), Depression, major, single episode, moderate ([place]), and Insomnia due to anxiety and fear were pertinent to this visit.  HPI Savannah Wilson presents for follow  up on multiple chronic and acute issues. Including  GAD and depression  Cc:  c/o cough , congestion and rhinits with sinus pressure .  symptoms have been  PRESENT FOR ONE WEEK,  NO SICK CONTACTS.  STARTED AFTER SITTING OUTSIDE   Starting taking zyrtec which helped reduce the rhinitis.  Took tussionex last night for  Cough and suspended the restoril.  Denies facial pain  HTN:  Home readings 120/62 on 25 mg losartan   going into the doughnut hole. eliquis and   Budesonide are her most expensive medictions  [facility]oint [facility] 2 days per week   GAD/Depression:  Has not been taking Lexapro .  Still depressed,  Worries a lot,  Not sleeping well   Outpatient Medications Prior to Visit  Medication Sig Dispense Refill  . acetaminophen (TYLENOL) 650 MG CR tablet Take 650 mg by mouth every 8 (eight) hours as needed for pain.     Marland Kitchen ALPRAZolam (XANAX) 0.5 MG tablet Take 1 tablet (0.5 mg total) by mouth 2 (two) times daily as needed for anxiety. 45 tablet 5  . apixaban (ELIQUIS) 2.5 MG TABS tablet Take 1 tablet (2.5 mg total) by mouth 2 (two) times daily. 180 tablet 3  . benzonatate (TESSALON) 200 MG capsule Take 1 capsule (200 mg total) by mouth 3 (three) times daily as needed for cough. 60 capsule 1  . budesonide (PULMICORT) 0.25 MG/2ML nebulizer solution Take 2 mLs (0.25 mg total) by nebulization 2 (two) times daily. 60 mL 12  . cetirizine (ZYRTEC ALLERGY) 10 MG tablet Take 10 mg by mouth daily.    . Cholecalciferol (VITAMIN D-3) 1000 units CAPS Take 1,000 Units by mouth daily.     Marland Kitchen escitalopram (LEXAPRO) 10 MG tablet  Take 1 tablet (10 mg total) by mouth daily. After dinner 90 tablet 1  . fluticasone (FLONASE) 50 MCG/ACT nasal spray Place 2 sprays into the nose daily. (Patient taking differently: Place 2 sprays into the nose daily as needed (For allergies.). ) 16 g 6  . furosemide (LASIX) 20 MG tablet TAKE ONE TABLET BY MOUTH EVERY DAY 90 tablet 0  . furosemide (LASIX) 40 MG tablet Take 40 mg by mouth daily.    Marland Kitchen ipratropium-albuterol (DUONEB) 0.5-2.5 (3) MG/3ML SOLN Take 3 mLs by nebulization every 6 (six) hours as needed. 360 mL 0  . levothyroxine (SYNTHROID, LEVOTHROID) 100 MCG tablet Take 1 tablet (100 mcg total) by mouth daily. For thyroid 90 tablet 0  . omeprazole (PRILOSEC) 40 MG capsule TAKE ONE CAPSULE BY MOUTH TWICE A DAY. BEFORE BREAKFAST AND SUPPER 180 capsule 1  . PROAIR HFA 108 (90 BASE) MCG/ACT inhaler INHALE 2 PUFFS EVERY 6 HOURS AS NEEDED FOR WHEEZING 8.5 g 3  . Spacer/Aero-Holding Chambers (OPTICHAMBER ADVANTAGE) MISC 1 each by Other route once. Always uses her when you're using a metered-dose inhaler. You've aromatase medicine as much, he won't have his much side effect, but you it twice as much medicine and your lungs. 1 each 0  . temazepam (RESTORIL) 15 MG capsule TAKE 1 CAPSULE BY MOUTH AT BEDTIME AS NEEDED FOR SLEEP 30 capsule 2  .  vitamin B-12 (CYANOCOBALAMIN) 1000 MCG tablet Take 1,000 mcg by mouth daily.    . hydrALAZINE (APRESOLINE) 50 MG tablet Take 1 tablet (50 mg total) by mouth 2 (two) times daily. for hypertension 180 tablet 3  . losartan (COZAAR) 50 MG tablet Take 1 tablet (50 mg total) by mouth daily. 90 tablet 0   No facility-administered medications prior to visit.     Review of Systems;  Patient denies headache, fevers, malaise, unintentional weight loss, skin rash, eye pain,  sore throat, dysphagia,  hemoptysis ,, dyspnea, wheezing, chest pain, palpitations, orthopnea, edema, abdominal pain, nausea, melena, diarrhea, constipation, flank pain, dysuria, hematuria, urinary   Frequency, nocturia, numbness, tingling, seizures,  Focal weakness, Loss of consciousness,  Tremor, insomnia, depression, anxiety, and suicidal ideation.      Objective:  BP (!) 150/60 (BP Location: Left Arm, Patient Position: Sitting, Cuff Size: Normal)   Pulse 62   Temp 98.2 F (36.8 C) (Oral)   Resp 17   Ht 5' (1.524 m)   Wt 144 lb 3.2 oz (65.4 kg)   SpO2 97%   BMI 28.16 kg/m   BP Readings from Last 3 Encounters:  05/01/18 (!) 150/60  03/08/18 118/72  02/23/18 120/64    Wt Readings from Last 3 Encounters:  05/01/18 144 lb 3.2 oz (65.4 kg)  03/08/18 139 lb 12.8 oz (63.4 kg)  03/06/18 142 lb 14.4 oz (64.8 kg)    General appearance: alert, cooperative and appears stated age Ears: normal TM's and external ear canals both ears Throat: lips, mucosa, and tongue normal; teeth and gums normal Neck: no adenopathy, no carotid bruit, supple, symmetrical, trachea midline and thyroid not enlarged, symmetric, no tenderness/mass/nodules Back: symmetric, no curvature. ROM normal. No CVA tenderness. Lungs: clear to auscultation bilaterally Heart: regular rate and rhythm, S1, S2 normal, no murmur, click, rub or gallop Abdomen: soft, non-tender; bowel sounds normal; no masses,  no organomegaly Pulses: 2+ and symmetric Skin: Skin color, texture, turgor normal. No rashes or lesions Lymph nodes: Cervical, supraclavicular, and axillary nodes normal.  Lab Results  Component Value Date   HGBA1C 5.0 06/16/2016    Lab Results  Component Value Date   CREATININE 2.2 (A) 09/27/2017   CREATININE 2.16 (H) 09/21/2017   CREATININE 2.39 (H) 09/20/2017    Lab Results  Component Value Date   WBC 7.0 09/20/2017   HGB 9.2 (L) 09/20/2017   HCT 27.7 (L) 09/20/2017   PLT 222 09/20/2017   GLUCOSE 127 (H) 09/21/2017   CHOL 124 06/16/2016   TRIG 87.0 06/16/2016   HDL 42.50 06/16/2016   LDLCALC 64 06/16/2016   ALT 9 08/14/2017   AST 13 08/14/2017   NA 140 09/27/2017   K 3.9 09/27/2017   CL 110  09/21/2017   CREATININE 2.2 (A) 09/27/2017   BUN 69 (A) 09/27/2017   CO2 21 (L) 09/21/2017   TSH 2.310 01/26/2017   INR 1.48 04/18/2017   HGBA1C 5.0 06/16/2016    No results found.  Assessment & Plan:   Problem List Items Addressed This Visit    Depression, major, single episode, moderate ([place])     Advised to resume 1/2 dose of Lexapro dose once shd is  tolerating the lower dose       Dyspnea on exertion    Improving with regular participation in [place].      Hypertension    elevation noted today.  Patient has been asked to check bp at home and submit readings in 2 weeks.  Relevant Medications   losartan (COZAAR) 50 MG tablet   Insomnia due to anxiety and fear    Continue restoril 15 mg       Restless legs syndrome (RLS)    Trial of Requip discussed      Upper respiratory infection, viral    She is not febrile,  Hypoxic or  Wheezing,.  Lung exam is clear she has albuterol nebulized if needed.        Relevant Medications   mupirocin ointment (BACTROBAN) 2 %      I have discontinued Lilliah Priego. Marlin's losartan. I have also changed her losartan. Additionally, I am having her start on mupirocin ointment and rOPINIRole. Lastly, I am having her maintain her acetaminophen, fluticasone, PROAIR HFA, OPTICHAMBER ADVANTAGE, Vitamin D-3, vitamin B-12, benzonatate, ipratropium-albuterol, budesonide, furosemide, hydrALAZINE, escitalopram, temazepam, ALPRAZolam, apixaban, omeprazole, levothyroxine, furosemide, and cetirizine.  Meds ordered this encounter  Medications  . DISCONTD: losartan (COZAAR) 25 MG tablet    Sig: Take 1 tablet (25 mg total) by mouth daily.    Dispense:  90 tablet    Refill:  0  . losartan (COZAAR) 50 MG tablet    Sig: Take 1 tablet (50 mg total) by mouth daily.    Dispense:  90 tablet    Refill:  0    CORRECTED DOSE IS 50 MG DAILY . DISREGARD PREVIOUS E MAILED RX FOR 25  . mupirocin ointment (BACTROBAN) 2 %    Sig: Place 1 application into the  nose 2 (two) times daily.    Dispense:  22 g    Refill:  0  . rOPINIRole (REQUIP) 0.25 MG tablet    Sig: Take 1 tablet (0.25 mg total) by mouth 3 (three) times daily.    Dispense:  90 tablet    Refill:  0  A total of 40 minutes was spent with patient more than half of which was spent in counseling patient on the above mentioned issues , reviewing and explaining recent labs and imaging studies done, and coordination of care.   Medications Discontinued During This Encounter  Medication Reason  . losartan (COZAAR) 50 MG tablet   . losartan (COZAAR) 25 MG tablet     Follow-up: Return in about 3 months (around 07/31/2018).   [name], MD

## 2018-05-01 NOTE — Patient Instructions (Addendum)
1) For your cold:    Continue the cetirizine daily.  This is an allergy medication   You can add benadryl at night after you finish the cough medication, if you are still having a drippy nose.  You can Add Afrin nasal spray every 12 hours for 3 days to treat the stuffiness  If you are not better in 3 days,  Let me know     For the sore inside your nose:   Use the ointment 2 times daily for the inside of your nose    Your blood pressure is elevated today>  If your blood pressure starts to stay over 140, let me know     For your anxiety and insomnia  Restart the lexapro at 1/2 tablet daily with supper.   This is for your anxiety /mood,  Not for insomnia.  Do not increase the dose  until you have no GI symptoms from it   You can continue the restoril  (temazepam) but take it 30 minutes before bedtime to help you sleep      For your restless legs:  I am starting you on a new medication called  Requip.   (ropinirole) .Take it at BEDTIME.  A second dose can be taken earlier in the day if the feeling bothers you during the day or early evening

## 2018-05-02 ENCOUNTER — Encounter: Payer: PPO | Admitting: *Deleted

## 2018-05-02 DIAGNOSIS — R0609 Other forms of dyspnea: Principal | ICD-10-CM

## 2018-05-02 NOTE — Progress Notes (Signed)
Daily Session Note  Patient Details  Name: Savannah Wilson MRN: [id number] Date of Birth: 03-24-1929 Referring Provider:     Pulmonary Rehab from 03/06/2018 in [facility] Cardiac and Pulmonary Rehab  Referring Provider  Derrel Nip      Encounter Date: 05/02/2018  Check In: Session Check In - 05/02/18 1132      Check-In   Supervising physician immediately available to respond to emergencies  [facility] immediately available ER MD    Physician(s)  Dr. Jimmye Norman and Darl Householder    Location  [facility]-Cardiac & Pulmonary Rehab    Staff Present  Renita Papa, RN BSN;Jessica Luan Pulling, MA, RCEP, CCRP, Exercise Physiologist;Joseph Tessie Fass RCP,RRT,BSRT    Medication changes reported      No    Fall or balance concerns reported     No    Tobacco Cessation  No Change    Warm-up and Cool-down  Performed as group-led instruction    Resistance Training Performed  Yes    VAD Patient?  No    PAD/SET Patient?  No      Pain Assessment   Currently in Pain?  No/denies          Social History   Tobacco Use  Smoking Status Never Smoker  Smokeless Tobacco Never Used    Goals Met:  Proper associated with RPD/PD & O2 Sat Independence with exercise equipment Using PLB without cueing & demonstrates good technique Exercise tolerated well No report of cardiac concerns or symptoms Strength training completed today  Goals Unmet:  Not Applicable  Comments: Pt able to follow exercise prescription today without complaint.  Will continue to monitor for progression.    Dr. Emily Filbert is Medical Director for [place] and [facility].

## 2018-05-03 DIAGNOSIS — F5105 Insomnia due to other mental disorder: Secondary | ICD-10-CM

## 2018-05-03 DIAGNOSIS — F409 Phobic anxiety disorder, unspecified: Secondary | ICD-10-CM | POA: Insufficient documentation

## 2018-05-03 NOTE — Assessment & Plan Note (Signed)
elevation noted today.  Patient has been asked to check bp at home and submit readings in 2 weeks.  

## 2018-05-03 NOTE — Assessment & Plan Note (Signed)
Improving with regular participation in [place].

## 2018-05-03 NOTE — Assessment & Plan Note (Signed)
She is not febrile,  Hypoxic or  Wheezing,.  Lung exam is clear she has albuterol nebulized if needed.

## 2018-05-03 NOTE — Assessment & Plan Note (Addendum)
Advised to resume 1/2 dose of Lexapro dose once shd is  tolerating the lower dose

## 2018-05-03 NOTE — Assessment & Plan Note (Signed)
Trial of Requip discussed

## 2018-05-03 NOTE — Assessment & Plan Note (Signed)
Continue restoril 15 mg

## 2018-05-07 DIAGNOSIS — R0609 Other forms of dyspnea: Principal | ICD-10-CM

## 2018-05-07 NOTE — Progress Notes (Signed)
Daily Session Note  Patient Details  Name: Savannah Wilson MRN: [id number] Date of Birth: 05/29/1929 Referring Provider:     Pulmonary Rehab from 03/06/2018 in [facility] Cardiac and Pulmonary Rehab  Referring Provider  [name]      Encounter Date: 05/07/2018  Check In:      Social History   Tobacco Use  Smoking Status Never Smoker  Smokeless Tobacco Never Used    Goals Met:  Proper associated with RPD/PD & O2 Sat Independence with exercise equipment Exercise tolerated well Strength training completed today  Goals Unmet:  Not Applicable  Comments: Pt able to follow exercise prescription today without complaint.  Will continue to monitor for progression.    Dr. Mark Miller is Medical Director for [facility] and [facility]. 

## 2018-05-09 ENCOUNTER — Encounter: Payer: PPO | Attending: Internal Medicine | Admitting: *Deleted

## 2018-05-09 DIAGNOSIS — N184 Chronic kidney disease, stage 4 (severe): Secondary | ICD-10-CM | POA: Diagnosis not present

## 2018-05-09 DIAGNOSIS — K219 Gastro-esophageal reflux disease without esophagitis: Secondary | ICD-10-CM | POA: Insufficient documentation

## 2018-05-09 DIAGNOSIS — I129 Hypertensive chronic kidney disease with stage 1 through stage 4 chronic kidney disease, or unspecified chronic kidney disease: Secondary | ICD-10-CM | POA: Insufficient documentation

## 2018-05-09 DIAGNOSIS — Z79890 Hormone replacement therapy (postmenopausal): Secondary | ICD-10-CM | POA: Insufficient documentation

## 2018-05-09 DIAGNOSIS — R0609 Other forms of dyspnea: Secondary | ICD-10-CM | POA: Diagnosis not present

## 2018-05-09 DIAGNOSIS — Z79899 Other long term (current) drug therapy: Secondary | ICD-10-CM | POA: Diagnosis not present

## 2018-05-09 DIAGNOSIS — E785 Hyperlipidemia, unspecified: Secondary | ICD-10-CM | POA: Diagnosis not present

## 2018-05-09 NOTE — Progress Notes (Signed)
Daily Session Note  Patient Details  Name: Savannah Wilson MRN: [id number] Date of Birth: 01/25/1929 Referring Provider:     Pulmonary Rehab from 03/06/2018 in [facility] Cardiac and Pulmonary Rehab  Referring Provider  Derrel Nip      Encounter Date: 05/09/2018  Check In: Session Check In - 05/09/18 1139      Check-In   Supervising physician immediately available to respond to emergencies  [facility] immediately available ER MD    Physician(s)  Dr. Joni Fears and Jimmye Norman    Location  [facility]-Cardiac & Pulmonary Rehab    Staff Present  Renita Papa, RN BSN;Jessica Luan Pulling, MA, RCEP, CCRP, Exercise Physiologist;Joseph Tessie Fass RCP,RRT,BSRT    Medication changes reported      No    Fall or balance concerns reported     No    Tobacco Cessation  No Change    Warm-up and Cool-down  Performed as group-led instruction    Resistance Training Performed  Yes    VAD Patient?  No    PAD/SET Patient?  No      Pain Assessment   Currently in Pain?  No/denies          Social History   Tobacco Use  Smoking Status Never Smoker  Smokeless Tobacco Never Used    Goals Met:  Proper associated with RPD/PD & O2 Sat Independence with exercise equipment Using PLB without cueing & demonstrates good technique Exercise tolerated well No report of cardiac concerns or symptoms Strength training completed today  Goals Unmet:  Not Applicable  Comments: Pt able to follow exercise prescription today without complaint.  Will continue to monitor for progression.    Dr. Emily Filbert is Medical Director for [place] and [facility].

## 2018-05-14 DIAGNOSIS — R0609 Other forms of dyspnea: Secondary | ICD-10-CM | POA: Diagnosis not present

## 2018-05-14 NOTE — Progress Notes (Signed)
Daily Session Note  Patient Details  Name: Savannah Wilson MRN: [id number] Date of Birth: July 13, 1929 Referring Provider:     Pulmonary Rehab from 03/06/2018 in [facility] Cardiac and Pulmonary Rehab  Referring Provider  Derrel Nip      Encounter Date: 05/14/2018  Check In:      Social History   Tobacco Use  Smoking Status Never Smoker  Smokeless Tobacco Never Used    Goals Met:  Proper associated with RPD/PD & O2 Sat Independence with exercise equipment Exercise tolerated well Strength training completed today  Goals Unmet:  Not Applicable  Comments: Pt able to follow exercise prescription today without complaint.  Will continue to monitor for progression.    Dr. Emily Filbert is Medical Director for [place] and [facility].

## 2018-05-16 ENCOUNTER — Encounter: Payer: PPO | Admitting: *Deleted

## 2018-05-16 DIAGNOSIS — R0609 Other forms of dyspnea: Secondary | ICD-10-CM | POA: Diagnosis not present

## 2018-05-16 NOTE — Progress Notes (Signed)
Daily Session Note  Patient Details  Name: Savannah Wilson MRN: [id number] Date of Birth: 12/27/1928 Referring Provider:     Pulmonary Rehab from 03/06/2018 in [facility] Cardiac and Pulmonary Rehab  Referring Provider  Derrel Nip      Encounter Date: 05/16/2018  Check In: Session Check In - 05/16/18 1130      Check-In   Supervising physician immediately available to respond to emergencies  [facility] immediately available ER MD    Physician(s)  Dr. Darl Householder and Jimmye Norman    Location  [facility]-Cardiac & Pulmonary Rehab    Staff Present  Renita Papa, RN BSN;Jessica Luan Pulling, MA, RCEP, CCRP, Exercise Physiologist;Joseph Tessie Fass RCP,RRT,BSRT    Medication changes reported      No    Fall or balance concerns reported     No    Warm-up and Cool-down  Performed as group-led instruction    Resistance Training Performed  Yes    VAD Patient?  No    PAD/SET Patient?  No      Pain Assessment   Currently in Pain?  No/denies          Social History   Tobacco Use  Smoking Status Never Smoker  Smokeless Tobacco Never Used    Goals Met:  Proper associated with RPD/PD & O2 Sat Independence with exercise equipment Using PLB without cueing & demonstrates good technique Exercise tolerated well No report of cardiac concerns or symptoms Strength training completed today  Goals Unmet:  Not Applicable  Comments: Pt able to follow exercise prescription today without complaint.  Will continue to monitor for progression.    Dr. Emily Filbert is Medical Director for [place] and [facility].

## 2018-05-21 DIAGNOSIS — R0609 Other forms of dyspnea: Principal | ICD-10-CM

## 2018-05-21 NOTE — Progress Notes (Signed)
Daily Session Note  Patient Details  Name: Savannah Wilson MRN: [id number] Date of Birth: 03-Mar-1929 Referring Provider:     Pulmonary Rehab from 03/06/2018 in [facility] Cardiac and Pulmonary Rehab  Referring Provider  Derrel Nip      Encounter Date: 05/21/2018  Check In: Session Check In - 05/21/18 1553      Check-In   Supervising physician immediately available to respond to emergencies  [facility] immediately available ER MD    Physician(s)  Dr. Kerman Passey and [facility]    Location  [facility]-Cardiac & Pulmonary Rehab    Staff Present  Justin Mend RCP,RRT,BSRT;Amanda Oletta Darter, [place], ACSM CEP, Exercise Physiologist;Kelly Amedeo Plenty, BS, ACSM CEP, Exercise Physiologist    Medication changes reported      No    Fall or balance concerns reported     No    Warm-up and Cool-down  Performed as group-led instruction    Resistance Training Performed  Yes    VAD Patient?  No    PAD/SET Patient?  No      Pain Assessment   Currently in Pain?  No/denies          Social History   Tobacco Use  Smoking Status Never Smoker  Smokeless Tobacco Never Used    Goals Met:  Independence with exercise equipment Exercise tolerated well No report of cardiac concerns or symptoms Strength training completed today  Goals Unmet:  Not Applicable  Comments: Pt able to follow exercise prescription today without complaint.  Will continue to monitor for progression.    Dr. Emily Filbert is Medical Director for [place] and [facility].

## 2018-05-23 ENCOUNTER — Encounter: Payer: PPO | Admitting: *Deleted

## 2018-05-23 DIAGNOSIS — R0609 Other forms of dyspnea: Principal | ICD-10-CM

## 2018-05-23 NOTE — Progress Notes (Signed)
Daily Session Note  Patient Details  Name: Savannah Wilson MRN: [id number] Date of Birth: 1929/01/03 Referring Provider:     Pulmonary Rehab from 03/06/2018 in [facility] Cardiac and Pulmonary Rehab  Referring Provider  Derrel Nip      Encounter Date: 05/23/2018  Check In: Session Check In - 05/23/18 1124      Check-In   Supervising physician immediately available to respond to emergencies  [facility] immediately available ER MD    Physician(s)  Drs. Dennard Nip    Location  [facility]-Cardiac & Pulmonary Rehab    Staff Present  Renita Papa, RN BSN;Jessica Luan Pulling, [place], RCEP, CCRP, Exercise Physiologist;Joseph Tessie Fass RCP,RRT,BSRT    Medication changes reported      No    Fall or balance concerns reported     No    Warm-up and Cool-down  Performed as group-led instruction    Resistance Training Performed  Yes    VAD Patient?  No    PAD/SET Patient?  No      Pain Assessment   Currently in Pain?  No/denies          Social History   Tobacco Use  Smoking Status Never Smoker  Smokeless Tobacco Never Used    Goals Met:  Proper associated with RPD/PD & O2 Sat Independence with exercise equipment Using PLB without cueing & demonstrates good technique Exercise tolerated well No report of cardiac concerns or symptoms Strength training completed today  Goals Unmet:  Not Applicable  Comments:Pt able to follow exercise prescription today without complaint.  Will continue to monitor for progression.  Dr. Emily Filbert is Medical Director for [place] and [facility].

## 2018-05-28 DIAGNOSIS — R0609 Other forms of dyspnea: Principal | ICD-10-CM

## 2018-05-28 NOTE — Progress Notes (Signed)
Daily Session Note  Patient Details  Name: Savannah Wilson MRN: [id number] Date of Birth: 1928/08/24 Referring Provider:     Pulmonary Rehab from 03/06/2018 in [facility] Cardiac and Pulmonary Rehab  Referring Provider  Derrel Nip      Encounter Date: 05/28/2018  Check In:      Social History   Tobacco Use  Smoking Status Never Smoker  Smokeless Tobacco Never Used    Goals Met:  Proper associated with RPD/PD & O2 Sat Independence with exercise equipment Exercise tolerated well Strength training completed today  Goals Unmet:  Not Applicable  Comments: Pt able to follow exercise prescription today without complaint.  Will continue to monitor for progression.    Dr. Emily Filbert is Medical Director for [place] and [facility].

## 2018-05-28 NOTE — Progress Notes (Signed)
Pulmonary Individual Treatment Plan  Patient Details  Name: Savannah Wilson MRN: [id number] Date of Birth: April 23, 1929 Referring Provider:     Pulmonary Rehab from 03/06/2018 in [facility] Cardiac and Pulmonary Rehab  Referring Provider  [name]      Initial Encounter Date:    Pulmonary Rehab from 03/06/2018 in [facility] Cardiac and Pulmonary Rehab  Date  03/06/18      Visit Diagnosis: Dyspnea on exertion  Patient's Home Medications on Admission:  Current Outpatient Medications:  .  acetaminophen (TYLENOL) 650 MG CR tablet, Take 650 mg by mouth every 8 (eight) hours as needed for pain. , Disp: , Rfl:  .  ALPRAZolam (XANAX) 0.5 MG tablet, Take 1 tablet (0.5 mg total) by mouth 2 (two) times daily as needed for anxiety., Disp: 45 tablet, Rfl: 5 .  apixaban (ELIQUIS) 2.5 MG TABS tablet, Take 1 tablet (2.5 mg total) by mouth 2 (two) times daily., Disp: 180 tablet, Rfl: 3 .  benzonatate (TESSALON) 200 MG capsule, Take 1 capsule (200 mg total) by mouth 3 (three) times daily as needed for cough., Disp: 60 capsule, Rfl: 1 .  budesonide (PULMICORT) 0.25 MG/2ML nebulizer solution, Take 2 mLs (0.25 mg total) by nebulization 2 (two) times daily., Disp: 60 mL, Rfl: 12 .  cetirizine (ZYRTEC ALLERGY) 10 MG tablet, Take 10 mg by mouth daily., Disp: , Rfl:  .  Cholecalciferol (VITAMIN D-3) 1000 units CAPS, Take 1,000 Units by mouth daily. , Disp: , Rfl:  .  escitalopram (LEXAPRO) 10 MG tablet, Take 1 tablet (10 mg total) by mouth daily. After dinner, Disp: 90 tablet, Rfl: 1 .  fluticasone (FLONASE) 50 MCG/ACT nasal spray, Place 2 sprays into the nose daily. (Patient taking differently: Place 2 sprays into the nose daily as needed (For allergies.). ), Disp: 16 g, Rfl: 6 .  furosemide (LASIX) 20 MG tablet, TAKE ONE TABLET BY MOUTH EVERY DAY, Disp: 90 tablet, Rfl: 0 .  furosemide (LASIX) 40 MG tablet, Take 40 mg by mouth daily., Disp: , Rfl:  .  hydrALAZINE (APRESOLINE) 50 MG tablet, Take 1 tablet (50 mg total) by mouth 2  (two) times daily. for hypertension, Disp: 180 tablet, Rfl: 3 .  ipratropium-albuterol (DUONEB) 0.5-2.5 (3) MG/3ML SOLN, Take 3 mLs by nebulization every 6 (six) hours as needed., Disp: 360 mL, Rfl: 0 .  levothyroxine (SYNTHROID, LEVOTHROID) 100 MCG tablet, Take 1 tablet (100 mcg total) by mouth daily. For thyroid, Disp: 90 tablet, Rfl: 0 .  losartan (COZAAR) 50 MG tablet, Take 1 tablet (50 mg total) by mouth daily., Disp: 90 tablet, Rfl: 0 .  mupirocin ointment (BACTROBAN) 2 %, Place 1 application into the nose 2 (two) times daily., Disp: 22 g, Rfl: 0 .  omeprazole (PRILOSEC) 40 MG capsule, TAKE ONE CAPSULE BY MOUTH TWICE A DAY. BEFORE BREAKFAST AND SUPPER, Disp: 180 capsule, Rfl: 1 .  PROAIR HFA 108 (90 BASE) MCG/ACT inhaler, INHALE 2 PUFFS EVERY 6 HOURS AS NEEDED FOR WHEEZING, Disp: 8.5 g, Rfl: 3 .  rOPINIRole (REQUIP) 0.25 MG tablet, Take 1 tablet (0.25 mg total) by mouth 3 (three) times daily., Disp: 90 tablet, Rfl: 0 .  Spacer/Aero-Holding Chambers (OPTICHAMBER ADVANTAGE) MISC, 1 each by Other route once. Always uses her when you're using a metered-dose inhaler. You've aromatase medicine as much, he won't have his much side effect, but you it twice as much medicine and your lungs., Disp: 1 each, Rfl: 0 .  temazepam (RESTORIL) 15 MG capsule, TAKE 1 CAPSULE BY MOUTH AT  BEDTIME AS NEEDED FOR SLEEP, Disp: 30 capsule, Rfl: 2 .  vitamin B-12 (CYANOCOBALAMIN) 1000 MCG tablet, Take 1,000 mcg by mouth daily., Disp: , Rfl:   Past Medical History: Past Medical History:  Diagnosis Date  . Asthma   . Barrett esophagus   . Chronic bronchitis   . Chronic kidney disease (CKD), stage IV (severe) ([place])   . Depression   . Diverticulosis   . GERD (gastroesophageal reflux disease)   . Gout   . Hyperlipidemia   . Hypertension   . Hypothyroid   . Mitral regurgitation    a. TTE 02/2017: EF 55-60%, normal WM, calcified mitral annulus with mod regurg, moderately dilated LA, unable to estimate PASP  .  Pancreatitis due to biliary obstruction March 2010   s/p ERCP sphincterotomy, cholecystectomy  . Peripheral neuralgia   . Persistent atrial fibrillation ([place])    a. diagnosed 01/24/17; CHADS2VASc => 5 (HTN, age x 2, vascular disease with PAD and aortic plaque, female) giving her an estimated annual stroke risk of 6.7%; b. successful DCCV 03/28/2017; c. on Eliquis  . Venous insufficiency     Tobacco Use: Social History   Tobacco Use  Smoking Status Never Smoker  Smokeless Tobacco Never Used    Labs: Recent Review Flowsheet Data    Labs for ITP Cardiac and Pulmonary Rehab Latest Ref Rng & Units 10/28/2013 06/16/2016   Cholestrol 0 - 200 mg/dL 116 124   LDLCALC 0 - 99 mg/dL 58 64   HDL >39.00 mg/dL 26.30(L) 42.50   Trlycerides 0.0 - 149.0 mg/dL 159.0(H) 87.0   Hemoglobin A1c 4.6 - 6.5 % - 5.0       Pulmonary Assessment Scores: Pulmonary Assessment Scores    Row Name 03/06/18 1112         ADL UCSD   ADL Phase  Entry     SOB Score total  55     Rest  0     Walk  2     Stairs  4     Bath  1     Dress  1     Shop  4       CAT Score   CAT Score  17       mMRC Score   mMRC Score  2        Pulmonary Function Assessment: Pulmonary Function Assessment - 03/06/18 1140      Breath   Bilateral Breath Sounds  Clear    Shortness of Breath  Yes;Limiting activity       Exercise Target Goals: Exercise Program Goal: Individual exercise prescription set using results from initial 6 min walk test and THRR while considering  patient's activity barriers and safety.   Exercise Prescription Goal: Initial exercise prescription builds to 30-45 minutes a day of aerobic activity, 2-3 days per week.  Home exercise guidelines will be given to patient during program as part of exercise prescription that the participant will acknowledge.  Activity Barriers & Risk Stratification:   6 Minute Walk: 6 Minute Walk    Row Name 03/06/18 1219         6 Minute Walk   Distance  610 feet      Walk Time  5.6 minutes     # of Rest Breaks  1     MPH  1.23     METS  0.6     RPE  13     Perceived Dyspnea   3  VO2 Peak  2.1     Symptoms  No     Resting HR  67 bpm     Resting BP  138/68     Resting Oxygen Saturation   97 %     Exercise Oxygen Saturation  during 6 min walk  95 %     Max Ex. HR  92 bpm     Max Ex. BP  138/60     2 Minute Post BP  130/64       Interval HR   1 Minute HR  86     2 Minute HR  89     3 Minute HR  92     4 Minute HR  84     5 Minute HR  88     6 Minute HR  88     2 Minute Post HR  64     Interval Heart Rate?  Yes       Interval Oxygen   Interval Oxygen?  Yes     Baseline Oxygen Saturation %  97 %     1 Minute Oxygen Saturation %  95 %     1 Minute Liters of Oxygen  0 L     2 Minute Oxygen Saturation %  95 %     2 Minute Liters of Oxygen  0 L     3 Minute Oxygen Saturation %  96 %     3 Minute Liters of Oxygen  0 L     4 Minute Oxygen Saturation %  96 %     4 Minute Liters of Oxygen  0 L     5 Minute Oxygen Saturation %  95 %     5 Minute Liters of Oxygen  0 L     6 Minute Oxygen Saturation %  96 %     6 Minute Liters of Oxygen  0 L     2 Minute Post Oxygen Saturation %  97 %     2 Minute Post Liters of Oxygen  0 L       Oxygen Initial Assessment: Oxygen Initial Assessment - 03/06/18 1145      Home Oxygen   Home Oxygen Device  None    Sleep Oxygen Prescription  None    Home Exercise Oxygen Prescription  None    Home at Rest Exercise Oxygen Prescription  None      Initial 6 min Walk   Oxygen Used  None      Program Oxygen Prescription   Program Oxygen Prescription  None      Intervention   Short Term Goals  To learn and demonstrate proper use of respiratory medications;To learn and understand importance of maintaining oxygen saturations>88%;To learn and demonstrate proper pursed lip breathing techniques or other breathing techniques.;To learn and understand importance of monitoring SPO2 with pulse oximeter and  demonstrate accurate use of the pulse oximeter.    Long  Term Goals  Verbalizes importance of monitoring SPO2 with pulse oximeter and return demonstration;Maintenance of O2 saturations>88%;Compliance with respiratory medication;Demonstrates proper use of MDI's;Exhibits proper breathing techniques, such as pursed lip breathing or other method taught during program session       Oxygen Re-Evaluation: Oxygen Re-Evaluation    Row Name 03/14/18 1126 04/11/18 1520 05/21/18 1553         Program Oxygen Prescription   Program Oxygen Prescription  None  None  None       Home Oxygen  Home Oxygen Device  None  None  None     Sleep Oxygen Prescription  None  None  None     Home Exercise Oxygen Prescription  None  None  None     Home at Rest Exercise Oxygen Prescription  None  None  None       Goals/Expected Outcomes   Short Term Goals  To learn and demonstrate proper use of respiratory medications;To learn and understand importance of maintaining oxygen saturations>88%;To learn and demonstrate proper pursed lip breathing techniques or other breathing techniques.;To learn and understand importance of monitoring SPO2 with pulse oximeter and demonstrate accurate use of the pulse oximeter.  To learn and demonstrate proper use of respiratory medications;To learn and understand importance of maintaining oxygen saturations>88%;To learn and demonstrate proper pursed lip breathing techniques or other breathing techniques.;To learn and understand importance of monitoring SPO2 with pulse oximeter and demonstrate accurate use of the pulse oximeter.  To learn and demonstrate proper use of respiratory medications;To learn and understand importance of maintaining oxygen saturations>88%;To learn and demonstrate proper pursed lip breathing techniques or other breathing techniques.;To learn and understand importance of monitoring SPO2 with pulse oximeter and demonstrate accurate use of the pulse oximeter.     Long  Term  Goals  Verbalizes importance of monitoring SPO2 with pulse oximeter and return demonstration;Maintenance of O2 saturations>88%;Compliance with respiratory medication;Demonstrates proper use of MDI's;Exhibits proper breathing techniques, such as pursed lip breathing or other method taught during program session  Verbalizes importance of monitoring SPO2 with pulse oximeter and return demonstration;Maintenance of O2 saturations>88%;Compliance with respiratory medication;Demonstrates proper use of MDI's;Exhibits proper breathing techniques, such as pursed lip breathing or other method taught during program session  Verbalizes importance of monitoring SPO2 with pulse oximeter and return demonstration;Maintenance of O2 saturations>88%;Compliance with respiratory medication;Demonstrates proper use of MDI's;Exhibits proper breathing techniques, such as pursed lip breathing or other method taught during program session     Comments  Reviewed PLB technique with pt.  Talked about how it work and it's important to maintaining his exercise saturations.    Patient checks her oxygen once in awhile when she is at home. Talked about the importance of checking her oxygen at home and making sure she is 88 percent or above.  Patient has been monitoring her oxygen at home whith exertion. She states she is in the high 80's to low 90's with exertion. She is walking slightly faster on the treadmill and does not require any oxyen.  Savannah Wilson needs to work on PLB more when she is exercising. She state that she is bad at using PLB when she is short of breath.     Goals/Expected Outcomes  Short: Become more profiecient at using PLB.   Long: Become independent at using PLB.  Short: monitor oxygen at home with exertion. Long: maintain oxygen saturations above 88 percent independently.  Short: work on PLB while on the treadmill. Long: use PLB when short of breath independently.        Oxygen Discharge (Final Oxygen Re-Evaluation): Oxygen  Re-Evaluation - 05/21/18 1553      Program Oxygen Prescription   Program Oxygen Prescription  None      Home Oxygen   Home Oxygen Device  None    Sleep Oxygen Prescription  None    Home Exercise Oxygen Prescription  None    Home at Rest Exercise Oxygen Prescription  None      Goals/Expected Outcomes   Short Term Goals  To learn and demonstrate  proper use of respiratory medications;To learn and understand importance of maintaining oxygen saturations>88%;To learn and demonstrate proper pursed lip breathing techniques or other breathing techniques.;To learn and understand importance of monitoring SPO2 with pulse oximeter and demonstrate accurate use of the pulse oximeter.    Long  Term Goals  Verbalizes importance of monitoring SPO2 with pulse oximeter and return demonstration;Maintenance of O2 saturations>88%;Compliance with respiratory medication;Demonstrates proper use of MDI's;Exhibits proper breathing techniques, such as pursed lip breathing or other method taught during program session    Comments  Patient has been monitoring her oxygen at home whith exertion. She states she is in the high 80's to low 90's with exertion. She is walking slightly faster on the treadmill and does not require any oxyen.  Savannah Wilson needs to work on PLB more when she is exercising. She state that she is bad at using PLB when she is short of breath.    Goals/Expected Outcomes  Short: work on PLB while on the treadmill. Long: use PLB when short of breath independently.       Initial Exercise Prescription: Initial Exercise Prescription - 03/06/18 1200      Date of Initial Exercise RX and Referring Provider   Date  03/06/18    Referring Provider  [name]      Treadmill   MPH  1    Grade  0    Minutes  14    METs  1.7      NuStep   Level  1    SPM  80    Minutes  15    METs  1.5      Biostep-RELP   Level  1    SPM  50    Minutes  15    METs  1.5      Prescription Details   Frequency (times per week)  2     Duration  Progress to 45 minutes of aerobic exercise without signs/symptoms of physical distress      Intensity   THRR 40-80% of Max Heartrate  94-121    Ratings of Perceived Exertion  11-13    Perceived Dyspnea  0-4      Resistance Training   Training Prescription  Yes    Weight  2 lb    Reps  10-15       Perform Capillary Blood Glucose checks as needed.  Exercise Prescription Changes: Exercise Prescription Changes    Row Name 03/20/18 1400 03/28/18 1200 04/02/18 1500 04/18/18 0800 05/02/18 1500     Response to Exercise   Blood Pressure (Admit)  124/66  -  116/60  130/82  124/82   Blood Pressure (Exercise)  138/68  -  -  -  -   Blood Pressure (Exit)  120/82  -  110/72  118/64  128/62   Heart Rate (Admit)  69 bpm  -  75 bpm  70 bpm  59 bpm   Heart Rate (Exercise)  90 bpm  -  82 bpm  90 bpm  122 bpm   Heart Rate (Exit)  68 bpm  -  66 bpm  67 bpm  62 bpm   Oxygen Saturation (Admit)  97 %  -  64 %  98 %  97 %   Oxygen Saturation (Exercise)  97 %  -  100 %  97 %  96 %   Oxygen Saturation (Exit)  100 %  -  98 %  99 %  99 %   Rating of  Perceived Exertion (Exercise)  15  -  _0 Perceived Dyspnea (Exercise)  3  -  1  0  2   Symptoms  SOB and fatigue  -  SOB and fatigue  SOB and fatigue  SOB and fatigue   Comments  second full day of exercise  -  -  -  -   Duration  Progress to 45 minutes of aerobic exercise without signs/symptoms of physical distress  -  Continue with 45 min of aerobic exercise without signs/symptoms of physical distress.  Continue with 45 min of aerobic exercise without signs/symptoms of physical distress.  Continue with 45 min of aerobic exercise without signs/symptoms of physical distress.   Intensity  THRR unchanged  -  THRR unchanged  THRR unchanged  THRR unchanged     Progression   Progression  Continue to progress workloads to maintain intensity without signs/symptoms of physical distress.  -  Continue to progress workloads to maintain intensity  without signs/symptoms of physical distress.  Continue to progress workloads to maintain intensity without signs/symptoms of physical distress.  Continue to progress workloads to maintain intensity without signs/symptoms of physical distress.   Average METs  1.84  -  1.79  1.79  1.79     Resistance Training   Training Prescription  Yes  -  Yes  Yes  Yes   Weight  2 lbs  -  2 lbs  2 lbs  2 lbs   Reps  10-15  -  10-15  10-15  10-15     Interval Training   Interval Training  No  -  No  No  No     Treadmill   MPH  1  -  _1 Grade  0  -  0  0  0   Minutes  15  -  _2 METs  1.7  -  1.77  1.77  1.77     NuStep   Level  1  -  _3 Minutes  15  -  _4 METs  1.7  -  1.6  1.6  1.6     Biostep-RELP   Level  1  -  _5 Minutes  15  -  _6 METs  2  -  _7 Home Exercise Plan   Plans to continue exercise at  -  Home (comment) walking  Home (comment) walking  Home (comment) walking  Home (comment) walking   Frequency  -  Add 1 additional day to program exercise sessions.  Add 1 additional day to program exercise sessions.  Add 1 additional day to program exercise sessions.  Add 1 additional day to program exercise sessions.   Initial Home Exercises Provided  -  03/28/18  03/28/18  03/28/18  03/28/18   Row Name 05/15/18 0900             Response to Exercise   Blood Pressure (Admit)  104/60       Blood Pressure (Exit)  108/68       Heart Rate (Admit)  64 bpm       Heart Rate (Exercise)  71 bpm       Heart Rate (Exit)  84 bpm  Oxygen Saturation (Admit)  8 %       Oxygen Saturation (Exercise)  97 %       Oxygen Saturation (Exit)  98 %       Rating of Perceived Exertion (Exercise)  13       Perceived Dyspnea (Exercise)  1       Symptoms  SOB       Duration  Continue with 45 min of aerobic exercise without signs/symptoms of physical distress.       Intensity  THRR unchanged         Progression   Progression  Continue to progress  workloads to maintain intensity without signs/symptoms of physical distress.       Average METs  1.79         Resistance Training   Training Prescription  Yes       Weight  2 lbs       Reps  10-15         Interval Training   Interval Training  No         Treadmill   MPH  1       Grade  0       Minutes  15       METs  1.77         NuStep   Level  1       Minutes  15       METs  1.6         Biostep-RELP   Level  1       Minutes  15       METs  2         Home Exercise Plan   Plans to continue exercise at  Home (comment) walking       Frequency  Add 2 additional days to program exercise sessions.       Initial Home Exercises Provided  03/28/18          Exercise Comments: Exercise Comments    Row Name 03/14/18 1124           Exercise Comments  First full day of exercise!  Patient was oriented to gym and equipment including functions, settings, policies, and procedures.  Patient's individual exercise prescription and treatment plan were reviewed.  All starting workloads were established based on the results of the 6 minute walk test done at initial orientation visit.  The plan for exercise progression was also introduced and progression will be customized based on patient's performance and goals          Exercise Goals and Review: Exercise Goals    Row Name 03/06/18 1218             Exercise Goals   Increase Physical Activity  Yes       Intervention  Provide advice, education, support and counseling about physical activity/exercise needs.;Develop an individualized exercise prescription for aerobic and resistive training based on initial evaluation findings, risk stratification, comorbidities and participant's personal goals.       Expected Outcomes  Short Term: Attend rehab on a regular basis to increase amount of physical activity.;Long Term: Add in home exercise to make exercise part of routine and to increase amount of physical activity.;Long Term: Exercising  regularly at least 3-5 days a week.       Increase Strength and Stamina  Yes       Intervention  Provide advice, education, support and counseling about physical activity/exercise  needs.;Develop an individualized exercise prescription for aerobic and resistive training based on initial evaluation findings, risk stratification, comorbidities and participant's personal goals.       Expected Outcomes  Short Term: Increase workloads from initial exercise prescription for resistance, speed, and METs.;Short Term: Perform resistance training exercises routinely during rehab and add in resistance training at home;Long Term: Improve cardiorespiratory fitness, muscular endurance and strength as measured by increased METs and functional capacity (6MWT)       Able to understand and use rate of perceived exertion (RPE) scale  Yes       Intervention  Provide education and explanation on how to use RPE scale       Expected Outcomes  Short Term: Able to use RPE daily in rehab to express subjective intensity level;Long Term:  Able to use RPE to guide intensity level when exercising independently       Able to understand and use Dyspnea scale  Yes       Intervention  Provide education and explanation on how to use Dyspnea scale       Expected Outcomes  Short Term: Able to use Dyspnea scale daily in rehab to express subjective sense of shortness of breath during exertion;Long Term: Able to use Dyspnea scale to guide intensity level when exercising independently       Knowledge and understanding of Target Heart Rate Range (THRR)  Yes       Intervention  Provide education and explanation of THRR including how the numbers were predicted and where they are located for reference       Expected Outcomes  Short Term: Able to state/look up THRR;Short Term: Able to use daily as guideline for intensity in rehab;Long Term: Able to use THRR to govern intensity when exercising independently       Able to check pulse independently  Yes        Intervention  Provide education and demonstration on how to check pulse in carotid and radial arteries.;Review the importance of being able to check your own pulse for safety during independent exercise       Expected Outcomes  Short Term: Able to explain why pulse checking is important during independent exercise;Long Term: Able to check pulse independently and accurately       Understanding of Exercise Prescription  Yes       Intervention  Provide education, explanation, and written materials on patient's individual exercise prescription       Expected Outcomes  Short Term: Able to explain program exercise prescription;Long Term: Able to explain home exercise prescription to exercise independently          Exercise Goals Re-Evaluation : Exercise Goals Re-Evaluation    Row Name 03/14/18 1126 03/20/18 1447 03/28/18 1212 04/02/18 1554 04/18/18 0821     Exercise Goal Re-Evaluation   Exercise Goals Review  Increase Physical Activity;Increase Strength and Stamina;Able to understand and use rate of perceived exertion (RPE) scale;Able to understand and use Dyspnea scale;Understanding of Exercise Prescription;Knowledge and understanding of Target Heart Rate Range (THRR)  Increase Physical Activity;Increase Strength and Stamina;Understanding of Exercise Prescription  Increase Physical Activity;Increase Strength and Stamina;Understanding of Exercise Prescription  Increase Physical Activity;Increase Strength and Stamina;Understanding of Exercise Prescription  Increase Physical Activity;Increase Strength and Stamina;Understanding of Exercise Prescription   Comments  Reviewed RPE scale, THR and program prescription with pt today.  Pt voiced understanding and was given a copy of goals to take home.   Savannah Wilson is off to a good start in  rehab.  She has completed two full days of exercise.  All of her workloads are between 13-15 on the RPE scale.  We will continue to monitor her progress.   Reviewed home exercise  with pt today.  Pt plans to walk at home and stores for exercise.  She is going to start by adding in one extra day at home to get to three days a week.  Reviewed THR, pulse, RPE, sign and symptoms, and when to call 911 or MD.  Also discussed weather considerations and indoor options.  Pt voiced understanding.  Savannah Wilson has been doing well in rehab.  She is still at baseline workloads, but we will try to incease the NuStep for her. We will continue to monitor her progress.   Aleyna continues to do well in rehab.  She is maintain 1.6 METs on the NuStep.  We will try to increase her workloads again.  We will continue to monitor her progression.    Expected Outcomes  Short: Use RPE daily to regulate intensity. Long: Follow program prescription in THR.  Short: Continue to attend rehab regularly.  Long: Continue to follow program prescription.  Short: Add in at least one extra day a week next week.  Long: Start to exercise independently  Short: Increase workload on NuStep.  Long: Continue to increase phsycial activity levels.   Short: Increase workloads.   Long: Continue to increase strength and stamina.    [facility] Name 05/02/18 1524 05/15/18 0945           Exercise Goal Re-Evaluation   Exercise Goals Review  Increase Physical Activity;Increase Strength and Stamina;Understanding of Exercise Prescription  Increase Physical Activity;Increase Strength and Stamina;Understanding of Exercise Prescription      Comments  Savannah Wilson continues to do well in rehab. She is up to level 2 on the BioStep.  We will continue to monitor her progression.   Savannah Wilson has been doing well in rehab.   She is now on level 3 for the BioStep.  She continues to work hard in here for her.  We will continue to monitor her progress.       Expected Outcomes  Short: Increase workload on NuStep. Long: Continue to improve strength and stamina.   Short: Increase NuStep and treadmill.  Long: Continue to exercise at home on off days.          Discharge Exercise  Prescription (Final Exercise Prescription Changes): Exercise Prescription Changes - 05/15/18 0900      Response to Exercise   Blood Pressure (Admit)  104/60    Blood Pressure (Exit)  108/68    Heart Rate (Admit)  64 bpm    Heart Rate (Exercise)  71 bpm    Heart Rate (Exit)  84 bpm    Oxygen Saturation (Admit)  8 %    Oxygen Saturation (Exercise)  97 %    Oxygen Saturation (Exit)  98 %    Rating of Perceived Exertion (Exercise)  13    Perceived Dyspnea (Exercise)  1    Symptoms  SOB    Duration  Continue with 45 min of aerobic exercise without signs/symptoms of physical distress.    Intensity  THRR unchanged      Progression   Progression  Continue to progress workloads to maintain intensity without signs/symptoms of physical distress.    Average METs  1.79      Resistance Training   Training Prescription  Yes    Weight  2 lbs    Reps  10-15      Interval Training   Interval Training  No      Treadmill   MPH  1    Grade  0    Minutes  15    METs  1.77      NuStep   Level  1    Minutes  15    METs  1.6      Biostep-RELP   Level  1    Minutes  15    METs  2      Home Exercise Plan   Plans to continue exercise at  Home (comment)   walking   Frequency  Add 2 additional days to program exercise sessions.    Initial Home Exercises Provided  03/28/18       Nutrition:  Target Goals: Understanding of nutrition guidelines, daily intake of sodium <1569m, cholesterol <2051m calories 30% from fat and 7% or less from saturated fats, daily to have 5 or more servings of fruits and vegetables.  Biometrics: Pre Biometrics - 03/06/18 1218      Pre Biometrics   Height  5' (1.524 m)    Weight  142 lb 14.4 oz (64.8 kg)    Waist Circumference  35 inches    Hip Circumference  41.5 inches    Waist to Hip Ratio  0.84 %    BMI (Calculated)  27.91        Nutrition Therapy Plan and Nutrition Goals: Nutrition Therapy & Goals - 03/19/18 1214      Nutrition Therapy   Diet   TLC    Drug/Food Interactions  Purine/Gout    Protein (specify units)  5-6oz    Fiber  20 grams    Whole Grain Foods  2 servings   3 ideal   Saturated Fats  10 max. grams    Fruits and Vegetables  5 servings/day   8 ideal, eats small portions   Sodium  1500 grams      Personal Nutrition Goals   Nutrition Goal  On days when appetite is decreased, try to snack consistently between meals to help meet nutritional needs and maintain CBW    Personal Goal #2  Continue to monitor fluid and sodium intake r/t CKD as directed by your nephrologist    Comments  States her appetite varies day to day, but her eating pattern is typically a larger breakfast, small lunch and moderate dinner with snacks semi-regularly      Intervention Plan   Intervention  Prescribe, educate and counsel regarding individualized specific dietary modifications aiming towards targeted core components such as weight, hypertension, lipid management, diabetes, heart failure and other comorbidities.    Expected Outcomes  Short Term Goal: Understand basic principles of dietary content, such as calories, fat, sodium, cholesterol and nutrients.;Short Term Goal: A plan has been developed with personal nutrition goals set during dietitian appointment.;Long Term Goal: Adherence to prescribed nutrition plan.       Nutrition Assessments: Nutrition Assessments - 03/06/18 1138      MEDFICTS Scores   Pre Score  36       Nutrition Goals Re-Evaluation: Nutrition Goals Re-Evaluation    [place] 03/19/18 1227 04/11/18 1541 05/02/18 1226         Goals   Nutrition Goal  On days when appetite is decreased, try to snack consistently between meals to help meet nutritional needs and maintain CBW  Patient wants to maintain her weight and make healthier choices.  On days when  appetite is decreased, try to add in snacks in order to help meet daily nutritional needs; continue to monitor sodium as directed by nephrologist; maintain CBW      Comment  Reports her appeitite to vary day-to-day but feels that she tries to eat a variety of foods  Sometimes her appetite is good and sometimes she is not so hungry. She tries not to over eat.  She reports appetite to be better recently. She is snacking and eats sources of protein daily. Some common foods include nuts, fruit and chicken. She monitors sodium intake regularly     Expected Outcome  She will eat on a consistent schedule each day, prioritizing total calories and protein intake  Short: make healthy eating choices when hungry. Long: maintain weight independently following dieticians reccomendations.  Maintain CBW and practice a regular eating schedule to meet energy needs. Continue to monitor and moderate sodium intake       Personal Goal #2 Re-Evaluation   Personal Goal #2  Continue to monitor fluid and sodium intake r/t CKD as directed by your nephrologist  -  -        Nutrition Goals Discharge (Final Nutrition Goals Re-Evaluation): Nutrition Goals Re-Evaluation - 05/02/18 1226      Goals   Nutrition Goal  On days when appetite is decreased, try to add in snacks in order to help meet daily nutritional needs; continue to monitor sodium as directed by nephrologist; maintain CBW    Comment  She reports appetite to be better recently. She is snacking and eats sources of protein daily. Some common foods include nuts, fruit and chicken. She monitors sodium intake regularly    Expected Outcome  Maintain CBW and practice a regular eating schedule to meet energy needs. Continue to monitor and moderate sodium intake       Psychosocial: Target Goals: Acknowledge presence or absence of significant depression and/or stress, maximize coping skills, provide positive support system. Participant is able to verbalize types and ability to use techniques and skills needed for reducing stress and depression.   Initial Review & Psychosocial Screening: Initial Psych Review & Screening - 03/06/18 1135        Initial Review   Current issues with  Current Sleep Concerns;Current Stress Concerns    Source of Stress Concerns  Chronic Illness    Comments  She gets short of breath walking.      Family Dynamics   Good Support System?  Yes    Comments  She can look to her daugher Tammy for support      Barriers   Psychosocial barriers to participate in program  The patient should benefit from training in stress management and relaxation.      Screening Interventions   Interventions  Encouraged to exercise;Provide feedback about the scores to participant;Program counselor consult;To provide support and resources with identified psychosocial needs    Expected Outcomes  Short Term goal: Utilizing psychosocial counselor, staff and physician to assist with identification of specific Stressors or current issues interfering with healing process. Setting desired goal for each stressor or current issue identified.;Long Term Goal: Stressors or current issues are controlled or eliminated.;Short Term goal: Identification and review with participant of any Quality of Life or Depression concerns found by scoring the questionnaire.;Long Term goal: The participant improves quality of Life and PHQ9 Scores as seen by post scores and/or verbalization of changes       Quality of Life Scores:  Scores of 19 and below usually indicate  a poorer quality of life in these areas.  A difference of  2-3 points is a clinically meaningful difference.  A difference of 2-3 points in the total score of the Quality of Life Index has been associated with significant improvement in overall quality of life, self-image, physical symptoms, and general health in studies assessing change in quality of life.  PHQ-9: Recent Review Flowsheet Data    Depression screen [facility] 2/9 03/08/2018 03/06/2018 09/28/2017 07/26/2016 01/29/2015   Decreased Interest 0 1 1 0 0   Down, Depressed, Hopeless _0 0 0   PHQ - 2 Score _1 0 0   Altered sleeping _2 - -   Tired, decreased energy _3 - -   Change in appetite _4 - -   Feeling bad or failure about yourself  0 0 0 - -   Trouble concentrating 0 0 0 - -   Moving slowly or fidgety/restless 0 0 0 - -   Suicidal thoughts 0 0 0 - -   PHQ-9 Score _5 - -   Difficult doing work/chores Not difficult at all Not difficult at all Somewhat difficult - -     Interpretation of Total Score  Total Score Depression Severity:  1-4 = Minimal depression, 5-9 = Mild depression, 10-14 = Moderate depression, 15-19 = Moderately severe depression, 20-27 = Severe depression   Psychosocial Evaluation and Intervention: Psychosocial Evaluation - 03/14/18 1230      Psychosocial Evaluation & Interventions   Interventions  Encouraged to exercise with the program and follow exercise prescription;Stress management education    Comments  Counselor met with Savannah Wilson today Savannah Wilson) for initial psychsocial evaluation.  She is an 62 (almost 63) year old who has COPD.  Although Gwendolyn lives alone she has a strong support system with a daughter; close friends and church family who are very involved in her life.  In addition to her pulmonary condition, Crystelle mentioned a heart and kidney condition - but was unclear about details on those.  She reports sleeping "fair" with ~6 hours most nights of the week.  She is on medication that helps with this.  Akire reports her appetite has been up and down lately.  She states that anxiety symptoms have been a problem more recently and she was put on medication by her Dr. that "made me sick."  So it was discontinued about a month ago.  She is typically in a positive mood most of the time and reports minimal stress other than her health and living alone - caring for the house, etc by herself.  Cathren became tearful today as she mentioned the loss of one of her daughter approximately 18 months ago to Bone Cancer.  She relies on her faith and her support system to help her through this.  Trenton has  goals to walk and breathe better while in this program.  Staff will follow with her.    Expected Outcomes  Short:  Savannah Wilson will exercise consistently for her stamina and strength and to help with sleep and stress management   Long:  Savannah Wilson will develop a routine of exercise to have a better quality of life.      Continue Psychosocial Services   Follow up required by staff       Psychosocial Re-Evaluation: Psychosocial Re-Evaluation    [place] Name 04/11/18 1535 05/23/18 1225           Psychosocial Re-Evaluation  Current issues with  Current Sleep Concerns;Current Stress Concerns  Current Sleep Concerns;Current Stress Concerns      Comments  Patient has improved slightly with her shortness of breath. She went to the beach recently with her friends and had a great time eating crab. Her birthday is tomorrow and she will be 88. She is a happer person and wants to be in shape.  Savannah Wilson states that her sleeping is better due to an anti anxiety pill that she is on. She was going to bed and then having alot on her mind and could not fall asleep. Now she gets to sleep easier and is doing better. She feels better in general as well. She will sometimes think about her daughter that has passed away and that stresses her out. The holiday is hard for her when the weather gets cold.      Expected Outcomes  Short: Attend [facility] stress management education to decrease stress. Long: Maintain exercise [facility] to keep stress at a minimum.  short: consult with the mental health conselor on staff. Long: take psychosocial and stress managment classes.      Interventions  Encouraged to attend Pulmonary Rehabilitation for the exercise  Encouraged to attend Pulmonary Rehabilitation for the exercise      Continue Psychosocial Services   Follow up required by staff  Follow up required by staff         Psychosocial Discharge (Final Psychosocial Re-Evaluation): Psychosocial Re-Evaluation - 05/23/18 1225      Psychosocial  Re-Evaluation   Current issues with  Current Sleep Concerns;Current Stress Concerns    Comments  Savannah Wilson states that her sleeping is better due to an anti anxiety pill that she is on. She was going to bed and then having alot on her mind and could not fall asleep. Now she gets to sleep easier and is doing better. She feels better in general as well. She will sometimes think about her daughter that has passed away and that stresses her out. The holiday is hard for her when the weather gets cold.    Expected Outcomes  short: consult with the mental health conselor on staff. Long: take psychosocial and stress managment classes.    Interventions  Encouraged to attend Pulmonary Rehabilitation for the exercise    Continue Psychosocial Services   Follow up required by staff       Education: Education Goals: Education classes will be provided on a weekly basis, covering required topics. Participant will state understanding/return demonstration of topics presented.  Learning Barriers/Preferences: Learning Barriers/Preferences - 03/06/18 1145      Learning Barriers/Preferences   Learning Barriers  Sight   wears glasses   Learning Preferences  None       Education Topics:  Initial Evaluation Education: - Verbal, written and demonstration of respiratory meds, oximetry and breathing techniques. Instruction on use of nebulizers and MDIs and importance of monitoring MDI activations.   Pulmonary Rehab from 05/23/2018 in [facility] Cardiac and Pulmonary Rehab  Date  03/06/18  Educator  [facility]  Instruction Review Code  1- Verbalizes Understanding      General Nutrition Guidelines/Fats and Fiber: -Group instruction provided by verbal, written material, models and posters to present the general guidelines for heart healthy nutrition. Gives an explanation and review of dietary fats and fiber.   Pulmonary Rehab from 05/23/2018 in [facility] Cardiac and Pulmonary Rehab  Date  04/18/18  Educator  [facility]  Instruction Review  Code  1- Verbalizes Understanding  Controlling Sodium/Reading Food Labels: -Group verbal and written material supporting the discussion of sodium use in heart healthy nutrition. Review and explanation with models, verbal and written materials for utilization of the food label.   Exercise Physiology & General Exercise Guidelines: - Group verbal and written instruction with models to review the exercise physiology of the cardiovascular system and associated critical values. Provides general exercise guidelines with specific guidelines to those with heart or lung disease.    Pulmonary Rehab from 05/23/2018 in [facility] Cardiac and Pulmonary Rehab  Date  05/02/18  Educator  [facility]  Instruction Review Code  1- Verbalizes Understanding      Aerobic Exercise & Resistance Training: - Gives group verbal and written instruction on the various components of exercise. Focuses on aerobic and resistive training programs and the benefits of this training and how to safely progress through these programs.   Pulmonary Rehab from 05/23/2018 in [facility] Cardiac and Pulmonary Rehab  Date  05/16/18  Educator  [facility]  Instruction Review Code  1- Verbalizes Understanding      Flexibility, Balance, Mind/Body Relaxation: Provides group verbal/written instruction on the benefits of flexibility and balance training, including mind/body exercise modes such as yoga, pilates and tai chi.  Demonstration and skill practice provided.   Stress and Anxiety: - Provides group verbal and written instruction about the health risks of elevated stress and causes of high stress.  Discuss the correlation between heart/lung disease and anxiety and treatment options. Review healthy ways to manage with stress and anxiety.   Pulmonary Rehab from 05/23/2018 in [facility] Cardiac and Pulmonary Rehab  Date  03/14/18  Educator  [facility]  Instruction Review Code  1- Verbalizes Understanding      Depression: - Provides group verbal and written  instruction on the correlation between heart/lung disease and depressed mood, treatment options, and the stigmas associated with seeking treatment.   Pulmonary Rehab from 05/23/2018 in [facility] Cardiac and Pulmonary Rehab  Date  05/23/18  Educator  [facility]  Instruction Review Code  1- Verbalizes Understanding      Exercise & Equipment Safety: - Individual verbal instruction and demonstration of equipment use and safety with use of the equipment.   Pulmonary Rehab from 05/23/2018 in [facility] Cardiac and Pulmonary Rehab  Date  03/06/18  Educator  [facility]  Instruction Review Code  1- Verbalizes Understanding      Infection Prevention: - Provides verbal and written material to individual with discussion of infection control including proper hand washing and proper equipment cleaning during exercise session.   Pulmonary Rehab from 05/23/2018 in [facility] Cardiac and Pulmonary Rehab  Date  03/06/18  Educator  [facility]  Instruction Review Code  1- Verbalizes Understanding      Falls Prevention: - Provides verbal and written material to individual with discussion of falls prevention and safety.   Pulmonary Rehab from 05/23/2018 in [facility] Cardiac and Pulmonary Rehab  Date  03/06/18  Educator  [facility]  Instruction Review Code  1- Verbalizes Understanding      Diabetes: - Individual verbal and written instruction to review signs/symptoms of diabetes, desired ranges of glucose level fasting, after meals and with exercise. Advice that pre and post exercise glucose checks will be done for 3 sessions at entry of program.   Chronic Lung Diseases: - Group verbal and written instruction to review updates, respiratory medications, advancements in procedures and treatments. Discuss use of supplemental oxygen including available portable oxygen systems, continuous and intermittent flow rates, concentrators, personal use and safety guidelines. Review  proper use of inhaler and spacers. Provide informative websites for self-education.      Energy Conservation: - Provide group verbal and written instruction for methods to conserve energy, plan and organize activities. Instruct on pacing techniques, use of adaptive equipment and posture/positioning to relieve shortness of breath.   Pulmonary Rehab from 05/23/2018 in [facility] Cardiac and Pulmonary Rehab  Date  03/28/18  Educator  [facility]  Instruction Review Code  1- Verbalizes Understanding      Triggers and Exacerbations: - Group verbal and written instruction to review types of environmental triggers and ways to prevent exacerbations. Discuss weather changes, air quality and the benefits of nasal washing. Review warning signs and symptoms to help prevent infections. Discuss techniques for effective airway clearance, coughing, and vibrations.   Pulmonary Rehab from 05/23/2018 in [facility] Cardiac and Pulmonary Rehab  Date  05/09/18  Educator  [facility]  Instruction Review Code  1- Verbalizes Understanding      AED/CPR: - Group verbal and written instruction with the use of models to demonstrate the basic use of the AED with the basic ABC's of resuscitation.   Anatomy and Physiology of the Lungs: - Group verbal and written instruction with the use of models to provide basic lung anatomy and physiology related to function, structure and complications of lung disease.   Anatomy & Physiology of the Heart: - Group verbal and written instruction and models provide basic cardiac anatomy and physiology, with the coronary electrical and arterial systems. Review of Valvular disease and Heart Failure   Cardiac Medications: - Group verbal and written instruction to review commonly prescribed medications for heart disease. Reviews the medication, class of the drug, and side effects.   Pulmonary Rehab from 05/23/2018 in [facility] Cardiac and Pulmonary Rehab  Date  03/21/18  Educator  [facility]  Instruction Review Code  1- Verbalizes Understanding      Know Your Numbers and Risk Factors: -Group verbal and  written instruction about important numbers in your health.  Discussion of what are risk factors and how they play a role in the disease process.  Review of Cholesterol, Blood Pressure, Diabetes, and BMI and the role they play in your overall health.   Sleep Hygiene: -Provides group verbal and written instruction about how sleep can affect your health.  Define sleep hygiene, discuss sleep cycles and impact of sleep habits. Review good sleep hygiene tips.    Pulmonary Rehab from 05/23/2018 in [facility] Cardiac and Pulmonary Rehab  Date  04/11/18  Educator  [facility]  Instruction Review Code  1- Verbalizes Understanding      Other: -Provides group and verbal instruction on various topics (see comments)    Knowledge Questionnaire Score: Knowledge Questionnaire Score - 03/06/18 1140      Knowledge Questionnaire Score   Pre Score  11/18   reviewed with patient       Core Components/Risk Factors/Patient Goals at Admission: Personal Goals and Risk Factors at Admission - 03/06/18 1146      Core Components/Risk Factors/Patient Goals on Admission    Weight Management  Yes;Weight Maintenance;Weight Loss    Intervention  Weight Management: Develop a combined nutrition and exercise program designed to reach desired caloric intake, while maintaining appropriate intake of nutrient and fiber, sodium and fats, and appropriate energy expenditure required for the weight goal.;Weight Management: Provide education and appropriate resources to help participant work on and attain dietary goals.;Weight Management/Obesity: Establish reasonable short term and long term weight goals.    Admit Weight  142 lb  14.4 oz (64.8 kg)    Goal Weight: Short Term  135 lb (61.2 kg)    Goal Weight: Long Term  135 lb (61.2 kg)    Expected Outcomes  Short Term: Continue to assess and modify interventions until short term weight is achieved;Long Term: Adherence to nutrition and physical activity/exercise program aimed toward attainment  of established weight goal;Weight Maintenance: Understanding of the daily nutrition guidelines, which includes 25-35% calories from fat, 7% or less cal from saturated fats, less than 271m cholesterol, less than 1.5gm of sodium, & 5 or more servings of fruits and vegetables daily;Understanding recommendations for meals to include 15-35% energy as protein, 25-35% energy from fat, 35-60% energy from carbohydrates, less than 2073mof dietary cholesterol, 20-35 gm of total fiber daily;Understanding of distribution of calorie intake throughout the day with the consumption of 4-5 meals/snacks;Weight Loss: Understanding of general recommendations for a balanced deficit meal plan, which promotes 1-2 lb weight loss per week and includes a negative energy balance of [phone] kcal/d    Improve shortness of breath with ADL's  Yes    Intervention  Provide education, individualized exercise plan and daily activity instruction to help decrease symptoms of SOB with activities of daily living.    Expected Outcomes  Short Term: Improve cardiorespiratory fitness to achieve a reduction of symptoms when performing ADLs;Long Term: Be able to perform more ADLs without symptoms or delay the onset of symptoms    Hypertension  Yes    Intervention  Provide education on lifestyle modifcations including regular physical activity/exercise, weight management, moderate sodium restriction and increased consumption of fresh fruit, vegetables, and low fat dairy, alcohol moderation, and smoking cessation.;Monitor prescription use compliance.    Expected Outcomes  Short Term: Continued assessment and intervention until BP is < 140/9063mG in hypertensive participants. < 130/53m2m in hypertensive participants with diabetes, heart failure or chronic kidney disease.;Long Term: Maintenance of blood pressure at goal levels.       Core Components/Risk Factors/Patient Goals Review:  Goals and Risk Factor Review    Row Name 04/11/18 1543 05/23/18  1229           Core Components/Risk Factors/Patient Goals Review   Personal Goals Review  Weight Management/Obesity;Hypertension;Improve shortness of breath with ADL's  Weight Management/Obesity;Hypertension;Improve shortness of breath with ADL's      Review  Patient is working on losing a few pounds then maintaining her weight. Her blood pressure has been better since she started the program. Her shortness of breath has improved slightly. She has been walking at [employer] at home. She still gets short of breath when she walks for a long time. Patient states she wants to be able to walk further without getting short of breath.  -      Expected Outcomes  Short: Attend [facility] regularly to improve shortness of breath with ADL's. Long: maintain independence with ADL's   -         Core Components/Risk Factors/Patient Goals at Discharge (Final Review):  Goals and Risk Factor Review - 05/23/18 1229      Core Components/Risk Factors/Patient Goals Review   Personal Goals Review  Weight Management/Obesity;Hypertension;Improve shortness of breath with ADL's       ITP Comments: ITP Comments    Row Name 03/06/18 1110 04/02/18 0842 04/30/18 0847 05/28/18 0905     ITP Comments  Medical Evaluation completed. Chart sent for review and changes to Dr. MarkEmily Filbertector of [facility]  30 day review completed. ITP sent to Dr. Emily Filbert Director of [place]. Continue with ITP unless changes are made by physician  30 day review completed. ITP sent to Dr. Emily Filbert Director of [place]. Continue with ITP unless changes are made by physician  30 day review completed. ITP sent to Dr. Emily Filbert Director of [place]. Continue with ITP unless changes are made by physician.       Comments: 30 day review

## 2018-05-30 DIAGNOSIS — R0609 Other forms of dyspnea: Secondary | ICD-10-CM | POA: Diagnosis not present

## 2018-05-30 NOTE — Progress Notes (Signed)
Daily Session Note  Patient Details  Name: Savannah Wilson MRN: [id number] Date of Birth: 1929/03/27 Referring Provider:     Pulmonary Rehab from 03/06/2018 in [facility] Cardiac and Pulmonary Rehab  Referring Provider  Derrel Nip      Encounter Date: 05/30/2018  Check In: Session Check In - 05/30/18 1126      Check-In   Supervising physician immediately available to respond to emergencies  [facility] immediately available ER MD    Physician(s)  Dr. Joni Fears and Darl Householder    Location  [facility]-Cardiac & Pulmonary Rehab    Staff Present  Alberteen Sam, MA, RCEP, CCRP, Exercise Physiologist;Tadhg Eskew Alcus Dad, RN BSN    Medication changes reported      No    Fall or balance concerns reported     No    Warm-up and Cool-down  Performed as group-led Higher education careers adviser Performed  Yes    VAD Patient?  No    PAD/SET Patient?  No      Pain Assessment   Currently in Pain?  No/denies          Social History   Tobacco Use  Smoking Status Never Smoker  Smokeless Tobacco Never Used    Goals Met:  Independence with exercise equipment Exercise tolerated well No report of cardiac concerns or symptoms Strength training completed today  Goals Unmet:  Not Applicable  Comments: Pt able to follow exercise prescription today without complaint.  Will continue to monitor for progression.   Dr. Emily Filbert is Medical Director for [place] and [facility].

## 2018-05-31 DIAGNOSIS — R809 Proteinuria, unspecified: Secondary | ICD-10-CM | POA: Diagnosis not present

## 2018-05-31 DIAGNOSIS — N184 Chronic kidney disease, stage 4 (severe): Secondary | ICD-10-CM | POA: Diagnosis not present

## 2018-05-31 DIAGNOSIS — D631 Anemia in chronic kidney disease: Secondary | ICD-10-CM | POA: Diagnosis not present

## 2018-05-31 DIAGNOSIS — I129 Hypertensive chronic kidney disease with stage 1 through stage 4 chronic kidney disease, or unspecified chronic kidney disease: Secondary | ICD-10-CM | POA: Diagnosis not present

## 2018-06-04 DIAGNOSIS — R0609 Other forms of dyspnea: Secondary | ICD-10-CM | POA: Diagnosis not present

## 2018-06-04 NOTE — Progress Notes (Signed)
Daily Session Note  Patient Details  Name: Savannah Wilson MRN: [id number] Date of Birth: 1929-01-06 Referring Provider:     Pulmonary Rehab from 03/06/2018 in [facility] Cardiac and Pulmonary Rehab  Referring Provider  Derrel Nip      Encounter Date: 06/04/2018  Check In:      Social History   Tobacco Use  Smoking Status Never Smoker  Smokeless Tobacco Never Used    Goals Met:  Independence with exercise equipment Exercise tolerated well No report of cardiac concerns or symptoms Strength training completed today  Goals Unmet:  Not Applicable  Comments: Pt able to follow exercise prescription today without complaint.  Will continue to monitor for progression.    Dr. Emily Filbert is Medical Director for [place] and [facility].

## 2018-06-06 DIAGNOSIS — R0609 Other forms of dyspnea: Secondary | ICD-10-CM | POA: Diagnosis not present

## 2018-06-06 NOTE — Progress Notes (Signed)
Daily Session Note  Patient Details  Name: Savannah Wilson MRN: [id number] Date of Birth: 1929-01-04 Referring Provider:     Pulmonary Rehab from 03/06/2018 in [facility] Cardiac and Pulmonary Rehab  Referring Provider  Derrel Nip      Encounter Date: 06/06/2018  Check In: Session Check In - 06/06/18 1138      Check-In   Supervising physician immediately available to respond to emergencies  [facility] immediately available ER MD    Physician(s)  Dr. Reita Cliche and Quentin Cornwall    Location  [facility]-Cardiac & Pulmonary Rehab    Staff Present  Renita Papa, RN BSN;Jessica Luan Pulling, MA, RCEP, CCRP, Exercise Physiologist;Johnmatthew Solorio Tessie Fass RCP,RRT,BSRT    Medication changes reported      No    Fall or balance concerns reported     No    Warm-up and Cool-down  Performed as group-led instruction    Resistance Training Performed  Yes    VAD Patient?  No    PAD/SET Patient?  No      Pain Assessment   Currently in Pain?  No/denies          Social History   Tobacco Use  Smoking Status Never Smoker  Smokeless Tobacco Never Used    Goals Met:  Independence with exercise equipment Exercise tolerated well No report of cardiac concerns or symptoms Strength training completed today  Goals Unmet:  Not Applicable  Comments: Pt able to follow exercise prescription today without complaint.  Will continue to monitor for progression.    Dr. Emily Filbert is Medical Director for [place] and [facility].

## 2018-06-11 ENCOUNTER — Encounter: Payer: PPO | Attending: Internal Medicine

## 2018-06-11 DIAGNOSIS — Z79899 Other long term (current) drug therapy: Secondary | ICD-10-CM | POA: Insufficient documentation

## 2018-06-11 DIAGNOSIS — K219 Gastro-esophageal reflux disease without esophagitis: Secondary | ICD-10-CM | POA: Insufficient documentation

## 2018-06-11 DIAGNOSIS — I129 Hypertensive chronic kidney disease with stage 1 through stage 4 chronic kidney disease, or unspecified chronic kidney disease: Secondary | ICD-10-CM | POA: Diagnosis not present

## 2018-06-11 DIAGNOSIS — Z79890 Hormone replacement therapy (postmenopausal): Secondary | ICD-10-CM | POA: Diagnosis not present

## 2018-06-11 DIAGNOSIS — N184 Chronic kidney disease, stage 4 (severe): Secondary | ICD-10-CM | POA: Diagnosis not present

## 2018-06-11 DIAGNOSIS — R0609 Other forms of dyspnea: Secondary | ICD-10-CM | POA: Diagnosis not present

## 2018-06-11 DIAGNOSIS — E785 Hyperlipidemia, unspecified: Secondary | ICD-10-CM | POA: Diagnosis not present

## 2018-06-11 NOTE — Progress Notes (Signed)
Daily Session Note  Patient Details  Name: Savannah Wilson MRN: [id number] Date of Birth: [birth year] Referring Provider:     Pulmonary Rehab from 03/06/2018 in [facility] Cardiac and Pulmonary Rehab  Referring Provider  Derrel Nip      Encounter Date: 06/11/2018  Check In: Session Check In - 06/11/18 1348      Check-In   Supervising physician immediately available to respond to emergencies  [facility] immediately available ER MD    Physician(s)  Dr. Corky Downs and [facility]    Location  [facility]-Cardiac & Pulmonary Rehab    Staff Present  Justin Mend RCP,RRT,BSRT;Kelly Amedeo Plenty, BS, ACSM CEP, Exercise Physiologist    Medication changes reported      No    Fall or balance concerns reported     No    Warm-up and Cool-down  Performed as group-led instruction    Resistance Training Performed  Yes    VAD Patient?  No    PAD/SET Patient?  No      Pain Assessment   Currently in Pain?  No/denies          Social History   Tobacco Use  Smoking Status Never Smoker  Smokeless Tobacco Never Used    Goals Met:  Independence with exercise equipment Exercise tolerated well No report of cardiac concerns or symptoms Strength training completed today  Goals Unmet:  Not Applicable  Comments: Pt able to follow exercise prescription today without complaint.  Will continue to monitor for progression.    Dr. Emily Filbert is Medical Director for [place] and [facility].

## 2018-06-12 ENCOUNTER — Other Ambulatory Visit: Payer: Self-pay | Admitting: Internal Medicine

## 2018-06-13 ENCOUNTER — Encounter: Payer: PPO | Admitting: *Deleted

## 2018-06-13 DIAGNOSIS — R0609 Other forms of dyspnea: Secondary | ICD-10-CM | POA: Diagnosis not present

## 2018-06-13 NOTE — Telephone Encounter (Signed)
Last OV: 05/01/2018 Next OV: 08/07/2018  Levothyroxine    Refilled: 03/14/2018    Last TSH: 01/26/2017  Temazepam    Refilled: 02/07/2018

## 2018-06-13 NOTE — Progress Notes (Signed)
Daily Session Note  Patient Details  Name: Savannah Wilson MRN: [id number] Date of Birth: 09-07-1928 Referring Provider:     Pulmonary Rehab from 03/06/2018 in [facility] Cardiac and Pulmonary Rehab  Referring Provider  Derrel Nip      Encounter Date: 06/13/2018  Check In: Session Check In - 06/13/18 1122      Check-In   Supervising physician immediately available to respond to emergencies  [facility] immediately available ER MD    Physician(s)  Drs. Sharlene Dory    Location  [facility]-Cardiac & Pulmonary Rehab    Staff Present  Alberteen Sam, MA, RCEP, CCRP, Exercise Physiologist;Meredith Sherryll Burger, RN BSN;Joseph Tessie Fass RCP,RRT,BSRT    Medication changes reported      No    Fall or balance concerns reported     No    Warm-up and Cool-down  Performed as group-led instruction    Resistance Training Performed  Yes    VAD Patient?  No    PAD/SET Patient?  No      Pain Assessment   Currently in Pain?  No/denies          Social History   Tobacco Use  Smoking Status Never Smoker  Smokeless Tobacco Never Used    Goals Met:  Proper associated with RPD/PD & O2 Sat Independence with exercise equipment Using PLB without cueing & demonstrates good technique Exercise tolerated well No report of cardiac concerns or symptoms Strength training completed today  Goals Unmet:  Not Applicable  Comments: Pt able to follow exercise prescription today without complaint.  Will continue to monitor for progression.    Dr. Emily Filbert is Medical Director for [place] and [facility].

## 2018-06-18 DIAGNOSIS — R0609 Other forms of dyspnea: Secondary | ICD-10-CM | POA: Diagnosis not present

## 2018-06-18 NOTE — Progress Notes (Signed)
Daily Session Note  Patient Details  Name: Savannah Wilson MRN: [id number] Date of Birth: 04/05/1929 Referring Provider:     Pulmonary Rehab from 03/06/2018 in [facility] Cardiac and Pulmonary Rehab  Referring Provider  [name]      Encounter Date: 06/18/2018  Check In: Session Check In - 06/18/18 1117      Check-In   Supervising physician immediately available to respond to emergencies  [facility] immediately available ER MD    Physician(s)  Dr. Quale and Williams    Location  [facility]-Cardiac & Pulmonary Rehab    Staff Present  Joseph Hood RCP,RRT,BSRT;Carroll Enterkin, RN, BSN;Kelly Hayes, BS, ACSM CEP, Exercise Physiologist    Medication changes reported      No    Fall or balance concerns reported     No    Warm-up and Cool-down  Performed as group-led instruction    Resistance Training Performed  Yes    VAD Patient?  No    PAD/SET Patient?  No      Pain Assessment   Currently in Pain?  No/denies          Social History   Tobacco Use  Smoking Status Never Smoker  Smokeless Tobacco Never Used    Goals Met:  Independence with exercise equipment Exercise tolerated well No report of cardiac concerns or symptoms Strength training completed today  Goals Unmet:  Not Applicable  Comments: Pt able to follow exercise prescription today without complaint.  Will continue to monitor for progression.    Dr. Mark Miller is Medical Director for [facility] and [facility]. 

## 2018-06-22 ENCOUNTER — Ambulatory Visit: Payer: PPO

## 2018-06-22 DIAGNOSIS — R0609 Other forms of dyspnea: Principal | ICD-10-CM

## 2018-06-25 DIAGNOSIS — R0609 Other forms of dyspnea: Principal | ICD-10-CM

## 2018-06-25 NOTE — Progress Notes (Signed)
Daily Session Note  Patient Details  Name: Savannah Wilson MRN: [id number] Date of Birth: Jul 14, 1929 Referring Provider:     Pulmonary Rehab from 03/06/2018 in [facility] Cardiac and Pulmonary Rehab  Referring Provider  Derrel Nip      Encounter Date: 06/25/2018  Check In:      Social History   Tobacco Use  Smoking Status Never Smoker  Smokeless Tobacco Never Used    Goals Met:  Proper associated with RPD/PD & O2 Sat Independence with exercise equipment Exercise tolerated well Strength training completed today  Goals Unmet:  Not Applicable  Comments: Pt able to follow exercise prescription today without complaint.  Will continue to monitor for progression.    Dr. Emily Filbert is Medical Director for [place] and [facility].

## 2018-06-25 NOTE — Progress Notes (Signed)
Pulmonary Individual Treatment Plan  Patient Details  Name: Savannah Wilson MRN: [id number] Date of Birth: 01/30/1929 Referring Provider:     Pulmonary Rehab from 03/06/2018 in [facility]  Referring Provider  [name]      Initial Encounter Date:    Pulmonary Rehab from 03/06/2018 in [facility]  Date  03/06/18      Visit Diagnosis: Dyspnea on exertion  Patient's Home Medications on Admission:  Current Outpatient Medications:  .  acetaminophen (TYLENOL) 650 MG CR tablet, Take 650 mg by mouth every 8 (eight) hours as needed for pain. , Disp: , Rfl:  .  ALPRAZolam (XANAX) 0.5 MG tablet, Take 1 tablet (0.5 mg total) by mouth 2 (two) times daily as needed for anxiety., Disp: 45 tablet, Rfl: 5 .  apixaban (ELIQUIS) 2.5 MG TABS tablet, Take 1 tablet (2.5 mg total) by mouth 2 (two) times daily., Disp: 180 tablet, Rfl: 3 .  benzonatate (TESSALON) 200 MG capsule, Take 1 capsule (200 mg total) by mouth 3 (three) times daily as needed for cough., Disp: 60 capsule, Rfl: 1 .  budesonide (PULMICORT) 0.25 MG/2ML nebulizer solution, Take 2 mLs (0.25 mg total) by nebulization 2 (two) times daily., Disp: 60 mL, Rfl: 12 .  cetirizine (ZYRTEC ALLERGY) 10 MG tablet, Take 10 mg by mouth daily., Disp: , Rfl:  .  Cholecalciferol (VITAMIN D-3) 1000 units CAPS, Take 1,000 Units by mouth daily. , Disp: , Rfl:  .  escitalopram (LEXAPRO) 10 MG tablet, Take 1 tablet (10 mg total) by mouth daily. After dinner, Disp: 90 tablet, Rfl: 1 .  fluticasone (FLONASE) 50 MCG/ACT nasal spray, Place 2 sprays into the nose daily. (Patient taking differently: Place 2 sprays into the nose daily as needed (For allergies.). ), Disp: 16 g, Rfl: 6 .  furosemide (LASIX) 20 MG tablet, TAKE ONE TABLET BY MOUTH EVERY DAY, Disp: 90 tablet, Rfl: 0 .  furosemide (LASIX) 40 MG tablet, Take 40 mg by mouth daily., Disp: , Rfl:  .  hydrALAZINE (APRESOLINE) 50 MG tablet, Take 1 tablet (50 mg total) by mouth 2  (two) times daily. for hypertension, Disp: 180 tablet, Rfl: 3 .  ipratropium-albuterol (DUONEB) 0.5-2.5 (3) MG/3ML SOLN, Take 3 mLs by nebulization every 6 (six) hours as needed., Disp: 360 mL, Rfl: 0 .  levothyroxine (SYNTHROID, LEVOTHROID) 100 MCG tablet, TAKE ONE TABLET BY MOUTH EVERY DAY FOR THYROID, Disp: 90 tablet, Rfl: 0 .  losartan (COZAAR) 50 MG tablet, Take 1 tablet (50 mg total) by mouth daily., Disp: 90 tablet, Rfl: 0 .  mupirocin ointment (BACTROBAN) 2 %, Place 1 application into the nose 2 (two) times daily., Disp: 22 g, Rfl: 0 .  omeprazole (PRILOSEC) 40 MG capsule, TAKE ONE CAPSULE BY MOUTH TWICE A DAY. BEFORE BREAKFAST AND SUPPER, Disp: 180 capsule, Rfl: 1 .  PROAIR HFA 108 (90 BASE) MCG/ACT inhaler, INHALE 2 PUFFS EVERY 6 HOURS AS NEEDED FOR WHEEZING, Disp: 8.5 g, Rfl: 3 .  rOPINIRole (REQUIP) 0.25 MG tablet, Take 1 tablet (0.25 mg total) by mouth 3 (three) times daily., Disp: 90 tablet, Rfl: 0 .  Spacer/Aero-Holding Chambers (OPTICHAMBER ADVANTAGE) MISC, 1 each by Other route once. Always uses her when you're using a metered-dose inhaler. You've aromatase medicine as much, he won't have his much side effect, but you it twice as much medicine and your lungs., Disp: 1 each, Rfl: 0 .  temazepam (RESTORIL) 15 MG capsule, TAKE 1 CAPSULE BY MOUTH AT BEDTIME AS  NEEDED FOR SLEEP, Disp: 30 capsule, Rfl: 5 .  vitamin B-12 (CYANOCOBALAMIN) 1000 MCG tablet, Take 1,000 mcg by mouth daily., Disp: , Rfl:   Past Medical History: Past Medical History:  Diagnosis Date  . Asthma   . Barrett esophagus   . Chronic bronchitis   . Chronic kidney disease (CKD), stage IV (severe) ([place])   . Depression   . Diverticulosis   . GERD (gastroesophageal reflux disease)   . Gout   . Hyperlipidemia   . Hypertension   . Hypothyroid   . Mitral regurgitation    a. TTE 02/2017: EF 55-60%, normal WM, calcified mitral annulus with mod regurg, moderately dilated LA, unable to estimate PASP  . Pancreatitis due to  biliary obstruction Savannah Wilson 2010   s/p ERCP sphincterotomy, cholecystectomy  . Peripheral neuralgia   . Persistent atrial fibrillation ([place])    a. diagnosed 01/24/17; CHADS2VASc => 5 (HTN, age x 2, vascular disease with PAD and aortic plaque, female) giving her an estimated annual stroke risk of 6.7%; b. successful DCCV 03/28/2017; c. on Eliquis  . Venous insufficiency     Tobacco Use: Social History   Tobacco Use  Smoking Status Never Smoker  Smokeless Tobacco Never Used    Labs: Recent Review Flowsheet Data    Labs for ITP Cardiac and Pulmonary Rehab Latest Ref Rng & Units 10/28/2013 06/16/2016   Cholestrol 0 - 200 mg/dL 116 124   LDLCALC 0 - 99 mg/dL 58 64   HDL >39.00 mg/dL 26.30(L) 42.50   Trlycerides 0.0 - 149.0 mg/dL 159.0(H) 87.0   Hemoglobin A1c 4.6 - 6.5 % - 5.0       Pulmonary Assessment Scores: Pulmonary Assessment Scores    Row Name 03/06/18 1112         ADL UCSD   ADL Phase  Entry     SOB Score total  55     Rest  0     Walk  2     Stairs  4     Bath  1     Dress  1     Shop  4       CAT Score   CAT Score  17       mMRC Score   mMRC Score  2        Pulmonary Function Assessment: Pulmonary Function Assessment - 03/06/18 1140      Breath   Bilateral Breath Sounds  Clear    Shortness of Breath  Yes;Limiting activity       Exercise Target Goals: Exercise Program Goal: Individual exercise prescription set using results from initial 6 min walk test and THRR while considering  patient's activity barriers and safety.   Exercise Prescription Goal: Initial exercise prescription builds to 30-45 minutes a day of aerobic activity, 2-3 days per week.  Home exercise guidelines will be given to patient during program as part of exercise prescription that the participant will acknowledge.  Activity Barriers & Risk Stratification:   6 Minute Walk: 6 Minute Walk    Row Name 03/06/18 1219         6 Minute Walk   Distance  610 feet     Walk Time  5.6  minutes     # of Rest Breaks  1     MPH  1.23     METS  0.6     RPE  13     Perceived Dyspnea   3     VO2 Peak  2.1     Symptoms  No     Resting HR  67 bpm     Resting BP  138/68     Resting Oxygen Saturation   97 %     Exercise Oxygen Saturation  during 6 min walk  95 %     Max Ex. HR  92 bpm     Max Ex. BP  138/60     2 Minute Post BP  130/64       Interval HR   1 Minute HR  86     2 Minute HR  89     3 Minute HR  92     4 Minute HR  84     5 Minute HR  88     6 Minute HR  88     2 Minute Post HR  64     Interval Heart Rate?  Yes       Interval Oxygen   Interval Oxygen?  Yes     Baseline Oxygen Saturation %  97 %     1 Minute Oxygen Saturation %  95 %     1 Minute Liters of Oxygen  0 L     2 Minute Oxygen Saturation %  95 %     2 Minute Liters of Oxygen  0 L     3 Minute Oxygen Saturation %  96 %     3 Minute Liters of Oxygen  0 L     4 Minute Oxygen Saturation %  96 %     4 Minute Liters of Oxygen  0 L     5 Minute Oxygen Saturation %  95 %     5 Minute Liters of Oxygen  0 L     6 Minute Oxygen Saturation %  96 %     6 Minute Liters of Oxygen  0 L     2 Minute Post Oxygen Saturation %  97 %     2 Minute Post Liters of Oxygen  0 L       Oxygen Initial Assessment: Oxygen Initial Assessment - 03/06/18 1145      Home Oxygen   Home Oxygen Device  None    Sleep Oxygen Prescription  None    Home Exercise Oxygen Prescription  None    Home at Rest Exercise Oxygen Prescription  None      Initial 6 min Walk   Oxygen Used  None      Program Oxygen Prescription   Program Oxygen Prescription  None      Intervention   Short Term Goals  To learn and demonstrate proper use of respiratory medications;To learn and understand importance of maintaining oxygen saturations>88%;To learn and demonstrate proper pursed lip breathing techniques or other breathing techniques.;To learn and understand importance of monitoring SPO2 with pulse oximeter and demonstrate accurate use of  the pulse oximeter.    Long  Term Goals  Verbalizes importance of monitoring SPO2 with pulse oximeter and return demonstration;Maintenance of O2 saturations>88%;Compliance with respiratory medication;Demonstrates proper use of MDI's;Exhibits proper breathing techniques, such as pursed lip breathing or other method taught during program session       Oxygen Re-Evaluation: Oxygen Re-Evaluation    Row Name 03/14/18 1126 04/11/18 1520 05/21/18 1553 06/22/18 1220       Program Oxygen Prescription   Program Oxygen Prescription  None  None  None  None      Home Oxygen   Home  Oxygen Device  None  None  None  None    Sleep Oxygen Prescription  None  None  None  None    Home Exercise Oxygen Prescription  None  None  None  None    Home at Rest Exercise Oxygen Prescription  None  None  None  None      Goals/Expected Outcomes   Short Term Goals  To learn and demonstrate proper use of respiratory medications;To learn and understand importance of maintaining oxygen saturations>88%;To learn and demonstrate proper pursed lip breathing techniques or other breathing techniques.;To learn and understand importance of monitoring SPO2 with pulse oximeter and demonstrate accurate use of the pulse oximeter.  To learn and demonstrate proper use of respiratory medications;To learn and understand importance of maintaining oxygen saturations>88%;To learn and demonstrate proper pursed lip breathing techniques or other breathing techniques.;To learn and understand importance of monitoring SPO2 with pulse oximeter and demonstrate accurate use of the pulse oximeter.  To learn and demonstrate proper use of respiratory medications;To learn and understand importance of maintaining oxygen saturations>88%;To learn and demonstrate proper pursed lip breathing techniques or other breathing techniques.;To learn and understand importance of monitoring SPO2 with pulse oximeter and demonstrate accurate use of the pulse oximeter.  To  learn and demonstrate proper use of respiratory medications;To learn and understand importance of maintaining oxygen saturations>88%;To learn and demonstrate proper pursed lip breathing techniques or other breathing techniques.;To learn and understand importance of monitoring SPO2 with pulse oximeter and demonstrate accurate use of the pulse oximeter.    Long  Term Goals  Verbalizes importance of monitoring SPO2 with pulse oximeter and return demonstration;Maintenance of O2 saturations>88%;Compliance with respiratory medication;Demonstrates proper use of MDI's;Exhibits proper breathing techniques, such as pursed lip breathing or other method taught during program session  Verbalizes importance of monitoring SPO2 with pulse oximeter and return demonstration;Maintenance of O2 saturations>88%;Compliance with respiratory medication;Demonstrates proper use of MDI's;Exhibits proper breathing techniques, such as pursed lip breathing or other method taught during program session  Verbalizes importance of monitoring SPO2 with pulse oximeter and return demonstration;Maintenance of O2 saturations>88%;Compliance with respiratory medication;Demonstrates proper use of MDI's;Exhibits proper breathing techniques, such as pursed lip breathing or other method taught during program session  Verbalizes importance of monitoring SPO2 with pulse oximeter and return demonstration;Maintenance of O2 saturations>88%;Compliance with respiratory medication;Demonstrates proper use of MDI's;Exhibits proper breathing techniques, such as pursed lip breathing or other method taught during program session    Comments  Reviewed PLB technique with pt.  Talked about how it work and it's important to maintaining his exercise saturations.    Patient checks her oxygen once in awhile when she is at home. Talked about the importance of checking her oxygen at home and making sure she is 88 percent or above.  Patient has been monitoring her oxygen at home  whith exertion. She states she is in the high 80's to low 90's with exertion. She is walking slightly faster on the treadmill and does not require any oxyen.  Savannah Wilson needs to work on PLB more when she is exercising. She state that she is bad at using PLB when she is short of breath.  Patient has been working on her breathing when she is on the treadmill. Her shortness of breath is improving slightly. She has been enjoying the program and is close to graduating.    Goals/Expected Outcomes  Short: Become more profiecient at using PLB.   Long: Become independent at using PLB.  Short: monitor oxygen at home with  exertion. Long: maintain oxygen saturations above 88 percent independently.  Short: work on PLB while on the treadmill. Long: use PLB when short of breath independently.  Short: continue to work on PLB and attend regularly. Long: use PLB independently and graduate [employer].       Oxygen Discharge (Final Oxygen Re-Evaluation): Oxygen Re-Evaluation - 06/22/18 1220      Program Oxygen Prescription   Program Oxygen Prescription  None      Home Oxygen   Home Oxygen Device  None    Sleep Oxygen Prescription  None    Home Exercise Oxygen Prescription  None    Home at Rest Exercise Oxygen Prescription  None      Goals/Expected Outcomes   Short Term Goals  To learn and demonstrate proper use of respiratory medications;To learn and understand importance of maintaining oxygen saturations>88%;To learn and demonstrate proper pursed lip breathing techniques or other breathing techniques.;To learn and understand importance of monitoring SPO2 with pulse oximeter and demonstrate accurate use of the pulse oximeter.    Long  Term Goals  Verbalizes importance of monitoring SPO2 with pulse oximeter and return demonstration;Maintenance of O2 saturations>88%;Compliance with respiratory medication;Demonstrates proper use of MDI's;Exhibits proper breathing techniques, such as pursed lip breathing or other method  taught during program session    Comments  Patient has been working on her breathing when she is on the treadmill. Her shortness of breath is improving slightly. She has been enjoying the program and is close to graduating.    Goals/Expected Outcomes  Short: continue to work on PLB and attend regularly. Long: use PLB independently and graduate [employer].       Initial Exercise Prescription: Initial Exercise Prescription - 03/06/18 1200      Date of Initial Exercise RX and Referring Provider   Date  03/06/18    Referring Provider  [name]      Treadmill   MPH  1    Grade  0    Minutes  14    METs  1.7      NuStep   Level  1    SPM  80    Minutes  15    METs  1.5      Biostep-RELP   Level  1    SPM  50    Minutes  15    METs  1.5      Prescription Details   Frequency (times per week)  2    Duration  Progress to 45 minutes of aerobic exercise without signs/symptoms of physical distress      Intensity   THRR 40-80% of Max Heartrate  94-121    Ratings of Perceived Exertion  11-13    Perceived Dyspnea  0-4      Resistance Training   Training Prescription  Yes    Weight  2 lb    Reps  10-15       Perform Capillary Blood Glucose checks as needed.  Exercise Prescription Changes: Exercise Prescription Changes    Row Name 03/20/18 1400 03/28/18 1200 04/02/18 1500 04/18/18 0800 05/02/18 1500     Response to Exercise   Blood Pressure (Admit)  124/66  -  116/60  130/82  124/82   Blood Pressure (Exercise)  138/68  -  -  -  -   Blood Pressure (Exit)  120/82  -  110/72  118/64  128/62   Heart Rate (Admit)  69 bpm  -  75 bpm  70 bpm  59  bpm   Heart Rate (Exercise)  90 bpm  -  82 bpm  90 bpm  122 bpm   Heart Rate (Exit)  68 bpm  -  66 bpm  67 bpm  62 bpm   Oxygen Saturation (Admit)  97 %  -  64 %  98 %  97 %   Oxygen Saturation (Exercise)  97 %  -  100 %  97 %  96 %   Oxygen Saturation (Exit)  100 %  -  98 %  99 %  99 %   Rating of Perceived Exertion (Exercise)  15  -  15   13  13    Perceived Dyspnea (Exercise)  3  -  1  0  2   Symptoms  SOB and fatigue  -  SOB and fatigue  SOB and fatigue  SOB and fatigue   Comments  second full day of exercise  -  -  -  -   Duration  Progress to 45 minutes of aerobic exercise without signs/symptoms of physical distress  -  Continue with 45 min of aerobic exercise without signs/symptoms of physical distress.  Continue with 45 min of aerobic exercise without signs/symptoms of physical distress.  Continue with 45 min of aerobic exercise without signs/symptoms of physical distress.   Intensity  THRR unchanged  -  THRR unchanged  THRR unchanged  THRR unchanged     Progression   Progression  Continue to progress workloads to maintain intensity without signs/symptoms of physical distress.  -  Continue to progress workloads to maintain intensity without signs/symptoms of physical distress.  Continue to progress workloads to maintain intensity without signs/symptoms of physical distress.  Continue to progress workloads to maintain intensity without signs/symptoms of physical distress.   Average METs  1.84  -  1.79  1.79  1.79     Resistance Training   Training Prescription  Yes  -  Yes  Yes  Yes   Weight  2 lbs  -  2 lbs  2 lbs  2 lbs   Reps  10-15  -  10-15  10-15  10-15     Interval Training   Interval Training  No  -  No  No  No     Treadmill   MPH  1  -  1  1  1    Grade  0  -  0  0  0   Minutes  15  -  15  15  15    METs  1.7  -  1.77  1.77  1.77     NuStep   Level  1  -  1  1  1    Minutes  15  -  15  15  15    METs  1.7  -  1.6  1.6  1.6     Biostep-RELP   Level  1  -  1  1  1    Minutes  15  -  15  15  15    METs  2  -  2  2  2      Home Exercise Plan   Plans to continue exercise at  -  Home (comment) walking  Home (comment) walking  Home (comment) walking  Home (comment) walking   Frequency  -  Add 1 additional day to program exercise sessions.  Add 1 additional day to program exercise sessions.  Add 1 additional day to  program exercise sessions.  Add 1  additional day to program exercise sessions.   Initial Home Exercises Provided  -  03/28/18  03/28/18  03/28/18  03/28/18   Row Name 05/15/18 0900 05/30/18 1400 06/12/18 1600         Response to Exercise   Blood Pressure (Admit)  104/60  106/80  120/78     Blood Pressure (Exit)  108/68  124/62  116/66     Heart Rate (Admit)  64 bpm  59 bpm  75 bpm     Heart Rate (Exercise)  71 bpm  102 bpm  67 bpm     Heart Rate (Exit)  84 bpm  58 bpm  59 bpm     Oxygen Saturation (Admit)  8 %  98 %  96 %     Oxygen Saturation (Exercise)  97 %  95 %  97 %     Oxygen Saturation (Exit)  98 %  98 %  97 %     Rating of Perceived Exertion (Exercise)  13  13  13      Perceived Dyspnea (Exercise)  1  0  1     Symptoms  SOB  none  none     Duration  Continue with 45 min of aerobic exercise without signs/symptoms of physical distress.  Continue with 45 min of aerobic exercise without signs/symptoms of physical distress.  Continue with 45 min of aerobic exercise without signs/symptoms of physical distress.     Intensity  THRR unchanged  THRR unchanged  THRR unchanged       Progression   Progression  Continue to progress workloads to maintain intensity without signs/symptoms of physical distress.  Continue to progress workloads to maintain intensity without signs/symptoms of physical distress.  Continue to progress workloads to maintain intensity without signs/symptoms of physical distress.     Average METs  1.79  1.96  1.9       Resistance Training   Training Prescription  Yes  Yes  Yes     Weight  2 lbs  2 lbs  2 lbs     Reps  10-15  10-15  10-15       Interval Training   Interval Training  No  No  No       Treadmill   MPH  1  1.3  1.2     Grade  0  0.5  0.5     Minutes  15  15  15      METs  1.77  2.09  2       NuStep   Level  1  3  3      Minutes  15  15  15      METs  1.6  1.8  1.7       Biostep-RELP   Level  1  3  3      Minutes  15  15  15      METs  2  2  2         Home Exercise Plan   Plans to continue exercise at  Home (comment) walking  Home (comment) walking  Home (comment) walking     Frequency  Add 2 additional days to program exercise sessions.  Add 2 additional days to program exercise sessions.  Add 2 additional days to program exercise sessions.     Initial Home Exercises Provided  03/28/18  03/28/18  03/28/18        Exercise Comments: Exercise Comments    Row Name 03/14/18 1124  Exercise Comments  First full day of exercise!  Patient was oriented to gym and equipment including functions, settings, policies, and procedures.  Patient's individual exercise prescription and treatment plan were reviewed.  All starting workloads were established based on the results of the 6 minute walk test done at initial orientation visit.  The plan for exercise progression was also introduced and progression will be customized based on patient's performance and goals          Exercise Goals and Review: Exercise Goals    Row Name 03/06/18 1218             Exercise Goals   Increase Physical Activity  Yes       Intervention  Provide advice, education, support and counseling about physical activity/exercise needs.;Develop an individualized exercise prescription for aerobic and resistive training based on initial evaluation findings, risk stratification, comorbidities and participant's personal goals.       Expected Outcomes  Short Term: Attend rehab on a regular basis to increase amount of physical activity.;Long Term: Add in home exercise to make exercise part of routine and to increase amount of physical activity.;Long Term: Exercising regularly at least 3-5 days a week.       Increase Strength and Stamina  Yes       Intervention  Provide advice, education, support and counseling about physical activity/exercise needs.;Develop an individualized exercise prescription for aerobic and resistive training based on initial evaluation findings, risk  stratification, comorbidities and participant's personal goals.       Expected Outcomes  Short Term: Increase workloads from initial exercise prescription for resistance, speed, and METs.;Short Term: Perform resistance training exercises routinely during rehab and add in resistance training at home;Long Term: Improve cardiorespiratory fitness, muscular endurance and strength as measured by increased METs and functional capacity (6MWT)       Able to understand and use rate of perceived exertion (RPE) scale  Yes       Intervention  Provide education and explanation on how to use RPE scale       Expected Outcomes  Short Term: Able to use RPE daily in rehab to express subjective intensity level;Long Term:  Able to use RPE to guide intensity level when exercising independently       Able to understand and use Dyspnea scale  Yes       Intervention  Provide education and explanation on how to use Dyspnea scale       Expected Outcomes  Short Term: Able to use Dyspnea scale daily in rehab to express subjective sense of shortness of breath during exertion;Long Term: Able to use Dyspnea scale to guide intensity level when exercising independently       Knowledge and understanding of Target Heart Rate Range (THRR)  Yes       Intervention  Provide education and explanation of THRR including how the numbers were predicted and where they are located for reference       Expected Outcomes  Short Term: Able to state/look up THRR;Short Term: Able to use daily as guideline for intensity in rehab;Long Term: Able to use THRR to govern intensity when exercising independently       Able to check pulse independently  Yes       Intervention  Provide education and demonstration on how to check pulse in carotid and radial arteries.;Review the importance of being able to check your own pulse for safety during independent exercise       Expected Outcomes  Short Term: Able to explain why pulse checking is important during independent  exercise;Long Term: Able to check pulse independently and accurately       Understanding of Exercise Prescription  Yes       Intervention  Provide education, explanation, and written materials on patient's individual exercise prescription       Expected Outcomes  Short Term: Able to explain program exercise prescription;Long Term: Able to explain home exercise prescription to exercise independently          Exercise Goals Re-Evaluation : Exercise Goals Re-Evaluation    Row Name 03/14/18 1126 03/20/18 1447 03/28/18 1212 04/02/18 1554 04/18/18 0821     Exercise Goal Re-Evaluation   Exercise Goals Review  Increase Physical Activity;Increase Strength and Stamina;Able to understand and use rate of perceived exertion (RPE) scale;Able to understand and use Dyspnea scale;Understanding of Exercise Prescription;Knowledge and understanding of Target Heart Rate Range (THRR)  Increase Physical Activity;Increase Strength and Stamina;Understanding of Exercise Prescription  Increase Physical Activity;Increase Strength and Stamina;Understanding of Exercise Prescription  Increase Physical Activity;Increase Strength and Stamina;Understanding of Exercise Prescription  Increase Physical Activity;Increase Strength and Stamina;Understanding of Exercise Prescription   Comments  Reviewed RPE scale, THR and program prescription with pt today.  Pt voiced understanding and was given a copy of goals to take home.   Savannah Wilson is off to a good start in rehab.  She has completed two full days of exercise.  All of her workloads are between 13-15 on the RPE scale.  We will continue to monitor her progress.   Reviewed home exercise with pt today.  Pt plans to walk at home and stores for exercise.  She is going to start by adding in one extra day at home to get to three days a week.  Reviewed THR, pulse, RPE, sign and symptoms, and when to call 911 or MD.  Also discussed weather considerations and indoor options.  Pt voiced understanding.   Savannah Wilson has been doing well in rehab.  She is still at baseline workloads, but we will try to incease the NuStep for her. We will continue to monitor her progress.   Savannah Wilson continues to do well in rehab.  She is maintain 1.6 METs on the NuStep.  We will try to increase her workloads again.  We will continue to monitor her progression.    Expected Outcomes  Short: Use RPE daily to regulate intensity. Long: Follow program prescription in THR.  Short: Continue to attend rehab regularly.  Long: Continue to follow program prescription.  Short: Add in at least one extra day a week next week.  Long: Start to exercise independently  Short: Increase workload on NuStep.  Long: Continue to increase phsycial activity levels.   Short: Increase workloads.   Long: Continue to increase strength and stamina.    Savannah Wilson Name 05/02/18 1524 05/15/18 0945 05/30/18 1428 06/12/18 1611       Exercise Goal Re-Evaluation   Exercise Goals Review  Increase Physical Activity;Increase Strength and Stamina;Understanding of Exercise Prescription  Increase Physical Activity;Increase Strength and Stamina;Understanding of Exercise Prescription  Increase Physical Activity;Increase Strength and Stamina;Understanding of Exercise Prescription  Increase Physical Activity;Increase Strength and Stamina;Understanding of Exercise Prescription    Comments  Savannah Wilson continues to do well in rehab. She is up to level 2 on the BioStep.  We will continue to monitor her progression.   Savannah Wilson has been doing well in rehab.   She is now on level 3 for the BioStep.  She continues to  work hard in here for her.  We will continue to monitor her progress.   Savannah Wilson continues to do well in rehab.  She is now up to 1.3 mph on the treadmill.  We will continue to monitor her progression.   Savannah Wilson continues to work in class. She dropped back down to 1.2 mph and we will work with her to get back up to 1.3 mph.  We will continue to monitor her progress.    Expected Outcomes  Short: Increase  workload on NuStep. Long: Continue to improve strength and stamina.   Short: Increase NuStep and treadmill.  Long: Continue to exercise at home on off days.   Short: Continue to move up workloads.  Long: Continue to encourage more activity at home.   Short: Get speed on treadmill back up to 1.3 mph.  Long: Continue to move more at home.        Discharge Exercise Prescription (Final Exercise Prescription Changes): Exercise Prescription Changes - 06/12/18 1600      Response to Exercise   Blood Pressure (Admit)  120/78    Blood Pressure (Exit)  116/66    Heart Rate (Admit)  75 bpm    Heart Rate (Exercise)  67 bpm    Heart Rate (Exit)  59 bpm    Oxygen Saturation (Admit)  96 %    Oxygen Saturation (Exercise)  97 %    Oxygen Saturation (Exit)  97 %    Rating of Perceived Exertion (Exercise)  13    Perceived Dyspnea (Exercise)  1    Symptoms  none    Duration  Continue with 45 min of aerobic exercise without signs/symptoms of physical distress.    Intensity  THRR unchanged      Progression   Progression  Continue to progress workloads to maintain intensity without signs/symptoms of physical distress.    Average METs  1.9      Resistance Training   Training Prescription  Yes    Weight  2 lbs    Reps  10-15      Interval Training   Interval Training  No      Treadmill   MPH  1.2    Grade  0.5    Minutes  15    METs  2      NuStep   Level  3    Minutes  15    METs  1.7      Biostep-RELP   Level  3    Minutes  15    METs  2      Home Exercise Plan   Plans to continue exercise at  Home (comment)   walking   Frequency  Add 2 additional days to program exercise sessions.    Initial Home Exercises Provided  03/28/18       Nutrition:  Target Goals: Understanding of nutrition guidelines, daily intake of sodium <1556m, cholesterol <2026m calories 30% from fat and 7% or less from saturated fats, daily to have 5 or more servings of fruits and vegetables.  Biometrics: Pre  Biometrics - 03/06/18 1218      Pre Biometrics   Height  5' (1.524 m)    Weight  142 lb 14.4 oz (64.8 kg)    Waist Circumference  35 inches    Hip Circumference  41.5 inches    Waist to Hip Ratio  0.84 %    BMI (Calculated)  27.91        Nutrition Therapy Plan  and Nutrition Goals: Nutrition Therapy & Goals - 03/19/18 1214      Nutrition Therapy   Diet  TLC    Drug/Food Interactions  Purine/Gout    Protein (specify units)  5-6oz    Fiber  20 grams    Whole Grain Foods  2 servings   3 ideal   Saturated Fats  10 max. grams    Fruits and Vegetables  5 servings/day   8 ideal, eats small portions   Sodium  1500 grams      Personal Nutrition Goals   Nutrition Goal  On days when appetite is decreased, try to snack consistently between meals to help meet nutritional needs and maintain CBW    Personal Goal #2  Continue to monitor fluid and sodium intake r/t CKD as directed by your nephrologist    Comments  States her appetite varies day to day, but her eating pattern is typically a larger breakfast, small lunch and moderate dinner with snacks semi-regularly      Intervention Plan   Intervention  Prescribe, educate and counsel regarding individualized specific dietary modifications aiming towards targeted core components such as weight, hypertension, lipid management, diabetes, heart failure and other comorbidities.    Expected Outcomes  Short Term Goal: Understand basic principles of dietary content, such as calories, fat, sodium, cholesterol and nutrients.;Short Term Goal: A plan has been developed with personal nutrition goals set during dietitian appointment.;Long Term Goal: Adherence to prescribed nutrition plan.       Nutrition Assessments: Nutrition Assessments - 03/06/18 1138      MEDFICTS Scores   Pre Score  36       Nutrition Goals Re-Evaluation: Nutrition Goals Re-Evaluation    Savannah Wilson Name 03/19/18 1227 04/11/18 1541 05/02/18 1226         Goals   Nutrition Goal  On  days when appetite is decreased, try to snack consistently between meals to help meet nutritional needs and maintain CBW  Patient wants to maintain her weight and make healthier choices.  On days when appetite is decreased, try to add in snacks in order to help meet daily nutritional needs; continue to monitor sodium as directed by nephrologist; maintain CBW     Comment  Reports her appeitite to vary day-to-day but feels that she tries to eat a variety of foods  Sometimes her appetite is good and sometimes she is not so hungry. She tries not to over eat.  She reports appetite to be better recently. She is snacking and eats sources of protein daily. Some common foods include nuts, fruit and chicken. She monitors sodium intake regularly     Expected Outcome  She will eat on a consistent schedule each day, prioritizing total calories and protein intake  Short: make healthy eating choices when hungry. Long: maintain weight independently following dieticians reccomendations.  Maintain CBW and practice a regular eating schedule to meet energy needs. Continue to monitor and moderate sodium intake       Personal Goal #2 Re-Evaluation   Personal Goal #2  Continue to monitor fluid and sodium intake r/t CKD as directed by your nephrologist  -  -        Nutrition Goals Discharge (Final Nutrition Goals Re-Evaluation): Nutrition Goals Re-Evaluation - 05/02/18 1226      Goals   Nutrition Goal  On days when appetite is decreased, try to add in snacks in order to help meet daily nutritional needs; continue to monitor sodium as directed by nephrologist; maintain CBW  Comment  She reports appetite to be better recently. She is snacking and eats sources of protein daily. Some common foods include nuts, fruit and chicken. She monitors sodium intake regularly    Expected Outcome  Maintain CBW and practice a regular eating schedule to meet energy needs. Continue to monitor and moderate sodium intake        Psychosocial: Target Goals: Acknowledge presence or absence of significant depression and/or stress, maximize coping skills, provide positive support system. Participant is able to verbalize types and ability to use techniques and skills needed for reducing stress and depression.   Initial Review & Psychosocial Screening: Initial Psych Review & Screening - 03/06/18 1135      Initial Review   Current issues with  Current Sleep Concerns;Current Stress Concerns    Source of Stress Concerns  Chronic Illness    Comments  She gets short of breath walking.      Family Dynamics   Good Support System?  Yes    Comments  She can look to her daugher Tammy for support      Barriers   Psychosocial barriers to participate in program  The patient should benefit from training in stress management and relaxation.      Screening Interventions   Interventions  Encouraged to exercise;Provide feedback about the scores to participant;Program counselor consult;To provide support and resources with identified psychosocial needs    Expected Outcomes  Short Term goal: Utilizing psychosocial counselor, staff and physician to assist with identification of specific Stressors or current issues interfering with healing process. Setting desired goal for each stressor or current issue identified.;Long Term Goal: Stressors or current issues are controlled or eliminated.;Short Term goal: Identification and review with participant of any Quality of Life or Depression concerns found by scoring the questionnaire.;Long Term goal: The participant improves quality of Life and PHQ9 Scores as seen by post scores and/or verbalization of changes       Quality of Life Scores:  Scores of 19 and below usually indicate a poorer quality of life in these areas.  A difference of  2-3 points is a clinically meaningful difference.  A difference of 2-3 points in the total score of the Quality of Life Index has been associated with  significant improvement in overall quality of life, self-image, physical symptoms, and general health in studies assessing change in quality of life.  PHQ-9: Recent Review Flowsheet Data    Depression screen Savannah Wilson 2/9 03/08/2018 03/06/2018 09/28/2017 07/26/2016 01/29/2015   Decreased Interest 0 1 1 0 0   Down, Depressed, Hopeless 1  1 3  0 0   PHQ - 2 Score 1 2 4  0 0   Altered sleeping 2 2 2  - -   Tired, decreased energy 2 2 3  - -   Change in appetite 1 2 2  - -   Feeling bad or failure about yourself  0 0 0 - -   Trouble concentrating 0 0 0 - -   Moving slowly or fidgety/restless 0 0 0 - -   Suicidal thoughts 0 0 0 - -   PHQ-9 Score 6 8 11  - -   Difficult doing work/chores Not difficult at all Not difficult at all Somewhat difficult - -     Interpretation of Total Score  Total Score Depression Severity:  1-4 = Minimal depression, 5-9 = Mild depression, 10-14 = Moderate depression, 15-19 = Moderately severe depression, 20-27 = Severe depression   Psychosocial Evaluation and Intervention: Psychosocial Evaluation - 03/14/18  1230      Psychosocial Evaluation & Interventions   Interventions  Encouraged to exercise with the program and follow exercise prescription;Stress management education    Comments  Counselor met with Savannah Wilson today Savannah Wilson) for initial psychsocial evaluation.  She is an 38 (almost 72) year old who has COPD.  Although Sabiha lives alone she has a strong support system with a daughter; close friends and church family who are very involved in her life.  In addition to her pulmonary condition, Lovada mentioned a heart and kidney condition - but was unclear about details on those.  She reports sleeping "fair" with ~6 hours most nights of the week.  She is on medication that helps with this.  Savannah Wilson reports her appetite has been up and down lately.  She states that anxiety symptoms have been a problem more recently and she was put on medication by her Dr. that "made me sick."  So it was  discontinued about a month ago.  She is typically in a positive mood most of the time and reports minimal stress other than her health and living alone - caring for the house, etc by herself.  Savannah Wilson became tearful today as she mentioned the loss of one of her daughter approximately 18 months ago to Bone Cancer.  She relies on her faith and her support system to help her through this.  Evelise has goals to walk and breathe better while in this program.  Staff will follow with her.    Expected Outcomes  Short:  Savannah Wilson will exercise consistently for her stamina and strength and to help with sleep and stress management   Long:  Savannah Wilson will develop a routine of exercise to have a better quality of life.      Continue Psychosocial Services   Follow up required by staff       Psychosocial Re-Evaluation: Psychosocial Re-Evaluation    Savannah Wilson Name 04/11/18 1535 05/23/18 1225 06/22/18 1227         Psychosocial Re-Evaluation   Current issues with  Current Sleep Concerns;Current Stress Concerns  Current Sleep Concerns;Current Stress Concerns  Current Stress Concerns     Comments  Patient has improved slightly with her shortness of breath. She went to the beach recently with her friends and had a great time eating crab. Her birthday is tomorrow and she will be 79. She is a happer person and wants to be in shape.  Savannah Wilson states that her sleeping is better due to an anti anxiety pill that she is on. She was going to bed and then having alot on her mind and could not fall asleep. Now she gets to sleep easier and is doing better. She feels better in general as well. She will sometimes think about her daughter that has passed away and that stresses her out. The holiday is hard for her when the weather gets cold.  Patient has a good overall mental health state when she is participating in [place]. She has been working hard on her exercises and was informed to continue post [facility] to maintain a healthy state of mind.       Expected Outcomes  Short: Attend [facility] stress management education to decrease stress. Long: Maintain exercise Post [facility] to keep stress at a minimum.  short: consult with the mental health conselor on staff. Long: take psychosocial and stress managment classes.  Short: Attend [facility] stress management education to decrease stress. Long: Maintain exercise Post [facility] to keep stress at a  minimum.     Interventions  Encouraged to attend Pulmonary Rehabilitation for the exercise  Encouraged to attend Pulmonary Rehabilitation for the exercise  Encouraged to attend Pulmonary Rehabilitation for the exercise     Continue Psychosocial Services   Follow up required by staff  Follow up required by staff  Follow up required by staff        Psychosocial Discharge (Final Psychosocial Re-Evaluation): Psychosocial Re-Evaluation - 06/22/18 1227      Psychosocial Re-Evaluation   Current issues with  Current Stress Concerns    Comments  Patient has a good overall mental health state when she is participating in [place]. She has been working hard on her exercises and was informed to continue post [facility] to maintain a healthy state of mind.     Expected Outcomes  Short: Attend [facility] stress management education to decrease stress. Long: Maintain exercise Post [facility] to keep stress at a minimum.    Interventions  Encouraged to attend Pulmonary Rehabilitation for the exercise    Continue Psychosocial Services   Follow up required by staff       Education: Education Goals: Education classes will be provided on a weekly basis, covering required topics. Participant will state understanding/return demonstration of topics presented.  Learning Barriers/Preferences: Learning Barriers/Preferences - 03/06/18 1145      Learning Barriers/Preferences   Learning Barriers  Sight   wears glasses   Learning Preferences  None       Education Topics:  Initial Evaluation Education: - Verbal, written  and demonstration of respiratory meds, oximetry and breathing techniques. Instruction on use of nebulizers and MDIs and importance of monitoring MDI activations.   Pulmonary Rehab from 06/13/2018 in [facility] Cardiac and Pulmonary Rehab  Date  03/06/18  Educator  [facility]  Instruction Review Code  1- Verbalizes Understanding      General Nutrition Guidelines/Fats and Fiber: -Group instruction provided by verbal, written material, models and posters to present the general guidelines for heart healthy nutrition. Gives an explanation and review of dietary fats and fiber.   Pulmonary Rehab from 06/13/2018 in [facility] Cardiac and Pulmonary Rehab  Date  06/13/18  Educator  [facility]  Instruction Review Code  1- Verbalizes Understanding      Controlling Sodium/Reading Food Labels: -Group verbal and written material supporting the discussion of sodium use in heart healthy nutrition. Review and explanation with models, verbal and written materials for utilization of the food label.   Exercise Physiology & General Exercise Guidelines: - Group verbal and written instruction with models to review the exercise physiology of the cardiovascular system and associated critical values. Provides general exercise guidelines with specific guidelines to those with heart or lung disease.    Pulmonary Rehab from 06/13/2018 in [facility] Cardiac and Pulmonary Rehab  Date  05/02/18  Educator  [facility]  Instruction Review Code  1- Verbalizes Understanding      Aerobic Exercise & Resistance Training: - Gives group verbal and written instruction on the various components of exercise. Focuses on aerobic and resistive training programs and the benefits of this training and how to safely progress through these programs.   Pulmonary Rehab from 06/13/2018 in [facility] Cardiac and Pulmonary Rehab  Date  05/16/18  Educator  [facility]  Instruction Review Code  1- Verbalizes Understanding      Flexibility, Balance, Mind/Body Relaxation: Provides group  verbal/written instruction on the benefits of flexibility and balance training, including mind/body exercise modes such as yoga, pilates and tai chi.  Demonstration and skill practice  provided.   Pulmonary Rehab from 06/13/2018 in [facility] Cardiac and Pulmonary Rehab  Date  05/30/18  Educator  [facility]  Instruction Review Code  1- Verbalizes Understanding      Stress and Anxiety: - Provides group verbal and written instruction about the health risks of elevated stress and causes of high stress.  Discuss the correlation between heart/lung disease and anxiety and treatment options. Review healthy ways to manage with stress and anxiety.   Pulmonary Rehab from 06/13/2018 in [facility] Cardiac and Pulmonary Rehab  Date  03/14/18  Educator  [facility]  Instruction Review Code  1- Verbalizes Understanding      Depression: - Provides group verbal and written instruction on the correlation between heart/lung disease and depressed mood, treatment options, and the stigmas associated with seeking treatment.   Pulmonary Rehab from 06/13/2018 in [facility] Cardiac and Pulmonary Rehab  Date  05/23/18  Educator  [facility]  Instruction Review Code  1- Verbalizes Understanding      Exercise & Equipment Safety: - Individual verbal instruction and demonstration of equipment use and safety with use of the equipment.   Pulmonary Rehab from 06/13/2018 in [facility] and Pulmonary Rehab  Date  03/06/18  Educator  [facility]  Instruction Review Code  1- Verbalizes Understanding      Infection Prevention: - Provides verbal and written material to individual with discussion of infection control including proper hand washing and proper equipment cleaning during exercise session.   Pulmonary Rehab from 06/13/2018 in [facility] Cardiac and Pulmonary Rehab  Date  03/06/18  Educator  [facility]  Instruction Review Code  1- Verbalizes Understanding      Falls Prevention: - Provides verbal and written material to individual with discussion of falls prevention and  safety.   Pulmonary Rehab from 06/13/2018 in [facility] and Pulmonary Rehab  Date  03/06/18  Educator  [facility]  Instruction Review Code  1- Verbalizes Understanding      Diabetes: - Individual verbal and written instruction to review signs/symptoms of diabetes, desired ranges of glucose level fasting, after meals and with exercise. Advice that pre and post exercise glucose checks will be done for 3 sessions at entry of program.   Chronic Lung Diseases: - Group verbal and written instruction to review updates, respiratory medications, advancements in procedures and treatments. Discuss use of supplemental oxygen including available portable oxygen systems, continuous and intermittent flow rates, concentrators, personal use and safety guidelines. Review proper use of inhaler and spacers. Provide informative websites for self-education.    Energy Conservation: - Provide group verbal and written instruction for methods to conserve energy, plan and organize activities. Instruct on pacing techniques, use of adaptive equipment and posture/positioning to relieve shortness of breath.   Pulmonary Rehab from 06/13/2018 in [facility] Cardiac and Pulmonary Rehab  Date  06/06/18  Educator  [facility]  Instruction Review Code  1- Verbalizes Understanding      Triggers and Exacerbations: - Group verbal and written instruction to review types of environmental triggers and ways to prevent exacerbations. Discuss weather changes, air quality and the benefits of nasal washing. Review warning signs and symptoms to help prevent infections. Discuss techniques for effective airway clearance, coughing, and vibrations.   Pulmonary Rehab from 06/13/2018 in [facility] Cardiac and Pulmonary Rehab  Date  05/09/18  Educator  [facility]  Instruction Review Code  1- Verbalizes Understanding      AED/CPR: - Group verbal and written instruction with the use of models to demonstrate the basic use of the AED with the  basic ABC's of  resuscitation.   Anatomy and Physiology of the Lungs: - Group verbal and written instruction with the use of models to provide basic lung anatomy and physiology related to function, structure and complications of lung disease.   Anatomy & Physiology of the Heart: - Group verbal and written instruction and models provide basic cardiac anatomy and physiology, with the coronary electrical and arterial systems. Review of Valvular disease and Heart Failure   Cardiac Medications: - Group verbal and written instruction to review commonly prescribed medications for heart disease. Reviews the medication, class of the drug, and side effects.   Pulmonary Rehab from 06/13/2018 in [facility] Cardiac and Pulmonary Rehab  Date  03/21/18  Educator  [place] [facility]  Instruction Review Code  1- Verbalizes Understanding      Know Your Numbers and Risk Factors: -Group verbal and written instruction about important numbers in your health.  Discussion of what are risk factors and how they play a role in the disease process.  Review of Cholesterol, Blood Pressure, Diabetes, and BMI and the role they play in your overall health.   Sleep Hygiene: -Provides group verbal and written instruction about how sleep can affect your health.  Define sleep hygiene, discuss sleep cycles and impact of sleep habits. Review good sleep hygiene tips.    Pulmonary Rehab from 06/13/2018 in [facility]  Date  04/11/18  Educator  [facility]  Instruction Review Code  1- Verbalizes Understanding      Other: -Provides group and verbal instruction on various topics (see comments)    Knowledge Questionnaire Score: Knowledge Questionnaire Score - 03/06/18 1140      Knowledge Questionnaire Score   Pre Score  11/18   reviewed with patient       Core Components/Risk Factors/Patient Goals at Admission: Personal Goals and Risk Factors at Admission - 03/06/18 1146      Core Components/Risk Factors/Patient Goals on Admission     Weight Management  Yes;Weight Maintenance;Weight Loss    Intervention  Weight Management: Develop a combined nutrition and exercise program designed to reach desired caloric intake, while maintaining appropriate intake of nutrient and fiber, sodium and fats, and appropriate energy expenditure required for the weight goal.;Weight Management: Provide education and appropriate resources to help participant work on and attain dietary goals.;Weight Management/Obesity: Establish reasonable short term and long term weight goals.    Admit Weight  142 lb 14.4 oz (64.8 kg)    Goal Weight: Short Term  135 lb (61.2 kg)    Goal Weight: Long Term  135 lb (61.2 kg)    Expected Outcomes  Short Term: Continue to assess and modify interventions until short term weight is achieved;Long Term: Adherence to nutrition and physical activity/exercise program aimed toward attainment of established weight goal;Weight Maintenance: Understanding of the daily nutrition guidelines, which includes 25-35% calories from fat, 7% or less cal from saturated fats, less than 266m cholesterol, less than 1.5gm of sodium, & 5 or more servings of fruits and vegetables daily;Understanding recommendations for meals to include 15-35% energy as protein, 25-35% energy from fat, 35-60% energy from carbohydrates, less than 2066mof dietary cholesterol, 20-35 gm of total fiber daily;Understanding of distribution of calorie intake throughout the day with the consumption of 4-5 meals/snacks;Weight Loss: Understanding of general recommendations for a balanced deficit meal plan, which promotes 1-2 lb weight loss per week and includes a negative energy balance of [phone] kcal/d    Improve shortness of breath with ADL's  Yes  Intervention  Provide education, individualized exercise plan and daily activity instruction to help decrease symptoms of SOB with activities of daily living.    Expected Outcomes  Short Term: Improve cardiorespiratory fitness to achieve  a reduction of symptoms when performing ADLs;Long Term: Be able to perform more ADLs without symptoms or delay the onset of symptoms    Hypertension  Yes    Intervention  Provide education on lifestyle modifcations including regular physical activity/exercise, weight management, moderate sodium restriction and increased consumption of fresh fruit, vegetables, and low fat dairy, alcohol moderation, and smoking cessation.;Monitor prescription use compliance.    Expected Outcomes  Short Term: Continued assessment and intervention until BP is < 140/51m HG in hypertensive participants. < 130/833mHG in hypertensive participants with diabetes, heart failure or chronic kidney disease.;Long Term: Maintenance of blood pressure at goal levels.       Core Components/Risk Factors/Patient Goals Review:  Goals and Risk Factor Review    Row Name 04/11/18 1543 05/23/18 1229 06/22/18 1224         Core Components/Risk Factors/Patient Goals Review   Personal Goals Review  Weight Management/Obesity;Hypertension;Improve shortness of breath with ADL's  Weight Management/Obesity;Hypertension;Improve shortness of breath with ADL's  Weight Management/Obesity;Hypertension;Improve shortness of breath with ADL's     Review  Patient is working on losing a few pounds then maintaining her weight. Her blood pressure has been better since she started the program. Her shortness of breath has improved slightly. She has been walking at [employer] at home. She still gets short of breath when she walks for a long time. Patient states she wants to be able to walk further without getting short of breath.  -  Savannah Wilson blood pressure has been improving since the start of the program. Savannah Wilson trying to stay busy and complete the program.     Expected Outcomes  Short: Attend [facility] regularly to improve shortness of breath with ADL's. Long: maintain independence with ADL's   -  Short: graduate [facility]: maintain exercise  independently.        Core Components/Risk Factors/Patient Goals at Discharge (Final Review):  Goals and Risk Factor Review - 06/22/18 1224      Core Components/Risk Factors/Patient Goals Review   Personal Goals Review  Weight Management/Obesity;Hypertension;Improve shortness of breath with ADL's    Review  Savannah Wilson blood pressure has been improving since the start of the program. Savannah Wilson trying to stay busy and complete the program.    Expected Outcomes  Short: graduate [facility]: maintain exercise independently.       ITP Comments: ITP Comments    Row Name 03/06/18 1110 04/02/18 0842 04/30/18 0847 05/28/18 0905 06/25/18 0848   ITP Comments  Medical Evaluation completed. Chart sent for review and changes to Dr. MaEmily Filbertirector of [facility]averDiagnosis can be found in [facility] encounter 08/14/17  30 day review completed. ITP sent to Dr. MaEmily Filbertirector of [place] with [place] unless changes are made by physician  30 day review completed. ITP sent to Dr. MaEmily Filbertirector of [facility]Continue with ITP unless changes are made by physician  30 day review completed. ITP sent to Dr. MaEmily Filbertirector of Lu[place]end[place]Continue with ITP unless changes are made by physician.  30 day review completed. ITP sent to Dr. MaEmily Filbertirector of Lu[place]ylvan LakeContinue with ITP unless changes are made by physician.      Comments: 30 day review

## 2018-06-27 ENCOUNTER — Encounter: Payer: PPO | Admitting: *Deleted

## 2018-06-27 DIAGNOSIS — R0609 Other forms of dyspnea: Principal | ICD-10-CM

## 2018-06-27 NOTE — Progress Notes (Signed)
Daily Session Note  Patient Details  Name: SALONI LABLANC MRN: [id number] Date of Birth: November 20, 1928 Referring Provider:     Pulmonary Rehab from 03/06/2018 in [facility] and Pulmonary Rehab  Referring Provider  Derrel Nip      Encounter Date: 06/27/2018  Check In: Session Check In - 06/27/18 1119      Check-In   Supervising physician immediately available to respond to emergencies  [facility] immediately available ER MD    Physician(s)  Drs. Cinda Quest and [facility]    Location  [facility]-Cardiac & Pulmonary Rehab    Staff Present  Renita Papa, RN BSN;Yeraldin Litzenberger Luan Pulling, [place], RCEP, CCRP, Exercise Physiologist;Joseph Tessie Fass RCP,RRT,BSRT    Medication changes reported      No    Fall or balance concerns reported     No    Warm-up and Cool-down  Performed as group-led instruction    Resistance Training Performed  Yes    VAD Patient?  No    PAD/SET Patient?  No      Pain Assessment   Currently in Pain?  No/denies          Social History   Tobacco Use  Smoking Status Never Smoker  Smokeless Tobacco Never Used    Goals Met:  Proper associated with RPD/PD & O2 Sat Independence with exercise equipment Using PLB without cueing & demonstrates good technique Exercise tolerated well No report of cardiac concerns or symptoms Strength training completed today  Goals Unmet:  Not Applicable  Comments: Pt able to follow exercise prescription today without complaint.  Will continue to monitor for progression.    Dr. Emily Filbert is Medical Director for [facility] and [facility].

## 2018-06-28 ENCOUNTER — Other Ambulatory Visit: Payer: Self-pay | Admitting: Cardiovascular Disease

## 2018-06-29 ENCOUNTER — Ambulatory Visit (INDEPENDENT_AMBULATORY_CARE_PROVIDER_SITE_OTHER): Payer: PPO

## 2018-06-29 DIAGNOSIS — Z23 Encounter for immunization: Secondary | ICD-10-CM

## 2018-07-02 DIAGNOSIS — R0609 Other forms of dyspnea: Principal | ICD-10-CM

## 2018-07-02 NOTE — Progress Notes (Signed)
Daily Session Note  Patient Details  Name: Savannah [facility] MRN: [id number] Date of Birth: 10/20/1928 Referring Provider:     Pulmonary Rehab from 03/06/2018 in [facility] and Pulmonary Rehab  Referring Provider  [name]      Encounter Date: 07/02/2018  Check In: Session Check In - 07/02/18 1401      Check-In   Supervising physician immediately available to respond to emergencies  [facility] immediately available ER MD    Physician(s)  Dr. Stafford and Williams    Location  [facility]-Cardiac & Pulmonary Rehab    Staff Present  Joseph Hood RCP,RRT,BSRT;Amanda Sommer, BA, ACSM CEP, Exercise Physiologist;Kelly Hayes, BS, ACSM CEP, Exercise Physiologist    Medication changes reported      No    Fall or balance concerns reported     No    Warm-up and Cool-down  Performed as group-led instruction    Resistance Training Performed  Yes    VAD Patient?  No    PAD/SET Patient?  No      Pain Assessment   Currently in Pain?  No/denies          Social History   Tobacco Use  Smoking Status Never Smoker  Smokeless Tobacco Never Used    Goals Met:  Independence with exercise equipment Exercise tolerated well No report of cardiac concerns or symptoms Strength training completed today  Goals Unmet:  Not Applicable  Comments: Pt able to follow exercise prescription today without complaint.  Will continue to monitor for progression.    Dr. Mark Miller is Medical Director for [facility] and [facility]. 

## 2018-07-03 ENCOUNTER — Ambulatory Visit (INDEPENDENT_AMBULATORY_CARE_PROVIDER_SITE_OTHER): Payer: PPO | Admitting: Cardiovascular Disease

## 2018-07-03 ENCOUNTER — Encounter: Payer: Self-pay | Admitting: Cardiovascular Disease

## 2018-07-03 VITALS — BP 150/62 | HR 69 | Ht 60.0 in | Wt 142.8 lb

## 2018-07-03 DIAGNOSIS — I5032 Chronic diastolic (congestive) heart failure: Secondary | ICD-10-CM

## 2018-07-03 DIAGNOSIS — I4819 Other persistent atrial fibrillation: Secondary | ICD-10-CM | POA: Diagnosis not present

## 2018-07-03 DIAGNOSIS — I10 Essential (primary) hypertension: Secondary | ICD-10-CM | POA: Diagnosis not present

## 2018-07-03 DIAGNOSIS — I2510 Atherosclerotic heart disease of native coronary artery without angina pectoris: Secondary | ICD-10-CM | POA: Diagnosis not present

## 2018-07-03 NOTE — Progress Notes (Signed)
Cardiology Office Note   Date:  07/03/2018   ID:  SCHERRY LAVERNE, DOB May 18, 1929, MRN [id number]  PCP:  Savannah Mc, MD  Cardiologist:   Kathlyn Sacramento, MD   Chief Complaint  Patient presents with  . other    4 month follow up. Meds reviewed by the pt. verbally. Pt. c/o shortness of breath & LE edema.       History of Present Illness: Savannah Wilson is a 84 y.o. female who presents for a follow-up visit regarding paroxysmal atrial fibrillation and chronic diastolic heart failure. She has known history of stage IV chronic kidney disease, asthma/COPD, essential hypertension, hypothyroidism, anemia of chronic disease and Barrett's esophagus. She underwent successful cardioversion to sinus rhythm in August, 2018. She presented to [facility] in September with worsening shortness of breath. She was found to be volume overloaded with severe hypertension on presentation. She slowly improved with diuresis which was somewhat difficult due to underlying chronic kidney disease. She had a repeat echocardiogram done which showed normal LV systolic function with inferior wall hypokinesis. There was moderate mitral regurgitation and moderate pulmonary hypertension with peak systolic pressure of 52 mmHg. She underwent a pharmacologic nuclear stress test with Lexiscan which showed evidence of prior inferior/inferolateral infarct with minimal peri-infarct ischemia. The patient had severe hypotension with Lexiscan.  She is being treated medically for presumed underlying coronary artery disease.  Small dose metoprolol was discontinued during last visit due to symptomatic bradycardia.  She has been doing well overall with stable exertional dyspnea and no chest pain.  She has been attending pulmonary rehab with overall improvement in stamina.  Her blood pressure is elevated today but reports that her blood pressure has been well controlled at rehab.  She had recent labs done with Dr. [name] and was told that her  renal function remains stable.  Past Medical History:  Diagnosis Date  . Asthma   . Barrett esophagus   . Chronic bronchitis   . Chronic kidney disease (CKD), stage IV (severe) ([place])   . Depression   . Diverticulosis   . GERD (gastroesophageal reflux disease)   . Gout   . Hyperlipidemia   . Hypertension   . Hypothyroid   . Mitral regurgitation    a. TTE 02/2017: EF 55-60%, normal WM, calcified mitral annulus with mod regurg, moderately dilated LA, unable to estimate PASP  . Pancreatitis due to biliary obstruction March 2010   s/p ERCP sphincterotomy, cholecystectomy  . Peripheral neuralgia   . Persistent atrial fibrillation    a. diagnosed 01/24/17; CHADS2VASc => 5 (HTN, age x 2, vascular disease with PAD and aortic plaque, female) giving her an estimated annual stroke risk of 6.7%; b. successful DCCV 03/28/2017; c. on Eliquis  . Venous insufficiency     Past Surgical History:  Procedure Laterality Date  . ABDOMINAL HYSTERECTOMY    . CARDIOVERSION N/A 03/28/2017   Procedure: CARDIOVERSION;  Surgeon: Nelva Bush, MD;  Location: [facility];  Service: Cardiovascular;  Laterality: N/A;  . CARPAL TUNNEL RELEASE  jan 2013   Savannah Wilson  . CATARACT EXTRACTION     right  . CATARACT EXTRACTION  2008  . CHOLECYSTECTOMY  02/10  . CHOLECYSTECTOMY  2010  . TONSILLECTOMY    . VENTRAL HERNIA REPAIR    . VENTRAL HERNIA REPAIR  2007  . VESICOVAGINAL FISTULA CLOSURE W/ TAH       Current Outpatient Medications  Medication Sig Dispense Refill  . acetaminophen (TYLENOL) 650 MG CR tablet  Take 650 mg by mouth every 8 (eight) hours as needed for pain.     Marland Kitchen ALPRAZolam (XANAX) 0.5 MG tablet Take 1 tablet (0.5 mg total) by mouth 2 (two) times daily as needed for anxiety. 45 tablet 5  . apixaban (ELIQUIS) 2.5 MG TABS tablet Take 1 tablet (2.5 mg total) by mouth 2 (two) times daily. 180 tablet 3  . benzonatate (TESSALON) 200 MG capsule Take 1 capsule (200 mg total) by mouth 3 (three) times daily  as needed for cough. 60 capsule 1  . budesonide (PULMICORT) 0.25 MG/2ML nebulizer solution Take 2 mLs (0.25 mg total) by nebulization 2 (two) times daily. 60 mL 12  . cetirizine (ZYRTEC ALLERGY) 10 MG tablet Take 10 mg by mouth daily.    . Cholecalciferol (VITAMIN D-3) 1000 units CAPS Take 1,000 Units by mouth daily.     Marland Kitchen escitalopram (LEXAPRO) 10 MG tablet Take 1 tablet (10 mg total) by mouth daily. After dinner 90 tablet 1  . fluticasone (FLONASE) 50 MCG/ACT nasal spray Place 2 sprays into the nose daily. (Patient taking differently: Place 2 sprays into the nose daily as needed (For allergies.). ) 16 g 6  . furosemide (LASIX) 40 MG tablet Take 40 mg by mouth daily.    . hydrALAZINE (APRESOLINE) 50 MG tablet TAKE ONE TABLET BY MOUTH 3 TIMES DAILY 90 tablet 1  . ipratropium-albuterol (DUONEB) 0.5-2.5 (3) MG/3ML SOLN Take 3 mLs by nebulization every 6 (six) hours as needed. 360 mL 0  . levothyroxine (SYNTHROID, LEVOTHROID) 100 MCG tablet TAKE ONE TABLET BY MOUTH EVERY DAY FOR THYROID 90 tablet 0  . losartan (COZAAR) 50 MG tablet Take 1 tablet (50 mg total) by mouth daily. 90 tablet 0  . mupirocin ointment (BACTROBAN) 2 % Place 1 application into the nose 2 (two) times daily. 22 g 0  . omeprazole (PRILOSEC) 40 MG capsule TAKE ONE CAPSULE BY MOUTH TWICE A DAY. BEFORE BREAKFAST AND SUPPER 180 capsule 1  . PROAIR HFA 108 (90 BASE) MCG/ACT inhaler INHALE 2 PUFFS EVERY 6 HOURS AS NEEDED FOR WHEEZING 8.5 g 3  . rOPINIRole (REQUIP) 0.25 MG tablet Take 1 tablet (0.25 mg total) by mouth 3 (three) times daily. 90 tablet 0  . Spacer/Aero-Holding Chambers (OPTICHAMBER ADVANTAGE) MISC 1 each by Other route once. Always uses her when you're using a metered-dose inhaler. You've aromatase medicine as much, he won't have his much side effect, but you it twice as much medicine and your lungs. 1 each 0  . temazepam (RESTORIL) 15 MG capsule TAKE 1 CAPSULE BY MOUTH AT BEDTIME AS NEEDED FOR SLEEP 30 capsule 5  . vitamin  B-12 (CYANOCOBALAMIN) 1000 MCG tablet Take 1,000 mcg by mouth daily.     No current facility-administered medications for this visit.     Allergies:   Ace inhibitors; Benicar [olmesartan medoxomil]; Clarithromycin; Clarithromycin; Norvasc [amlodipine besylate]; Levofloxacin; and Zithromax [azithromycin dihydrate]    Social History:  The patient  reports that she has never smoked. She has never used smokeless tobacco. She reports that she does not drink alcohol or use drugs.   Family History:  The patient's family history includes Cancer in her daughter; Heart disease in her father; Hypertension in her mother; Kidney cancer in her father; Kidney disease in her daughter and mother; Kidney failure in her mother; Multiple myeloma in her daughter; Rheumatologic disease in her father.    ROS:  Please see the history of present illness.   Otherwise, review of systems are positive  for none.   All other systems are reviewed and negative.    PHYSICAL EXAM: VS:  BP (!) 150/62 (BP Location: Left Arm, Patient Position: Sitting, Cuff Size: Normal)   Pulse 69   Ht 5' (1.524 m)   Wt 142 lb 12 oz (64.8 kg)   BMI 27.88 kg/m  , BMI Body mass index is 27.88 kg/m. GEN: Well nourished, well developed, in no acute distress  HEENT: normal  Neck: no JVD, carotid bruits, or masses Cardiac: Regular rate and rhythm; no murmurs, rubs, or gallops, trace bilateral edema  Respiratory:  clear to auscultation bilaterally, normal work of breathing GI: soft, nontender, nondistended, + BS MS: no deformity or atrophy  Skin: warm and dry, no rash Neuro:  Strength and sensation are intact Psych: euthymic mood, full affect   EKG:  EKG is ordered today. The ekg ordered today demonstrates sinus rhythm with PVCs and nonspecific ST changes.   Recent Labs: 08/14/2017: ALT 9; Pro B Natriuretic peptide (BNP) 473.0 09/20/2017: Hemoglobin 9.2; Platelets 222 09/21/2017: Magnesium 1.7 09/27/2017: BUN 69; Creatinine 2.2;  Potassium 3.9; Sodium 140    Lipid Panel    Component Value Date/Time   CHOL 124 06/16/2016 0945   TRIG 87.0 06/16/2016 0945   HDL 42.50 06/16/2016 0945   CHOLHDL 3 06/16/2016 0945   VLDL 17.4 06/16/2016 0945   LDLCALC 64 06/16/2016 0945      Wt Readings from Last 3 Encounters:  07/03/18 142 lb 12 oz (64.8 kg)  05/01/18 144 lb 3.2 oz (65.4 kg)  03/08/18 139 lb 12.8 oz (63.4 kg)      PAD Screen 01/26/2017  Previous PAD dx? No  Previous surgical procedure? No  Pain with walking? No  Feet/toe relief with dangling? No  Painful, non-healing ulcers? No  Extremities discolored? No      ASSESSMENT AND PLAN:  1.  Persistent atrial fibrillation: She remains in sinus rhythm.  She is not on any rate controlling medications due to symptomatic bradycardia while she was on small dose metoprolol.   Continue long-term anticoagulation with low-dose Eliquis.  She had labs done with nephrology recently which were stable.  2. Chronic diastolic heart failure with moderate pulmonary hypertension: She appears to be euvolemic on current dose of furosemide.  3. Coronary artery disease involving native coronary arteries without angina:  Nuclear stress test showed evidence of prior inferior and inferolateral infarct with minimal ischemia.  Given her age, advanced chronic kidney disease and lack of anginal symptoms, she is being treated medically.  4. Essential hypertension: Blood pressure is mildly elevated but has been running in the normal range at rehab.  Continue to monitor for now.  5. Chronic kidney disease: Stable and followed by nephrology.   Disposition:   FU with me in 6 months  Signed,  Kathlyn Sacramento, MD  07/03/2018 4:20 PM    [facility]

## 2018-07-03 NOTE — Patient Instructions (Signed)
Medication Instructions:  Your physician recommends that you continue on your current medications as directed. Please refer to the Current Medication list given to you today. Eliquis Sample provided today 3 boxes: Lot # [id number] / Exp 02/2019 1 box: Lot # [id number]/ Exp 04/2019  If you need a refill on your cardiac medications before your next appointment, please call your pharmacy.   Lab work: None today   If you have labs (blood work) drawn today and your tests are completely normal, you will receive your results only by: Marland Kitchen MyChart Message (if you have MyChart) OR . A paper copy in the mail If you have any lab test that is abnormal or we need to change your treatment, we will call you to review the results.  Testing/Procedures: None ordered   Follow-Up: At [facility], you and your health needs are our priority.  As part of our continuing mission to provide you with exceptional heart care, we have created designated Provider Care Teams.  These Care Teams include your primary Cardiologist (physician) and Advanced Practice Providers (APPs -  Physician Assistants and Nurse Practitioners) who all work together to provide you with the care you need, when you need it. You will need a follow up appointment in 6 months.  Please call our office 2 months in advance to schedule this appointment.  You may see Dr. Fletcher Anon or one of the following Advanced Practice Providers on your designated Care Team:   Murray Hodgkins, NP Christell Faith, PA-C . Marrianne Mood, PA-C

## 2018-07-04 DIAGNOSIS — R0609 Other forms of dyspnea: Principal | ICD-10-CM

## 2018-07-04 NOTE — Addendum Note (Signed)
Addended by: Anselm Pancoast on: 07/04/2018 11:28 AM   Modules accepted: Orders

## 2018-07-04 NOTE — Progress Notes (Signed)
Daily Session Note  Patient Details  Name: Savannah Wilson MRN: [id number] Date of Birth: [birth year] Referring Provider:     Pulmonary Rehab from 03/06/2018 in [facility] Cardiac and Pulmonary Rehab  Referring Provider  [name]      Encounter Date: 07/04/2018  Check In: Session Check In - 07/04/18 1139      Check-In   Supervising physician immediately available to respond to emergencies  [facility] immediately available ER MD    Physician(s)  Dr. Quentin Cornwall and Darl Householder    Location  [facility]-Cardiac & Pulmonary Rehab    Staff Present  Justin Mend RCP,RRT,BSRT;Meredith Sherryll Burger, RN BSN;Laureen Owens Shark, BS, RRT, Respiratory Therapist    Medication changes reported      No    Fall or balance concerns reported     No    Warm-up and Cool-down  Performed as group-led instruction    Resistance Training Performed  Yes    VAD Patient?  No    PAD/SET Patient?  No      Pain Assessment   Currently in Pain?  No/denies          Social History   Tobacco Use  Smoking Status Never Smoker  Smokeless Tobacco Never Used    Goals Met:  Independence with exercise equipment Exercise tolerated well No report of cardiac concerns or symptoms Strength training completed today  Goals Unmet:  Not Applicable  Comments: Pt able to follow exercise prescription today without complaint.  Will continue to monitor for progression.    Dr. Emily Filbert is Medical Director for [facility] and [facility].

## 2018-07-09 ENCOUNTER — Encounter: Payer: PPO | Attending: Internal Medicine | Admitting: *Deleted

## 2018-07-09 DIAGNOSIS — K219 Gastro-esophageal reflux disease without esophagitis: Secondary | ICD-10-CM | POA: Insufficient documentation

## 2018-07-09 DIAGNOSIS — I129 Hypertensive chronic kidney disease with stage 1 through stage 4 chronic kidney disease, or unspecified chronic kidney disease: Secondary | ICD-10-CM | POA: Diagnosis not present

## 2018-07-09 DIAGNOSIS — Z79890 Hormone replacement therapy (postmenopausal): Secondary | ICD-10-CM | POA: Insufficient documentation

## 2018-07-09 DIAGNOSIS — Z79899 Other long term (current) drug therapy: Secondary | ICD-10-CM | POA: Insufficient documentation

## 2018-07-09 DIAGNOSIS — R0609 Other forms of dyspnea: Secondary | ICD-10-CM | POA: Diagnosis not present

## 2018-07-09 DIAGNOSIS — N184 Chronic kidney disease, stage 4 (severe): Secondary | ICD-10-CM | POA: Insufficient documentation

## 2018-07-09 DIAGNOSIS — E785 Hyperlipidemia, unspecified: Secondary | ICD-10-CM | POA: Insufficient documentation

## 2018-07-09 NOTE — Progress Notes (Signed)
Daily Session Note  Patient Details  Name: Savannah Wilson MRN: [id number] Date of Birth: [birth year] Referring Provider:     Pulmonary Rehab from 03/06/2018 in [facility] Cardiac and Pulmonary Rehab  Referring Provider  Derrel Nip      Encounter Date: 07/09/2018  Check In: Session Check In - 07/09/18 1216      Check-In   Supervising physician immediately available to respond to emergencies  [facility] immediately available ER MD    Physician(s)  Dr. Jimmye Norman and Joni Fears    Location  [facility]-Cardiac & Pulmonary Rehab    Staff Present  Earlean Shawl, BS, ACSM CEP, Exercise Physiologist;Joseph [facility] [place], ACSM CEP, Exercise Physiologist    Medication changes reported      No    Fall or balance concerns reported     No    Tobacco Cessation  No Change    Warm-up and Cool-down  Performed as group-led instruction    Resistance Training Performed  Yes    VAD Patient?  No    PAD/SET Patient?  No      Pain Assessment   Currently in Pain?  No/denies    Multiple Pain Sites  No          Social History   Tobacco Use  Smoking Status Never Smoker  Smokeless Tobacco Never Used    Goals Met:  Proper associated with RPD/PD & O2 Sat Independence with exercise equipment Exercise tolerated well No report of cardiac concerns or symptoms Strength training completed today  Goals Unmet:  Not Applicable  Comments: Pt able to follow exercise prescription today without complaint.  Will continue to monitor for progression.    Dr. Emily Filbert is Medical Director for [facility] and [facility].

## 2018-07-10 ENCOUNTER — Ambulatory Visit (INDEPENDENT_AMBULATORY_CARE_PROVIDER_SITE_OTHER): Payer: PPO | Admitting: Internal Medicine

## 2018-07-10 ENCOUNTER — Encounter: Payer: Self-pay | Admitting: Internal Medicine

## 2018-07-10 VITALS — BP 112/62 | HR 74 | Resp 16 | Ht 60.0 in | Wt 144.0 lb

## 2018-07-10 DIAGNOSIS — J42 Unspecified chronic bronchitis: Secondary | ICD-10-CM | POA: Diagnosis not present

## 2018-07-10 NOTE — Progress Notes (Signed)
[facility]     Assessment and Plan:  Chronic bronchitis/COPD. - Chronic mucus production due to chronic bronchitis. Continue budesonide nebulizer. --Has dyspnea on exertion, appears improved with pulm rehab.  -Currently the patient notes that she has jitteriness with albuterol nebulizer, she has expressed difficulty in taking long-acting inhalers due to the cost.  Therefore she is being maintained on budesonide nebulizer with pro-air as needed.   Date: 07/10/2018  MRN# [id number] NYEMA HACHEY 06-26-1929    [name] is a 84 y.o. old female seen in consultation for chief complaint of:    Chief Complaint  Patient presents with  . Bronchitis    pt states sob has improved with pulm rehab.  . Cough    with clear mucus she has Tessalon perles and [employer], she uses nebs bid.    HPI:  The patient is a 84 year old female with COPD, diastolic congestive heart failure, essential hypertension.  Last visit it was noted that she had symptoms of excess mucus production with chronic bronchitis.  She had been on DuoNeb which was making her feel jittery, therefore switch to budesonide nebulizer twice daily, and pro-air as needed.  She has been doing well, she is going to pulmonary rehab and feels that it is helping, her breathing has improved. She is using her neb bid, she takes proair as needed, usually once or twice per day.    Previous O.V. 11/05/2010 ov cc recurrent bronchitis x sev decades, Assoc with difficulty breathing and transient need for inhalers then Dec 2011 on trip to [facility] by bus developed persistent doe at > slow adls so started on advair and proaire with only a little improvement, Assoc with hoarseness, mostly dry cough. Has chronic leg swelling no correlation with cough. proaire works best but hfa < 10% in terms of technique . Mild assoc nasal congestion, sense of pnd but no sign sputum production or rhinorhea. rec change advair to dulera 100 And change  metaprolol to bystolic in case of spillover B2 effect, work on inhaler technique  12/03/2010 ov/Wert cc "not better at all" x 2 weeks p ov then ha then checked bp 239/101 then [facility] where bystolic stopped restart the metaprolol and add back lasix and breathing got better but still not back to baseline but now able to ex for 20 min. No more cough.  rec trial of dulera 100 and max gerd diet/ ppi, ok to leave on metaprolol   01/07/2011 ov/Wert all smiles  Cc only sob with steps,  Sleeping ok without nocturnal  or early am exac of resp c/o's or need for noct saba.  No cough. Pt denies any significant sore throat, dysphagia, itching, sneezing,  nasal congestion or excess/ purulent secretions,  fever, chills, sweats, unintended wt loss, pleuritic or exertional cp, hempoptysis, orthopnea pnd or leg swelling.     **Imaging personally reviewed, chest x-ray 09/11/2017, mild hyperinflation consistent with emphysema, small right pleural effusion.  Medication:    Current Outpatient Medications:  .  acetaminophen (TYLENOL) 650 MG CR tablet, Take 650 mg by mouth every 8 (eight) hours as needed for pain. , Disp: , Rfl:  .  ALPRAZolam (XANAX) 0.5 MG tablet, Take 1 tablet (0.5 mg total) by mouth 2 (two) times daily as needed for anxiety., Disp: 45 tablet, Rfl: 5 .  apixaban (ELIQUIS) 2.5 MG TABS tablet, Take 1 tablet (2.5 mg total) by mouth 2 (two) times daily., Disp: 180 tablet, Rfl: 3 .  benzonatate (TESSALON) 200  MG capsule, Take 1 capsule (200 mg total) by mouth 3 (three) times daily as needed for cough., Disp: 60 capsule, Rfl: 1 .  budesonide (PULMICORT) 0.25 MG/2ML nebulizer solution, Take 2 mLs (0.25 mg total) by nebulization 2 (two) times daily., Disp: 60 mL, Rfl: 12 .  cetirizine (ZYRTEC ALLERGY) 10 MG tablet, Take 10 mg by mouth daily., Disp: , Rfl:  .  Cholecalciferol (VITAMIN D-3) 1000 units CAPS, Take 1,000 Units by mouth daily. , Disp: , Rfl:  .  escitalopram (LEXAPRO) 10 MG tablet, Take 1 tablet (10  mg total) by mouth daily. After dinner, Disp: 90 tablet, Rfl: 1 .  fluticasone (FLONASE) 50 MCG/ACT nasal spray, Place 2 sprays into the nose daily. (Patient taking differently: Place 2 sprays into the nose daily as needed (For allergies.). ), Disp: 16 g, Rfl: 6 .  furosemide (LASIX) 40 MG tablet, Take 40 mg by mouth daily., Disp: , Rfl:  .  hydrALAZINE (APRESOLINE) 50 MG tablet, TAKE ONE TABLET BY MOUTH 3 TIMES DAILY, Disp: 90 tablet, Rfl: 1 .  ipratropium-albuterol (DUONEB) 0.5-2.5 (3) MG/3ML SOLN, Take 3 mLs by nebulization every 6 (six) hours as needed., Disp: 360 mL, Rfl: 0 .  levothyroxine (SYNTHROID, LEVOTHROID) 100 MCG tablet, TAKE ONE TABLET BY MOUTH EVERY DAY FOR THYROID, Disp: 90 tablet, Rfl: 0 .  losartan (COZAAR) 50 MG tablet, Take 1 tablet (50 mg total) by mouth daily., Disp: 90 tablet, Rfl: 0 .  mupirocin ointment (BACTROBAN) 2 %, Place 1 application into the nose 2 (two) times daily., Disp: 22 g, Rfl: 0 .  omeprazole (PRILOSEC) 40 MG capsule, TAKE ONE CAPSULE BY MOUTH TWICE A DAY. BEFORE BREAKFAST AND SUPPER, Disp: 180 capsule, Rfl: 1 .  PROAIR HFA 108 (90 BASE) MCG/ACT inhaler, INHALE 2 PUFFS EVERY 6 HOURS AS NEEDED FOR WHEEZING, Disp: 8.5 g, Rfl: 3 .  rOPINIRole (REQUIP) 0.25 MG tablet, Take 1 tablet (0.25 mg total) by mouth 3 (three) times daily., Disp: 90 tablet, Rfl: 0 .  Spacer/Aero-Holding Chambers (OPTICHAMBER ADVANTAGE) MISC, 1 each by Other route once. Always uses her when you're using a metered-dose inhaler. You've aromatase medicine as much, he won't have his much side effect, but you it twice as much medicine and your lungs., Disp: 1 each, Rfl: 0 .  temazepam (RESTORIL) 15 MG capsule, TAKE 1 CAPSULE BY MOUTH AT BEDTIME AS NEEDED FOR SLEEP, Disp: 30 capsule, Rfl: 5 .  vitamin B-12 (CYANOCOBALAMIN) 1000 MCG tablet, Take 1,000 mcg by mouth daily., Disp: , Rfl:    Allergies:  Ace inhibitors; Benicar [olmesartan medoxomil]; Clarithromycin; Clarithromycin; Norvasc [amlodipine  besylate]; Levofloxacin; and Zithromax [azithromycin dihydrate]    Review of Systems:  Constitutional: Feels well. Cardiovascular: Denies chest pain, exertional chest pain.  Pulmonary: Denies hemoptysis, pleuritic chest pain.   The remainder of systems were reviewed and were found to be negative other than what is documented in the HPI.    Physical Examination:   VS: BP 112/62 (BP Location: Left Arm, Cuff Size: Normal)   Pulse 74   Resp 16   Ht 5' (1.524 m)   Wt 144 lb (65.3 kg)   SpO2 95%   BMI 28.12 kg/m   General Appearance: No distress  Neuro:without focal findings, mental status, speech normal, alert and oriented HEENT: PERRLA, EOM intact Pulmonary: No wheezing, No rales  CardiovascularNormal S1,S2.  No m/r/g.  Abdomen: Benign, Soft, non-tender, No masses Renal:  No costovertebral tenderness  GU:  No performed at this time. Endoc: No evident  thyromegaly, no signs of acromegaly or Cushing features Skin:   warm, no rashes, no ecchymosis  Extremities: normal, no cyanosis, clubbing.    LABORATORY PANEL:   CBC No results for input(s): WBC, HGB, HCT, PLT in the last 168 hours. ------------------------------------------------------------------------------------------------------------------  Chemistries  No results for input(s): NA, K, CL, CO2, GLUCOSE, BUN, CREATININE, CALCIUM, MG, AST, ALT, ALKPHOS, BILITOT in the last 168 hours.  Invalid input(s): GFRCGP ------------------------------------------------------------------------------------------------------------------  Cardiac Enzymes No results for input(s): TROPONINI in the last 168 hours. ------------------------------------------------------------  RADIOLOGY:  No results found.     Thank  you for the consultation and for allowing [facility] to assist in the care of your patient. Our recommendations are noted above.  Please contact us if we can be of further service.  Marda Stalker, M.D., F.C.C.P.  Board Certified in Internal Medicine, Pulmonary Medicine, [place], and Sleep Medicine.  [facility] Office Number: [phone]   07/10/2018

## 2018-07-10 NOTE — Patient Instructions (Signed)
Continue duo nebs. 

## 2018-07-11 ENCOUNTER — Encounter: Payer: PPO | Admitting: *Deleted

## 2018-07-11 VITALS — Ht 60.0 in | Wt 142.0 lb

## 2018-07-11 DIAGNOSIS — R0609 Other forms of dyspnea: Principal | ICD-10-CM

## 2018-07-11 NOTE — Progress Notes (Signed)
Daily Session Note  Patient Details  Name: Savannah Wilson MRN: [id number] Date of Birth: May 27, 1929 Referring Provider:     Pulmonary Rehab from 03/06/2018 in [facility] Re[facility]  Referring Provider  Derrel Nip      Encounter Date: 07/11/2018  Check In: Session Check In - 07/11/18 1141      Check-In   Supervising physician immediately available to respond to emergencies  [facility] immediately available ER MD    Physician(s)  Dr. Quentin Cornwall and Jimmye Norman    Location  [facility]-Cardiac & Pulmonary Rehab    Staff Present  Renita Papa, RN BSN;Jessica Luan Pulling, MA, RCEP, CCRP, Exercise Physiologist;Joseph Tessie Fass RCP,RRT,BSRT    Medication changes reported      No    Fall or balance concerns reported     No    Tobacco Cessation  No Change    Warm-up and Cool-down  Performed as group-led instruction    Resistance Training Performed  Yes    VAD Patient?  No    PAD/SET Patient?  No      Pain Assessment   Currently in Pain?  No/denies          Social History   Tobacco Use  Smoking Status Never Smoker  Smokeless Tobacco Never Used    Goals Met:  Proper associated with RPD/PD & O2 Sat Independence with exercise equipment Using PLB without cueing & demonstrates good technique Exercise tolerated well No report of cardiac concerns or symptoms Strength training completed today  Goals Unmet:  Not Applicable  Comments: Pt able to follow exercise prescription today without complaint.  Will continue to monitor for progression. [place] Name 03/06/18 1219 07/11/18 1244       6 Minute Walk   Phase  -  Discharge    Distance  610 feet  815 feet    Distance % Change  -  33.6 %    Distance Feet Change  -  205 ft    Walk Time  5.6 minutes  6 minutes    # of Rest Breaks  1  0    MPH  1.23  1.54    METS  0.6  1.34    RPE  13  13    Perceived Dyspnea   3  1    VO2 Peak  2.1  4.69    Symptoms  No  No    Resting HR  67 bpm  67 bpm    Resting BP  138/68  122/60    Resting Oxygen Saturation   97 %  95 %    Exercise Oxygen Saturation  during 6 min walk  95 %  93 %    Max Ex. HR  92 bpm  114 bpm    Max Ex. BP  138/60  158/72    2 Minute Post BP  130/64  136/74      Interval HR   1 Minute HR  86  90    2 Minute HR  89  92    3 Minute HR  92  94    4 Minute HR  84  101    5 Minute HR  88  114    6 Minute HR  88  102    2 Minute Post HR  64  72    Interval Heart Rate?  Yes  Yes      Interval Oxygen   Interval Oxygen?  Yes  Yes  Baseline Oxygen Saturation %  97 %  95 %    1 Minute Oxygen Saturation %  95 %  94 %    1 Minute Liters of Oxygen  0 L  0 L Room Air    2 Minute Oxygen Saturation %  95 %  94 %    2 Minute Liters of Oxygen  0 L  0 L    3 Minute Oxygen Saturation %  96 %  95 %    3 Minute Liters of Oxygen  0 L  0 L    4 Minute Oxygen Saturation %  96 %  94 %    4 Minute Liters of Oxygen  0 L  0 L    5 Minute Oxygen Saturation %  95 %  93 %    5 Minute Liters of Oxygen  0 L  0 L    6 Minute Oxygen Saturation %  96 %  93 %    6 Minute Liters of Oxygen  0 L  0 L    2 Minute Post Oxygen Saturation %  97 %  96 %    2 Minute Post Liters of Oxygen  0 L  0 L         Dr. Emily Filbert is Medical Director for [facility] and [facility].

## 2018-07-16 ENCOUNTER — Encounter: Payer: PPO | Admitting: *Deleted

## 2018-07-16 DIAGNOSIS — R0609 Other forms of dyspnea: Principal | ICD-10-CM

## 2018-07-16 NOTE — Progress Notes (Signed)
Daily Session Note  Patient Details  Name: Savannah Wilson MRN: [id number] Date of Birth: 1929-06-30 Referring Provider:     Pulmonary Rehab from 03/06/2018 in [facility] Cardiac and Pulmonary Rehab  Referring Provider  Derrel Nip      Encounter Date: 07/16/2018  Check In: Session Check In - 07/16/18 1225      Check-In   Supervising physician immediately available to respond to emergencies  [facility] immediately available ER MD    Physician(s)  Dr. Kerman Passey and Dr. Quentin Cornwall    Location  [facility]-Cardiac & Pulmonary Rehab    Staff Present  Carson Myrtle, BS, RRT, Respiratory Bertis Ruddy, BS, ACSM CEP, Exercise Physiologist;Joseph Tessie Fass RCP,RRT,BSRT    Medication changes reported      No    Fall or balance concerns reported     No    Tobacco Cessation  No Change    Warm-up and Cool-down  Performed as group-led instruction    Resistance Training Performed  Yes    VAD Patient?  No    PAD/SET Patient?  No      Pain Assessment   Currently in Pain?  No/denies    Multiple Pain Sites  No          Social History   Tobacco Use  Smoking Status Never Smoker  Smokeless Tobacco Never Used    Goals Met:  Proper associated with RPD/PD & O2 Sat Independence with exercise equipment Exercise tolerated well No report of cardiac concerns or symptoms Strength training completed today  Goals Unmet:  Not Applicable  Comments: Pt able to follow exercise prescription today without complaint.  Will continue to monitor for progression.    Dr. Emily Filbert is Medical Director for [facility] and [facility].

## 2018-07-18 ENCOUNTER — Other Ambulatory Visit: Payer: Self-pay | Admitting: Cardiovascular Disease

## 2018-07-18 ENCOUNTER — Encounter: Payer: PPO | Admitting: *Deleted

## 2018-07-18 ENCOUNTER — Encounter: Payer: Self-pay | Admitting: Internal Medicine

## 2018-07-18 DIAGNOSIS — R0609 Other forms of dyspnea: Secondary | ICD-10-CM | POA: Diagnosis not present

## 2018-07-18 NOTE — Progress Notes (Signed)
Daily Session Note  Patient Details  Name: Savannah Wilson MRN: [id number] Date of Birth: Jan 11, 1929 Referring Provider:     Pulmonary Rehab from 03/06/2018 in [facility] Cardiac and Pulmonary Rehab  Referring Provider  Derrel Nip      Encounter Date: 07/18/2018  Check In: Session Check In - 07/18/18 1131      Check-In   Supervising physician immediately available to respond to emergencies  [facility] immediately available ER MD    Physician(s)  Drs. Alyse Low    Location  [facility]-Cardiac & Pulmonary Rehab    Staff Present  Alberteen Sam, MA, RCEP, CCRP, Exercise Physiologist;Joseph Foy [place], [place], ACSM CEP, Exercise Physiologist    Medication changes reported      No    Fall or balance concerns reported     No    Warm-up and Cool-down  Performed as group-led instruction    Resistance Training Performed  Yes    VAD Patient?  No    PAD/SET Patient?  No      Pain Assessment   Currently in Pain?  No/denies          Social History   Tobacco Use  Smoking Status Never Smoker  Smokeless Tobacco Never Used    Goals Met:  Proper associated with RPD/PD & O2 Sat Independence with exercise equipment Using PLB without cueing & demonstrates good technique Exercise tolerated well No report of cardiac concerns or symptoms Strength training completed today  Goals Unmet:  Not Applicable  Comments: Pt able to follow exercise prescription today without complaint.  Will continue to monitor for progression.    Dr. Emily Filbert is Medical Director for [facility] and [facility].

## 2018-07-23 DIAGNOSIS — R0609 Other forms of dyspnea: Principal | ICD-10-CM

## 2018-07-23 NOTE — Progress Notes (Signed)
Pulmonary Individual Treatment Plan  Patient Details  Name: Savannah Wilson MRN: [id number] Date of Birth: 1928-10-25 Referring Provider:     Pulmonary Rehab from 03/06/2018 in [facility]  Referring Provider  [name]      Initial Encounter Date:    Pulmonary Rehab from 03/06/2018 in [facility] Re[facility]  Date  03/06/18      Visit Diagnosis: Dyspnea on exertion  Patient's Home Medications on Admission:  Current Outpatient Medications:  .  acetaminophen (TYLENOL) 650 MG CR tablet, Take 650 mg by mouth every 8 (eight) hours as needed for pain. , Disp: , Rfl:  .  ALPRAZolam (XANAX) 0.5 MG tablet, Take 1 tablet (0.5 mg total) by mouth 2 (two) times daily as needed for anxiety., Disp: 45 tablet, Rfl: 5 .  apixaban (ELIQUIS) 2.5 MG TABS tablet, Take 1 tablet (2.5 mg total) by mouth 2 (two) times daily., Disp: 180 tablet, Rfl: 3 .  benzonatate (TESSALON) 200 MG capsule, Take 1 capsule (200 mg total) by mouth 3 (three) times daily as needed for cough., Disp: 60 capsule, Rfl: 1 .  budesonide (PULMICORT) 0.25 MG/2ML nebulizer solution, Take 2 mLs (0.25 mg total) by nebulization 2 (two) times daily., Disp: 60 mL, Rfl: 12 .  cetirizine (ZYRTEC ALLERGY) 10 MG tablet, Take 10 mg by mouth daily., Disp: , Rfl:  .  Cholecalciferol (VITAMIN D-3) 1000 units CAPS, Take 1,000 Units by mouth daily. , Disp: , Rfl:  .  escitalopram (LEXAPRO) 10 MG tablet, Take 1 tablet (10 mg total) by mouth daily. After dinner, Disp: 90 tablet, Rfl: 1 .  fluticasone (FLONASE) 50 MCG/ACT nasal spray, Place 2 sprays into the nose daily. (Patient taking differently: Place 2 sprays into the nose daily as needed (For allergies.). ), Disp: 16 g, Rfl: 6 .  furosemide (LASIX) 20 MG tablet, TAKE ONE TABLET EVERY DAY, Disp: 90 tablet, Rfl: 2 .  furosemide (LASIX) 40 MG tablet, Take 40 mg by mouth daily., Disp: , Rfl:  .  hydrALAZINE (APRESOLINE) 50 MG tablet, TAKE ONE TABLET BY MOUTH 3 TIMES DAILY, Disp: 90  tablet, Rfl: 1 .  ipratropium-albuterol (DUONEB) 0.5-2.5 (3) MG/3ML SOLN, Take 3 mLs by nebulization every 6 (six) hours as needed., Disp: 360 mL, Rfl: 0 .  levothyroxine (SYNTHROID, LEVOTHROID) 100 MCG tablet, TAKE ONE TABLET BY MOUTH EVERY DAY FOR THYROID, Disp: 90 tablet, Rfl: 0 .  losartan (COZAAR) 50 MG tablet, Take 1 tablet (50 mg total) by mouth daily., Disp: 90 tablet, Rfl: 0 .  mupirocin ointment (BACTROBAN) 2 %, Place 1 application into the nose 2 (two) times daily., Disp: 22 g, Rfl: 0 .  omeprazole (PRILOSEC) 40 MG capsule, TAKE ONE CAPSULE BY MOUTH TWICE A DAY. BEFORE BREAKFAST AND SUPPER, Disp: 180 capsule, Rfl: 1 .  PROAIR HFA 108 (90 BASE) MCG/ACT inhaler, INHALE 2 PUFFS EVERY 6 HOURS AS NEEDED FOR WHEEZING, Disp: 8.5 g, Rfl: 3 .  rOPINIRole (REQUIP) 0.25 MG tablet, Take 1 tablet (0.25 mg total) by mouth 3 (three) times daily., Disp: 90 tablet, Rfl: 0 .  Spacer/Aero-Holding Chambers (OPTICHAMBER ADVANTAGE) MISC, 1 each by Other route once. Always uses her when you're using a metered-dose inhaler. You've aromatase medicine as much, he won't have his much side effect, but you it twice as much medicine and your lungs., Disp: 1 each, Rfl: 0 .  temazepam (RESTORIL) 15 MG capsule, TAKE 1 CAPSULE BY MOUTH AT BEDTIME AS NEEDED FOR SLEEP, Disp: 30 capsule, Rfl: 5 .  vitamin B-12 (CYANOCOBALAMIN) 1000 MCG tablet, Take 1,000 mcg by mouth daily., Disp: , Rfl:   Past Medical History: Past Medical History:  Diagnosis Date  . Asthma   . Barrett esophagus   . Chronic bronchitis   . Chronic kidney disease (CKD), stage IV (severe) ([place])   . Depression   . Diverticulosis   . GERD (gastroesophageal reflux disease)   . Gout   . Hyperlipidemia   . Hypertension   . Hypothyroid   . Mitral regurgitation    a. TTE 02/2017: EF 55-60%, normal WM, calcified mitral annulus with mod regurg, moderately dilated LA, unable to estimate PASP  . Pancreatitis due to biliary obstruction March 2010   s/p ERCP  sphincterotomy, cholecystectomy  . Peripheral neuralgia   . Persistent atrial fibrillation    a. diagnosed 01/24/17; CHADS2VASc => 5 (HTN, age x 2, vascular disease with PAD and aortic plaque, female) giving her an estimated annual stroke risk of 6.7%; b. successful DCCV 03/28/2017; c. on Eliquis  . Venous insufficiency     Tobacco Use: Social History   Tobacco Use  Smoking Status Never Smoker  Smokeless Tobacco Never Used    Labs: Recent Review Flowsheet Data    Labs for ITP Cardiac and Pulmonary Rehab Latest Ref Rng & Units 10/28/2013 06/16/2016   Cholestrol 0 - 200 mg/dL 116 124   LDLCALC 0 - 99 mg/dL 58 64   HDL >39.00 mg/dL 26.30(L) 42.50   Trlycerides 0.0 - 149.0 mg/dL 159.0(H) 87.0   Hemoglobin A1c 4.6 - 6.5 % - 5.0       Pulmonary Assessment Scores: Pulmonary Assessment Scores    Row Name 03/06/18 1112 07/11/18 1247 07/16/18 1511     ADL UCSD   ADL Phase  Entry  Exit  Exit   SOB Score total  55  -  37   Rest  0  -  0   Walk  2  -  1   Stairs  4  -  3   Bath  1  -  2   Dress  1  -  2   Shop  4  -  2     CAT Score   CAT Score  17  -  13     mMRC Score   mMRC Score  2  1  -      Pulmonary Function Assessment: Pulmonary Function Assessment - 03/06/18 1140      Breath   Bilateral Breath Sounds  Clear    Shortness of Breath  Yes;Limiting activity       Exercise Target Goals: Exercise Program Goal: Individual exercise prescription set using results from initial 6 min walk test and THRR while considering  patient's activity barriers and safety.   Exercise Prescription Goal: Initial exercise prescription builds to 30-45 minutes a day of aerobic activity, 2-3 days per week.  Home exercise guidelines will be given to patient during program as part of exercise prescription that the participant will acknowledge.  Activity Barriers & Risk Stratification:   6 Minute Walk: 6 Minute Walk    Row Name 03/06/18 1219 07/11/18 1244       6 Minute Walk   Phase   -  Discharge    Distance  610 feet  815 feet    Distance % Change  -  33.6 %    Distance Feet Change  -  205 ft    Walk Time  5.6 minutes  6 minutes    #  of Rest Breaks  1  0    MPH  1.23  1.54    METS  0.6  1.34    RPE  13  13    Perceived Dyspnea   3  1    VO2 Peak  2.1  4.69    Symptoms  No  No    Resting HR  67 bpm  67 bpm    Resting BP  138/68  122/60    Resting Oxygen Saturation   97 %  95 %    Exercise Oxygen Saturation  during 6 min walk  95 %  93 %    Max Ex. HR  92 bpm  114 bpm    Max Ex. BP  138/60  158/72    2 Minute Post BP  130/64  136/74      Interval HR   1 Minute HR  86  90    2 Minute HR  89  92    3 Minute HR  92  94    4 Minute HR  84  101    5 Minute HR  88  114    6 Minute HR  88  102    2 Minute Post HR  64  72    Interval Heart Rate?  Yes  Yes      Interval Oxygen   Interval Oxygen?  Yes  Yes    Baseline Oxygen Saturation %  97 %  95 %    1 Minute Oxygen Saturation %  95 %  94 %    1 Minute Liters of Oxygen  0 L  0 L Room Air    2 Minute Oxygen Saturation %  95 %  94 %    2 Minute Liters of Oxygen  0 L  0 L    3 Minute Oxygen Saturation %  96 %  95 %    3 Minute Liters of Oxygen  0 L  0 L    4 Minute Oxygen Saturation %  96 %  94 %    4 Minute Liters of Oxygen  0 L  0 L    5 Minute Oxygen Saturation %  95 %  93 %    5 Minute Liters of Oxygen  0 L  0 L    6 Minute Oxygen Saturation %  96 %  93 %    6 Minute Liters of Oxygen  0 L  0 L    2 Minute Post Oxygen Saturation %  97 %  96 %    2 Minute Post Liters of Oxygen  0 L  0 L      Oxygen Initial Assessment: Oxygen Initial Assessment - 03/06/18 1145      Home Oxygen   Home Oxygen Device  None    Sleep Oxygen Prescription  None    Home Exercise Oxygen Prescription  None    Home at Rest Exercise Oxygen Prescription  None      Initial 6 min Walk   Oxygen Used  None      Program Oxygen Prescription   Program Oxygen Prescription  None      Intervention   Short Term Goals  To learn and  demonstrate proper use of respiratory medications;To learn and understand importance of maintaining oxygen saturations>88%;To learn and demonstrate proper pursed lip breathing techniques or other breathing techniques.;To learn and understand importance of monitoring SPO2 with pulse oximeter and demonstrate accurate use of the  pulse oximeter.    Long  Term Goals  Verbalizes importance of monitoring SPO2 with pulse oximeter and return demonstration;Maintenance of O2 saturations>88%;Compliance with respiratory medication;Demonstrates proper use of MDI's;Exhibits proper breathing techniques, such as pursed lip breathing or other method taught during program session       Oxygen Re-Evaluation: Oxygen Re-Evaluation    Row Name 03/14/18 1126 04/11/18 1520 05/21/18 1553 06/22/18 1220 07/18/18 1437     Program Oxygen Prescription   Program Oxygen Prescription  None  None  None  None  None     Home Oxygen   Home Oxygen Device  None  None  None  None  None   Sleep Oxygen Prescription  None  None  None  None  None   Home Exercise Oxygen Prescription  None  None  None  None  None   Home at Rest Exercise Oxygen Prescription  None  None  None  None  None     Goals/Expected Outcomes   Short Term Goals  To learn and demonstrate proper use of respiratory medications;To learn and understand importance of maintaining oxygen saturations>88%;To learn and demonstrate proper pursed lip breathing techniques or other breathing techniques.;To learn and understand importance of monitoring SPO2 with pulse oximeter and demonstrate accurate use of the pulse oximeter.  To learn and demonstrate proper use of respiratory medications;To learn and understand importance of maintaining oxygen saturations>88%;To learn and demonstrate proper pursed lip breathing techniques or other breathing techniques.;To learn and understand importance of monitoring SPO2 with pulse oximeter and demonstrate accurate use of the pulse oximeter.  To  learn and demonstrate proper use of respiratory medications;To learn and understand importance of maintaining oxygen saturations>88%;To learn and demonstrate proper pursed lip breathing techniques or other breathing techniques.;To learn and understand importance of monitoring SPO2 with pulse oximeter and demonstrate accurate use of the pulse oximeter.  To learn and demonstrate proper use of respiratory medications;To learn and understand importance of maintaining oxygen saturations>88%;To learn and demonstrate proper pursed lip breathing techniques or other breathing techniques.;To learn and understand importance of monitoring SPO2 with pulse oximeter and demonstrate accurate use of the pulse oximeter.  To learn and demonstrate proper use of respiratory medications;To learn and understand importance of maintaining oxygen saturations>88%;To learn and demonstrate proper pursed lip breathing techniques or other breathing techniques.;To learn and understand importance of monitoring SPO2 with pulse oximeter and demonstrate accurate use of the pulse oximeter.   Long  Term Goals  Verbalizes importance of monitoring SPO2 with pulse oximeter and return demonstration;Maintenance of O2 saturations>88%;Compliance with respiratory medication;Demonstrates proper use of MDI's;Exhibits proper breathing techniques, such as pursed lip breathing or other method taught during program session  Verbalizes importance of monitoring SPO2 with pulse oximeter and return demonstration;Maintenance of O2 saturations>88%;Compliance with respiratory medication;Demonstrates proper use of MDI's;Exhibits proper breathing techniques, such as pursed lip breathing or other method taught during program session  Verbalizes importance of monitoring SPO2 with pulse oximeter and return demonstration;Maintenance of O2 saturations>88%;Compliance with respiratory medication;Demonstrates proper use of MDI's;Exhibits proper breathing techniques, such as  pursed lip breathing or other method taught during program session  Verbalizes importance of monitoring SPO2 with pulse oximeter and return demonstration;Maintenance of O2 saturations>88%;Compliance with respiratory medication;Demonstrates proper use of MDI's;Exhibits proper breathing techniques, such as pursed lip breathing or other method taught during program session  Verbalizes importance of monitoring SPO2 with pulse oximeter and return demonstration;Maintenance of O2 saturations>88%;Compliance with respiratory medication;Demonstrates proper use of MDI's;Exhibits proper breathing techniques, such as pursed lip breathing  or other method taught during program session   Comments  Reviewed PLB technique with pt.  Talked about how it work and it's important to maintaining his exercise saturations.    Patient checks her oxygen once in awhile when she is at home. Talked about the importance of checking her oxygen at home and making sure she is 88 percent or above.  Patient has been monitoring her oxygen at home whith exertion. She states she is in the high 80's to low 90's with exertion. She is walking slightly faster on the treadmill and does not require any oxyen.  Savannah Wilson needs to work on PLB more when she is exercising. She state that she is bad at using PLB when she is short of breath.  Patient has been working on her breathing when she is on the treadmill. Her shortness of breath is improving slightly. She has been enjoying the program and is close to graduating.  Patients oxygen has been good throughout the progam and has not required any. She is compliant with her medications. Savannah Wilson is going to join [employer] after Rehab to continue to exercise.   Goals/Expected Outcomes  Short: Become more profiecient at using PLB.   Long: Become independent at using PLB.  Short: monitor oxygen at home with exertion. Long: maintain oxygen saturations above 88 percent independently.  Short: work on PLB while on the treadmill.  Long: use PLB when short of breath independently.  Short: continue to work on PLB and attend regularly. Long: use PLB independently and graduate [employer].  Short: join [employer] to continue exercise. Long: mainain checking oxygen independently in [facility] [employer].      Oxygen Discharge (Final Oxygen Re-Evaluation): Oxygen Re-Evaluation - 07/18/18 1437      Program Oxygen Prescription   Program Oxygen Prescription  None      Home Oxygen   Home Oxygen Device  None    Sleep Oxygen Prescription  None    Home Exercise Oxygen Prescription  None    Home at Rest Exercise Oxygen Prescription  None      Goals/Expected Outcomes   Short Term Goals  To learn and demonstrate proper use of respiratory medications;To learn and understand importance of maintaining oxygen saturations>88%;To learn and demonstrate proper pursed lip breathing techniques or other breathing techniques.;To learn and understand importance of monitoring SPO2 with pulse oximeter and demonstrate accurate use of the pulse oximeter.    Long  Term Goals  Verbalizes importance of monitoring SPO2 with pulse oximeter and return demonstration;Maintenance of O2 saturations>88%;Compliance with respiratory medication;Demonstrates proper use of MDI's;Exhibits proper breathing techniques, such as pursed lip breathing or other method taught during program session    Comments  Patients oxygen has been good throughout the progam and has not required any. She is compliant with her medications. Savannah Wilson is going to join [employer] after Rehab to continue to exercise.    Goals/Expected Outcomes  Short: join [facility] [employer] to continue exercise. Long: mainain checking oxygen independently in [employer].       Initial Exercise Prescription: Initial Exercise Prescription - 03/06/18 1200      Date of Initial Exercise RX and Referring Provider   Date  03/06/18    Referring Provider  [name]      Treadmill   MPH  1    Grade  0    Minutes  14    METs  1.7       NuStep   Level  1    SPM  80    Minutes  15    METs  1.5      Biostep-RELP   Level  1    SPM  50    Minutes  15    METs  1.5      Prescription Details   Frequency (times per week)  2    Duration  Progress to 45 minutes of aerobic exercise without signs/symptoms of physical distress      Intensity   THRR 40-80% of Max Heartrate  94-121    Ratings of Perceived Exertion  11-13    Perceived Dyspnea  0-4      Resistance Training   Training Prescription  Yes    Weight  2 lb    Reps  10-15       Perform Capillary Blood Glucose checks as needed.  Exercise Prescription Changes: Exercise Prescription Changes    Row Name 03/20/18 1400 03/28/18 1200 04/02/18 1500 04/18/18 0800 05/02/18 1500     Response to Exercise   Blood Pressure (Admit)  124/66  -  116/60  130/82  124/82   Blood Pressure (Exercise)  138/68  -  -  -  -   Blood Pressure (Exit)  120/82  -  110/72  118/64  128/62   Heart Rate (Admit)  69 bpm  -  75 bpm  70 bpm  59 bpm   Heart Rate (Exercise)  90 bpm  -  82 bpm  90 bpm  122 bpm   Heart Rate (Exit)  68 bpm  -  66 bpm  67 bpm  62 bpm   Oxygen Saturation (Admit)  97 %  -  64 %  98 %  97 %   Oxygen Saturation (Exercise)  97 %  -  100 %  97 %  96 %   Oxygen Saturation (Exit)  100 %  -  98 %  99 %  99 %   Rating of Perceived Exertion (Exercise)  15  -  _0 Perceived Dyspnea (Exercise)  3  -  1  0  2   Symptoms  SOB and fatigue  -  SOB and fatigue  SOB and fatigue  SOB and fatigue   Comments  second full day of exercise  -  -  -  -   Duration  Progress to 45 minutes of aerobic exercise without signs/symptoms of physical distress  -  Continue with 45 min of aerobic exercise without signs/symptoms of physical distress.  Continue with 45 min of aerobic exercise without signs/symptoms of physical distress.  Continue with 45 min of aerobic exercise without signs/symptoms of physical distress.   Intensity  THRR unchanged  -  THRR unchanged  THRR unchanged  THRR  unchanged     Progression   Progression  Continue to progress workloads to maintain intensity without signs/symptoms of physical distress.  -  Continue to progress workloads to maintain intensity without signs/symptoms of physical distress.  Continue to progress workloads to maintain intensity without signs/symptoms of physical distress.  Continue to progress workloads to maintain intensity without signs/symptoms of physical distress.   Average METs  1.84  -  1.79  1.79  1.79     Resistance Training   Training Prescription  Yes  -  Yes  Yes  Yes   Weight  2 lbs  -  2 lbs  2 lbs  2 lbs   Reps  10-15  -  10-15  10-15  10-15     Interval Training   Interval Training  No  -  No  No  No     Treadmill   MPH  1  -  _0 Grade  0  -  0  0  0   Minutes  15  -  _1 METs  1.7  -  1.77  1.77  1.77     NuStep   Level  1  -  _2 Minutes  15  -  _3 METs  1.7  -  1.6  1.6  1.6     Biostep-RELP   Level  1  -  _4 Minutes  15  -  _5 METs  2  -  _6 Home Exercise Plan   Plans to continue exercise at  -  Home (comment) walking  Home (comment) walking  Home (comment) walking  Home (comment) walking   Frequency  -  Add 1 additional day to program exercise sessions.  Add 1 additional day to program exercise sessions.  Add 1 additional day to program exercise sessions.  Add 1 additional day to program exercise sessions.   Initial Home Exercises Provided  -  03/28/18  03/28/18  03/28/18  03/28/18   Row Name 05/15/18 0900 05/30/18 1400 06/12/18 1600 06/26/18 1600 07/10/18 1500     Response to Exercise   Blood Pressure (Admit)  104/60  106/80  120/78  120/80  124/66   Blood Pressure (Exit)  108/68  124/62  116/66  112/62  120/82   Heart Rate (Admit)  64 bpm  59 bpm  75 bpm  70 bpm  69 bpm   Heart Rate (Exercise)  71 bpm  102 bpm  67 bpm  95 bpm  90 bpm   Heart Rate (Exit)  84 bpm  58 bpm  59 bpm  91 bpm  68 bpm   Oxygen Saturation (Admit)  8 %  98  %  96 %  95 %  97 %   Oxygen Saturation (Exercise)  97 %  95 %  97 %  88 %  97 %   Oxygen Saturation (Exit)  98 %  98 %  97 %  95 %  100 %   Rating of Perceived Exertion (Exercise)  _7 Perceived Dyspnea (Exercise)  1  0  _8 Symptoms  SOB  none  none  none  none   Duration  Continue with 45 min of aerobic exercise without signs/symptoms of physical distress.  Continue with 45 min of aerobic exercise without signs/symptoms of physical distress.  Continue with 45 min of aerobic exercise without signs/symptoms of physical distress.  Continue with 45 min of aerobic exercise without signs/symptoms of physical distress.  Continue with 45 min of aerobic exercise without signs/symptoms of physical distress.   Intensity  THRR unchanged  THRR unchanged  THRR unchanged  THRR unchanged  THRR unchanged     Progression   Progression  Continue to progress workloads to maintain intensity without signs/symptoms of physical distress.  Continue to progress workloads to maintain intensity without signs/symptoms of physical distress.  Continue to progress workloads to  maintain intensity without signs/symptoms of physical distress.  Continue to progress workloads to maintain intensity without signs/symptoms of physical distress.  Continue to progress workloads to maintain intensity without signs/symptoms of physical distress.   Average METs  1.79  1.96  1.9  1.96  2.03     Resistance Training   Training Prescription  Yes  Yes  Yes  Yes  Yes   Weight  2 lbs  2 lbs  2 lbs  2 lbs  2 lbs   Reps  10-15  10-15  10-15  10-15  10-15     Interval Training   Interval Training  No  No  No  No  No     Treadmill   MPH  1  1.3  1.2  1.3  1.3   Grade  0  0.5  0.5  0.5  0.5   Minutes  '15  15  15  15  15   '$ METs  1.77  2.09  2  2.09  2.09     NuStep   Level  '1  3  3  4  4   '$ Minutes  '15  15  15  15  15   '$ METs  1.6  1.8  1.7  1.8  2     Biostep-RELP   Level  '1  3  3  3  3   '$ Minutes  '15  15  15  15   15   '$ METs  '2  2  2  2  2     '$ Home Exercise Plan   Plans to continue exercise at  Home (comment) walking  Home (comment) walking  Home (comment) walking  Home (comment) walking  Home (comment) walking   Frequency  Add 2 additional days to program exercise sessions.  Add 2 additional days to program exercise sessions.  Add 2 additional days to program exercise sessions.  Add 2 additional days to program exercise sessions.  Add 2 additional days to program exercise sessions.   Initial Home Exercises Provided  03/28/18  03/28/18  03/28/18  03/28/18  03/28/18      Exercise Comments: Exercise Comments    Row Name 03/14/18 1124           Exercise Comments  First full day of exercise!  Patient was oriented to gym and equipment including functions, settings, policies, and procedures.  Patient's individual exercise prescription and treatment plan were reviewed.  All starting workloads were established based on the results of the 6 minute walk test done at initial orientation visit.  The plan for exercise progression was also introduced and progression will be customized based on patient's performance and goals          Exercise Goals and Review: Exercise Goals    Row Name 03/06/18 1218             Exercise Goals   Increase Physical Activity  Yes       Intervention  Provide advice, education, support and counseling about physical activity/exercise needs.;Develop an individualized exercise prescription for aerobic and resistive training based on initial evaluation findings, risk stratification, comorbidities and participant's personal goals.       Expected Outcomes  Short Term: Attend rehab on a regular basis to increase amount of physical activity.;Long Term: Add in home exercise to make exercise part of routine and to increase amount of physical activity.;Long Term: Exercising regularly at least 3-5 days a week.       Increase  Strength and Stamina  Yes       Intervention  Provide advice,  education, support and counseling about physical activity/exercise needs.;Develop an individualized exercise prescription for aerobic and resistive training based on initial evaluation findings, risk stratification, comorbidities and participant's personal goals.       Expected Outcomes  Short Term: Increase workloads from initial exercise prescription for resistance, speed, and METs.;Short Term: Perform resistance training exercises routinely during rehab and add in resistance training at home;Long Term: Improve cardiorespiratory fitness, muscular endurance and strength as measured by increased METs and functional capacity (6MWT)       Able to understand and use rate of perceived exertion (RPE) scale  Yes       Intervention  Provide education and explanation on how to use RPE scale       Expected Outcomes  Short Term: Able to use RPE daily in rehab to express subjective intensity level;Long Term:  Able to use RPE to guide intensity level when exercising independently       Able to understand and use Dyspnea scale  Yes       Intervention  Provide education and explanation on how to use Dyspnea scale       Expected Outcomes  Short Term: Able to use Dyspnea scale daily in rehab to express subjective sense of shortness of breath during exertion;Long Term: Able to use Dyspnea scale to guide intensity level when exercising independently       Knowledge and understanding of Target Heart Rate Range (THRR)  Yes       Intervention  Provide education and explanation of THRR including how the numbers were predicted and where they are located for reference       Expected Outcomes  Short Term: Able to state/look up THRR;Short Term: Able to use daily as guideline for intensity in rehab;Long Term: Able to use THRR to govern intensity when exercising independently       Able to check pulse independently  Yes       Intervention  Provide education and demonstration on how to check pulse in carotid and radial  arteries.;Review the importance of being able to check your own pulse for safety during independent exercise       Expected Outcomes  Short Term: Able to explain why pulse checking is important during independent exercise;Long Term: Able to check pulse independently and accurately       Understanding of Exercise Prescription  Yes       Intervention  Provide education, explanation, and written materials on patient's individual exercise prescription       Expected Outcomes  Short Term: Able to explain program exercise prescription;Long Term: Able to explain home exercise prescription to exercise independently          Exercise Goals Re-Evaluation : Exercise Goals Re-Evaluation    Row Name 03/14/18 1126 03/20/18 1447 03/28/18 1212 04/02/18 1554 04/18/18 0821     Exercise Goal Re-Evaluation   Exercise Goals Review  Increase Physical Activity;Increase Strength and Stamina;Able to understand and use rate of perceived exertion (RPE) scale;Able to understand and use Dyspnea scale;Understanding of Exercise Prescription;Knowledge and understanding of Target Heart Rate Range (THRR)  Increase Physical Activity;Increase Strength and Stamina;Understanding of Exercise Prescription  Increase Physical Activity;Increase Strength and Stamina;Understanding of Exercise Prescription  Increase Physical Activity;Increase Strength and Stamina;Understanding of Exercise Prescription  Increase Physical Activity;Increase Strength and Stamina;Understanding of Exercise Prescription   Comments  Reviewed RPE scale, THR and program prescription with pt today.  Pt voiced understanding and was given a copy of goals to take home.   Savannah Wilson is off to a good start in rehab.  She has completed two full days of exercise.  All of her workloads are between 13-15 on the RPE scale.  We will continue to monitor her progress.   Reviewed home exercise with pt today.  Pt plans to walk at home and stores for exercise.  She is going to start by adding in  one extra day at home to get to three days a week.  Reviewed THR, pulse, RPE, sign and symptoms, and when to call 911 or MD.  Also discussed weather considerations and indoor options.  Pt voiced understanding.  Savannah Wilson has been doing well in rehab.  She is still at baseline workloads, but we will try to incease the NuStep for her. We will continue to monitor her progress.   Savannah Wilson continues to do well in rehab.  She is maintain 1.6 METs on the NuStep.  We will try to increase her workloads again.  We will continue to monitor her progression.    Expected Outcomes  Short: Use RPE daily to regulate intensity. Long: Follow program prescription in THR.  Short: Continue to attend rehab regularly.  Long: Continue to follow program prescription.  Short: Add in at least one extra day a week next week.  Long: Start to exercise independently  Short: Increase workload on NuStep.  Long: Continue to increase phsycial activity levels.   Short: Increase workloads.   Long: Continue to increase strength and stamina.    Savannah Wilson Name 05/02/18 1524 05/15/18 0945 05/30/18 1428 06/12/18 1611 06/26/18 1627     Exercise Goal Re-Evaluation   Exercise Goals Review  Increase Physical Activity;Increase Strength and Stamina;Understanding of Exercise Prescription  Increase Physical Activity;Increase Strength and Stamina;Understanding of Exercise Prescription  Increase Physical Activity;Increase Strength and Stamina;Understanding of Exercise Prescription  Increase Physical Activity;Increase Strength and Stamina;Understanding of Exercise Prescription  Increase Physical Activity;Increase Strength and Stamina;Understanding of Exercise Prescription   Comments  Savannah Wilson continues to do well in rehab. She is up to level 2 on the BioStep.  We will continue to monitor her progression.   Savannah Wilson has been doing well in rehab.   She is now on level 3 for the BioStep.  She continues to work hard in here for her.  We will continue to monitor her progress.   Savannah Wilson  continues to do well in rehab.  She is now up to 1.3 mph on the treadmill.  We will continue to monitor her progression.   Savannah Wilson continues to work in class. She dropped back down to 1.2 mph and we will work with her to get back up to 1.3 mph.  We will continue to monitor her progress.  Savannah Wilson has been doing well in rehab.  She is back up to her 1.3 mph again.  She is also doing level 4 on the NuStep.  We will continue to monitor her progression.    Expected Outcomes  Short: Increase workload on NuStep. Long: Continue to improve strength and stamina.   Short: Increase NuStep and treadmill.  Long: Continue to exercise at home on off days.   Short: Continue to move up workloads.  Long: Continue to encourage more activity at home.   Short: Get speed on treadmill back up to 1.3 mph.  Long: Continue to move more at home.   Short: Increase BioStep to level 4.  Long: Continue to increase her physical activity.  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  I expect to see her improve some.  She is planning to continue to exercise by walking at home.  We will continue to monitor her progress.        Expected Outcomes  Short: Improve post 6MWT.  Long: Continue to exercise independently.           Discharge Exercise Prescription (Final Exercise Prescription Changes): Exercise Prescription Changes - 07/10/18 1500      Response to Exercise   Blood Pressure (Admit)  124/66    Blood Pressure (Exit)  120/82    Heart Rate (Admit)  69 bpm    Heart Rate (Exercise)  90 bpm    Heart Rate (Exit)  68 bpm    Oxygen Saturation (Admit)  97 %    Oxygen Saturation (Exercise)  97 %    Oxygen Saturation (Exit)  100 %    Rating of Perceived Exertion (Exercise)  15    Perceived Dyspnea (Exercise)  1     Symptoms  none    Duration  Continue with 45 min of aerobic exercise without signs/symptoms of physical distress.    Intensity  THRR unchanged      Progression   Progression  Continue to progress workloads to maintain intensity without signs/symptoms of physical distress.    Average METs  2.03      Resistance Training   Training Prescription  Yes    Weight  2 lbs    Reps  10-15      Interval Training   Interval Training  No      Treadmill   MPH  1.3    Grade  0.5    Minutes  15    METs  2.09      NuStep   Level  4    Minutes  15    METs  2      Biostep-RELP   Level  3    Minutes  15    METs  2      Home Exercise Plan   Plans to continue exercise at  Home (comment)   walking   Frequency  Add 2 additional days to program exercise sessions.    Initial Home Exercises Provided  03/28/18       Nutrition:  Target Goals: Understanding of nutrition guidelines, daily intake of sodium '1500mg'$ , cholesterol '200mg'$ , calories 30% from fat and 7% or less from saturated fats, daily to have 5 or more servings of fruits and vegetables.  Biometrics: Pre Biometrics - 03/06/18 1218      Pre Biometrics   Height  5' (1.524 m)    Weight  142 lb 14.4 oz (64.8 kg)    Waist Circumference  35 inches    Hip Circumference  41.5 inches    Waist to Hip Ratio  0.84 %    BMI (Calculated)  27.91      Post Biometrics - 07/11/18 1247       Post  Biometrics   Height  5' (1.524 m)    Weight  142 lb (64.4 kg)    Waist Circumference  34 inches    Hip Circumference  43 inches    Waist to  Hip Ratio  0.79 %    BMI (Calculated)  27.73       Nutrition Therapy Plan and Nutrition Goals: Nutrition Therapy & Goals - 03/19/18 1214      Nutrition Therapy   Diet  TLC    Drug/Food Interactions  Purine/Gout    Protein (specify units)  5-6oz    Fiber  20 grams    Whole Grain Foods  2 servings   3 ideal   Saturated Fats  10 max. grams    Fruits and Vegetables  5 servings/day   8 ideal, eats  small portions   Sodium  1500 grams      Personal Nutrition Goals   Nutrition Goal  On days when appetite is decreased, try to snack consistently between meals to help meet nutritional needs and maintain CBW    Personal Goal #2  Continue to monitor fluid and sodium intake r/t CKD as directed by your nephrologist    Comments  States her appetite varies day to day, but her eating pattern is typically a larger breakfast, small lunch and moderate dinner with snacks semi-regularly      Intervention Plan   Intervention  Prescribe, educate and counsel regarding individualized specific dietary modifications aiming towards targeted core components such as weight, hypertension, lipid management, diabetes, heart failure and other comorbidities.    Expected Outcomes  Short Term Goal: Understand basic principles of dietary content, such as calories, fat, sodium, cholesterol and nutrients.;Short Term Goal: A plan has been developed with personal nutrition goals set during dietitian appointment.;Long Term Goal: Adherence to prescribed nutrition plan.       Nutrition Assessments: Nutrition Assessments - 07/16/18 1513      MEDFICTS Scores   Pre Score  36    Post Score  35    Score Difference  -1       Nutrition Goals Re-Evaluation: Nutrition Goals Re-Evaluation    Row Name 03/19/18 1227 04/11/18 1541 05/02/18 1226 06/25/18 1419       Goals   Nutrition Goal  On days when appetite is decreased, try to snack consistently between meals to help meet nutritional needs and maintain CBW  Patient wants to maintain her weight and make healthier choices.  On days when appetite is decreased, try to add in snacks in order to help meet daily nutritional needs; continue to monitor sodium as directed by nephrologist; maintain CBW  On days when appetite is decreased try to incorporate snacks to help meet daily nutritional needs; continue to monitor fluid and sodium intake as directed by nephrologist; maintain CBW +/-5#     Comment  Reports her appeitite to vary day-to-day but feels that she tries to eat a variety of foods  Sometimes her appetite is good and sometimes she is not so hungry. She tries not to over eat.  She reports appetite to be better recently. She is snacking and eats sources of protein daily. Some common foods include nuts, fruit and chicken. She monitors sodium intake regularly  She feels she is maintaining her weight and that she is eating snacks when she feels up to it. Appetite still varies day to day and may be effected by sleep quality. She is trying to stay active and eat a variety of foods    Expected Outcome  She will eat on a consistent schedule each day, prioritizing total calories and protein intake  Short: make healthy eating choices when hungry. Long: maintain weight independently following dieticians reccomendations.  Maintain CBW and  practice a regular eating schedule to meet energy needs. Continue to monitor and moderate sodium intake  Maintain CBW +/-5# and aim to eat on a consistent schedule and to choose a variety of foods each day      Personal Goal #2 Re-Evaluation   Personal Goal #2  Continue to monitor fluid and sodium intake r/t CKD as directed by your nephrologist  -  -  -       Nutrition Goals Discharge (Final Nutrition Goals Re-Evaluation): Nutrition Goals Re-Evaluation - 06/25/18 1419      Goals   Nutrition Goal  On days when appetite is decreased try to incorporate snacks to help meet daily nutritional needs; continue to monitor fluid and sodium intake as directed by nephrologist; maintain CBW +/-5#    Comment  She feels she is maintaining her weight and that she is eating snacks when she feels up to it. Appetite still varies day to day and may be effected by sleep quality. She is trying to stay active and eat a variety of foods    Expected Outcome  Maintain CBW +/-5# and aim to eat on a consistent schedule and to choose a variety of foods each day        Psychosocial: Target Goals: Acknowledge presence or absence of significant depression and/or stress, maximize coping skills, provide positive support system. Participant is able to verbalize types and ability to use techniques and skills needed for reducing stress and depression.   Initial Review & Psychosocial Screening: Initial Psych Review & Screening - 03/06/18 1135      Initial Review   Current issues with  Current Sleep Concerns;Current Stress Concerns    Source of Stress Concerns  Chronic Illness    Comments  She gets short of breath walking.      Family Dynamics   Good Support System?  Yes    Comments  She can look to her daugher Tammy for support      Barriers   Psychosocial barriers to participate in program  The patient should benefit from training in stress management and relaxation.      Screening Interventions   Interventions  Encouraged to exercise;Provide feedback about the scores to participant;Program counselor consult;To provide support and resources with identified psychosocial needs    Expected Outcomes  Short Term goal: Utilizing psychosocial counselor, staff and physician to assist with identification of specific Stressors or current issues interfering with healing process. Setting desired goal for each stressor or current issue identified.;Long Term Goal: Stressors or current issues are controlled or eliminated.;Short Term goal: Identification and review with participant of any Quality of Life or Depression concerns found by scoring the questionnaire.;Long Term goal: The participant improves quality of Life and PHQ9 Scores as seen by post scores and/or verbalization of changes       Quality of Life Scores:  Scores of 19 and below usually indicate a poorer quality of life in these areas.  A difference of  2-3 points is a clinically meaningful difference.  A difference of 2-3 points in the total score of the Quality of Life Index has been associated with  significant improvement in overall quality of life, self-image, physical symptoms, and general health in studies assessing change in quality of life.  PHQ-9: Recent Review Flowsheet Data    Depression screen [facility] 2/9 07/16/2018 03/08/2018 03/06/2018 09/28/2017 07/26/2016   Decreased Interest 0 0 1 1 0   Down, Depressed, Hopeless 0 '1  1 3 '$ 0   PHQ -  2 Score 0 '1 2 4 '$ 0   Altered sleeping '1 2 2 2 '$ -   Tired, decreased energy '1 2 2 3 '$ -   Change in appetite 0 '1 2 2 '$ -   Feeling bad or failure about yourself  0 0 0 0 -   Trouble concentrating 0 0 0 0 -   Moving slowly or fidgety/restless 0 0 0 0 -   Suicidal thoughts 0 0 0 0 -   PHQ-9 Score '2 6 8 11 '$ -   Difficult doing work/chores Not difficult at all Not difficult at all Not difficult at all Somewhat difficult -     Interpretation of Total Score  Total Score Depression Severity:  1-4 = Minimal depression, 5-9 = Mild depression, 10-14 = Moderate depression, 15-19 = Moderately severe depression, 20-27 = Severe depression   Psychosocial Evaluation and Intervention: Psychosocial Evaluation - 03/14/18 1230      Psychosocial Evaluation & Interventions   Interventions  Encouraged to exercise with the program and follow exercise prescription;Stress management education    Comments  Counselor met with Ms. Enid Derry today Romie Minus) for initial psychsocial evaluation.  She is an 25 (almost 74) year old who has COPD.  Although Nanna lives alone she has a strong support system with a daughter; close friends and church family who are very involved in her life.  In addition to her pulmonary condition, Jennine mentioned a heart and kidney condition - but was unclear about details on those.  She reports sleeping "fair" with ~6 hours most nights of the week.  She is on medication that helps with this.  Catheryn reports her appetite has been up and down lately.  She states that anxiety symptoms have been a problem more recently and she was put on medication by her Dr. that "made me  sick."  So it was discontinued about a month ago.  She is typically in a positive mood most of the time and reports minimal stress other than her health and living alone - caring for the house, etc by herself.  Kjersten became tearful today as she mentioned the loss of one of her daughter approximately 18 months ago to Bone Cancer.  She relies on her faith and her support system to help her through this.  Noelene has goals to walk and breathe better while in this program.  Staff will follow with her.    Expected Outcomes  Short:  Aaryanna will exercise consistently for her stamina and strength and to help with sleep and stress management   Long:  Seraphina will develop a routine of exercise to have a better quality of life.      Continue Psychosocial Services   Follow up required by staff       Psychosocial Re-Evaluation: Psychosocial Re-Evaluation    [place] Name 04/11/18 1535 05/23/18 1225 06/22/18 1227 07/18/18 1443       Psychosocial Re-Evaluation   Current issues with  Current Sleep Concerns;Current Stress Concerns  Current Sleep Concerns;Current Stress Concerns  Current Stress Concerns  Current Stress Concerns    Comments  Patient has improved slightly with her shortness of breath. She went to the beach recently with her friends and had a great time eating crab. Her birthday is tomorrow and she will be 49. She is a happer person and wants to be in shape.  Jenilyn states that her sleeping is better due to an anti anxiety pill that she is on. She was going to bed and then having  alot on her mind and could not fall asleep. Now she gets to sleep easier and is doing better. She feels better in general as well. She will sometimes think about her daughter that has passed away and that stresses her out. The holiday is hard for her when the weather gets cold.  Patient has a good overall mental health state when she is participating in [place]. She has been working hard on her exercises and was informed to continue post  [facility] to maintain a healthy state of mind.   Patient states there is no changes in mental health and will continue to exercise after Pulmonary Rehab program joining [facility].    Expected Outcomes  Short: Attend [facility] stress management education to decrease stress. Long: Maintain exercise Post [facility] to keep stress at a minimum.  short: consult with the mental health conselor on staff. Long: take psychosocial and stress managment classes.  Short: Attend [facility] stress management education to decrease stress. Long: Maintain exercise Post [facility] to keep stress at a minimum.  Short: join the [facility] program to decrease stress. Long: Maintain exercise to keep stress at a minimum.    Interventions  Encouraged to attend Pulmonary Rehabilitation for the exercise  Encouraged to attend Pulmonary Rehabilitation for the exercise  Encouraged to attend Pulmonary Rehabilitation for the exercise  Encouraged to attend Pulmonary Rehabilitation for the exercise    Continue Psychosocial Services   Follow up required by staff  Follow up required by staff  Follow up required by staff  Follow up required by staff       Psychosocial Discharge (Final Psychosocial Re-Evaluation): Psychosocial Re-Evaluation - 07/18/18 1443      Psychosocial Re-Evaluation   Current issues with  Current Stress Concerns    Comments  Patient states there is no changes in mental health and will continue to exercise after Pulmonary Rehab program joining the [facility].    Expected Outcomes  Short: join the [facility] program to decrease stress. Long: Maintain exercise to keep stress at a minimum.    Interventions  Encouraged to attend Pulmonary Rehabilitation for the exercise    Continue Psychosocial Services   Follow up required by staff       Education: Education Goals: Education classes will be provided on a weekly basis, covering required topics. Participant will state understanding/return  demonstration of topics presented.  Learning Barriers/Preferences: Learning Barriers/Preferences - 03/06/18 1145      Learning Barriers/Preferences   Learning Barriers  Sight   wears glasses   Learning Preferences  None       Education Topics:  Initial Evaluation Education: - Verbal, written and demonstration of respiratory meds, oximetry and breathing techniques. Instruction on use of nebulizers and MDIs and importance of monitoring MDI activations.   Pulmonary Rehab from 07/18/2018 in [facility] Cardiac and Pulmonary Rehab  Date  03/06/18  Educator  [facility]  Instruction Review Code  1- Verbalizes Understanding      General Nutrition Guidelines/Fats and Fiber: -Group instruction provided by verbal, written material, models and posters to present the general guidelines for heart healthy nutrition. Gives an explanation and review of dietary fats and fiber.   Pulmonary Rehab from 07/18/2018 in [facility] Cardiac and Pulmonary Rehab  Date  06/13/18  Educator  [facility]  Instruction Review Code  1- Verbalizes Understanding      Controlling Sodium/Reading Food Labels: -Group verbal and written material supporting the discussion of sodium use in heart healthy nutrition. Review and explanation  with models, verbal and written materials for utilization of the food label.   Pulmonary Rehab from 07/18/2018 in [facility] Cardiac and Pulmonary Rehab  Date  06/27/18  Educator  [facility]  Instruction Review Code  1- Verbalizes Understanding      Exercise Physiology & General Exercise Guidelines: - Group verbal and written instruction with models to review the exercise physiology of the cardiovascular system and associated critical values. Provides general exercise guidelines with specific guidelines to those with heart or lung disease.    Pulmonary Rehab from 07/18/2018 in [facility] Cardiac and Pulmonary Rehab  Date  05/02/18  Educator  [facility]  Instruction Review Code  1- Verbalizes Understanding      Aerobic Exercise &  Resistance Training: - Gives group verbal and written instruction on the various components of exercise. Focuses on aerobic and resistive training programs and the benefits of this training and how to safely progress through these programs.   Pulmonary Rehab from 07/18/2018 in [facility] Cardiac and Pulmonary Rehab  Date  05/16/18  Educator  [facility]  Instruction Review Code  1- Verbalizes Understanding      Flexibility, Balance, Mind/Body Relaxation: Provides group verbal/written instruction on the benefits of flexibility and balance training, including mind/body exercise modes such as yoga, pilates and tai chi.  Demonstration and skill practice provided.   Pulmonary Rehab from 07/18/2018 in [facility] Cardiac and Pulmonary Rehab  Date  05/30/18  Educator  [facility]  Instruction Review Code  1- Verbalizes Understanding      Stress and Anxiety: - Provides group verbal and written instruction about the health risks of elevated stress and causes of high stress.  Discuss the correlation between heart/lung disease and anxiety and treatment options. Review healthy ways to manage with stress and anxiety.   Pulmonary Rehab from 07/18/2018 in [facility] Cardiac and Pulmonary Rehab  Date  03/14/18  Educator  [facility]  Instruction Review Code  1- Verbalizes Understanding      Depression: - Provides group verbal and written instruction on the correlation between heart/lung disease and depressed mood, treatment options, and the stigmas associated with seeking treatment.   Pulmonary Rehab from 07/18/2018 in [facility] Cardiac and Pulmonary Rehab  Date  05/23/18  Educator  [facility]  Instruction Review Code  1- Verbalizes Understanding      Exercise & Equipment Safety: - Individual verbal instruction and demonstration of equipment use and safety with use of the equipment.   Pulmonary Rehab from 07/18/2018 in [facility] Cardiac and Pulmonary Rehab  Date  03/06/18  Educator  [facility]  Instruction Review Code  1- Verbalizes Understanding       Infection Prevention: - Provides verbal and written material to individual with discussion of infection control including proper hand washing and proper equipment cleaning during exercise session.   Pulmonary Rehab from 07/18/2018 in [facility] Cardiac and Pulmonary Rehab  Date  03/06/18  Educator  [facility]  Instruction Review Code  1- Verbalizes Understanding      Falls Prevention: - Provides verbal and written material to individual with discussion of falls prevention and safety.   Pulmonary Rehab from 07/18/2018 in [facility] Cardiac and Pulmonary Rehab  Date  03/06/18  Educator  [facility]  Instruction Review Code  1- Verbalizes Understanding      Diabetes: - Individual verbal and written instruction to review signs/symptoms of diabetes, desired ranges of glucose level fasting, after meals and with exercise. Advice that pre and post exercise glucose checks will be done for 3 sessions at entry of program.  Chronic Lung Diseases: - Group verbal and written instruction to review updates, respiratory medications, advancements in procedures and treatments. Discuss use of supplemental oxygen including available portable oxygen systems, continuous and intermittent flow rates, concentrators, personal use and safety guidelines. Review proper use of inhaler and spacers. Provide informative websites for self-education.    Pulmonary Rehab from 07/18/2018 in [facility] Cardiac and Pulmonary Rehab  Date  07/11/18  Educator  [facility]  Instruction Review Code  1- Verbalizes Understanding      Energy Conservation: - Provide group verbal and written instruction for methods to conserve energy, plan and organize activities. Instruct on pacing techniques, use of adaptive equipment and posture/positioning to relieve shortness of breath.   Pulmonary Rehab from 07/18/2018 in [facility] Cardiac and Pulmonary Rehab  Date  06/06/18  Educator  [facility]  Instruction Review Code  1- Verbalizes Understanding      Triggers and Exacerbations: -  Group verbal and written instruction to review types of environmental triggers and ways to prevent exacerbations. Discuss weather changes, air quality and the benefits of nasal washing. Review warning signs and symptoms to help prevent infections. Discuss techniques for effective airway clearance, coughing, and vibrations.   Pulmonary Rehab from 07/18/2018 in [facility] Cardiac and Pulmonary Rehab  Date  05/09/18  Educator  [facility]  Instruction Review Code  1- Verbalizes Understanding      AED/CPR: - Group verbal and written instruction with the use of models to demonstrate the basic use of the AED with the basic ABC's of resuscitation.   Anatomy and Physiology of the Lungs: - Group verbal and written instruction with the use of models to provide basic lung anatomy and physiology related to function, structure and complications of lung disease.   Anatomy & Physiology of the Heart: - Group verbal and written instruction and models provide basic cardiac anatomy and physiology, with the coronary electrical and arterial systems. Review of Valvular disease and Heart Failure   Cardiac Medications: - Group verbal and written instruction to review commonly prescribed medications for heart disease. Reviews the medication, class of the drug, and side effects.   Pulmonary Rehab from 07/18/2018 in [facility] Cardiac and Pulmonary Rehab  Date  03/21/18  Educator  [facility]  Instruction Review Code  1- Verbalizes Understanding      Know Your Numbers and Risk Factors: -Group verbal and written instruction about important numbers in your health.  Discussion of what are risk factors and how they play a role in the disease process.  Review of Cholesterol, Blood Pressure, Diabetes, and BMI and the role they play in your overall health.   Sleep Hygiene: -Provides group verbal and written instruction about how sleep can affect your health.  Define sleep hygiene, discuss sleep cycles and impact of sleep habits. Review good  sleep hygiene tips.    Pulmonary Rehab from 07/18/2018 in [facility] Cardiac and Pulmonary Rehab  Date  07/18/18  Educator  [facility]  Instruction Review Code  1- Verbalizes Understanding      Other: -Provides group and verbal instruction on various topics (see comments)    Knowledge Questionnaire Score: Knowledge Questionnaire Score - 07/16/18 1512      Knowledge Questionnaire Score   Pre Score  11/18    Post Score  17/18   reviewed with patient       Core Components/Risk Factors/Patient Goals at Admission: Personal Goals and Risk Factors at Admission - 03/06/18 1146      Core Components/Risk Factors/Patient Goals on Admission    Weight  Management  Yes;Weight Maintenance;Weight Loss    Intervention  Weight Management: Develop a combined nutrition and exercise program designed to reach desired caloric intake, while maintaining appropriate intake of nutrient and fiber, sodium and fats, and appropriate energy expenditure required for the weight goal.;Weight Management: Provide education and appropriate resources to help participant work on and attain dietary goals.;Weight Management/Obesity: Establish reasonable short term and long term weight goals.    Admit Weight  142 lb 14.4 oz (64.8 kg)    Goal Weight: Short Term  135 lb (61.2 kg)    Goal Weight: Long Term  135 lb (61.2 kg)    Expected Outcomes  Short Term: Continue to assess and modify interventions until short term weight is achieved;Long Term: Adherence to nutrition and physical activity/exercise program aimed toward attainment of established weight goal;Weight Maintenance: Understanding of the daily nutrition guidelines, which includes 25-35% calories from fat, 7% or less cal from saturated fats, less than '200mg'$  cholesterol, less than 1.5gm of sodium, & 5 or more servings of fruits and vegetables daily;Understanding recommendations for meals to include 15-35% energy as protein, 25-35% energy from fat, 35-60% energy from carbohydrates, less  than '200mg'$  of dietary cholesterol, 20-35 gm of total fiber daily;Understanding of distribution of calorie intake throughout the day with the consumption of 4-5 meals/snacks;Weight Loss: Understanding of general recommendations for a balanced deficit meal plan, which promotes 1-2 lb weight loss per week and includes a negative energy balance of [phone] kcal/d    Improve shortness of breath with ADL's  Yes    Intervention  Provide education, individualized exercise plan and daily activity instruction to help decrease symptoms of SOB with activities of daily living.    Expected Outcomes  Short Term: Improve cardiorespiratory fitness to achieve a reduction of symptoms when performing ADLs;Long Term: Be able to perform more ADLs without symptoms or delay the onset of symptoms    Hypertension  Yes    Intervention  Provide education on lifestyle modifcations including regular physical activity/exercise, weight management, moderate sodium restriction and increased consumption of fresh fruit, vegetables, and low fat dairy, alcohol moderation, and smoking cessation.;Monitor prescription use compliance.    Expected Outcomes  Short Term: Continued assessment and intervention until BP is < 140/50m HG in hypertensive participants. < 130/867mHG in hypertensive participants with diabetes, heart failure or chronic kidney disease.;Long Term: Maintenance of blood pressure at goal levels.       Core Components/Risk Factors/Patient Goals Review:  Goals and Risk Factor Review    Row Name 04/11/18 1543 05/23/18 1229 06/22/18 1224 07/18/18 1445       Core Components/Risk Factors/Patient Goals Review   Personal Goals Review  Weight Management/Obesity;Hypertension;Improve shortness of breath with ADL's  Weight Management/Obesity;Hypertension;Improve shortness of breath with ADL's  Weight Management/Obesity;Hypertension;Improve shortness of breath with ADL's  Weight Management/Obesity;Hypertension;Improve shortness of  breath with ADL's    Review  Patient is working on losing a few pounds then maintaining her weight. Her blood pressure has been better since she started the program. Her shortness of breath has improved slightly. She has been walking at [employer] at [place]. She still gets short of breath when she walks for a long time. Patient states she wants to be able to walk further without getting short of breath.  -  Patiens blood pressure has been improving since the start of the program. [place] trying to stay busy and complete the program.  Patient is going to join [employer] maintain her exercise. She is  to graduate the [place] program next week. She wants to continue to improve her strength after graduating.    Expected Outcomes  Short: Attend [facility] regularly to improve shortness of breath with ADL's. Long: maintain independence with ADL's   -  Short: graduate [place]. Long: maintain exercise independently.  Short: graduate [place]. Long: join [employer] and maintain exercise routine       Core Components/Risk Factors/Patient Goals at Discharge (Final Review):  Goals and Risk Factor Review - 07/18/18 1445      Core Components/Risk Factors/Patient Goals Review   Personal Goals Review  Weight Management/Obesity;Hypertension;Improve shortness of breath with ADL's    Review  Patient is going to join [employer] to maintain her exercise. She is to graduate the [place] program next week. She wants to continue to improve her strength after graduating.    Expected Outcomes  Short: graduate [place]. Long: join [employer] and maintain exercise routine       ITP Comments: ITP Comments    Row Name 03/06/18 1110 04/02/18 0842 04/30/18 0847 05/28/18 0905 06/25/18 0848   ITP Comments  Medical Evaluation completed. Chart sent for review and changes to Dr. Emily Filbert Director of [place]. Diagnosis can be found in [facility] encounter 08/14/17  30 day review completed. ITP sent to Dr. Emily Filbert Director of  [place]. Continue with ITP unless changes are made by physician  30 day review completed. ITP sent to Dr. Emily Filbert Director of [place]. Continue with ITP unless changes are made by physician  30 day review completed. ITP sent to Dr. Emily Filbert Director of [place]. Continue with ITP unless changes are made by physician.  30 day review completed. ITP sent to Dr. Emily Filbert Director of [place]. Continue with ITP unless changes are made by physician.   [place] Name 07/23/18 0855           ITP Comments  30 day review completed. ITP sent to Dr. Emily Filbert Director of [place]. Continue with ITP unless changes are made by physician.          Comments: 30 day review

## 2018-07-23 NOTE — Progress Notes (Signed)
Daily Session Note  Patient Details  Name: Savannah Wilson MRN: [id number] Date of Birth: 03-04-1929 Referring Provider:     Pulmonary Rehab from 03/06/2018 in [facility] Cardiac and Pulmonary Rehab  Referring Provider  Derrel Nip      Encounter Date: 07/23/2018  Check In:      Social History   Tobacco Use  Smoking Status Never Smoker  Smokeless Tobacco Never Used    Goals Met:  Proper associated with RPD/PD & O2 Sat Independence with exercise equipment Exercise tolerated well Strength training completed today  Goals Unmet:  Not Applicable  Comments: Pt able to follow exercise prescription today without complaint.  Will continue to monitor for progression.    Dr. Emily Filbert is Medical Director for [place] and [facility].

## 2018-07-24 NOTE — Patient Instructions (Signed)
Discharge Patient Instructions  Patient Details  Name: Savannah Wilson MRN: [id number] Date of Birth: 01-30-1929 Referring Provider:  [name], MD   Number of Visits: 64  Reason for Discharge:  Patient reached a stable level of exercise. Patient independent in their exercise. Patient has met program and personal goals.  Smoking History:  Social History   Tobacco Use  Smoking Status Never Smoker  Smokeless Tobacco Never Used    Diagnosis:  Dyspnea on exertion  Initial Exercise Prescription: Initial Exercise Prescription - 03/06/18 1200      Date of Initial Exercise RX and Referring Provider   Date  03/06/18    Referring Provider  [name]      Treadmill   MPH  1    Grade  0    Minutes  14    METs  1.7      NuStep   Level  1    SPM  80    Minutes  15    METs  1.5      Biostep-RELP   Level  1    SPM  50    Minutes  15    METs  1.5      Prescription Details   Frequency (times per week)  2    Duration  Progress to 45 minutes of aerobic exercise without signs/symptoms of physical distress      Intensity   THRR 40-80% of Max Heartrate  94-121    Ratings of Perceived Exertion  11-13    Perceived Dyspnea  0-4      Resistance Training   Training Prescription  Yes    Weight  2 lb    Reps  10-15       Discharge Exercise Prescription (Final Exercise Prescription Changes): Exercise Prescription Changes - 07/10/18 1500      Response to Exercise   Blood Pressure (Admit)  124/66    Blood Pressure (Exit)  120/82    Heart Rate (Admit)  69 bpm    Heart Rate (Exercise)  90 bpm    Heart Rate (Exit)  68 bpm    Oxygen Saturation (Admit)  97 %    Oxygen Saturation (Exercise)  97 %    Oxygen Saturation (Exit)  100 %    Rating of Perceived Exertion (Exercise)  15    Perceived Dyspnea (Exercise)  1    Symptoms  none    Duration  Continue with 45 min of aerobic exercise without signs/symptoms of physical distress.    Intensity  THRR unchanged      Progression    Progression  Continue to progress workloads to maintain intensity without signs/symptoms of physical distress.    Average METs  2.03      Resistance Training   Training Prescription  Yes    Weight  2 lbs    Reps  10-15      Interval Training   Interval Training  No      Treadmill   MPH  1.3    Grade  0.5    Minutes  15    METs  2.09      NuStep   Level  4    Minutes  15    METs  2      Biostep-RELP   Level  3    Minutes  15    METs  2      Home Exercise Plan   Plans to continue exercise at  Home (comment)  walking   Frequency  Add 2 additional days to program exercise sessions.    Initial Home Exercises Provided  03/28/18       Functional Capacity: 6 Minute Walk    Row Name 03/06/18 1219 07/11/18 1244       6 Minute Walk   Phase  -  Discharge    Distance  610 feet  815 feet    Distance % Change  -  33.6 %    Distance Feet Change  -  205 ft    Walk Time  5.6 minutes  6 minutes    # of Rest Breaks  1  0    MPH  1.23  1.54    METS  0.6  1.34    RPE  13  13    Perceived Dyspnea   3  1    VO2 Peak  2.1  4.69    Symptoms  No  No    Resting HR  67 bpm  67 bpm    Resting BP  138/68  122/60    Resting Oxygen Saturation   97 %  95 %    Exercise Oxygen Saturation  during 6 min walk  95 %  93 %    Max Ex. HR  92 bpm  114 bpm    Max Ex. BP  138/60  158/72    2 Minute Post BP  130/64  136/74      Interval HR   1 Minute HR  86  90    2 Minute HR  89  92    3 Minute HR  92  94    4 Minute HR  84  101    5 Minute HR  88  114    6 Minute HR  88  102    2 Minute Post HR  64  72    Interval Heart Rate?  Yes  Yes      Interval Oxygen   Interval Oxygen?  Yes  Yes    Baseline Oxygen Saturation %  97 %  95 %    1 Minute Oxygen Saturation %  95 %  94 %    1 Minute Liters of Oxygen  0 L  0 L Room Air    2 Minute Oxygen Saturation %  95 %  94 %    2 Minute Liters of Oxygen  0 L  0 L    3 Minute Oxygen Saturation %  96 %  95 %    3 Minute Liters of Oxygen  0 L   0 L    4 Minute Oxygen Saturation %  96 %  94 %    4 Minute Liters of Oxygen  0 L  0 L    5 Minute Oxygen Saturation %  95 %  93 %    5 Minute Liters of Oxygen  0 L  0 L    6 Minute Oxygen Saturation %  96 %  93 %    6 Minute Liters of Oxygen  0 L  0 L    2 Minute Post Oxygen Saturation %  97 %  96 %    2 Minute Post Liters of Oxygen  0 L  0 L       Quality of Life:   Personal Goals: Goals established at orientation with interventions provided to work toward goal. Personal Goals and Risk Factors at Admission - 03/06/18 1146  Core Components/Risk Factors/Patient Goals on Admission    Weight Management  Yes;Weight Maintenance;Weight Loss    Intervention  Weight Management: Develop a combined nutrition and exercise program designed to reach desired caloric intake, while maintaining appropriate intake of nutrient and fiber, sodium and fats, and appropriate energy expenditure required for the weight goal.;Weight Management: Provide education and appropriate resources to help participant work on and attain dietary goals.;Weight Management/Obesity: Establish reasonable short term and long term weight goals.    Admit Weight  142 lb 14.4 oz (64.8 kg)    Goal Weight: Short Term  135 lb (61.2 kg)    Goal Weight: Long Term  135 lb (61.2 kg)    Expected Outcomes  Short Term: Continue to assess and modify interventions until short term weight is achieved;Long Term: Adherence to nutrition and physical activity/exercise program aimed toward attainment of established weight goal;Weight Maintenance: Understanding of the daily nutrition guidelines, which includes 25-35% calories from fat, 7% or less cal from saturated fats, less than 26m cholesterol, less than 1.5gm of sodium, & 5 or more servings of fruits and vegetables daily;Understanding recommendations for meals to include 15-35% energy as protein, 25-35% energy from fat, 35-60% energy from carbohydrates, less than 2061mof dietary cholesterol,  20-35 gm of total fiber daily;Understanding of distribution of calorie intake throughout the day with the consumption of 4-5 meals/snacks;Weight Loss: Understanding of general recommendations for a balanced deficit meal plan, which promotes 1-2 lb weight loss per week and includes a negative energy balance of [phone] kcal/d    Improve shortness of breath with ADL's  Yes    Intervention  Provide education, individualized exercise plan and daily activity instruction to help decrease symptoms of SOB with activities of daily living.    Expected Outcomes  Short Term: Improve cardiorespiratory fitness to achieve a reduction of symptoms when performing ADLs;Long Term: Be able to perform more ADLs without symptoms or delay the onset of symptoms    Hypertension  Yes    Intervention  Provide education on lifestyle modifcations including regular physical activity/exercise, weight management, moderate sodium restriction and increased consumption of fresh fruit, vegetables, and low fat dairy, alcohol moderation, and smoking cessation.;Monitor prescription use compliance.    Expected Outcomes  Short Term: Continued assessment and intervention until BP is < 140/9068mG in hypertensive participants. < 130/62m24m in hypertensive participants with diabetes, heart failure or chronic kidney disease.;Long Term: Maintenance of blood pressure at goal levels.        Personal Goals Discharge: Goals and Risk Factor Review - 07/18/18 1445      Core Components/Risk Factors/Patient Goals Review   Personal Goals Review  Weight Management/Obesity;Hypertension;Improve shortness of breath with ADL's    Review  Patient is going to join [employer]. She is to graduate [facility] [employer] next week. She wants to continue to improve her strength after graduating.    Expected Outcomes  Short: graduate [employer]: join [employer] and maintain exercise routine       Exercise Goals and Review: Exercise  Goals    Row Name 03/06/18 1218             Exercise Goals   Increase Physical Activity  Yes       Intervention  Provide advice, education, support and counseling about physical activity/exercise needs.;Develop an individualized exercise prescription for aerobic and resistive training based on initial evaluation findings, risk stratification, comorbidities and participant's personal goals.       Expected  Outcomes  Short Term: Attend rehab on a regular basis to increase amount of physical activity.;Long Term: Add in home exercise to make exercise part of routine and to increase amount of physical activity.;Long Term: Exercising regularly at least 3-5 days a week.       Increase Strength and Stamina  Yes       Intervention  Provide advice, education, support and counseling about physical activity/exercise needs.;Develop an individualized exercise prescription for aerobic and resistive training based on initial evaluation findings, risk stratification, comorbidities and participant's personal goals.       Expected Outcomes  Short Term: Increase workloads from initial exercise prescription for resistance, speed, and METs.;Short Term: Perform resistance training exercises routinely during rehab and add in resistance training at home;Long Term: Improve cardiorespiratory fitness, muscular endurance and strength as measured by increased METs and functional capacity (6MWT)       Able to understand and use rate of perceived exertion (RPE) scale  Yes       Intervention  Provide education and explanation on how to use RPE scale       Expected Outcomes  Short Term: Able to use RPE daily in rehab to express subjective intensity level;Long Term:  Able to use RPE to guide intensity level when exercising independently       Able to understand and use Dyspnea scale  Yes       Intervention  Provide education and explanation on how to use Dyspnea scale       Expected Outcomes  Short Term: Able to use Dyspnea scale  daily in rehab to express subjective sense of shortness of breath during exertion;Long Term: Able to use Dyspnea scale to guide intensity level when exercising independently       Knowledge and understanding of Target Heart Rate Range (THRR)  Yes       Intervention  Provide education and explanation of THRR including how the numbers were predicted and where they are located for reference       Expected Outcomes  Short Term: Able to state/look up THRR;Short Term: Able to use daily as guideline for intensity in rehab;Long Term: Able to use THRR to govern intensity when exercising independently       Able to check pulse independently  Yes       Intervention  Provide education and demonstration on how to check pulse in carotid and radial arteries.;Review the importance of being able to check your own pulse for safety during independent exercise       Expected Outcomes  Short Term: Able to explain why pulse checking is important during independent exercise;Long Term: Able to check pulse independently and accurately       Understanding of Exercise Prescription  Yes       Intervention  Provide education, explanation, and written materials on patient's individual exercise prescription       Expected Outcomes  Short Term: Able to explain program exercise prescription;Long Term: Able to explain home exercise prescription to exercise independently          Exercise Goals Re-Evaluation: Exercise Goals Re-Evaluation    Row Name 03/14/18 1126 03/20/18 1447 03/28/18 1212 04/02/18 1554 04/18/18 0821     Exercise Goal Re-Evaluation   Exercise Goals Review  Increase Physical Activity;Increase Strength and Stamina;Able to understand and use rate of perceived exertion (RPE) scale;Able to understand and use Dyspnea scale;Understanding of Exercise Prescription;Knowledge and understanding of Target Heart Rate Range (THRR)  Increase Physical Activity;Increase Strength and Stamina;Understanding  of Exercise Prescription   Increase Physical Activity;Increase Strength and Stamina;Understanding of Exercise Prescription  Increase Physical Activity;Increase Strength and Stamina;Understanding of Exercise Prescription  Increase Physical Activity;Increase Strength and Stamina;Understanding of Exercise Prescription   Comments  Reviewed RPE scale, THR and program prescription with pt today.  Pt voiced understanding and was given a copy of goals to take home.   Makenze is off to a good start in rehab.  She has completed two full days of exercise.  All of her workloads are between 13-15 on the RPE scale.  We will continue to monitor her progress.   Reviewed home exercise with pt today.  Pt plans to walk at home and stores for exercise.  She is going to start by adding in one extra day at home to get to three days a week.  Reviewed THR, pulse, RPE, sign and symptoms, and when to call 911 or MD.  Also discussed weather considerations and indoor options.  Pt voiced understanding.  Brietta has been doing well in rehab.  She is still at baseline workloads, but we will try to incease the NuStep for her. We will continue to monitor her progress.   Ninoska continues to do well in rehab.  She is maintain 1.6 METs on the NuStep.  We will try to increase her workloads again.  We will continue to monitor her progression.    Expected Outcomes  Short: Use RPE daily to regulate intensity. Long: Follow program prescription in THR.  Short: Continue to attend rehab regularly.  Long: Continue to follow program prescription.  Short: Add in at least one extra day a week next week.  Long: Start to exercise independently  Short: Increase workload on NuStep.  Long: Continue to increase phsycial activity levels.   Short: Increase workloads.   Long: Continue to increase strength and stamina.    [place] Name 05/02/18 1524 05/15/18 0945 05/30/18 1428 06/12/18 1611 06/26/18 1627     Exercise Goal Re-Evaluation   Exercise Goals Review  Increase Physical Activity;Increase Strength  and Stamina;Understanding of Exercise Prescription  Increase Physical Activity;Increase Strength and Stamina;Understanding of Exercise Prescription  Increase Physical Activity;Increase Strength and Stamina;Understanding of Exercise Prescription  Increase Physical Activity;Increase Strength and Stamina;Understanding of Exercise Prescription  Increase Physical Activity;Increase Strength and Stamina;Understanding of Exercise Prescription   Comments  Fujie continues to do well in rehab. She is up to level 2 on the BioStep.  We will continue to monitor her progression.   Sol has been doing well in rehab.   She is now on level 3 for the BioStep.  She continues to work hard in here for her.  We will continue to monitor her progress.   Nerine continues to do well in rehab.  She is now up to 1.3 mph on the treadmill.  We will continue to monitor her progression.   Meganne continues to work in class. She dropped back down to 1.2 mph and we will work with her to get back up to 1.3 mph.  We will continue to monitor her progress.  Leiliana has been doing well in rehab.  She is back up to her 1.3 mph again.  She is also doing level 4 on the NuStep.  We will continue to monitor her progression.    Expected Outcomes  Short: Increase workload on NuStep. Long: Continue to improve strength and stamina.   Short: Increase NuStep and treadmill.  Long: Continue to exercise at home on off days.   Short: Continue to  move up workloads.  Long: Continue to encourage more activity at home.   Short: Get speed on treadmill back up to 1.3 mph.  Long: Continue to move more at home.   Short: Increase BioStep to level 4.  Long: Continue to increase her physical activity.    [place] Name 07/10/18 1519             Exercise Goal Re-Evaluation   Exercise Goals Review  Increase Physical Activity;Increase Strength and Stamina;Understanding of Exercise Prescription       Comments  Jaielle is nearing graduation and will be doing her post 6MWT soon.  I expect to  see her improve some.  She is planning to continue to exercise by walking at home.  We will continue to monitor her progress.        Expected Outcomes  Short: Improve post 6MWT.  Long: Continue to exercise independently.           Nutrition & Weight - Outcomes: Pre Biometrics - 03/06/18 1218      Pre Biometrics   Height  5' (1.524 m)    Weight  142 lb 14.4 oz (64.8 kg)    Waist Circumference  35 inches    Hip Circumference  41.5 inches    Waist to Hip Ratio  0.84 %    BMI (Calculated)  27.91      Post Biometrics - 07/11/18 1247       Post  Biometrics   Height  5' (1.524 m)    Weight  142 lb (64.4 kg)    Waist Circumference  34 inches    Hip Circumference  43 inches    Waist to Hip Ratio  0.79 %    BMI (Calculated)  27.73       Nutrition: Nutrition Therapy & Goals - 03/19/18 1214      Nutrition Therapy   Diet  TLC    Drug/Food Interactions  Purine/Gout    Protein (specify units)  5-6oz    Fiber  20 grams    Whole Grain Foods  2 servings   3 ideal   Saturated Fats  10 max. grams    Fruits and Vegetables  5 servings/day   8 ideal, eats small portions   Sodium  1500 grams      Personal Nutrition Goals   Nutrition Goal  On days when appetite is decreased, try to snack consistently between meals to help meet nutritional needs and maintain CBW    Personal Goal #2  Continue to monitor fluid and sodium intake r/t CKD as directed by your nephrologist    Comments  States her appetite varies day to day, but her eating pattern is typically a larger breakfast, small lunch and moderate dinner with snacks semi-regularly      Intervention Plan   Intervention  Prescribe, educate and counsel regarding individualized specific dietary modifications aiming towards targeted core components such as weight, hypertension, lipid management, diabetes, heart failure and other comorbidities.    Expected Outcomes  Short Term Goal: Understand basic principles of dietary content, such as calories,  fat, sodium, cholesterol and nutrients.;Short Term Goal: A plan has been developed with personal nutrition goals set during dietitian appointment.;Long Term Goal: Adherence to prescribed nutrition plan.       Nutrition Discharge: Nutrition Assessments - 07/16/18 1513      MEDFICTS Scores   Pre Score  36    Post Score  35    Score Difference  -1  Education Questionnaire Score: Knowledge Questionnaire Score - 07/16/18 1512      Knowledge Questionnaire Score   Pre Score  11/18    Post Score  17/18   reviewed with patient      Goals reviewed with patient; copy given to patient.

## 2018-07-25 ENCOUNTER — Encounter: Payer: PPO | Admitting: *Deleted

## 2018-07-25 DIAGNOSIS — R0609 Other forms of dyspnea: Secondary | ICD-10-CM | POA: Diagnosis not present

## 2018-07-25 NOTE — Progress Notes (Signed)
Pulmonary Individual Treatment Plan  Patient Details  Name: Savannah Wilson MRN: [id number] Date of Birth: 1928-10-25 Referring Provider:     Pulmonary Rehab from 03/06/2018 in [facility]  Referring Provider  [name]      Initial Encounter Date:    Pulmonary Rehab from 03/06/2018 in [facility] Re[facility]  Date  03/06/18      Visit Diagnosis: Dyspnea on exertion  Patient's Home Medications on Admission:  Current Outpatient Medications:  .  acetaminophen (TYLENOL) 650 MG CR tablet, Take 650 mg by mouth every 8 (eight) hours as needed for pain. , Disp: , Rfl:  .  ALPRAZolam (XANAX) 0.5 MG tablet, Take 1 tablet (0.5 mg total) by mouth 2 (two) times daily as needed for anxiety., Disp: 45 tablet, Rfl: 5 .  apixaban (ELIQUIS) 2.5 MG TABS tablet, Take 1 tablet (2.5 mg total) by mouth 2 (two) times daily., Disp: 180 tablet, Rfl: 3 .  benzonatate (TESSALON) 200 MG capsule, Take 1 capsule (200 mg total) by mouth 3 (three) times daily as needed for cough., Disp: 60 capsule, Rfl: 1 .  budesonide (PULMICORT) 0.25 MG/2ML nebulizer solution, Take 2 mLs (0.25 mg total) by nebulization 2 (two) times daily., Disp: 60 mL, Rfl: 12 .  cetirizine (ZYRTEC ALLERGY) 10 MG tablet, Take 10 mg by mouth daily., Disp: , Rfl:  .  Cholecalciferol (VITAMIN D-3) 1000 units CAPS, Take 1,000 Units by mouth daily. , Disp: , Rfl:  .  escitalopram (LEXAPRO) 10 MG tablet, Take 1 tablet (10 mg total) by mouth daily. After dinner, Disp: 90 tablet, Rfl: 1 .  fluticasone (FLONASE) 50 MCG/ACT nasal spray, Place 2 sprays into the nose daily. (Patient taking differently: Place 2 sprays into the nose daily as needed (For allergies.). ), Disp: 16 g, Rfl: 6 .  furosemide (LASIX) 20 MG tablet, TAKE ONE TABLET EVERY DAY, Disp: 90 tablet, Rfl: 2 .  furosemide (LASIX) 40 MG tablet, Take 40 mg by mouth daily., Disp: , Rfl:  .  hydrALAZINE (APRESOLINE) 50 MG tablet, TAKE ONE TABLET BY MOUTH 3 TIMES DAILY, Disp: 90  tablet, Rfl: 1 .  ipratropium-albuterol (DUONEB) 0.5-2.5 (3) MG/3ML SOLN, Take 3 mLs by nebulization every 6 (six) hours as needed., Disp: 360 mL, Rfl: 0 .  levothyroxine (SYNTHROID, LEVOTHROID) 100 MCG tablet, TAKE ONE TABLET BY MOUTH EVERY DAY FOR THYROID, Disp: 90 tablet, Rfl: 0 .  losartan (COZAAR) 50 MG tablet, Take 1 tablet (50 mg total) by mouth daily., Disp: 90 tablet, Rfl: 0 .  mupirocin ointment (BACTROBAN) 2 %, Place 1 application into the nose 2 (two) times daily., Disp: 22 g, Rfl: 0 .  omeprazole (PRILOSEC) 40 MG capsule, TAKE ONE CAPSULE BY MOUTH TWICE A DAY. BEFORE BREAKFAST AND SUPPER, Disp: 180 capsule, Rfl: 1 .  PROAIR HFA 108 (90 BASE) MCG/ACT inhaler, INHALE 2 PUFFS EVERY 6 HOURS AS NEEDED FOR WHEEZING, Disp: 8.5 g, Rfl: 3 .  rOPINIRole (REQUIP) 0.25 MG tablet, Take 1 tablet (0.25 mg total) by mouth 3 (three) times daily., Disp: 90 tablet, Rfl: 0 .  Spacer/Aero-Holding Chambers (OPTICHAMBER ADVANTAGE) MISC, 1 each by Other route once. Always uses her when you're using a metered-dose inhaler. You've aromatase medicine as much, he won't have his much side effect, but you it twice as much medicine and your lungs., Disp: 1 each, Rfl: 0 .  temazepam (RESTORIL) 15 MG capsule, TAKE 1 CAPSULE BY MOUTH AT BEDTIME AS NEEDED FOR SLEEP, Disp: 30 capsule, Rfl: 5 .  vitamin B-12 (CYANOCOBALAMIN) 1000 MCG tablet, Take 1,000 mcg by mouth daily., Disp: , Rfl:   Past Medical History: Past Medical History:  Diagnosis Date  . Asthma   . Barrett esophagus   . Chronic bronchitis   . Chronic kidney disease (CKD), stage IV (severe) ([place])   . Depression   . Diverticulosis   . GERD (gastroesophageal reflux disease)   . Gout   . Hyperlipidemia   . Hypertension   . Hypothyroid   . Mitral regurgitation    a. TTE 02/2017: EF 55-60%, normal WM, calcified mitral annulus with mod regurg, moderately dilated LA, unable to estimate PASP  . Pancreatitis due to biliary obstruction March 2010   s/p ERCP  sphincterotomy, cholecystectomy  . Peripheral neuralgia   . Persistent atrial fibrillation    a. diagnosed 01/24/17; CHADS2VASc => 5 (HTN, age x 2, vascular disease with PAD and aortic plaque, female) giving her an estimated annual stroke risk of 6.7%; b. successful DCCV 03/28/2017; c. on Eliquis  . Venous insufficiency     Tobacco Use: Social History   Tobacco Use  Smoking Status Never Smoker  Smokeless Tobacco Never Used    Labs: Recent Review Flowsheet Data    Labs for ITP Cardiac and Pulmonary Rehab Latest Ref Rng & Units 10/28/2013 06/16/2016   Cholestrol 0 - 200 mg/dL 116 124   LDLCALC 0 - 99 mg/dL 58 64   HDL >39.00 mg/dL 26.30(L) 42.50   Trlycerides 0.0 - 149.0 mg/dL 159.0(H) 87.0   Hemoglobin A1c 4.6 - 6.5 % - 5.0       Pulmonary Assessment Scores: Pulmonary Assessment Scores    Row Name 03/06/18 1112 07/11/18 1247 07/16/18 1511     ADL UCSD   ADL Phase  Entry  Exit  Exit   SOB Score total  55  -  37   Rest  0  -  0   Walk  2  -  1   Stairs  4  -  3   Bath  1  -  2   Dress  1  -  2   Shop  4  -  2     CAT Score   CAT Score  17  -  13     mMRC Score   mMRC Score  2  1  -      Pulmonary Function Assessment: Pulmonary Function Assessment - 03/06/18 1140      Breath   Bilateral Breath Sounds  Clear    Shortness of Breath  Yes;Limiting activity       Exercise Target Goals: Exercise Program Goal: Individual exercise prescription set using results from initial 6 min walk test and THRR while considering  patient's activity barriers and safety.   Exercise Prescription Goal: Initial exercise prescription builds to 30-45 minutes a day of aerobic activity, 2-3 days per week.  Home exercise guidelines will be given to patient during program as part of exercise prescription that the participant will acknowledge.  Activity Barriers & Risk Stratification:   6 Minute Walk: 6 Minute Walk    Row Name 03/06/18 1219 07/11/18 1244       6 Minute Walk   Phase   -  Discharge    Distance  610 feet  815 feet    Distance % Change  -  33.6 %    Distance Feet Change  -  205 ft    Walk Time  5.6 minutes  6 minutes    #  of Rest Breaks  1  0    MPH  1.23  1.54    METS  0.6  1.34    RPE  13  13    Perceived Dyspnea   3  1    VO2 Peak  2.1  4.69    Symptoms  No  No    Resting HR  67 bpm  67 bpm    Resting BP  138/68  122/60    Resting Oxygen Saturation   97 %  95 %    Exercise Oxygen Saturation  during 6 min walk  95 %  93 %    Max Ex. HR  92 bpm  114 bpm    Max Ex. BP  138/60  158/72    2 Minute Post BP  130/64  136/74      Interval HR   1 Minute HR  86  90    2 Minute HR  89  92    3 Minute HR  92  94    4 Minute HR  84  101    5 Minute HR  88  114    6 Minute HR  88  102    2 Minute Post HR  64  72    Interval Heart Rate?  Yes  Yes      Interval Oxygen   Interval Oxygen?  Yes  Yes    Baseline Oxygen Saturation %  97 %  95 %    1 Minute Oxygen Saturation %  95 %  94 %    1 Minute Liters of Oxygen  0 L  0 L Room Air    2 Minute Oxygen Saturation %  95 %  94 %    2 Minute Liters of Oxygen  0 L  0 L    3 Minute Oxygen Saturation %  96 %  95 %    3 Minute Liters of Oxygen  0 L  0 L    4 Minute Oxygen Saturation %  96 %  94 %    4 Minute Liters of Oxygen  0 L  0 L    5 Minute Oxygen Saturation %  95 %  93 %    5 Minute Liters of Oxygen  0 L  0 L    6 Minute Oxygen Saturation %  96 %  93 %    6 Minute Liters of Oxygen  0 L  0 L    2 Minute Post Oxygen Saturation %  97 %  96 %    2 Minute Post Liters of Oxygen  0 L  0 L      Oxygen Initial Assessment: Oxygen Initial Assessment - 03/06/18 1145      Home Oxygen   Home Oxygen Device  None    Sleep Oxygen Prescription  None    Home Exercise Oxygen Prescription  None    Home at Rest Exercise Oxygen Prescription  None      Initial 6 min Walk   Oxygen Used  None      Program Oxygen Prescription   Program Oxygen Prescription  None      Intervention   Short Term Goals  To learn and  demonstrate proper use of respiratory medications;To learn and understand importance of maintaining oxygen saturations>88%;To learn and demonstrate proper pursed lip breathing techniques or other breathing techniques.;To learn and understand importance of monitoring SPO2 with pulse oximeter and demonstrate accurate use of the  pulse oximeter.    Long  Term Goals  Verbalizes importance of monitoring SPO2 with pulse oximeter and return demonstration;Maintenance of O2 saturations>88%;Compliance with respiratory medication;Demonstrates proper use of MDI's;Exhibits proper breathing techniques, such as pursed lip breathing or other method taught during program session       Oxygen Re-Evaluation: Oxygen Re-Evaluation    Row Name 03/14/18 1126 04/11/18 1520 05/21/18 1553 06/22/18 1220 07/18/18 1437     Program Oxygen Prescription   Program Oxygen Prescription  None  None  None  None  None     Home Oxygen   Home Oxygen Device  None  None  None  None  None   Sleep Oxygen Prescription  None  None  None  None  None   Home Exercise Oxygen Prescription  None  None  None  None  None   Home at Rest Exercise Oxygen Prescription  None  None  None  None  None     Goals/Expected Outcomes   Short Term Goals  To learn and demonstrate proper use of respiratory medications;To learn and understand importance of maintaining oxygen saturations>88%;To learn and demonstrate proper pursed lip breathing techniques or other breathing techniques.;To learn and understand importance of monitoring SPO2 with pulse oximeter and demonstrate accurate use of the pulse oximeter.  To learn and demonstrate proper use of respiratory medications;To learn and understand importance of maintaining oxygen saturations>88%;To learn and demonstrate proper pursed lip breathing techniques or other breathing techniques.;To learn and understand importance of monitoring SPO2 with pulse oximeter and demonstrate accurate use of the pulse oximeter.  To  learn and demonstrate proper use of respiratory medications;To learn and understand importance of maintaining oxygen saturations>88%;To learn and demonstrate proper pursed lip breathing techniques or other breathing techniques.;To learn and understand importance of monitoring SPO2 with pulse oximeter and demonstrate accurate use of the pulse oximeter.  To learn and demonstrate proper use of respiratory medications;To learn and understand importance of maintaining oxygen saturations>88%;To learn and demonstrate proper pursed lip breathing techniques or other breathing techniques.;To learn and understand importance of monitoring SPO2 with pulse oximeter and demonstrate accurate use of the pulse oximeter.  To learn and demonstrate proper use of respiratory medications;To learn and understand importance of maintaining oxygen saturations>88%;To learn and demonstrate proper pursed lip breathing techniques or other breathing techniques.;To learn and understand importance of monitoring SPO2 with pulse oximeter and demonstrate accurate use of the pulse oximeter.   Long  Term Goals  Verbalizes importance of monitoring SPO2 with pulse oximeter and return demonstration;Maintenance of O2 saturations>88%;Compliance with respiratory medication;Demonstrates proper use of MDI's;Exhibits proper breathing techniques, such as pursed lip breathing or other method taught during program session  Verbalizes importance of monitoring SPO2 with pulse oximeter and return demonstration;Maintenance of O2 saturations>88%;Compliance with respiratory medication;Demonstrates proper use of MDI's;Exhibits proper breathing techniques, such as pursed lip breathing or other method taught during program session  Verbalizes importance of monitoring SPO2 with pulse oximeter and return demonstration;Maintenance of O2 saturations>88%;Compliance with respiratory medication;Demonstrates proper use of MDI's;Exhibits proper breathing techniques, such as  pursed lip breathing or other method taught during program session  Verbalizes importance of monitoring SPO2 with pulse oximeter and return demonstration;Maintenance of O2 saturations>88%;Compliance with respiratory medication;Demonstrates proper use of MDI's;Exhibits proper breathing techniques, such as pursed lip breathing or other method taught during program session  Verbalizes importance of monitoring SPO2 with pulse oximeter and return demonstration;Maintenance of O2 saturations>88%;Compliance with respiratory medication;Demonstrates proper use of MDI's;Exhibits proper breathing techniques, such as pursed lip breathing  or other method taught during program session   Comments  Reviewed PLB technique with pt.  Talked about how it work and it's important to maintaining his exercise saturations.    Patient checks her oxygen once in awhile when she is at home. Talked about the importance of checking her oxygen at home and making sure she is 88 percent or above.  Patient has been monitoring her oxygen at home whith exertion. She states she is in the high 80's to low 90's with exertion. She is walking slightly faster on the treadmill and does not require any oxyen.  Karem needs to work on PLB more when she is exercising. She state that she is bad at using PLB when she is short of breath.  Patient has been working on her breathing when she is on the treadmill. Her shortness of breath is improving slightly. She has been enjoying the program and is close to graduating.  Patients oxygen has been good throughout the progam and has not required any. She is compliant with her medications. Twania is going to join [employer] after Rehab to continue to exercise.   Goals/Expected Outcomes  Short: Become more profiecient at using PLB.   Long: Become independent at using PLB.  Short: monitor oxygen at home with exertion. Long: maintain oxygen saturations above 88 percent independently.  Short: work on PLB while on the treadmill.  Long: use PLB when short of breath independently.  Short: continue to work on PLB and attend regularly. Long: use PLB independently and graduate [facility].  Short: join [employer] to continue exercise. Long: mainain checking oxygen independently in [employer].      Oxygen Discharge (Final Oxygen Re-Evaluation): Oxygen Re-Evaluation - 07/18/18 1437      Program Oxygen Prescription   Program Oxygen Prescription  None      Home Oxygen   Home Oxygen Device  None    Sleep Oxygen Prescription  None    Home Exercise Oxygen Prescription  None    Home at Rest Exercise Oxygen Prescription  None      Goals/Expected Outcomes   Short Term Goals  To learn and demonstrate proper use of respiratory medications;To learn and understand importance of maintaining oxygen saturations>88%;To learn and demonstrate proper pursed lip breathing techniques or other breathing techniques.;To learn and understand importance of monitoring SPO2 with pulse oximeter and demonstrate accurate use of the pulse oximeter.    Long  Term Goals  Verbalizes importance of monitoring SPO2 with pulse oximeter and return demonstration;Maintenance of O2 saturations>88%;Compliance with respiratory medication;Demonstrates proper use of MDI's;Exhibits proper breathing techniques, such as pursed lip breathing or other method taught during program session    Comments  Patients oxygen has been good throughout the progam and has not required any. She is compliant with her medications. Lamerle is going to join [employer] after Rehab to continue to exercise.    Goals/Expected Outcomes  Short: join [employer] to continue exercise. Long: mainain checking oxygen independently in [employer].       Initial Exercise Prescription: Initial Exercise Prescription - 03/06/18 1200      Date of Initial Exercise RX and Referring Provider   Date  03/06/18    Referring Provider  [name]      Treadmill   MPH  1    Grade  0    Minutes  14    METs  1.7       NuStep   Level  1    SPM  80    Minutes  15    METs  1.5      Biostep-RELP   Level  1    SPM  50    Minutes  15    METs  1.5      Prescription Details   Frequency (times per week)  2    Duration  Progress to 45 minutes of aerobic exercise without signs/symptoms of physical distress      Intensity   THRR 40-80% of Max Heartrate  94-121    Ratings of Perceived Exertion  11-13    Perceived Dyspnea  0-4      Resistance Training   Training Prescription  Yes    Weight  2 lb    Reps  10-15       Perform Capillary Blood Glucose checks as needed.  Exercise Prescription Changes: Exercise Prescription Changes    Row Name 03/20/18 1400 03/28/18 1200 04/02/18 1500 04/18/18 0800 05/02/18 1500     Response to Exercise   Blood Pressure (Admit)  124/66  -  116/60  130/82  124/82   Blood Pressure (Exercise)  138/68  -  -  -  -   Blood Pressure (Exit)  120/82  -  110/72  118/64  128/62   Heart Rate (Admit)  69 bpm  -  75 bpm  70 bpm  59 bpm   Heart Rate (Exercise)  90 bpm  -  82 bpm  90 bpm  122 bpm   Heart Rate (Exit)  68 bpm  -  66 bpm  67 bpm  62 bpm   Oxygen Saturation (Admit)  97 %  -  64 %  98 %  97 %   Oxygen Saturation (Exercise)  97 %  -  100 %  97 %  96 %   Oxygen Saturation (Exit)  100 %  -  98 %  99 %  99 %   Rating of Perceived Exertion (Exercise)  15  -  _0 Perceived Dyspnea (Exercise)  3  -  1  0  2   Symptoms  SOB and fatigue  -  SOB and fatigue  SOB and fatigue  SOB and fatigue   Comments  second full day of exercise  -  -  -  -   Duration  Progress to 45 minutes of aerobic exercise without signs/symptoms of physical distress  -  Continue with 45 min of aerobic exercise without signs/symptoms of physical distress.  Continue with 45 min of aerobic exercise without signs/symptoms of physical distress.  Continue with 45 min of aerobic exercise without signs/symptoms of physical distress.   Intensity  THRR unchanged  -  THRR unchanged  THRR unchanged  THRR  unchanged     Progression   Progression  Continue to progress workloads to maintain intensity without signs/symptoms of physical distress.  -  Continue to progress workloads to maintain intensity without signs/symptoms of physical distress.  Continue to progress workloads to maintain intensity without signs/symptoms of physical distress.  Continue to progress workloads to maintain intensity without signs/symptoms of physical distress.   Average METs  1.84  -  1.79  1.79  1.79     Resistance Training   Training Prescription  Yes  -  Yes  Yes  Yes   Weight  2 lbs  -  2 lbs  2 lbs  2 lbs   Reps  10-15  -  10-15  10-15  10-15     Interval Training   Interval Training  No  -  No  No  No     Treadmill   MPH  1  -  _0 Grade  0  -  0  0  0   Minutes  15  -  _1 METs  1.7  -  1.77  1.77  1.77     NuStep   Level  1  -  _2 Minutes  15  -  _3 METs  1.7  -  1.6  1.6  1.6     Biostep-RELP   Level  1  -  _4 Minutes  15  -  _5 METs  2  -  _6 Home Exercise Plan   Plans to continue exercise at  -  Home (comment) walking  Home (comment) walking  Home (comment) walking  Home (comment) walking   Frequency  -  Add 1 additional day to program exercise sessions.  Add 1 additional day to program exercise sessions.  Add 1 additional day to program exercise sessions.  Add 1 additional day to program exercise sessions.   Initial Home Exercises Provided  -  03/28/18  03/28/18  03/28/18  03/28/18   Row Name 05/15/18 0900 05/30/18 1400 06/12/18 1600 06/26/18 1600 07/10/18 1500     Response to Exercise   Blood Pressure (Admit)  104/60  106/80  120/78  120/80  124/66   Blood Pressure (Exit)  108/68  124/62  116/66  112/62  120/82   Heart Rate (Admit)  64 bpm  59 bpm  75 bpm  70 bpm  69 bpm   Heart Rate (Exercise)  71 bpm  102 bpm  67 bpm  95 bpm  90 bpm   Heart Rate (Exit)  84 bpm  58 bpm  59 bpm  91 bpm  68 bpm   Oxygen Saturation (Admit)  8 %  98  %  96 %  95 %  97 %   Oxygen Saturation (Exercise)  97 %  95 %  97 %  88 %  97 %   Oxygen Saturation (Exit)  98 %  98 %  97 %  95 %  100 %   Rating of Perceived Exertion (Exercise)  _7 Perceived Dyspnea (Exercise)  1  0  _8 Symptoms  SOB  none  none  none  none   Duration  Continue with 45 min of aerobic exercise without signs/symptoms of physical distress.  Continue with 45 min of aerobic exercise without signs/symptoms of physical distress.  Continue with 45 min of aerobic exercise without signs/symptoms of physical distress.  Continue with 45 min of aerobic exercise without signs/symptoms of physical distress.  Continue with 45 min of aerobic exercise without signs/symptoms of physical distress.   Intensity  THRR unchanged  THRR unchanged  THRR unchanged  THRR unchanged  THRR unchanged     Progression   Progression  Continue to progress workloads to maintain intensity without signs/symptoms of physical distress.  Continue to progress workloads to maintain intensity without signs/symptoms of physical distress.  Continue to progress workloads to  maintain intensity without signs/symptoms of physical distress.  Continue to progress workloads to maintain intensity without signs/symptoms of physical distress.  Continue to progress workloads to maintain intensity without signs/symptoms of physical distress.   Average METs  1.79  1.96  1.9  1.96  2.03     Resistance Training   Training Prescription  Yes  Yes  Yes  Yes  Yes   Weight  2 lbs  2 lbs  2 lbs  2 lbs  2 lbs   Reps  10-15  10-15  10-15  10-15  10-15     Interval Training   Interval Training  No  No  No  No  No     Treadmill   MPH  1  1.3  1.2  1.3  1.3   Grade  0  0.5  0.5  0.5  0.5   Minutes  _0 METs  1.77  2.09  2  2.09  2.09     NuStep   Level  _1 Minutes  _2 METs  1.6  1.8  1.7  1.8  2     Biostep-RELP   Level  _3 Minutes  _4 METs  _5 Home Exercise Plan   Plans to continue exercise at  Home (comment) walking  Home (comment) walking  Home (comment) walking  Home (comment) walking  Home (comment) walking   Frequency  Add 2 additional days to program exercise sessions.  Add 2 additional days to program exercise sessions.  Add 2 additional days to program exercise sessions.  Add 2 additional days to program exercise sessions.  Add 2 additional days to program exercise sessions.   Initial Home Exercises Provided  03/28/18  03/28/18  03/28/18  03/28/18  03/28/18      Exercise Comments: Exercise Comments    Row Name 03/14/18 1124 07/25/18 1140         Exercise Comments  First full day of exercise!  Patient was oriented to gym and equipment including functions, settings, policies, and procedures.  Patient's individual exercise prescription and treatment plan were reviewed.  All starting workloads were established based on the results of the 6 minute walk test done at initial orientation visit.  The plan for exercise progression was also introduced and progression will be customized based on patient's performance and goals    Cydnee graduated today from  rehab with 36 sessions completed.  Details of the patient's exercise prescription and what She needs to do in order to continue the prescription and progress were discussed with patient.  Patient was given a copy of prescription and goals.  Patient verbalized understanding.  Malay plans to continue to exercise by joining [employer].         Exercise Goals and Review: Exercise Goals    Row Name 03/06/18 1218             Exercise Goals   Increase Physical Activity  Yes       Intervention  Provide advice, education, support and counseling about physical activity/exercise needs.;Develop an individualized exercise prescription for aerobic and resistive training based on initial evaluation findings, risk stratification,  comorbidities and participant's  personal goals.       Expected Outcomes  Short Term: Attend rehab on a regular basis to increase amount of physical activity.;Long Term: Add in home exercise to make exercise part of routine and to increase amount of physical activity.;Long Term: Exercising regularly at least 3-5 days a week.       Increase Strength and Stamina  Yes       Intervention  Provide advice, education, support and counseling about physical activity/exercise needs.;Develop an individualized exercise prescription for aerobic and resistive training based on initial evaluation findings, risk stratification, comorbidities and participant's personal goals.       Expected Outcomes  Short Term: Increase workloads from initial exercise prescription for resistance, speed, and METs.;Short Term: Perform resistance training exercises routinely during rehab and add in resistance training at home;Long Term: Improve cardiorespiratory fitness, muscular endurance and strength as measured by increased METs and functional capacity (6MWT)       Able to understand and use rate of perceived exertion (RPE) scale  Yes       Intervention  Provide education and explanation on how to use RPE scale       Expected Outcomes  Short Term: Able to use RPE daily in rehab to express subjective intensity level;Long Term:  Able to use RPE to guide intensity level when exercising independently       Able to understand and use Dyspnea scale  Yes       Intervention  Provide education and explanation on how to use Dyspnea scale       Expected Outcomes  Short Term: Able to use Dyspnea scale daily in rehab to express subjective sense of shortness of breath during exertion;Long Term: Able to use Dyspnea scale to guide intensity level when exercising independently       Knowledge and understanding of Target Heart Rate Range (THRR)  Yes       Intervention  Provide education and explanation of THRR including how the numbers were predicted and where they are located for  reference       Expected Outcomes  Short Term: Able to state/look up THRR;Short Term: Able to use daily as guideline for intensity in rehab;Long Term: Able to use THRR to govern intensity when exercising independently       Able to check pulse independently  Yes       Intervention  Provide education and demonstration on how to check pulse in carotid and radial arteries.;Review the importance of being able to check your own pulse for safety during independent exercise       Expected Outcomes  Short Term: Able to explain why pulse checking is important during independent exercise;Long Term: Able to check pulse independently and accurately       Understanding of Exercise Prescription  Yes       Intervention  Provide education, explanation, and written materials on patient's individual exercise prescription       Expected Outcomes  Short Term: Able to explain program exercise prescription;Long Term: Able to explain home exercise prescription to exercise independently          Exercise Goals Re-Evaluation : Exercise Goals Re-Evaluation    Row Name 03/14/18 1126 03/20/18 1447 03/28/18 1212 04/02/18 1554 04/18/18 0821     Exercise Goal Re-Evaluation   Exercise Goals Review  Increase Physical Activity;Increase Strength and Stamina;Able to understand and use rate of perceived exertion (RPE) scale;Able to understand and use Dyspnea scale;Understanding of Exercise Prescription;Knowledge and  understanding of Target Heart Rate Range (THRR)  Increase Physical Activity;Increase Strength and Stamina;Understanding of Exercise Prescription  Increase Physical Activity;Increase Strength and Stamina;Understanding of Exercise Prescription  Increase Physical Activity;Increase Strength and Stamina;Understanding of Exercise Prescription  Increase Physical Activity;Increase Strength and Stamina;Understanding of Exercise Prescription   Comments  Reviewed RPE scale, THR and program prescription with pt today.  Pt voiced  understanding and was given a copy of goals to take home.   Ruchy is off to a good start in rehab.  She has completed two full days of exercise.  All of her workloads are between 13-15 on the RPE scale.  We will continue to monitor her progress.   Reviewed home exercise with pt today.  Pt plans to walk at home and stores for exercise.  She is going to start by adding in one extra day at home to get to three days a week.  Reviewed THR, pulse, RPE, sign and symptoms, and when to call 911 or MD.  Also discussed weather considerations and indoor options.  Pt voiced understanding.  Mahoganie has been doing well in rehab.  She is still at baseline workloads, but we will try to incease the NuStep for her. We will continue to monitor her progress.   Lorre continues to do well in rehab.  She is maintain 1.6 METs on the NuStep.  We will try to increase her workloads again.  We will continue to monitor her progression.    Expected Outcomes  Short: Use RPE daily to regulate intensity. Long: Follow program prescription in THR.  Short: Continue to attend rehab regularly.  Long: Continue to follow program prescription.  Short: Add in at least one extra day a week next week.  Long: Start to exercise independently  Short: Increase workload on NuStep.  Long: Continue to increase phsycial activity levels.   Short: Increase workloads.   Long: Continue to increase strength and stamina.    [place] Name 05/02/18 1524 05/15/18 0945 05/30/18 1428 06/12/18 1611 06/26/18 1627     Exercise Goal Re-Evaluation   Exercise Goals Review  Increase Physical Activity;Increase Strength and Stamina;Understanding of Exercise Prescription  Increase Physical Activity;Increase Strength and Stamina;Understanding of Exercise Prescription  Increase Physical Activity;Increase Strength and Stamina;Understanding of Exercise Prescription  Increase Physical Activity;Increase Strength and Stamina;Understanding of Exercise Prescription  Increase Physical Activity;Increase  Strength and Stamina;Understanding of Exercise Prescription   Comments  Maxx continues to do well in rehab. She is up to level 2 on the BioStep.  We will continue to monitor her progression.   Tamecca has been doing well in rehab.   She is now on level 3 for the BioStep.  She continues to work hard in here for her.  We will continue to monitor her progress.   Bailei continues to do well in rehab.  She is now up to 1.3 mph on the treadmill.  We will continue to monitor her progression.   Kyndall continues to work in class. She dropped back down to 1.2 mph and we will work with her to get back up to 1.3 mph.  We will continue to monitor her progress.  Armie has been doing well in rehab.  She is back up to her 1.3 mph again.  She is also doing level 4 on the NuStep.  We will continue to monitor her progression.    Expected Outcomes  Short: Increase workload on NuStep. Long: Continue to improve strength and stamina.   Short: Increase NuStep and treadmill.  Long: Continue to exercise at home on off days.   Short: Continue to move up workloads.  Long: Continue to encourage more activity at home.   Short: Get speed on treadmill back up to 1.3 mph.  Long: Continue to move more at home.   Short: Increase BioStep to level 4.  Long: Continue to increase her physical activity.    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  I expect to see her improve some.  She is planning to continue to exercise by walking at home.  We will continue to monitor her progress.        Expected Outcomes  Short: Improve post 6MWT.  Long: Continue to exercise independently.           Discharge Exercise Prescription (Final Exercise Prescription Changes): Exercise Prescription Changes - 07/10/18 1500      Response to  Exercise   Blood Pressure (Admit)  124/66    Blood Pressure (Exit)  120/82    Heart Rate (Admit)  69 bpm    Heart Rate (Exercise)  90 bpm    Heart Rate (Exit)  68 bpm    Oxygen Saturation (Admit)  97 %    Oxygen Saturation (Exercise)  97 %    Oxygen Saturation (Exit)  100 %    Rating of Perceived Exertion (Exercise)  15    Perceived Dyspnea (Exercise)  1    Symptoms  none    Duration  Continue with 45 min of aerobic exercise without signs/symptoms of physical distress.    Intensity  THRR unchanged      Progression   Progression  Continue to progress workloads to maintain intensity without signs/symptoms of physical distress.    Average METs  2.03      Resistance Training   Training Prescription  Yes    Weight  2 lbs    Reps  10-15      Interval Training   Interval Training  No      Treadmill   MPH  1.3    Grade  0.5    Minutes  15    METs  2.09      NuStep   Level  4    Minutes  15    METs  2      Biostep-RELP   Level  3    Minutes  15    METs  2      Home Exercise Plan   Plans to continue exercise at  Home (comment)   walking   Frequency  Add 2 additional days to program exercise sessions.    Initial Home Exercises Provided  03/28/18       Nutrition:  Target Goals: Understanding of nutrition guidelines, daily intake of sodium <1528m, cholesterol <2060m calories 30% from fat and 7% or less from saturated fats, daily to have 5 or more servings of fruits and vegetables.  Biometrics: Pre Biometrics - 03/06/18 1218      Pre Biometrics   Height  5' (1.524 m)    Weight  142 lb 14.4 oz (64.8 kg)    Waist Circumference  35 inches    Hip Circumference  41.5 inches    Waist to Hip Ratio  0.84 %    BMI (Calculated)  27.91      Post Biometrics - 07/11/18 1247       Post  Biometrics   Height  5' (1.524 m)    Weight  142 lb (64.4 kg)    Waist Circumference  34 inches    Hip Circumference  43 inches    Waist to Hip Ratio  0.79 %    BMI (Calculated)  27.73        Nutrition Therapy Plan and Nutrition Goals: Nutrition Therapy & Goals - 03/19/18 1214      Nutrition Therapy   Diet  TLC    Drug/Food Interactions  Purine/Gout    Protein (specify units)  5-6oz    Fiber  20 grams    Whole Grain Foods  2 servings   3 ideal   Saturated Fats  10 max. grams    Fruits and Vegetables  5 servings/day   8 ideal, eats small portions   Sodium  1500 grams      Personal Nutrition Goals   Nutrition Goal  On days when appetite is decreased, try to snack consistently between meals to help meet nutritional needs and maintain CBW    Personal Goal #2  Continue to monitor fluid and sodium intake r/t CKD as directed by your nephrologist    Comments  States her appetite varies day to day, but her eating pattern is typically a larger breakfast, small lunch and moderate dinner with snacks semi-regularly      Intervention Plan   Intervention  Prescribe, educate and counsel regarding individualized specific dietary modifications aiming towards targeted core components such as weight, hypertension, lipid management, diabetes, heart failure and other comorbidities.    Expected Outcomes  Short Term Goal: Understand basic principles of dietary content, such as calories, fat, sodium, cholesterol and nutrients.;Short Term Goal: A plan has been developed with personal nutrition goals set during dietitian appointment.;Long Term Goal: Adherence to prescribed nutrition plan.       Nutrition Assessments: Nutrition Assessments - 07/16/18 1513      MEDFICTS Scores   Pre Score  36    Post Score  35    Score Difference  -1       Nutrition Goals Re-Evaluation: Nutrition Goals Re-Evaluation    Row Name 03/19/18 1227 04/11/18 1541 05/02/18 1226 06/25/18 1419       Goals   Nutrition Goal  On days when appetite is decreased, try to snack consistently between meals to help meet nutritional needs and maintain CBW  Patient wants to maintain her weight and make healthier choices.   On days when appetite is decreased, try to add in snacks in order to help meet daily nutritional needs; continue to monitor sodium as directed by nephrologist; maintain CBW  On days when appetite is decreased try to incorporate snacks to help meet daily nutritional needs; continue to monitor fluid and sodium intake as directed by nephrologist; maintain CBW +/-5#    Comment  Reports her appeitite to vary day-to-day but feels that she tries to eat a variety of foods  Sometimes her appetite is good and sometimes she is not so hungry. She tries not to over eat.  She reports appetite to be better recently. She is snacking and eats sources of protein daily. Some common foods include nuts, fruit and chicken. She monitors sodium intake regularly  She feels she is maintaining her weight and that she is eating snacks when she feels up to it.  Appetite still varies day to day and may be effected by sleep quality. She is trying to stay active and eat a variety of foods    Expected Outcome  She will eat on a consistent schedule each day, prioritizing total calories and protein intake  Short: make healthy eating choices when hungry. Long: maintain weight independently following dieticians reccomendations.  Maintain CBW and practice a regular eating schedule to meet energy needs. Continue to monitor and moderate sodium intake  Maintain CBW +/-5# and aim to eat on a consistent schedule and to choose a variety of foods each day      Personal Goal #2 Re-Evaluation   Personal Goal #2  Continue to monitor fluid and sodium intake r/t CKD as directed by your nephrologist  -  -  -       Nutrition Goals Discharge (Final Nutrition Goals Re-Evaluation): Nutrition Goals Re-Evaluation - 06/25/18 1419      Goals   Nutrition Goal  On days when appetite is decreased try to incorporate snacks to help meet daily nutritional needs; continue to monitor fluid and sodium intake as directed by nephrologist; maintain CBW +/-5#    Comment   She feels she is maintaining her weight and that she is eating snacks when she feels up to it. Appetite still varies day to day and may be effected by sleep quality. She is trying to stay active and eat a variety of foods    Expected Outcome  Maintain CBW +/-5# and aim to eat on a consistent schedule and to choose a variety of foods each day       Psychosocial: Target Goals: Acknowledge presence or absence of significant depression and/or stress, maximize coping skills, provide positive support system. Participant is able to verbalize types and ability to use techniques and skills needed for reducing stress and depression.   Initial Review & Psychosocial Screening: Initial Psych Review & Screening - 03/06/18 1135      Initial Review   Current issues with  Current Sleep Concerns;Current Stress Concerns    Source of Stress Concerns  Chronic Illness    Comments  She gets short of breath walking.      Family Dynamics   Good Support System?  Yes    Comments  She can look to her daugher Tammy for support      Barriers   Psychosocial barriers to participate in program  The patient should benefit from training in stress management and relaxation.      Screening Interventions   Interventions  Encouraged to exercise;Provide feedback about the scores to participant;Program counselor consult;To provide support and resources with identified psychosocial needs    Expected Outcomes  Short Term goal: Utilizing psychosocial counselor, staff and physician to assist with identification of specific Stressors or current issues interfering with healing process. Setting desired goal for each stressor or current issue identified.;Long Term Goal: Stressors or current issues are controlled or eliminated.;Short Term goal: Identification and review with participant of any Quality of Life or Depression concerns found by scoring the questionnaire.;Long Term goal: The participant improves quality of Life and PHQ9 Scores  as seen by post scores and/or verbalization of changes       Quality of Life Scores:  Scores of 19 and below usually indicate a poorer quality of life in these areas.  A difference of  2-3 points is a clinically meaningful difference.  A difference of 2-3 points in the total score of the Quality of Life Index has  been associated with significant improvement in overall quality of life, self-image, physical symptoms, and general health in studies assessing change in quality of life.  PHQ-9: Recent Review Flowsheet Data    Depression screen [facility] 2/9 07/16/2018 03/08/2018 03/06/2018 09/28/2017 07/26/2016   Decreased Interest 0 0 1 1 0   Down, Depressed, Hopeless 0 _0 0   PHQ - 2 Score 0 _1 0   Altered sleeping _2 -   Tired, decreased energy _3 -   Change in appetite 0 _4 -   Feeling bad or failure about yourself  0 0 0 0 -   Trouble concentrating 0 0 0 0 -   Moving slowly or fidgety/restless 0 0 0 0 -   Suicidal thoughts 0 0 0 0 -   PHQ-9 Score _5 -   Difficult doing work/chores Not difficult at all Not difficult at all Not difficult at all Somewhat difficult -     Interpretation of Total Score  Total Score Depression Severity:  1-4 = Minimal depression, 5-9 = Mild depression, 10-14 = Moderate depression, 15-19 = Moderately severe depression, 20-27 = Severe depression   Psychosocial Evaluation and Intervention: Psychosocial Evaluation - 03/14/18 1230      Psychosocial Evaluation & Interventions   Interventions  Encouraged to exercise with the program and follow exercise prescription;Stress management education    Comments  Counselor met with Ms. Enid Derry today Romie Minus) for initial psychsocial evaluation.  She is an 63 (almost 69) year old who has COPD.  Although Kadance lives alone she has a strong support system with a daughter; close friends and church family who are very involved in her life.  In addition to her pulmonary condition, Otha mentioned a heart and kidney  condition - but was unclear about details on those.  She reports sleeping "fair" with ~6 hours most nights of the week.  She is on medication that helps with this.  Todd reports her appetite has been up and down lately.  She states that anxiety symptoms have been a problem more recently and she was put on medication by her Dr. that "made me sick."  So it was discontinued about a month ago.  She is typically in a positive mood most of the time and reports minimal stress other than her health and living alone - caring for the house, etc by herself.  Aayat became tearful today as she mentioned the loss of one of her daughter approximately 18 months ago to Bone Cancer.  She relies on her faith and her support system to help her through this.  Miski has goals to walk and breathe better while in this program.  Staff will follow with her.    Expected Outcomes  Short:  Chelsy will exercise consistently for her stamina and strength and to help with sleep and stress management   Long:  Niva will develop a routine of exercise to have a better quality of life.      Continue Psychosocial Services   Follow up required by staff       Psychosocial Re-Evaluation: Psychosocial Re-Evaluation    [place] Name 04/11/18 1535 05/23/18 1225 06/22/18 1227 07/18/18 1443       Psychosocial Re-Evaluation   Current issues with  Current Sleep Concerns;Current Stress Concerns  Current Sleep Concerns;Current Stress Concerns  Current Stress Concerns  Current Stress Concerns    Comments  Patient has improved slightly with  her shortness of breath. She went to the beach recently with her friends and had a great time eating crab. Her birthday is tomorrow and she will be 60. She is a happer person and wants to be in shape.  Makailyn states that her sleeping is better due to an anti anxiety pill that she is on. She was going to bed and then having alot on her mind and could not fall asleep. Now she gets to sleep easier and is doing better. She feels  better in general as well. She will sometimes think about her daughter that has passed away and that stresses her out. The holiday is hard for her when the weather gets cold.  Patient has a good overall mental health state when she is participating in [place]. She has been working hard on her exercises and was informed to continue post [facility] to maintain a healthy state of mind.   Patient states there is no changes in mental health and will continue to exercise after Pulmonary Rehab program joining the [facility].    Expected Outcomes  Short: Attend [facility] stress management education to decrease stress. Long: Maintain exercise Post [facility] to keep stress at a minimum.  short: consult with the mental health conselor on staff. Long: take psychosocial and stress managment classes.  Short: Attend [facility] stress management education to decrease stress. Long: Maintain exercise Post [facility] to keep stress at a minimum.  Short: join the [facility] program to decrease stress. Long: Maintain exercise to keep stress at a minimum.    Interventions  Encouraged to attend Pulmonary Rehabilitation for the exercise  Encouraged to attend Pulmonary Rehabilitation for the exercise  Encouraged to attend Pulmonary Rehabilitation for the exercise  Encouraged to attend Pulmonary Rehabilitation for the exercise    Continue Psychosocial Services   Follow up required by staff  Follow up required by staff  Follow up required by staff  Follow up required by staff       Psychosocial Discharge (Final Psychosocial Re-Evaluation): Psychosocial Re-Evaluation - 07/18/18 1443      Psychosocial Re-Evaluation   Current issues with  Current Stress Concerns    Comments  Patient states there is no changes in mental health and will continue to exercise after Pulmonary Rehab program joining [facility].    Expected Outcomes  Short: join the [facility] program to decrease stress. Long: Maintain exercise to  keep stress at a minimum.    Interventions  Encouraged to attend Pulmonary Rehabilitation for the exercise    Continue Psychosocial Services   Follow up required by staff       Education: Education Goals: Education classes will be provided on a weekly basis, covering required topics. Participant will state understanding/return demonstration of topics presented.  Learning Barriers/Preferences: Learning Barriers/Preferences - 03/06/18 1145      Learning Barriers/Preferences   Learning Barriers  Sight   wears glasses   Learning Preferences  None       Education Topics:  Initial Evaluation Education: - Verbal, written and demonstration of respiratory meds, oximetry and breathing techniques. Instruction on use of nebulizers and MDIs and importance of monitoring MDI activations.   Pulmonary Rehab from 07/25/2018 in [facility] Cardiac and Pulmonary Rehab  Date  03/06/18  Educator  [facility]  Instruction Review Code  1- Verbalizes Understanding      General Nutrition Guidelines/Fats and Fiber: -Group instruction provided by verbal, written material, models and posters to present the general guidelines for heart healthy  nutrition. Gives an explanation and review of dietary fats and fiber.   Pulmonary Rehab from 07/25/2018 in [facility] Cardiac and Pulmonary Rehab  Date  06/13/18  Educator  [facility]  Instruction Review Code  1- Verbalizes Understanding      Controlling Sodium/Reading Food Labels: -Group verbal and written material supporting the discussion of sodium use in heart healthy nutrition. Review and explanation with models, verbal and written materials for utilization of the food label.   Pulmonary Rehab from 07/25/2018 in [facility] Cardiac and Pulmonary Rehab  Date  06/27/18  Educator  [facility]  Instruction Review Code  1- Verbalizes Understanding      Exercise Physiology & General Exercise Guidelines: - Group verbal and written instruction with models to review the exercise physiology of the  cardiovascular system and associated critical values. Provides general exercise guidelines with specific guidelines to those with heart or lung disease.    Pulmonary Rehab from 07/25/2018 in [facility] Cardiac and Pulmonary Rehab  Date  05/02/18  Educator  [facility]  Instruction Review Code  1- Verbalizes Understanding      Aerobic Exercise & Resistance Training: - Gives group verbal and written instruction on the various components of exercise. Focuses on aerobic and resistive training programs and the benefits of this training and how to safely progress through these programs.   Pulmonary Rehab from 07/25/2018 in [facility] Cardiac and Pulmonary Rehab  Date  05/16/18  Educator  [facility]  Instruction Review Code  1- Verbalizes Understanding      Flexibility, Balance, Mind/Body Relaxation: Provides group verbal/written instruction on the benefits of flexibility and balance training, including mind/body exercise modes such as yoga, pilates and tai chi.  Demonstration and skill practice provided.   Pulmonary Rehab from 07/25/2018 in [facility] Cardiac and Pulmonary Rehab  Date  05/30/18  Educator  [facility]  Instruction Review Code  1- Verbalizes Understanding      Stress and Anxiety: - Provides group verbal and written instruction about the health risks of elevated stress and causes of high stress.  Discuss the correlation between heart/lung disease and anxiety and treatment options. Review healthy ways to manage with stress and anxiety.   Pulmonary Rehab from 07/25/2018 in [facility] Cardiac and Pulmonary Rehab  Date  03/14/18  Educator  [facility]  Instruction Review Code  1- Verbalizes Understanding      Depression: - Provides group verbal and written instruction on the correlation between heart/lung disease and depressed mood, treatment options, and the stigmas associated with seeking treatment.   Pulmonary Rehab from 07/25/2018 in [facility] Cardiac and Pulmonary Rehab  Date  05/23/18  Educator  [facility]  Instruction Review Code  1-  Verbalizes Understanding      Exercise & Equipment Safety: - Individual verbal instruction and demonstration of equipment use and safety with use of the equipment.   Pulmonary Rehab from 07/25/2018 in [facility] Cardiac and Pulmonary Rehab  Date  03/06/18  Educator  [facility]  Instruction Review Code  1- Verbalizes Understanding      Infection Prevention: - Provides verbal and written material to individual with discussion of infection control including proper hand washing and proper equipment cleaning during exercise session.   Pulmonary Rehab from 07/25/2018 in [facility] Cardiac and Pulmonary Rehab  Date  03/06/18  Educator  [facility]  Instruction Review Code  1- Verbalizes Understanding      Falls Prevention: - Provides verbal and written material to individual with discussion of falls prevention and safety.   Pulmonary Rehab from 07/25/2018 in [facility] Cardiac and  Pulmonary Rehab  Date  03/06/18  Educator  [facility]  Instruction Review Code  1- Verbalizes Understanding      Diabetes: - Individual verbal and written instruction to review signs/symptoms of diabetes, desired ranges of glucose level fasting, after meals and with exercise. Advice that pre and post exercise glucose checks will be done for 3 sessions at entry of program.   Chronic Lung Diseases: - Group verbal and written instruction to review updates, respiratory medications, advancements in procedures and treatments. Discuss use of supplemental oxygen including available portable oxygen systems, continuous and intermittent flow rates, concentrators, personal use and safety guidelines. Review proper use of inhaler and spacers. Provide informative websites for self-education.    Pulmonary Rehab from 07/25/2018 in [facility] Cardiac and Pulmonary Rehab  Date  07/11/18  Educator  [facility]  Instruction Review Code  1- Verbalizes Understanding      Energy Conservation: - Provide group verbal and written instruction for methods to conserve energy, plan and  organize activities. Instruct on pacing techniques, use of adaptive equipment and posture/positioning to relieve shortness of breath.   Pulmonary Rehab from 07/25/2018 in [facility] Cardiac and Pulmonary Rehab  Date  06/06/18  Educator  [facility]  Instruction Review Code  1- Verbalizes Understanding      Triggers and Exacerbations: - Group verbal and written instruction to review types of environmental triggers and ways to prevent exacerbations. Discuss weather changes, air quality and the benefits of nasal washing. Review warning signs and symptoms to help prevent infections. Discuss techniques for effective airway clearance, coughing, and vibrations.   Pulmonary Rehab from 07/25/2018 in [facility] Cardiac and Pulmonary Rehab  Date  05/09/18  Educator  [facility]  Instruction Review Code  1- Verbalizes Understanding      AED/CPR: - Group verbal and written instruction with the use of models to demonstrate the basic use of the AED with the basic ABC's of resuscitation.   Anatomy and Physiology of the Lungs: - Group verbal and written instruction with the use of models to provide basic lung anatomy and physiology related to function, structure and complications of lung disease.   Anatomy & Physiology of the Heart: - Group verbal and written instruction and models provide basic cardiac anatomy and physiology, with the coronary electrical and arterial systems. Review of Valvular disease and Heart Failure   Cardiac Medications: - Group verbal and written instruction to review commonly prescribed medications for heart disease. Reviews the medication, class of the drug, and side effects.   Pulmonary Rehab from 07/25/2018 in [facility] Cardiac and Pulmonary Rehab  Date  03/21/18  Educator  [facility]  Instruction Review Code  1- Verbalizes Understanding      Know Your Numbers and Risk Factors: -Group verbal and written instruction about important numbers in your health.  Discussion of what are risk factors and how they play  a role in the disease process.  Review of Cholesterol, Blood Pressure, Diabetes, and BMI and the role they play in your overall health.   Pulmonary Rehab from 07/25/2018 in [facility] Cardiac and Pulmonary Rehab  Date  07/25/18  Educator  [facility]  Instruction Review Code  1- Verbalizes Understanding      Sleep Hygiene: -Provides group verbal and written instruction about how sleep can affect your health.  Define sleep hygiene, discuss sleep cycles and impact of sleep habits. Review good sleep hygiene tips.    Pulmonary Rehab from 07/25/2018 in [facility] Cardiac and Pulmonary Rehab  Date  07/18/18  Educator  [facility]  Instruction  Review Code  1- Verbalizes Understanding      Other: -Provides group and verbal instruction on various topics (see comments)    Knowledge Questionnaire Score: Knowledge Questionnaire Score - 07/16/18 1512      Knowledge Questionnaire Score   Pre Score  11/18    Post Score  17/18   reviewed with patient       Core Components/Risk Factors/Patient Goals at Admission: Personal Goals and Risk Factors at Admission - 03/06/18 1146      Core Components/Risk Factors/Patient Goals on Admission    Weight Management  Yes;Weight Maintenance;Weight Loss    Intervention  Weight Management: Develop a combined nutrition and exercise program designed to reach desired caloric intake, while maintaining appropriate intake of nutrient and fiber, sodium and fats, and appropriate energy expenditure required for the weight goal.;Weight Management: Provide education and appropriate resources to help participant work on and attain dietary goals.;Weight Management/Obesity: Establish reasonable short term and long term weight goals.    Admit Weight  142 lb 14.4 oz (64.8 kg)    Goal Weight: Short Term  135 lb (61.2 kg)    Goal Weight: Long Term  135 lb (61.2 kg)    Expected Outcomes  Short Term: Continue to assess and modify interventions until short term weight is achieved;Long Term: Adherence to  nutrition and physical activity/exercise program aimed toward attainment of established weight goal;Weight Maintenance: Understanding of the daily nutrition guidelines, which includes 25-35% calories from fat, 7% or less cal from saturated fats, less than 267m cholesterol, less than 1.5gm of sodium, & 5 or more servings of fruits and vegetables daily;Understanding recommendations for meals to include 15-35% energy as protein, 25-35% energy from fat, 35-60% energy from carbohydrates, less than 2059mof dietary cholesterol, 20-35 gm of total fiber daily;Understanding of distribution of calorie intake throughout the day with the consumption of 4-5 meals/snacks;Weight Loss: Understanding of general recommendations for a balanced deficit meal plan, which promotes 1-2 lb weight loss per week and includes a negative energy balance of [phone] kcal/d    Improve shortness of breath with ADL's  Yes    Intervention  Provide education, individualized exercise plan and daily activity instruction to help decrease symptoms of SOB with activities of daily living.    Expected Outcomes  Short Term: Improve cardiorespiratory fitness to achieve a reduction of symptoms when performing ADLs;Long Term: Be able to perform more ADLs without symptoms or delay the onset of symptoms    Hypertension  Yes    Intervention  Provide education on lifestyle modifcations including regular physical activity/exercise, weight management, moderate sodium restriction and increased consumption of fresh fruit, vegetables, and low fat dairy, alcohol moderation, and smoking cessation.;Monitor prescription use compliance.    Expected Outcomes  Short Term: Continued assessment and intervention until BP is < 140/9069mG in hypertensive participants. < 130/30m73m in hypertensive participants with diabetes, heart failure or chronic kidney disease.;Long Term: Maintenance of blood pressure at goal levels.       Core Components/Risk Factors/Patient Goals  Review:  Goals and Risk Factor Review    Row Name 04/11/18 1543 05/23/18 1229 06/22/18 1224 07/18/18 1445       Core Components/Risk Factors/Patient Goals Review   Personal Goals Review  Weight Management/Obesity;Hypertension;Improve shortness of breath with ADL's  Weight Management/Obesity;Hypertension;Improve shortness of breath with ADL's  Weight Management/Obesity;Hypertension;Improve shortness of breath with ADL's  Weight Management/Obesity;Hypertension;Improve shortness of breath with ADL's    Review  Patient is working on losing a few pounds then  maintaining her weight. Her blood pressure has been better since she started the program. Her shortness of breath has improved slightly. She has been walking at [employer] and at home. She still gets short of breath when she walks for a long time. Patient states she wants to be able to walk further without getting short of breath.  -  [place] blood pressure has been improving since the start of the program. [place] is trying to stay busy and complete the program.  Patient is going to join [employer] to maintain her exercise. She is to graduate the [place] program next week. She wants to continue to improve her strength after graduating.    Expected Outcomes  Short: Attend [facility] regularly to improve shortness of breath with ADL's. Long: maintain independence with ADL's   -  Short: graduate [place]. Long: maintain exercise independently.  Short: graduate [place]. Long: join [employer] and maintain exercise routine       Core Components/Risk Factors/Patient Goals at Discharge (Final Review):  Goals and Risk Factor Review - 07/18/18 1445      Core Components/Risk Factors/Patient Goals Review   Personal Goals Review  Weight Management/Obesity;Hypertension;Improve shortness of breath with ADL's    Review  Patient is going to join [employer] to maintain her exercise. She is to graduate the [place] program next week. She wants to continue to improve  her strength after graduating.    Expected Outcomes  Short: graduate [place]. Long: join [employer] and maintain exercise routine       ITP Comments: ITP Comments    Row Name 03/06/18 1110 04/02/18 0842 04/30/18 0847 05/28/18 0905 06/25/18 0848   ITP Comments  Medical Evaluation completed. Chart sent for review and changes to Dr. Emily Filbert Director of [place]. Diagnosis can be found in [facility] encounter 08/14/17  30 day review completed. ITP sent to Dr. Emily Filbert Director of [place]. Continue with ITP unless changes are made by physician  30 day review completed. ITP sent to Dr. Emily Filbert Director of [place]. Continue with ITP unless changes are made by physician  30 day review completed. ITP sent to Dr. Emily Filbert Director of [place]. Continue with ITP unless changes are made by physician.  30 day review completed. ITP sent to Dr. Emily Filbert Director of [place]. Continue with ITP unless changes are made by physician.   [place] Name 07/23/18 0855 07/25/18 1140         ITP Comments  30 day review completed. ITP sent to Dr. Emily Filbert Director of [place]. Continue with ITP unless changes are made by physician.  Discharge ITP sent and signed by Dr. Sabra Heck.  Discharge Summary routed to PCP and pulmonologist         Comments: Discharge ITP

## 2018-07-25 NOTE — Progress Notes (Signed)
Daily Session Note  Patient Details  Name: Savannah Wilson MRN: [id number] Date of Birth: [birth year] Referring Provider:     Pulmonary Rehab from 03/06/2018 in [facility] Cardiac and Pulmonary Rehab  Referring Provider  Savannah Wilson      Encounter Date: 07/25/2018  Check In: Session Check In - 07/25/18 1121      Check-In   Supervising physician immediately available to respond to emergencies  [facility] immediately available ER MD    Physician(s)  Drs. Alyse Low    Location  [facility]-Cardiac & Pulmonary Rehab    Staff Present  Alberteen Sam, MA, RCEP, CCRP, Exercise Physiologist;Joseph Alcus Dad, RN BSN    Medication changes reported      No    Fall or balance concerns reported     Yes    Comments  [place] getting out of bed today.  Arm is sore.     Warm-up and Cool-down  Performed as group-led Higher education careers adviser Performed  Yes    VAD Patient?  No    PAD/SET Patient?  No      Pain Assessment   Currently in Pain?  No/denies          Social History   Tobacco Use  Smoking Status Never Smoker  Smokeless Tobacco Never Used    Goals Met:  [place]per associated with RPD/PD & O2 Sat Independence with exercise equipment Using PLB without cueing & demonstrates good technique Exercise tolerated well Personal goals reviewed No report of cardiac concerns or symptoms Strength training completed today  Goals Unmet:  Not Applicable  Comments:  Savannah Wilson graduated today from  rehab with 36 sessions completed.  Details of the patient's exercise prescription and what She needs to do in order to continue the prescription and progress were discussed with patient.  Patient was given a copy of prescription and goals.  Patient verbalized understanding.  Savannah Wilson plans to continue to exercise by joining [employer].    Dr. Emily Filbert is Medical Director for [place] and [facility].

## 2018-07-25 NOTE — Progress Notes (Signed)
Discharge Progress Report  Patient Details  Name: Savannah Wilson MRN: [id number] Date of Birth: June 29, 1929 Referring Provider:     Pulmonary Rehab from 03/06/2018 in [facility]  Referring Provider  [name]       Number of Visits: 38  Reason for Discharge:  Patient reached a stable level of exercise. Patient independent in their exercise. Patient has met program and personal goals.  Smoking History:  Social History   Tobacco Use  Smoking Status Never Smoker  Smokeless Tobacco Never Used    Diagnosis:  Dyspnea on exertion  ADL UCSD: Pulmonary Assessment Scores    Row Name 03/06/18 1112 07/11/18 1247 07/16/18 1511     ADL UCSD   ADL Phase  Entry  Exit  Exit   SOB Score total  55  -  37   Rest  0  -  0   Walk  2  -  1   Stairs  4  -  3   Bath  1  -  2   Dress  1  -  2   Shop  4  -  2     CAT Score   CAT Score  17  -  13     mMRC Score   mMRC Score  2  1  -      Initial Exercise Prescription: Initial Exercise Prescription - 03/06/18 1200      Date of Initial Exercise RX and Referring Provider   Date  03/06/18    Referring Provider  [name]      Treadmill   MPH  1    Grade  0    Minutes  14    METs  1.7      NuStep   Level  1    SPM  80    Minutes  15    METs  1.5      Biostep-RELP   Level  1    SPM  50    Minutes  15    METs  1.5      Prescription Details   Frequency (times per week)  2    Duration  Progress to 45 minutes of aerobic exercise without signs/symptoms of physical distress      Intensity   THRR 40-80% of Max Heartrate  94-121    Ratings of Perceived Exertion  11-13    Perceived Dyspnea  0-4      Resistance Training   Training Prescription  Yes    Weight  2 lb    Reps  10-15       Discharge Exercise Prescription (Final Exercise Prescription Changes): Exercise Prescription Changes - 07/10/18 1500      Response to Exercise   Blood Pressure (Admit)  124/66    Blood Pressure (Exit)  120/82    Heart  Rate (Admit)  69 bpm    Heart Rate (Exercise)  90 bpm    Heart Rate (Exit)  68 bpm    Oxygen Saturation (Admit)  97 %    Oxygen Saturation (Exercise)  97 %    Oxygen Saturation (Exit)  100 %    Rating of Perceived Exertion (Exercise)  15    Perceived Dyspnea (Exercise)  1    Symptoms  none    Duration  Continue with 45 min of aerobic exercise without signs/symptoms of physical distress.    Intensity  THRR unchanged      Progression   Progression  Continue to progress workloads to maintain intensity without signs/symptoms of physical distress.    Average METs  2.03      Resistance Training   Training Prescription  Yes    Weight  2 lbs    Reps  10-15      Interval Training   Interval Training  No      Treadmill   MPH  1.3    Grade  0.5    Minutes  15    METs  2.09      NuStep   Level  4    Minutes  15    METs  2      Biostep-RELP   Level  3    Minutes  15    METs  2      Home Exercise Plan   Plans to continue exercise at  Home (comment)   walking   Frequency  Add 2 additional days to program exercise sessions.    Initial Home Exercises Provided  03/28/18       Functional Capacity: 6 Minute Walk    Row Name 03/06/18 1219 07/11/18 1244       6 Minute Walk   Phase  -  Discharge    Distance  610 feet  815 feet    Distance % Change  -  33.6 %    Distance Feet Change  -  205 ft    Walk Time  5.6 minutes  6 minutes    # of Rest Breaks  1  0    MPH  1.23  1.54    METS  0.6  1.34    RPE  13  13    Perceived Dyspnea   3  1    VO2 Peak  2.1  4.69    Symptoms  No  No    Resting HR  67 bpm  67 bpm    Resting BP  138/68  122/60    Resting Oxygen Saturation   97 %  95 %    Exercise Oxygen Saturation  during 6 min walk  95 %  93 %    Max Ex. HR  92 bpm  114 bpm    Max Ex. BP  138/60  158/72    2 Minute Post BP  130/64  136/74      Interval HR   1 Minute HR  86  90    2 Minute HR  89  92    3 Minute HR  92  94    4 Minute HR  84  101    5 Minute HR  88  114     6 Minute HR  88  102    2 Minute Post HR  64  72    Interval Heart Rate?  Yes  Yes      Interval Oxygen   Interval Oxygen?  Yes  Yes    Baseline Oxygen Saturation %  97 %  95 %    1 Minute Oxygen Saturation %  95 %  94 %    1 Minute Liters of Oxygen  0 L  0 L Room Air    2 Minute Oxygen Saturation %  95 %  94 %    2 Minute Liters of Oxygen  0 L  0 L    3 Minute Oxygen Saturation %  96 %  95 %    3 Minute Liters of Oxygen  0 L  0  L    4 Minute Oxygen Saturation %  96 %  94 %    4 Minute Liters of Oxygen  0 L  0 L    5 Minute Oxygen Saturation %  95 %  93 %    5 Minute Liters of Oxygen  0 L  0 L    6 Minute Oxygen Saturation %  96 %  93 %    6 Minute Liters of Oxygen  0 L  0 L    2 Minute Post Oxygen Saturation %  97 %  96 %    2 Minute Post Liters of Oxygen  0 L  0 L       Psychological, QOL, Others - Outcomes: PHQ 2/9: Depression screen [facility] 2/9 07/16/2018 03/08/2018 03/06/2018 09/28/2017 07/26/2016  Decreased Interest 0 0 1 1 0  Down, Depressed, Hopeless 0 _0 0  PHQ - 2 Score 0 _1 0  Altered sleeping _2 -  Tired, decreased energy _3 -  Change in appetite 0 _4 -  Feeling bad or failure about yourself  0 0 0 0 -  Trouble concentrating 0 0 0 0 -  Moving slowly or fidgety/restless 0 0 0 0 -  Suicidal thoughts 0 0 0 0 -  PHQ-9 Score _5 -  Difficult doing work/chores Not difficult at all Not difficult at all Not difficult at all Somewhat difficult -  Some recent data might be hidden    Quality of Life:   Personal Goals: Goals established at orientation with interventions provided to work toward goal. Personal Goals and Risk Factors at Admission - 03/06/18 1146      Core Components/Risk Factors/Patient Goals on Admission    Weight Management  Yes;Weight Maintenance;Weight Loss    Intervention  Weight Management: Develop a combined nutrition and exercise program designed to reach desired caloric intake, while maintaining appropriate intake of  nutrient and fiber, sodium and fats, and appropriate energy expenditure required for the weight goal.;Weight Management: Provide education and appropriate resources to help participant work on and attain dietary goals.;Weight Management/Obesity: Establish reasonable short term and long term weight goals.    Admit Weight  142 lb 14.4 oz (64.8 kg)    Goal Weight: Short Term  135 lb (61.2 kg)    Goal Weight: Long Term  135 lb (61.2 kg)    Expected Outcomes  Short Term: Continue to assess and modify interventions until short term weight is achieved;Long Term: Adherence to nutrition and physical activity/exercise program aimed toward attainment of established weight goal;Weight Maintenance: Understanding of the daily nutrition guidelines, which includes 25-35% calories from fat, 7% or less cal from saturated fats, less than 256m cholesterol, less than 1.5gm of sodium, & 5 or more servings of fruits and vegetables daily;Understanding recommendations for meals to include 15-35% energy as protein, 25-35% energy from fat, 35-60% energy from carbohydrates, less than 2078mof dietary cholesterol, 20-35 gm of total fiber daily;Understanding of distribution of calorie intake throughout the day with the consumption of 4-5 meals/snacks;Weight Loss: Understanding of general recommendations for a balanced deficit meal plan, which promotes 1-2 lb weight loss per week and includes a negative energy balance of [phone] kcal/d    Improve shortness of breath with ADL's  Yes    Intervention  Provide education, individualized exercise plan and daily activity instruction to help decrease symptoms of SOB with activities of daily living.  Expected Outcomes  Short Term: Improve cardiorespiratory fitness to achieve a reduction of symptoms when performing ADLs;Long Term: Be able to perform more ADLs without symptoms or delay the onset of symptoms    Hypertension  Yes    Intervention  Provide education on lifestyle modifcations  including regular physical activity/exercise, weight management, moderate sodium restriction and increased consumption of fresh fruit, vegetables, and low fat dairy, alcohol moderation, and smoking cessation.;Monitor prescription use compliance.    Expected Outcomes  Short Term: Continued assessment and intervention until BP is < 140/19m HG in hypertensive participants. < 130/872mHG in hypertensive participants with diabetes, heart failure or chronic kidney disease.;Long Term: Maintenance of blood pressure at goal levels.        Personal Goals Discharge: Goals and Risk Factor Review    Row Name 04/11/18 1543 05/23/18 1229 06/22/18 1224 07/18/18 1445       Core Components/Risk Factors/Patient Goals Review   Personal Goals Review  Weight Management/Obesity;Hypertension;Improve shortness of breath with ADL's  Weight Management/Obesity;Hypertension;Improve shortness of breath with ADL's  Weight Management/Obesity;Hypertension;Improve shortness of breath with ADL's  Weight Management/Obesity;Hypertension;Improve shortness of breath with ADL's    Review  Patient is working on losing a few pounds then maintaining her weight. Her blood pressure has been better since she started the program. Her shortness of breath has improved slightly. She has been walking at [employer] at home. She still gets short of breath when she walks for a long time. Patient states she wants to be able to walk further without getting short of breath.  -  Patiens blood pressure has been improving since the start of the program. JeJanalyns trying to stay busy and complete the program.  Patient is going to join [employer] maintain her exercise. She is to graduate the [employer] next week. She wants to continue to improve her strength after graduating.    Expected Outcomes  Short: Attend [facility] regularly to improve shortness of breath with ADL's. Long: maintain independence with ADL's   -  Short: [facility] [employer]:  maintain exercise independently.  Short: graduate [employer]: join [employer] and maintain exercise routine       Exercise Goals and Review: Exercise Goals    Row Name 03/06/18 1218             Exercise Goals   Increase Physical Activity  Yes       Intervention  Provide advice, education, support and counseling about physical activity/exercise needs.;Develop an individualized exercise prescription for aerobic and resistive training based on initial evaluation findings, risk stratification, comorbidities and participant's personal goals.       Expected Outcomes  Short Term: Attend rehab on a regular basis to increase amount of physical activity.;Long Term: Add in home exercise to make exercise part of routine and to increase amount of physical activity.;Long Term: Exercising regularly at least 3-5 days a week.       Increase Strength and Stamina  Yes       Intervention  Provide advice, education, support and counseling about physical activity/exercise needs.;Develop an individualized exercise prescription for aerobic and resistive training based on initial evaluation findings, risk stratification, comorbidities and participant's personal goals.       Expected Outcomes  Short Term: Increase workloads from initial exercise prescription for resistance, speed, and METs.;Short Term: Perform resistance training exercises routinely during rehab and add in resistance training at home;Long Term: Improve cardiorespiratory fitness, muscular endurance and strength as measured by increased METs  and functional capacity (6MWT)       Able to understand and use rate of perceived exertion (RPE) scale  Yes       Intervention  Provide education and explanation on how to use RPE scale       Expected Outcomes  Short Term: Able to use RPE daily in rehab to express subjective intensity level;Long Term:  Able to use RPE to guide intensity level when exercising independently       Able to understand and use Dyspnea  scale  Yes       Intervention  Provide education and explanation on how to use Dyspnea scale       Expected Outcomes  Short Term: Able to use Dyspnea scale daily in rehab to express subjective sense of shortness of breath during exertion;Long Term: Able to use Dyspnea scale to guide intensity level when exercising independently       Knowledge and understanding of Target Heart Rate Range (THRR)  Yes       Intervention  Provide education and explanation of THRR including how the numbers were predicted and where they are located for reference       Expected Outcomes  Short Term: Able to state/look up THRR;Short Term: Able to use daily as guideline for intensity in rehab;Long Term: Able to use THRR to govern intensity when exercising independently       Able to check pulse independently  Yes       Intervention  Provide education and demonstration on how to check pulse in carotid and radial arteries.;Review the importance of being able to check your own pulse for safety during independent exercise       Expected Outcomes  Short Term: Able to explain why pulse checking is important during independent exercise;Long Term: Able to check pulse independently and accurately       Understanding of Exercise Prescription  Yes       Intervention  Provide education, explanation, and written materials on patient's individual exercise prescription       Expected Outcomes  Short Term: Able to explain program exercise prescription;Long Term: Able to explain home exercise prescription to exercise independently          Exercise Goals Re-Evaluation: Exercise Goals Re-Evaluation    Row Name 03/14/18 1126 03/20/18 1447 03/28/18 1212 04/02/18 1554 04/18/18 0821     Exercise Goal Re-Evaluation   Exercise Goals Review  Increase Physical Activity;Increase Strength and Stamina;Able to understand and use rate of perceived exertion (RPE) scale;Able to understand and use Dyspnea scale;Understanding of Exercise  Prescription;Knowledge and understanding of Target Heart Rate Range (THRR)  Increase Physical Activity;Increase Strength and Stamina;Understanding of Exercise Prescription  Increase Physical Activity;Increase Strength and Stamina;Understanding of Exercise Prescription  Increase Physical Activity;Increase Strength and Stamina;Understanding of Exercise Prescription  Increase Physical Activity;Increase Strength and Stamina;Understanding of Exercise Prescription   Comments  Reviewed RPE scale, THR and program prescription with pt today.  Pt voiced understanding and was given a copy of goals to take home.   Yatziry is off to a good start in rehab.  She has completed two full days of exercise.  All of her workloads are between 13-15 on the RPE scale.  We will continue to monitor her progress.   Reviewed home exercise with pt today.  Pt plans to walk at home and stores for exercise.  She is going to start by adding in one extra day at home to get to three days a week.  Reviewed  THR, pulse, RPE, sign and symptoms, and when to call 911 or MD.  Also discussed weather considerations and indoor options.  Pt voiced understanding.  Dhara has been doing well in rehab.  She is still at baseline workloads, but we will try to incease the NuStep for her. We will continue to monitor her progress.   Laveah continues to do well in rehab.  She is maintain 1.6 METs on the NuStep.  We will try to increase her workloads again.  We will continue to monitor her progression.    Expected Outcomes  Short: Use RPE daily to regulate intensity. Long: Follow program prescription in THR.  Short: Continue to attend rehab regularly.  Long: Continue to follow program prescription.  Short: Add in at least one extra day a week next week.  Long: Start to exercise independently  Short: Increase workload on NuStep.  Long: Continue to increase phsycial activity levels.   Short: Increase workloads.   Long: Continue to increase strength and stamina.    [place] Name  05/02/18 1524 05/15/18 0945 05/30/18 1428 06/12/18 1611 06/26/18 1627     Exercise Goal Re-Evaluation   Exercise Goals Review  Increase Physical Activity;Increase Strength and Stamina;Understanding of Exercise Prescription  Increase Physical Activity;Increase Strength and Stamina;Understanding of Exercise Prescription  Increase Physical Activity;Increase Strength and Stamina;Understanding of Exercise Prescription  Increase Physical Activity;Increase Strength and Stamina;Understanding of Exercise Prescription  Increase Physical Activity;Increase Strength and Stamina;Understanding of Exercise Prescription   Comments  Jacoria continues to do well in rehab. She is up to level 2 on the BioStep.  We will continue to monitor her progression.   Enyla has been doing well in rehab.   She is now on level 3 for the BioStep.  She continues to work hard in here for her.  We will continue to monitor her progress.   Tyshay continues to do well in rehab.  She is now up to 1.3 mph on the treadmill.  We will continue to monitor her progression.   Melisssa continues to work in class. She dropped back down to 1.2 mph and we will work with her to get back up to 1.3 mph.  We will continue to monitor her progress.  Nimco has been doing well in rehab.  She is back up to her 1.3 mph again.  She is also doing level 4 on the NuStep.  We will continue to monitor her progression.    Expected Outcomes  Short: Increase workload on NuStep. Long: Continue to improve strength and stamina.   Short: Increase NuStep and treadmill.  Long: Continue to exercise at home on off days.   Short: Continue to move up workloads.  Long: Continue to encourage more activity at home.   Short: Get speed on treadmill back up to 1.3 mph.  Long: Continue to move more at home.   Short: Increase BioStep to level 4.  Long: Continue to increase her physical activity.    [place] Name 07/10/18 1519             Exercise Goal Re-Evaluation   Exercise Goals Review  Increase Physical  Activity;Increase Strength and Stamina;Understanding of Exercise Prescription       Comments  Deicy is nearing graduation and will be doing her post 6MWT soon.  I expect to see her improve some.  She is planning to continue to exercise by walking at home.  We will continue to monitor her progress.        Expected Outcomes  Short: Improve post 6MWT.  Long: Continue to exercise independently.           Nutrition & Weight - Outcomes: Pre Biometrics - 03/06/18 1218      Pre Biometrics   Height  5' (1.524 m)    Weight  142 lb 14.4 oz (64.8 kg)    Waist Circumference  35 inches    Hip Circumference  41.5 inches    Waist to Hip Ratio  0.84 %    BMI (Calculated)  27.91      Post Biometrics - 07/11/18 1247       Post  Biometrics   Height  5' (1.524 m)    Weight  142 lb (64.4 kg)    Waist Circumference  34 inches    Hip Circumference  43 inches    Waist to Hip Ratio  0.79 %    BMI (Calculated)  27.73       Nutrition: Nutrition Therapy & Goals - 03/19/18 1214      Nutrition Therapy   Diet  TLC    Drug/Food Interactions  Purine/Gout    Protein (specify units)  5-6oz    Fiber  20 grams    Whole Grain Foods  2 servings   3 ideal   Saturated Fats  10 max. grams    Fruits and Vegetables  5 servings/day   8 ideal, eats small portions   Sodium  1500 grams      Personal Nutrition Goals   Nutrition Goal  On days when appetite is decreased, try to snack consistently between meals to help meet nutritional needs and maintain CBW    Personal Goal #2  Continue to monitor fluid and sodium intake r/t CKD as directed by your nephrologist    Comments  States her appetite varies day to day, but her eating pattern is typically a larger breakfast, small lunch and moderate dinner with snacks semi-regularly      Intervention Plan   Intervention  Prescribe, educate and counsel regarding individualized specific dietary modifications aiming towards targeted core components such as weight, hypertension,  lipid management, diabetes, heart failure and other comorbidities.    Expected Outcomes  Short Term Goal: Understand basic principles of dietary content, such as calories, fat, sodium, cholesterol and nutrients.;Short Term Goal: A plan has been developed with personal nutrition goals set during dietitian appointment.;Long Term Goal: Adherence to prescribed nutrition plan.       Nutrition Discharge: Nutrition Assessments - 07/16/18 1513      MEDFICTS Scores   Pre Score  36    Post Score  35    Score Difference  -1       Education Questionnaire Score: Knowledge Questionnaire Score - 07/16/18 1512      Knowledge Questionnaire Score   Pre Score  11/18    Post Score  17/18   reviewed with patient      Goals reviewed with patient; copy given to patient.

## 2018-07-26 ENCOUNTER — Other Ambulatory Visit: Payer: Self-pay | Admitting: Internal Medicine

## 2018-07-30 DIAGNOSIS — H353131 Nonexudative age-related macular degeneration, bilateral, early dry stage: Secondary | ICD-10-CM | POA: Diagnosis not present

## 2018-08-07 ENCOUNTER — Ambulatory Visit: Payer: PPO | Admitting: Internal Medicine

## 2018-08-09 ENCOUNTER — Other Ambulatory Visit: Payer: Self-pay | Admitting: Internal Medicine

## 2018-08-09 MED ORDER — PREDNISONE 10 MG PO TABS: ORAL_TABLET | ORAL | 0 refills | Status: DC

## 2018-08-09 MED ORDER — AMOXICILLIN-POT CLAVULANATE 875-125 MG PO TABS: 1.0000 | ORAL_TABLET | Freq: Two times a day (BID) | ORAL | 0 refills | Status: DC

## 2018-08-09 MED ORDER — HYDROCOD POLST-CPM POLST ER 10-8 MG/5ML PO SUER: 5.0000 mL | Freq: Every evening | ORAL | 0 refills | Status: DC | PRN

## 2018-08-14 ENCOUNTER — Other Ambulatory Visit: Payer: Self-pay | Admitting: Cardiovascular Disease

## 2018-08-16 DIAGNOSIS — N184 Chronic kidney disease, stage 4 (severe): Secondary | ICD-10-CM | POA: Diagnosis not present

## 2018-08-16 DIAGNOSIS — R809 Proteinuria, unspecified: Secondary | ICD-10-CM | POA: Diagnosis not present

## 2018-08-16 DIAGNOSIS — D631 Anemia in chronic kidney disease: Secondary | ICD-10-CM | POA: Diagnosis not present

## 2018-08-16 DIAGNOSIS — I129 Hypertensive chronic kidney disease with stage 1 through stage 4 chronic kidney disease, or unspecified chronic kidney disease: Secondary | ICD-10-CM | POA: Diagnosis not present

## 2018-08-20 ENCOUNTER — Other Ambulatory Visit: Payer: Self-pay | Admitting: Internal Medicine

## 2018-08-21 DIAGNOSIS — H26491 Other secondary cataract, right eye: Secondary | ICD-10-CM | POA: Diagnosis not present

## 2018-08-23 ENCOUNTER — Encounter: Payer: Self-pay | Admitting: Emergency Medicine

## 2018-08-23 ENCOUNTER — Other Ambulatory Visit: Payer: Self-pay

## 2018-08-23 ENCOUNTER — Inpatient Hospital Stay
Admission: EM | Admit: 2018-08-23 | Discharge: 2018-08-26 | DRG: 308 | Disposition: A | Discharge status: Home / Self Care | Payer: PPO | Attending: Internal Medicine | Admitting: Internal Medicine

## 2018-08-23 ENCOUNTER — Emergency Department: Payer: PPO

## 2018-08-23 DIAGNOSIS — Z9841 Cataract extraction status, right eye: Secondary | ICD-10-CM | POA: Diagnosis not present

## 2018-08-23 DIAGNOSIS — I130 Hypertensive heart and chronic kidney disease with heart failure and stage 1 through stage 4 chronic kidney disease, or unspecified chronic kidney disease: Secondary | ICD-10-CM | POA: Diagnosis present

## 2018-08-23 DIAGNOSIS — N2581 Secondary hyperparathyroidism of renal origin: Secondary | ICD-10-CM | POA: Diagnosis not present

## 2018-08-23 DIAGNOSIS — Z7951 Long term (current) use of inhaled steroids: Secondary | ICD-10-CM

## 2018-08-23 DIAGNOSIS — Z79890 Hormone replacement therapy (postmenopausal): Secondary | ICD-10-CM

## 2018-08-23 DIAGNOSIS — H9190 Unspecified hearing loss, unspecified ear: Secondary | ICD-10-CM | POA: Diagnosis present

## 2018-08-23 DIAGNOSIS — Z841 Family history of disorders of kidney and ureter: Secondary | ICD-10-CM | POA: Diagnosis not present

## 2018-08-23 DIAGNOSIS — E039 Hypothyroidism, unspecified: Secondary | ICD-10-CM | POA: Diagnosis present

## 2018-08-23 DIAGNOSIS — K5900 Constipation, unspecified: Secondary | ICD-10-CM | POA: Diagnosis present

## 2018-08-23 DIAGNOSIS — I4891 Unspecified atrial fibrillation: Secondary | ICD-10-CM

## 2018-08-23 DIAGNOSIS — R109 Unspecified abdominal pain: Secondary | ICD-10-CM

## 2018-08-23 DIAGNOSIS — K5790 Diverticulosis of intestine, part unspecified, without perforation or abscess without bleeding: Secondary | ICD-10-CM

## 2018-08-23 DIAGNOSIS — Z8051 Family history of malignant neoplasm of kidney: Secondary | ICD-10-CM

## 2018-08-23 DIAGNOSIS — N390 Urinary tract infection, site not specified: Secondary | ICD-10-CM | POA: Diagnosis not present

## 2018-08-23 DIAGNOSIS — K5730 Diverticulosis of large intestine without perforation or abscess without bleeding: Secondary | ICD-10-CM | POA: Diagnosis not present

## 2018-08-23 DIAGNOSIS — Z9049 Acquired absence of other specified parts of digestive tract: Secondary | ICD-10-CM

## 2018-08-23 DIAGNOSIS — I25118 Atherosclerotic heart disease of native coronary artery with other forms of angina pectoris: Secondary | ICD-10-CM | POA: Diagnosis present

## 2018-08-23 DIAGNOSIS — R809 Proteinuria, unspecified: Secondary | ICD-10-CM | POA: Diagnosis not present

## 2018-08-23 DIAGNOSIS — N184 Chronic kidney disease, stage 4 (severe): Secondary | ICD-10-CM | POA: Diagnosis present

## 2018-08-23 DIAGNOSIS — I5032 Chronic diastolic (congestive) heart failure: Secondary | ICD-10-CM | POA: Diagnosis present

## 2018-08-23 DIAGNOSIS — Z66 Do not resuscitate: Secondary | ICD-10-CM | POA: Diagnosis present

## 2018-08-23 DIAGNOSIS — Z807 Family history of other malignant neoplasms of lymphoid, hematopoietic and related tissues: Secondary | ICD-10-CM

## 2018-08-23 DIAGNOSIS — K2270 Barrett's esophagus without dysplasia: Secondary | ICD-10-CM | POA: Diagnosis not present

## 2018-08-23 DIAGNOSIS — Z7901 Long term (current) use of anticoagulants: Secondary | ICD-10-CM

## 2018-08-23 DIAGNOSIS — E785 Hyperlipidemia, unspecified: Secondary | ICD-10-CM | POA: Diagnosis not present

## 2018-08-23 DIAGNOSIS — R1084 Generalized abdominal pain: Secondary | ICD-10-CM

## 2018-08-23 DIAGNOSIS — I340 Nonrheumatic mitral (valve) insufficiency: Secondary | ICD-10-CM | POA: Diagnosis not present

## 2018-08-23 DIAGNOSIS — M109 Gout, unspecified: Secondary | ICD-10-CM | POA: Diagnosis not present

## 2018-08-23 DIAGNOSIS — Z90710 Acquired absence of both cervix and uterus: Secondary | ICD-10-CM | POA: Diagnosis not present

## 2018-08-23 DIAGNOSIS — Z8249 Family history of ischemic heart disease and other diseases of the circulatory system: Secondary | ICD-10-CM

## 2018-08-23 DIAGNOSIS — I959 Hypotension, unspecified: Secondary | ICD-10-CM | POA: Diagnosis not present

## 2018-08-23 DIAGNOSIS — K5732 Diverticulitis of large intestine without perforation or abscess without bleeding: Secondary | ICD-10-CM | POA: Diagnosis not present

## 2018-08-23 DIAGNOSIS — K219 Gastro-esophageal reflux disease without esophagitis: Secondary | ICD-10-CM | POA: Diagnosis present

## 2018-08-23 DIAGNOSIS — R188 Other ascites: Secondary | ICD-10-CM | POA: Diagnosis present

## 2018-08-23 DIAGNOSIS — D631 Anemia in chronic kidney disease: Secondary | ICD-10-CM | POA: Diagnosis not present

## 2018-08-23 DIAGNOSIS — I480 Paroxysmal atrial fibrillation: Secondary | ICD-10-CM | POA: Diagnosis not present

## 2018-08-23 DIAGNOSIS — F329 Major depressive disorder, single episode, unspecified: Secondary | ICD-10-CM | POA: Diagnosis not present

## 2018-08-23 DIAGNOSIS — Z79899 Other long term (current) drug therapy: Secondary | ICD-10-CM

## 2018-08-23 DIAGNOSIS — I129 Hypertensive chronic kidney disease with stage 1 through stage 4 chronic kidney disease, or unspecified chronic kidney disease: Secondary | ICD-10-CM | POA: Diagnosis not present

## 2018-08-23 DIAGNOSIS — J449 Chronic obstructive pulmonary disease, unspecified: Secondary | ICD-10-CM | POA: Diagnosis present

## 2018-08-23 DIAGNOSIS — I361 Nonrheumatic tricuspid (valve) insufficiency: Secondary | ICD-10-CM | POA: Diagnosis not present

## 2018-08-23 DIAGNOSIS — K55059 Acute (reversible) ischemia of intestine, part and extent unspecified: Secondary | ICD-10-CM | POA: Diagnosis present

## 2018-08-23 DIAGNOSIS — E876 Hypokalemia: Secondary | ICD-10-CM | POA: Diagnosis not present

## 2018-08-23 DIAGNOSIS — Z881 Allergy status to other antibiotic agents status: Secondary | ICD-10-CM

## 2018-08-23 DIAGNOSIS — I10 Essential (primary) hypertension: Secondary | ICD-10-CM | POA: Diagnosis not present

## 2018-08-23 DIAGNOSIS — Z888 Allergy status to other drugs, medicaments and biological substances status: Secondary | ICD-10-CM

## 2018-08-23 DIAGNOSIS — R112 Nausea with vomiting, unspecified: Secondary | ICD-10-CM | POA: Diagnosis not present

## 2018-08-23 LAB — CBC
HEMATOCRIT: 33.3 % — AB (ref 36.0–46.0)
HEMOGLOBIN: 10.3 g/dL — AB (ref 12.0–15.0)
MCH: 28.5 pg (ref 26.0–34.0)
MCHC: 30.9 g/dL (ref 30.0–36.0)
MCV: 92 fL (ref 80.0–100.0)
Platelets: 260 10*3/uL (ref 150–400)
RBC: 3.62 MIL/uL — ABNORMAL LOW (ref 3.87–5.11)
RDW: 14.4 % (ref 11.5–15.5)
WBC: 14.6 10*3/uL — ABNORMAL HIGH (ref 4.0–10.5)
nRBC: 0 % (ref 0.0–0.2)

## 2018-08-23 LAB — COMPREHENSIVE METABOLIC PANEL
ALT: 10 U/L (ref 0–44)
AST: 16 U/L (ref 15–41)
Albumin: 3.4 g/dL — ABNORMAL LOW (ref 3.5–5.0)
Alkaline Phosphatase: 81 U/L (ref 38–126)
Anion gap: 9 (ref 5–15)
BUN: 51 mg/dL — ABNORMAL HIGH (ref 8–23)
CO2: 21 mmol/L — ABNORMAL LOW (ref 22–32)
Calcium: 8.5 mg/dL — ABNORMAL LOW (ref 8.9–10.3)
Chloride: 103 mmol/L (ref 98–111)
Creatinine, Ser: 2.05 mg/dL — ABNORMAL HIGH (ref 0.44–1.00)
GFR calc Af Amer: 25 mL/min — ABNORMAL LOW (ref >60)
GFR calc non Af Amer: 21 mL/min — ABNORMAL LOW (ref >60)
Glucose, Bld: 107 mg/dL — ABNORMAL HIGH (ref 70–99)
Potassium: 4.1 mmol/L (ref 3.5–5.1)
Sodium: 133 mmol/L — ABNORMAL LOW (ref 135–145)
TOTAL PROTEIN: 6.9 g/dL (ref 6.5–8.1)
Total Bilirubin: 2.9 mg/dL — ABNORMAL HIGH (ref 0.3–1.2)

## 2018-08-23 LAB — LIPASE, BLOOD: Lipase: 20 U/L (ref 11–51)

## 2018-08-23 LAB — TSH: TSH: 0.731 u[IU]/mL (ref 0.350–4.500)

## 2018-08-23 MED ORDER — DILTIAZEM HCL-DEXTROSE 100-5 MG/100ML-% IV SOLN (PREMIX)
5.0000 mg/h | INTRAVENOUS | Status: DC
  Administered 2018-08-23: 5 mg/h via INTRAVENOUS
  Filled 2018-08-23: qty 100

## 2018-08-23 MED ORDER — ONDANSETRON HCL 4 MG/2ML IJ SOLN
4.0000 mg | Freq: Once | INTRAMUSCULAR | Status: AC
  Administered 2018-08-23: 4 mg via INTRAVENOUS
  Filled 2018-08-23: qty 2

## 2018-08-23 MED ORDER — PIPERACILLIN-TAZOBACTAM 3.375 G IVPB 30 MIN
3.3750 g | Freq: Once | INTRAVENOUS | Status: AC
  Administered 2018-08-23: 3.375 g via INTRAVENOUS
  Filled 2018-08-23: qty 50

## 2018-08-23 MED ORDER — ONDANSETRON HCL 4 MG/2ML IJ SOLN
4.0000 mg | Freq: Four times a day (QID) | INTRAMUSCULAR | Status: DC | PRN
  Administered 2018-08-25: 4 mg via INTRAVENOUS
  Filled 2018-08-23: qty 2

## 2018-08-23 MED ORDER — MORPHINE SULFATE (PF) 2 MG/ML IV SOLN
2.0000 mg | Freq: Once | INTRAVENOUS | Status: DC
  Filled 2018-08-23: qty 1

## 2018-08-23 MED ORDER — SODIUM CHLORIDE 0.9% FLUSH: 3.0000 mL | Freq: Once | INTRAVENOUS | Status: DC

## 2018-08-23 MED ORDER — MORPHINE SULFATE (PF) 2 MG/ML IV SOLN
2.0000 mg | Freq: Once | INTRAVENOUS | Status: AC
  Administered 2018-08-23: 2 mg via INTRAVENOUS
  Filled 2018-08-23: qty 1

## 2018-08-23 MED ORDER — SODIUM CHLORIDE 0.9 % IV BOLUS
1000.0000 mL | Freq: Once | INTRAVENOUS | Status: AC
  Administered 2018-08-23: 1000 mL via INTRAVENOUS

## 2018-08-23 NOTE — ED Notes (Signed)
EDP to bedside to provide patient with updated plan of care.

## 2018-08-23 NOTE — Progress Notes (Signed)
Family Meeting Note  Advance Directive:yes  Today a meeting took place with the Patient and daughter ( POA).   The following clinical team members were present during this meeting:MD  The following were discussed:Patient's diagnosis: Atrial fibrillation with rapid ventricular response, diverticulosis, chronic renal failure stage IV, hypertension, Patient's progosis: Unable to determine and Goals for treatment: DNR  Additional follow-up to be provided: Cardiologist.  Time spent during discussion:20 minutes  Vaughan Basta, MD

## 2018-08-23 NOTE — ED Notes (Signed)
EDP to bedside to place ultrasound guided PIV.

## 2018-08-23 NOTE — ED Notes (Signed)
Pt provided cup of ice water per request.

## 2018-08-23 NOTE — ED Notes (Signed)
Two unsuccessful PIV attempts per this RN.  Will have another RN attempt ultrasound guided placement.

## 2018-08-23 NOTE — ED Triage Notes (Signed)
Abdominal pain x 2 days.  Also c/o vomiting x 3.

## 2018-08-23 NOTE — ED Provider Notes (Signed)
[facility] Emergency Department Provider Note  ____________________________________________  Time seen: Approximately 10:18 PM  I have reviewed the triage vital signs and the nursing notes.   HISTORY  Chief Complaint Abdominal Pain    HPI Savannah Wilson is a 85 y.o. female with a history of CKD, depression, hypertension, atrial fibrillation who comes the ED complaining of generalized abdominal pain that is severe, nonradiating, no aggravating or alleviating factors, associated vomiting, constant for the last 2 days.  Denies any black or bloody stool.  No dizziness or syncope.  No chest pain shortness of breath but she does feel generally weak.  Reports that with her A. fib history she underwent cardioversion that seem to fix it.      Past Medical History:  Diagnosis Date  . Asthma   . Barrett esophagus   . Chronic bronchitis   . Chronic kidney disease (CKD), stage IV (severe) ([place])   . Depression   . Diverticulosis   . GERD (gastroesophageal reflux disease)   . Gout   . Hyperlipidemia   . Hypertension   . Hypothyroid   . Mitral regurgitation    a. TTE 02/2017: EF 55-60%, normal WM, calcified mitral annulus with mod regurg, moderately dilated LA, unable to estimate PASP  . Pancreatitis due to biliary obstruction March 2010   s/p ERCP sphincterotomy, cholecystectomy  . Peripheral neuralgia   . Persistent atrial fibrillation    a. diagnosed 01/24/17; CHADS2VASc => 5 (HTN, age x 2, vascular disease with PAD and aortic plaque, female) giving her an estimated annual stroke risk of 6.7%; b. successful DCCV 03/28/2017; c. on Eliquis  . Venous insufficiency      Patient Active Problem List   Diagnosis Date Noted  . Insomnia due to anxiety and fear 05/03/2018  . Bilateral pneumonia 08/14/2017  . Hospital discharge follow-up 05/06/2017  . CHF exacerbation ([place]) 04/15/2017  . Atrial fibrillation ([place]) 01/25/2017  . Grief at loss of child 01/25/2017  .  Atherosclerosis of aorta ([place]) 07/26/2016  . Upper respiratory infection, viral 06/07/2016  . Angioedema 12/06/2015  . Routine general medical examination at a health care facility 07/25/2015  . Bilateral hand numbness 01/29/2015  . Urinary incontinence, mixed 01/29/2015  . Counseling regarding advanced directives and goals of care 01/29/2015  . Medicare annual wellness visit, subsequent 08/09/2014  . Hypothyroidism 10/06/2013  . Hiatal hernia with gastroesophageal reflux 09/24/2013  . Zenker's diverticulum 09/24/2013  . Anxiety state 07/05/2013  . Need for prophylactic vaccination and inoculation against influenza 05/07/2013  . Allergic rhinitis 01/11/2013  . Dyspnea on exertion 01/11/2013  . Bradycardia, drug induced 08/26/2012  . Renal cysts, acquired, bilateral 08/26/2012  . Anemia in chronic kidney disease (CKD) 06/24/2012  . Neuropathy 05/22/2012  . History of pancreatitis   . Hypertension 09/15/2011  . Restless legs syndrome (RLS) 09/15/2011  . Asthma   . Depression, major, single episode, moderate ([place])   . Venous insufficiency   . Barrett esophagus   . CKD (chronic kidney disease), stage IV [facility])      Past Surgical History:  Procedure Laterality Date  . ABDOMINAL HYSTERECTOMY    . CARDIOVERSION N/A 03/28/2017   Procedure: CARDIOVERSION;  Surgeon: Nelva Bush, MD;  Location: [facility];  Service: Cardiovascular;  Laterality: N/A;  . CARPAL TUNNEL RELEASE  jan 2013   [name]  . CATARACT EXTRACTION     right  . CATARACT EXTRACTION  2008  . CHOLECYSTECTOMY  02/10  . CHOLECYSTECTOMY  2010  .  TONSILLECTOMY    . VENTRAL HERNIA REPAIR    . VENTRAL HERNIA REPAIR  2007  . VESICOVAGINAL FISTULA CLOSURE W/ TAH       Prior to Admission medications   Medication Sig Start Date End Date Taking? Authorizing Provider  acetaminophen (TYLENOL) 650 MG CR tablet Take 650 mg by mouth every 8 (eight) hours as needed for pain.     [provider]  ALPRAZolam Duanne Moron)  0.5 MG tablet Take 1 tablet (0.5 mg total) by mouth 2 (two) times daily as needed for anxiety. 02/07/18   [name], MD  amoxicillin-clavulanate (AUGMENTIN) 875-125 MG tablet Take 1 tablet by mouth 2 (two) times daily. 08/09/18   [name], MD  apixaban (ELIQUIS) 2.5 MG TABS tablet Take 1 tablet (2.5 mg total) by mouth 2 (two) times daily. 02/12/18   Wellington Hampshire, MD  benzonatate (TESSALON) 200 MG capsule Take 1 capsule (200 mg total) by mouth 3 (three) times daily as needed for cough. 08/03/17   [name], MD  budesonide (PULMICORT) 0.25 MG/2ML nebulizer solution USE ONE (1) VIAL IN NEBULIZER TWO TIMES PER DAY 08/21/18   [name], MD  cetirizine (ZYRTEC ALLERGY) 10 MG tablet Take 10 mg by mouth daily.    [provider]  chlorpheniramine-HYDROcodone (TUSSIONEX PENNKINETIC ER) 10-8 MG/5ML SUER Take 5 mLs by mouth at bedtime as needed for cough. 08/09/18   [name], MD  Cholecalciferol (VITAMIN D-3) 1000 units CAPS Take 1,000 Units by mouth daily.     [provider]  escitalopram (LEXAPRO) 10 MG tablet Take 1 tablet (10 mg total) by mouth daily. After dinner 01/29/18   [name], MD  fluticasone (FLONASE) 50 MCG/ACT nasal spray Place 2 sprays into the nose daily. Patient taking differently: Place 2 sprays into the nose daily as needed (For allergies.).  01/30/13   [name], MD  furosemide (LASIX) 20 MG tablet TAKE ONE TABLET EVERY DAY 07/18/18   Minna Merritts, MD  furosemide (LASIX) 40 MG tablet Take 40 mg by mouth daily.    [provider]  hydrALAZINE (APRESOLINE) 50 MG tablet TAKE ONE TABLET BY MOUTH 3 TIMES DAILY 08/15/18   Wellington Hampshire, MD  ipratropium-albuterol (DUONEB) 0.5-2.5 (3) MG/3ML SOLN Take 3 mLs by nebulization every 6 (six) hours as needed. 08/15/17   [name], MD  levothyroxine (SYNTHROID, LEVOTHROID) 100 MCG tablet TAKE ONE TABLET BY MOUTH EVERY DAY FOR THYROID 06/13/18   [name], MD  losartan  (COZAAR) 50 MG tablet TAKE ONE TABLET EVERY DAY 08/09/18   [name], MD  mupirocin ointment (BACTROBAN) 2 % Place 1 application into the nose 2 (two) times daily. 05/01/18   [name], MD  omeprazole (PRILOSEC) 40 MG capsule TAKE ONE CAPSULE BY MOUTH TWICE A DAY. BEFORE BREAKFAST AND SUPPER 03/13/18   [name], MD  predniSONE (DELTASONE) 10 MG tablet 6 tablets on Day 1 , then reduce by 1 tablet daily until gone 08/09/18   [name], MD  PROAIR HFA 108 (90 BASE) MCG/ACT inhaler INHALE 2 PUFFS EVERY 6 HOURS AS NEEDED FOR WHEEZING 03/13/15   [name], MD  rOPINIRole (REQUIP) 0.25 MG tablet TAKE ONE TABLET 3 TIMES DAILY 07/26/18   [name], MD  Spacer/Aero-Holding Chambers [facility] ADVANTAGE) MISC 1 each by Other route once. Always uses her when you're using a metered-dose inhaler. You've aromatase medicine as much, he won't have  his much side effect, but you it twice as much medicine and your lungs. 06/08/15   Ahmed Prima, MD  temazepam (RESTORIL) 15 MG capsule TAKE 1 CAPSULE BY MOUTH AT BEDTIME AS NEEDED FOR SLEEP 06/13/18   [name], MD  vitamin B-12 (CYANOCOBALAMIN) 1000 MCG tablet Take 1,000 mcg by mouth daily.    [provider]     Allergies Ace inhibitors; Benicar [olmesartan medoxomil]; Clarithromycin; Clarithromycin; Norvasc [amlodipine besylate]; Levofloxacin; and Zithromax [azithromycin dihydrate]   Family History  Problem Relation Age of Onset  . Kidney failure Mother   . Hypertension Mother   . Kidney disease Mother   . Multiple myeloma Daughter   . Cancer Daughter        multiple myeloma  . Kidney disease Daughter   . Heart disease Father   . Rheumatologic disease Father   . Kidney cancer Father     Social History Social History   Tobacco Use  . Smoking status: Never Smoker  . Smokeless tobacco: Never Used  Substance Use Topics  . Alcohol use: No    Comment: rare  . Drug use: No    Review of  Systems  Constitutional:   No fever or chills.  ENT:   No sore throat. No rhinorrhea. Cardiovascular:   No chest pain or syncope. Respiratory:   No dyspnea or cough. Gastrointestinal:   Positive as above for abdominal pain and vomiting.  No constipation Musculoskeletal:   Negative for focal pain or swelling All other systems reviewed and are negative except as documented above in ROS and HPI.  ____________________________________________   PHYSICAL EXAM:  VITAL SIGNS: ED Triage Vitals  Enc Vitals Group     BP 08/23/18 1251 107/71     Pulse Rate 08/23/18 1251 89     Resp 08/23/18 1251 20     Temp 08/23/18 1251 98.1 F (36.7 C)     Temp Source 08/23/18 1251 Oral     SpO2 08/23/18 1251 98 %     Weight 08/23/18 1252 134 lb (60.8 kg)     Height 08/23/18 1252 5' (1.524 m)     Head Circumference --      Peak Flow --      Pain Score 08/23/18 1251 10     Pain Loc --      Pain Edu? --      Excl. in [place]? --     Vital signs reviewed, nursing assessments reviewed.   Constitutional:   Alert and oriented.  Ill-appearing Eyes:   Conjunctivae are normal. EOMI. PERRL. ENT      Head:   Normocephalic and atraumatic.      Nose:   No congestion/rhinnorhea.       Mouth/Throat:   Dry mucous membranes, no pharyngeal erythema. No peritonsillar mass.       Neck:   No meningismus. Full ROM. Hematological/Lymphatic/Immunilogical:   No cervical lymphadenopathy. Cardiovascular:   Irregular rhythm, rate 140, variable. Symmetric bilateral radial and DP pulses.  No murmurs. Cap refill less than 2 seconds. Respiratory:   Normal respiratory effort without tachypnea/retractions. Breath sounds are clear and equal bilaterally. No wheezes/rales/rhonchi. Gastrointestinal:   Soft with significant tenderness to palpation all over.  Moderately distended.  Tympany and pain with percussion.. There is no CVA tenderness.  No rebound, rigidity, or guarding.  Musculoskeletal:   Normal range of motion in all  extremities. No joint effusions.  No lower extremity tenderness.  No edema. Neurologic:   Normal speech and  language.  Motor grossly intact. No acute focal neurologic deficits are appreciated.  Skin:    Skin is warm, dry and intact. No rash noted.  No petechiae, purpura, or bullae.  ____________________________________________    LABS (pertinent positives/negatives) (all labs ordered are listed, but only abnormal results are displayed) Labs Reviewed  COMPREHENSIVE METABOLIC PANEL - Abnormal; Notable for the following components:      Result Value   Sodium 133 (*)    CO2 21 (*)    Glucose, Bld 107 (*)    BUN 51 (*)    Creatinine, Ser 2.05 (*)    Calcium 8.5 (*)    Albumin 3.4 (*)    Total Bilirubin 2.9 (*)    GFR calc non Af Amer 21 (*)    GFR calc Af Amer 25 (*)    All other components within normal limits  CBC - Abnormal; Notable for the following components:   WBC 14.6 (*)    RBC 3.62 (*)    Hemoglobin 10.3 (*)    HCT 33.3 (*)    All other components within normal limits  LIPASE, BLOOD  URINALYSIS, COMPLETE (UACMP) WITH MICROSCOPIC  TSH   ____________________________________________   EKG    ____________________________________________    RADIOLOGY  Ct Abdomen Pelvis Wo Contrast  Result Date: 08/23/2018 CLINICAL DATA:  Abdominal pain for 2 days.  Vomiting. EXAM: CT ABDOMEN AND PELVIS WITHOUT CONTRAST TECHNIQUE: Multidetector CT imaging of the abdomen and pelvis was performed following the standard protocol without IV contrast. COMPARISON:  CT abdomen pelvis 06/21/2013 FINDINGS: Lower chest: Heart is enlarged. Moderate sized hiatal hernia. Small bilateral pleural effusions. Subpleural consolidation within the right greater than left lower lobes. Hepatobiliary: The liver is normal in size and contour. Prior cholecystectomy. Pancreas: Unremarkable Spleen: Unremarkable Adrenals/Urinary Tract: Normal adrenal glands. Unchanged 1.1 cm exophytic lesion superior pole right  kidney. Interval increase in size of 3.3 cm exophytic lesion interpolar region left kidney (image 33; series 2) with an internal density of 21 Hounsfield units. Small amount of gas in the urinary bladder. Stomach/Bowel: Small volume ascites. Diverticuli involving the small bowel. Descending and sigmoid colonic diverticulosis. No CT evidence for acute diverticulitis. No evidence for small bowel obstruction. Vascular/Lymphatic: Normal caliber abdominal aorta. Peripheral calcified atherosclerotic plaque. No retroperitoneal lymphadenopathy. Reproductive: Status post hysterectomy. Other: None. Musculoskeletal: Lumbar spine degenerative changes. No aggressive or acute appearing osseous lesions. IMPRESSION: 1. Small volume ascites within the abdomen. 2. Small bowel and large bowel diverticulosis without evidence for acute diverticulitis. 3. Small bilateral pleural effusions, right-greater-than-left. 4. Small amount of gas in the urinary bladder, correlate for recent instrumentation. Electronically Signed   By: Lovey Newcomer M.D.   On: 08/23/2018 18:52    ____________________________________________   PROCEDURES .Critical Care Performed by: Carrie Mew, MD Authorized by: Carrie Mew, MD   Critical care provider statement:    Critical care time (minutes):  35   Critical care time was exclusive of:  Separately billable procedures and treating other patients   Critical care was necessary to treat or prevent imminent or life-threatening deterioration of the following conditions:  Cardiac failure   Critical care was time spent personally by me on the following activities:  Development of treatment plan with patient or surrogate, discussions with consultants, evaluation of patient's response to treatment, examination of patient, obtaining history from patient or surrogate, ordering and performing treatments and interventions, ordering and review of laboratory studies, ordering and review of radiographic  studies, pulse oximetry, re-evaluation of patient's condition and  review of old charts    ____________________________________________  DIFFERENTIAL DIAGNOSIS   Diverticulitis, bowel obstruction, bowel perforation, intra-abdominal abscess.  CLINICAL IMPRESSION / ASSESSMENT AND PLAN / ED COURSE  Pertinent labs & imaging results that were available during my care of the patient were reviewed by me and considered in my medical decision making (see chart for details).    Patient presents with A. fib with RVR as well as severe abdominal pain and tenderness.  With the degree of tenderness, she does not have pain out of proportion and I doubt mesenteric ischemia.  Not consistent with AAA or dissection.  Noncontrast CT scan ordered to evaluate for intra-abdominal pathology, no contrast due to CKD.  ----------------------------------------- 10:23 PM on 08/23/2018 -----------------------------------------  CT overall unremarkable, shows diverticulosis without complication.  Persistent A. fib with RVR, start Dil drip.  She is also still very tender so given repeat dose of morphine for her pain.  Still waiting on a urinalysis.      ____________________________________________   FINAL CLINICAL IMPRESSION(S) / ED DIAGNOSES    Final diagnoses:  Paroxysmal atrial fibrillation with rapid ventricular response (HCC)  Generalized abdominal pain  Diverticulosis     ED Discharge Orders    None      Portions of this note were generated with dragon dictation software. Dictation errors may occur despite best attempts at proofreading.   Carrie Mew, MD 08/23/18 2223

## 2018-08-23 NOTE — ED Notes (Signed)
Daughter, Knute Neu, [phone].  Call with any changes.  She will return, but call with room number when admitted

## 2018-08-23 NOTE — ED Notes (Signed)
Patient transported to CT 

## 2018-08-23 NOTE — H&P (Signed)
[facility] [place] NAME: Savannah Wilson    MR#:  [id number]  DATE OF BIRTH:  April 07, 1929  DATE OF ADMISSION:  08/23/2018  PRIMARY CARE PHYSICIAN: [name], MD   REQUESTING/REFERRING PHYSICIAN: Joni Fears  CHIEF COMPLAINT:   Chief Complaint  Patient presents with  . Abdominal Pain    HISTORY OF PRESENT ILLNESS: Savannah Wilson  is a 85 y.o. female with a known history of asthma, Barrett's esophagus, chronic kidney disease stage IV, diverticulosis, gout, hyperlipidemia, hypertension, hypothyroidism, mitral regurgitation, ejection fraction 55 to 60%, pancreatitis, persistent atrial fibrillation status post cardioversion on Eliquis-lives home alone and walks with a walker. 2 days ago she had episode of vomiting once and loose stool.  She also had associated abdominal pain which was generalized and persisted since then until now.  She did not had any more vomiting or bowel movement since then.  Abdominal pain is generalized and nonradiating.  No fever or chills.  She denies any urinary symptoms. She had a routine follow-up appointment with nephrologist Dr. Loma Newton found her heart rate to be very fast and advised her to go to emergency room today. In ER she was noted to have A. fib with RVR up to 140, started on Cardizem IV drip and that helped to control the heart rate.  CT scan of abdomen in ED shows diverticulosis but no infection or reason for abdominal pain.  Given to hospitalist team for further management.  PAST MEDICAL HISTORY:   Past Medical History:  Diagnosis Date  . Asthma   . Barrett esophagus   . Chronic bronchitis   . Chronic kidney disease (CKD), stage IV (severe) ([place])   . Depression   . Diverticulosis   . GERD (gastroesophageal reflux disease)   . Gout   . Hyperlipidemia   . Hypertension   . Hypothyroid   . Mitral regurgitation    a. TTE 02/2017: EF 55-60%, normal WM, calcified mitral annulus with mod regurg, moderately  dilated LA, unable to estimate PASP  . Pancreatitis due to biliary obstruction March 2010   s/p ERCP sphincterotomy, cholecystectomy  . Peripheral neuralgia   . Persistent atrial fibrillation    a. diagnosed 01/24/17; CHADS2VASc => 5 (HTN, age x 2, vascular disease with PAD and aortic plaque, female) giving her an estimated annual stroke risk of 6.7%; b. successful DCCV 03/28/2017; c. on Eliquis  . Venous insufficiency     PAST SURGICAL HISTORY:  Past Surgical History:  Procedure Laterality Date  . ABDOMINAL HYSTERECTOMY    . CARDIOVERSION N/A 03/28/2017   Procedure: CARDIOVERSION;  Surgeon: Nelva Bush, MD;  Location: [facility];  Service: Cardiovascular;  Laterality: N/A;  . CARPAL TUNNEL RELEASE  jan 2013   [name]  . CATARACT EXTRACTION     right  . CATARACT EXTRACTION  2008  . CHOLECYSTECTOMY  02/10  . CHOLECYSTECTOMY  2010  . TONSILLECTOMY    . VENTRAL HERNIA REPAIR    . VENTRAL HERNIA REPAIR  2007  . VESICOVAGINAL FISTULA CLOSURE W/ TAH      SOCIAL HISTORY:  Social History   Tobacco Use  . Smoking status: Never Smoker  . Smokeless tobacco: Never Used  Substance Use Topics  . Alcohol use: No    Comment: rare    FAMILY HISTORY:  Family History  Problem Relation Age of Onset  . Kidney failure Mother   . Hypertension Mother   . Kidney disease Mother   . Multiple myeloma Daughter   .  Cancer Daughter        multiple myeloma  . Kidney disease Daughter   . Heart disease Father   . Rheumatologic disease Father   . Kidney cancer Father     DRUG ALLERGIES:  Allergies  Allergen Reactions  . Ace Inhibitors Cough  . Benicar [Olmesartan Medoxomil]     Unsure of reaction type  . Clarithromycin Other (See Comments)    Blisters in mouth  . Clarithromycin Other (See Comments)    Unknown   . Norvasc [Amlodipine Besylate]   . Levofloxacin Rash  . Zithromax [Azithromycin Dihydrate] Rash    REVIEW OF SYSTEMS:   CONSTITUTIONAL: No fever, fatigue or weakness.   EYES: No blurred or double vision.  EARS, NOSE, AND THROAT: No tinnitus or ear pain.  RESPIRATORY: No cough, shortness of breath, wheezing or hemoptysis.  CARDIOVASCULAR: No chest pain, orthopnea, edema.  GASTROINTESTINAL: Had nausea, vomiting, diarrhea and abdominal pain.  GENITOURINARY: No dysuria, hematuria.  ENDOCRINE: No polyuria, nocturia,  HEMATOLOGY: No anemia, easy bruising or bleeding SKIN: No rash or lesion. MUSCULOSKELETAL: No joint pain or arthritis.   NEUROLOGIC: No tingling, numbness, weakness.  PSYCHIATRY: No anxiety or depression.   MEDICATIONS AT HOME:  Prior to Admission medications   Medication Sig Start Date End Date Taking? Authorizing Provider  acetaminophen (TYLENOL) 650 MG CR tablet Take 650 mg by mouth every 8 (eight) hours as needed for pain.    Yes [provider]  ALPRAZolam (XANAX) 0.5 MG tablet Take 1 tablet (0.5 mg total) by mouth 2 (two) times daily as needed for anxiety. 02/07/18  Yes [name], MD  apixaban (ELIQUIS) 2.5 MG TABS tablet Take 1 tablet (2.5 mg total) by mouth 2 (two) times daily. 02/12/18  Yes Wellington Hampshire, MD  budesonide (PULMICORT) 0.25 MG/2ML nebulizer solution USE ONE (1) VIAL IN NEBULIZER TWO TIMES PER DAY 08/21/18  Yes [name], MD  cetirizine (ZYRTEC ALLERGY) 10 MG tablet Take 10 mg by mouth daily.   Yes [provider]  Cholecalciferol (VITAMIN D-3) 1000 units CAPS Take 1,000 Units by mouth daily.    Yes [provider]  escitalopram (LEXAPRO) 10 MG tablet Take 1 tablet (10 mg total) by mouth daily. After dinner 01/29/18  Yes [name], MD  fluticasone (FLONASE) 50 MCG/ACT nasal spray Place 2 sprays into the nose daily. Patient taking differently: Place 2 sprays into the nose daily as needed (For allergies.).  01/30/13  Yes [name], MD  furosemide (LASIX) 40 MG tablet Take 40 mg by mouth daily.   Yes [provider]  hydrALAZINE (APRESOLINE) 50 MG tablet TAKE ONE TABLET BY  MOUTH 3 TIMES DAILY 08/15/18  Yes Wellington Hampshire, MD  ipratropium-albuterol (DUONEB) 0.5-2.5 (3) MG/3ML SOLN Take 3 mLs by nebulization every 6 (six) hours as needed. 08/15/17  Yes [name], MD  levothyroxine (SYNTHROID, LEVOTHROID) 100 MCG tablet TAKE ONE TABLET BY MOUTH EVERY DAY FOR THYROID 06/13/18  Yes [name], MD  losartan (COZAAR) 50 MG tablet TAKE ONE TABLET EVERY DAY 08/09/18  Yes [name], MD  mupirocin ointment (BACTROBAN) 2 % Place 1 application into the nose 2 (two) times daily. 05/01/18  Yes [name], MD  omeprazole (PRILOSEC) 40 MG capsule TAKE ONE CAPSULE BY MOUTH TWICE A DAY. BEFORE BREAKFAST AND SUPPER 03/13/18  Yes [name], MD  PROAIR HFA 108 (90 BASE) MCG/ACT inhaler INHALE 2 PUFFS EVERY 6 HOURS AS NEEDED FOR WHEEZING  03/13/15  Yes [name], MD  rOPINIRole (REQUIP) 0.25 MG tablet TAKE ONE TABLET 3 TIMES DAILY 07/26/18  Yes [name], MD  Spacer/Aero-Holding Chambers ([place]) [place] 1 each by Other route once. Always uses her when you're using a metered-dose inhaler. You've aromatase medicine as much, he won't have his much side effect, but you it twice as much medicine and your lungs. 06/08/15  Yes Ahmed Prima, MD  temazepam (RESTORIL) 15 MG capsule TAKE 1 CAPSULE BY MOUTH AT BEDTIME AS NEEDED FOR SLEEP 06/13/18  Yes [name], MD  vitamin B-12 (CYANOCOBALAMIN) 1000 MCG tablet Take 1,000 mcg by mouth daily.   Yes [provider]  amoxicillin-clavulanate (AUGMENTIN) 875-125 MG tablet Take 1 tablet by mouth 2 (two) times daily. Patient not taking: Reported on 08/23/2018 08/09/18   [name], MD  benzonatate (TESSALON) 200 MG capsule Take 1 capsule (200 mg total) by mouth 3 (three) times daily as needed for cough. Patient not taking: Reported on 08/23/2018 08/03/17   [name], MD  chlorpheniramine-HYDROcodone [facility] PENNKINETIC ER) 10-8 MG/5ML SUER Take 5 mLs by mouth at bedtime as needed for  cough. Patient not taking: Reported on 08/23/2018 08/09/18   [name], MD  predniSONE (DELTASONE) 10 MG tablet 6 tablets on Day 1 , then reduce by 1 tablet daily until gone Patient not taking: Reported on 08/23/2018 08/09/18   [name], MD      PHYSICAL EXAMINATION:   VITAL SIGNS: Blood pressure (!) 108/56, pulse 82, temperature 98.1 F (36.7 C), temperature source Oral, resp. rate 19, height 5' (1.524 m), weight 60.8 kg, SpO2 95 %.  GENERAL:  85 y.o.-year-old patient lying in the bed with no acute distress.  EYES: Pupils equal, round, reactive to light and accommodation. No scleral icterus. Extraocular muscles intact.  HEENT: Head atraumatic, normocephalic. Oropharynx and nasopharynx clear.  NECK:  Supple, no jugular venous distention. No thyroid enlargement, no tenderness.  LUNGS: Normal breath sounds bilaterally, no wheezing, rales,rhonchi or crepitation. No use of accessory muscles of respiration.  CARDIOVASCULAR: S1, S2 Irregular. No murmurs, rubs, or gallops.  ABDOMEN: Soft, generalized tender, nondistended. Bowel sounds present. No organomegaly or mass.  EXTREMITIES: No pedal edema, cyanosis, or clubbing.  NEUROLOGIC: Cranial nerves II through XII are intact. Muscle strength 4/5 in all extremities. Sensation intact. Gait not checked.  PSYCHIATRIC: The patient is alert and oriented x 3.  SKIN: No obvious rash, lesion, or ulcer.   LABORATORY PANEL:   CBC Recent Labs  Lab 08/23/18 1254  WBC 14.6*  HGB 10.3*  HCT 33.3*  PLT 260  MCV 92.0  MCH 28.5  MCHC 30.9  RDW 14.4   ------------------------------------------------------------------------------------------------------------------  Chemistries  Recent Labs  Lab 08/23/18 1254  NA 133*  K 4.1  CL 103  CO2 21*  GLUCOSE 107*  BUN 51*  CREATININE 2.05*  CALCIUM 8.5*  AST 16  ALT 10  ALKPHOS 81  BILITOT 2.9*    ------------------------------------------------------------------------------------------------------------------ estimated creatinine clearance is 15.7 mL/min (A) (by C-G formula based on SCr of 2.05 mg/dL (H)). ------------------------------------------------------------------------------------------------------------------ Recent Labs    08/23/18 1254  TSH 0.731     Coagulation profile No results for input(s): INR, PROTIME in the last 168 hours. ------------------------------------------------------------------------------------------------------------------- No results for input(s): DDIMER in the last 72 hours. -------------------------------------------------------------------------------------------------------------------  Cardiac Enzymes No results for input(s): CKMB, TROPONINI, MYOGLOBIN in the last 168 hours.  Invalid input(s): CK ------------------------------------------------------------------------------------------------------------------ Invalid input(s): POCBNP  ---------------------------------------------------------------------------------------------------------------  Urinalysis  Component Value Date/Time   COLORURINE YELLOW (A) 09/19/2017 1850   APPEARANCEUR CLEAR (A) 09/19/2017 1850   APPEARANCEUR Clear 08/23/2012 0459   LABSPEC 1.006 09/19/2017 1850   LABSPEC 1.006 08/23/2012 0459   PHURINE 5.0 09/19/2017 1850   GLUCOSEU NEGATIVE 09/19/2017 1850   GLUCOSEU Negative 08/23/2012 0459   HGBUR NEGATIVE 09/19/2017 1850   BILIRUBINUR NEGATIVE 09/19/2017 1850   BILIRUBINUR neg 04/16/2013 1550   BILIRUBINUR Negative 08/23/2012 0459   KETONESUR NEGATIVE 09/19/2017 1850   PROTEINUR NEGATIVE 09/19/2017 1850   UROBILINOGEN 0.2 04/16/2013 1550   NITRITE NEGATIVE 09/19/2017 1850   LEUKOCYTESUR TRACE (A) 09/19/2017 1850   LEUKOCYTESUR Negative 08/23/2012 0459     RADIOLOGY: Ct Abdomen Pelvis Wo Contrast  Result Date: 08/23/2018 CLINICAL DATA:   Abdominal pain for 2 days.  Vomiting. EXAM: CT ABDOMEN AND PELVIS WITHOUT CONTRAST TECHNIQUE: Multidetector CT imaging of the abdomen and pelvis was performed following the standard protocol without IV contrast. COMPARISON:  CT abdomen pelvis 06/21/2013 FINDINGS: Lower chest: Heart is enlarged. Moderate sized hiatal hernia. Small bilateral pleural effusions. Subpleural consolidation within the right greater than left lower lobes. Hepatobiliary: The liver is normal in size and contour. Prior cholecystectomy. Pancreas: Unremarkable Spleen: Unremarkable Adrenals/Urinary Tract: Normal adrenal glands. Unchanged 1.1 cm exophytic lesion superior pole right kidney. Interval increase in size of 3.3 cm exophytic lesion interpolar region left kidney (image 33; series 2) with an internal density of 21 Hounsfield units. Small amount of gas in the urinary bladder. Stomach/Bowel: Small volume ascites. Diverticuli involving the small bowel. Descending and sigmoid colonic diverticulosis. No CT evidence for acute diverticulitis. No evidence for small bowel obstruction. Vascular/Lymphatic: Normal caliber abdominal aorta. Peripheral calcified atherosclerotic plaque. No retroperitoneal lymphadenopathy. Reproductive: Status post hysterectomy. Other: None. Musculoskeletal: Lumbar spine degenerative changes. No aggressive or acute appearing osseous lesions. IMPRESSION: 1. Small volume ascites within the abdomen. 2. Small bowel and large bowel diverticulosis without evidence for acute diverticulitis. 3. Small bilateral pleural effusions, right-greater-than-left. 4. Small amount of gas in the urinary bladder, correlate for recent instrumentation. Electronically Signed   By: Lovey Newcomer M.D.   On: 08/23/2018 18:52    EKG: Orders placed or performed in visit on 07/03/18  . EKG 12-Lead    IMPRESSION AND PLAN:  *Atrial fibrillation with rapid ventricular response Heart rate under control with IV Cardizem drip started by ER. Patient  is on anticoagulation with Eliquis at home, continue the same. I do not see a recent echocardiogram on her so we will get 1. Also call cardiology consult with her primary team. Check TSH and urinalysis. Follow serial troponin.  *Abdominal pain CT scan abdomen does not show any explanation to this. Follow urinalysis which is ordered but not collected yet. Give Zofran as needed for abdominal pain and keep on liquid diet.  *Hypertension As blood pressure is borderline, I will hold home medications for now and keep monitoring.  *COPD Continue home inhalers and nebulizers for now.  *GERD and Barrett's esophagus Continue PPI twice daily.  *Hypothyroidism Continue levothyroxine and check TSH.  *Chronic renal failure stage IV Appears to be stable at this time.  Continue to monitor.  All the records are reviewed and case discussed with ED provider. Management plans discussed with the patient, family and they are in agreement.  CODE STATUS: DNR. Code Status History    Date Active Date Inactive Code Status Order ID Comments User Context   09/19/2017 2257 09/21/2017 1633 Full Code [id number]  Dustin Flock, MD Inpatient  04/15/2017 1525 04/19/2017 1927 Full Code [id number]  Gladstone Lighter, MD Inpatient      Discussed with patient's daughter who is in the room. TOTAL TIME TAKING CARE OF THIS PATIENT: 50 minutes.    Vaughan Basta M.D on 08/23/2018   Between 7am to 6pm - Pager - [phone]  After 6pm go to [email or web address] - password [email or web address] [facility]  Office  [phone]  CC: Primary care physician; [name], MD   Note: This dictation was prepared with Dragon dictation along with smaller phrase technology. Any transcriptional errors that result from this process are unintentional.

## 2018-08-24 ENCOUNTER — Inpatient Hospital Stay (HOSPITAL_COMMUNITY)
Admit: 2018-08-24 | Discharge: 2018-08-24 | Disposition: A | Discharge status: Home / Self Care | Payer: PPO | Attending: Internal Medicine | Admitting: Internal Medicine

## 2018-08-24 ENCOUNTER — Other Ambulatory Visit: Payer: Self-pay

## 2018-08-24 DIAGNOSIS — I361 Nonrheumatic tricuspid (valve) insufficiency: Secondary | ICD-10-CM

## 2018-08-24 DIAGNOSIS — I4891 Unspecified atrial fibrillation: Secondary | ICD-10-CM

## 2018-08-24 DIAGNOSIS — R1084 Generalized abdominal pain: Secondary | ICD-10-CM

## 2018-08-24 DIAGNOSIS — I340 Nonrheumatic mitral (valve) insufficiency: Secondary | ICD-10-CM

## 2018-08-24 DIAGNOSIS — I25118 Atherosclerotic heart disease of native coronary artery with other forms of angina pectoris: Secondary | ICD-10-CM

## 2018-08-24 DIAGNOSIS — K5790 Diverticulosis of intestine, part unspecified, without perforation or abscess without bleeding: Secondary | ICD-10-CM

## 2018-08-24 DIAGNOSIS — D631 Anemia in chronic kidney disease: Secondary | ICD-10-CM

## 2018-08-24 DIAGNOSIS — N184 Chronic kidney disease, stage 4 (severe): Secondary | ICD-10-CM

## 2018-08-24 DIAGNOSIS — R112 Nausea with vomiting, unspecified: Secondary | ICD-10-CM

## 2018-08-24 LAB — URINALYSIS, COMPLETE (UACMP) WITH MICROSCOPIC
Bilirubin Urine: NEGATIVE
Glucose, UA: NEGATIVE mg/dL
Ketones, ur: NEGATIVE mg/dL
Nitrite: NEGATIVE
Protein, ur: NEGATIVE mg/dL
Specific Gravity, Urine: 1.014 (ref 1.005–1.030)
WBC, UA: >50 WBC/hpf — ABNORMAL HIGH (ref 0–5)
pH: 5 (ref 5.0–8.0)

## 2018-08-24 LAB — ECHOCARDIOGRAM COMPLETE
Height: 60 in
Weight: 2144 oz

## 2018-08-24 LAB — HEMOGLOBIN A1C
Hgb A1c MFr Bld: 5.4 % (ref 4.8–5.6)
Mean Plasma Glucose: 108.28 mg/dL

## 2018-08-24 LAB — BASIC METABOLIC PANEL
Anion gap: 5 (ref 5–15)
BUN: 52 mg/dL — AB (ref 8–23)
CO2: 23 mmol/L (ref 22–32)
Calcium: 7.8 mg/dL — ABNORMAL LOW (ref 8.9–10.3)
Chloride: 107 mmol/L (ref 98–111)
Creatinine, Ser: 2.07 mg/dL — ABNORMAL HIGH (ref 0.44–1.00)
GFR calc Af Amer: 24 mL/min — ABNORMAL LOW (ref >60)
GFR calc non Af Amer: 21 mL/min — ABNORMAL LOW (ref >60)
Glucose, Bld: 114 mg/dL — ABNORMAL HIGH (ref 70–99)
Potassium: 3.5 mmol/L (ref 3.5–5.1)
Sodium: 135 mmol/L (ref 135–145)

## 2018-08-24 LAB — CBC
HCT: 26.4 % — ABNORMAL LOW (ref 36.0–46.0)
Hemoglobin: 8.1 g/dL — ABNORMAL LOW (ref 12.0–15.0)
MCH: 28.4 pg (ref 26.0–34.0)
MCHC: 30.7 g/dL (ref 30.0–36.0)
MCV: 92.6 fL (ref 80.0–100.0)
Platelets: 196 10*3/uL (ref 150–400)
RBC: 2.85 MIL/uL — ABNORMAL LOW (ref 3.87–5.11)
RDW: 14.5 % (ref 11.5–15.5)
WBC: 9.9 10*3/uL (ref 4.0–10.5)
nRBC: 0 % (ref 0.0–0.2)

## 2018-08-24 LAB — LIPID PANEL
Cholesterol: 84 mg/dL (ref 0–200)
HDL: 27 mg/dL — ABNORMAL LOW (ref >40)
LDL Cholesterol: 45 mg/dL (ref 0–99)
Total CHOL/HDL Ratio: 3.1 RATIO
Triglycerides: 60 mg/dL (ref <150)
VLDL: 12 mg/dL (ref 0–40)

## 2018-08-24 LAB — MAGNESIUM: Magnesium: 1.9 mg/dL (ref 1.7–2.4)

## 2018-08-24 LAB — HEMOGLOBIN: Hemoglobin: 8.8 g/dL — ABNORMAL LOW (ref 12.0–15.0)

## 2018-08-24 LAB — TROPONIN I
TROPONIN I: 0.09 ng/mL — AB (ref <0.03)
Troponin I: 0.08 ng/mL (ref <0.03)
Troponin I: 0.07 ng/mL (ref <0.03)

## 2018-08-24 MED ORDER — BUDESONIDE 0.25 MG/2ML IN SUSP
0.2500 mg | Freq: Two times a day (BID) | RESPIRATORY_TRACT | Status: DC
  Administered 2018-08-24 – 2018-08-26 (×4): 0.25 mg via RESPIRATORY_TRACT
  Filled 2018-08-24 (×5): qty 2

## 2018-08-24 MED ORDER — ROPINIROLE HCL 0.25 MG PO TABS
0.2500 mg | ORAL_TABLET | Freq: Three times a day (TID) | ORAL | Status: DC
  Administered 2018-08-24 – 2018-08-26 (×7): 0.25 mg via ORAL
  Filled 2018-08-24 (×9): qty 1

## 2018-08-24 MED ORDER — ALPRAZOLAM 0.5 MG PO TABS: 0.5000 mg | ORAL_TABLET | Freq: Two times a day (BID) | ORAL | Status: DC | PRN

## 2018-08-24 MED ORDER — PIPERACILLIN-TAZOBACTAM 3.375 G IVPB
3.3750 g | Freq: Two times a day (BID) | INTRAVENOUS | Status: DC
  Administered 2018-08-24 – 2018-08-25 (×4): 3.375 g via INTRAVENOUS
  Filled 2018-08-24 (×4): qty 50

## 2018-08-24 MED ORDER — ADULT MULTIVITAMIN W/MINERALS CH
1.0000 | ORAL_TABLET | Freq: Every day | ORAL | Status: DC
  Administered 2018-08-25: 1 via ORAL
  Filled 2018-08-24: qty 1

## 2018-08-24 MED ORDER — LEVOTHYROXINE SODIUM 100 MCG PO TABS
100.0000 ug | ORAL_TABLET | Freq: Every day | ORAL | Status: DC
  Administered 2018-08-24 – 2018-08-26 (×3): 100 ug via ORAL
  Filled 2018-08-24 (×3): qty 1

## 2018-08-24 MED ORDER — ESCITALOPRAM OXALATE 10 MG PO TABS
10.0000 mg | ORAL_TABLET | Freq: Every day | ORAL | Status: DC
  Administered 2018-08-24 – 2018-08-25 (×2): 10 mg via ORAL
  Filled 2018-08-24 (×3): qty 1

## 2018-08-24 MED ORDER — FLUTICASONE PROPIONATE 50 MCG/ACT NA SUSP
2.0000 | NASAL | Status: DC | PRN
  Filled 2018-08-24: qty 16

## 2018-08-24 MED ORDER — PANTOPRAZOLE SODIUM 40 MG PO TBEC
40.0000 mg | DELAYED_RELEASE_TABLET | Freq: Two times a day (BID) | ORAL | Status: DC
  Administered 2018-08-24 – 2018-08-26 (×5): 40 mg via ORAL
  Filled 2018-08-24 (×5): qty 1

## 2018-08-24 MED ORDER — ACETAMINOPHEN 325 MG PO TABS
650.0000 mg | ORAL_TABLET | Freq: Three times a day (TID) | ORAL | Status: DC | PRN
  Administered 2018-08-24 – 2018-08-25 (×3): 650 mg via ORAL
  Filled 2018-08-24 (×3): qty 2

## 2018-08-24 MED ORDER — NEPRO/CARBSTEADY PO LIQD
237.0000 mL | ORAL | Status: DC
  Administered 2018-08-25 – 2018-08-26 (×2): 237 mL via ORAL

## 2018-08-24 MED ORDER — VITAMIN D3 25 MCG (1000 UNIT) PO TABS
1000.0000 [IU] | ORAL_TABLET | Freq: Every day | ORAL | Status: DC
  Administered 2018-08-24 – 2018-08-26 (×3): 1000 [IU] via ORAL
  Filled 2018-08-24 (×3): qty 1

## 2018-08-24 MED ORDER — IPRATROPIUM-ALBUTEROL 0.5-2.5 (3) MG/3ML IN SOLN: 3.0000 mL | Freq: Four times a day (QID) | RESPIRATORY_TRACT | Status: DC | PRN

## 2018-08-24 MED ORDER — BENZONATATE 100 MG PO CAPS: 200.0000 mg | ORAL_CAPSULE | Freq: Three times a day (TID) | ORAL | Status: DC | PRN

## 2018-08-24 MED ORDER — APIXABAN 2.5 MG PO TABS
2.5000 mg | ORAL_TABLET | Freq: Two times a day (BID) | ORAL | Status: DC
  Administered 2018-08-24 (×2): 2.5 mg via ORAL
  Filled 2018-08-24 (×2): qty 1

## 2018-08-24 MED ORDER — TEMAZEPAM 7.5 MG PO CAPS
7.5000 mg | ORAL_CAPSULE | Freq: Every evening | ORAL | Status: DC | PRN
  Administered 2018-08-24 – 2018-08-25 (×3): 7.5 mg via ORAL
  Filled 2018-08-24 (×10): qty 1

## 2018-08-24 MED ORDER — VITAMIN B-12 1000 MCG PO TABS
1000.0000 ug | ORAL_TABLET | Freq: Every day | ORAL | Status: DC
  Administered 2018-08-24 – 2018-08-26 (×3): 1000 ug via ORAL
  Filled 2018-08-24 (×3): qty 1

## 2018-08-24 MED ORDER — APIXABAN 2.5 MG PO TABS
2.5000 mg | ORAL_TABLET | Freq: Two times a day (BID) | ORAL | Status: DC
  Administered 2018-08-24 – 2018-08-26 (×4): 2.5 mg via ORAL
  Filled 2018-08-24 (×4): qty 1

## 2018-08-24 MED ORDER — ENSURE ENLIVE PO LIQD
237.0000 mL | ORAL | Status: DC
  Administered 2018-08-24 – 2018-08-25 (×2): 237 mL via ORAL

## 2018-08-24 MED ORDER — POTASSIUM CHLORIDE CRYS ER 20 MEQ PO TBCR
40.0000 meq | EXTENDED_RELEASE_TABLET | Freq: Once | ORAL | Status: AC
  Administered 2018-08-24: 40 meq via ORAL
  Filled 2018-08-24: qty 2

## 2018-08-24 MED ORDER — DOCUSATE SODIUM 100 MG PO CAPS: 100.0000 mg | ORAL_CAPSULE | Freq: Two times a day (BID) | ORAL | Status: DC | PRN

## 2018-08-24 NOTE — Progress Notes (Signed)
Pharmacy Antibiotic Note  SHINITA MAC is a 85 y.o. female admitted on 08/23/2018 with Intra-abdominal .  Pharmacy has been consulted for piperacillin/tazobactam dosing.  Plan: Zosyn 3.375g IV q12h (4 hour infusion).  Height: 5' (152.4 cm) Weight: 134 lb (60.8 kg) IBW/kg (Calculated) : 45.5  Temp (24hrs), Avg:98 F (36.7 C), Min:97.7 F (36.5 C), Max:98.2 F (36.8 C)  Recent Labs  Lab 08/23/18 1254 08/24/18 0438  WBC 14.6* 9.9  CREATININE 2.05* 2.07*    Estimated Creatinine Clearance: 15.6 mL/min (A) (by C-G formula based on SCr of 2.07 mg/dL (H)).    Allergies  Allergen Reactions  . Ace Inhibitors Cough  . Benicar [Olmesartan Medoxomil]     Unsure of reaction type  . Clarithromycin Other (See Comments)    Blisters in mouth  . Clarithromycin Other (See Comments)    Unknown   . Norvasc [Amlodipine Besylate]   . Levofloxacin Rash  . Zithromax [Azithromycin Dihydrate] Rash    Antimicrobials this admission: 1/16 pip/tazo >>   Dose adjustments this admission: Adjusted pip/tazo per pt's renal function  Microbiology results: None  Thank you for allowing pharmacy to be a part of this patient's care.  Oswald Hillock, PharmD, BCPS Clinical Pharmacist 08/24/2018 1:14 PM

## 2018-08-24 NOTE — Care Management Note (Signed)
Case Management Note  Patient Details  Name: Savannah Wilson MRN: [id number] Date of Birth: 13-Mar-1929  Subjective/Objective:     Patient is from home alone.  She was admitted with atrial fibrillation; abdominal pain.  Hx of diverticulitis.  Receiving IV antibiotics.  Takes Eliquis daily.  Denies difficulties obtaining medications at [place] or with accessing medical care.  She has a walker at home next to bed but does not use.  She is current with her PCP.  She continue to drive and is independent.  She has a close neighbor that checks on her daily.  She is current declining home health services.   No further needs  identified at this time.   RNCM will be available for assistance with discharge needs.                  Action/Plan:   Expected Discharge Date:                  Expected Discharge Plan:  Home/Self Care  In-House Referral:     Discharge planning Services  CM Consult  Post Acute Care Choice:    Choice offered to:     DME Arranged:    DME Agency:     HH Arranged:  Patient Refused [place] Agency:     Status of Service:  In process, will continue to follow  If discussed at Long Length of Stay Meetings, dates discussed:    Additional Comments:  Elza Rafter, RN 08/24/2018, 12:41 PM

## 2018-08-24 NOTE — Progress Notes (Signed)
Initial troponin 0.09. No complaints of chest pain. Patient resting comfortably in bed at this time. MD Salary notified. No new orders at this time. Will continue to monitor.   Savannah Wilson M

## 2018-08-24 NOTE — Progress Notes (Signed)
Initial Nutrition Assessment  DOCUMENTATION CODES:   Not applicable  INTERVENTION:   Ensure Enlive po daily, each supplement provides 350 kcal and 20 grams of protein  Nepro Shake po daily, each supplement provides 425 kcal and 19 grams protein  MVI daily   Liberalize diet   NUTRITION DIAGNOSIS:   Inadequate oral intake related to acute illness as evidenced by per patient/family report.  GOAL:   Patient will meet greater than or equal to 90% of their needs  MONITOR:   PO intake, Supplement acceptance, Labs, Weight trends, Skin, I & O's  REASON FOR ASSESSMENT:   Malnutrition Screening Tool    ASSESSMENT:   85 y.o. female with a known history of asthma, Barrett's esophagus, chronic kidney disease stage IV, diverticulosis, gout, hyperlipidemia, hypertension, hypothyroidism, mitral regurgitation, ejection fraction 55 to 60%, pancreatitis, persistent atrial fibrillation status post cardioversion on Eliquis admitted with nausea, vomiting and abdominal pain    Met with pt and family in room today. Pt reports poor appetite and oral intake at baseline; reports she does not eat large quantities. Pt with h/o Barrett's esophagus but denies any trouble swallowing. Pt does not drink any supplements at home. Pt eating 25% of meals in hospital; pt upset that she can not have any salt. RD will add Ensure and Nepro supplements to help pt meet her estimated needs (pt likes strawberry and butter pecan flavors). RD will also liberalize pt's diet as pt not likely eating enough to exceed any nutrient limits; will hold salt packs off trays. Per chart, pt with 8lb(6%) wt loss over the past six weeks; pt aware of weight loss reports some weight r/t fluid changes. RD will continue to monitor pt's intakes.    Medications reviewed and include: vitamin D, synthroid, protonix, KCl, B12, zosyn  Labs reviewed: BUN 52(H), creat 2.07(H) Hgb 8.1(L), Hct 26.4(L)  NUTRITION - FOCUSED PHYSICAL EXAM:    Most  Recent Value  Orbital Region  No depletion  Upper Arm Region  No depletion  Thoracic and Lumbar Region  No depletion  Buccal Region  No depletion  Temple Region  No depletion  Clavicle Bone Region  No depletion  Clavicle and Acromion Bone Region  No depletion  Scapular Bone Region  No depletion  Dorsal Hand  Mild depletion  Patellar Region  Mild depletion  Anterior Thigh Region  No depletion  Posterior Calf Region  No depletion  Edema (RD Assessment)  None  Hair  Reviewed  Eyes  Reviewed  Mouth  Reviewed  Skin  Reviewed  Nails  Reviewed     Diet Order:   Diet Order            Diet Heart Room service appropriate? Yes; Fluid consistency: Thin  Diet effective now             EDUCATION NEEDS:   Education needs have been addressed  Skin:  Skin Assessment: Reviewed RN Assessment  Last BM:  1/14  Height:   Ht Readings from Last 1 Encounters:  08/23/18 5' (1.524 m)    Weight:   Wt Readings from Last 1 Encounters:  08/23/18 60.8 kg    Ideal Body Weight:  45.45 kg  BMI:  Body mass index is 26.17 kg/m.  Estimated Nutritional Needs:   Kcal:  1300-1500kcal/day   Protein:  60-67g/day   Fluid:  1.1L/day   Koleen Distance MS, RD, LDN Pager #- [phone] Office#- [phone] After Hours Pager: [phone]

## 2018-08-24 NOTE — Progress Notes (Signed)
*  PRELIMINARY RESULTS* Echocardiogram 2D Echocardiogram has been performed.  Savannah Wilson 08/24/2018, 3:23 PM

## 2018-08-24 NOTE — Progress Notes (Signed)
[facility] [place] NAME: Caysie Minnifield    MR#:  [id number]  DATE OF BIRTH:  1929/03/01  SUBJECTIVE:  CHIEF COMPLAINT:   Chief Complaint  Patient presents with  . Abdominal Pain   Continues to have diffuse abd pain No vomiting last Bm 4 days back. No vomiting No dysuria/hematuria.  On cardizem drip.   REVIEW OF SYSTEMS:    Review of Systems  Constitutional: Positive for malaise/fatigue. Negative for chills and fever.  HENT: Negative for sore throat.   Eyes: Negative for blurred vision, double vision and pain.  Respiratory: Negative for cough, hemoptysis, shortness of breath and wheezing.   Cardiovascular: Negative for chest pain, palpitations, orthopnea and leg swelling.  Gastrointestinal: Negative for abdominal pain, constipation, diarrhea, heartburn, nausea and vomiting.  Genitourinary: Negative for dysuria and hematuria.  Musculoskeletal: Negative for back pain and joint pain.  Skin: Negative for rash.  Neurological: Negative for sensory change, speech change, focal weakness and headaches.  Endo/Heme/Allergies: Does not bruise/bleed easily.  Psychiatric/Behavioral: Negative for depression. The patient is not nervous/anxious.     DRUG ALLERGIES:   Allergies  Allergen Reactions  . Ace Inhibitors Cough  . Benicar [Olmesartan Medoxomil]     Unsure of reaction type  . Clarithromycin Other (See Comments)    Blisters in mouth  . Clarithromycin Other (See Comments)    Unknown   . Norvasc [Amlodipine Besylate]   . Levofloxacin Rash  . Zithromax [Azithromycin Dihydrate] Rash    VITALS:  Blood pressure (!) 98/43, pulse 70, temperature 98.2 F (36.8 C), temperature source Oral, resp. rate 20, height 5' (1.524 m), weight 60.8 kg, SpO2 97 %.  PHYSICAL EXAMINATION:   Physical Exam  GENERAL:  85 y.o.-year-old patient lying in the bed with no acute distress.  EYES: Pupils equal, round, reactive to light and accommodation. No  scleral icterus. Extraocular muscles intact.  HEENT: Head atraumatic, normocephalic. Oropharynx and nasopharynx clear.  NECK:  Supple, no jugular venous distention. No thyroid enlargement, no tenderness.  LUNGS: Normal breath sounds bilaterally, no wheezing, rales, rhonchi. No use of accessory muscles of respiration.  CARDIOVASCULAR: S1, S2 normal. No murmurs, rubs, or gallops.  ABDOMEN: Soft, tender diffusely, nondistended. Bowel sounds present. No organomegaly or mass. No gaurding EXTREMITIES: No cyanosis, clubbing or edema b/l.    NEUROLOGIC: Cranial nerves II through XII are intact. No focal Motor or sensory deficits b/l.   PSYCHIATRIC: The patient is alert and oriented x 3.  SKIN: No obvious rash, lesion, or ulcer.   LABORATORY PANEL:   CBC Recent Labs  Lab 08/24/18 0438  WBC 9.9  HGB 8.1*  HCT 26.4*  PLT 196   ------------------------------------------------------------------------------------------------------------------ Chemistries  Recent Labs  Lab 08/23/18 1254 08/24/18 0438  NA 133* 135  K 4.1 3.5  CL 103 107  CO2 21* 23  GLUCOSE 107* 114*  BUN 51* 52*  CREATININE 2.05* 2.07*  CALCIUM 8.5* 7.8*  MG  --  1.9  AST 16  --   ALT 10  --   ALKPHOS 81  --   BILITOT 2.9*  --    ------------------------------------------------------------------------------------------------------------------  Cardiac Enzymes Recent Labs  Lab 08/24/18 1058  TROPONINI 0.07*   ------------------------------------------------------------------------------------------------------------------  RADIOLOGY:  Ct Abdomen Pelvis Wo Contrast  Result Date: 08/23/2018 CLINICAL DATA:  Abdominal pain for 2 days.  Vomiting. EXAM: CT ABDOMEN AND PELVIS WITHOUT CONTRAST TECHNIQUE: Multidetector CT imaging of the abdomen and pelvis was performed following the standard protocol without IV contrast.  COMPARISON:  CT abdomen pelvis 06/21/2013 FINDINGS: Lower chest: Heart is enlarged. Moderate sized  hiatal hernia. Small bilateral pleural effusions. Subpleural consolidation within the right greater than left lower lobes. Hepatobiliary: The liver is normal in size and contour. Prior cholecystectomy. Pancreas: Unremarkable Spleen: Unremarkable Adrenals/Urinary Tract: Normal adrenal glands. Unchanged 1.1 cm exophytic lesion superior pole right kidney. Interval increase in size of 3.3 cm exophytic lesion interpolar region left kidney (image 33; series 2) with an internal density of 21 Hounsfield units. Small amount of gas in the urinary bladder. Stomach/Bowel: Small volume ascites. Diverticuli involving the small bowel. Descending and sigmoid colonic diverticulosis. No CT evidence for acute diverticulitis. No evidence for small bowel obstruction. Vascular/Lymphatic: Normal caliber abdominal aorta. Peripheral calcified atherosclerotic plaque. No retroperitoneal lymphadenopathy. Reproductive: Status post hysterectomy. Other: None. Musculoskeletal: Lumbar spine degenerative changes. No aggressive or acute appearing osseous lesions. IMPRESSION: 1. Small volume ascites within the abdomen. 2. Small bowel and large bowel diverticulosis without evidence for acute diverticulitis. 3. Small bilateral pleural effusions, right-greater-than-left. 4. Small amount of gas in the urinary bladder, correlate for recent instrumentation. Electronically Signed   By: Lovey Newcomer M.D.   On: 08/23/2018 18:52     ASSESSMENT AND PLAN:   *Atrial fibrillation with rapid ventricular response On Eliquis.  Will stop Cardizem drip.  Discussed with Dr. Rockey Situ of cardiology.  Telemetry monitoring. Borderline low blood pressure.  No oral medications at this time.  *Abdominal pain CT scan abdomen does not show anything acute other than diverticulosis, small ascites.  Some air in the bladder Waiting for urinalysis.  Discussed with nursing staff. Will add IV Zofran due to unexplained symptoms but some leukocytosis on admission. ??   Diverticulitis ??  UTI  *Hypertension As blood pressure is borderline, I will hold home medications for now and keep monitoring.  *COPD Continue home inhalers and nebulizers for now.  *GERD and Barrett's esophagus Continue PPI twice daily.  *Hypothyroidism Continue levothyroxine and check TSH.  *Chronic renal failure stage IV Appears to be stable at this time.  Continue to monitor.   All the records are reviewed and case discussed with Care Management/Social Workerr. Management plans discussed with the patient, family and they are in agreement.  CODE STATUS: DNR   DVT Prophylaxis: SCDs  TOTAL TIME TAKING CARE OF THIS PATIENT: 30 minutes.   POSSIBLE D/C IN 1-2 DAYS, DEPENDING ON CLINICAL CONDITION.  Leia Alf Sheng Pritz M.D on 08/24/2018 at 3:36 PM  Between 7am to 6pm - Pager - [phone]  After 6pm go to [email or web address] - password [email or web address] [facility]  Office  [phone]  CC: Primary care physician; [name], MD  Note: This dictation was prepared with Dragon dictation along with smaller phrase technology. Any transcriptional errors that result from this process are unintentional.

## 2018-08-24 NOTE — Consult Note (Signed)
Cardiology Consultation:   Patient ID: Savannah Wilson MRN: [id number]; DOB: 04/04/1929  Admit date: 08/23/2018 Date of Consult: 08/24/2018  Primary Care Provider: [name], Teresa L, MD Primary Cardiologist:Dr. Arida Primary Electrophysiologist:  None    Patient Profile:   Savannah Wilson is a 85 y.o. female with a hx of persistent Afib (Eliquis), HFpEF (EF 55-60%), HTN, CAD (treated medically), CKDIV (followed by [facility]) and associated anemia of chronic disease, asthma/COPD, hypothyroidism, and Barrett's esophagus who is being seen today for the evaluation of Afib with RVR at the request of Dr. Vachhani.  History of Present Illness:   Savannah Wilson is an 85-year-old female who was last seen by [facility] 11/26/2019for follow-up regarding paroxysmal atrial fibrillation and chronic diastolic heart failure.    04/17/2017: NM stress testing.  She experienced severe HTN with stress testing that showed prior inferior/inferior lateral infarct with minimal peri-infarct ischemia. Since then, she has been treated medically for likely underlying CAD, given her age and advanced CKD, as well as lack of anginal sx.   03/28/2017: DCCV with successful cardioversion to SR.  04/18/2017 TTE showed normal LV systolic function 55-60% with inferior wall hypokinesis, G2DD, moderate MR, and moderate pulmonary hypertension with PASP of 52 mmHg. This echo was performed while admitted 04/2017 d/t volume overload and improved slowly with IV diuresis, which was limited by her CKD.   Most recent or previous [facility] admission 09/19/2017 after she presented with c/o generalized weakness and dizziness to [facility] and was found to be in ARF. She was treated with IVF with improvement in renal function.   Last outpatient [facility] cardiology visit 07/03/2018. At that time, she had been attending pulmonary rehab with overall improvement in stamina.  Other than HTN, she had no cardiac complaints.  Plan was for continued medical management. It was  noted that small dose metoprolol was discontinued during a previous visit due to symptomatic bradycardia.  She is followed by [facility] and [facility]. Of note, on 08/09/2018, orders placed for steroids and Augmentin. Patient stated she was treated with steroids due to DOE for her chronic bronchitis and COPD. She confirmed she was improving in pulmonary rehab.  On 08/23/2018, patient presented to [facility] ED with complaint of abdominal pain x2 days.  On 08/21/2018, the patient reportedly started to have severe abdominal pain and nausea. associated with emesis x3. She described the pain as constant, severe, and nonradiating. No reported aggravating or alleviating factors.  Associated symptoms included overall malaise/weakness.  Last bowel movement was reported as occurring on 08/21/2018 and no blood was noted in her stool or urine.  She confirmed medication compliance, including taking her Eliquis twice daily. She denied cardiac symptoms of chest pain, palpitations, or shortness of breath.  According to the patient, she was having a visit at [facility] with Dr. Kolloru when it was noted her heart rate was elevated, which lead to her presenting to [facility] ED 08/23/2018.    In the ED, it was noted in ED documentation that the patient was in A. fib with RVR ventricular rate up to 140bpm (but no EKG per review of EMR).  She was started on IV Cardizem drip with some improvement of ventricular rate. Labs showed minimally elevated troponin 0.09  0.08, likely due to supply demand ischemia in the setting of rapid ventricular rate. Other significant lab values included K 4.1, Cr 2.05, Hgb 10.3, TSH 0.731. No new A1C with most recent A1C 5.0 in 06/2016. Most recent LDL 64 in 2017.   CT showed ascites, diverticulitis, small bilateral pleural effusions with right greater than left, and small amount of gas in the urinary bladder.  Pending EKG, TTE, and updated labs.  Past Medical History:  Diagnosis Date  .  Asthma   . Barrett esophagus   . Chronic bronchitis   . Chronic kidney disease (CKD), stage IV (severe) (HCC)   . Depression   . Diverticulosis   . GERD (gastroesophageal reflux disease)   . Gout   . Hyperlipidemia   . Hypertension   . Hypothyroid   . Mitral regurgitation    a. TTE 02/2017: EF 55-60%, normal WM, calcified mitral annulus with mod regurg, moderately dilated LA, unable to estimate PASP  . Pancreatitis due to biliary obstruction March 2010   s/p ERCP sphincterotomy, cholecystectomy  . Peripheral neuralgia   . Persistent atrial fibrillation    a. diagnosed 01/24/17; CHADS2VASc => 5 (HTN, age x 2, vascular disease with PAD and aortic plaque, female) giving her an estimated annual stroke risk of 6.7%; b. successful DCCV 03/28/2017; c. on Eliquis  . Venous insufficiency     Past Surgical History:  Procedure Laterality Date  . ABDOMINAL HYSTERECTOMY    . CARDIOVERSION N/A 03/28/2017   Procedure: CARDIOVERSION;  Surgeon: End, Christopher, MD;  Location: [facility] ORS;  Service: Cardiovascular;  Laterality: N/A;  . CARPAL TUNNEL RELEASE  jan 2013   Chris Smith  . CATARACT EXTRACTION     right  . CATARACT EXTRACTION  2008  . CHOLECYSTECTOMY  02/10  . CHOLECYSTECTOMY  2010  . TONSILLECTOMY    . VENTRAL HERNIA REPAIR    . VENTRAL HERNIA REPAIR  2007  . VESICOVAGINAL FISTULA CLOSURE W/ TAH       Home Medications:  Prior to Admission medications   Medication Sig Start Date End Date Taking? Authorizing Provider  acetaminophen (TYLENOL) 650 MG CR tablet Take 650 mg by mouth every 8 (eight) hours as needed for pain.    Yes [provider]  ALPRAZolam (XANAX) 0.5 MG tablet Take 1 tablet (0.5 mg total) by mouth 2 (two) times daily as needed for anxiety. 02/07/18  Yes [name], Teresa L, MD  apixaban (ELIQUIS) 2.5 MG TABS tablet Take 1 tablet (2.5 mg total) by mouth 2 (two) times daily. 02/12/18  Yes Arida, Muhammad A, MD  budesonide (PULMICORT) 0.25 MG/2ML nebulizer solution USE ONE  (1) VIAL IN NEBULIZER TWO TIMES PER DAY 08/21/18  Yes [name], Teresa L, MD  cetirizine (ZYRTEC ALLERGY) 10 MG tablet Take 10 mg by mouth daily.   Yes [provider]  Cholecalciferol (VITAMIN D-3) 1000 units CAPS Take 1,000 Units by mouth daily.    Yes [provider]  escitalopram (LEXAPRO) 10 MG tablet Take 1 tablet (10 mg total) by mouth daily. After dinner 01/29/18  Yes [name], Teresa L, MD  fluticasone (FLONASE) 50 MCG/ACT nasal spray Place 2 sprays into the nose daily. Patient taking differently: Place 2 sprays into the nose daily as needed (For allergies.).  01/30/13  Yes [name], Teresa L, MD  furosemide (LASIX) 40 MG tablet Take 40 mg by mouth daily.   Yes [provider]  hydrALAZINE (APRESOLINE) 50 MG tablet TAKE ONE TABLET BY MOUTH 3 TIMES DAILY 08/15/18  Yes Arida, Muhammad A, MD  ipratropium-albuterol (DUONEB) 0.5-2.5 (3) MG/3ML SOLN Take 3 mLs by nebulization every 6 (six) hours as needed. 08/15/17  Yes [name], Teresa L, MD  levothyroxine (SYNTHROID, LEVOTHROID) 100 MCG tablet TAKE ONE TABLET BY MOUTH EVERY DAY FOR THYROID   06/13/18  Yes [name], MD  losartan (COZAAR) 50 MG tablet TAKE ONE TABLET EVERY DAY 08/09/18  Yes [name], MD  mupirocin ointment (BACTROBAN) 2 % Place 1 application into the nose 2 (two) times daily. 05/01/18  Yes [name], MD  omeprazole (PRILOSEC) 40 MG capsule TAKE ONE CAPSULE BY MOUTH TWICE A DAY. BEFORE BREAKFAST AND SUPPER 03/13/18  Yes [name], MD  PROAIR HFA 108 (90 BASE) MCG/ACT inhaler INHALE 2 PUFFS EVERY 6 HOURS AS NEEDED FOR WHEEZING 03/13/15  Yes [name], MD  rOPINIRole (REQUIP) 0.25 MG tablet TAKE ONE TABLET 3 TIMES DAILY 07/26/18  Yes [name], MD  Spacer/Aero-Holding Chambers ([place]) [place] 1 each by Other route once. Always uses her when you're using a metered-dose inhaler. You've aromatase medicine as much, he won't have his much side effect, but you it twice as much medicine and  your lungs. 06/08/15  Yes Ahmed Prima, MD  temazepam (RESTORIL) 15 MG capsule TAKE 1 CAPSULE BY MOUTH AT BEDTIME AS NEEDED FOR SLEEP 06/13/18  Yes [name], MD  vitamin B-12 (CYANOCOBALAMIN) 1000 MCG tablet Take 1,000 mcg by mouth daily.   Yes [provider]  amoxicillin-clavulanate (AUGMENTIN) 875-125 MG tablet Take 1 tablet by mouth 2 (two) times daily. Patient not taking: Reported on 08/23/2018 08/09/18   [name], MD  benzonatate (TESSALON) 200 MG capsule Take 1 capsule (200 mg total) by mouth 3 (three) times daily as needed for cough. Patient not taking: Reported on 08/23/2018 08/03/17   [name], MD  chlorpheniramine-HYDROcodone [facility] PENNKINETIC ER) 10-8 MG/5ML SUER Take 5 mLs by mouth at bedtime as needed for cough. Patient not taking: Reported on 08/23/2018 08/09/18   [name], MD  predniSONE (DELTASONE) 10 MG tablet 6 tablets on Day 1 , then reduce by 1 tablet daily until gone Patient not taking: Reported on 08/23/2018 08/09/18   [name], MD    Inpatient Medications: Scheduled Meds: . apixaban  2.5 mg Oral BID  . budesonide  0.25 mg Nebulization BID  . cholecalciferol  1,000 Units Oral Daily  . escitalopram  10 mg Oral QPC supper  . levothyroxine  100 mcg Oral Q0600  . pantoprazole  40 mg Oral BID AC  . rOPINIRole  0.25 mg Oral TID  . sodium chloride flush  3 mL Intravenous Once  . vitamin B-12  1,000 mcg Oral Daily   Continuous Infusions: . diltiazem (CARDIZEM) infusion 5 mg/hr (08/23/18 2203)   PRN Meds: acetaminophen, ALPRAZolam, benzonatate, docusate sodium, fluticasone, ipratropium-albuterol, ondansetron (ZOFRAN) IV, temazepam  Allergies:    Allergies  Allergen Reactions  . Ace Inhibitors Cough  . Benicar [Olmesartan Medoxomil]     Unsure of reaction type  . Clarithromycin Other (See Comments)    Blisters in mouth  . Clarithromycin Other (See Comments)    Unknown   . Norvasc [Amlodipine Besylate]   . Levofloxacin  Rash  . Zithromax [Azithromycin Dihydrate] Rash    Social History:   Social History   Socioeconomic History  . Marital status: Widowed    Spouse name: Not on file  . Number of children: 2  . Years of education: Not on file  . Highest education level: Not on file  Occupational History  . 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 Financial resource strain: Not hard at all  . Food insecurity:    Worry: Never true  Inability: Never true  . Transportation needs:    Medical: No    Non-medical: No  Tobacco Use  . Smoking status: Never Smoker  . Smokeless tobacco: Never Used  Substance and Sexual Activity  . Alcohol use: No    Comment: rare  . Drug use: No  . Sexual activity: Never  Lifestyle  . Physical activity:    Days per week: Not on file    Minutes per session: Not on file  . Stress: Not on file  Relationships  . Social connections:    Talks on phone: Not on file    Gets together: Not on file    Attends religious service: Not on file    Active member of club or organization: Not on file    Attends meetings of clubs or organizations: Not on file    Relationship status: Not on file  . Intimate partner violence:    Fear of current or ex partner: Not on file    Emotionally abused: Not on file    Physically abused: Not on file    Forced sexual activity: Not on file  Other Topics Concern  . Not on file  Social History Narrative   ** Merged History Encounter **       Widowed. Has 2 children and lives alone.   Lives by herself. Has a cane, but ambulates independently    Family History:   Family History  Problem Relation Age of Onset  . Kidney failure Mother   . Hypertension Mother   . Kidney disease Mother   . Multiple myeloma Daughter   . Cancer Daughter        multiple myeloma  . Kidney disease Daughter   . Heart disease Father   . Rheumatologic disease Father   . Kidney cancer Father      ROS:  Please see the history of present  illness.  Review of Systems  Constitutional: Positive for malaise/fatigue.  HENT: Positive for hearing loss.        Slightly hard of hearing  Cardiovascular: Negative for chest pain, palpitations and leg swelling.       No LEE reported but exam does show minimal ankle edema  Gastrointestinal: Positive for abdominal pain, nausea and vomiting. Negative for blood in stool, constipation, diarrhea and melena.       No bowel movement since 1/14  Genitourinary: Negative for dysuria, flank pain and hematuria.  Musculoskeletal: Negative for falls.  Neurological: Negative for dizziness and loss of consciousness.  Psychiatric/Behavioral: Negative for substance abuse.  All other systems reviewed and are negative.   All other ROS reviewed and negative.     Physical Exam/Data:   Vitals:   08/23/18 2343 08/24/18 0008 08/24/18 0316 08/24/18 0729  BP: (!) 111/53 (!) 111/49 (!) 106/54 (!) 92/49  Pulse: 88 75 75 70  Resp: 17 18 18 20  Temp:  97.7 F (36.5 C) 98 F (36.7 C) 98.2 F (36.8 C)  TempSrc:  Oral  Oral  SpO2: 96% 95% 94% 97%  Weight:      Height:        Intake/Output Summary (Last 24 hours) at 08/24/2018 0815 Last data filed at 08/24/2018 0000 Gross per 24 hour  Intake 1059.71 ml  Output -  Net 1059.71 ml   Filed Weights   08/23/18 1252  Weight: 60.8 kg   Body mass index is 26.17 kg/m.  General:  Elderly female. Lying in bed in some discomfort d/t abdomen  HEENT: normal   Neck: no JVD Vascular: No carotid bruits; FA pulses 2+ bilaterally without bruits  Cardiac:  normal S1, S2; IRIR; 1/6 systolic murmur  Lungs:  Reduced breath sounds bilaterally and most notable reduced breath sounds at bases R>L  Abd: soft, nontender, no hepatomegaly  Ext: Minimal to 1+ LEE present  bilaterally Musculoskeletal:  No deformities, BUE and BLE strength normal and equal Skin: warm and dry  Neuro:  no focal abnormalities noted Psych:  Normal affect   EKG:  The EKG was personally reviewed and  demonstrates:  Not in EMR.  Ordered and pending. Telemetry:  Telemetry was personally reviewed and demonstrates:  Afib with rate controlled 60-80s  Relevant CV Studies:  Pending updated TTE  04/18/2017 TTE - Left ventricle: The cavity size was normal. There was mild focal   basal and mild concentric hypertrophy of the septum. Systolic   function was normal. The estimated ejection fraction was in the   range of 55% to 60%. Hypokinesis of the inferior myocardium.   Features are consistent with a pseudonormal left ventricular   filling pattern, with concomitant abnormal relaxation and   increased filling pressure (grade 2 diastolic dysfunction). - Mitral valve: There was moderate regurgitation. - Left atrium: The atrium was moderately dilated. - Right ventricle: Systolic function was normal. - Atrial septum: A patent foramen ovale cannot be excluded. - Pulmonary arteries: Systolic pressure was moderately elevated. PA   peak pressure: 52 mm Hg (S).  04/17/2017 NM Study/ Lexi  T wave inversion was noted during stress in the III, V3 and V4 leads.  There was no ST segment deviation noted during stress.  Defect 1: There is a medium defect of severe severity present in the basal inferior, basal inferolateral, mid inferior and mid inferolateral location.  Findings consistent with prior inferior/inferolateral myocardial infarction with peri-infarct ischemia.  This is an intermediate risk study.  The left ventricular ejection fraction is normal (55-65%).  Severe hypotension with Lexiscan which was reversed with Aminophyllin. 500 mL of saline bolus was given with resolution of hypotension.  Laboratory Data:  Chemistry Recent Labs  Lab 08/23/18 1254 08/24/18 0438  NA 133* 135  K 4.1 3.5  CL 103 107  CO2 21* 23  GLUCOSE 107* 114*  BUN 51* 52*  CREATININE 2.05* 2.07*  CALCIUM 8.5* 7.8*  GFRNONAA 21* 21*  GFRAA 25* 24*  ANIONGAP 9 5    Recent Labs  Lab 08/23/18 1254  PROT  6.9  ALBUMIN 3.4*  AST 16  ALT 10  ALKPHOS 81  BILITOT 2.9*   Hematology Recent Labs  Lab 08/23/18 1254 08/24/18 0438  WBC 14.6* 9.9  RBC 3.62* 2.85*  HGB 10.3* 8.1*  HCT 33.3* 26.4*  MCV 92.0 92.6  MCH 28.5 28.4  MCHC 30.9 30.7  RDW 14.4 14.5  PLT 260 196   Cardiac Enzymes Recent Labs  Lab 08/24/18 0013 08/24/18 0438  TROPONINI 0.09* 0.08*   No results for input(s): TROPIPOC in the last 168 hours.  BNPNo results for input(s): BNP, PROBNP in the last 168 hours.  DDimer No results for input(s): DDIMER in the last 168 hours.  Radiology/Studies:  Ct Abdomen Pelvis Wo Contrast  Result Date: 08/23/2018 CLINICAL DATA:  Abdominal pain for 2 days.  Vomiting. EXAM: CT ABDOMEN AND PELVIS WITHOUT CONTRAST TECHNIQUE: Multidetector CT imaging of the abdomen and pelvis was performed following the standard protocol without IV contrast. COMPARISON:  CT abdomen pelvis 06/21/2013 FINDINGS: Lower chest: Heart is enlarged. Moderate sized hiatal hernia. Small bilateral  pleural effusions. Subpleural consolidation within the right greater than left lower lobes. Hepatobiliary: The liver is normal in size and contour. Prior cholecystectomy. Pancreas: Unremarkable Spleen: Unremarkable Adrenals/Urinary Tract: Normal adrenal glands. Unchanged 1.1 cm exophytic lesion superior pole right kidney. Interval increase in size of 3.3 cm exophytic lesion interpolar region left kidney (image 33; series 2) with an internal density of 21 Hounsfield units. Small amount of gas in the urinary bladder. Stomach/Bowel: Small volume ascites. Diverticuli involving the small bowel. Descending and sigmoid colonic diverticulosis. No CT evidence for acute diverticulitis. No evidence for small bowel obstruction. Vascular/Lymphatic: Normal caliber abdominal aorta. Peripheral calcified atherosclerotic plaque. No retroperitoneal lymphadenopathy. Reproductive: Status post hysterectomy. Other: None. Musculoskeletal: Lumbar spine  degenerative changes. No aggressive or acute appearing osseous lesions. IMPRESSION: 1. Small volume ascites within the abdomen. 2. Small bowel and large bowel diverticulosis without evidence for acute diverticulitis. 3. Small bilateral pleural effusions, right-greater-than-left. 4. Small amount of gas in the urinary bladder, correlate for recent instrumentation. Electronically Signed   By: Drew  Davis M.D.   On: 08/23/2018 18:52    Assessment and Plan:   Persistent Afib with RVR - Asymptomatic. No CP, SOB. CHA2DS2VASc score of at least 5 (HFpEF, HTN, agex2, vascular) on chronic anticoagulation with Eliquis. Previous cardioversion in 2018. Suspect held SR until now as 07/03/2018 EKG with Dr. Arida was NSR. Not on BB d/t symptomatic bradycardia on low dose metoprolol.  - No EKG on review of EMR from current admission. Ordered EKG - Hypokalemia. Replete with goal 4.0. Check Mg. Ordered AM BMET to monitor renal function and electrolytes with Cr stable at 2.07 (baseline Cr 2.0-2.5).  TSH checked: 0.731 and within range. Will order updated A1C.  - Continue Eliquis with close watch of Hgb as below.  - Not on medication for rate control since ED cardizem drip but rate currently controlled with current HR 60-80s. She also becomes symptomatic with bradycardia with lose dose BB. Pending EKG and MD to see patient. Continue to monitor telemetry. She will need outpatient follow-up with Dr. Arida after discharge.   Troponin elevation with h/o CAD - No CP or cardiac complaints. Troponin was minimally elevated and now trending down 0.09  0.08  0.07 in the setting of rapid ventricular rate but no EKG to r/o ACS. Ordered EKG. Previous NM study above with hypertensive response.  Currently treated medically for presumed underlying CAD with no recommendation for PCI so far given age and CKD. Will update 2017 LDL of 64 given recent abdominal pain and emesis. Will order EKG. TTE pending. No plans for invasive ischemic  evaluation at this time. Continue medical management with diuretics and ARB currently held d/t hypotension.   Chronic HFpEF - Euvolemic on exam. No SOB. Pending updated TTE. Previous TTE in CV studies above showing EF 55-60%, inferior wall hypokinesis, moderate MR, PASP 52 mmHg.  - Continue holding PTA diuretics 2/2 hypotension /hypokalemia likely d/t GI losses (emesis x3). Not on BB d/t symptomatic bradycardia with low dose metoprolol. Further recommendations pending updated TTE.  HTN - currently hypotensive - Currently SBP 90s. Continue to hold PTA losartan, lasix, and hydralazine with hypotension to avoid pre-renal AKI.  CKD IV  - Cr at baseline and 2.07. Hypokalemic. Followed by [facility]. 09/2017 admit to [facility] for ARF thought 2/2 diuretic as above in HPI.   - Continue holding PTA lasix, hydralazine, ARB to avoid prerenal AKI with hypotension. Daily BMET. Replete K to 4.0. Check Mg.  Anemia of chronic disease - Hgb dropped   from 10.3  8.1 overnight. Ordered AM CBC. Continue to monitor with daily CBC. Recommend transfusion below 8. Monitor closely. If further drop, will need r/o of bleed as patient is on Eliquis. Hold anticoagulation or antiplatelet with any concern for bleed.  Hypokalemia - 3 episodes of emesis (1/14). Likely etiology is GI loss. Overnight drop from 4.1  3.5. Replete K with goal 4.0. Check Mg. Daily BMET  Abdominal Pain with h/o diverticulitis - Per IM, GI. Started on abx.    For questions or updates, please contact [facility] Please consult [email or web address] for contact info under     Signed, Jacquelyn D Visser, PA-C  08/24/2018 8:15 AM   

## 2018-08-24 NOTE — Plan of Care (Signed)
  Problem: Activity: Goal: Risk for activity intolerance will decrease Outcome: Progressing   Problem: Elimination: Goal: Will not experience complications related to urinary retention Outcome: Progressing   Problem: Pain Managment: Goal: General experience of comfort will improve Outcome: Progressing   Problem: Safety: Goal: Ability to remain free from injury will improve Outcome: Progressing   Problem: Skin Integrity: Goal: Risk for impaired skin integrity will decrease Outcome: Progressing   Problem: Activity: Goal: Ability to tolerate increased activity will improve Outcome: Progressing   Problem: Cardiac: Goal: Ability to achieve and maintain adequate cardiopulmonary perfusion will improve Outcome: Progressing

## 2018-08-25 LAB — BASIC METABOLIC PANEL
Anion gap: 6 (ref 5–15)
BUN: 59 mg/dL — ABNORMAL HIGH (ref 8–23)
CO2: 22 mmol/L (ref 22–32)
Calcium: 8.1 mg/dL — ABNORMAL LOW (ref 8.9–10.3)
Chloride: 105 mmol/L (ref 98–111)
Creatinine, Ser: 2.46 mg/dL — ABNORMAL HIGH (ref 0.44–1.00)
GFR calc Af Amer: 20 mL/min — ABNORMAL LOW (ref >60)
GFR calc non Af Amer: 17 mL/min — ABNORMAL LOW (ref >60)
Glucose, Bld: 108 mg/dL — ABNORMAL HIGH (ref 70–99)
Potassium: 4.8 mmol/L (ref 3.5–5.1)
Sodium: 133 mmol/L — ABNORMAL LOW (ref 135–145)

## 2018-08-25 LAB — CBC WITH DIFFERENTIAL/PLATELET
Abs Immature Granulocytes: 0.05 10*3/uL (ref 0.00–0.07)
Basophils Absolute: 0 10*3/uL (ref 0.0–0.1)
Basophils Relative: 0 %
Eosinophils Absolute: 0.1 10*3/uL (ref 0.0–0.5)
Eosinophils Relative: 1 %
HCT: 25.6 % — ABNORMAL LOW (ref 36.0–46.0)
Hemoglobin: 7.9 g/dL — ABNORMAL LOW (ref 12.0–15.0)
Immature Granulocytes: 1 %
LYMPHS PCT: 7 %
Lymphs Abs: 0.7 10*3/uL (ref 0.7–4.0)
MCH: 28.8 pg (ref 26.0–34.0)
MCHC: 30.9 g/dL (ref 30.0–36.0)
MCV: 93.4 fL (ref 80.0–100.0)
Monocytes Absolute: 0.7 10*3/uL (ref 0.1–1.0)
Monocytes Relative: 7 %
Neutro Abs: 7.8 10*3/uL — ABNORMAL HIGH (ref 1.7–7.7)
Neutrophils Relative %: 84 %
Platelets: 196 10*3/uL (ref 150–400)
RBC: 2.74 MIL/uL — AB (ref 3.87–5.11)
RDW: 14.2 % (ref 11.5–15.5)
WBC: 9.3 10*3/uL (ref 4.0–10.5)
nRBC: 0 % (ref 0.0–0.2)

## 2018-08-25 MED ORDER — SODIUM CHLORIDE 0.9 % IV SOLN
INTRAVENOUS | Status: DC | PRN
  Administered 2018-08-25 (×2): 500 mL via INTRAVENOUS

## 2018-08-25 MED ORDER — DOCUSATE SODIUM 100 MG PO CAPS
100.0000 mg | ORAL_CAPSULE | Freq: Two times a day (BID) | ORAL | Status: DC
  Administered 2018-08-25 – 2018-08-26 (×3): 100 mg via ORAL
  Filled 2018-08-25 (×3): qty 1

## 2018-08-25 MED ORDER — LACTULOSE 10 GM/15ML PO SOLN: 30.0000 g | Freq: Every day | ORAL | Status: DC | PRN

## 2018-08-25 NOTE — Progress Notes (Signed)
[facility] [place] NAME: Savannah Wilson    MR#:  [id number]  DATE OF BIRTH:  1928/08/29  SUBJECTIVE: Seen at bedside, having abdominal pain but better than yesterday, ate good breakfast.  Has constipation, last BM was Tuesday.  No other complaints.  CHIEF COMPLAINT:   Chief Complaint  Patient presents with  . Abdominal Pain     REVIEW OF SYSTEMS:    Review of Systems  Constitutional: Negative for chills, fever and malaise/fatigue.  HENT: Negative for hearing loss and sore throat.   Eyes: Negative for blurred vision, double vision, photophobia and pain.  Respiratory: Negative for cough, hemoptysis, shortness of breath and wheezing.   Cardiovascular: Negative for chest pain, palpitations, orthopnea and leg swelling.  Gastrointestinal: Positive for abdominal pain and constipation. Negative for diarrhea, heartburn, nausea and vomiting.  Genitourinary: Negative for dysuria, hematuria and urgency.  Musculoskeletal: Negative for back pain, joint pain, myalgias and neck pain.  Skin: Negative for rash.  Neurological: Negative for dizziness, sensory change, speech change, focal weakness, seizures, weakness and headaches.  Endo/Heme/Allergies: Does not bruise/bleed easily.  Psychiatric/Behavioral: Negative for depression and memory loss. The patient is not nervous/anxious and does not have insomnia.     DRUG ALLERGIES:   Allergies  Allergen Reactions  . Ace Inhibitors Cough  . Benicar [Olmesartan Medoxomil]     Unsure of reaction type  . Clarithromycin Other (See Comments)    Blisters in mouth  . Clarithromycin Other (See Comments)    Unknown   . Norvasc [Amlodipine Besylate]   . Levofloxacin Rash  . Zithromax [Azithromycin Dihydrate] Rash    VITALS:  Blood pressure (!) 117/52, pulse 81, temperature 97.9 F (36.6 C), temperature source Oral, resp. rate 18, height 5' (1.524 m), weight 60.8 kg, SpO2 98 %.  PHYSICAL EXAMINATION:    Physical Exam  GENERAL:  85 y.o.-year-old patient lying in the bed with no acute distress.  EYES: Pupils equal, round, reactive to light and accommodation. No scleral icterus. Extraocular muscles intact.  HEENT: Head atraumatic, normocephalic. Oropharynx and nasopharynx clear.  NECK:  Supple, no jugular venous distention. No thyroid enlargement, no tenderness.  LUNGS: Normal breath sounds bilaterally, no wheezing, rales, rhonchi. No use of accessory muscles of respiration.  CARDIOVASCULAR: S1, S2 normal. No murmurs, rubs, or gallops.  ABDOMEN: Complains of right lower quadrant, suprapubic tenderness, bowel sounds present, no organomegaly.  Constipated. EXTREMITIES: No cyanosis, clubbing or edema b/l.    NEUROLOGIC: Cranial nerves II through XII are intact. No focal Motor or sensory deficits b/l.   PSYCHIATRIC: The patient is alert and oriented x 3.  SKIN: No obvious rash, lesion, or ulcer.   LABORATORY PANEL:   CBC Recent Labs  Lab 08/25/18 0440  WBC 9.3  HGB 7.9*  HCT 25.6*  PLT 196   ------------------------------------------------------------------------------------------------------------------ Chemistries  Recent Labs  Lab 08/23/18 1254 08/24/18 0438 08/25/18 0440  NA 133* 135 133*  K 4.1 3.5 4.8  CL 103 107 105  CO2 21* 23 22  GLUCOSE 107* 114* 108*  BUN 51* 52* 59*  CREATININE 2.05* 2.07* 2.46*  CALCIUM 8.5* 7.8* 8.1*  MG  --  1.9  --   AST 16  --   --   ALT 10  --   --   ALKPHOS 81  --   --   BILITOT 2.9*  --   --    ------------------------------------------------------------------------------------------------------------------  Cardiac Enzymes Recent Labs  Lab 08/24/18 1058  TROPONINI 0.07*   ------------------------------------------------------------------------------------------------------------------  RADIOLOGY:  Ct Abdomen Pelvis Wo Contrast  Result Date: 08/23/2018 CLINICAL DATA:  Abdominal pain for 2 days.  Vomiting. EXAM: CT ABDOMEN  AND PELVIS WITHOUT CONTRAST TECHNIQUE: Multidetector CT imaging of the abdomen and pelvis was performed following the standard protocol without IV contrast. COMPARISON:  CT abdomen pelvis 06/21/2013 FINDINGS: Lower chest: Heart is enlarged. Moderate sized hiatal hernia. Small bilateral pleural effusions. Subpleural consolidation within the right greater than left lower lobes. Hepatobiliary: The liver is normal in size and contour. Prior cholecystectomy. Pancreas: Unremarkable Spleen: Unremarkable Adrenals/Urinary Tract: Normal adrenal glands. Unchanged 1.1 cm exophytic lesion superior pole right kidney. Interval increase in size of 3.3 cm exophytic lesion interpolar region left kidney (image 33; series 2) with an internal density of 21 Hounsfield units. Small amount of gas in the urinary bladder. Stomach/Bowel: Small volume ascites. Diverticuli involving the small bowel. Descending and sigmoid colonic diverticulosis. No CT evidence for acute diverticulitis. No evidence for small bowel obstruction. Vascular/Lymphatic: Normal caliber abdominal aorta. Peripheral calcified atherosclerotic plaque. No retroperitoneal lymphadenopathy. Reproductive: Status post hysterectomy. Other: None. Musculoskeletal: Lumbar spine degenerative changes. No aggressive or acute appearing osseous lesions. IMPRESSION: 1. Small volume ascites within the abdomen. 2. Small bowel and large bowel diverticulosis without evidence for acute diverticulitis. 3. Small bilateral pleural effusions, right-greater-than-left. 4. Small amount of gas in the urinary bladder, correlate for recent instrumentation. Electronically Signed   By: Lovey Newcomer M.D.   On: 08/23/2018 18:52     ASSESSMENT AND PLAN:   Paroxysmal atrial fibrillation, in the setting of nausea, vomiting.abdominal discomfort. Continue Eliquis.  Echocardiogram showed EF more than 55%.  Patient not on any medicine.  For A. fib now.   *Abdominal pain CT scan abdomen does not show  anything acute other than diverticulosis, small ascites.  Some air in the bladder Likely secondary to constipation, possible mesenteric ischemia due to A. Fib.  Abdominal pain improved, complaint of constipation, patient also has slight mild UTI, continue Zosyn, continue Zosyn for possible intra-abdominal infection.  WBC normal, no fever.  *Hypertension As blood pressure is borderline, I will hold home medications for now and keep monitoring.   *COPD Continue home inhalers and nebulizers for now.  *GERD and Barrett's esophagus Continue PPI twice daily.  *Hypothyroidism Continue levothyroxine and check TSH.  *Chronic renal failure stage IV Appears to be stable at this time.  Continue to monitor. Constipation: Add Colace, lactulose. Deconditioning, physical therapy consult. Discussed with daughter, possible discharge tomorrow.  All the records are reviewed and case discussed with Care Management/Social Workerr. Management plans discussed with the patient, family and they are in agreement.  More than 50% time spent in, counseling, coordination CODE STATUS: DNR   DVT Prophylaxis: SCDs  TOTAL TIME TAKING CARE OF THIS PATIENT: 30 minutes.   POSSIBLE D/C IN 1-2 DAYS, DEPENDING ON CLINICAL CONDITION.  Epifanio Lesches M.D on 08/25/2018 at 10:05 AM  Between 7am to 6pm - Pager - [phone]  After 6pm go to [email or web address] - password [email or web address] [facility]  Office  [phone]  CC: Primary care physician; [name], MD  Note: This dictation was prepared with Dragon dictation along with smaller phrase technology. Any transcriptional errors that result from this process are unintentional.

## 2018-08-25 NOTE — Progress Notes (Signed)
Pt bladder scanned per MD order. 35ml is scanned. I will continue to assess.

## 2018-08-26 MED ORDER — DOCUSATE SODIUM 100 MG PO CAPS: 100.0000 mg | ORAL_CAPSULE | Freq: Two times a day (BID) | ORAL | 0 refills | Status: AC | PRN

## 2018-08-26 MED ORDER — AMOXICILLIN-POT CLAVULANATE 875-125 MG PO TABS: 1.0000 | ORAL_TABLET | Freq: Two times a day (BID) | ORAL | 0 refills | Status: DC

## 2018-08-26 NOTE — Progress Notes (Signed)
No further abdominal pain, tolerating the diet.  Discharge home, daughter can call PCP tomorrow and make an appointment. 1.  Proximal atrial fibrillation because of nausea and abdominal discomfort, continue Eliquis, patient is not on any rate controlling medication. 2.  Abdominal pain improved likely secondary to mesenteric ischemia/constipation: Improved. 3.  Hypertension; can resume home BP medicines with Lasix.  Patient blood pressure is still soft so we can discontinue hydralazine at discharge. Constipation patient can use stool softeners as needed. UTI, patient received IV antibiotics for abdominal pain, nonspecific abdominal infection, will discharge home with Augmentin for 7 days.

## 2018-08-26 NOTE — Evaluation (Signed)
Physical Therapy Evaluation Patient Details Name: Savannah Wilson MRN: [id number] DOB: Jul 05, 1929 Today's Date: 08/26/2018   History of Present Illness  Pt is an 85 year old female presenting to ED 08/23/18 with N/V and abdominal pain. Known history of asthma, Barrett's esophagus, chronic kidney disease stage IV, diverticulosis, gout, hyperlipidemia, hypertension, hypothyroidism, mitral regurgitation, ejection fraction 55 to 60%, pancreatitis, persistent atrial fibrillation status post cardioversion on Eliquis. Prior to hospitalization patient lived at home alone, drove, ambulated without AD, and participated in wellzone through the hospital for regular exercise. Patient also reports she has a small dog that she takes care of. Has a neighbor that checks on her daily, and daughter that lives close by  Clinical Impression  Pt is an 85 year old female presenting to ED for abdominal pain. Current limitations in balance, LE strength, and endurance/activity tolerance. Patient is currently unable to complete prolonged walking, and safe transfers without AD, inhibiting her participation in completing her community errands, ADLs, and taking care of small dog. Patient reports she has a RW at home that she would likie to use for the time being as she feels weaker following hospitalization. Following PT instruction patient is able to demonstrate safe transfers and ambulation over 248ft with RW, with seated rest needed following. Would benefit from skilled PT to address above deficits and promote optimal return to PLOF     Follow Up Recommendations Home health PT    Equipment Recommendations  Rolling walker with 5" wheels    Recommendations for Other Services       Precautions / Restrictions Precautions Precautions: Fall Restrictions Weight Bearing Restrictions: No      Mobility  Bed Mobility Overal bed mobility: Modified Independent             General bed mobility comments: Increased time  needed   Transfers Overall transfer level: Needs assistance Equipment used: Rolling walker (2 wheeled) Transfers: Sit to/from Stand Sit to Stand: Modified independent (Device/Increase time)         General transfer comment: With RW requires cuing for proper, safe transfer, but is able to comply 100% accuracy following initial cuing  Ambulation/Gait Ambulation/Gait assistance: Modified independent (Device/Increase time);Supervision Gait Distance (Feet): 200 Feet Assistive device: Rolling walker (2 wheeled) Gait Pattern/deviations: WFL(Within Functional Limits);Step-through pattern;Decreased step length - left;Decreased step length - right Gait velocity: decreased d/t caution from patient, noted she can "tell she is weaker following hospital stay" Gait velocity interpretation: 1.31 - 2.62 ft/sec, indicative of limited community ambulator General Gait Details: Patient ambulates with small step through patternl; demonstrates safety with RW   Stairs            Wheelchair Mobility    Modified Rankin (Stroke Patients Only)       Balance Overall balance assessment: Needs assistance Sitting-balance support: No upper extremity supported;Feet unsupported Sitting balance-Leahy Scale: Good Sitting balance - Comments: Able to maintain balance with good weight shifting through multidirectional reaching as well   Standing balance support: No upper extremity supported Standing balance-Leahy Scale: Good Standing balance comment: Patient able to stand in semi tandem, narrow BOS, and wide BOS for 10sec without UE support; with eyes closed able to stand normal BOS 10sec, narrow BOS 6sec, unable to complete in semi tandem         Rhomberg - Eyes Opened: 30 Rhomberg - Eyes Closed: 10                 Pertinent Vitals/Pain Pain Assessment: No/denies pain  Home Living Family/patient expects to be discharged to:: Private residence Living Arrangements: Alone Available Help at  Discharge: Family Type of Home: House Home Access: Stairs to enter Entrance Stairs-Rails: Right Entrance Stairs-Number of Steps: 1 step  Home Layout: One level Home Equipment: Brooks - 2 wheels;Cane - quad;Shower seat;Grab bars - tub/shower;Grab bars - toilet      Prior Function Level of Independence: Independent         Comments: (+) driving; does own grocery shopping; pt reports no falls in past 6 months; completes normal exercise regimen in wellzone; cares for small dog; reports she has RW at home but does not use it, but that she will following hospital stay d/t weakness     Hand Dominance        Extremity/Trunk Assessment   Upper Extremity Assessment Upper Extremity Assessment: Overall WFL for tasks assessed    Lower Extremity Assessment Lower Extremity Assessment: Overall WFL for tasks assessed    Cervical / Trunk Assessment Cervical / Trunk Assessment: Kyphotic  Communication   Communication: No difficulties  Cognition Arousal/Alertness: Awake/alert Behavior During Therapy: WFL for tasks assessed/performed                                   General Comments: oriented x4      General Comments      Exercises Other Exercises Other Exercises: PT educated patient on proper transfer with proper hand placement for safety with RW, which patient is able to complete with accuracy following 3x. Patietn able to ambulate over 200 ft and adjust gait to increase tep length with PT cuing; demonstrates good understanding of walker safety. PT led patient through ambulation with head turns (vertical and horizontal) which patient is able to demonstrate with decreased gait speed, and change speeds with cuing of slow/fast with safety as well. PT discussed purpose of PT with patient and daughter, advocating for [facility] services while patient feels as though she is too weak to drive, and ambulate without AD to increase strength and enurance and ensure safety. Pt and daughter  agreeable to plan and verbalize understanding.    Assessment/Plan    PT Assessment Patient needs continued PT services  PT Problem List Decreased strength;Decreased activity tolerance;Decreased balance;Decreased mobility       PT Treatment Interventions DME instruction;Therapeutic exercise;Gait training;Balance training;Manual techniques;Stair training;Neuromuscular re-education;Therapeutic activities;Patient/family education    PT Goals (Current goals can be found in the Care Plan section)  Acute Rehab PT Goals Patient Stated Goal: Return to normal exercise regimen, feel comfortable walking without AD again PT Goal Formulation: With patient Time For Goal Achievement: 09/09/18 Potential to Achieve Goals: Good    Frequency Min 2X/week   Barriers to discharge        Co-evaluation               AM-PAC PT "6 Clicks" Mobility  Outcome Measure Help needed turning from your back to your side while in a flat bed without using bedrails?: A Little Help needed moving from lying on your back to sitting on the side of a flat bed without using bedrails?: A Little Help needed moving to and from a bed to a chair (including a wheelchair)?: A Little Help needed standing up from a chair using your arms (e.g., wheelchair or bedside chair)?: A Little Help needed to walk in hospital room?: A Little Help needed climbing 3-5 steps with a railing? : A Lot  6 Click Score: 17    End of Session Equipment Utilized During Treatment: Gait belt Activity Tolerance: Patient tolerated treatment well Patient left: in bed;with call bell/phone within reach;with bed alarm set;with family/visitor present Nurse Communication: Mobility status PT Visit Diagnosis: Difficulty in walking, not elsewhere classified (R26.2);Muscle weakness (generalized) (M62.81)    Time: 3235-5732 PT Time Calculation (min) (ACUTE ONLY): 33 min   Charges:   PT Evaluation $PT Eval Moderate Complexity: 1 Mod PT  Treatments $Therapeutic Activity: 8-22 mins       Shelton Silvas PT, DPT  Shelton Silvas 08/26/2018, 10:36 AM

## 2018-08-26 NOTE — Plan of Care (Signed)
  Problem: Pain Managment: Goal: General experience of comfort will improve Outcome: Progressing   Problem: Safety: Goal: Ability to remain free from injury will improve Outcome: Progressing   Problem: Activity: Goal: Ability to tolerate increased activity will improve Outcome: Progressing   Problem: Cardiac: Goal: Ability to achieve and maintain adequate cardiopulmonary perfusion will improve Outcome: Progressing

## 2018-08-27 ENCOUNTER — Telehealth: Payer: Self-pay | Admitting: Internal Medicine

## 2018-08-27 ENCOUNTER — Ambulatory Visit (INDEPENDENT_AMBULATORY_CARE_PROVIDER_SITE_OTHER): Payer: PPO

## 2018-08-27 ENCOUNTER — Encounter: Payer: Self-pay | Admitting: Internal Medicine

## 2018-08-27 ENCOUNTER — Ambulatory Visit (INDEPENDENT_AMBULATORY_CARE_PROVIDER_SITE_OTHER): Payer: PPO | Admitting: Internal Medicine

## 2018-08-27 VITALS — BP 136/70 | HR 91 | Temp 98.1°F | Resp 17 | Ht 60.0 in | Wt 144.4 lb

## 2018-08-27 DIAGNOSIS — J189 Pneumonia, unspecified organism: Secondary | ICD-10-CM

## 2018-08-27 DIAGNOSIS — N184 Chronic kidney disease, stage 4 (severe): Secondary | ICD-10-CM | POA: Diagnosis not present

## 2018-08-27 DIAGNOSIS — J90 Pleural effusion, not elsewhere classified: Secondary | ICD-10-CM | POA: Diagnosis not present

## 2018-08-27 DIAGNOSIS — R059 Cough, unspecified: Secondary | ICD-10-CM

## 2018-08-27 DIAGNOSIS — N281 Cyst of kidney, acquired: Secondary | ICD-10-CM

## 2018-08-27 DIAGNOSIS — R0609 Other forms of dyspnea: Secondary | ICD-10-CM

## 2018-08-27 DIAGNOSIS — I4891 Unspecified atrial fibrillation: Secondary | ICD-10-CM

## 2018-08-27 DIAGNOSIS — Z09 Encounter for follow-up examination after completed treatment for conditions other than malignant neoplasm: Secondary | ICD-10-CM

## 2018-08-27 DIAGNOSIS — J181 Lobar pneumonia, unspecified organism: Secondary | ICD-10-CM | POA: Diagnosis not present

## 2018-08-27 DIAGNOSIS — R05 Cough: Secondary | ICD-10-CM | POA: Diagnosis not present

## 2018-08-27 MED ORDER — BENZONATATE 200 MG PO CAPS: 200.0000 mg | ORAL_CAPSULE | Freq: Three times a day (TID) | ORAL | 1 refills | Status: DC | PRN

## 2018-08-27 MED ORDER — AMOXICILLIN-POT CLAVULANATE 875-125 MG PO TABS: 1.0000 | ORAL_TABLET | Freq: Two times a day (BID) | ORAL | 0 refills | Status: DC

## 2018-08-27 MED ORDER — IPRATROPIUM-ALBUTEROL 0.5-2.5 (3) MG/3ML IN SOLN: 3.0000 mL | Freq: Four times a day (QID) | RESPIRATORY_TRACT | 0 refills | Status: AC | PRN

## 2018-08-27 NOTE — Telephone Encounter (Signed)
Transition Care Management Follow-up Telephone Call  How have you been since you were released from the hospital? Patient feeling better, still SOB sit in recliner to sleep.   Do you understand why you were in the hospital? yes   Do you understand the discharge instrcutions? yes  Items Reviewed:  Medications reviewed: yes  Allergies reviewed: yes  Dietary changes reviewed: yes  Referrals reviewed: yes   Functional Questionnaire:   Activities of Daily Living (ADLs):   She states they are independent in the following: ambulation, bathing and hygiene, feeding, continence, grooming, toileting and dressing States they require assistance with the following: continence and No assistance needed at this time.   Any transportation issues/concerns?: no   Any patient concerns? yes   Confirmed importance and date/time of follow-up visits scheduled: yes   Confirmed with patient if condition begins to worsen call PCP or go to the ER.  Patient was given the Call-a-Nurse line [phone]: yes

## 2018-08-27 NOTE — Progress Notes (Signed)
Subjective:  Patient ID: Savannah Wilson, female    DOB: [birth year]  Age: 85 y.o. MRN: [id number]  CC: The primary encounter diagnosis was Cough. Diagnoses of CKD (chronic kidney disease), stage IV ([place]), Pneumonia of both lower lobes due to infectious organism [facility]), Atrial fibrillation with RVR ([place]), Dyspnea on exertion, Hospital discharge follow-up, and Renal cysts, acquired, bilateral were also pertinent to this visit.  HPI Savannah Wilson presents for  hospital follow up.  Patient was admitted on    Jan 16            To    [facility] .  admitting diagnosis was atrial fib with RVR and severe lower abdominal pain of uncertainly etiology  in the setting of  Recent episode of vomiting and diarrhea followed by ileus for 2 days prior to admission. Patient is accompanied by her daughter Delbert Harness today.  She states that 2 days prior to admission she had an episode of nausea vomiting and diarrhea that lasted less than one day.  For the next 2 days she had no bowel movement and no nausea,  But developed severe lower abdominal pain that was intolerable, and so she was taken to the ER .  She was  Noted to be in rapid atrial fib and was started on IV Cardizem gtt for control of heart rate .  Her abominal pain was investigated with a CT scan which noted diverticulosis without inflammation, cardiomegaly,  Bilateral pleural effusions and a subpleural RLL consolidation.  She was transitioned to oral medications and  discharged on  Jan 19 th  To home.  Per the last progress note in chart (no DC summary available at time of hospital follow up), she was prescribed augmentin for 7 days to treat a UTI.   However,  There was no mention of the pneumonia and the family was not notified that she would need to take  augmentin so they have not gone to pharmacy to pick up.  No urine culture was done.  Her blood pressure medications were adjusted:    Hydralazine was stopped  Due to hypotension.     All  labs , imaging  studies and progress notes from admission were reviewed with patient  Kinesha and daughter tami  Today:  ECHO:  EF 27 to 60% with mild LVH,  atrial fib (abnormal myoview Sept 2018 c/w prior inferolateral MI)  CT ABD  Diverticulosis ,  Cardiomegaly,  Moderate HH.  Small effusions bilateral subpleural consolidation RLL .  Slight increase in 3.3 cm exophytic lesion of left kidney   No chest x ray done   Cr 2.46 ON jAN 18TH    UP FROM 2.07 THE DAY PRIOR  .  TAKING LASIX 40 mg daily   Patient denies dysuria and hematuria but has been coughing persistently since discharge.  She has a history of chronic bronchitis and is followed by pulmonology,  last visit Dec 2019.  She reports chest wall tenderness due to coughing,  And Cannot speak in complete sentences at the visit today.  She has been using her home nebulized steroid twice daily , but does not have any Duonebs.Marland Kitchen  HAS resumed taking   40 mg lasix  Daily since discharge yesterday .  Bowels have moved once .   Outpatient Medications Prior to Visit  Medication Sig Dispense Refill  . acetaminophen (TYLENOL) 650 MG CR tablet Take 650 mg by mouth every 8 (eight) hours as needed for pain.     Marland Kitchen  ALPRAZolam (XANAX) 0.5 MG tablet Take 1 tablet (0.5 mg total) by mouth 2 (two) times daily as needed for anxiety. 45 tablet 5  . apixaban (ELIQUIS) 2.5 MG TABS tablet Take 1 tablet (2.5 mg total) by mouth 2 (two) times daily. 180 tablet 3  . budesonide (PULMICORT) 0.25 MG/2ML nebulizer solution USE ONE (1) VIAL IN NEBULIZER TWO TIMES PER DAY 120 mL 0  . cetirizine (ZYRTEC ALLERGY) 10 MG tablet Take 10 mg by mouth daily.    . Cholecalciferol (VITAMIN D-3) 1000 units CAPS Take 1,000 Units by mouth daily.     Marland Kitchen docusate sodium (COLACE) 100 MG capsule Take 1 capsule (100 mg total) by mouth 2 (two) times daily as needed for mild constipation. 10 capsule 0  . escitalopram (LEXAPRO) 10 MG tablet Take 1 tablet (10 mg total) by mouth daily. After dinner 90 tablet 1  .  fluticasone (FLONASE) 50 MCG/ACT nasal spray Place 2 sprays into the nose daily. (Patient taking differently: Place 2 sprays into the nose daily as needed (For allergies.). ) 16 g 6  . furosemide (LASIX) 40 MG tablet Take 40 mg by mouth daily.    Marland Kitchen levothyroxine (SYNTHROID, LEVOTHROID) 100 MCG tablet TAKE ONE TABLET BY MOUTH EVERY DAY FOR THYROID 90 tablet 0  . losartan (COZAAR) 50 MG tablet TAKE ONE TABLET EVERY DAY 90 tablet 0  . mupirocin ointment (BACTROBAN) 2 % Place 1 application into the nose 2 (two) times daily. 22 g 0  . omeprazole (PRILOSEC) 40 MG capsule TAKE ONE CAPSULE BY MOUTH TWICE A DAY. BEFORE BREAKFAST AND SUPPER 180 capsule 1  . PROAIR HFA 108 (90 BASE) MCG/ACT inhaler INHALE 2 PUFFS EVERY 6 HOURS AS NEEDED FOR WHEEZING 8.5 g 3  . rOPINIRole (REQUIP) 0.25 MG tablet TAKE ONE TABLET 3 TIMES DAILY 90 tablet 0  . Spacer/Aero-Holding Chambers (OPTICHAMBER ADVANTAGE) MISC 1 each by Other route once. Always uses her when you're using a metered-dose inhaler. You've aromatase medicine as much, he won't have his much side effect, but you it twice as much medicine and your lungs. 1 each 0  . temazepam (RESTORIL) 15 MG capsule TAKE 1 CAPSULE BY MOUTH AT BEDTIME AS NEEDED FOR SLEEP 30 capsule 5  . vitamin B-12 (CYANOCOBALAMIN) 1000 MCG tablet Take 1,000 mcg by mouth daily.    Marland Kitchen ipratropium-albuterol (DUONEB) 0.5-2.5 (3) MG/3ML SOLN Take 3 mLs by nebulization every 6 (six) hours as needed. 360 mL 0  . amoxicillin-clavulanate (AUGMENTIN) 875-125 MG tablet Take 1 tablet by mouth 2 (two) times daily for 7 days. (Patient not taking: Reported on 08/27/2018) 14 tablet 0  . benzonatate (TESSALON) 200 MG capsule Take 1 capsule (200 mg total) by mouth 3 (three) times daily as needed for cough. (Patient not taking: Reported on 08/23/2018) 60 capsule 1   No facility-administered medications prior to visit.     Review of Systems;  Patient denies headache, fevers, malaise,, skin rash, eye pain, sinus  congestion and sinus pain, sore throat, dysphagia,  hemoptysis , orthopnea, edema, abdominal pain, nausea, melena, diarrhea, constipation, flank pain, dysuria, hematuria, urinary  Frequency, nocturia, numbness, tingling, seizures,  Focal weakness, Loss of consciousness,  Tremor, insomnia, depression,  and suicidal ideation.      Objective:  BP 136/70 (BP Location: Left Arm, Patient Position: Sitting, Cuff Size: Normal)   Pulse 91   Temp 98.1 F (36.7 C) (Oral)   Resp 17   Ht 5' (1.524 m)   Wt 144 lb 6.4 oz (65.5 kg)  SpO2 98%   BMI 28.20 kg/m   BP Readings from Last 3 Encounters:  08/27/18 136/70  08/26/18 131/78  07/10/18 112/62    Wt Readings from Last 3 Encounters:  08/27/18 144 lb 6.4 oz (65.5 kg)  08/23/18 134 lb (60.8 kg)  07/11/18 142 lb (64.4 kg)    General appearance: alert, cooperative and appears anxious and short of breath Ears: normal TM's and external ear canals both ears Throat: lips, mucosa, and tongue normal; teeth and gums normal Neck: no adenopathy, no carotid bruit, supple, symmetrical, trachea midline and thyroid not enlarged, symmetric, no tenderness/mass/nodules Back: symmetric, no curvature. ROM normal. No CVA tenderness. Lungs: clear to auscultation bilaterally Heart: regular rate and rhythm, S1, S2 normal, no murmur, click, rub or gallop Abdomen: soft, non-tender; bowel sounds normal; no masses,  no organomegaly Pulses: 2+ and symmetric Skin: Skin color, texture, turgor normal. No rashes or lesions Lymph nodes: Cervical, supraclavicular, and axillary nodes normal.  Lab Results  Component Value Date   HGBA1C 5.4 08/24/2018   HGBA1C 5.0 06/16/2016    Lab Results  Component Value Date   CREATININE 2.46 (H) 08/25/2018   CREATININE 2.07 (H) 08/24/2018   CREATININE 2.05 (H) 08/23/2018    Lab Results  Component Value Date   WBC 9.3 08/25/2018   HGB 7.9 (L) 08/25/2018   HCT 25.6 (L) 08/25/2018   PLT 196 08/25/2018   GLUCOSE 108 (H)  08/25/2018   CHOL 84 08/24/2018   TRIG 60 08/24/2018   HDL 27 (L) 08/24/2018   LDLCALC 45 08/24/2018   ALT 10 08/23/2018   AST 16 08/23/2018   NA 133 (L) 08/25/2018   K 4.8 08/25/2018   CL 105 08/25/2018   CREATININE 2.46 (H) 08/25/2018   BUN 59 (H) 08/25/2018   CO2 22 08/25/2018   TSH 0.731 08/23/2018   INR 1.48 04/18/2017   HGBA1C 5.4 08/24/2018    No results found.  Assessment & Plan:   Problem List Items Addressed This Visit    Atrial fibrillation with RVR ([place])    She is now rate controlled but still in atril fib and likely to recur given untreated pneumonia. .  Advised to monitor pulse rate with home pulse ox and if > 100 will start oral cardizem q 6 hours      CKD (chronic kidney disease), stage IV ([facility])    Slight bump in cr on day of discharge noted. She will follow up with nephrology asap. Avoiding excessive draws in office given the difficulty they had accessing her veins during recent hospitalization      Dyspnea on exertion    Multifactorial but mostly deconditioning and chronic bronchitis given normal EF by recent ECHO.  Will resume [facility] once her acute illness has resolved.       Hospital discharge follow-up    Patient is stable post discharge but has unresolved issues,  All questions about discharge plans wwre addressed at the visit today for hospital follow up. All labs , imaging studies and progress notes from admission were reviewed with patient today        Pneumonia    Infiltrates are again noted on chest x ray (seen on CT but not addressed ). Patient advised to start Augmentin and take bid for 7 days .  Duonebs and tessalon perles added for symptomatic relief.      Relevant Medications   ipratropium-albuterol (DUONEB) 0.5-2.5 (3) MG/3ML SOLN   benzonatate (TESSALON) 200 MG capsule   amoxicillin-clavulanate (AUGMENTIN) 875-125  MG tablet   Renal cysts, acquired, bilateral    The previously noted cyst off of left kidney has enlarged again and is  now > 3 cm by recent CT.  Dr Eduard Clos has been following this.        Other Visit Diagnoses    Cough    -  Primary   Relevant Orders   DG Chest 2 View (Completed)      I have discontinued Karlene Southard. Nobbe's amoxicillin-clavulanate. I am also having her start on amoxicillin-clavulanate. Additionally, I am having her maintain her acetaminophen, fluticasone, PROAIR HFA, OPTICHAMBER ADVANTAGE, Vitamin D-3, vitamin B-12, furosemide, escitalopram, ALPRAZolam, apixaban, omeprazole, cetirizine, mupirocin ointment, levothyroxine, temazepam, rOPINIRole, losartan, budesonide, docusate sodium, ipratropium-albuterol, and benzonatate.  Meds ordered this encounter  Medications  . ipratropium-albuterol (DUONEB) 0.5-2.5 (3) MG/3ML SOLN    Sig: Take 3 mLs by nebulization every 6 (six) hours as needed.    Dispense:  360 mL    Refill:  0  . benzonatate (TESSALON) 200 MG capsule    Sig: Take 1 capsule (200 mg total) by mouth 3 (three) times daily as needed for cough.    Dispense:  60 capsule    Refill:  1  . amoxicillin-clavulanate (AUGMENTIN) 875-125 MG tablet    Sig: Take 1 tablet by mouth 2 (two) times daily.    Dispense:  14 tablet    Refill:  0    DISREGARD IF THIS IS A DUPLICATE OF ONE SENT BY HOSPITAL YESTERDAY    Medications Discontinued During This Encounter  Medication Reason  . amoxicillin-clavulanate (AUGMENTIN) 875-125 MG tablet Completed Course  . benzonatate (TESSALON) 200 MG capsule Completed Course  . ipratropium-albuterol (DUONEB) 0.5-2.5 (3) MG/3ML SOLN Reorder    Follow-up: Return in about 4 weeks (around 09/24/2018).   [name], MD

## 2018-08-27 NOTE — Patient Instructions (Addendum)
Do not take Colace unless you go a day without a BM  Use the tessalon perles for daytime cough and the Tussionex for nighttime cough   Start the Augmentin (ANTIBIOTIC) TONIGHT AND TAKE twice daily WITH FOOD for 7 days    STAY ON YOUR PROBIOTIC  Use the albuterol/ipatropium nebulized AS NEEDED for chest tightness,  It will make you jittery

## 2018-08-28 ENCOUNTER — Encounter: Payer: Self-pay | Admitting: Internal Medicine

## 2018-08-28 NOTE — Assessment & Plan Note (Signed)
Multifactorial but mostly deconditioning and chronic bronchitis given normal EF by recent ECHO.  Will resume [facility] once her acute illness has resolved.

## 2018-08-28 NOTE — Assessment & Plan Note (Signed)
Slight bump in cr on day of discharge noted. She will follow up with nephrology asap. Avoiding excessive draws in office given the difficulty they had accessing her veins during recent hospitalization

## 2018-08-28 NOTE — Assessment & Plan Note (Signed)
The previously noted cyst off of left kidney has enlarged again and is now > 3 cm by recent CT.  Dr Eduard Clos has been following this.

## 2018-08-28 NOTE — Assessment & Plan Note (Signed)
She is now rate controlled but still in atril fib and likely to recur given untreated pneumonia. .  Advised to monitor pulse rate with home pulse ox and if > 100 will start oral cardizem q 6 hours

## 2018-08-28 NOTE — Assessment & Plan Note (Signed)
Patient is stable post discharge but has unresolved issues,  All questions about discharge plans wwre addressed at the visit today for hospital follow up. All labs , imaging studies and progress notes from admission were reviewed with patient today

## 2018-08-28 NOTE — Assessment & Plan Note (Signed)
Infiltrates are again noted on chest x ray (seen on CT but not addressed ). Patient advised to start Augmentin and take bid for 7 days .  Duonebs and tessalon perles added for symptomatic relief.

## 2018-08-30 NOTE — Discharge Summary (Signed)
Savannah Wilson, is a 85 y.o. female  DOB 1928-10-24  MRN [id number].  Admission date:  08/23/2018  Admitting Physician  Vaughan Basta, MD  Discharge Date:  08/26/2018   Primary MD  [name], MD  Recommendations for primary care physician for things to follow:   Follow-up with PCP in 1 week  Admission Diagnosis  Diverticulosis [K57.90] Generalized abdominal pain [R10.84] Paroxysmal atrial fibrillation with rapid ventricular response (HCC) [I48.0]   Discharge Diagnosis  Diverticulosis [K57.90] Generalized abdominal pain [R10.84] Paroxysmal atrial fibrillation with rapid ventricular response (HCC) [I48.0]    Active Problems:   Abdominal pain   Atrial fibrillation with RVR (HCC)      Past Medical History:  Diagnosis Date  . Asthma   . Barrett esophagus   . Chronic bronchitis   . Chronic kidney disease (CKD), stage IV (severe) ([place])   . Depression   . Diverticulosis   . GERD (gastroesophageal reflux disease)   . Gout   . Hyperlipidemia   . Hypertension   . Hypothyroid   . Mitral regurgitation    a. TTE 02/2017: EF 55-60%, normal WM, calcified mitral annulus with mod regurg, moderately dilated LA, unable to estimate PASP  . Pancreatitis due to biliary obstruction March 2010   s/p ERCP sphincterotomy, cholecystectomy  . Peripheral neuralgia   . Persistent atrial fibrillation    a. diagnosed 01/24/17; CHADS2VASc => 5 (HTN, age x 2, vascular disease with PAD and aortic plaque, female) giving her an estimated annual stroke risk of 6.7%; b. successful DCCV 03/28/2017; c. on Eliquis  . Venous insufficiency     Past Surgical History:  Procedure Laterality Date  . ABDOMINAL HYSTERECTOMY    . CARDIOVERSION N/A 03/28/2017   Procedure: CARDIOVERSION;  Surgeon: Nelva Bush, MD;  Location: [facility];   Service: Cardiovascular;  Laterality: N/A;  . CARPAL TUNNEL RELEASE  jan 2013   [name]  . CATARACT EXTRACTION     right  . CATARACT EXTRACTION  2008  . CHOLECYSTECTOMY  02/10  . CHOLECYSTECTOMY  2010  . TONSILLECTOMY    . VENTRAL HERNIA REPAIR    . VENTRAL HERNIA REPAIR  2007  . VESICOVAGINAL FISTULA CLOSURE W/ TAH         History of present illness and  Hospital Course:     Kindly see H&P for history of present illness and admission details, please review complete Labs, Consult reports and Test reports for all details in brief  HPI  from the history and physical done on the day of admission    Hospital Course   Proximal atrial fibrillation in the setting of nausea, abdominal discomfort, improved, seen by [facility] cardiology, ejection fraction is 55%, patient is not on any rate controlling medication, rate well controlled 70 to 80 bpm. 2.  Abdominal pain, CT abdomen does not show any acute process.  Thought to be secondary to constipation, possible mesenteric ischemia from A. fib.  Patient received stool softeners and also empiric antibiotics to cover for possible more strep infections, abdominal pain improved.  Patient discharged home in stable condition.  Discharge home with Augmentin for 7 days. 3.  Essential hypertension, patient blood pressure borderline during hospital stay. Improved, can continue losartan, Lasix at discharge.  4.  GERD: Patient is on PPIs. 5.  History of COPD patient can continue home inhalers, nebs but did not have any wheezing in the hospital. 5.  Hypothyroidism: Continue levothyroxine. CKD stage IV, stable.  Slightly  elevated creatinine  today but patient can follow-up with nephrology as an outpatient.    Discharge Condition: Stable  Follow UP  Follow-up Information    [name], MD. Schedule an appointment as soon as possible for a visit.   Specialty:  Internal Medicine Why:  Daughter can call PCP tomorrow and make the  appointment. Contact information: [place] [address] [phone]             Discharge Instructions  and  Discharge Medications      Allergies as of 08/26/2018      Reactions   Ace Inhibitors Cough   Benicar [olmesartan Medoxomil]    Unsure of reaction type   Clarithromycin Other (See Comments)   Blisters in mouth   Clarithromycin Other (See Comments)   Unknown    Norvasc [amlodipine Besylate]    Levofloxacin Rash   Zithromax [azithromycin Dihydrate] Rash      Medication List    STOP taking these medications   amoxicillin-clavulanate 875-125 MG tablet Commonly known as:  AUGMENTIN   chlorpheniramine-HYDROcodone 10-8 MG/5ML Suer Commonly known as:  TUSSIONEX PENNKINETIC ER   hydrALAZINE 50 MG tablet Commonly known as:  APRESOLINE   predniSONE 10 MG tablet Commonly known as:  DELTASONE     TAKE these medications   acetaminophen 650 MG CR tablet Commonly known as:  TYLENOL Take 650 mg by mouth every 8 (eight) hours as needed for pain.   ALPRAZolam 0.5 MG tablet Commonly known as:  XANAX Take 1 tablet (0.5 mg total) by mouth 2 (two) times daily as needed for anxiety.   apixaban 2.5 MG Tabs tablet Commonly known as:  ELIQUIS Take 1 tablet (2.5 mg total) by mouth 2 (two) times daily.   budesonide 0.25 MG/2ML nebulizer solution Commonly known as:  PULMICORT USE ONE (1) VIAL IN NEBULIZER TWO TIMES PER DAY   docusate sodium 100 MG capsule Commonly known as:  COLACE Take 1 capsule (100 mg total) by mouth 2 (two) times daily as needed for mild constipation.   escitalopram 10 MG tablet Commonly known as:  LEXAPRO Take 1 tablet (10 mg total) by mouth daily. After dinner   fluticasone 50 MCG/ACT nasal spray Commonly known as:  FLONASE Place 2 sprays into the nose daily. What changed:    when to take this  reasons to take this   furosemide 40 MG tablet Commonly known as:  LASIX Take 40 mg by mouth daily.    levothyroxine 100 MCG tablet Commonly known as:  SYNTHROID, LEVOTHROID TAKE ONE TABLET BY MOUTH EVERY DAY FOR THYROID   losartan 50 MG tablet Commonly known as:  COZAAR TAKE ONE TABLET EVERY DAY   mupirocin ointment 2 % Commonly known as:  BACTROBAN Place 1 application into the nose 2 (two) times daily.   omeprazole 40 MG capsule Commonly known as:  PRILOSEC TAKE ONE CAPSULE BY MOUTH TWICE A DAY. BEFORE BREAKFAST AND SUPPER   OPTICHAMBER ADVANTAGE Misc 1 each by Other route once. Always uses her when you're using a metered-dose inhaler. You've aromatase medicine as much, he won't have his much side effect, but you it twice as much medicine and your lungs.   PROAIR HFA 108 (90 Base) MCG/ACT inhaler Generic drug:  albuterol INHALE 2 PUFFS EVERY 6 HOURS AS NEEDED FOR WHEEZING   rOPINIRole 0.25 MG tablet Commonly known as:  REQUIP TAKE ONE TABLET 3 TIMES DAILY   temazepam 15 MG capsule Commonly known as:  RESTORIL TAKE 1 CAPSULE  BY MOUTH AT BEDTIME AS NEEDED FOR SLEEP   vitamin B-12 1000 MCG tablet Commonly known as:  CYANOCOBALAMIN Take 1,000 mcg by mouth daily.   Vitamin D-3 25 MCG (1000 UT) Caps Take 1,000 Units by mouth daily.   ZYRTEC ALLERGY 10 MG tablet Generic drug:  cetirizine Take 10 mg by mouth daily.         Diet and Activity recommendation: See Discharge Instructions above   Consults obtained -cardiology, physical therapy  Major procedures and Radiology Reports - PLEASE review detailed and final reports for all details, in brief -     Ct Abdomen Pelvis Wo Contrast  Result Date: 08/23/2018 CLINICAL DATA:  Abdominal pain for 2 days.  Vomiting. EXAM: CT ABDOMEN AND PELVIS WITHOUT CONTRAST TECHNIQUE: Multidetector CT imaging of the abdomen and pelvis was performed following the standard protocol without IV contrast. COMPARISON:  CT abdomen pelvis 06/21/2013 FINDINGS: Lower chest: Heart is enlarged. Moderate sized hiatal hernia. Small bilateral pleural  effusions. Subpleural consolidation within the right greater than left lower lobes. Hepatobiliary: The liver is normal in size and contour. Prior cholecystectomy. Pancreas: Unremarkable Spleen: Unremarkable Adrenals/Urinary Tract: Normal adrenal glands. Unchanged 1.1 cm exophytic lesion superior pole right kidney. Interval increase in size of 3.3 cm exophytic lesion interpolar region left kidney (image 33; series 2) with an internal density of 21 Hounsfield units. Small amount of gas in the urinary bladder. Stomach/Bowel: Small volume ascites. Diverticuli involving the small bowel. Descending and sigmoid colonic diverticulosis. No CT evidence for acute diverticulitis. No evidence for small bowel obstruction. Vascular/Lymphatic: Normal caliber abdominal aorta. Peripheral calcified atherosclerotic plaque. No retroperitoneal lymphadenopathy. Reproductive: Status post hysterectomy. Other: None. Musculoskeletal: Lumbar spine degenerative changes. No aggressive or acute appearing osseous lesions. IMPRESSION: 1. Small volume ascites within the abdomen. 2. Small bowel and large bowel diverticulosis without evidence for acute diverticulitis. 3. Small bilateral pleural effusions, right-greater-than-left. 4. Small amount of gas in the urinary bladder, correlate for recent instrumentation. Electronically Signed   By: Lovey Newcomer M.D.   On: 08/23/2018 18:52   Dg Chest 2 View  Result Date: 08/27/2018 CLINICAL DATA:  Persistent cough for 3 weeks EXAM: CHEST - 2 VIEW COMPARISON:  Chest radiograph 09/11/2017 FINDINGS: Stable cardiac and mediastinal contours. Aortic atherosclerosis. Small bilateral pleural effusions. Underlying pulmonary consolidation. No pneumothorax. Thoracic spine degenerative changes. IMPRESSION: Small bilateral pleural effusions with underlying opacities which may represent atelectasis or infection. Electronically Signed   By: Lovey Newcomer M.D.   On: 08/27/2018 15:48    Micro Results    No results found  for this or any previous visit (from the past 240 hour(s)).     Today   Subjective:   Aarilyn Dye today has no headache,no chest abdominal pain,no new weakness tingling or numbness, feels much better wants to go home today.  Objective:   Blood pressure 131/78, pulse 87, temperature 97.9 F (36.6 C), temperature source Oral, resp. rate 18, height 5' (1.524 m), weight 60.8 kg, SpO2 98 %.  No intake or output data in the 24 hours ending 08/30/18 1345  Exam Awake Alert, Oriented x 3, No new F.N deficits, Normal affect [place].AT,PERRAL Supple Neck,No JVD, No cervical lymphadenopathy appriciated.  Symmetrical Chest wall movement, Good air movement bilaterally, CTAB RRR,No Gallops,Rubs or new Murmurs, No Parasternal Heave +ve B.Sounds, Abd Soft, Non tender, No organomegaly appriciated, No rebound -guarding or rigidity. No Cyanosis, Clubbing or edema, No new Rash or bruise  Data Review   CBC w Diff:  Lab Results  Component Value Date   WBC 9.3 08/25/2018   HGB 7.9 (L) 08/25/2018   HGB 10.1 (L) 04/04/2017   HCT 25.6 (L) 08/25/2018   HCT 32.0 (L) 04/04/2017   PLT 196 08/25/2018   PLT 230 04/04/2017   LYMPHOPCT 7 08/25/2018   LYMPHOPCT 13.4 08/23/2012   MONOPCT 7 08/25/2018   MONOPCT 7.8 08/23/2012   EOSPCT 1 08/25/2018   EOSPCT 1.3 08/23/2012   BASOPCT 0 08/25/2018   BASOPCT 1.0 08/23/2012    CMP:  Lab Results  Component Value Date   NA 133 (L) 08/25/2018   NA 140 09/27/2017   NA 135 (L) 08/23/2012   K 4.8 08/25/2018   K 4.7 08/23/2012   CL 105 08/25/2018   CL 102 08/23/2012   CO2 22 08/25/2018   CO2 22 08/23/2012   BUN 59 (H) 08/25/2018   BUN 69 (A) 09/27/2017   BUN 22 (H) 08/23/2012   CREATININE 2.46 (H) 08/25/2018   CREATININE 1.52 (H) 08/23/2012   CREATININE 2.69 (H) 03/16/2012   GLU 127 09/27/2017   PROT 6.9 08/23/2018   PROT 6.6 01/26/2017   PROT 7.1 08/23/2012   ALBUMIN 3.4 (L) 08/23/2018   ALBUMIN 4.2 01/26/2017   ALBUMIN 3.4 08/23/2012   BILITOT  2.9 (H) 08/23/2018   BILITOT 0.9 01/26/2017   BILITOT 1.1 (H) 08/23/2012   ALKPHOS 81 08/23/2018   ALKPHOS 158 (H) 08/23/2012   AST 16 08/23/2018   AST 34 08/23/2012   ALT 10 08/23/2018   ALT 13 08/23/2012  .   Total Time in preparing paper work, data evaluation and todays exam - 35 minutes  Epifanio Lesches M.D on 08/26/2018 at 1:45 PM    Note: This dictation was prepared with Dragon dictation along with smaller phrase technology. Any transcriptional errors that result from this process are unintentional.

## 2018-09-04 ENCOUNTER — Ambulatory Visit (INDEPENDENT_AMBULATORY_CARE_PROVIDER_SITE_OTHER): Payer: PPO | Admitting: Family Medicine

## 2018-09-04 ENCOUNTER — Telehealth: Payer: Self-pay | Admitting: Cardiovascular Disease

## 2018-09-04 VITALS — BP 148/62 | HR 95 | Temp 98.2°F | Resp 20 | Ht 60.0 in | Wt 145.0 lb

## 2018-09-04 DIAGNOSIS — R069 Unspecified abnormalities of breathing: Secondary | ICD-10-CM

## 2018-09-04 DIAGNOSIS — R05 Cough: Secondary | ICD-10-CM

## 2018-09-04 DIAGNOSIS — J181 Lobar pneumonia, unspecified organism: Secondary | ICD-10-CM

## 2018-09-04 DIAGNOSIS — R059 Cough, unspecified: Secondary | ICD-10-CM

## 2018-09-04 DIAGNOSIS — J441 Chronic obstructive pulmonary disease with (acute) exacerbation: Secondary | ICD-10-CM

## 2018-09-04 DIAGNOSIS — J189 Pneumonia, unspecified organism: Secondary | ICD-10-CM

## 2018-09-04 MED ORDER — METHYLPREDNISOLONE ACETATE 40 MG/ML IJ SUSP
40.0000 mg | Freq: Once | INTRAMUSCULAR | Status: AC
  Administered 2018-09-04: 40 mg via INTRAMUSCULAR

## 2018-09-04 MED ORDER — IPRATROPIUM BROMIDE 0.02 % IN SOLN
0.5000 mg | Freq: Once | RESPIRATORY_TRACT | Status: AC
  Administered 2018-09-04: 0.5 mg via RESPIRATORY_TRACT

## 2018-09-04 MED ORDER — ALBUTEROL SULFATE (2.5 MG/3ML) 0.083% IN NEBU
2.5000 mg | INHALATION_SOLUTION | Freq: Once | RESPIRATORY_TRACT | Status: AC
  Administered 2018-09-04: 2.5 mg via RESPIRATORY_TRACT

## 2018-09-04 MED ORDER — DOXYCYCLINE HYCLATE 100 MG PO TABS: 100.0000 mg | ORAL_TABLET | Freq: Two times a day (BID) | ORAL | 0 refills | Status: DC

## 2018-09-04 MED ORDER — METHYLPREDNISOLONE 4 MG PO TBPK: ORAL_TABLET | ORAL | 0 refills | Status: DC

## 2018-09-04 NOTE — Telephone Encounter (Signed)
Pt c/o swelling: STAT is pt has developed SOB within 24 hours  1) How much weight have you gained and in what time span? Daughter is not sure  2) If swelling, where is the swelling located? Bilateral legs  3) Are you currently taking a fluid pill? Yes, pt has increased to 2 per day  4) Are you currently SOB? yes  5) Do you have a log of your daily weights (if so, list)?no  6) Have you gained 3 pounds in a day or 5 pounds in a week?  Not sure  7) Have you traveled recently? no

## 2018-09-04 NOTE — Telephone Encounter (Signed)
Spoke with daughter, ok per DPR.  States patient had increased swelling in her legs during the night where patient says they were weeping some. She took furosemide 40 mg yesterday and this morning her legs are better and not weeping.  Her weight today was 142 lb. 08/27/18 at Dr Lupita Dawn office weight was 144lb. Yesterday and over the weekend patient was not urinating well but seems to be doing a little better today. She see nephrologist on Monday. SOB is no worse but no better. Saw Dr Derrel Nip on 08/27/2018, patient being treated for pneumonia and cough. Attempted to find appointment with our office in the next 24-48 hours.  Discussed with Ignacia Bayley, NP who advised patient to call PCP for appointment as she is being treated for pneumonia and weight has not changed much. Daughter was very polite and appreciative.  Went ahead and scheduled appointment for next available in 09/26/18. Routing to Dr Fletcher Anon for review.

## 2018-09-04 NOTE — Progress Notes (Signed)
Subjective:    Patient ID: Savannah Wilson, female    DOB: 09-Sep-1928, 85 y.o.   MRN: [id number]  HPI  Patient presents to clinic due to continued harsh cough and feelings of shortness of breath.  Patient took last dose of a 7-day Augmentin course this morning to treat a pneumonia.  Patient does have a nebulizer at home, has been alternating albuterol and ipratropium nebulizer treatments every 6 hours.  Daughter also states she has been using Best boy with decent effect on calling cough, but there are times where she coughs so harshly patient feels like she cannot catch her breath.  Daughter states they just picked up a Tussionex cough syrup yesterday, patient has not yet use this today. Patient states she is bringing up yellow phlegm at times with cough, other times it feels stuck in her chest/throat.  Patient also has history of CHF, and lower extremity swelling.  Currently on furosemide.  Per daughter and patient legs are no more swollen than usual and she has not had a more than 3 pound weight gain in the past few days.   Patient Active Problem List   Diagnosis Date Noted  . Insomnia due to anxiety and fear 05/03/2018  . Pneumonia 08/14/2017  . Hospital discharge follow-up 05/06/2017  . CHF exacerbation ([place]) 04/15/2017  . Atrial fibrillation with RVR ([place]) 01/25/2017  . Grief at loss of child 01/25/2017  . Atherosclerosis of aorta ([place]) 07/26/2016  . Angioedema 12/06/2015  . Routine general medical examination at a health care facility 07/25/2015  . Bilateral hand numbness 01/29/2015  . Urinary incontinence, mixed 01/29/2015  . Counseling regarding advanced directives and goals of care 01/29/2015  . Medicare annual wellness visit, subsequent 08/09/2014  . Hypothyroidism 10/06/2013  . Hiatal hernia with gastroesophageal reflux 09/24/2013  . Zenker's diverticulum 09/24/2013  . Anxiety state 07/05/2013  . Abdominal pain 06/03/2013  . Need for prophylactic vaccination and  inoculation against influenza 05/07/2013  . Allergic rhinitis 01/11/2013  . Dyspnea on exertion 01/11/2013  . Bradycardia, drug induced 08/26/2012  . Renal cysts, acquired, bilateral 08/26/2012  . Anemia in chronic kidney disease (CKD) 06/24/2012  . Neuropathy 05/22/2012  . History of pancreatitis   . Hypertension 09/15/2011  . Restless legs syndrome (RLS) 09/15/2011  . Asthma   . Depression, major, single episode, moderate ([place])   . Venous insufficiency   . Barrett esophagus   . CKD (chronic kidney disease), stage IV (HCC)    Social History   Tobacco Use  . Smoking status: Never Smoker  . Smokeless tobacco: Never Used  Substance Use Topics  . Alcohol use: No    Comment: rare   Review of Systems  Constitutional: Negative for chills, and fever. +fatigue HENT: Negative for congestion, ear pain, sinus pain and sore throat.   Eyes: Negative.   Respiratory: +cough, chest congestion, shortness of breath and wheezing.   Cardiovascular: Negative for chest pain, palpitations and leg swelling.  Gastrointestinal: Negative for abdominal pain, diarrhea, nausea and vomiting.  Genitourinary: Negative for dysuria, frequency and urgency.  Musculoskeletal: Negative for arthralgias and myalgias.  Skin: Negative for color change, pallor and rash.  Neurological: Negative for syncope, light-headedness and headaches.  Psychiatric/Behavioral: The patient is not nervous/anxious.       Objective:   Physical Exam Vitals signs and nursing note reviewed.  Constitutional:      General: She is not in acute distress.    Appearance: She is not toxic-appearing or diaphoretic.  HENT:  Head: Normocephalic and atraumatic.     Nose: Rhinorrhea present.     Comments: +post nasal drip    Mouth/Throat:     Mouth: Mucous membranes are moist.  Eyes:     General: No scleral icterus.    Extraocular Movements: Extraocular movements intact.     Conjunctiva/sclera: Conjunctivae normal.  Neck:      Musculoskeletal: Neck supple. No neck rigidity.  Cardiovascular:     Rate and Rhythm: Normal rate and regular rhythm.     Heart sounds: Normal heart sounds.  Pulmonary:     Effort: Pulmonary effort is normal. No respiratory distress.     Breath sounds: Wheezing and rhonchi present. No rales.     Comments: Scattered wheezes and rhonchi.  Harsh, wet raspy cough.  Lymphadenopathy:     Cervical: No cervical adenopathy.  Skin:    General: Skin is warm and dry.     Coloration: Skin is not jaundiced or pale.  Neurological:     Mental Status: She is alert and oriented to person, place, and time.  Psychiatric:        Mood and Affect: Mood normal.        Behavior: Behavior normal.    Wt Readings from Last 3 Encounters:  08/27/18 144 lb 6.4 oz (65.5 kg)  08/23/18 134 lb (60.8 kg)  07/11/18 142 lb (64.4 kg)   Today's Vitals   09/04/18 1600  BP: (!) 148/62  Pulse: 95  Resp: 20  Temp: 98.2 F (36.8 C)  SpO2: 93%  Weight: 145 lb (65.8 kg)  Height: 5' (1.524 m)   Body mass index is 28.32 kg/m.  O2 sat after DuoNeb is 95%     Assessment & Plan:   Pneumonia of both lower lobes, cough, COPD exacerbation- patient given DuoNeb in clinic x1 and also IM steroid injection 40 mg x 1.  Patient does feel much better after using DuoNeb in office.  Advised patient that she can mix her ipratropium and albuterol solutions together and use them in her nebulizer at home.  Due to continued thick discolored phlegm and harsh cough, we will treat patient with doxycycline course twice daily for 7 days.  She will also take oral steroid taper.  Advised patient to use Tussionex that was prescribed to her previously to help reduce cough, Tussionex can cause drowsiness so patient advised to not take this prior to driving.  Patient also has Tessalon Perles at home to use as needed as a nondrowsy option for cough suppression.  Administrations This Visit    albuterol (PROVENTIL) (2.5 MG/3ML) 0.083% nebulizer  solution 2.5 mg    Admin Date 09/04/2018 Action Given Dose 2.5 mg Route Nebulization Administered By Neta Ehlers, RMA       ipratropium (ATROVENT) nebulizer solution 0.5 mg    Admin Date 09/04/2018 Action Given Dose 0.5 mg Route Nebulization Administered By Neta Ehlers, RMA       methylPREDNISolone acetate (DEPO-MEDROL) injection 40 mg    Admin Date 09/04/2018 Action Given Dose 40 mg Route Intramuscular Administered By Neta Ehlers, RMA         Patient has planned follow-up with PCP at the end of February 2020, she will keep this appointment as planned.  Advised patient and daughter to return to clinic sooner if current symptoms are not improving as expected over the next 1 to 2 weeks.

## 2018-09-05 NOTE — Telephone Encounter (Signed)
Ok thanks 

## 2018-09-10 ENCOUNTER — Other Ambulatory Visit: Payer: Self-pay | Admitting: Internal Medicine

## 2018-09-11 ENCOUNTER — Emergency Department: Payer: PPO

## 2018-09-11 ENCOUNTER — Encounter: Payer: Self-pay | Admitting: Emergency Medicine

## 2018-09-11 ENCOUNTER — Emergency Department
Admission: EM | Admit: 2018-09-11 | Discharge: 2018-09-11 | Disposition: A | Discharge status: Home / Self Care | Payer: PPO | Source: Home / Self Care | Attending: Emergency Medicine | Admitting: Emergency Medicine

## 2018-09-11 DIAGNOSIS — Y9389 Activity, other specified: Secondary | ICD-10-CM | POA: Insufficient documentation

## 2018-09-11 DIAGNOSIS — I4891 Unspecified atrial fibrillation: Secondary | ICD-10-CM | POA: Diagnosis not present

## 2018-09-11 DIAGNOSIS — S4992XA Unspecified injury of left shoulder and upper arm, initial encounter: Secondary | ICD-10-CM | POA: Diagnosis not present

## 2018-09-11 DIAGNOSIS — Z7901 Long term (current) use of anticoagulants: Secondary | ICD-10-CM | POA: Diagnosis not present

## 2018-09-11 DIAGNOSIS — E039 Hypothyroidism, unspecified: Secondary | ICD-10-CM

## 2018-09-11 DIAGNOSIS — W19XXXA Unspecified fall, initial encounter: Secondary | ICD-10-CM

## 2018-09-11 DIAGNOSIS — S0990XA Unspecified injury of head, initial encounter: Secondary | ICD-10-CM | POA: Diagnosis not present

## 2018-09-11 DIAGNOSIS — F419 Anxiety disorder, unspecified: Secondary | ICD-10-CM | POA: Diagnosis present

## 2018-09-11 DIAGNOSIS — N184 Chronic kidney disease, stage 4 (severe): Secondary | ICD-10-CM

## 2018-09-11 DIAGNOSIS — Z9049 Acquired absence of other specified parts of digestive tract: Secondary | ICD-10-CM

## 2018-09-11 DIAGNOSIS — S0093XA Contusion of unspecified part of head, initial encounter: Secondary | ICD-10-CM | POA: Diagnosis not present

## 2018-09-11 DIAGNOSIS — R609 Edema, unspecified: Secondary | ICD-10-CM | POA: Diagnosis not present

## 2018-09-11 DIAGNOSIS — I10 Essential (primary) hypertension: Secondary | ICD-10-CM | POA: Diagnosis not present

## 2018-09-11 DIAGNOSIS — K219 Gastro-esophageal reflux disease without esophagitis: Secondary | ICD-10-CM | POA: Diagnosis present

## 2018-09-11 DIAGNOSIS — I129 Hypertensive chronic kidney disease with stage 1 through stage 4 chronic kidney disease, or unspecified chronic kidney disease: Secondary | ICD-10-CM | POA: Insufficient documentation

## 2018-09-11 DIAGNOSIS — Z9841 Cataract extraction status, right eye: Secondary | ICD-10-CM | POA: Diagnosis not present

## 2018-09-11 DIAGNOSIS — S299XXA Unspecified injury of thorax, initial encounter: Secondary | ICD-10-CM | POA: Diagnosis not present

## 2018-09-11 DIAGNOSIS — R1111 Vomiting without nausea: Secondary | ICD-10-CM | POA: Diagnosis not present

## 2018-09-11 DIAGNOSIS — I2510 Atherosclerotic heart disease of native coronary artery without angina pectoris: Secondary | ICD-10-CM

## 2018-09-11 DIAGNOSIS — W01198A Fall on same level from slipping, tripping and stumbling with subsequent striking against other object, initial encounter: Secondary | ICD-10-CM

## 2018-09-11 DIAGNOSIS — R197 Diarrhea, unspecified: Secondary | ICD-10-CM | POA: Diagnosis not present

## 2018-09-11 DIAGNOSIS — R55 Syncope and collapse: Secondary | ICD-10-CM

## 2018-09-11 DIAGNOSIS — S0003XA Contusion of scalp, initial encounter: Secondary | ICD-10-CM

## 2018-09-11 DIAGNOSIS — F329 Major depressive disorder, single episode, unspecified: Secondary | ICD-10-CM | POA: Diagnosis present

## 2018-09-11 DIAGNOSIS — G2581 Restless legs syndrome: Secondary | ICD-10-CM | POA: Diagnosis present

## 2018-09-11 DIAGNOSIS — I509 Heart failure, unspecified: Secondary | ICD-10-CM | POA: Diagnosis not present

## 2018-09-11 DIAGNOSIS — K5730 Diverticulosis of large intestine without perforation or abscess without bleeding: Secondary | ICD-10-CM | POA: Diagnosis not present

## 2018-09-11 DIAGNOSIS — R112 Nausea with vomiting, unspecified: Secondary | ICD-10-CM | POA: Diagnosis not present

## 2018-09-11 DIAGNOSIS — S199XXA Unspecified injury of neck, initial encounter: Secondary | ICD-10-CM | POA: Diagnosis not present

## 2018-09-11 DIAGNOSIS — Y998 Other external cause status: Secondary | ICD-10-CM | POA: Insufficient documentation

## 2018-09-11 DIAGNOSIS — I4819 Other persistent atrial fibrillation: Secondary | ICD-10-CM | POA: Diagnosis present

## 2018-09-11 DIAGNOSIS — Z79890 Hormone replacement therapy (postmenopausal): Secondary | ICD-10-CM | POA: Diagnosis not present

## 2018-09-11 DIAGNOSIS — I340 Nonrheumatic mitral (valve) insufficiency: Secondary | ICD-10-CM | POA: Diagnosis present

## 2018-09-11 DIAGNOSIS — M79602 Pain in left arm: Secondary | ICD-10-CM | POA: Diagnosis not present

## 2018-09-11 DIAGNOSIS — Z841 Family history of disorders of kidney and ureter: Secondary | ICD-10-CM | POA: Diagnosis not present

## 2018-09-11 DIAGNOSIS — R52 Pain, unspecified: Secondary | ICD-10-CM | POA: Diagnosis not present

## 2018-09-11 DIAGNOSIS — R110 Nausea: Secondary | ICD-10-CM | POA: Diagnosis present

## 2018-09-11 DIAGNOSIS — D631 Anemia in chronic kidney disease: Secondary | ICD-10-CM | POA: Diagnosis not present

## 2018-09-11 DIAGNOSIS — M79622 Pain in left upper arm: Secondary | ICD-10-CM

## 2018-09-11 DIAGNOSIS — Z9842 Cataract extraction status, left eye: Secondary | ICD-10-CM | POA: Diagnosis not present

## 2018-09-11 DIAGNOSIS — Y929 Unspecified place or not applicable: Secondary | ICD-10-CM | POA: Insufficient documentation

## 2018-09-11 DIAGNOSIS — Z881 Allergy status to other antibiotic agents status: Secondary | ICD-10-CM | POA: Diagnosis not present

## 2018-09-11 DIAGNOSIS — J45909 Unspecified asthma, uncomplicated: Secondary | ICD-10-CM

## 2018-09-11 DIAGNOSIS — Z79899 Other long term (current) drug therapy: Secondary | ICD-10-CM

## 2018-09-11 DIAGNOSIS — Z90710 Acquired absence of both cervix and uterus: Secondary | ICD-10-CM | POA: Diagnosis not present

## 2018-09-11 DIAGNOSIS — M109 Gout, unspecified: Secondary | ICD-10-CM | POA: Diagnosis present

## 2018-09-11 DIAGNOSIS — M79603 Pain in arm, unspecified: Secondary | ICD-10-CM | POA: Diagnosis not present

## 2018-09-11 DIAGNOSIS — K5790 Diverticulosis of intestine, part unspecified, without perforation or abscess without bleeding: Secondary | ICD-10-CM | POA: Diagnosis present

## 2018-09-11 DIAGNOSIS — I130 Hypertensive heart and chronic kidney disease with heart failure and stage 1 through stage 4 chronic kidney disease, or unspecified chronic kidney disease: Secondary | ICD-10-CM | POA: Diagnosis present

## 2018-09-11 DIAGNOSIS — E785 Hyperlipidemia, unspecified: Secondary | ICD-10-CM | POA: Diagnosis present

## 2018-09-11 DIAGNOSIS — Z888 Allergy status to other drugs, medicaments and biological substances status: Secondary | ICD-10-CM | POA: Diagnosis not present

## 2018-09-11 DIAGNOSIS — Z7951 Long term (current) use of inhaled steroids: Secondary | ICD-10-CM | POA: Diagnosis not present

## 2018-09-11 DIAGNOSIS — I5033 Acute on chronic diastolic (congestive) heart failure: Secondary | ICD-10-CM | POA: Diagnosis present

## 2018-09-11 DIAGNOSIS — R0602 Shortness of breath: Secondary | ICD-10-CM | POA: Diagnosis not present

## 2018-09-11 DIAGNOSIS — Z66 Do not resuscitate: Secondary | ICD-10-CM | POA: Diagnosis not present

## 2018-09-11 DIAGNOSIS — R42 Dizziness and giddiness: Secondary | ICD-10-CM | POA: Diagnosis not present

## 2018-09-11 LAB — CBC WITH DIFFERENTIAL/PLATELET
Abs Immature Granulocytes: 0.11 10*3/uL — ABNORMAL HIGH (ref 0.00–0.07)
Basophils Absolute: 0 10*3/uL (ref 0.0–0.1)
Basophils Relative: 0 %
EOS PCT: 1 %
Eosinophils Absolute: 0.1 10*3/uL (ref 0.0–0.5)
HCT: 33.7 % — ABNORMAL LOW (ref 36.0–46.0)
Hemoglobin: 10.4 g/dL — ABNORMAL LOW (ref 12.0–15.0)
Immature Granulocytes: 1 %
Lymphocytes Relative: 11 %
Lymphs Abs: 1.1 10*3/uL (ref 0.7–4.0)
MCH: 27.4 pg (ref 26.0–34.0)
MCHC: 30.9 g/dL (ref 30.0–36.0)
MCV: 88.7 fL (ref 80.0–100.0)
Monocytes Absolute: 0.5 10*3/uL (ref 0.1–1.0)
Monocytes Relative: 5 %
Neutro Abs: 8.2 10*3/uL — ABNORMAL HIGH (ref 1.7–7.7)
Neutrophils Relative %: 82 %
Platelets: 717 10*3/uL — ABNORMAL HIGH (ref 150–400)
RBC: 3.8 MIL/uL — ABNORMAL LOW (ref 3.87–5.11)
RDW: 14.9 % (ref 11.5–15.5)
WBC: 10.1 10*3/uL (ref 4.0–10.5)
nRBC: 0 % (ref 0.0–0.2)

## 2018-09-11 LAB — URINALYSIS, COMPLETE (UACMP) WITH MICROSCOPIC
Bacteria, UA: NONE SEEN
Bilirubin Urine: NEGATIVE
Glucose, UA: NEGATIVE mg/dL
Ketones, ur: NEGATIVE mg/dL
Leukocytes, UA: NEGATIVE
Nitrite: NEGATIVE
Protein, ur: NEGATIVE mg/dL
Specific Gravity, Urine: 1.005 (ref 1.005–1.030)
pH: 5 (ref 5.0–8.0)

## 2018-09-11 LAB — BASIC METABOLIC PANEL
Anion gap: 10 (ref 5–15)
BUN: 61 mg/dL — ABNORMAL HIGH (ref 8–23)
CO2: 25 mmol/L (ref 22–32)
Calcium: 8.7 mg/dL — ABNORMAL LOW (ref 8.9–10.3)
Chloride: 98 mmol/L (ref 98–111)
Creatinine, Ser: 1.91 mg/dL — ABNORMAL HIGH (ref 0.44–1.00)
GFR calc Af Amer: 27 mL/min — ABNORMAL LOW (ref >60)
GFR calc non Af Amer: 23 mL/min — ABNORMAL LOW (ref >60)
Glucose, Bld: 111 mg/dL — ABNORMAL HIGH (ref 70–99)
Potassium: 4.3 mmol/L (ref 3.5–5.1)
Sodium: 133 mmol/L — ABNORMAL LOW (ref 135–145)

## 2018-09-11 LAB — TROPONIN I
TROPONIN I: 0.03 ng/mL — AB (ref <0.03)
Troponin I: 0.03 ng/mL (ref <0.03)

## 2018-09-11 NOTE — ED Triage Notes (Addendum)
Pt arrived via [facility]. Per EMS, pt tried to grab a pot from under stove and felt dizzy causing her to fall and hit her head on the floor. EMS noticed a lump on the back of her head.  Per EMS fall was unwitnessed. EMS: 139/88;m 108 CBG; 107 HR; 98% SpO2 on RA. Upon arrival pt A/Ox4. Pt states "I don't remember anything after the fall". Pt c/o of a headache, L/arm pain and bilateral  Feet swelling. NAD noted.

## 2018-09-11 NOTE — Discharge Instructions (Addendum)
Please seek medical attention for any high fevers, chest pain, shortness of breath, change in behavior, persistent vomiting, bloody stool or any other new or concerning symptoms.  

## 2018-09-11 NOTE — ED Notes (Signed)
Pt ambulatory in room and up to BR without difficulty or distress noted, stand-by assist only; pt denies any c/o; MD notified

## 2018-09-11 NOTE — ED Provider Notes (Signed)
[facility] Emergency Department Provider Note  ____________________________________________   I have reviewed the triage vital signs and the nursing notes.   HISTORY  Chief Complaint Fall   History limited by: Not Limited   HPI Savannah Wilson is a 85 y.o. female who presents to the emergency department today after passing out and suffering a fall.  The patient states that she had been feeling okay earlier in the day.  She does state that she has not eaten or drank a lot the past couple weeks because she has had a decrease in appetite.  Today she was bending down and when she stood went to stand up she started feeling dizzy and feels like she passed out.  She did hit her head and is only complaining of some pain to the back of her head where she has a small hematoma as well as some left upper arm pain.  The patient was not able to get up on her own.  Patient denies feeling any palpitations or chest pain when this occurred.  The patient is on Eliquis and states she did take her dose this morning.   Per medical record review patient has a history of HLD, HTN  Past Medical History:  Diagnosis Date  . Asthma   . Barrett esophagus   . Chronic bronchitis   . Chronic kidney disease (CKD), stage IV (severe) ([place])   . Depression   . Diverticulosis   . GERD (gastroesophageal reflux disease)   . Gout   . Hyperlipidemia   . Hypertension   . Hypothyroid   . Mitral regurgitation    a. TTE 02/2017: EF 55-60%, normal WM, calcified mitral annulus with mod regurg, moderately dilated LA, unable to estimate PASP  . Pancreatitis due to biliary obstruction March 2010   s/p ERCP sphincterotomy, cholecystectomy  . Peripheral neuralgia   . Persistent atrial fibrillation    a. diagnosed 01/24/17; CHADS2VASc => 5 (HTN, age x 2, vascular disease with PAD and aortic plaque, female) giving her an estimated annual stroke risk of 6.7%; b. successful DCCV 03/28/2017; c. on Eliquis  .  Venous insufficiency     Patient Active Problem List   Diagnosis Date Noted  . Insomnia due to anxiety and fear 05/03/2018  . Pneumonia 08/14/2017  . Hospital discharge follow-up 05/06/2017  . CHF exacerbation ([place]) 04/15/2017  . Atrial fibrillation with RVR ([place]) 01/25/2017  . Grief at loss of child 01/25/2017  . Atherosclerosis of aorta ([place]) 07/26/2016  . Angioedema 12/06/2015  . Routine general medical examination at a health care facility 07/25/2015  . Bilateral hand numbness 01/29/2015  . Urinary incontinence, mixed 01/29/2015  . Counseling regarding advanced directives and goals of care 01/29/2015  . Medicare annual wellness visit, subsequent 08/09/2014  . Hypothyroidism 10/06/2013  . Hiatal hernia with gastroesophageal reflux 09/24/2013  . Zenker's diverticulum 09/24/2013  . Anxiety state 07/05/2013  . Abdominal pain 06/03/2013  . Need for prophylactic vaccination and inoculation against influenza 05/07/2013  . Allergic rhinitis 01/11/2013  . Dyspnea on exertion 01/11/2013  . Bradycardia, drug induced 08/26/2012  . Renal cysts, acquired, bilateral 08/26/2012  . Anemia in chronic kidney disease (CKD) 06/24/2012  . Neuropathy 05/22/2012  . History of pancreatitis   . Hypertension 09/15/2011  . Restless legs syndrome (RLS) 09/15/2011  . Asthma   . Depression, major, single episode, moderate ([place])   . Venous insufficiency   . Barrett esophagus   . CKD (chronic kidney disease), stage IV ([place])  Past Surgical History:  Procedure Laterality Date  . ABDOMINAL HYSTERECTOMY    . CARDIOVERSION N/A 03/28/2017   Procedure: CARDIOVERSION;  Surgeon: Nelva Bush, MD;  Location: [facility];  Service: Cardiovascular;  Laterality: N/A;  . CARPAL TUNNEL RELEASE  jan 2013   [name]  . CATARACT EXTRACTION     right  . CATARACT EXTRACTION  2008  . CHOLECYSTECTOMY  02/10  . CHOLECYSTECTOMY  2010  . TONSILLECTOMY    . VENTRAL HERNIA REPAIR    . VENTRAL HERNIA REPAIR  2007   . VESICOVAGINAL FISTULA CLOSURE W/ TAH      Prior to Admission medications   Medication Sig Start Date End Date Taking? Authorizing Provider  acetaminophen (TYLENOL) 650 MG CR tablet Take 650 mg by mouth every 8 (eight) hours as needed for pain.     [provider]  ALPRAZolam Duanne Moron) 0.5 MG tablet Take 1 tablet (0.5 mg total) by mouth 2 (two) times daily as needed for anxiety. 02/07/18   [name], MD  apixaban (ELIQUIS) 2.5 MG TABS tablet Take 1 tablet (2.5 mg total) by mouth 2 (two) times daily. 02/12/18   Wellington Hampshire, MD  benzonatate (TESSALON) 200 MG capsule Take 1 capsule (200 mg total) by mouth 3 (three) times daily as needed for cough. 08/27/18   [name], MD  budesonide (PULMICORT) 0.25 MG/2ML nebulizer solution USE ONE (1) VIAL IN NEBULIZER TWO TIMES PER DAY 08/21/18   [name], MD  cetirizine (ZYRTEC ALLERGY) 10 MG tablet Take 10 mg by mouth daily.    [provider]  Cholecalciferol (VITAMIN D-3) 1000 units CAPS Take 1,000 Units by mouth daily.     [provider]  docusate sodium (COLACE) 100 MG capsule Take 1 capsule (100 mg total) by mouth 2 (two) times daily as needed for mild constipation. 08/26/18   Epifanio Lesches, MD  doxycycline (VIBRA-TABS) 100 MG tablet Take 1 tablet (100 mg total) by mouth 2 (two) times daily. 09/04/18   Jodelle Green, FNP  escitalopram (LEXAPRO) 10 MG tablet Take 1 tablet (10 mg total) by mouth daily. After dinner 01/29/18   [name], MD  fluticasone (FLONASE) 50 MCG/ACT nasal spray Place 2 sprays into the nose daily. Patient taking differently: Place 2 sprays into the nose daily as needed (For allergies.).  01/30/13   [name], MD  furosemide (LASIX) 40 MG tablet Take 40 mg by mouth daily.    [provider]  ipratropium-albuterol (DUONEB) 0.5-2.5 (3) MG/3ML SOLN Take 3 mLs by nebulization every 6 (six) hours as needed. 08/27/18   [name], MD  levothyroxine (SYNTHROID,  LEVOTHROID) 100 MCG tablet TAKE 1 TABLET BY MOUTH DAILY FOR THYROID 09/10/18   [name], MD  losartan (COZAAR) 50 MG tablet TAKE ONE TABLET EVERY DAY 08/09/18   [name], MD  methylPREDNISolone (MEDROL DOSEPAK) 4 MG TBPK tablet Take according to pack instructions 09/04/18   Jodelle Green, FNP  mupirocin ointment (BACTROBAN) 2 % Place 1 application into the nose 2 (two) times daily. 05/01/18   [name], MD  omeprazole (PRILOSEC) 40 MG capsule TAKE ONE CAPSULE BY MOUTH TWICE A DAY. BEFORE BREAKFAST AND SUPPER 03/13/18   [name], MD  PROAIR HFA 108 (90 BASE) MCG/ACT inhaler INHALE 2 PUFFS EVERY 6 HOURS AS NEEDED FOR WHEEZING 03/13/15   [name], MD  rOPINIRole (REQUIP) 0.25 MG tablet TAKE ONE TABLET 3 TIMES DAILY 07/26/18  [name], MD  Spacer/Aero-Holding [facility] [address] ADVANTAGE) MISC 1 each by Other route once. Always uses her when you're using a metered-dose inhaler. You've aromatase medicine as much, he won't have his much side effect, but you it twice as much medicine and your lungs. 06/08/15   Ahmed Prima, MD  temazepam (RESTORIL) 15 MG capsule TAKE 1 CAPSULE BY MOUTH AT BEDTIME AS NEEDED FOR SLEEP 06/13/18   [name], MD  vitamin B-12 (CYANOCOBALAMIN) 1000 MCG tablet Take 1,000 mcg by mouth daily.    [provider]    Allergies Ace inhibitors; Benicar [olmesartan medoxomil]; Clarithromycin; Clarithromycin; Norvasc [amlodipine besylate]; Levofloxacin; and Zithromax [azithromycin dihydrate]  Family History  Problem Relation Age of Onset  . Kidney failure Mother   . Hypertension Mother   . Kidney disease Mother   . Multiple myeloma Daughter   . Cancer Daughter        multiple myeloma  . Kidney disease Daughter   . Heart disease Father   . Rheumatologic disease Father   . Kidney cancer Father     Social History Social History   Tobacco Use  . Smoking status: Never Smoker  . Smokeless tobacco: Never Used  Substance  Use Topics  . Alcohol use: No    Comment: rare  . Drug use: No   Review of Systems Constitutional: No fever/chills Eyes: No visual changes. ENT: No sore throat. Cardiovascular: Denies chest pain. Respiratory: Denies shortness of breath. Gastrointestinal: No abdominal pain.  No nausea, no vomiting.  No diarrhea.   Genitourinary: Negative for dysuria. Musculoskeletal: Positive for left upper arm pain Skin: Negative for rash. Neurological: Positive for headache ____________________________________________   PHYSICAL EXAM:  VITAL SIGNS: ED Triage Vitals  Enc Vitals Group     BP 09/11/18 1712 (!) 176/94     Pulse Rate 09/11/18 1712 98     Resp --      Temp 09/11/18 1712 (!) 97.4 F (36.3 C)     Temp Source 09/11/18 1712 Oral     SpO2 09/11/18 1712 97 %     Weight 09/11/18 1655 141 lb (64 kg)     Height 09/11/18 1655 5' (1.524 m)     Head Circumference --      Peak Flow --      Pain Score 09/11/18 1708 8     Pain Loc --      Pain Edu? --      Excl. in [place]? --     Constitutional: Alert and oriented.  Eyes: Conjunctivae are normal.  ENT      Head: Normocephalic. Hematoma to occiput.       Nose: No congestion/rhinnorhea.      Mouth/Throat: Mucous membranes are moist.      Neck: No stridor. No midline tenderness.  Hematological/Lymphatic/Immunilogical: No cervical lymphadenopathy. Cardiovascular: Normal rate, regular rhythm.  No murmurs, rubs, or gallops.  Respiratory: Normal respiratory effort without tachypnea nor retractions. Breath sounds are clear and equal bilaterally. No wheezes/rales/rhonchi. Gastrointestinal: Soft and non tender. No rebound. No guarding.  Genitourinary: Deferred Musculoskeletal: Normal range of motion in all extremities. No lower extremity edema. Neurologic:  Normal speech and language. No gross focal neurologic deficits are appreciated.  Skin:  Skin is warm, dry and intact. No rash noted. Psychiatric: Mood and affect are normal. Speech and  behavior are normal. Patient exhibits appropriate insight and judgment.  ____________________________________________    LABS (pertinent positives/negatives)  Trop 0.03 x 2 BMP na 133, k 4.3,  glu 111, cr 1.91, ca 8.7 CBC wbc 10.1, hgb 10.4, plt 717 UA clear, moderate hgb dipstick, otherwise wnl  ____________________________________________    RADIOLOGY  CXR Increased bilateral effusion  CT head/cervical spine No acute intracranial findings. No cervical spine fracture  ____________________________________________   PROCEDURES  Procedures  ____________________________________________   INITIAL IMPRESSION / ASSESSMENT AND PLAN / ED COURSE  Pertinent labs & imaging results that were available during my care of the patient were reviewed by me and considered in my medical decision making (see chart for details).   Patient presented to the emergency department today after a fall and possible syncopal episode.  Patient's work-up here without any obvious etiology.  Patient's head CT with no intracranial findings.  Troponin was negative x2.  No signs of infection or concerning electrolyte abnormality.  Patient was walked here in the emergency department and did well.  This point do wonder if patient syncopal episode could be related to the change in the position from bending to standing.  Will plan on discharging.  Will have patient follow-up with primary care.  ____________________________________________   FINAL CLINICAL IMPRESSION(S) / ED DIAGNOSES  Final diagnoses:  Fall, initial encounter  Syncope, unspecified syncope type  Contusion of head, unspecified part of head, initial encounter     Note: This dictation was prepared with Dragon dictation. Any transcriptional errors that result from this process are unintentional     Nance Pear, MD 09/11/18 2333

## 2018-09-11 NOTE — ED Notes (Signed)
Patient transported to X-ray 

## 2018-09-11 NOTE — ED Notes (Signed)
Pt taking sips of sprite; voices good understanding of plan of care

## 2018-09-11 NOTE — ED Notes (Signed)
Patient transported to CT 

## 2018-09-11 NOTE — ED Notes (Signed)
Pt has been notified to provide a urine sample. Pt unable to provide a urine sample. This RN will continue to monitor. Daughter at bedside

## 2018-09-11 NOTE — ED Notes (Signed)
Assisted to ambulate to room commode with stand-by assist; no difficulty or distress noted; pt voided qs; sample to lab as ordered; pt requesting PO fluids; will notify MD

## 2018-09-11 NOTE — ED Notes (Signed)
Patient returned from X-ray 

## 2018-09-11 NOTE — ED Notes (Addendum)
Pt c/o of left arm pain and headache; pt confirms LOC; per pt "I don't remember falling" Upon assestment bruising noted on left arm and a  lump on back of head. No bleeding noted a this time. Pt denies dizziness and SOB. Pt states she is on Eliquis at home. Pt states she has noticed bilateral feet swelling that started 4 days ago. Family members at home.

## 2018-09-11 NOTE — ED Notes (Signed)
ED Provider at bedside. 

## 2018-09-12 ENCOUNTER — Other Ambulatory Visit: Payer: Self-pay

## 2018-09-12 ENCOUNTER — Emergency Department: Payer: PPO

## 2018-09-12 ENCOUNTER — Inpatient Hospital Stay
Admission: EM | Admit: 2018-09-12 | Discharge: 2018-09-15 | DRG: 308 | Disposition: A | Discharge status: Home Health | Payer: PPO | Attending: Specialist | Admitting: Specialist

## 2018-09-12 DIAGNOSIS — Z8249 Family history of ischemic heart disease and other diseases of the circulatory system: Secondary | ICD-10-CM

## 2018-09-12 DIAGNOSIS — Z79890 Hormone replacement therapy (postmenopausal): Secondary | ICD-10-CM

## 2018-09-12 DIAGNOSIS — Z9049 Acquired absence of other specified parts of digestive tract: Secondary | ICD-10-CM

## 2018-09-12 DIAGNOSIS — N184 Chronic kidney disease, stage 4 (severe): Secondary | ICD-10-CM | POA: Diagnosis present

## 2018-09-12 DIAGNOSIS — Z7901 Long term (current) use of anticoagulants: Secondary | ICD-10-CM

## 2018-09-12 DIAGNOSIS — I340 Nonrheumatic mitral (valve) insufficiency: Secondary | ICD-10-CM | POA: Diagnosis present

## 2018-09-12 DIAGNOSIS — K5790 Diverticulosis of intestine, part unspecified, without perforation or abscess without bleeding: Secondary | ICD-10-CM | POA: Diagnosis present

## 2018-09-12 DIAGNOSIS — K529 Noninfective gastroenteritis and colitis, unspecified: Secondary | ICD-10-CM | POA: Diagnosis present

## 2018-09-12 DIAGNOSIS — Z90710 Acquired absence of both cervix and uterus: Secondary | ICD-10-CM

## 2018-09-12 DIAGNOSIS — E785 Hyperlipidemia, unspecified: Secondary | ICD-10-CM | POA: Diagnosis present

## 2018-09-12 DIAGNOSIS — F329 Major depressive disorder, single episode, unspecified: Secondary | ICD-10-CM | POA: Diagnosis present

## 2018-09-12 DIAGNOSIS — M109 Gout, unspecified: Secondary | ICD-10-CM | POA: Diagnosis present

## 2018-09-12 DIAGNOSIS — Z888 Allergy status to other drugs, medicaments and biological substances status: Secondary | ICD-10-CM

## 2018-09-12 DIAGNOSIS — R110 Nausea: Secondary | ICD-10-CM

## 2018-09-12 DIAGNOSIS — Z841 Family history of disorders of kidney and ureter: Secondary | ICD-10-CM

## 2018-09-12 DIAGNOSIS — K219 Gastro-esophageal reflux disease without esophagitis: Secondary | ICD-10-CM | POA: Diagnosis present

## 2018-09-12 DIAGNOSIS — G2581 Restless legs syndrome: Secondary | ICD-10-CM | POA: Diagnosis present

## 2018-09-12 DIAGNOSIS — E039 Hypothyroidism, unspecified: Secondary | ICD-10-CM | POA: Diagnosis present

## 2018-09-12 DIAGNOSIS — Z8051 Family history of malignant neoplasm of kidney: Secondary | ICD-10-CM

## 2018-09-12 DIAGNOSIS — I4891 Unspecified atrial fibrillation: Secondary | ICD-10-CM | POA: Diagnosis present

## 2018-09-12 DIAGNOSIS — I447 Left bundle-branch block, unspecified: Secondary | ICD-10-CM | POA: Diagnosis present

## 2018-09-12 DIAGNOSIS — Z9842 Cataract extraction status, left eye: Secondary | ICD-10-CM

## 2018-09-12 DIAGNOSIS — E876 Hypokalemia: Secondary | ICD-10-CM | POA: Diagnosis not present

## 2018-09-12 DIAGNOSIS — R112 Nausea with vomiting, unspecified: Secondary | ICD-10-CM | POA: Diagnosis present

## 2018-09-12 DIAGNOSIS — I10 Essential (primary) hypertension: Secondary | ICD-10-CM | POA: Diagnosis present

## 2018-09-12 DIAGNOSIS — R197 Diarrhea, unspecified: Secondary | ICD-10-CM

## 2018-09-12 DIAGNOSIS — I130 Hypertensive heart and chronic kidney disease with heart failure and stage 1 through stage 4 chronic kidney disease, or unspecified chronic kidney disease: Secondary | ICD-10-CM | POA: Diagnosis present

## 2018-09-12 DIAGNOSIS — Z881 Allergy status to other antibiotic agents status: Secondary | ICD-10-CM

## 2018-09-12 DIAGNOSIS — Z66 Do not resuscitate: Secondary | ICD-10-CM | POA: Diagnosis not present

## 2018-09-12 DIAGNOSIS — Z807 Family history of other malignant neoplasms of lymphoid, hematopoietic and related tissues: Secondary | ICD-10-CM

## 2018-09-12 DIAGNOSIS — I5033 Acute on chronic diastolic (congestive) heart failure: Secondary | ICD-10-CM | POA: Diagnosis present

## 2018-09-12 DIAGNOSIS — I4819 Other persistent atrial fibrillation: Principal | ICD-10-CM | POA: Diagnosis present

## 2018-09-12 DIAGNOSIS — Z9841 Cataract extraction status, right eye: Secondary | ICD-10-CM

## 2018-09-12 DIAGNOSIS — F419 Anxiety disorder, unspecified: Secondary | ICD-10-CM | POA: Diagnosis present

## 2018-09-12 DIAGNOSIS — D631 Anemia in chronic kidney disease: Secondary | ICD-10-CM | POA: Diagnosis present

## 2018-09-12 DIAGNOSIS — Z7951 Long term (current) use of inhaled steroids: Secondary | ICD-10-CM

## 2018-09-12 LAB — URINALYSIS, COMPLETE (UACMP) WITH MICROSCOPIC
BILIRUBIN URINE: NEGATIVE
Bacteria, UA: NONE SEEN
Glucose, UA: NEGATIVE mg/dL
Ketones, ur: NEGATIVE mg/dL
Leukocytes, UA: NEGATIVE
Nitrite: NEGATIVE
PH: 5 (ref 5.0–8.0)
Protein, ur: NEGATIVE mg/dL
SPECIFIC GRAVITY, URINE: 1.006 (ref 1.005–1.030)

## 2018-09-12 LAB — COMPREHENSIVE METABOLIC PANEL
ALT: 13 U/L (ref 0–44)
AST: 19 U/L (ref 15–41)
Albumin: 3.1 g/dL — ABNORMAL LOW (ref 3.5–5.0)
Alkaline Phosphatase: 70 U/L (ref 38–126)
Anion gap: 10 (ref 5–15)
BUN: 59 mg/dL — ABNORMAL HIGH (ref 8–23)
CALCIUM: 8.3 mg/dL — AB (ref 8.9–10.3)
CO2: 23 mmol/L (ref 22–32)
Chloride: 102 mmol/L (ref 98–111)
Creatinine, Ser: 1.9 mg/dL — ABNORMAL HIGH (ref 0.44–1.00)
GFR calc Af Amer: 27 mL/min — ABNORMAL LOW (ref >60)
GFR calc non Af Amer: 23 mL/min — ABNORMAL LOW (ref >60)
Glucose, Bld: 117 mg/dL — ABNORMAL HIGH (ref 70–99)
Potassium: 3.3 mmol/L — ABNORMAL LOW (ref 3.5–5.1)
Sodium: 135 mmol/L (ref 135–145)
Total Bilirubin: 1.2 mg/dL (ref 0.3–1.2)
Total Protein: 6.5 g/dL (ref 6.5–8.1)

## 2018-09-12 LAB — CBC WITH DIFFERENTIAL/PLATELET
Abs Immature Granulocytes: 0.03 10*3/uL (ref 0.00–0.07)
Basophils Absolute: 0 10*3/uL (ref 0.0–0.1)
Basophils Relative: 0 %
EOS PCT: 0 %
Eosinophils Absolute: 0 10*3/uL (ref 0.0–0.5)
HCT: 31.8 % — ABNORMAL LOW (ref 36.0–46.0)
Hemoglobin: 9.8 g/dL — ABNORMAL LOW (ref 12.0–15.0)
Immature Granulocytes: 0 %
Lymphocytes Relative: 9 %
Lymphs Abs: 0.8 10*3/uL (ref 0.7–4.0)
MCH: 27.2 pg (ref 26.0–34.0)
MCHC: 30.8 g/dL (ref 30.0–36.0)
MCV: 88.3 fL (ref 80.0–100.0)
Monocytes Absolute: 0.5 10*3/uL (ref 0.1–1.0)
Monocytes Relative: 6 %
Neutro Abs: 7.8 10*3/uL — ABNORMAL HIGH (ref 1.7–7.7)
Neutrophils Relative %: 85 %
Platelets: 557 10*3/uL — ABNORMAL HIGH (ref 150–400)
RBC: 3.6 MIL/uL — ABNORMAL LOW (ref 3.87–5.11)
RDW: 14.9 % (ref 11.5–15.5)
WBC: 9.2 10*3/uL (ref 4.0–10.5)
nRBC: 0 % (ref 0.0–0.2)

## 2018-09-12 LAB — LACTIC ACID, PLASMA: LACTIC ACID, VENOUS: 1.3 mmol/L (ref 0.5–1.9)

## 2018-09-12 LAB — TROPONIN I: Troponin I: 0.03 ng/mL (ref <0.03)

## 2018-09-12 LAB — LIPASE, BLOOD: Lipase: 48 U/L (ref 11–51)

## 2018-09-12 MED ORDER — SODIUM CHLORIDE 0.9 % IV SOLN
Freq: Once | INTRAVENOUS | Status: AC
  Administered 2018-09-12: 20:00:00 via INTRAVENOUS

## 2018-09-12 MED ORDER — IOPAMIDOL (ISOVUE-300) INJECTION 61%
30.0000 mL | Freq: Once | INTRAVENOUS | Status: AC
  Administered 2018-09-12: 30 mL via ORAL

## 2018-09-12 MED ORDER — ONDANSETRON 4 MG PO TBDP: 4.0000 mg | ORAL_TABLET | Freq: Three times a day (TID) | ORAL | 0 refills | Status: AC | PRN

## 2018-09-12 MED ORDER — PROMETHAZINE HCL 25 MG/ML IJ SOLN
6.2500 mg | Freq: Once | INTRAMUSCULAR | Status: AC
  Administered 2018-09-12: 6.25 mg via INTRAVENOUS
  Filled 2018-09-12: qty 1

## 2018-09-12 MED ORDER — ALPRAZOLAM 0.5 MG PO TABS
0.5000 mg | ORAL_TABLET | Freq: Once | ORAL | Status: AC
  Administered 2018-09-12: 0.5 mg via ORAL
  Filled 2018-09-12: qty 1

## 2018-09-12 MED ORDER — ONDANSETRON 4 MG PO TBDP
4.0000 mg | ORAL_TABLET | Freq: Once | ORAL | Status: AC
  Administered 2018-09-12: 4 mg via ORAL
  Filled 2018-09-12: qty 1

## 2018-09-12 MED ORDER — PROMETHAZINE HCL 25 MG/ML IJ SOLN: 6.2500 mg | Freq: Once | INTRAMUSCULAR | Status: DC

## 2018-09-12 NOTE — ED Triage Notes (Signed)
Pt in via EMS from home for N/V starting last night. Pt in ED yest for fall; pt hit head; CT neg yest. BP 148/73; HR 112; RR 20; 96% RA; recent PNA. EMS tried once for IV. A&Ox4.

## 2018-09-12 NOTE — ED Notes (Signed)
EDP Malinda notified in person of trop 0.03.

## 2018-09-12 NOTE — ED Notes (Signed)
Pt assisted to/from bed/toilet.

## 2018-09-12 NOTE — ED Notes (Signed)
EKG completed

## 2018-09-12 NOTE — ED Notes (Signed)
Pt called out stating that she needed to urinate. Painful to get up and walk to toilet so placed on bedpan at this time. Urinated into bedpan. Cleaned with wipes, brief placed on pt. Pt readjusted in bed.   CT notified that pt finished oral contrast. Stated that they would have to wait to take pt to CT since pt was supposed to drink 2nd bottle at this time d/t not being able to have IV contrast.

## 2018-09-12 NOTE — ED Notes (Addendum)
EDP Malinda notified of pt inc in HR sustained in 120-130s... unsustained in 150s. Pt denies taking any night time HR/BP meds but states she is anxious and has RLS.

## 2018-09-12 NOTE — ED Notes (Signed)
Pt went to CT

## 2018-09-12 NOTE — ED Notes (Signed)
2nd grn top sent.

## 2018-09-12 NOTE — ED Notes (Signed)
No 2nd trop necessary per EDP Malinda as pt remains at her baseline 0.03.

## 2018-09-12 NOTE — Discharge Instructions (Addendum)
Please follow-up with your primary care doctor to check on your diarrhea.  Please use the Zofran as needed for nausea and vomiting.  Drink sips of clear liquids and first thing in the morning for an hour or 2 after kept those down try the brat diet bananas rice applesauce and toast.  Nibble on that for a few hours and if you keep that down try to regular food.  Please return if you cannot keep down any liquids, if you get a fever, if you feel sick or have any other problems.  Please also have your primary care doc review the CT report from today.  It shows a mass near the kidney which is unchanged from last month but larger than it was in 2014.  Radiology is recommending an MRI of the abdomen to further evaluate this.

## 2018-09-12 NOTE — ED Notes (Signed)
IV attempted x2 by 2nd RN.

## 2018-09-12 NOTE — ED Notes (Signed)
EKG given to [place].

## 2018-09-12 NOTE — ED Notes (Signed)
[facility] ED RN will try ultrasound IV soon if IV team doesn't beat her to bedside.

## 2018-09-12 NOTE — ED Notes (Signed)
Pt/family updated on plan.

## 2018-09-12 NOTE — ED Provider Notes (Addendum)
[facility] Emergency Department Provider Note   ____________________________________________   First MD Initiated Contact with Patient 09/12/18 1757     (approximate)  I have reviewed the triage vital signs and the nursing notes.   HISTORY  Chief Complaint Nausea and Emesis    HPI Savannah Wilson is a 85 y.o. female patient was seen yesterday because she fell and hit her head.  She has begun vomiting a lot now.  She has had diarrhea frequently for several days previous.  She has not vomited prior to the fall yesterday.  She feels weak.  Her stomach is achy but not severe tender.  Complaining of severe nausea.   Past Medical History:  Diagnosis Date  . Asthma   . Barrett esophagus   . Chronic bronchitis   . Chronic kidney disease (CKD), stage IV (severe) ([place])   . Depression   . Diverticulosis   . GERD (gastroesophageal reflux disease)   . Gout   . Hyperlipidemia   . Hypertension   . Hypothyroid   . Mitral regurgitation    a. TTE 02/2017: EF 55-60%, normal WM, calcified mitral annulus with mod regurg, moderately dilated LA, unable to estimate PASP  . Pancreatitis due to biliary obstruction March 2010   s/p ERCP sphincterotomy, cholecystectomy  . Peripheral neuralgia   . Persistent atrial fibrillation    a. diagnosed 01/24/17; CHADS2VASc => 5 (HTN, age x 2, vascular disease with PAD and aortic plaque, female) giving her an estimated annual stroke risk of 6.7%; b. successful DCCV 03/28/2017; c. on Eliquis  . Venous insufficiency     Patient Active Problem List   Diagnosis Date Noted  . Insomnia due to anxiety and fear 05/03/2018  . Pneumonia 08/14/2017  . Hospital discharge follow-up 05/06/2017  . CHF exacerbation ([place]) 04/15/2017  . Atrial fibrillation with RVR ([place]) 01/25/2017  . Grief at loss of child 01/25/2017  . Atherosclerosis of aorta ([place]) 07/26/2016  . Angioedema 12/06/2015  . Routine general medical examination at a health care  facility 07/25/2015  . Bilateral hand numbness 01/29/2015  . Urinary incontinence, mixed 01/29/2015  . Counseling regarding advanced directives and goals of care 01/29/2015  . Medicare annual wellness visit, subsequent 08/09/2014  . Hypothyroidism 10/06/2013  . Hiatal hernia with gastroesophageal reflux 09/24/2013  . Zenker's diverticulum 09/24/2013  . Anxiety state 07/05/2013  . Abdominal pain 06/03/2013  . Need for prophylactic vaccination and inoculation against influenza 05/07/2013  . Allergic rhinitis 01/11/2013  . Dyspnea on exertion 01/11/2013  . Bradycardia, drug induced 08/26/2012  . Renal cysts, acquired, bilateral 08/26/2012  . Anemia in chronic kidney disease (CKD) 06/24/2012  . Neuropathy 05/22/2012  . History of pancreatitis   . Hypertension 09/15/2011  . Restless legs syndrome (RLS) 09/15/2011  . Asthma   . Depression, major, single episode, moderate ([place])   . Venous insufficiency   . Barrett esophagus   . CKD (chronic kidney disease), stage IV [facility])     Past Surgical History:  Procedure Laterality Date  . ABDOMINAL HYSTERECTOMY    . CARDIOVERSION N/A 03/28/2017   Procedure: CARDIOVERSION;  Surgeon: Nelva Bush, MD;  Location: [facility];  Service: Cardiovascular;  Laterality: N/A;  . CARPAL TUNNEL RELEASE  jan 2013   [name]  . CATARACT EXTRACTION     right  . CATARACT EXTRACTION  2008  . CHOLECYSTECTOMY  02/10  . CHOLECYSTECTOMY  2010  . TONSILLECTOMY    . VENTRAL HERNIA REPAIR    . VENTRAL  HERNIA REPAIR  2007  . VESICOVAGINAL FISTULA CLOSURE W/ TAH      Prior to Admission medications   Medication Sig Start Date End Date Taking? Authorizing Provider  ALPRAZolam Duanne Moron) 0.5 MG tablet Take 1 tablet (0.5 mg total) by mouth 2 (two) times daily as needed for anxiety. 02/07/18  Yes [name], MD  apixaban (ELIQUIS) 2.5 MG TABS tablet Take 1 tablet (2.5 mg total) by mouth 2 (two) times daily. 02/12/18  Yes Wellington Hampshire, MD  budesonide  (PULMICORT) 0.25 MG/2ML nebulizer solution USE ONE (1) VIAL IN NEBULIZER TWO TIMES PER DAY 08/21/18  Yes [name], MD  cetirizine (ZYRTEC ALLERGY) 10 MG tablet Take 10 mg by mouth daily.   Yes [provider]  Cholecalciferol (VITAMIN D-3) 1000 units CAPS Take 1,000 Units by mouth daily.    Yes [provider]  doxycycline (VIBRA-TABS) 100 MG tablet Take 1 tablet (100 mg total) by mouth 2 (two) times daily. 09/04/18  Yes Guse, Jacquelynn Cree, FNP  escitalopram (LEXAPRO) 10 MG tablet Take 1 tablet (10 mg total) by mouth daily. After dinner 01/29/18  Yes [name], MD  furosemide (LASIX) 40 MG tablet Take 80 mg by mouth daily.    Yes [provider]  ipratropium-albuterol (DUONEB) 0.5-2.5 (3) MG/3ML SOLN Take 3 mLs by nebulization every 6 (six) hours as needed. 08/27/18  Yes [name], MD  levothyroxine (SYNTHROID, LEVOTHROID) 100 MCG tablet TAKE 1 TABLET BY MOUTH DAILY FOR THYROID 09/10/18  Yes [name], MD  losartan (COZAAR) 50 MG tablet TAKE ONE TABLET EVERY DAY 08/09/18  Yes [name], MD  omeprazole (PRILOSEC) 40 MG capsule TAKE ONE CAPSULE BY MOUTH TWICE A DAY. BEFORE BREAKFAST AND SUPPER 03/13/18  Yes [name], MD  rOPINIRole (REQUIP) 0.25 MG tablet TAKE ONE TABLET 3 TIMES DAILY 07/26/18  Yes [name], MD  vitamin B-12 (CYANOCOBALAMIN) 1000 MCG tablet Take 1,000 mcg by mouth daily.   Yes [provider]  acetaminophen (TYLENOL) 650 MG CR tablet Take 650 mg by mouth every 8 (eight) hours as needed for pain.     [provider]  benzonatate (TESSALON) 200 MG capsule Take 1 capsule (200 mg total) by mouth 3 (three) times daily as needed for cough. 08/27/18   [name], MD  docusate sodium (COLACE) 100 MG capsule Take 1 capsule (100 mg total) by mouth 2 (two) times daily as needed for mild constipation. 08/26/18   Epifanio Lesches, MD  fluticasone (FLONASE) 50 MCG/ACT nasal spray Place 2 sprays into the nose  daily. Patient taking differently: Place 2 sprays into the nose daily as needed (For allergies.).  01/30/13   [name], MD  methylPREDNISolone (MEDROL DOSEPAK) 4 MG TBPK tablet Take according to pack instructions 09/04/18   Jodelle Green, FNP  mupirocin ointment (BACTROBAN) 2 % Place 1 application into the nose 2 (two) times daily. 05/01/18   [name], MD  ondansetron (ZOFRAN ODT) 4 MG disintegrating tablet Take 1 tablet (4 mg total) by mouth every 8 (eight) hours as needed for nausea or vomiting. 09/12/18   Nena Polio, MD  PROAIR HFA 108 [phone] BASE) MCG/ACT inhaler INHALE 2 PUFFS EVERY 6 HOURS AS NEEDED FOR WHEEZING 03/13/15   [name], MD  Spacer/Aero-Holding [facility] ADVANTAGE) MISC 1 each by Other route once. Always uses her when you're using a metered-dose inhaler. You've aromatase medicine as much, he won't have his  much side effect, but you it twice as much medicine and your lungs. 06/08/15   Ahmed Prima, MD  temazepam (RESTORIL) 15 MG capsule TAKE 1 CAPSULE BY MOUTH AT BEDTIME AS NEEDED FOR SLEEP 06/13/18   [name], MD    Allergies Ace inhibitors; Benicar [olmesartan medoxomil]; Clarithromycin; Clarithromycin; Norvasc [amlodipine besylate]; Levofloxacin; and Zithromax [azithromycin dihydrate]  Family History  Problem Relation Age of Onset  . Kidney failure Mother   . Hypertension Mother   . Kidney disease Mother   . Multiple myeloma Daughter   . Cancer Daughter        multiple myeloma  . Kidney disease Daughter   . Heart disease Father   . Rheumatologic disease Father   . Kidney cancer Father     Social History Social History   Tobacco Use  . Smoking status: Never Smoker  . Smokeless tobacco: Never Used  Substance Use Topics  . Alcohol use: No    Comment: rare  . Drug use: No    Review of Systems  Constitutional: No fever/chills Eyes: No visual changes. ENT: No sore throat. Cardiovascular: Denies chest  pain. Respiratory: Denies shortness of breath. Gastrointestinal: See HPI Genitourinary: Negative for dysuria. Musculoskeletal: Negative for back pain. Skin: Negative for rash. Neurological: Negative for headaches, focal weakness   ____________________________________________   PHYSICAL EXAM:  VITAL SIGNS: ED Triage Vitals  Enc Vitals Group     BP 09/12/18 1749 127/71     Pulse Rate 09/12/18 1749 94     Resp 09/12/18 1749 19     Temp 09/12/18 1753 97.9 F (36.6 C)     Temp Source 09/12/18 1753 Oral     SpO2 09/12/18 1749 98 %     Weight 09/12/18 1754 140 lb (63.5 kg)     Height 09/12/18 1754 5' (1.524 m)     Head Circumference --      Peak Flow --      Pain Score 09/12/18 1753 8     Pain Loc --      Pain Edu? --      Excl. in [place]? --     Constitutional: Alert and oriented.  Ill-appearing and tired Eyes: Conjunctivae are normal. PER Head: Atraumatic. Nose: No congestion/rhinnorhea. Mouth/Throat: Mucous membranes are moist.  Oropharynx non-erythematous. Neck: No stridor.  Cardiovascular: Normal rate, regular rhythm. Grossly normal heart sounds.  Good peripheral circulation. Respiratory: Normal respiratory effort.  No retractions. Lungs CTAB. Gastrointestinal: Soft and nontender. No distention. No abdominal bruits. No CVA tenderness. Musculoskeletal: No lower extremity tenderness 1+ bilateral edema.   Neurologic:  Normal speech and language. No gross focal neurologic deficits are appreciated.  Skin:  Skin is warm, dry and intact. No rash noted.   ____________________________________________   LABS (all labs ordered are listed, but only abnormal results are displayed)  Labs Reviewed  CBC WITH DIFFERENTIAL/PLATELET - Abnormal; Notable for the following components:      Result Value   RBC 3.60 (*)    Hemoglobin 9.8 (*)    HCT 31.8 (*)    Platelets 557 (*)    Neutro Abs 7.8 (*)    All other components within normal limits  URINALYSIS, COMPLETE (UACMP) WITH  MICROSCOPIC - Abnormal; Notable for the following components:   Color, Urine STRAW (*)    APPearance CLEAR (*)    Hgb urine dipstick MODERATE (*)    All other components within normal limits  COMPREHENSIVE METABOLIC PANEL - Abnormal; Notable for the following components:  Potassium 3.3 (*)    Glucose, Bld 117 (*)    BUN 59 (*)    Creatinine, Ser 1.90 (*)    Calcium 8.3 (*)    Albumin 3.1 (*)    GFR calc non Af Amer 23 (*)    GFR calc Af Amer 27 (*)    All other components within normal limits  TROPONIN I - Abnormal; Notable for the following components:   Troponin I 0.03 (*)    All other components within normal limits  GASTROINTESTINAL PANEL BY PCR, STOOL (REPLACES STOOL CULTURE)  C DIFFICILE QUICK SCREEN W PCR REFLEX  LACTIC ACID, PLASMA  LIPASE, BLOOD   ____________________________________________  EKG  EKG read interpreted by me shows A. fib at a rate of 100 normal axis flipped T's inferiorly and 2 3 and F which are old flipped T waves in [place] and 6 are more prominent than January 17 of this year. ____________________________________________  [place]  ED MD interpretation: CT shows no new changes.  Radiology read the film  Official radiology report(s): Ct Abdomen Pelvis Wo Contrast  Result Date: 09/12/2018 CLINICAL DATA:  Nausea, vomiting, generalized abdominal pain EXAM: CT ABDOMEN AND PELVIS WITHOUT CONTRAST TECHNIQUE: Multidetector CT imaging of the abdomen and pelvis was performed following the standard protocol without IV contrast. COMPARISON:  08/23/2018, 06/21/2013, 09/30/2008 FINDINGS: Lower chest: Moderate right and small left pleural effusion. Right basilar compressive atelectasis. Hepatobiliary: No focal liver abnormality is seen. Status post cholecystectomy. No biliary dilatation. Pancreas: Unremarkable. No pancreatic ductal dilatation or surrounding inflammatory changes. Spleen: Normal in size without focal abnormality. Adrenals/Urinary Tract: Normal adrenal  glands. 2.4 x 2.9 cm intermediate density left posterior interpolar renal mass measuring 20 Hounsfield units. Stable 12 mm exophytic right upper pole renal mass. No urolithiasis or obstructive uropathy. Normal bladder. Stomach/Bowel: Large hiatal hernia. No bowel dilatation to suggest bowel obstruction. Diverticulosis without evidence of diverticulitis. Relative bowel wall thickening throughout the colon which may be secondary to underdistention versus mild colitis. No significant pericolonic inflammatory changes. Trace pelvic free fluid. No pneumatosis, pneumoperitoneum or portal venous gas. Vascular/Lymphatic: Abdominal aortic atherosclerosis. Normal caliber abdominal aorta. No lymphadenopathy. Reproductive: Status post hysterectomy. No adnexal masses. Other: Small fat containing right periumbilical hernia. No fluid collection or hematoma. No ascites. Musculoskeletal: No acute osseous abnormality. No aggressive osseous lesion. Degenerative disc disease with disc height loss throughout the thoracolumbar spine. Grade 1 anterolisthesis of L4 on L5 secondary to severe bilateral facet arthropathy. Bilateral foraminal stenosis at L3-4, L4-5 and L5-S1. Bilateral facet arthropathy throughout the thoracolumbar spine. IMPRESSION: 1. Relative bowel wall thickening throughout the colon without surrounding inflammatory changes which may be secondary to underdistention versus mild colitis. 2. No bowel obstruction. 3. Diverticulosis without evidence of diverticulitis. 4. Thoracolumbar spine spondylosis 5.  Aortic Atherosclerosis (ICD10-I70.0). 6. 2.4 x 2.9 cm indeterminate left posterior interpolar renal mass unchanged compared with 08/23/2018, but significantly enlarged compared with prior CT abdomen dated 06/21/2013. Recommend further characterization with a nonemergent MRI of the abdomen without and with intravenous contrast. Electronically Signed   By: Kathreen Devoid   On: 09/12/2018 23:41   Ct Head Wo Contrast  Result  Date: 09/12/2018 CLINICAL DATA:  Fall. Headache. Nausea and vomiting. EXAM: CT HEAD WITHOUT CONTRAST TECHNIQUE: Contiguous axial images were obtained from the base of the skull through the vertex without intravenous contrast. COMPARISON:  CT head 09/11/2018. FINDINGS: Brain: No evidence for acute infarction, hemorrhage, mass lesion, hydrocephalus, or extra-axial fluid. Age related atrophy. Hypoattenuation of white matter, likely small  vessel disease. No delayed intra cerebral hematoma. No concerning extra-axial hemorrhage related to the scalp hematoma nor is there contralateral visible injury. Vascular: Calcification of the cavernous internal carotid arteries consistent with cerebrovascular atherosclerotic disease. No signs of intracranial large vessel occlusion. Skull: LEFT parietal scalp hematoma. No underlying skull fracture. Sinuses/Orbits: No acute finding. Other: None. IMPRESSION: LEFT parietal scalp hematoma without underlying skull fracture or intracranial hemorrhage. No change compared with yesterday's CT head. Electronically Signed   By: Staci Righter M.D.   On: 09/12/2018 18:36    ____________________________________________   PROCEDURES  Procedure(s) performed:   Procedures  Critical Care performed:   ____________________________________________   INITIAL IMPRESSION / ASSESSMENT AND PLAN / ED COURSE  His nausea did not go away after the Zofran but did go away after the small dose of Phenergan.  However now she is having a lot of feeling of needing to move all the time.  I will let this wear off if at all possible she still having abdominal pain is mild but has been continuous and very annoying to her.  We will CT her belly without contrast as her GFR is low.  Should note the GFR is also stable.  Her troponin has been 0.03 since yesterday.   Signed out to oncoming doctor pending results of CT.  Anticipate discharge home.  I have provided her with Zofran prescription.  She is no longer  nauseated.  ----------------------------------------- 12:43 AM on 09/13/2018 -----------------------------------------  Patient's heart rate is gone up her A. fib which is usually controlled without anything is now running 110-129.  Stool specimens are still pending.  Akathisia seems to have resolved.  We will get her in the hospital to complete that work-up of her diarrhea and manage her A. fib.  We will continue slow IV hydration.     ____________________________________________   FINAL CLINICAL IMPRESSION(S) / ED DIAGNOSES  Final diagnoses:  Nausea  Atrial fibrillation with RVR (HCC)  Nausea vomiting and diarrhea     ED Discharge Orders         Ordered    ondansetron (ZOFRAN ODT) 4 MG disintegrating tablet  Every 8 hours PRN     09/12/18 2326           Note:  This document was prepared using Dragon voice recognition software and may include unintentional dictation errors.    Nena Polio, MD 09/12/18 [id number]    Nena Polio, MD 09/13/18 [phone]

## 2018-09-13 ENCOUNTER — Observation Stay: Admit: 2018-09-13 | Payer: PPO

## 2018-09-13 DIAGNOSIS — Z90710 Acquired absence of both cervix and uterus: Secondary | ICD-10-CM | POA: Diagnosis not present

## 2018-09-13 DIAGNOSIS — R197 Diarrhea, unspecified: Secondary | ICD-10-CM

## 2018-09-13 DIAGNOSIS — I4819 Other persistent atrial fibrillation: Secondary | ICD-10-CM | POA: Diagnosis present

## 2018-09-13 DIAGNOSIS — Z7901 Long term (current) use of anticoagulants: Secondary | ICD-10-CM | POA: Diagnosis not present

## 2018-09-13 DIAGNOSIS — F419 Anxiety disorder, unspecified: Secondary | ICD-10-CM | POA: Diagnosis present

## 2018-09-13 DIAGNOSIS — Z79890 Hormone replacement therapy (postmenopausal): Secondary | ICD-10-CM | POA: Diagnosis not present

## 2018-09-13 DIAGNOSIS — Z9842 Cataract extraction status, left eye: Secondary | ICD-10-CM | POA: Diagnosis not present

## 2018-09-13 DIAGNOSIS — K5790 Diverticulosis of intestine, part unspecified, without perforation or abscess without bleeding: Secondary | ICD-10-CM | POA: Diagnosis present

## 2018-09-13 DIAGNOSIS — E785 Hyperlipidemia, unspecified: Secondary | ICD-10-CM | POA: Diagnosis present

## 2018-09-13 DIAGNOSIS — M109 Gout, unspecified: Secondary | ICD-10-CM | POA: Diagnosis present

## 2018-09-13 DIAGNOSIS — Z888 Allergy status to other drugs, medicaments and biological substances status: Secondary | ICD-10-CM | POA: Diagnosis not present

## 2018-09-13 DIAGNOSIS — I10 Essential (primary) hypertension: Secondary | ICD-10-CM | POA: Diagnosis not present

## 2018-09-13 DIAGNOSIS — Z9049 Acquired absence of other specified parts of digestive tract: Secondary | ICD-10-CM | POA: Diagnosis not present

## 2018-09-13 DIAGNOSIS — N184 Chronic kidney disease, stage 4 (severe): Secondary | ICD-10-CM

## 2018-09-13 DIAGNOSIS — I5033 Acute on chronic diastolic (congestive) heart failure: Secondary | ICD-10-CM | POA: Diagnosis present

## 2018-09-13 DIAGNOSIS — I4891 Unspecified atrial fibrillation: Secondary | ICD-10-CM | POA: Diagnosis not present

## 2018-09-13 DIAGNOSIS — Z66 Do not resuscitate: Secondary | ICD-10-CM | POA: Diagnosis not present

## 2018-09-13 DIAGNOSIS — K219 Gastro-esophageal reflux disease without esophagitis: Secondary | ICD-10-CM | POA: Diagnosis present

## 2018-09-13 DIAGNOSIS — E039 Hypothyroidism, unspecified: Secondary | ICD-10-CM | POA: Diagnosis present

## 2018-09-13 DIAGNOSIS — Z841 Family history of disorders of kidney and ureter: Secondary | ICD-10-CM | POA: Diagnosis not present

## 2018-09-13 DIAGNOSIS — Z881 Allergy status to other antibiotic agents status: Secondary | ICD-10-CM | POA: Diagnosis not present

## 2018-09-13 DIAGNOSIS — R110 Nausea: Secondary | ICD-10-CM | POA: Diagnosis present

## 2018-09-13 DIAGNOSIS — Z7951 Long term (current) use of inhaled steroids: Secondary | ICD-10-CM | POA: Diagnosis not present

## 2018-09-13 DIAGNOSIS — I340 Nonrheumatic mitral (valve) insufficiency: Secondary | ICD-10-CM | POA: Diagnosis present

## 2018-09-13 DIAGNOSIS — R112 Nausea with vomiting, unspecified: Secondary | ICD-10-CM | POA: Diagnosis present

## 2018-09-13 DIAGNOSIS — G2581 Restless legs syndrome: Secondary | ICD-10-CM | POA: Diagnosis present

## 2018-09-13 DIAGNOSIS — F329 Major depressive disorder, single episode, unspecified: Secondary | ICD-10-CM | POA: Diagnosis present

## 2018-09-13 DIAGNOSIS — I130 Hypertensive heart and chronic kidney disease with heart failure and stage 1 through stage 4 chronic kidney disease, or unspecified chronic kidney disease: Secondary | ICD-10-CM

## 2018-09-13 DIAGNOSIS — Z9841 Cataract extraction status, right eye: Secondary | ICD-10-CM | POA: Diagnosis not present

## 2018-09-13 DIAGNOSIS — R0602 Shortness of breath: Secondary | ICD-10-CM | POA: Diagnosis not present

## 2018-09-13 LAB — GASTROINTESTINAL PANEL BY PCR, STOOL (REPLACES STOOL CULTURE)

## 2018-09-13 LAB — BASIC METABOLIC PANEL
ANION GAP: 10 (ref 5–15)
BUN: 56 mg/dL — ABNORMAL HIGH (ref 8–23)
CO2: 23 mmol/L (ref 22–32)
Calcium: 7.9 mg/dL — ABNORMAL LOW (ref 8.9–10.3)
Chloride: 101 mmol/L (ref 98–111)
Creatinine, Ser: 1.87 mg/dL — ABNORMAL HIGH (ref 0.44–1.00)
GFR calc Af Amer: 28 mL/min — ABNORMAL LOW (ref >60)
GFR calc non Af Amer: 24 mL/min — ABNORMAL LOW (ref >60)
Glucose, Bld: 96 mg/dL (ref 70–99)
Potassium: 3.1 mmol/L — ABNORMAL LOW (ref 3.5–5.1)
Sodium: 134 mmol/L — ABNORMAL LOW (ref 135–145)

## 2018-09-13 LAB — CBC
HCT: 31.9 % — ABNORMAL LOW (ref 36.0–46.0)
Hemoglobin: 9.5 g/dL — ABNORMAL LOW (ref 12.0–15.0)
MCH: 26.8 pg (ref 26.0–34.0)
MCHC: 29.8 g/dL — ABNORMAL LOW (ref 30.0–36.0)
MCV: 89.9 fL (ref 80.0–100.0)
PLATELETS: 511 10*3/uL — AB (ref 150–400)
RBC: 3.55 MIL/uL — ABNORMAL LOW (ref 3.87–5.11)
RDW: 14.9 % (ref 11.5–15.5)
WBC: 9.1 10*3/uL (ref 4.0–10.5)
nRBC: 0 % (ref 0.0–0.2)

## 2018-09-13 LAB — C DIFFICILE QUICK SCREEN W PCR REFLEX
C DIFFICLE (CDIFF) ANTIGEN: NEGATIVE
C Diff interpretation: NOT DETECTED
C Diff toxin: NEGATIVE

## 2018-09-13 LAB — TSH: TSH: 0.478 u[IU]/mL (ref 0.350–4.500)

## 2018-09-13 LAB — MAGNESIUM: MAGNESIUM: 1.7 mg/dL (ref 1.7–2.4)

## 2018-09-13 MED ORDER — DILTIAZEM HCL ER COATED BEADS 180 MG PO CP24
180.0000 mg | ORAL_CAPSULE | Freq: Every day | ORAL | Status: DC
  Administered 2018-09-13 – 2018-09-15 (×2): 180 mg via ORAL
  Filled 2018-09-13 (×3): qty 1

## 2018-09-13 MED ORDER — IPRATROPIUM-ALBUTEROL 0.5-2.5 (3) MG/3ML IN SOLN: 3.0000 mL | Freq: Four times a day (QID) | RESPIRATORY_TRACT | Status: DC | PRN

## 2018-09-13 MED ORDER — FLORANEX PO PACK
1.0000 g | PACK | Freq: Three times a day (TID) | ORAL | Status: DC
  Administered 2018-09-13 – 2018-09-15 (×4): 1 g via ORAL
  Filled 2018-09-13 (×8): qty 1

## 2018-09-13 MED ORDER — SODIUM CHLORIDE 0.9 % IV SOLN
INTRAVENOUS | Status: DC
  Administered 2018-09-13 (×2): via INTRAVENOUS

## 2018-09-13 MED ORDER — ACETAMINOPHEN 325 MG PO TABS: 650.0000 mg | ORAL_TABLET | Freq: Four times a day (QID) | ORAL | Status: DC | PRN

## 2018-09-13 MED ORDER — LOPERAMIDE HCL 2 MG PO CAPS
2.0000 mg | ORAL_CAPSULE | ORAL | Status: DC | PRN
  Administered 2018-09-13 – 2018-09-15 (×3): 2 mg via ORAL
  Filled 2018-09-13 (×3): qty 1

## 2018-09-13 MED ORDER — ONDANSETRON HCL 4 MG/2ML IJ SOLN
4.0000 mg | Freq: Four times a day (QID) | INTRAMUSCULAR | Status: DC | PRN
  Administered 2018-09-14: 4 mg via INTRAVENOUS
  Filled 2018-09-13: qty 2

## 2018-09-13 MED ORDER — PANTOPRAZOLE SODIUM 40 MG PO TBEC
40.0000 mg | DELAYED_RELEASE_TABLET | Freq: Two times a day (BID) | ORAL | Status: DC
  Administered 2018-09-13 – 2018-09-15 (×5): 40 mg via ORAL
  Filled 2018-09-13 (×5): qty 1

## 2018-09-13 MED ORDER — BUDESONIDE 0.25 MG/2ML IN SUSP
0.2500 mg | Freq: Two times a day (BID) | RESPIRATORY_TRACT | Status: DC
  Administered 2018-09-13 – 2018-09-15 (×4): 0.25 mg via RESPIRATORY_TRACT
  Filled 2018-09-13 (×4): qty 2

## 2018-09-13 MED ORDER — ROPINIROLE HCL 0.25 MG PO TABS
0.2500 mg | ORAL_TABLET | Freq: Three times a day (TID) | ORAL | Status: DC
  Administered 2018-09-13 – 2018-09-15 (×8): 0.25 mg via ORAL
  Filled 2018-09-13 (×9): qty 1

## 2018-09-13 MED ORDER — SODIUM CHLORIDE 0.9% FLUSH
3.0000 mL | Freq: Two times a day (BID) | INTRAVENOUS | Status: DC
  Administered 2018-09-13 – 2018-09-14 (×4): 3 mL via INTRAVENOUS

## 2018-09-13 MED ORDER — POTASSIUM CHLORIDE CRYS ER 20 MEQ PO TBCR
40.0000 meq | EXTENDED_RELEASE_TABLET | Freq: Once | ORAL | Status: AC
  Administered 2018-09-14: 40 meq via ORAL
  Filled 2018-09-13: qty 2

## 2018-09-13 MED ORDER — DILTIAZEM HCL 25 MG/5ML IV SOLN
5.0000 mg | Freq: Once | INTRAVENOUS | Status: AC
  Administered 2018-09-13: 5 mg via INTRAVENOUS
  Filled 2018-09-13: qty 5

## 2018-09-13 MED ORDER — ENSURE ENLIVE PO LIQD
237.0000 mL | Freq: Two times a day (BID) | ORAL | Status: DC
  Administered 2018-09-15: 237 mL via ORAL

## 2018-09-13 MED ORDER — POTASSIUM CHLORIDE 20 MEQ PO PACK
40.0000 meq | PACK | Freq: Once | ORAL | Status: AC
  Administered 2018-09-13: 40 meq via ORAL
  Filled 2018-09-13: qty 2

## 2018-09-13 MED ORDER — ESCITALOPRAM OXALATE 10 MG PO TABS
10.0000 mg | ORAL_TABLET | Freq: Every day | ORAL | Status: DC
  Administered 2018-09-13 – 2018-09-14 (×2): 10 mg via ORAL
  Filled 2018-09-13 (×4): qty 1

## 2018-09-13 MED ORDER — SODIUM CHLORIDE 0.9% FLUSH: 3.0000 mL | INTRAVENOUS | Status: DC | PRN

## 2018-09-13 MED ORDER — LEVOTHYROXINE SODIUM 100 MCG PO TABS
100.0000 ug | ORAL_TABLET | Freq: Every day | ORAL | Status: DC
  Administered 2018-09-13 – 2018-09-15 (×3): 100 ug via ORAL
  Filled 2018-09-13 (×3): qty 1

## 2018-09-13 MED ORDER — ACETAMINOPHEN 650 MG RE SUPP: 650.0000 mg | Freq: Four times a day (QID) | RECTAL | Status: DC | PRN

## 2018-09-13 MED ORDER — LACTINEX PO CHEW
1.0000 | CHEWABLE_TABLET | Freq: Three times a day (TID) | ORAL | Status: DC
  Filled 2018-09-13 (×2): qty 1

## 2018-09-13 MED ORDER — ALPRAZOLAM 0.5 MG PO TABS: 0.5000 mg | ORAL_TABLET | Freq: Two times a day (BID) | ORAL | Status: DC | PRN

## 2018-09-13 MED ORDER — ADULT MULTIVITAMIN W/MINERALS CH
1.0000 | ORAL_TABLET | Freq: Every day | ORAL | Status: DC
  Administered 2018-09-14 – 2018-09-15 (×2): 1 via ORAL
  Filled 2018-09-13 (×2): qty 1

## 2018-09-13 MED ORDER — SODIUM CHLORIDE 0.9 % IV SOLN: 250.0000 mL | INTRAVENOUS | Status: DC

## 2018-09-13 MED ORDER — APIXABAN 2.5 MG PO TABS
2.5000 mg | ORAL_TABLET | Freq: Two times a day (BID) | ORAL | Status: DC
  Administered 2018-09-13 – 2018-09-15 (×6): 2.5 mg via ORAL
  Filled 2018-09-13 (×6): qty 1

## 2018-09-13 MED ORDER — ONDANSETRON HCL 4 MG PO TABS: 4.0000 mg | ORAL_TABLET | Freq: Four times a day (QID) | ORAL | Status: DC | PRN

## 2018-09-13 NOTE — Consult Note (Signed)
Cardiology Consultation:   Patient ID: KIMBERLEE SHOUN MRN: [id number]; DOB: [birth year]  Admit date: 09/12/2018 Date of Consult: 09/13/2018  Primary Care Provider: [name], MD Primary Cardiologist: Kathlyn Sacramento, MD  Primary Electrophysiologist:  None    Patient Profile:   DENIJAH KARRER is a 85 y.o. female with a hx of persistent Afib (Eliquis), HFpEF (EF 55-60%), HTN, CAD (treated medically), CKDIV (followed by [facility]) and associated anemia of chronic disease, asthma/COPD, hypothyroidism, and Barrett's esophagus who is being seen today for the evaluation of Afib with RVR who is being seen today for the evaluation of Afib with RVR at the request of Dr. Jannifer Franklin.  History of Present Illness:   Ms. Godbee 85 year old female who has been seen by [facility] for paroxysmal atrial fibrillation and chronic diastolic heart failure.    03/28/2017: DCCV with successful cardioversion to SR.  04/2017: [facility] admission with worsening SOB and found to be volume overloaded with severe HTN on presentation. TTE showed normal LV systolic function 53-66% with inferior wall hypokinesis, G2DD, moderate MR, and moderate pulmonary hypertension with PASP of 52 mmHg. Improved slowly with IV diuresis, which was limited by her CKD.  NM stress testing, during which she experienced severe hypotension with stress testing that reversed with aminophylline and 512m saline bolus. NM study showed prior inferior/inferior lateral infarct with minimal peri-infarct ischemia. Since then, she has been treated medically for likely underlying CAD, given her age and advanced CKD, as well as lack of anginal sx.    09/19/[facility]. She was treated with IVF with improvement in renal function. 07/03/2018 outpatient [facility] visit at which time attending pulmonary rehab with overall improvement in stamina.  Other than HTN, she had no  cardiac complaints.  Plan was for continued medical management. It was noted that small dose metoprolol was discontinued during a previous visit due to symptomatic bradycardia.    Seen in 08/2018 for abdominal pain and noted to be in Afib with RVR. She noted that she had several episodes of diarrhea, which she attributed to abx, and over the last 6 days as she was being treated for bronchitis. She also experienced emesis x3 after eating out earlier in the week, followed by loose diarrhea. She was not eating or drinking well. She was started on IV diltiazem for rate control given rates of up to 130s. This was then weaned later that day for adequate HR and hypotension. EKG showed Afib with ventricular rate 65bpm and nonspecific ST/T wave abnormalities in II, III, aVF.  [facility]   [facility] felt dizzy while trying to grab a pot from under the stove, resulting in her falling and hitting her head on the floor. Head CT was without acute intracranial findings and she was discharged home with instructions to follow-up with primary care. She presented again to the ED 09/12/2018 due to c/o reduced oral intake of food x3 weeks, nausea x1 week, emesis / dry heaving x1 day, and diarrhea x several days (at least four episodes).  Associated sx included weakness and feeling fatigued, as well as stomach cramping. She denied any blood in her stool / PRBPR / melena, blood with cough, blood with emesis / dry heaving, and she denied hematuria. She also denied any chest pain or palpitations. She did note that she  occasionally felt her heart racing, especially in the last few days. She also reported occasional SOB/DOE, but she attributed this to her ongoing lung issues, including recent pna, rather than the Afib. She reported that she is usually asymptomatic with her atrial fibrillation and, to her knowledge, rate controlled. She reported  reduced oral intake over the last 3 weeks of "just a few bites of food," but she was trying to remain hydrated with ginger-ale and other fluids. In the ED, EKG showed Afib with ventricular rate of 100bpm and TWI in II, III, aVF, consistent with previous EKGs and IVCD noted as well / incomplete LBBB. CT abdomen pelvis showed bowel thickening / mild colitis, diverticulosis, aortic atherosclerosis, and unchanged renal mass from previous scan. Cdiff negative. CT head showed scalp hematoma without underlying skull fracture or intracranial hemorrhage and no change from the previous day's scan. CXR showed increasing bilateral pleural effusions with R>L. She was started on ondansetron and given a small dose IV Cardizem for ventricular rate control with cardiology consulted. Troponin was noted to be  flat trending at 0.03 and ventricular rates 120s-130s with occasional 150s. Today 2/6, she continues to deny any chest pain but does report she occasionally feels her heart racing. Per telemetry, rates have remained control with 1 episode of reaching the 140s earlier this AM and around 8:30AM.   Past Medical History:  Diagnosis Date  . Asthma   . Barrett esophagus   . Chronic bronchitis   . Chronic kidney disease (CKD), stage IV (severe) ([place])   . Depression   . Diverticulosis   . GERD (gastroesophageal reflux disease)   . Gout   . Hyperlipidemia   . Hypertension   . Hypothyroid   . Mitral regurgitation    a. TTE 02/2017: EF 55-60%, normal WM, calcified mitral annulus with mod regurg, moderately dilated LA, unable to estimate PASP  . Pancreatitis due to biliary obstruction March 2010   s/p ERCP sphincterotomy, cholecystectomy  . Peripheral neuralgia   . Persistent atrial fibrillation    a. diagnosed 01/24/17; CHADS2VASc => 5 (HTN, age x 2, vascular disease with PAD and aortic plaque, female) giving her an estimated annual stroke risk of 6.7%; b. successful DCCV 03/28/2017; c. on Eliquis  . Venous  insufficiency     Past Surgical History:  Procedure Laterality Date  . ABDOMINAL HYSTERECTOMY    . CARDIOVERSION N/A 03/28/2017   Procedure: CARDIOVERSION;  Surgeon: Nelva Bush, MD;  Location: [facility];  Service: Cardiovascular;  Laterality: N/A;  . CARPAL TUNNEL RELEASE  jan 2013   [name]  . CATARACT EXTRACTION     right  . CATARACT EXTRACTION  2008  . CHOLECYSTECTOMY  02/10  . CHOLECYSTECTOMY  2010  . TONSILLECTOMY    . VENTRAL HERNIA REPAIR    . VENTRAL HERNIA REPAIR  2007  . VESICOVAGINAL FISTULA CLOSURE W/ TAH       Home Medications:  Prior to Admission medications   Medication Sig Start Date End Date Taking? Authorizing Provider  ALPRAZolam Duanne Moron) 0.5 MG tablet Take 1 tablet (0.5 mg total) by mouth 2 (two) times daily as needed for anxiety. 02/07/18  Yes [name], MD  apixaban (ELIQUIS) 2.5 MG TABS tablet Take 1 tablet (2.5 mg total) by mouth 2 (two) times daily. 02/12/18  Yes Wellington Hampshire, MD  budesonide (PULMICORT) 0.25 MG/2ML nebulizer solution USE ONE (1) VIAL IN NEBULIZER TWO TIMES PER DAY 08/21/18  Yes [name], MD  cetirizine (ZYRTEC ALLERGY) 10  MG tablet Take 10 mg by mouth daily.   Yes [provider]  Cholecalciferol (VITAMIN D-3) 1000 units CAPS Take 1,000 Units by mouth daily.    Yes [provider]  doxycycline (VIBRA-TABS) 100 MG tablet Take 1 tablet (100 mg total) by mouth 2 (two) times daily. 09/04/18  Yes Guse, Jacquelynn Cree, FNP  escitalopram (LEXAPRO) 10 MG tablet Take 1 tablet (10 mg total) by mouth daily. After dinner 01/29/18  Yes [name], MD  furosemide (LASIX) 40 MG tablet Take 80 mg by mouth daily.    Yes [provider]  ipratropium-albuterol (DUONEB) 0.5-2.5 (3) MG/3ML SOLN Take 3 mLs by nebulization every 6 (six) hours as needed. 08/27/18  Yes [name], MD  levothyroxine (SYNTHROID, LEVOTHROID) 100 MCG tablet TAKE 1 TABLET BY MOUTH DAILY FOR THYROID 09/10/18  Yes [name], MD  losartan  (COZAAR) 50 MG tablet TAKE ONE TABLET EVERY DAY 08/09/18  Yes [name], MD  omeprazole (PRILOSEC) 40 MG capsule TAKE ONE CAPSULE BY MOUTH TWICE A DAY. BEFORE BREAKFAST AND SUPPER 03/13/18  Yes [name], MD  rOPINIRole (REQUIP) 0.25 MG tablet TAKE ONE TABLET 3 TIMES DAILY 07/26/18  Yes [name], MD  vitamin B-12 (CYANOCOBALAMIN) 1000 MCG tablet Take 1,000 mcg by mouth daily.   Yes [provider]  acetaminophen (TYLENOL) 650 MG CR tablet Take 650 mg by mouth every 8 (eight) hours as needed for pain.     [provider]  benzonatate (TESSALON) 200 MG capsule Take 1 capsule (200 mg total) by mouth 3 (three) times daily as needed for cough. 08/27/18   [name], MD  docusate sodium (COLACE) 100 MG capsule Take 1 capsule (100 mg total) by mouth 2 (two) times daily as needed for mild constipation. 08/26/18   Epifanio Lesches, MD  fluticasone (FLONASE) 50 MCG/ACT nasal spray Place 2 sprays into the nose daily. Patient taking differently: Place 2 sprays into the nose daily as needed (For allergies.).  01/30/13   [name], MD  methylPREDNISolone (MEDROL DOSEPAK) 4 MG TBPK tablet Take according to pack instructions 09/04/18   Jodelle Green, FNP  mupirocin ointment (BACTROBAN) 2 % Place 1 application into the nose 2 (two) times daily. 05/01/18   [name], MD  ondansetron (ZOFRAN ODT) 4 MG disintegrating tablet Take 1 tablet (4 mg total) by mouth every 8 (eight) hours as needed for nausea or vomiting. 09/12/18   Nena Polio, MD  PROAIR HFA [phone] BASE) MCG/ACT inhaler INHALE 2 PUFFS EVERY 6 HOURS AS NEEDED FOR WHEEZING 03/13/15   [name], MD  Spacer/Aero-Holding Chambers [facility] ADVANTAGE) MISC 1 each by Other route once. Always uses her when you're using a metered-dose inhaler. You've aromatase medicine as much, he won't have his much side effect, but you it twice as much medicine and your lungs. 06/08/15   Ahmed Prima, MD  temazepam  (RESTORIL) 15 MG capsule TAKE 1 CAPSULE BY MOUTH AT BEDTIME AS NEEDED FOR SLEEP 06/13/18   [name], MD    Inpatient Medications: Scheduled Meds: . apixaban  2.5 mg Oral BID  . budesonide  0.25 mg Nebulization BID  . diltiazem  180 mg Oral Daily  . escitalopram  10 mg Oral Daily  . levothyroxine  100 mcg Oral QAC breakfast  . pantoprazole  40 mg Oral BID AC  . potassium chloride  40 mEq Oral Once  . rOPINIRole  0.25 mg  Oral TID   Continuous Infusions: . sodium chloride 75 mL/hr at 09/13/18 0344   PRN Meds: acetaminophen **OR** acetaminophen, ALPRAZolam, ipratropium-albuterol, ondansetron **OR** ondansetron (ZOFRAN) IV  Allergies:    Allergies  Allergen Reactions  . Ace Inhibitors Cough  . Benicar [Olmesartan Medoxomil]     Unsure of reaction type  . Clarithromycin Other (See Comments)    Blisters in mouth  . Clarithromycin Other (See Comments)    Unknown   . Norvasc [Amlodipine Besylate]   . Levofloxacin Rash  . Zithromax [Azithromycin Dihydrate] Rash    Social History:   Social History   Socioeconomic History  . Marital status: Widowed    Spouse name: Not on file  . Number of children: 2  . Years of education: Not on file  . Highest education level: Not on file  Occupational History  . 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 Financial resource strain: Not hard at all  . Food insecurity:    Worry: Never true    Inability: Never true  . Transportation needs:    Medical: No    Non-medical: No  Tobacco Use  . Smoking status: Never Smoker  . Smokeless tobacco: Never Used  Substance and Sexual Activity  . Alcohol use: No    Comment: rare  . Drug use: No  . Sexual activity: Never  Lifestyle  . Physical activity:    Days per week: Not on file    Minutes per session: Not on file  . Stress: Not on file  Relationships  . Social connections:    Talks on phone: Not on file    Gets together: Not on file    Attends religious  service: Not on file    Active member of club or organization: Not on file    Attends meetings of clubs or organizations: Not on file    Relationship status: Not on file  . Intimate partner violence:    Fear of current or ex partner: Not on file    Emotionally abused: Not on file    Physically abused: Not on file    Forced sexual activity: Not on file  Other Topics Concern  . Not on file  Social History Narrative   ** Merged History Encounter **       Widowed. Has 2 children and lives alone.   Lives by herself. Has a cane, but ambulates independently    Family History:    Family History  Problem Relation Age of Onset  . Kidney failure Mother   . Hypertension Mother   . Kidney disease Mother   . Multiple myeloma Daughter   . Cancer Daughter        multiple myeloma  . Kidney disease Daughter   . Heart disease Father   . Rheumatologic disease Father   . Kidney cancer Father      ROS:  Please see the history of present illness.  Review of Systems  Constitutional: Positive for malaise/fatigue and weight loss.       Reduced appetite   Respiratory: Positive for cough, sputum production and shortness of breath. Negative for hemoptysis and wheezing.   Cardiovascular: Negative for chest pain and palpitations.       Feel heart racing at times  Gastrointestinal: Positive for abdominal pain, diarrhea, nausea and vomiting. Negative for blood in stool and melena.       More dry heave than emesis  Genitourinary: Negative for dysuria and hematuria.  Musculoskeletal:  Positive for falls.       Fell 2/4  Neurological: Positive for dizziness and weakness.  Psychiatric/Behavioral: The patient is nervous/anxious.   All other systems reviewed and are negative.   All other ROS reviewed and negative.     Physical Exam/Data:   Vitals:   09/13/18 0130 09/13/18 0239 09/13/18 0240 09/13/18 0748  BP: 121/85  (!) 142/96 (!) 142/75  Pulse: (!) 103  (!) 117 (!) 103  Resp: 18  20 19   Temp:    (!) 97.5 F (36.4 C) 97.7 F (36.5 C)  TempSrc:   Oral Oral  SpO2: 92%  94% 98%  Weight:  60.3 kg    Height:  5' (1.524 m)      Intake/Output Summary (Last 24 hours) at 09/13/2018 0824 Last data filed at 09/13/2018 0350 Gross per 24 hour  Intake -  Output 0 ml  Net 0 ml   Filed Weights   09/12/18 1754 09/13/18 0239  Weight: 63.5 kg 60.3 kg   Body mass index is 25.96 kg/m.  General:  Elderly female, weak but in no acute distress HEENT: normal Neck: no JVD but elevated JVP Vascular: No carotid bruits; radial pulses 2+ bilaterally Cardiac:  IRIR; no m/r/g Lungs: wheezing and rhonchi noted throughout upper lung fields. Reduced bibasilar breath sounds R>L Abd: TTP, no hepatomegaly  Ext: mild lower extremity edema Musculoskeletal:  No deformities Skin: warm and dry  Neuro: no focal abnormalities noted Psych:  Normal affect   EKG:  The EKG was personally reviewed and demonstrates: Afib with RVR, 100bpm, IVCD Telemetry:  Telemetry was personally reviewed and demonstrates:  Afib with ventricular rates 90s-low 100s, earlier episode up to 140s around 8:30AM but controlled since. Occasional ectopy.   Relevant CV Studies:  Pending update, ordered by IM MD  08/24/2018 TTE Left ventricle: The cavity size was normal. There was mild   concentric hypertrophy. Systolic function was normal. The   estimated ejection fraction was in the range of 55% to 60%. Wall   motion was normal; there were no regional wall motion   abnormalities. The study is not technically sufficient to allow   evaluation of LV diastolic function. - Mitral valve: There was mild regurgitation. - Left atrium: The atrium was moderately dilated. - Right ventricle: Systolic function was normal. - Pulmonary arteries: Systolic pressure was within the normal   range.  04/18/2017 TTE - Left ventricle: The cavity size was normal. There was mild focal basal and mild concentric hypertrophy of the septum.  Systolic function was normal. The estimated ejection fraction was in the range of 55% to 60%. Hypokinesis of the inferior myocardium. Features are consistent with a pseudonormal left ventricular filling pattern, with concomitant abnormal relaxation and increased filling pressure (grade 2 diastolic dysfunction). - Mitral valve: There was moderate regurgitation. - Left atrium: The atrium was moderately dilated. - Right ventricle: Systolic function was normal. - Atrial septum: A patent foramen ovale cannot be excluded. - Pulmonary arteries: Systolic pressure was moderately elevated. PA peak pressure: 52 mm Hg (S).  04/17/2017 NM Study/ Lexi  T wave inversion was noted during stress in the III, V3 and V4 leads.  There was no ST segment deviation noted during stress.  Defect 1: There is a medium defect of severe severity present in the basal inferior, basal inferolateral, mid inferior and mid inferolateral location.  Findings consistent with prior inferior/inferolateral myocardial infarction with peri-infarct ischemia.  This is an intermediate risk study.  The left ventricular ejection fraction  is normal (55-65%).  Severe hypotension with Lexiscan which was reversed with Aminophyllin. 500 mL of saline bolus was given with resolution of hypotension.   Laboratory Data:  Chemistry Recent Labs  Lab 09/11/18 1710 09/12/18 1915 09/13/18 0322  NA 133* 135 134*  K 4.3 3.3* 3.1*  CL 98 102 101  CO2 25 23 23   GLUCOSE 111* 117* 96  BUN 61* 59* 56*  CREATININE 1.91* 1.90* 1.87*  CALCIUM 8.7* 8.3* 7.9*  GFRNONAA 23* 23* 24*  GFRAA 27* 27* 28*  ANIONGAP 10 10 10     Recent Labs  Lab 09/12/18 1915  PROT 6.5  ALBUMIN 3.1*  AST 19  ALT 13  ALKPHOS 70  BILITOT 1.2   Hematology Recent Labs  Lab 09/11/18 1710 09/12/18 1915 09/13/18 0322  WBC 10.1 9.2 9.1  RBC 3.80* 3.60* 3.55*  HGB 10.4* 9.8* 9.5*  HCT 33.7* 31.8* 31.9*  MCV 88.7 88.3 89.9  MCH 27.4 27.2  26.8  MCHC 30.9 30.8 29.8*  RDW 14.9 14.9 14.9  PLT 717* 557* 511*   Cardiac Enzymes Recent Labs  Lab 09/11/18 1710 09/11/18 2022 09/12/18 1915  TROPONINI 0.03* 0.03* 0.03*   No results for input(s): TROPIPOC in the last 168 hours.  BNPNo results for input(s): BNP, PROBNP in the last 168 hours.  DDimer No results for input(s): DDIMER in the last 168 hours.  Radiology/Studies:  Ct Abdomen Pelvis Wo Contrast  Result Date: 09/12/2018 CLINICAL DATA:  Nausea, vomiting, generalized abdominal pain EXAM: CT ABDOMEN AND PELVIS WITHOUT CONTRAST TECHNIQUE: Multidetector CT imaging of the abdomen and pelvis was performed following the standard protocol without IV contrast. COMPARISON:  08/23/2018, 06/21/2013, 09/30/2008 FINDINGS: Lower chest: Moderate right and small left pleural effusion. Right basilar compressive atelectasis. Hepatobiliary: No focal liver abnormality is seen. Status post cholecystectomy. No biliary dilatation. Pancreas: Unremarkable. No pancreatic ductal dilatation or surrounding inflammatory changes. Spleen: Normal in size without focal abnormality. Adrenals/Urinary Tract: Normal adrenal glands. 2.4 x 2.9 cm intermediate density left posterior interpolar renal mass measuring 20 Hounsfield units. Stable 12 mm exophytic right upper pole renal mass. No urolithiasis or obstructive uropathy. Normal bladder. Stomach/Bowel: Large hiatal hernia. No bowel dilatation to suggest bowel obstruction. Diverticulosis without evidence of diverticulitis. Relative bowel wall thickening throughout the colon which may be secondary to underdistention versus mild colitis. No significant pericolonic inflammatory changes. Trace pelvic free fluid. No pneumatosis, pneumoperitoneum or portal venous gas. Vascular/Lymphatic: Abdominal aortic atherosclerosis. Normal caliber abdominal aorta. No lymphadenopathy. Reproductive: Status post hysterectomy. No adnexal masses. Other: Small fat containing right periumbilical  hernia. No fluid collection or hematoma. No ascites. Musculoskeletal: No acute osseous abnormality. No aggressive osseous lesion. Degenerative disc disease with disc height loss throughout the thoracolumbar spine. Grade 1 anterolisthesis of L4 on L5 secondary to severe bilateral facet arthropathy. Bilateral foraminal stenosis at L3-4, L4-5 and L5-S1. Bilateral facet arthropathy throughout the thoracolumbar spine. IMPRESSION: 1. Relative bowel wall thickening throughout the colon without surrounding inflammatory changes which may be secondary to underdistention versus mild colitis. 2. No bowel obstruction. 3. Diverticulosis without evidence of diverticulitis. 4. Thoracolumbar spine spondylosis 5.  Aortic Atherosclerosis (ICD10-I70.0). 6. 2.4 x 2.9 cm indeterminate left posterior interpolar renal mass unchanged compared with 08/23/2018, but significantly enlarged compared with prior CT abdomen dated 06/21/2013. Recommend further characterization with a nonemergent MRI of the abdomen without and with intravenous contrast. Electronically Signed   By: Kathreen Devoid   On: 09/12/2018 23:41   Dg Chest 2 View  Result Date: 09/11/2018 CLINICAL DATA:  85 year old female who fell after becoming dizzy. EXAM: CHEST - 2 VIEW COMPARISON:  08/27/2018 and earlier. FINDINGS: Seated AP and lateral views of the chest. Increased bilateral pleural effusions since last month, small to moderate and greater on the right. No superimposed pulmonary edema. Stable superimposed cardiomegaly and mediastinal contours. Visualized tracheal air column is within normal limits. No pneumothorax. Stable visualized osseous structures. Abdominal Calcified aortic atherosclerosis. Negative visible bowel gas pattern. IMPRESSION: 1. Increased bilateral pleural effusions since last month, small to moderate in greater on the right. 2.  No acute traumatic injury identified. 3.  Aortic Atherosclerosis (ICD10-I70.0). Electronically Signed   By: Genevie Ann M.D.   On:  09/11/2018 19:17   Ct Head Wo Contrast  Result Date: 09/12/2018 CLINICAL DATA:  Fall. Headache. Nausea and vomiting. EXAM: CT HEAD WITHOUT CONTRAST TECHNIQUE: Contiguous axial images were obtained from the base of the skull through the vertex without intravenous contrast. COMPARISON:  CT head 09/11/2018. FINDINGS: Brain: No evidence for acute infarction, hemorrhage, mass lesion, hydrocephalus, or extra-axial fluid. Age related atrophy. Hypoattenuation of white matter, likely small vessel disease. No delayed intra cerebral hematoma. No concerning extra-axial hemorrhage related to the scalp hematoma nor is there contralateral visible injury. Vascular: Calcification of the cavernous internal carotid arteries consistent with cerebrovascular atherosclerotic disease. No signs of intracranial large vessel occlusion. Skull: LEFT parietal scalp hematoma. No underlying skull fracture. Sinuses/Orbits: No acute finding. Other: None. IMPRESSION: LEFT parietal scalp hematoma without underlying skull fracture or intracranial hemorrhage. No change compared with yesterday's CT head. Electronically Signed   By: Staci Righter M.D.   On: 09/12/2018 18:36   Ct Head Wo Contrast  Result Date: 09/11/2018 CLINICAL DATA:  [address] and hit her head on the floor with a resultant posterior lump. She fell due to dizziness. EXAM: CT HEAD WITHOUT CONTRAST CT CERVICAL SPINE WITHOUT CONTRAST TECHNIQUE: Multidetector CT imaging of the head and cervical spine was performed following the standard protocol without intravenous contrast. Multiplanar CT image reconstructions of the cervical spine were also generated. COMPARISON:  Brain MR dated 06/01/2012. FINDINGS: CT HEAD FINDINGS Brain: Mild-to-moderate enlargement of the ventricles and subarachnoid spaces. Mild to moderate patchy white matter low density in both cerebral hemispheres. No intracranial hemorrhage, mass lesion or CT evidence of acute infarction. Vascular: No hyperdense vessel or  unexpected calcification. Skull: Normal. Negative for fracture or focal lesion. Sinuses/Orbits: Status post bilateral cataract extraction. Unremarkable paranasal sinuses. Other: Small left posterior scalp hematoma. CT CERVICAL SPINE FINDINGS Alignment: Mild reversal of the normal cervical lordosis. 3 mm of anterolisthesis at the C3-4 level. 2 mm of retrolisthesis at the C5-6 level. Skull base and vertebrae: No acute fracture. No primary bone lesion or focal pathologic process. Soft tissues and spinal canal: No prevertebral fluid or swelling. No visible canal hematoma. Disc levels: Multilevel degenerative changes, including facet degenerative changes. These are most pronounced on the right at the C3-4 level. The C2 and C3 facets are fused on the left. Upper chest: Large right pleural effusion and moderate-sized left pleural effusion. Other: Bilateral carotid artery calcifications. IMPRESSION: 1. Small left posterior scalp hematoma without skull fracture or intracranial hemorrhage. 2. No cervical spine fracture or traumatic subluxation. 3. Large right pleural effusion and moderate-sized left pleural effusion. 4. Mild to moderate diffuse cerebral and cerebellar atrophy. 5. Mild to moderate chronic small vessel white matter ischemic changes in both cerebral hemispheres. 6. Multilevel cervical spine degenerative changes with associated subluxations, as described above. 7. Bilateral carotid artery atheromatous calcifications. Electronically Signed  By: Claudie Revering M.D.   On: 09/11/2018 18:28   Ct Cervical Spine Wo Contrast  Result Date: 09/11/2018 CLINICAL DATA:  [address] and hit her head on the floor with a resultant posterior lump. She fell due to dizziness. EXAM: CT HEAD WITHOUT CONTRAST CT CERVICAL SPINE WITHOUT CONTRAST TECHNIQUE: Multidetector CT imaging of the head and cervical spine was performed following the standard protocol without intravenous contrast. Multiplanar CT image reconstructions of the cervical  spine were also generated. COMPARISON:  Brain MR dated 06/01/2012. FINDINGS: CT HEAD FINDINGS Brain: Mild-to-moderate enlargement of the ventricles and subarachnoid spaces. Mild to moderate patchy white matter low density in both cerebral hemispheres. No intracranial hemorrhage, mass lesion or CT evidence of acute infarction. Vascular: No hyperdense vessel or unexpected calcification. Skull: Normal. Negative for fracture or focal lesion. Sinuses/Orbits: Status post bilateral cataract extraction. Unremarkable paranasal sinuses. Other: Small left posterior scalp hematoma. CT CERVICAL SPINE FINDINGS Alignment: Mild reversal of the normal cervical lordosis. 3 mm of anterolisthesis at the C3-4 level. 2 mm of retrolisthesis at the C5-6 level. Skull base and vertebrae: No acute fracture. No primary bone lesion or focal pathologic process. Soft tissues and spinal canal: No prevertebral fluid or swelling. No visible canal hematoma. Disc levels: Multilevel degenerative changes, including facet degenerative changes. These are most pronounced on the right at the C3-4 level. The C2 and C3 facets are fused on the left. Upper chest: Large right pleural effusion and moderate-sized left pleural effusion. Other: Bilateral carotid artery calcifications. IMPRESSION: 1. Small left posterior scalp hematoma without skull fracture or intracranial hemorrhage. 2. No cervical spine fracture or traumatic subluxation. 3. Large right pleural effusion and moderate-sized left pleural effusion. 4. Mild to moderate diffuse cerebral and cerebellar atrophy. 5. Mild to moderate chronic small vessel white matter ischemic changes in both cerebral hemispheres. 6. Multilevel cervical spine degenerative changes with associated subluxations, as described above. 7. Bilateral carotid artery atheromatous calcifications. Electronically Signed   By: Claudie Revering M.D.   On: 09/11/2018 18:28   Dg Humerus Left  Result Date: 09/11/2018 CLINICAL DATA:  Left arm  pain following a fall this morning. EXAM: LEFT HUMERUS - 2+ VIEW COMPARISON:  None. FINDINGS: An IV is demonstrated in the antecubital fossa. No fracture or dislocation is seen. IMPRESSION: No fracture or dislocation. Electronically Signed   By: Claudie Revering M.D.   On: 09/11/2018 18:20    Assessment and Plan:   Persistent Afib with RVR - Previously asymptomatic in Afib per EMR /patient report; however, now symptomatic with SOB and racing heart this admission. Does not report any s/sx of bleeding. Continue Eliquis 2.31m bid. Plan for DCCV tomorrow 2/7 and waiting for anesthesia confirmation. If DCCV tomorrow, will plan for NPO after midnight in preparation for the procedure. No plan for rate control as rates controlled to bradycardic once in NSR. Internal medicine ordered echo. Given she is currently in Afib with RVR, recommend deferring echo until after the cardioversion for better images and assessment of EF.  Minimally elevated and flat trending troponin - Troponin 0.03 x3 in the setting rapid ventricular rate with recent emesis/diarrhea. Suspect that GI issues / discomfort triggered her rapid ventricular rate, which is consistent with her previous admission in 08/2018. Troponin at 0.03 flat trending and consistent with demand ischemia in setting of rapid rate and the above. No chest pain, EKG without acute changes. R/o ACS. No plan for ischemic workup at this time.   Hypokalemia - In setting of recent diarrhea and emesis.  Replete with goal 4.0. Ordered another KCl tab at 34mq. Recommend repeat AM BMET. Check Mg.  HFpEF - Showing signs consistent with volume overload on exam. CXR shows bilateral pulmonary  effusions with R>L. Last echo as above with EF greater than 55%. Repeat echo was ordered by IM MD with recommendation to defer until after tomorrow's DCCV. - Recommend wean off fluids as soon as possible to prevent volume overload given patient currently in Afib with rapid ventricular rate.  Suspect that this volume is also contributed to her elevated blood pressure. PTA diuretics held at admission d/t decreased oral intake. Recommendation to restart oral diuretics before discharge. Daily BMET. Renal function currently stable and appears at baseline.   HTN - Elevated SBP. Receiving IVF and diuretics held on admission. Recommend wean fluids and monitor BP / vitals, given the patient's history of hypotension with Cardizem and bradycardia once in NSR (with DCCV planned for tomorrow). If cannot schedule DCCV for tomorrow 2/7 d/t anesthesia schedule, could consider another small dose of Cardizem for HR/BP control.  CKDIV, anemia of chronic disease - Cr 1.87. [facility] with most recent report indicating this is her baseline renal function, per patient family. Daily BMET   For questions or updates, please contact [place] consult [email or web address] for contact info under     Signed, JArvil Chaco PA-C  09/13/2018 8:24 AM

## 2018-09-13 NOTE — Progress Notes (Signed)
Atrial fibrillation with RVR (HCC) -IV fluids being administered, I will give her a small dose of IV Cardizem to control her heart rate.  Audiology consult placed Active Problems:   Nausea vomiting and diarrhea -PRN antiemetics.  Patient was C. difficile negative, GI panel pending.  Possible colitis seen on CT imaging, will treat based on GI panel results   Hypertension -currently normotensive, hold antihypertensives for now   CKD (chronic kidney disease), stage IV (HCC) -at baseline, avoid nephrotoxins and monitor   Hypothyroidism -continue home dose thyroid replacement  Chart review performedand case discussed with ED provider. Labs, imaging and/or ECG reviewed by provider and discussed with patient/family. Management plans discussed with the patient and/or family.  DVT PROPHYLAXIS: Systemic anticoagulation  GI PROPHYLAXIS:     PPI   Agree with above plan of care

## 2018-09-13 NOTE — Progress Notes (Signed)
Patient and daughter tell me she has not missed any Eliquis doses for the last 3 days.

## 2018-09-13 NOTE — Progress Notes (Signed)
Patient admitted to 61 with the diagnosis of Afib RVR. A & O x 4. Denied any acute pain. Full assessment to epic completed. No acute distress noted. Will continue to monitor.

## 2018-09-13 NOTE — ED Notes (Signed)
Pt was cleaned, bedding was changed and stool samples were obtained from the Pt.

## 2018-09-13 NOTE — Progress Notes (Signed)
C/o nausea post potassium liquid.  Refuses Zofran at this time. Trying ginger ale.

## 2018-09-13 NOTE — Progress Notes (Signed)
Initial Nutrition Assessment  DOCUMENTATION CODES:   Not applicable  INTERVENTION:   Ensure Enlive po BID, each supplement provides 350 kcal and 20 grams of protein  MVI daily   GI soft diet   Pt at high refeed risk; recommend monitor K, Mg and P labs   NUTRITION DIAGNOSIS:   Inadequate oral intake related to acute illness as evidenced by meal completion < 25%.  GOAL:   Patient will meet greater than or equal to 90% of their needs  MONITOR:   PO intake, Supplement acceptance, Labs, Weight trends, Skin, I & O's  REASON FOR ASSESSMENT:   Malnutrition Screening Tool    ASSESSMENT:   85 y.o. female with a hx of persistent Afib (Eliquis), HFpEF (EF 55-60%), HTN, CAD (treated medically), CKDIV (followed by [facility]) and associated anemia of chronic disease, asthma/COPD, hypothyroidism, and Barrett's esophagus who is admitted with Afib with RVR, nausea and diarrhea    Met with pt in room today. RD familiar with this pt from recent previous admit ~2 weeks ago. Pt with poor appetite and oral intake at baseline; pt prefers small meals and snacks. Pt admitted 2 weeks ago with nausea, vomiting, abdominal pain and loose stools. Ct scan was negative for anything significant. Pt started drinking strawberry Ensure at home after her last admit; reports diarrhea started before she was drinking the Ensure. Pt reports a severely decreased appetite for the past 2 weeks that she relates to chronic diarrhea and abdominal pain. Pain and nausea are intermittent. Pt reports she vomited at home but has not vomited while here. Pt was eating <25% of meals during her last admit. Pt reports that she has no desire to eat today. Per chart, pt with 12lb(8%) wt loss since 1/28 but pt appears weight stable from her previous admit. Pt on probiotics. RD will add supplements and change pt to GI soft diet. Pt NPO for cardioversion tomorrow. CT scan showing possible colitis. Pt likely at high refeeding risk;  recommend monitor K, Mg and P labs daily.   Of note, pt with h/o B12 and vitamin D deficiency. Pt reports she takes vitamin D and B12 daily at home. Deficiency of vitamin D, niacin and B12 can cause diarrhea. If diarrhea persists, consider checking these labs. Pt also with h/o pancreatitis, biliary obstruction and cholecystectomy in 2010; unsure if this may be related to the chronic diarrhea.   Medications reviewed and include: lactobacillus, synthroid, protonix, KCl  Labs reviewed: Na 134(L), K 3.1(L), BUN 56(H), creat 1.87(H), Mg 1.7 wnl Hgb 9.5(L), Hct 31.9(L)  NUTRITION - FOCUSED PHYSICAL EXAM:    Most Recent Value  Orbital Region  No depletion  Upper Arm Region  No depletion  Thoracic and Lumbar Region  No depletion  Buccal Region  No depletion  Temple Region  No depletion  Clavicle Bone Region  No depletion  Clavicle and Acromion Bone Region  No depletion  Scapular Bone Region  No depletion  Dorsal Hand  Mild depletion  Patellar Region  Mild depletion  Anterior Thigh Region  No depletion  Posterior Calf Region  No depletion  Edema (RD Assessment)  Mild  Hair  Reviewed  Eyes  Reviewed  Mouth  Reviewed  Skin  Reviewed  Nails  Reviewed     Diet Order:   Diet Order            Diet NPO time specified Except for: Sips with Meds  Diet effective midnight        Diet  NPO time specified  Diet effective midnight        Diet NPO time specified Except for: Sips with Meds  Diet effective midnight        Diet Heart Room service appropriate? Yes; Fluid consistency: Thin  Diet effective now             EDUCATION NEEDS:   Education needs have been addressed  Skin:  Skin Assessment: Reviewed RN Assessment(ecchymosis )  Last BM:  2/6- type 6  Height:   Ht Readings from Last 1 Encounters:  09/13/18 5' (1.524 m)    Weight:   Wt Readings from Last 1 Encounters:  09/13/18 60.3 kg    Ideal Body Weight:  45.45 kg  BMI:  Body mass index is 25.96 kg/m.  Estimated  Nutritional Needs:   Kcal:  1300-1500kcal/day   Protein:  60-67g/day   Fluid:  1.1L/day   Koleen Distance MS, RD, LDN Pager #- [phone] Office#- [phone] After Hours Pager: [phone]

## 2018-09-13 NOTE — H&P (Signed)
[facility] [place] [facility] NAME: Savannah Wilson    MR#:  [id number]  DATE OF BIRTH:  [birth year]  DATE OF ADMISSION:  09/12/2018  PRIMARY CARE PHYSICIAN: [name], MD   REQUESTING/REFERRING PHYSICIAN: Cinda Quest, MD  CHIEF COMPLAINT:   Chief Complaint  Patient presents with  . Nausea  . Emesis    HISTORY OF PRESENT ILLNESS:  Savannah Wilson  is a 85 y.o. female who presents with chief complaint as above.  Patient presents to the ED for complaint of nausea vomiting and diarrhea.  This started today.  However, she was seen in our ED yesterday after a fall where she hit her head.  Repeat scan today just shows soft tissue injury comparable to yesterday's scan.  CT abdomen here in the ED questions possibility of colitis.  Patient also developed A. fib with RVR while here.  She has a known history of A. fib.  C. difficile test was negative, GI panel is pending.  Hospitalist were called for admission  PAST MEDICAL HISTORY:   Past Medical History:  Diagnosis Date  . Asthma   . Barrett esophagus   . Chronic bronchitis   . Chronic kidney disease (CKD), stage IV (severe) ([place])   . Depression   . Diverticulosis   . GERD (gastroesophageal reflux disease)   . Gout   . Hyperlipidemia   . Hypertension   . Hypothyroid   . Mitral regurgitation    a. TTE 02/2017: EF 55-60%, normal WM, calcified mitral annulus with mod regurg, moderately dilated LA, unable to estimate PASP  . Pancreatitis due to biliary obstruction March 2010   s/p ERCP sphincterotomy, cholecystectomy  . Peripheral neuralgia   . Persistent atrial fibrillation    a. diagnosed 01/24/17; CHADS2VASc => 5 (HTN, age x 2, vascular disease with PAD and aortic plaque, female) giving her an estimated annual stroke risk of 6.7%; b. successful DCCV 03/28/2017; c. on Eliquis  . Venous insufficiency      PAST SURGICAL HISTORY:   Past Surgical History:  Procedure Laterality Date  . ABDOMINAL  HYSTERECTOMY    . CARDIOVERSION N/A 03/28/2017   Procedure: CARDIOVERSION;  Surgeon: Nelva Bush, MD;  Location: [facility];  Service: Cardiovascular;  Laterality: N/A;  . CARPAL TUNNEL RELEASE  jan 2013   [name]  . CATARACT EXTRACTION     right  . CATARACT EXTRACTION  2008  . CHOLECYSTECTOMY  02/10  . CHOLECYSTECTOMY  2010  . TONSILLECTOMY    . VENTRAL HERNIA REPAIR    . VENTRAL HERNIA REPAIR  2007  . VESICOVAGINAL FISTULA CLOSURE W/ TAH       SOCIAL HISTORY:   Social History   Tobacco Use  . Smoking status: Never Smoker  . Smokeless tobacco: Never Used  Substance Use Topics  . Alcohol use: No    Comment: rare     FAMILY HISTORY:   Family History  Problem Relation Age of Onset  . Kidney failure Mother   . Hypertension Mother   . Kidney disease Mother   . Multiple myeloma Daughter   . Cancer Daughter        multiple myeloma  . Kidney disease Daughter   . Heart disease Father   . Rheumatologic disease Father   . Kidney cancer Father      DRUG ALLERGIES:   Allergies  Allergen Reactions  . Ace Inhibitors Cough  . Benicar [Olmesartan Medoxomil]     Unsure of reaction type  .  Clarithromycin Other (See Comments)    Blisters in mouth  . Clarithromycin Other (See Comments)    Unknown   . Norvasc [Amlodipine Besylate]   . Levofloxacin Rash  . Zithromax [Azithromycin Dihydrate] Rash    MEDICATIONS AT HOME:   Prior to Admission medications   Medication Sig Start Date End Date Taking? Authorizing Provider  ALPRAZolam Duanne Moron) 0.5 MG tablet Take 1 tablet (0.5 mg total) by mouth 2 (two) times daily as needed for anxiety. 02/07/18  Yes [name], MD  apixaban (ELIQUIS) 2.5 MG TABS tablet Take 1 tablet (2.5 mg total) by mouth 2 (two) times daily. 02/12/18  Yes Wellington Hampshire, MD  budesonide (PULMICORT) 0.25 MG/2ML nebulizer solution USE ONE (1) VIAL IN NEBULIZER TWO TIMES PER DAY 08/21/18  Yes [name], MD  cetirizine (ZYRTEC ALLERGY) 10 MG  tablet Take 10 mg by mouth daily.   Yes [provider]  Cholecalciferol (VITAMIN D-3) 1000 units CAPS Take 1,000 Units by mouth daily.    Yes [provider]  doxycycline (VIBRA-TABS) 100 MG tablet Take 1 tablet (100 mg total) by mouth 2 (two) times daily. 09/04/18  Yes Guse, Jacquelynn Cree, FNP  escitalopram (LEXAPRO) 10 MG tablet Take 1 tablet (10 mg total) by mouth daily. After dinner 01/29/18  Yes [name], MD  furosemide (LASIX) 40 MG tablet Take 80 mg by mouth daily.    Yes [provider]  ipratropium-albuterol (DUONEB) 0.5-2.5 (3) MG/3ML SOLN Take 3 mLs by nebulization every 6 (six) hours as needed. 08/27/18  Yes [name], MD  levothyroxine (SYNTHROID, LEVOTHROID) 100 MCG tablet TAKE 1 TABLET BY MOUTH DAILY FOR THYROID 09/10/18  Yes [name], MD  losartan (COZAAR) 50 MG tablet TAKE ONE TABLET EVERY DAY 08/09/18  Yes [name], MD  omeprazole (PRILOSEC) 40 MG capsule TAKE ONE CAPSULE BY MOUTH TWICE A DAY. BEFORE BREAKFAST AND SUPPER 03/13/18  Yes [name], MD  rOPINIRole (REQUIP) 0.25 MG tablet TAKE ONE TABLET 3 TIMES DAILY 07/26/18  Yes [name], MD  vitamin B-12 (CYANOCOBALAMIN) 1000 MCG tablet Take 1,000 mcg by mouth daily.   Yes [provider]  acetaminophen (TYLENOL) 650 MG CR tablet Take 650 mg by mouth every 8 (eight) hours as needed for pain.     [provider]  benzonatate (TESSALON) 200 MG capsule Take 1 capsule (200 mg total) by mouth 3 (three) times daily as needed for cough. 08/27/18   [name], MD  docusate sodium (COLACE) 100 MG capsule Take 1 capsule (100 mg total) by mouth 2 (two) times daily as needed for mild constipation. 08/26/18   Epifanio Lesches, MD  fluticasone (FLONASE) 50 MCG/ACT nasal spray Place 2 sprays into the nose daily. Patient taking differently: Place 2 sprays into the nose daily as needed (For allergies.).  01/30/13   [name], MD  methylPREDNISolone (MEDROL DOSEPAK) 4  MG TBPK tablet Take according to pack instructions 09/04/18   Jodelle Green, FNP  mupirocin ointment (BACTROBAN) 2 % Place 1 application into the nose 2 (two) times daily. 05/01/18   [name], MD  ondansetron (ZOFRAN ODT) 4 MG disintegrating tablet Take 1 tablet (4 mg total) by mouth every 8 (eight) hours as needed for nausea or vomiting. 09/12/18   Nena Polio, MD  PROAIR HFA 108 [phone] BASE) MCG/ACT inhaler INHALE 2 PUFFS EVERY 6 HOURS AS NEEDED FOR WHEEZING 03/13/15   [name], MD  Spacer/Aero-Holding Chambers (OPTICHAMBER ADVANTAGE) MISC 1 each by Other route once. Always uses her when you're using a metered-dose inhaler. You've aromatase medicine as much, he won't have his much side effect, but you it twice as much medicine and your lungs. 06/08/15   Ahmed Prima, MD  temazepam (RESTORIL) 15 MG capsule TAKE 1 CAPSULE BY MOUTH AT BEDTIME AS NEEDED FOR SLEEP 06/13/18   [name], MD    REVIEW OF SYSTEMS:  Review of Systems  Constitutional: Negative for chills, fever, malaise/fatigue and weight loss.  HENT: Negative for ear pain, hearing loss and tinnitus.   Eyes: Negative for blurred vision, double vision, pain and redness.  Respiratory: Negative for cough, hemoptysis and shortness of breath.   Cardiovascular: Negative for chest pain, palpitations, orthopnea and leg swelling.  Gastrointestinal: Positive for diarrhea, nausea and vomiting. Negative for abdominal pain and constipation.  Genitourinary: Negative for dysuria, frequency and hematuria.  Musculoskeletal: Negative for back pain, joint pain and neck pain.  Skin:       No acne, rash, or lesions  Neurological: Negative for dizziness, tremors, focal weakness and weakness.  Endo/Heme/Allergies: Negative for polydipsia. Does not bruise/bleed easily.  Psychiatric/Behavioral: Negative for depression. The patient is not nervous/anxious and does not have insomnia.      VITAL SIGNS:   Vitals:   09/12/18 2230 09/12/18  2245 09/12/18 2300 09/13/18 0000  BP: 132/81  (!) 127/92 119/89  Pulse:   (!) 124 (!) 118  Resp:  18 (!) 24 19  Temp:      TempSrc:      SpO2: 95%  93% 93%  Weight:      Height:       Wt Readings from Last 3 Encounters:  09/12/18 63.5 kg  09/11/18 64 kg  09/04/18 65.8 kg    PHYSICAL EXAMINATION:  Physical Exam  Vitals reviewed. Constitutional: She is oriented to person, place, and time. She appears well-developed and well-nourished. No distress.  HENT:  Head: Normocephalic and atraumatic.  Dry mucous membranes  Eyes: Pupils are equal, round, and reactive to light. Conjunctivae and EOM are normal. No scleral icterus.  Neck: Normal range of motion. Neck supple. No JVD present. No thyromegaly present.  Cardiovascular: Intact distal pulses. Exam reveals no gallop and no friction rub.  No murmur heard. Tachycardic, irregular rhythm  Respiratory: Effort normal and breath sounds normal. No respiratory distress. She has no wheezes. She has no rales.  GI: Soft. Bowel sounds are normal. She exhibits no distension. There is abdominal tenderness.  Musculoskeletal: Normal range of motion.        General: No edema.     Comments: No arthritis, no gout  Lymphadenopathy:    She has no cervical adenopathy.  Neurological: She is alert and oriented to person, place, and time. No cranial nerve deficit.  No dysarthria, no aphasia  Skin: Skin is warm and dry. No rash noted. No erythema.  Psychiatric: She has a normal mood and affect. Her behavior is normal. Judgment and thought content normal.    LABORATORY PANEL:   CBC Recent Labs  Lab 09/12/18 1915  WBC 9.2  HGB 9.8*  HCT 31.8*  PLT 557*   ------------------------------------------------------------------------------------------------------------------  Chemistries  Recent Labs  Lab 09/12/18 1915  NA 135  K 3.3*  CL 102  CO2 23  GLUCOSE 117*  BUN 59*  CREATININE 1.90*  CALCIUM 8.3*  AST 19  ALT 13  ALKPHOS 70  BILITOT  1.2   ------------------------------------------------------------------------------------------------------------------  Cardiac  Enzymes Recent Labs  Lab 09/12/18 1915  TROPONINI 0.03*   ------------------------------------------------------------------------------------------------------------------  RADIOLOGY:  Ct Abdomen Pelvis Wo Contrast  Result Date: 09/12/2018 CLINICAL DATA:  Nausea, vomiting, generalized abdominal pain EXAM: CT ABDOMEN AND PELVIS WITHOUT CONTRAST TECHNIQUE: Multidetector CT imaging of the abdomen and pelvis was performed following the standard protocol without IV contrast. COMPARISON:  08/23/2018, 06/21/2013, 09/30/2008 FINDINGS: Lower chest: Moderate right and small left pleural effusion. Right basilar compressive atelectasis. Hepatobiliary: No focal liver abnormality is seen. Status post cholecystectomy. No biliary dilatation. Pancreas: Unremarkable. No pancreatic ductal dilatation or surrounding inflammatory changes. Spleen: Normal in size without focal abnormality. Adrenals/Urinary Tract: Normal adrenal glands. 2.4 x 2.9 cm intermediate density left posterior interpolar renal mass measuring 20 Hounsfield units. Stable 12 mm exophytic right upper pole renal mass. No urolithiasis or obstructive uropathy. Normal bladder. Stomach/Bowel: Large hiatal hernia. No bowel dilatation to suggest bowel obstruction. Diverticulosis without evidence of diverticulitis. Relative bowel wall thickening throughout the colon which may be secondary to underdistention versus mild colitis. No significant pericolonic inflammatory changes. Trace pelvic free fluid. No pneumatosis, pneumoperitoneum or portal venous gas. Vascular/Lymphatic: Abdominal aortic atherosclerosis. Normal caliber abdominal aorta. No lymphadenopathy. Reproductive: Status post hysterectomy. No adnexal masses. Other: Small fat containing right periumbilical hernia. No fluid collection or hematoma. No ascites. Musculoskeletal: No  acute osseous abnormality. No aggressive osseous lesion. Degenerative disc disease with disc height loss throughout the thoracolumbar spine. Grade 1 anterolisthesis of L4 on L5 secondary to severe bilateral facet arthropathy. Bilateral foraminal stenosis at L3-4, L4-5 and L5-S1. Bilateral facet arthropathy throughout the thoracolumbar spine. IMPRESSION: 1. Relative bowel wall thickening throughout the colon without surrounding inflammatory changes which may be secondary to underdistention versus mild colitis. 2. No bowel obstruction. 3. Diverticulosis without evidence of diverticulitis. 4. Thoracolumbar spine spondylosis 5.  Aortic Atherosclerosis (ICD10-I70.0). 6. 2.4 x 2.9 cm indeterminate left posterior interpolar renal mass unchanged compared with 08/23/2018, but significantly enlarged compared with prior CT abdomen dated 06/21/2013. Recommend further characterization with a nonemergent MRI of the abdomen without and with intravenous contrast. Electronically Signed   By: Kathreen Devoid   On: 09/12/2018 23:41   Dg Chest 2 View  Result Date: 09/11/2018 CLINICAL DATA:  85 year old female who fell after becoming dizzy. EXAM: CHEST - 2 VIEW COMPARISON:  08/27/2018 and earlier. FINDINGS: Seated AP and lateral views of the chest. Increased bilateral pleural effusions since last month, small to moderate and greater on the right. No superimposed pulmonary edema. Stable superimposed cardiomegaly and mediastinal contours. Visualized tracheal air column is within normal limits. No pneumothorax. Stable visualized osseous structures. Abdominal Calcified aortic atherosclerosis. Negative visible bowel gas pattern. IMPRESSION: 1. Increased bilateral pleural effusions since last month, small to moderate in greater on the right. 2.  No acute traumatic injury identified. 3.  Aortic Atherosclerosis (ICD10-I70.0). Electronically Signed   By: Genevie Ann M.D.   On: 09/11/2018 19:17   Ct Head Wo Contrast  Result Date:  09/12/2018 CLINICAL DATA:  Fall. Headache. Nausea and vomiting. EXAM: CT HEAD WITHOUT CONTRAST TECHNIQUE: Contiguous axial images were obtained from the base of the skull through the vertex without intravenous contrast. COMPARISON:  CT head 09/11/2018. FINDINGS: Brain: No evidence for acute infarction, hemorrhage, mass lesion, hydrocephalus, or extra-axial fluid. Age related atrophy. Hypoattenuation of white matter, likely small vessel disease. No delayed intra cerebral hematoma. No concerning extra-axial hemorrhage related to the scalp hematoma nor is there contralateral visible injury. Vascular: Calcification of the cavernous internal carotid arteries consistent with cerebrovascular atherosclerotic disease.  No signs of intracranial large vessel occlusion. Skull: LEFT parietal scalp hematoma. No underlying skull fracture. Sinuses/Orbits: No acute finding. Other: None. IMPRESSION: LEFT parietal scalp hematoma without underlying skull fracture or intracranial hemorrhage. No change compared with yesterday's CT head. Electronically Signed   By: Staci Righter M.D.   On: 09/12/2018 18:36   Ct Head Wo Contrast  Result Date: 09/11/2018 CLINICAL DATA:  [address] and hit her head on the floor with a resultant posterior lump. She fell due to dizziness. EXAM: CT HEAD WITHOUT CONTRAST CT CERVICAL SPINE WITHOUT CONTRAST TECHNIQUE: Multidetector CT imaging of the head and cervical spine was performed following the standard protocol without intravenous contrast. Multiplanar CT image reconstructions of the cervical spine were also generated. COMPARISON:  Brain MR dated 06/01/2012. FINDINGS: CT HEAD FINDINGS Brain: Mild-to-moderate enlargement of the ventricles and subarachnoid spaces. Mild to moderate patchy white matter low density in both cerebral hemispheres. No intracranial hemorrhage, mass lesion or CT evidence of acute infarction. Vascular: No hyperdense vessel or unexpected calcification. Skull: Normal. Negative for fracture or  focal lesion. Sinuses/Orbits: Status post bilateral cataract extraction. Unremarkable paranasal sinuses. Other: Small left posterior scalp hematoma. CT CERVICAL SPINE FINDINGS Alignment: Mild reversal of the normal cervical lordosis. 3 mm of anterolisthesis at the C3-4 level. 2 mm of retrolisthesis at the C5-6 level. Skull base and vertebrae: No acute fracture. No primary bone lesion or focal pathologic process. Soft tissues and spinal canal: No prevertebral fluid or swelling. No visible canal hematoma. Disc levels: Multilevel degenerative changes, including facet degenerative changes. These are most pronounced on the right at the C3-4 level. The C2 and C3 facets are fused on the left. Upper chest: Large right pleural effusion and moderate-sized left pleural effusion. Other: Bilateral carotid artery calcifications. IMPRESSION: 1. Small left posterior scalp hematoma without skull fracture or intracranial hemorrhage. 2. No cervical spine fracture or traumatic subluxation. 3. Large right pleural effusion and moderate-sized left pleural effusion. 4. Mild to moderate diffuse cerebral and cerebellar atrophy. 5. Mild to moderate chronic small vessel white matter ischemic changes in both cerebral hemispheres. 6. Multilevel cervical spine degenerative changes with associated subluxations, as described above. 7. Bilateral carotid artery atheromatous calcifications. Electronically Signed   By: Claudie Revering M.D.   On: 09/11/2018 18:28   Ct Cervical Spine Wo Contrast  Result Date: 09/11/2018 CLINICAL DATA:  [address] and hit her head on the floor with a resultant posterior lump. She fell due to dizziness. EXAM: CT HEAD WITHOUT CONTRAST CT CERVICAL SPINE WITHOUT CONTRAST TECHNIQUE: Multidetector CT imaging of the head and cervical spine was performed following the standard protocol without intravenous contrast. Multiplanar CT image reconstructions of the cervical spine were also generated. COMPARISON:  Brain MR dated 06/01/2012.  FINDINGS: CT HEAD FINDINGS Brain: Mild-to-moderate enlargement of the ventricles and subarachnoid spaces. Mild to moderate patchy white matter low density in both cerebral hemispheres. No intracranial hemorrhage, mass lesion or CT evidence of acute infarction. Vascular: No hyperdense vessel or unexpected calcification. Skull: Normal. Negative for fracture or focal lesion. Sinuses/Orbits: Status post bilateral cataract extraction. Unremarkable paranasal sinuses. Other: Small left posterior scalp hematoma. CT CERVICAL SPINE FINDINGS Alignment: Mild reversal of the normal cervical lordosis. 3 mm of anterolisthesis at the C3-4 level. 2 mm of retrolisthesis at the C5-6 level. Skull base and vertebrae: No acute fracture. No primary bone lesion or focal pathologic process. Soft tissues and spinal canal: No prevertebral fluid or swelling. No visible canal hematoma. Disc levels: Multilevel degenerative changes, including facet degenerative changes.  These are most pronounced on the right at the C3-4 level. The C2 and C3 facets are fused on the left. Upper chest: Large right pleural effusion and moderate-sized left pleural effusion. Other: Bilateral carotid artery calcifications. IMPRESSION: 1. Small left posterior scalp hematoma without skull fracture or intracranial hemorrhage. 2. No cervical spine fracture or traumatic subluxation. 3. Large right pleural effusion and moderate-sized left pleural effusion. 4. Mild to moderate diffuse cerebral and cerebellar atrophy. 5. Mild to moderate chronic small vessel white matter ischemic changes in both cerebral hemispheres. 6. Multilevel cervical spine degenerative changes with associated subluxations, as described above. 7. Bilateral carotid artery atheromatous calcifications. Electronically Signed   By: Claudie Revering M.D.   On: 09/11/2018 18:28   Dg Humerus Left  Result Date: 09/11/2018 CLINICAL DATA:  Left arm pain following a fall this morning. EXAM: LEFT HUMERUS - 2+ VIEW  COMPARISON:  None. FINDINGS: An IV is demonstrated in the antecubital fossa. No fracture or dislocation is seen. IMPRESSION: No fracture or dislocation. Electronically Signed   By: Claudie Revering M.D.   On: 09/11/2018 18:20    EKG:   Orders placed or performed during the hospital encounter of 09/12/18  . ED EKG  . ED EKG  . ED EKG  . ED EKG    IMPRESSION AND PLAN:  Principal Problem:   Atrial fibrillation with RVR (HCC) -IV fluids being administered, I will give her a small dose of IV Cardizem to control her heart rate.  Audiology consult placed Active Problems:   Nausea vomiting and diarrhea -PRN antiemetics.  Patient was C. difficile negative, GI panel pending.  Possible colitis seen on CT imaging, will treat based on GI panel results   Hypertension -currently normotensive, hold antihypertensives for now   CKD (chronic kidney disease), stage IV (HCC) -at baseline, avoid nephrotoxins and monitor   Hypothyroidism -continue home dose thyroid replacement  Chart review performed and case discussed with ED provider. Labs, imaging and/or ECG reviewed by provider and discussed with patient/family. Management plans discussed with the patient and/or family.  DVT PROPHYLAXIS: Systemic anticoagulation  GI PROPHYLAXIS:  PPI   ADMISSION STATUS: Observation  CODE STATUS: DNR Code Status History    Date Active Date Inactive Code Status Order ID Comments User Context   08/24/2018 0010 08/26/2018 1742 DNR [id number]  Vaughan Basta, MD Inpatient   09/19/2017 2257 09/21/2017 1633 Full Code [id number]  Dustin Flock, MD Inpatient   04/15/2017 1525 04/19/2017 1927 Full Code [id number]  Gladstone Lighter, MD Inpatient    Questions for Most Recent Historical Code Status (Order [id number])    Question Answer Comment   In the event of cardiac or respiratory ARREST Do not call a "code blue"    In the event of cardiac or respiratory ARREST Do not perform Intubation, CPR, defibrillation or ACLS    In  the event of cardiac or respiratory ARREST Use medication by any route, position, wound care, and other measures to relive pain and suffering. May use oxygen, suction and manual treatment of airway obstruction as needed for comfort.         Advance Directive Documentation     Most Recent Value  Type of Advance Directive  Healthcare Power of Attorney, Living will  Pre-existing out of facility DNR order (yellow form or pink MOST form)  -  "MOST" Form in Place?  -      TOTAL TIME TAKING CARE OF THIS PATIENT: 40 minutes.   Ethlyn Daniels 09/13/2018, 1:02 AM  [place]  [phone]  CC: Primary care physician; [name], MD  Note:  This document was prepared using Dragon voice recognition software and may include unintentional dictation errors.

## 2018-09-13 NOTE — ED Notes (Signed)
Pt was placed on a bed pan.

## 2018-09-14 ENCOUNTER — Other Ambulatory Visit: Payer: Self-pay

## 2018-09-14 ENCOUNTER — Inpatient Hospital Stay: Payer: PPO | Admitting: Anesthesiology

## 2018-09-14 ENCOUNTER — Encounter
Admission: EM | Disposition: A | Discharge status: Home Health | Payer: Self-pay | Source: Home / Self Care | Attending: Family Medicine

## 2018-09-14 ENCOUNTER — Encounter: Payer: Self-pay | Admitting: *Deleted

## 2018-09-14 DIAGNOSIS — R0602 Shortness of breath: Secondary | ICD-10-CM

## 2018-09-14 DIAGNOSIS — R112 Nausea with vomiting, unspecified: Secondary | ICD-10-CM | POA: Diagnosis not present

## 2018-09-14 DIAGNOSIS — N184 Chronic kidney disease, stage 4 (severe): Secondary | ICD-10-CM | POA: Diagnosis not present

## 2018-09-14 DIAGNOSIS — I4891 Unspecified atrial fibrillation: Secondary | ICD-10-CM

## 2018-09-14 DIAGNOSIS — I10 Essential (primary) hypertension: Secondary | ICD-10-CM | POA: Diagnosis not present

## 2018-09-14 HISTORY — PX: CARDIOVERSION: EP1203

## 2018-09-14 LAB — CBC WITH DIFFERENTIAL/PLATELET
Abs Immature Granulocytes: 0.04 10*3/uL (ref 0.00–0.07)
Basophils Absolute: 0 10*3/uL (ref 0.0–0.1)
Basophils Relative: 0 %
Eosinophils Absolute: 0.2 10*3/uL (ref 0.0–0.5)
Eosinophils Relative: 3 %
HCT: 28.9 % — ABNORMAL LOW (ref 36.0–46.0)
Hemoglobin: 8.7 g/dL — ABNORMAL LOW (ref 12.0–15.0)
Immature Granulocytes: 1 %
Lymphocytes Relative: 16 %
Lymphs Abs: 1.2 10*3/uL (ref 0.7–4.0)
MCH: 26.9 pg (ref 26.0–34.0)
MCHC: 30.1 g/dL (ref 30.0–36.0)
MCV: 89.5 fL (ref 80.0–100.0)
Monocytes Absolute: 0.6 10*3/uL (ref 0.1–1.0)
Monocytes Relative: 8 %
Neutro Abs: 5.5 10*3/uL (ref 1.7–7.7)
Neutrophils Relative %: 72 %
Platelets: 438 10*3/uL — ABNORMAL HIGH (ref 150–400)
RBC: 3.23 MIL/uL — ABNORMAL LOW (ref 3.87–5.11)
RDW: 14.9 % (ref 11.5–15.5)
WBC: 7.6 10*3/uL (ref 4.0–10.5)
nRBC: 0 % (ref 0.0–0.2)

## 2018-09-14 LAB — BASIC METABOLIC PANEL
Anion gap: 8 (ref 5–15)
BUN: 48 mg/dL — ABNORMAL HIGH (ref 8–23)
CO2: 24 mmol/L (ref 22–32)
Calcium: 8.3 mg/dL — ABNORMAL LOW (ref 8.9–10.3)
Chloride: 105 mmol/L (ref 98–111)
Creatinine, Ser: 1.82 mg/dL — ABNORMAL HIGH (ref 0.44–1.00)
GFR calc Af Amer: 28 mL/min — ABNORMAL LOW (ref >60)
GFR, EST NON AFRICAN AMERICAN: 25 mL/min — AB (ref >60)
Glucose, Bld: 112 mg/dL — ABNORMAL HIGH (ref 70–99)
POTASSIUM: 3.7 mmol/L (ref 3.5–5.1)
Sodium: 137 mmol/L (ref 135–145)

## 2018-09-14 LAB — PHOSPHORUS: PHOSPHORUS: 3.7 mg/dL (ref 2.5–4.6)

## 2018-09-14 LAB — MAGNESIUM: Magnesium: 1.7 mg/dL (ref 1.7–2.4)

## 2018-09-14 SURGERY — CARDIOVERSION (CATH LAB)
Anesthesia: General

## 2018-09-14 MED ORDER — PROPOFOL 10 MG/ML IV BOLUS
INTRAVENOUS | Status: DC | PRN
  Administered 2018-09-14: 30 mg via INTRAVENOUS

## 2018-09-14 MED ORDER — SODIUM CHLORIDE 0.9 % IV SOLN
INTRAVENOUS | Status: DC
  Administered 2018-09-14 (×2): via INTRAVENOUS

## 2018-09-14 MED ORDER — METOCLOPRAMIDE HCL 5 MG/ML IJ SOLN
5.0000 mg | Freq: Four times a day (QID) | INTRAMUSCULAR | Status: DC | PRN
  Administered 2018-09-14: 5 mg via INTRAVENOUS
  Filled 2018-09-14: qty 2

## 2018-09-14 NOTE — Anesthesia Procedure Notes (Signed)
Date/Time: 09/14/2018 12:53 PM Performed by: Nelda Marseille, CRNA Pre-anesthesia Checklist: Patient identified, Emergency Drugs available, Suction available, Patient being monitored and Timeout performed Oxygen Delivery Method: Nasal cannula

## 2018-09-14 NOTE — Progress Notes (Signed)
Attempted report to bedside RN x 3. Unable to get RN on telephone. Message left with other floor RN will bring Pt upstairs and give bedside report.

## 2018-09-14 NOTE — Progress Notes (Signed)
Progress Note  Patient Name: Savannah Wilson Date of Encounter: 09/14/2018  Primary Cardiologist: Fletcher Anon  Subjective   No chest pain or SOB. For DCCV later today.   Inpatient Medications    Scheduled Meds: . apixaban  2.5 mg Oral BID  . budesonide  0.25 mg Nebulization BID  . diltiazem  180 mg Oral Daily  . escitalopram  10 mg Oral Daily  . feeding supplement (ENSURE ENLIVE)  237 mL Oral BID BM  . lactobacillus  1 g Oral TID WC  . levothyroxine  100 mcg Oral QAC breakfast  . multivitamin with minerals  1 tablet Oral Daily  . pantoprazole  40 mg Oral BID AC  . potassium chloride  40 mEq Oral Once  . rOPINIRole  0.25 mg Oral TID  . sodium chloride flush  3 mL Intravenous Q12H   Continuous Infusions:  PRN Meds: acetaminophen **OR** acetaminophen, ALPRAZolam, ipratropium-albuterol, loperamide, ondansetron **OR** ondansetron (ZOFRAN) IV, sodium chloride flush   Vital Signs    Vitals:   09/13/18 2025 09/14/18 0450 09/14/18 0732 09/14/18 0812  BP:  (!) 145/65 140/68   Pulse:  82 90   Resp:      Temp:  98.4 F (36.9 C) 98.1 F (36.7 C)   TempSrc:  Oral Oral   SpO2: 95% 93% 90% 90%  Weight:  61 kg    Height:        Intake/Output Summary (Last 24 hours) at 09/14/2018 0922 Last data filed at 09/14/2018 0904 Gross per 24 hour  Intake 540 ml  Output 575 ml  Net -35 ml   Filed Weights   09/12/18 1754 09/13/18 0239 09/14/18 0450  Weight: 63.5 kg 60.3 kg 61 kg    Telemetry    Afib with PVCs, ventricular rates currently in the 70s to 80s bpm with occasional episodes into the 120s bpm - Personally Reviewed  ECG    n/a - Personally Reviewed  Physical Exam   GEN: No acute distress.   Neck: No JVD. Cardiac: Irregularly irregular, no murmurs, rubs, or gallops.  Respiratory: Wheezing bilaterally.  GI: Soft, nontender, non-distended.   MS: No edema; No deformity. Neuro:  Alert and oriented x 3; Nonfocal.  Psych: Normal affect.  Labs    Chemistry Recent Labs    Lab 09/12/18 1915 09/13/18 0322 09/14/18 0412  NA 135 134* 137  K 3.3* 3.1* 3.7  CL 102 101 105  CO2 23 23 24   GLUCOSE 117* 96 112*  BUN 59* 56* 48*  CREATININE 1.90* 1.87* 1.82*  CALCIUM 8.3* 7.9* 8.3*  PROT 6.5  --   --   ALBUMIN 3.1*  --   --   AST 19  --   --   ALT 13  --   --   ALKPHOS 70  --   --   BILITOT 1.2  --   --   GFRNONAA 23* 24* 25*  GFRAA 27* 28* 28*  ANIONGAP 10 10 8      Hematology Recent Labs  Lab 09/12/18 1915 09/13/18 0322 09/14/18 0412  WBC 9.2 9.1 7.6  RBC 3.60* 3.55* 3.23*  HGB 9.8* 9.5* 8.7*  HCT 31.8* 31.9* 28.9*  MCV 88.3 89.9 89.5  MCH 27.2 26.8 26.9  MCHC 30.8 29.8* 30.1  RDW 14.9 14.9 14.9  PLT 557* 511* 438*    Cardiac Enzymes Recent Labs  Lab 09/11/18 1710 09/11/18 2022 09/12/18 1915  TROPONINI 0.03* 0.03* 0.03*   No results for input(s): TROPIPOC in the last  168 hours.   BNPNo results for input(s): BNP, PROBNP in the last 168 hours.   DDimer No results for input(s): DDIMER in the last 168 hours.   Radiology    Ct Abdomen Pelvis Wo Contrast  Result Date: 09/12/2018 IMPRESSION: 1. Relative bowel wall thickening throughout the colon without surrounding inflammatory changes which may be secondary to underdistention versus mild colitis. 2. No bowel obstruction. 3. Diverticulosis without evidence of diverticulitis. 4. Thoracolumbar spine spondylosis 5.  Aortic Atherosclerosis (ICD10-I70.0). 6. 2.4 x 2.9 cm indeterminate left posterior interpolar renal mass unchanged compared with 08/23/2018, but significantly enlarged compared with prior CT abdomen dated 06/21/2013. Recommend further characterization with a nonemergent MRI of the abdomen without and with intravenous contrast. Electronically Signed   By: Kathreen Devoid   On: 09/12/2018 23:41   Ct Head Wo Contrast  Result Date: 09/12/2018 IMPRESSION: LEFT parietal scalp hematoma without underlying skull fracture or intracranial hemorrhage. No change compared with yesterday's CT head.  Electronically Signed   By: Staci Righter M.D.   On: 09/12/2018 18:36    Cardiac Studies   Echo 08/24/2018: Study Conclusions  - Left ventricle: The cavity size was normal. There was mild   concentric hypertrophy. Systolic function was normal. The   estimated ejection fraction was in the range of 55% to 60%. Wall   motion was normal; there were no regional wall motion   abnormalities. The study is not technically sufficient to allow   evaluation of LV diastolic function. - Mitral valve: There was mild regurgitation. - Left atrium: The atrium was moderately dilated. - Right ventricle: Systolic function was normal. - Pulmonary arteries: Systolic pressure was within the normal   range.  Impressions:  - Rhythm is atrial fibrillation.  Patient Profile     85 y.o. female with history of persistent CAD medically managed, Afib on Eliquis s/p DCCV in 03/2017, HFpEF, CKD stage IV, anemia of chronic disease, asthma/COPD, hypothyroidism, Barrett's esophagus, and GAD who we are asked to see for the management of Afib.   Assessment & Plan    1. Persistent Afib with RVR: -Ventricular rates are improving -NPO -Reports compliance with Eliquis 2.5 mg bid (age and renal function dosed) -For DCCV later today with Dr. Rockey Situ (message has been sent to him) -Continue Cardizem CD 180 mg daily  2. Elevated troponin: -Minimally elevated and felt to be secondary to supply demand ischemia -No plans for inpatient ischemic evaluation   3. HFpEF: -Volume status appears at baseline, perhaps minimally volume up -May need gentle diuresis, though I suspect restoration of sinus rhythm will help with this  4. Hypokalemia: -Improving   For questions or updates, please contact [place] Please consult [email or web address] for contact info under Cardiology/STEMI.    Signed, Christell Faith, PA-C [place] Pager: [phone] 09/14/2018, 9:22 AM

## 2018-09-14 NOTE — Anesthesia Postprocedure Evaluation (Signed)
Anesthesia Post Note  Patient: Savannah Wilson  Procedure(s) Performed: CARDIOVERSION (N/A )  Patient location during evaluation: Cath Lab Anesthesia Type: General Level of consciousness: awake and alert Pain management: pain level controlled Vital Signs Assessment: post-procedure vital signs reviewed and stable Respiratory status: spontaneous breathing, nonlabored ventilation, respiratory function stable and patient connected to nasal cannula oxygen Cardiovascular status: blood pressure returned to baseline and stable Postop Assessment: no apparent nausea or vomiting Anesthetic complications: no     Last Vitals:  Vitals:   09/14/18 1340 09/14/18 1400  BP: (!) 138/52 (!) 134/47  Pulse: 64 (!) 58  Resp: 16 16  Temp:  36.6 C  SpO2: 93% 95%    Last Pain:  Vitals:   09/14/18 1400  TempSrc: Oral  PainSc: 0-No pain                 Martha Clan

## 2018-09-14 NOTE — CV Procedure (Signed)
Cardioversion procedure note For atrial fibrillation.  Procedure Details:  Consent: Risks of procedure as well as the alternatives and risks of each were explained to the (patient/caregiver). Consent for procedure obtained.  Time Out: Verified patient identification, verified procedure, site/side was marked, verified correct patient position, special equipment/implants available, medications/allergies/relevent history reviewed, required imaging and test results available. Performed  Patient placed on cardiac monitor, pulse oximetry, supplemental oxygen as necessary.  Sedation given: 30 mg propofol IV, Dr. Rosey Bath Pacer pads placed anterior and posterior chest.   Cardioverted 1 time(s).  Cardioverted at  150 J. Synchronized biphasic Converted to NSR   Evaluation: Findings: Post procedure EKG shows: NSR Complications: None Patient did tolerate procedure well.  Time Spent Directly with the Patient:  71 minutes   Esmond Plants, M.D., Ph.D.

## 2018-09-14 NOTE — Anesthesia Preprocedure Evaluation (Signed)
Anesthesia Evaluation  Patient identified by MRN, date of birth, ID band Patient awake    Reviewed: Allergy & Precautions, NPO status , Patient's Chart, lab work & pertinent test results  History of Anesthesia Complications Negative for: history of anesthetic complications  Airway Mallampati: III       Dental  (+) Dental Advidsory Given   Pulmonary shortness of breath and with exertion, asthma , neg sleep apnea, pneumonia, resolved, neg COPD, Recent URI ,           Cardiovascular hypertension, Pt. on medications and Pt. on home beta blockers (-) angina+ Peripheral Vascular Disease and +CHF  (-) CAD, (-) Past MI, (-) Cardiac Stents and (-) CABG + dysrhythmias Atrial Fibrillation (-) Valvular Problems/Murmurs     Neuro/Psych PSYCHIATRIC DISORDERS Anxiety Depression negative neurological ROS     GI/Hepatic Neg liver ROS, GERD  Medicated and Controlled,  Endo/Other  neg diabetesHypothyroidism   Renal/GU Renal InsufficiencyRenal disease     Musculoskeletal   Abdominal   Peds  Hematology  (+) Blood dyscrasia, anemia ,   Anesthesia Other Findings Past Medical History: No date: Asthma No date: Barrett esophagus No date: Chronic bronchitis No date: Chronic kidney disease (CKD), stage IV (severe) (HCC) No date: Depression No date: Diverticulosis No date: GERD (gastroesophageal reflux disease) No date: Gout No date: Hyperlipidemia No date: Hypertension No date: Hypothyroid No date: Mitral regurgitation     Comment:  a. TTE 02/2017: EF 55-60%, normal WM, calcified mitral               annulus with mod regurg, moderately dilated LA, unable to              estimate PASP March 2010: Pancreatitis due to biliary obstruction     Comment:  s/p ERCP sphincterotomy, cholecystectomy No date: Peripheral neuralgia No date: Persistent atrial fibrillation     Comment:  a. diagnosed 01/24/17; CHADS2VASc => 5 (HTN, age x 2,       vascular disease with PAD and aortic plaque, female)               giving her an estimated annual stroke risk of 6.7%; b.               successful DCCV 03/28/2017; c. on Eliquis No date: Venous insufficiency   Reproductive/Obstetrics                             Anesthesia Physical  Anesthesia Plan  ASA: III  Anesthesia Plan: General   Post-op Pain Management:    Induction: Intravenous  PONV Risk Score and Plan: TIVA and Propofol infusion  Airway Management Planned: Nasal Cannula and Natural Airway  Additional Equipment:   Intra-op Plan:   Post-operative Plan:   Informed Consent: I have reviewed the patients History and Physical, chart, labs and discussed the procedure including the risks, benefits and alternatives for the proposed anesthesia with the patient or authorized representative who has indicated his/her understanding and acceptance.       Plan Discussed with:   Anesthesia Plan Comments:         Anesthesia Quick Evaluation

## 2018-09-14 NOTE — Anesthesia Post-op Follow-up Note (Signed)
Anesthesia QCDR form completed.        

## 2018-09-14 NOTE — Progress Notes (Signed)
[place] [facility] [place] NAME: Savannah Wilson    MR#:  [id number]  DATE OF BIRTH:  [birth year]  SUBJECTIVE:  Patient is without complaint, daughter at the bedside, patient denies any further nausea/vomiting/diarrhea, status post cardioversion by cardiology this afternoon, left renal mass found on CT is a chronic issue followed by nephrology/Dr. [name]   REVIEW OF SYSTEMS:  CONSTITUTIONAL: No fever, fatigue or weakness.  EYES: No blurred or double vision.  EARS, NOSE, AND THROAT: No tinnitus or ear pain.  RESPIRATORY: No cough, shortness of breath, wheezing or hemoptysis.  CARDIOVASCULAR: No chest pain, orthopnea, edema.  GASTROINTESTINAL: No nausea, vomiting, diarrhea or abdominal pain.  GENITOURINARY: No dysuria, hematuria.  ENDOCRINE: No polyuria, nocturia,  HEMATOLOGY: No anemia, easy bruising or bleeding SKIN: No rash or lesion. MUSCULOSKELETAL: No joint pain or arthritis.   NEUROLOGIC: No tingling, numbness, weakness.  PSYCHIATRY: No anxiety or depression.   ROS  DRUG ALLERGIES:   Allergies  Allergen Reactions  . Promethazine Anxiety, Palpitations and Other (See Comments)    IV Promethazine 6.25 mg. JIttery, anxious, couldn't lay still, increase HR  . Ace Inhibitors Cough  . Benicar [Olmesartan Medoxomil]     Unsure of reaction type  . Clarithromycin Other (See Comments)    Blisters in mouth  . Clarithromycin Other (See Comments)    Unknown   . Norvasc [Amlodipine Besylate]   . Levofloxacin Rash  . Zithromax [Azithromycin Dihydrate] Rash    VITALS:  Blood pressure (!) 126/47, pulse 62, temperature 97.7 F (36.5 C), temperature source Oral, resp. rate 19, height 5' (1.524 m), weight 61 kg, SpO2 95 %.  PHYSICAL EXAMINATION:  GENERAL:  85 y.o.-year-old patient lying in the bed with no acute distress.  EYES: Pupils equal, round, reactive to light and accommodation. No scleral icterus. Extraocular muscles intact.  HEENT: Head  atraumatic, normocephalic. Oropharynx and nasopharynx clear.  NECK:  Supple, no jugular venous distention. No thyroid enlargement, no tenderness.  LUNGS: Normal breath sounds bilaterally, no wheezing, rales,rhonchi or crepitation. No use of accessory muscles of respiration.  CARDIOVASCULAR: S1, S2 normal. No murmurs, rubs, or gallops.  ABDOMEN: Soft, nontender, nondistended. Bowel sounds present. No organomegaly or mass.  EXTREMITIES: No pedal edema, cyanosis, or clubbing.  NEUROLOGIC: Cranial nerves II through XII are intact. Muscle strength 5/5 in all extremities. Sensation intact. Gait not checked.  PSYCHIATRIC: The patient is alert and oriented x 3.  SKIN: No obvious rash, lesion, or ulcer.   Physical Exam LABORATORY PANEL:   CBC Recent Labs  Lab 09/14/18 0412  WBC 7.6  HGB 8.7*  HCT 28.9*  PLT 438*   ------------------------------------------------------------------------------------------------------------------  Chemistries  Recent Labs  Lab 09/12/18 1915  09/14/18 0412  NA 135   < > 137  K 3.3*   < > 3.7  CL 102   < > 105  CO2 23   < > 24  GLUCOSE 117*   < > 112*  BUN 59*   < > 48*  CREATININE 1.90*   < > 1.82*  CALCIUM 8.3*   < > 8.3*  MG  --    < > 1.7  AST 19  --   --   ALT 13  --   --   ALKPHOS 70  --   --   BILITOT 1.2  --   --    < > = values in this interval not displayed.   ------------------------------------------------------------------------------------------------------------------  Cardiac Enzymes Recent Labs  Lab 09/11/18  2022 09/12/18 1915  TROPONINI 0.03* 0.03*   ------------------------------------------------------------------------------------------------------------------  RADIOLOGY:  Ct Abdomen Pelvis Wo Contrast  Result Date: 09/12/2018 CLINICAL DATA:  Nausea, vomiting, generalized abdominal pain EXAM: CT ABDOMEN AND PELVIS WITHOUT CONTRAST TECHNIQUE: Multidetector CT imaging of the abdomen and pelvis was performed following the  standard protocol without IV contrast. COMPARISON:  08/23/2018, 06/21/2013, 09/30/2008 FINDINGS: Lower chest: Moderate right and small left pleural effusion. Right basilar compressive atelectasis. Hepatobiliary: No focal liver abnormality is seen. Status post cholecystectomy. No biliary dilatation. Pancreas: Unremarkable. No pancreatic ductal dilatation or surrounding inflammatory changes. Spleen: Normal in size without focal abnormality. Adrenals/Urinary Tract: Normal adrenal glands. 2.4 x 2.9 cm intermediate density left posterior interpolar renal mass measuring 20 Hounsfield units. Stable 12 mm exophytic right upper pole renal mass. No urolithiasis or obstructive uropathy. Normal bladder. Stomach/Bowel: Large hiatal hernia. No bowel dilatation to suggest bowel obstruction. Diverticulosis without evidence of diverticulitis. Relative bowel wall thickening throughout the colon which may be secondary to underdistention versus mild colitis. No significant pericolonic inflammatory changes. Trace pelvic free fluid. No pneumatosis, pneumoperitoneum or portal venous gas. Vascular/Lymphatic: Abdominal aortic atherosclerosis. Normal caliber abdominal aorta. No lymphadenopathy. Reproductive: Status post hysterectomy. No adnexal masses. Other: Small fat containing right periumbilical hernia. No fluid collection or hematoma. No ascites. Musculoskeletal: No acute osseous abnormality. No aggressive osseous lesion. Degenerative disc disease with disc height loss throughout the thoracolumbar spine. Grade 1 anterolisthesis of L4 on L5 secondary to severe bilateral facet arthropathy. Bilateral foraminal stenosis at L3-4, L4-5 and L5-S1. Bilateral facet arthropathy throughout the thoracolumbar spine. IMPRESSION: 1. Relative bowel wall thickening throughout the colon without surrounding inflammatory changes which may be secondary to underdistention versus mild colitis. 2. No bowel obstruction. 3. Diverticulosis without evidence of  diverticulitis. 4. Thoracolumbar spine spondylosis 5.  Aortic Atherosclerosis (ICD10-I70.0). 6. 2.4 x 2.9 cm indeterminate left posterior interpolar renal mass unchanged compared with 08/23/2018, but significantly enlarged compared with prior CT abdomen dated 06/21/2013. Recommend further characterization with a nonemergent MRI of the abdomen without and with intravenous contrast. Electronically Signed   By: Kathreen Devoid   On: 09/12/2018 23:41   Ct Head Wo Contrast  Result Date: 09/12/2018 CLINICAL DATA:  Fall. Headache. Nausea and vomiting. EXAM: CT HEAD WITHOUT CONTRAST TECHNIQUE: Contiguous axial images were obtained from the base of the skull through the vertex without intravenous contrast. COMPARISON:  CT head 09/11/2018. FINDINGS: Brain: No evidence for acute infarction, hemorrhage, mass lesion, hydrocephalus, or extra-axial fluid. Age related atrophy. Hypoattenuation of white matter, likely small vessel disease. No delayed intra cerebral hematoma. No concerning extra-axial hemorrhage related to the scalp hematoma nor is there contralateral visible injury. Vascular: Calcification of the cavernous internal carotid arteries consistent with cerebrovascular atherosclerotic disease. No signs of intracranial large vessel occlusion. Skull: LEFT parietal scalp hematoma. No underlying skull fracture. Sinuses/Orbits: No acute finding. Other: None. IMPRESSION: LEFT parietal scalp hematoma without underlying skull fracture or intracranial hemorrhage. No change compared with yesterday's CT head. Electronically Signed   By: Staci Righter M.D.   On: 09/12/2018 18:36    ASSESSMENT AND PLAN:  *Atrial fibrillation with RVR  Resolved  Status post cardioversion by cardiology this afternoon  Await further cardiology recommendations, continue Cardizem, Eliquis, TSH was normal  *Acute nausea/emesis/diarrhea  Resolved  Suspect due to viral gastroenteritis  Antiemetics PRN, GI panel/C. difficile were negative    *Chronic hypertension  Stable on current regiment  *Chronic kidney disease stage IV at baseline avoid nephrotoxic agents   *Chronic hypothyroidism, unspecified  TSH is normal  Continue  Synthroid    Disposition Home with cardiology clearance-tentatively planned for on tomorrow   All the records are reviewed and case discussed with Care Management/Social Workerr. Management plans discussed with the patient, family and they are in agreement.  CODE STATUS: dnr  TOTAL TIME TAKING CARE OF THIS PATIENT: 35 minutes.     POSSIBLE D/C IN 1 DAYS, DEPENDING ON CLINICAL CONDITION.   Avel Peace Salary M.D on 09/14/2018   Between 7am to 6pm - Pager - [phone]  After 6pm go to [email or web address] - password [email or web address] [facility]  Office  [phone]  CC: Primary care physician; [name], MD  Note: This dictation was prepared with Dragon dictation along with smaller phrase technology. Any transcriptional errors that result from this process are unintentional.

## 2018-09-14 NOTE — Transfer of Care (Signed)
Immediate Anesthesia Transfer of Care Note  Patient: Savannah Wilson  Procedure(s) Performed: CARDIOVERSION (N/A )  Patient Location: PACU  Anesthesia Type:General  Level of Consciousness: awake and sedated  Airway & Oxygen Therapy: Patient Spontanous Breathing and Patient connected to nasal cannula oxygen  Post-op Assessment: Report given to RN and Post -op Vital signs reviewed and stable  Post vital signs: Reviewed and stable  Last Vitals:  Vitals Value Taken Time  BP    Temp    Pulse    Resp 21 09/14/2018 12:53 PM  SpO2    Vitals shown include unvalidated device data.  Last Pain:  Vitals:   09/14/18 1203  TempSrc: Oral  PainSc: 0-No pain         Complications: No apparent anesthesia complications

## 2018-09-14 NOTE — Care Management Note (Signed)
Case Management Note  Patient Details  Name: Savannah Wilson MRN: [id number] Date of Birth: [birth year]  Subjective/Objective:        Patient is from home alone.  Daughter Savannah Wilson is at bedside.  She lives 5 minutes down the road.  She fell at home.  Cardioversion today for atrial fibrillation with rvr.  She has a private caregiver that come 4 x a week.   She uses a walker.  Denies difficulties obtaining medications or with transportation.  Uses [facility]; current with PCP.  Open to Cardiac rehab here at [facility].  She declines any home health services.  Placed a [facility] referral as she is on the registry and readmission rate is 32%.  No further needs at this time from CM.          Action/Plan:  [facility] referral    Expected Discharge Date:  09/15/18               Expected Discharge Plan:  Home/Self Care  In-House Referral:  [facility]  Discharge planning Services  CM Consult  Post Acute Care Choice:    Choice offered to:     DME Arranged:    DME Agency:     HH Arranged:  Patient Refused [place] Agency:     Status of Service:  Completed, signed off  If discussed at [employer] of Stay Meetings, dates discussed:    Additional Comments:  Elza Rafter, RN 09/14/2018, 3:11 PM

## 2018-09-15 DIAGNOSIS — R112 Nausea with vomiting, unspecified: Secondary | ICD-10-CM | POA: Diagnosis not present

## 2018-09-15 DIAGNOSIS — I4891 Unspecified atrial fibrillation: Secondary | ICD-10-CM | POA: Diagnosis not present

## 2018-09-15 DIAGNOSIS — N184 Chronic kidney disease, stage 4 (severe): Secondary | ICD-10-CM | POA: Diagnosis not present

## 2018-09-15 DIAGNOSIS — I10 Essential (primary) hypertension: Secondary | ICD-10-CM | POA: Diagnosis not present

## 2018-09-15 MED ORDER — LOPERAMIDE HCL 2 MG PO CAPS: 2.0000 mg | ORAL_CAPSULE | ORAL | 0 refills | Status: DC | PRN

## 2018-09-15 MED ORDER — DILTIAZEM HCL ER COATED BEADS 180 MG PO CP24: 180.0000 mg | ORAL_CAPSULE | Freq: Every day | ORAL | 1 refills | Status: DC

## 2018-09-15 NOTE — Plan of Care (Signed)
Heart rate is stable. Patient complained of nausea. No relief with zofran but the reglan was effective. Patient is currently resting without any complications.

## 2018-09-15 NOTE — Progress Notes (Signed)
Pt to be discharged this afternoon. Iv and tele removed. disch instructions and prescrips given. Pt discharged by w.c.

## 2018-09-15 NOTE — Evaluation (Signed)
Physical Therapy Evaluation Patient Details Name: Savannah Wilson MRN: [id number] DOB: 1928-12-20 Today's Date: 09/15/2018   History of Present Illness  85 year old female with diarrhea, N/V was admitted for a-fib with RVR, cardioversion done with normal sinus rhythm now.  Cleared for enteric infection.  PMHx:  asthma, Barrett's esophagus, CKD 4, diverticulosis, gout, HLD, HTN, mitral regurg, EF 55-60%, pancreatitis, previous cardioversion  Clinical Impression  Pt was seen for mobility prior to her discharge to [facility] again, and noted her O2 sats dropped to 88% with gait on the hallway.  Recovered to 91% after a time, and informed nursing about this.  Pt has low baseline sats, and noted respiratory is seeing her for productive cough and SOB.  Pt will return to home care with HHPT to see her for monitoring of vitals, strengthening and increasing standing and gait endurance.  Follow acutely for same.    Follow Up Recommendations Home health PT    Equipment Recommendations  Rolling walker with 5" wheels    Recommendations for Other Services       Precautions / Restrictions Precautions Precautions: Fall Precaution Comments: home alone, I gait Restrictions Weight Bearing Restrictions: No      Mobility  Bed Mobility Overal bed mobility: Modified Independent                Transfers Overall transfer level: Needs assistance Equipment used: Rolling walker (2 wheeled) Transfers: Sit to/from Stand Sit to Stand: Min guard         General transfer comment: reminders for hand placement  Ambulation/Gait Ambulation/Gait assistance: Min guard(for safety) Gait Distance (Feet): 100 Feet Assistive device: Rolling walker (2 wheeled) Gait Pattern/deviations: Step-through pattern;Decreased stride length Gait velocity: reduced Gait velocity interpretation: <1.31 ft/sec, indicative of household ambulator General Gait Details: short steps with RW to support her effort, slow turns to return to  room with fatigue, and after walk was 88% O2 sat  Stairs            Wheelchair Mobility    Modified Rankin (Stroke Patients Only)       Balance Overall balance assessment: History of Falls;Needs assistance Sitting-balance support: Feet supported Sitting balance-Leahy Scale: Good     Standing balance support: Bilateral upper extremity supported;During functional activity Standing balance-Leahy Scale: Fair                               Pertinent Vitals/Pain Pain Assessment: No/denies pain    Home Living Family/patient expects to be discharged to:: Private residence Living Arrangements: Alone Available Help at Discharge: Family Type of Home: House Home Access: Stairs to enter Entrance Stairs-Rails: Right Entrance Stairs-Number of Steps: 1 step  Home Layout: One level Home Equipment: Environmental consultant - 2 wheels;Cane - quad;Shower seat;Grab bars - tub/shower;Grab bars - toilet      Prior Function Level of Independence: Independent         Comments: drove and did her own shopping and housework, has live in help coming     Hand Dominance   Dominant Hand: Right    Extremity/Trunk Assessment   Upper Extremity Assessment Upper Extremity Assessment: Overall WFL for tasks assessed    Lower Extremity Assessment Lower Extremity Assessment: Overall WFL for tasks assessed(mild strength loss on B hips and knee flexion of 4-)    Cervical / Trunk Assessment Cervical / Trunk Assessment: Kyphotic  Communication   Communication: No difficulties  Cognition Arousal/Alertness: Awake/alert Behavior During Therapy:  WFL for tasks assessed/performed Overall Cognitive Status: Within Functional Limits for tasks assessed                                        General Comments      Exercises     Assessment/Plan    PT Assessment Patient needs continued PT services  PT Problem List Decreased strength;Decreased range of motion;Decreased activity  tolerance;Decreased balance;Decreased mobility;Decreased coordination;Decreased knowledge of use of DME;Decreased safety awareness;Cardiopulmonary status limiting activity       PT Treatment Interventions DME instruction;Gait training;Functional mobility training;Therapeutic activities;Therapeutic exercise;Balance training;Neuromuscular re-education;Patient/family education;Stair training    PT Goals (Current goals can be found in the Care Plan section)  Acute Rehab PT Goals Patient Stated Goal: Return to normal exercise regimen, feel comfortable walking without AD again PT Goal Formulation: With patient Time For Goal Achievement: 09/22/18 Potential to Achieve Goals: Good    Frequency Min 2X/week   Barriers to discharge Inaccessible home environment one step to enter house    Co-evaluation               AM-PAC PT "6 Clicks" Mobility  Outcome Measure Help needed turning from your back to your side while in a flat bed without using bedrails?: A Little Help needed moving from lying on your back to sitting on the side of a flat bed without using bedrails?: A Little Help needed moving to and from a bed to a chair (including a wheelchair)?: A Little Help needed standing up from a chair using your arms (e.g., wheelchair or bedside chair)?: A Little Help needed to walk in hospital room?: A Little Help needed climbing 3-5 steps with a railing? : A Lot 6 Click Score: 17    End of Session Equipment Utilized During Treatment: Gait belt Activity Tolerance: Patient tolerated treatment well;Other (comment)(may need supplemental O2) Patient left: in bed;with call bell/phone within reach;with nursing/sitter in room Nurse Communication: Mobility status;Other (comment)(including drop in O2 sats) PT Visit Diagnosis: Unsteadiness on feet (R26.81);Muscle weakness (generalized) (M62.81)    Time: 2637-8588 PT Time Calculation (min) (ACUTE ONLY): 29 min   Charges:   PT Evaluation $PT Eval  Moderate Complexity: 1 Mod PT Treatments $Gait Training: 8-22 mins       Ramond Dial 09/15/2018, 3:53 PM  Mee Hives, PT MS Acute Rehab Dept. Number: [place] and [place]

## 2018-09-15 NOTE — Discharge Summary (Signed)
[facility] [place] NAME: Savannah Wilson    MR#:  [id number]  DATE OF BIRTH:  08/16/1928  DATE OF ADMISSION:  09/12/2018 ADMITTING PHYSICIAN: Lance Coon, MD  DATE OF DISCHARGE: 09/15/2018  PRIMARY CARE PHYSICIAN: [name], MD    ADMISSION DIAGNOSIS:  Nausea [R11.0] Atrial fibrillation with RVR (HCC) [I48.91] Nausea vomiting and diarrhea [R11.2, R19.7]  DISCHARGE DIAGNOSIS:  Principal Problem:   Atrial fibrillation with RVR ([place]) Active Problems:   CKD (chronic kidney disease), stage IV (HCC)   Hypertension   Hypothyroidism   Nausea vomiting and diarrhea   SECONDARY DIAGNOSIS:   Past Medical History:  Diagnosis Date  . Asthma   . Barrett esophagus   . Chronic bronchitis   . Chronic kidney disease (CKD), stage IV (severe) ([place])   . Depression   . Diverticulosis   . GERD (gastroesophageal reflux disease)   . Gout   . Hyperlipidemia   . Hypertension   . Hypothyroid   . Mitral regurgitation    a. TTE 02/2017: EF 55-60%, normal WM, calcified mitral annulus with mod regurg, moderately dilated LA, unable to estimate PASP  . Pancreatitis due to biliary obstruction March 2010   s/p ERCP sphincterotomy, cholecystectomy  . Peripheral neuralgia   . Persistent atrial fibrillation    a. diagnosed 01/24/17; CHADS2VASc => 5 (HTN, age x 2, vascular disease with PAD and aortic plaque, female) giving her an estimated annual stroke risk of 6.7%; b. successful DCCV 03/28/2017; c. on Eliquis  . Venous insufficiency     HOSPITAL COURSE:   85 year old female with past medical history of atrial fibrillation, CKD stage IV, hypertension, hyperlipidemia, hypothyroidism, neuropathy who presented to the hospital due to nausea vomiting and diarrhea and noted to be in atrial fibrillation with rapid ventricular response.  1.  Atrial fibrillation with rapid ventricular response- initially was on a Cardizem drip but despite that continued to have  uncontrolled heart rates.  Patient is now status post cardioversion in a normal sinus rhythm. -Continue oral Cardizem, patient is already on Eliquis and she will continue that.  She is now hemodynamically stable.  2.  Nausea vomiting diarrhea-etiology unclear.  Stool for C. difficile and comprehensive culture was negative. - Patient is tolerating p.o. well but still has some diarrhea.  She will continue some Imodium as needed.  Discussed with the patient's daughter that if diarrhea continues to be a problem to see outpatient gastroenterology.  3.  Essential hypertension-patient will continue her losartan.  4.  GERD-patient will continue omeprazole.  5.  History of restless leg syndrome-patient will continue her Requip.  6.  Depression/Anxiety-patient will continue her Lexapro.  7. Hypothyroidism - cont. Synthroid.    8. Hx of chronic diastolic CHF - pt. Will cont. Her Lasix, Losartan.   Will d/c home with [facility].   DISCHARGE CONDITIONS:   Stable.   CONSULTS OBTAINED:  Treatment Team:  Wellington Hampshire, MD  DRUG ALLERGIES:   Allergies  Allergen Reactions  . Promethazine Anxiety, Palpitations and Other (See Comments)    IV Promethazine 6.25 mg. JIttery, anxious, couldn't lay still, increase HR  . Ace Inhibitors Cough  . Benicar [Olmesartan Medoxomil]     Unsure of reaction type  . Clarithromycin Other (See Comments)    Blisters in mouth  . Clarithromycin Other (See Comments)    Unknown   . Norvasc [Amlodipine Besylate]   . Levofloxacin Rash  . Zithromax [Azithromycin Dihydrate] Rash  DISCHARGE MEDICATIONS:   Allergies as of 09/15/2018      Reactions   Promethazine Anxiety, Palpitations, Other (See Comments)   IV Promethazine 6.25 mg. JIttery, anxious, couldn't lay still, increase HR   Ace Inhibitors Cough   Benicar [olmesartan Medoxomil]    Unsure of reaction type   Clarithromycin Other (See Comments)   Blisters in mouth   Clarithromycin Other  (See Comments)   Unknown    Norvasc [amlodipine Besylate]    Levofloxacin Rash   Zithromax [azithromycin Dihydrate] Rash      Medication List    STOP taking these medications   doxycycline 100 MG tablet Commonly known as:  VIBRA-TABS   methylPREDNISolone 4 MG Tbpk tablet Commonly known as:  MEDROL DOSEPAK     TAKE these medications   acetaminophen 650 MG CR tablet Commonly known as:  TYLENOL Take 650 mg by mouth every 8 (eight) hours as needed for pain.   ALPRAZolam 0.5 MG tablet Commonly known as:  XANAX Take 1 tablet (0.5 mg total) by mouth 2 (two) times daily as needed for anxiety.   apixaban 2.5 MG Tabs tablet Commonly known as:  ELIQUIS Take 1 tablet (2.5 mg total) by mouth 2 (two) times daily.   benzonatate 200 MG capsule Commonly known as:  TESSALON Take 1 capsule (200 mg total) by mouth 3 (three) times daily as needed for cough.   budesonide 0.25 MG/2ML nebulizer solution Commonly known as:  PULMICORT USE ONE (1) VIAL IN NEBULIZER TWO TIMES PER DAY   diltiazem 180 MG 24 hr capsule Commonly known as:  CARDIZEM CD Take 1 capsule (180 mg total) by mouth daily. Start taking on:  September 16, 2018   docusate sodium 100 MG capsule Commonly known as:  COLACE Take 1 capsule (100 mg total) by mouth 2 (two) times daily as needed for mild constipation.   escitalopram 10 MG tablet Commonly known as:  LEXAPRO Take 1 tablet (10 mg total) by mouth daily. After dinner   fluticasone 50 MCG/ACT nasal spray Commonly known as:  FLONASE Place 2 sprays into the nose daily. What changed:    when to take this  reasons to take this   furosemide 40 MG tablet Commonly known as:  LASIX Take 80 mg by mouth daily.   ipratropium-albuterol 0.5-2.5 (3) MG/3ML Soln Commonly known as:  DUONEB Take 3 mLs by nebulization every 6 (six) hours as needed.   levothyroxine 100 MCG tablet Commonly known as:  SYNTHROID, LEVOTHROID TAKE 1 TABLET BY MOUTH DAILY FOR THYROID    loperamide 2 MG capsule Commonly known as:  IMODIUM Take 1 capsule (2 mg total) by mouth as needed for diarrhea or loose stools.   losartan 50 MG tablet Commonly known as:  COZAAR TAKE ONE TABLET EVERY DAY   mupirocin ointment 2 % Commonly known as:  BACTROBAN Place 1 application into the nose 2 (two) times daily.   omeprazole 40 MG capsule Commonly known as:  PRILOSEC TAKE ONE CAPSULE BY MOUTH TWICE A DAY. BEFORE BREAKFAST AND SUPPER   ondansetron 4 MG disintegrating tablet Commonly known as:  ZOFRAN ODT Take 1 tablet (4 mg total) by mouth every 8 (eight) hours as needed for nausea or vomiting.   OPTICHAMBER ADVANTAGE Misc 1 each by Other route once. Always uses her when you're using a metered-dose inhaler. You've aromatase medicine as much, he won't have his much side effect, but you it twice as much medicine and your lungs.   PROAIR HFA 108 (90  Base) MCG/ACT inhaler Generic drug:  albuterol INHALE 2 PUFFS EVERY 6 HOURS AS NEEDED FOR WHEEZING   rOPINIRole 0.25 MG tablet Commonly known as:  REQUIP TAKE ONE TABLET 3 TIMES DAILY   temazepam 15 MG capsule Commonly known as:  RESTORIL TAKE 1 CAPSULE BY MOUTH AT BEDTIME AS NEEDED FOR SLEEP   vitamin B-12 1000 MCG tablet Commonly known as:  CYANOCOBALAMIN Take 1,000 mcg by mouth daily.   Vitamin D-3 25 MCG (1000 UT) Caps Take 1,000 Units by mouth daily.   ZYRTEC ALLERGY 10 MG tablet Generic drug:  cetirizine Take 10 mg by mouth daily.         DISCHARGE INSTRUCTIONS:   DIET:  Cardiac diet  DISCHARGE CONDITION:  Stable  ACTIVITY:  Activity as tolerated  OXYGEN:  Home Oxygen: No.   Oxygen Delivery: room air  DISCHARGE LOCATION:  Home with Home Health PT, RN.    If you experience worsening of your admission symptoms, develop shortness of breath, life threatening emergency, suicidal or homicidal thoughts you must seek medical attention immediately by calling 911 or calling your MD immediately  if  symptoms less severe.  You Must read complete instructions/literature along with all the possible adverse reactions/side effects for all the Medicines you take and that have been prescribed to you. Take any new Medicines after you have completely understood and accpet all the possible adverse reactions/side effects.   Please note  You were cared for by a hospitalist during your hospital stay. If you have any questions about your discharge medications or the care you received while you were in the hospital after you are discharged, you can call the unit and asked to speak with the hospitalist on call if the hospitalist that took care of you is not available. Once you are discharged, your primary care physician will handle any further medical issues. Please note that NO REFILLS for any discharge medications will be authorized once you are discharged, as it is imperative that you return to your primary care physician (or establish a relationship with a primary care physician if you do not have one) for your aftercare needs so that they can reassess your need for medications and monitor your lab values.     Today   Still having some diarrhea, heart rates are stable.  Tolerating p.o. well.  Daughter is at bedside.  Will ambulate today and if doing well will discharge home later today.  VITAL SIGNS:  Blood pressure (!) 151/58, pulse 68, temperature 98.3 F (36.8 C), temperature source Oral, resp. rate 18, height 5' (1.524 m), weight 61.1 kg, SpO2 93 %.  I/O:    Intake/Output Summary (Last 24 hours) at 09/15/2018 1335 Last data filed at 09/15/2018 0954 Gross per 24 hour  Intake -  Output 650 ml  Net -650 ml    PHYSICAL EXAMINATION:  GENERAL:  85 y.o.-year-old patient lying in the bed with no acute distress.  EYES: Pupils equal, round, reactive to light and accommodation. No scleral icterus. Extraocular muscles intact.  HEENT: Head atraumatic, normocephalic. Oropharynx and nasopharynx clear.   NECK:  Supple, no jugular venous distention. No thyroid enlargement, no tenderness.  LUNGS: Normal breath sounds bilaterally, no wheezing, rales,rhonchi. No use of accessory muscles of respiration.  CARDIOVASCULAR: S1, S2 normal. No murmurs, rubs, or gallops.  ABDOMEN: Soft, non-tender, non-distended. Bowel sounds present. No organomegaly or mass.  EXTREMITIES: No pedal edema, cyanosis, or clubbing.  NEUROLOGIC: Cranial nerves II through XII are intact. No focal motor or  sensory defecits b/l. Globally weak.  PSYCHIATRIC: The patient is alert and oriented x 3.  SKIN: No obvious rash, lesion, or ulcer.   DATA REVIEW:   CBC Recent Labs  Lab 09/14/18 0412  WBC 7.6  HGB 8.7*  HCT 28.9*  PLT 438*    Chemistries  Recent Labs  Lab 09/12/18 1915  09/14/18 0412  NA 135   < > 137  K 3.3*   < > 3.7  CL 102   < > 105  CO2 23   < > 24  GLUCOSE 117*   < > 112*  BUN 59*   < > 48*  CREATININE 1.90*   < > 1.82*  CALCIUM 8.3*   < > 8.3*  MG  --    < > 1.7  AST 19  --   --   ALT 13  --   --   ALKPHOS 70  --   --   BILITOT 1.2  --   --    < > = values in this interval not displayed.    Cardiac Enzymes Recent Labs  Lab 09/12/18 1915  TROPONINI 0.03*    Microbiology Results  Results for orders placed or performed during the hospital encounter of 09/12/18  Gastrointestinal Panel by PCR , Stool     Status: None   Collection Time: 09/12/18  7:15 PM  Result Value Ref Range Status   Campylobacter species NOT DETECTED NOT DETECTED Final   Plesimonas shigelloides NOT DETECTED NOT DETECTED Final   Salmonella species NOT DETECTED NOT DETECTED Final   Yersinia enterocolitica NOT DETECTED NOT DETECTED Final   Vibrio species NOT DETECTED NOT DETECTED Final   Vibrio cholerae NOT DETECTED NOT DETECTED Final   Enteroaggregative E coli (EAEC) NOT DETECTED NOT DETECTED Final   Enteropathogenic E coli (EPEC) NOT DETECTED NOT DETECTED Final   Enterotoxigenic E coli (ETEC) NOT DETECTED NOT  DETECTED Final   Shiga like toxin producing E coli (STEC) NOT DETECTED NOT DETECTED Final   Shigella/Enteroinvasive E coli (EIEC) NOT DETECTED NOT DETECTED Final   Cryptosporidium NOT DETECTED NOT DETECTED Final   Cyclospora cayetanensis NOT DETECTED NOT DETECTED Final   Entamoeba histolytica NOT DETECTED NOT DETECTED Final   Giardia lamblia NOT DETECTED NOT DETECTED Final   Adenovirus F40/41 NOT DETECTED NOT DETECTED Final   Astrovirus NOT DETECTED NOT DETECTED Final   Norovirus GI/GII NOT DETECTED NOT DETECTED Final   Rotavirus A NOT DETECTED NOT DETECTED Final   Sapovirus (I, II, IV, and V) NOT DETECTED NOT DETECTED Final    Comment: Performed at [facility], [address] [place], [place] [address]  C difficile quick scan w PCR reflex     Status: None   Collection Time: 09/12/18  7:15 PM  Result Value Ref Range Status   C Diff antigen NEGATIVE NEGATIVE Final   C Diff toxin NEGATIVE NEGATIVE Final   C Diff interpretation No C. difficile detected.  Final    Comment: Performed at [facility], [address] [place], [place] [address]    RADIOLOGY:  No results found.    Management plans discussed with the patient, family and they are in agreement.  CODE STATUS:     Code Status Orders  (From admission, onward)         Start     Ordered   09/13/18 0211  Do not attempt resuscitation (DNR)  Continuous    Question Answer Comment  In the event of cardiac  or respiratory ARREST Do not call a "code blue"   In the event of cardiac or respiratory ARREST Do not perform Intubation, CPR, defibrillation or ACLS   In the event of cardiac or respiratory ARREST Use medication by any route, position, wound care, and other measures to relive pain and suffering. May use oxygen, suction and manual treatment of airway obstruction as needed for comfort.      09/13/18 0211         TOTAL TIME TAKING CARE OF THIS PATIENT: 40 minutes.    [name] M.D on  09/15/2018 at 1:35 PM  Between 7am to 6pm - Pager - [phone]  After 6pm go to [email or web address] - Proofreader  [facility]  Office  [phone]  CC: Primary care physician; [name], MD

## 2018-09-15 NOTE — Progress Notes (Signed)
Progress Note  Patient Name: Savannah Wilson Date of Encounter: 09/15/2018  Primary Cardiologist: Kathlyn Sacramento, MD   Subjective   No chest pain or sob. Dyspnea improved.  Inpatient Medications    Scheduled Meds: . apixaban  2.5 mg Oral BID  . budesonide  0.25 mg Nebulization BID  . diltiazem  180 mg Oral Daily  . escitalopram  10 mg Oral Daily  . feeding supplement (ENSURE ENLIVE)  237 mL Oral BID BM  . lactobacillus  1 g Oral TID WC  . levothyroxine  100 mcg Oral QAC breakfast  . multivitamin with minerals  1 tablet Oral Daily  . pantoprazole  40 mg Oral BID AC  . rOPINIRole  0.25 mg Oral TID  . sodium chloride flush  3 mL Intravenous Q12H   Continuous Infusions: . sodium chloride 10 mL/hr at 09/14/18 1224   PRN Meds: acetaminophen **OR** acetaminophen, ALPRAZolam, ipratropium-albuterol, loperamide, metoCLOPramide (REGLAN) injection, ondansetron **OR** ondansetron (ZOFRAN) IV, sodium chloride flush   Vital Signs    Vitals:   09/14/18 1717 09/14/18 2015 09/15/18 0347 09/15/18 0802  BP: (!) 151/67 (!) 155/61 (!) 151/59 (!) 151/58  Pulse: 60 63 65 68  Resp:   (!) 24 18  Temp: 97.9 F (36.6 C) 98.1 F (36.7 C) 98.4 F (36.9 C) 98.3 F (36.8 C)  TempSrc: Oral Oral Oral Oral  SpO2: 91% 92% 90% 93%  Weight:   61.1 kg   Height:        Intake/Output Summary (Last 24 hours) at 09/15/2018 1157 Last data filed at 09/15/2018 0954 Gross per 24 hour  Intake 200 ml  Output 650 ml  Net -450 ml   Filed Weights   09/14/18 0450 09/14/18 1203 09/15/18 0347  Weight: 61 kg 61 kg 61.1 kg    Telemetry    nsr - Personally Reviewed  ECG    nsr - Personally Reviewed  Physical Exam   GEN: Obese elderly woman, no acute distress.   Neck: 7 cm JVD Cardiac: RRR, no murmurs, rubs, or gallops.  Respiratory: Clear to auscultation bilaterally. GI: Soft, nontender, non-distended  MS: No edema; No deformity. Neuro:  Nonfocal  Psych: Normal affect   Labs     Chemistry Recent Labs  Lab 09/12/18 1915 09/13/18 0322 09/14/18 0412  NA 135 134* 137  K 3.3* 3.1* 3.7  CL 102 101 105  CO2 23 23 24   GLUCOSE 117* 96 112*  BUN 59* 56* 48*  CREATININE 1.90* 1.87* 1.82*  CALCIUM 8.3* 7.9* 8.3*  PROT 6.5  --   --   ALBUMIN 3.1*  --   --   AST 19  --   --   ALT 13  --   --   ALKPHOS 70  --   --   BILITOT 1.2  --   --   GFRNONAA 23* 24* 25*  GFRAA 27* 28* 28*  ANIONGAP 10 10 8      Hematology Recent Labs  Lab 09/12/18 1915 09/13/18 0322 09/14/18 0412  WBC 9.2 9.1 7.6  RBC 3.60* 3.55* 3.23*  HGB 9.8* 9.5* 8.7*  HCT 31.8* 31.9* 28.9*  MCV 88.3 89.9 89.5  MCH 27.2 26.8 26.9  MCHC 30.8 29.8* 30.1  RDW 14.9 14.9 14.9  PLT 557* 511* 438*    Cardiac Enzymes Recent Labs  Lab 09/11/18 1710 09/11/18 2022 09/12/18 1915  TROPONINI 0.03* 0.03* 0.03*   No results for input(s): TROPIPOC in the last 168 hours.   BNPNo results for input(s):  BNP, PROBNP in the last 168 hours.   DDimer No results for input(s): DDIMER in the last 168 hours.   Radiology    No results found.  Cardiac Studies   none  Patient Profile     85 y.o. female admitted with acute on chronic diastolic heart failure and atrial fib with a RVR, s/p DCCV.  Assessment & Plan    1. Atrial fib - she is maintaining NSR s/p DCCV. She will need to be discharged on her current meds. 2. Acute on chronic diastolic heart failure - she is better today and her weight is down 6 lbs. She has been on lasix 80 mg daily in the past and I would consider discharging on this dose.     For questions or updates, please contact [place] Please consult [email or web address] for contact info under Cardiology/STEMI.      Signed, Cristopher Peru, MD  09/15/2018, 11:57 AM  Patient ID: Savannah Wilson, female   DOB: 1929-04-17, 85 y.o.   MRN: [id number]

## 2018-09-16 ENCOUNTER — Encounter: Payer: Self-pay | Admitting: Cardiovascular Disease

## 2018-09-17 ENCOUNTER — Telehealth: Payer: Self-pay | Admitting: Internal Medicine

## 2018-09-17 ENCOUNTER — Encounter: Payer: Self-pay | Admitting: *Deleted

## 2018-09-17 ENCOUNTER — Ambulatory Visit: Payer: Self-pay | Admitting: *Deleted

## 2018-09-17 ENCOUNTER — Other Ambulatory Visit: Payer: Self-pay | Admitting: *Deleted

## 2018-09-17 NOTE — Telephone Encounter (Signed)
Patient scheduled see TCM note for 09/18/18

## 2018-09-17 NOTE — Telephone Encounter (Signed)
Tuesday at 12:30

## 2018-09-17 NOTE — Telephone Encounter (Signed)
Transition Care Management Follow-up Telephone Call  How have you been since you were released from the hospital? Patient daughter says patient is down to 134 and she is still having nausea and diarrhea, having diarrhea X 2 one month , then patient had fall two weeks ago and nausea started then, patient is currently still having nausea and diarrhea today worse than yesterday, patient daughter [name] )  Broke down on phone "stating several CT scans have been done and they cannot find what is wrong."  Patient taking Zofran for nausea and Diltiazem only new medication added.   Do you understand why you were in the hospital? yes   Do you understand the discharge instrcutions? yes  Items Reviewed:  Medications reviewed: yes  Allergies reviewed: yes  Dietary changes reviewed: yes  Referrals reviewed: yes   Functional Questionnaire:   Activities of Daily Living (ADLs):   She states they are independent in the following: ambulation and feeding, and continence.  States they require assistance with the following: bathing and hygiene, grooming and dressing   Any transportation issues/concerns?: no   Any patient concerns? Yes, continued nausea and diarrhea   Confirmed importance and date/time of follow-up visits scheduled: yes   Confirmed with patient if condition begins to worsen call PCP or go to the ER.  Patient was given the Call-a-Nurse line [phone]: yes

## 2018-09-17 NOTE — Telephone Encounter (Signed)
Copied from [place] [id number]. Topic: Appointment Scheduling - Scheduling Inquiry for Clinic >> Sep 17, 2018  3:13 PM Blase Mess A wrote: CRM for notification. See Telephone encounter for: 09/17/18.  Requesting a hospital follow up Please advise  Dollene Primrose, Patient's daughter is calling regarding her mother. The patient has been nausea and diarrhea for 3-4 weeks.  Discharged from the hospital 09/15/18. And nausea and diarrhea is continuing. Hospital advised a GI referral. Please advise

## 2018-09-17 NOTE — Patient Outreach (Signed)
[facility]) [place] Telephone Outreach PCP completes Transition of Care follow up post-hospital discharge Post-hospital discharge day # 2 Unsuccessful Outreach attempt # 1- new patient  09/17/2018  Savannah Wilson July 22, 1929 [id number]  3:15 pm: Unsuccessful telephone outreach x 2 to [name], 85 y/o female referred to [place] by inpatient RN CM after recent hospitalization February 5-6, 2020 for nausea/ vomiting/ diarrhea/ colitis; during hospitalization, patient had episode of atrial fibrillation with RVR and has cardioversion on September 14, 2018.  Patient was discharged home to self-care after refusing home health services.  Patient has history including, but not limited to, A-fib- on ACT; HTN/ HLD; CKD-stage IV; hypothyroidism; GERD; dCHF; and depression.  Attempted initial phone call to patient, who apparently answered phone, but stated that she was not able to talk, as she "is feeling too sick;" patient stated that her daughter was on her way over to her home; and patient hung phone up.  I then attempted second phone call to her daughter [name], noted by review of EMR to be on [facility] DPR; HIPAA compliant voice mail message left for patient's caregiver/ daughter, requesting return call back.  Plan:  Will place [facility] CM unsuccessful patient outreach letter in mail requesting call back in writing  Will re-attempt [place] telephone outreach later this week if I do not hear back from patient or her caregiver first.  Oneta Rack, RN, BSN, [employer] Coordinator [facility] Care Management  [phone]

## 2018-09-17 NOTE — Telephone Encounter (Signed)
Message from Luciana Axe sent at 09/17/2018 3:12 PM EST   Savannah Wilson, Patient's daughter is calling regarding her mother. The patient has been nausea and diarrhea for 3-4 weeks. Discharged from the hospital 09/15/18. And nausea and diarrhea is continuing. Wanting some advise. Requesting a call back between 5:30p-7:00p when home with the patient    Pt's daughter is not currently with the pt to discuss symptoms. Pt has an appt scheduled for 09/18/18. Pt has been experiencing diarrhea for the past 3-4 weeks. Pt was hospitalized and discharged on 09/15/18 and is still experiencing diarrhea and nausea.

## 2018-09-17 NOTE — Telephone Encounter (Signed)
Scheduled

## 2018-09-17 NOTE — Telephone Encounter (Signed)
DUPLICATE

## 2018-09-18 ENCOUNTER — Encounter: Payer: Self-pay | Admitting: Internal Medicine

## 2018-09-18 ENCOUNTER — Ambulatory Visit (INDEPENDENT_AMBULATORY_CARE_PROVIDER_SITE_OTHER): Payer: PPO | Admitting: Internal Medicine

## 2018-09-18 DIAGNOSIS — Z09 Encounter for follow-up examination after completed treatment for conditions other than malignant neoplasm: Secondary | ICD-10-CM

## 2018-09-18 DIAGNOSIS — R112 Nausea with vomiting, unspecified: Secondary | ICD-10-CM

## 2018-09-18 DIAGNOSIS — R197 Diarrhea, unspecified: Secondary | ICD-10-CM | POA: Diagnosis not present

## 2018-09-18 DIAGNOSIS — I4891 Unspecified atrial fibrillation: Secondary | ICD-10-CM

## 2018-09-18 MED ORDER — BUDESONIDE-FORMOTEROL FUMARATE 80-4.5 MCG/ACT IN AERO: 2.0000 | INHALATION_SPRAY | Freq: Two times a day (BID) | RESPIRATORY_TRACT | 3 refills | Status: DC

## 2018-09-18 MED ORDER — BUDESONIDE 3 MG PO CPEP: 3.0000 mg | ORAL_CAPSULE | Freq: Every day | ORAL | 0 refills | Status: AC

## 2018-09-18 NOTE — Assessment & Plan Note (Signed)
Patient is stable post discharge and has no new issues or questions about discharge plans at the visit today for hospital follow up. All labs , imaging studies and progress notes from admission were reviewed with patient today   

## 2018-09-18 NOTE — Patient Instructions (Signed)
I am starting you on a medication for inflammatory colitis, since your other tests were negative for infection.  It is called budesonide,  And it is a steroid that is absorbed in the small intestine   Simplify your diet for the next 2-3 days  No milk products ,   No fat ,  No vegetables unless they are cooked carrots or potatoes  Toast,  Bananas,  Ok  Drink as many of the Ensure clears as you like,  They have ore protein and less salt than beef broth.    Suspend your temazepam if you are sleepy

## 2018-09-18 NOTE — Assessment & Plan Note (Signed)
Likely secondary to dehydration . Continue cardizem.

## 2018-09-18 NOTE — Progress Notes (Addendum)
Subjective:  Patient ID: Savannah Wilson, female    DOB: [birth year]  Age: 85 y.o. MRN: [id number]  CC: Diagnoses of Nausea vomiting and diarrhea, Atrial fibrillation with RVR ([place]), and Hospital discharge follow-up were pertinent to this visit.  HPI Savannah Wilson presents for hospital follow up.  Patient was admitted on  Feb 5   To   [facility] with  recurrent N/V/D that started on the day of admission.  She had been treated in the ER on the previous day for a fall that resulted in blunt head trauma.  CT head was negative on Feb 4 and repeated on Feb 5   . Lactic acid level was normal on Feb 5.   She was noted to be in rapid atrial fibrillation which ultimately  required cardioversion to resolved. Infectious diarrhea panels   And CT scan  were done,  changes suggestive of colitis without diverticulitis were noted.   ,  panels incl  c dif were negative. Marland Kitchen  She was discharged to home on February 8 ..  The following medications were newly prescribed:    cardizem au  Patient is lethargic and miserable. She has not been able to tolerate solid food for the last 24 hours .  Tolerated bagel and bacon on Sunday, was not nauseated,  Even ate  1/4 of barbecue  Sandwhich.   Monday woke up feeling fine,  But  1 hour after eggs and bacon became nauseated and started vomiting .  last emesis was yesterday at lunch.  Has had nothing but beef broth and a cracker today.  BMs were watery yesterday but had more substance today   All labs , imaging studies and progress notes from admission were reviewed with patient and daughter today.   Outpatient Medications Prior to Visit  Medication Sig Dispense Refill  . acetaminophen (TYLENOL) 650 MG CR tablet Take 650 mg by mouth every 8 (eight) hours as needed for pain.     Marland Kitchen ALPRAZolam (XANAX) 0.5 MG tablet Take 1 tablet (0.5 mg total) by mouth 2 (two) times daily as needed for anxiety. 45 tablet 5  . apixaban (ELIQUIS) 2.5 MG TABS tablet Take 1 tablet (2.5 mg total) by  mouth 2 (two) times daily. 180 tablet 3  . benzonatate (TESSALON) 200 MG capsule Take 1 capsule (200 mg total) by mouth 3 (three) times daily as needed for cough. 60 capsule 1  . budesonide (PULMICORT) 0.25 MG/2ML nebulizer solution USE ONE (1) VIAL IN NEBULIZER TWO TIMES PER DAY 120 mL 0  . cetirizine (ZYRTEC ALLERGY) 10 MG tablet Take 10 mg by mouth daily.    . Cholecalciferol (VITAMIN D-3) 1000 units CAPS Take 1,000 Units by mouth daily.     Marland Kitchen diltiazem (CARDIZEM CD) 180 MG 24 hr capsule Take 1 capsule (180 mg total) by mouth daily. 30 capsule 1  . docusate sodium (COLACE) 100 MG capsule Take 1 capsule (100 mg total) by mouth 2 (two) times daily as needed for mild constipation. 10 capsule 0  . escitalopram (LEXAPRO) 10 MG tablet Take 1 tablet (10 mg total) by mouth daily. After dinner 90 tablet 1  . fluticasone (FLONASE) 50 MCG/ACT nasal spray Place 2 sprays into the nose daily. (Patient taking differently: Place 2 sprays into the nose daily as needed (For allergies.). ) 16 g 6  . furosemide (LASIX) 40 MG tablet Take 80 mg by mouth daily.     Marland Kitchen ipratropium-albuterol (DUONEB) 0.5-2.5 (3) MG/3ML SOLN Take 3  mLs by nebulization every 6 (six) hours as needed. 360 mL 0  . levothyroxine (SYNTHROID, LEVOTHROID) 100 MCG tablet TAKE 1 TABLET BY MOUTH DAILY FOR THYROID 90 tablet 0  . loperamide (IMODIUM) 2 MG capsule Take 1 capsule (2 mg total) by mouth as needed for diarrhea or loose stools. 30 capsule 0  . losartan (COZAAR) 50 MG tablet TAKE ONE TABLET EVERY DAY 90 tablet 0  . mupirocin ointment (BACTROBAN) 2 % Place 1 application into the nose 2 (two) times daily. 22 g 0  . omeprazole (PRILOSEC) 40 MG capsule TAKE ONE CAPSULE BY MOUTH TWICE A DAY. BEFORE BREAKFAST AND SUPPER 180 capsule 1  . ondansetron (ZOFRAN ODT) 4 MG disintegrating tablet Take 1 tablet (4 mg total) by mouth every 8 (eight) hours as needed for nausea or vomiting. 12 tablet 0  . PROAIR HFA 108 (90 BASE) MCG/ACT inhaler INHALE 2 PUFFS  EVERY 6 HOURS AS NEEDED FOR WHEEZING 8.5 g 3  . rOPINIRole (REQUIP) 0.25 MG tablet TAKE ONE TABLET 3 TIMES DAILY 90 tablet 0  . Spacer/Aero-Holding Chambers (OPTICHAMBER ADVANTAGE) MISC 1 each by Other route once. Always uses her when you're using a metered-dose inhaler. You've aromatase medicine as much, he won't have his much side effect, but you it twice as much medicine and your lungs. 1 each 0  . temazepam (RESTORIL) 15 MG capsule TAKE 1 CAPSULE BY MOUTH AT BEDTIME AS NEEDED FOR SLEEP 30 capsule 5  . vitamin B-12 (CYANOCOBALAMIN) 1000 MCG tablet Take 1,000 mcg by mouth daily.     No facility-administered medications prior to visit.     Review of Systems;  Patient denies headache, fevers,  skin rash, eye pain, sinus congestion and sinus pain, sore throat, dysphagia,  hemoptysis , cough, dyspnea, wheezing, chest pain,  orthopnea, edema, abdominal pain, , melena, constipation, flank pain, dysuria, hematuria, urinary  Frequency, nocturia, numbness, tingling, seizures,  Focal weakness, Loss of consciousness,  Tremor, insomnia, depression, anxiety, and suicidal ideation.      Objective:  BP 102/60 (BP Location: Right Arm, Patient Position: Sitting, Cuff Size: Normal)   Pulse (!) 54   Temp (!) 97.4 F (36.3 C)   Wt 132 lb (59.9 kg)   SpO2 94%   BMI 25.78 kg/m   BP Readings from Last 3 Encounters:  09/18/18 102/60  09/15/18 (!) 151/58  09/11/18 (!) 160/90    Wt Readings from Last 3 Encounters:  09/18/18 132 lb (59.9 kg)  09/15/18 134 lb 12.8 oz (61.1 kg)  09/11/18 141 lb (64 kg)    General appearance: alert, cooperative and appears stated age Ears: normal TM's and external ear canals both ears Throat: lips, mucosa, and tongue normal; teeth and gums normal Neck: no adenopathy, no carotid bruit, supple, symmetrical, trachea midline and thyroid not enlarged, symmetric, no tenderness/mass/nodules Back: symmetric, no curvature. ROM normal. No CVA tenderness. Lungs: clear to  auscultation bilaterally Heart: regular rate and rhythm, S1, S2 normal, no murmur, click, rub or gallop Abdomen: soft, non-tender; bowel sounds normal; no masses,  no organomegaly Pulses: 2+ and symmetric Skin: Skin color, texture, turgor normal. No rashes or lesions Lymph nodes: Cervical, supraclavicular, and axillary nodes normal.  Lab Results  Component Value Date   HGBA1C 5.4 08/24/2018   HGBA1C 5.0 06/16/2016    Lab Results  Component Value Date   CREATININE 1.82 (H) 09/14/2018   CREATININE 1.87 (H) 09/13/2018   CREATININE 1.90 (H) 09/12/2018    Lab Results  Component Value Date  WBC 7.6 09/14/2018   HGB 8.7 (L) 09/14/2018   HCT 28.9 (L) 09/14/2018   PLT 438 (H) 09/14/2018   GLUCOSE 112 (H) 09/14/2018   CHOL 84 08/24/2018   TRIG 60 08/24/2018   HDL 27 (L) 08/24/2018   LDLCALC 45 08/24/2018   ALT 13 09/12/2018   AST 19 09/12/2018   NA 137 09/14/2018   K 3.7 09/14/2018   CL 105 09/14/2018   CREATININE 1.82 (H) 09/14/2018   BUN 48 (H) 09/14/2018   CO2 24 09/14/2018   TSH 0.478 09/13/2018   INR 1.48 04/18/2017   HGBA1C 5.4 08/24/2018    Ct Abdomen Pelvis Wo Contrast  Result Date: 09/12/2018 CLINICAL DATA:  Nausea, vomiting, generalized abdominal pain EXAM: CT ABDOMEN AND PELVIS WITHOUT CONTRAST TECHNIQUE: Multidetector CT imaging of the abdomen and pelvis was performed following the standard protocol without IV contrast. COMPARISON:  08/23/2018, 06/21/2013, 09/30/2008 FINDINGS: Lower chest: Moderate right and small left pleural effusion. Right basilar compressive atelectasis. Hepatobiliary: No focal liver abnormality is seen. Status post cholecystectomy. No biliary dilatation. Pancreas: Unremarkable. No pancreatic ductal dilatation or surrounding inflammatory changes. Spleen: Normal in size without focal abnormality. Adrenals/Urinary Tract: Normal adrenal glands. 2.4 x 2.9 cm intermediate density left posterior interpolar renal mass measuring 20 Hounsfield units. Stable  12 mm exophytic right upper pole renal mass. No urolithiasis or obstructive uropathy. Normal bladder. Stomach/Bowel: Large hiatal hernia. No bowel dilatation to suggest bowel obstruction. Diverticulosis without evidence of diverticulitis. Relative bowel wall thickening throughout the colon which may be secondary to underdistention versus mild colitis. No significant pericolonic inflammatory changes. Trace pelvic free fluid. No pneumatosis, pneumoperitoneum or portal venous gas. Vascular/Lymphatic: Abdominal aortic atherosclerosis. Normal caliber abdominal aorta. No lymphadenopathy. Reproductive: Status post hysterectomy. No adnexal masses. Other: Small fat containing right periumbilical hernia. No fluid collection or hematoma. No ascites. Musculoskeletal: No acute osseous abnormality. No aggressive osseous lesion. Degenerative disc disease with disc height loss throughout the thoracolumbar spine. Grade 1 anterolisthesis of L4 on L5 secondary to severe bilateral facet arthropathy. Bilateral foraminal stenosis at L3-4, L4-5 and L5-S1. Bilateral facet arthropathy throughout the thoracolumbar spine. IMPRESSION: 1. Relative bowel wall thickening throughout the colon without surrounding inflammatory changes which may be secondary to underdistention versus mild colitis. 2. No bowel obstruction. 3. Diverticulosis without evidence of diverticulitis. 4. Thoracolumbar spine spondylosis 5.  Aortic Atherosclerosis (ICD10-I70.0). 6. 2.4 x 2.9 cm indeterminate left posterior interpolar renal mass unchanged compared with 08/23/2018, but significantly enlarged compared with prior CT abdomen dated 06/21/2013. Recommend further characterization with a nonemergent MRI of the abdomen without and with intravenous contrast. Electronically Signed   By: Kathreen Devoid   On: 09/12/2018 23:41   Ct Head Wo Contrast  Result Date: 09/12/2018 CLINICAL DATA:  Fall. Headache. Nausea and vomiting. EXAM: CT HEAD WITHOUT CONTRAST TECHNIQUE:  Contiguous axial images were obtained from the base of the skull through the vertex without intravenous contrast. COMPARISON:  CT head 09/11/2018. FINDINGS: Brain: No evidence for acute infarction, hemorrhage, mass lesion, hydrocephalus, or extra-axial fluid. Age related atrophy. Hypoattenuation of white matter, likely small vessel disease. No delayed intra cerebral hematoma. No concerning extra-axial hemorrhage related to the scalp hematoma nor is there contralateral visible injury. Vascular: Calcification of the cavernous internal carotid arteries consistent with cerebrovascular atherosclerotic disease. No signs of intracranial large vessel occlusion. Skull: LEFT parietal scalp hematoma. No underlying skull fracture. Sinuses/Orbits: No acute finding. Other: None. IMPRESSION: LEFT parietal scalp hematoma without underlying skull fracture or intracranial hemorrhage. No change compared with  yesterday's CT head. Electronically Signed   By: Staci Righter M.D.   On: 09/12/2018 18:36    Assessment & Plan:   Problem List Items Addressed This Visit    Nausea vomiting and diarrhea    Recurrent for the past several weeks, with a 10 lb weight loss noted   She had  no  Infectious source identified during hospitalization , and no diverticulitis noted on  CT,  But suggestive of  colitis.  Trial of budesonide 3 mg.  Dietary modification recommended,       Relevant Medications   budesonide (ENTOCORT EC) 3 MG 24 hr capsule   Hospital discharge follow-up    Patient is stable post discharge and has no new issues or questions about discharge plans at the visit today for hospital follow up. All labs , imaging studies and progress notes from admission were reviewed with patient today        Atrial fibrillation with RVR ([place])    Likely secondary to dehydration . Continue cardizem.          I have discontinued Syniyah Bourne. [place]'s budesonide-formoterol. I am also having her start on budesonide. Additionally, I am  having her maintain her acetaminophen, fluticasone, PROAIR HFA, OPTICHAMBER ADVANTAGE, Vitamin D-3, vitamin B-12, furosemide, escitalopram, ALPRAZolam, apixaban, omeprazole, cetirizine, mupirocin ointment, temazepam, rOPINIRole, losartan, budesonide, docusate sodium, ipratropium-albuterol, benzonatate, levothyroxine, ondansetron, loperamide, and diltiazem.  Meds ordered this encounter  Medications  . DISCONTD: budesonide-formoterol (SYMBICORT) 80-4.5 MCG/ACT inhaler    Sig: Inhale 2 puffs into the lungs 2 (two) times daily.    Dispense:  1 Inhaler    Refill:  3  . budesonide (ENTOCORT EC) 3 MG 24 hr capsule    Sig: Take 1 capsule (3 mg total) by mouth daily.    Dispense:  30 capsule    Refill:  0    Medications Discontinued During This Encounter  Medication Reason  . budesonide-formoterol (SYMBICORT) 80-4.5 MCG/ACT inhaler     Follow-up: No follow-ups on file.   [name], MD

## 2018-09-18 NOTE — Assessment & Plan Note (Addendum)
Recurrent for the past several weeks, with a 10 lb weight loss noted   She had  no  Infectious source identified during hospitalization , and no diverticulitis noted on  CT,  But suggestive of  colitis.  Trial of budesonide 3 mg.  Dietary modification recommended,

## 2018-09-19 ENCOUNTER — Other Ambulatory Visit: Payer: Self-pay | Admitting: *Deleted

## 2018-09-19 ENCOUNTER — Encounter: Payer: Self-pay | Admitting: *Deleted

## 2018-09-19 NOTE — Patient Outreach (Signed)
[facility]) [place] Telephone Outreach PCP office completes Transition of Care follow up post-hospital discharge Post-hospital discharge day # 4 Unsuccessful consecutive outreach attempt # 2- new patient  09/19/2018  Savannah Wilson November 20, 1928 [id number]  11:05 am: Unsuccessful telephone outreach x 2 to [name], 85 y/o female referred to [place] by inpatient RN CM after recent hospitalization February 5-6, 2020 for nausea/ vomiting/ diarrhea/ colitis; during hospitalization, patient had episode of atrial fibrillation with RVR and has cardioversion on September 14, 2018.  Patient was discharged home to self-care after refusing home health services.  Patient has history including, but not limited to, A-fib- on ACT; HTN/ HLD; CKD-stage IV; hypothyroidism; GERD; dCHF; and depression.  Attempted initial phone call to patient, number busy; attempted second phone call to her daughter [name], noted by review of EMR to be on [facility] DPR; HIPAA compliant voice mail message left for patient's caregiver/ daughter, requesting return call back.  Plan:  Verified [place] CM unsuccessful patient outreach letter was placed in mail requesting call back in writing on 09/17/2018  Will re-attempt [place] telephone outreach within 4 business days if I do not hear back from patient or her caregiver first.  Oneta Rack, RN, BSN, [employer] Coordinator [facility] Care Management  [phone]

## 2018-09-25 ENCOUNTER — Other Ambulatory Visit: Payer: Self-pay | Admitting: *Deleted

## 2018-09-25 ENCOUNTER — Encounter: Payer: Self-pay | Admitting: *Deleted

## 2018-09-25 NOTE — Patient Outreach (Signed)
[place] [facility]) [place] Telephone Outreach PCP office completes Transition of Care follow up post-hospital discharge Post-hospital discharge day # 12  09/25/2018  FLOYCE BUJAK 1928/12/31 [id number]  Successful telephone outreachto [name], 85 y/o female referred to [place] by inpatient RN CM afterrecent hospitalization February 5-6, 2020 for nausea/ vomiting/ diarrhea/ colitis; during hospitalization, patient had episode of atrial fibrillation with RVR and has cardioversion on September 14, 2018.Patient was discharged home to self-care after refusing home health services. Patient has history including, but not limited to, A-fib- on ACT; HTN/ HLD; CKD-stage IV; hypothyroidism; GERD; dCHF; and depression.  HIPAA/ identity verified during phone call today.  Discussed with patient purpose of today's call and explained that I have been trying to reach her after her hospitalization; [place] services were discussed with patient today, including how [facility] differs from home health services; patient stated that she did not agree to home health services while hospitalized because she already has established private duty caregivers that regularly participate in her care; patient reports today that she does not feel she needs additional services and states she is "doing much better."  Discussed with patient that she shuold have received a letter in the mail from me explaining services, and I encouraged her to contact me should she change her mind and wish to participate in [place] program, as patient stated that she did not wish to write my phone number down/ did not have anything to write with.   Plan:  Will make patient inactive with [place] Management and make patient's PCP aware that she has declined [place], RN, BSN, [employer] Coordinator [facility] Care  Management  [phone]

## 2018-09-26 ENCOUNTER — Emergency Department: Payer: PPO

## 2018-09-26 ENCOUNTER — Other Ambulatory Visit: Payer: Self-pay

## 2018-09-26 ENCOUNTER — Ambulatory Visit: Payer: PPO | Admitting: Nurse Practitioner

## 2018-09-26 ENCOUNTER — Inpatient Hospital Stay
Admission: EM | Admit: 2018-09-26 | Discharge: 2018-10-03 | DRG: 964 | Disposition: A | Discharge status: Skilled Nursing Facility | Payer: PPO | Attending: Internal Medicine | Admitting: Internal Medicine

## 2018-09-26 ENCOUNTER — Encounter: Payer: Self-pay | Admitting: Emergency Medicine

## 2018-09-26 DIAGNOSIS — Z881 Allergy status to other antibiotic agents status: Secondary | ICD-10-CM

## 2018-09-26 DIAGNOSIS — S14155A Other incomplete lesion at C5 level of cervical spinal cord, initial encounter: Secondary | ICD-10-CM | POA: Diagnosis present

## 2018-09-26 DIAGNOSIS — Z9181 History of falling: Secondary | ICD-10-CM

## 2018-09-26 DIAGNOSIS — M6282 Rhabdomyolysis: Secondary | ICD-10-CM | POA: Diagnosis not present

## 2018-09-26 DIAGNOSIS — J449 Chronic obstructive pulmonary disease, unspecified: Secondary | ICD-10-CM | POA: Diagnosis present

## 2018-09-26 DIAGNOSIS — Z79899 Other long term (current) drug therapy: Secondary | ICD-10-CM

## 2018-09-26 DIAGNOSIS — I700 Atherosclerosis of aorta: Secondary | ICD-10-CM | POA: Diagnosis not present

## 2018-09-26 DIAGNOSIS — I480 Paroxysmal atrial fibrillation: Secondary | ICD-10-CM | POA: Diagnosis not present

## 2018-09-26 DIAGNOSIS — R001 Bradycardia, unspecified: Secondary | ICD-10-CM

## 2018-09-26 DIAGNOSIS — S12500A Unspecified displaced fracture of sixth cervical vertebra, initial encounter for closed fracture: Secondary | ICD-10-CM | POA: Diagnosis present

## 2018-09-26 DIAGNOSIS — E876 Hypokalemia: Secondary | ICD-10-CM | POA: Diagnosis not present

## 2018-09-26 DIAGNOSIS — I959 Hypotension, unspecified: Secondary | ICD-10-CM | POA: Diagnosis present

## 2018-09-26 DIAGNOSIS — Z66 Do not resuscitate: Secondary | ICD-10-CM | POA: Diagnosis not present

## 2018-09-26 DIAGNOSIS — E039 Hypothyroidism, unspecified: Secondary | ICD-10-CM | POA: Diagnosis present

## 2018-09-26 DIAGNOSIS — E785 Hyperlipidemia, unspecified: Secondary | ICD-10-CM | POA: Diagnosis present

## 2018-09-26 DIAGNOSIS — N184 Chronic kidney disease, stage 4 (severe): Secondary | ICD-10-CM | POA: Diagnosis not present

## 2018-09-26 DIAGNOSIS — S022XXA Fracture of nasal bones, initial encounter for closed fracture: Secondary | ICD-10-CM

## 2018-09-26 DIAGNOSIS — Z90710 Acquired absence of both cervix and uterus: Secondary | ICD-10-CM

## 2018-09-26 DIAGNOSIS — S12400A Unspecified displaced fracture of fifth cervical vertebra, initial encounter for closed fracture: Secondary | ICD-10-CM | POA: Diagnosis present

## 2018-09-26 DIAGNOSIS — Z841 Family history of disorders of kidney and ureter: Secondary | ICD-10-CM

## 2018-09-26 DIAGNOSIS — I340 Nonrheumatic mitral (valve) insufficiency: Secondary | ICD-10-CM | POA: Diagnosis not present

## 2018-09-26 DIAGNOSIS — W19XXXD Unspecified fall, subsequent encounter: Secondary | ICD-10-CM | POA: Diagnosis not present

## 2018-09-26 DIAGNOSIS — S299XXA Unspecified injury of thorax, initial encounter: Secondary | ICD-10-CM | POA: Diagnosis not present

## 2018-09-26 DIAGNOSIS — I130 Hypertensive heart and chronic kidney disease with heart failure and stage 1 through stage 4 chronic kidney disease, or unspecified chronic kidney disease: Secondary | ICD-10-CM | POA: Diagnosis present

## 2018-09-26 DIAGNOSIS — W010XXA Fall on same level from slipping, tripping and stumbling without subsequent striking against object, initial encounter: Secondary | ICD-10-CM | POA: Diagnosis not present

## 2018-09-26 DIAGNOSIS — K219 Gastro-esophageal reflux disease without esophagitis: Secondary | ICD-10-CM | POA: Diagnosis present

## 2018-09-26 DIAGNOSIS — M5489 Other dorsalgia: Secondary | ICD-10-CM | POA: Diagnosis not present

## 2018-09-26 DIAGNOSIS — R404 Transient alteration of awareness: Secondary | ICD-10-CM | POA: Diagnosis not present

## 2018-09-26 DIAGNOSIS — W19XXXA Unspecified fall, initial encounter: Secondary | ICD-10-CM | POA: Diagnosis not present

## 2018-09-26 DIAGNOSIS — Z79890 Hormone replacement therapy (postmenopausal): Secondary | ICD-10-CM

## 2018-09-26 DIAGNOSIS — G4700 Insomnia, unspecified: Secondary | ICD-10-CM | POA: Diagnosis not present

## 2018-09-26 DIAGNOSIS — S12401D Unspecified nondisplaced fracture of fifth cervical vertebra, subsequent encounter for fracture with routine healing: Secondary | ICD-10-CM | POA: Diagnosis not present

## 2018-09-26 DIAGNOSIS — M2550 Pain in unspecified joint: Secondary | ICD-10-CM | POA: Diagnosis not present

## 2018-09-26 DIAGNOSIS — S12501D Unspecified nondisplaced fracture of sixth cervical vertebra, subsequent encounter for fracture with routine healing: Secondary | ICD-10-CM | POA: Diagnosis not present

## 2018-09-26 DIAGNOSIS — D631 Anemia in chronic kidney disease: Secondary | ICD-10-CM | POA: Diagnosis not present

## 2018-09-26 DIAGNOSIS — S199XXA Unspecified injury of neck, initial encounter: Secondary | ICD-10-CM | POA: Diagnosis not present

## 2018-09-26 DIAGNOSIS — R531 Weakness: Secondary | ICD-10-CM | POA: Diagnosis not present

## 2018-09-26 DIAGNOSIS — I493 Ventricular premature depolarization: Secondary | ICD-10-CM | POA: Diagnosis not present

## 2018-09-26 DIAGNOSIS — J45909 Unspecified asthma, uncomplicated: Secondary | ICD-10-CM | POA: Diagnosis not present

## 2018-09-26 DIAGNOSIS — R1310 Dysphagia, unspecified: Secondary | ICD-10-CM | POA: Diagnosis not present

## 2018-09-26 DIAGNOSIS — M79603 Pain in arm, unspecified: Secondary | ICD-10-CM | POA: Diagnosis not present

## 2018-09-26 DIAGNOSIS — M25551 Pain in right hip: Secondary | ICD-10-CM | POA: Diagnosis not present

## 2018-09-26 DIAGNOSIS — M792 Neuralgia and neuritis, unspecified: Secondary | ICD-10-CM | POA: Diagnosis not present

## 2018-09-26 DIAGNOSIS — I4819 Other persistent atrial fibrillation: Secondary | ICD-10-CM | POA: Diagnosis not present

## 2018-09-26 DIAGNOSIS — I5032 Chronic diastolic (congestive) heart failure: Secondary | ICD-10-CM | POA: Diagnosis not present

## 2018-09-26 DIAGNOSIS — Z888 Allergy status to other drugs, medicaments and biological substances status: Secondary | ICD-10-CM

## 2018-09-26 DIAGNOSIS — F329 Major depressive disorder, single episode, unspecified: Secondary | ICD-10-CM | POA: Diagnosis not present

## 2018-09-26 DIAGNOSIS — Z7951 Long term (current) use of inhaled steroids: Secondary | ICD-10-CM

## 2018-09-26 DIAGNOSIS — S129XXA Fracture of neck, unspecified, initial encounter: Secondary | ICD-10-CM

## 2018-09-26 DIAGNOSIS — S022XXD Fracture of nasal bones, subsequent encounter for fracture with routine healing: Secondary | ICD-10-CM | POA: Diagnosis not present

## 2018-09-26 DIAGNOSIS — R0902 Hypoxemia: Secondary | ICD-10-CM | POA: Diagnosis not present

## 2018-09-26 DIAGNOSIS — M542 Cervicalgia: Secondary | ICD-10-CM | POA: Diagnosis not present

## 2018-09-26 DIAGNOSIS — G2581 Restless legs syndrome: Secondary | ICD-10-CM | POA: Diagnosis not present

## 2018-09-26 DIAGNOSIS — M109 Gout, unspecified: Secondary | ICD-10-CM | POA: Diagnosis present

## 2018-09-26 DIAGNOSIS — T796XXA Traumatic ischemia of muscle, initial encounter: Principal | ICD-10-CM

## 2018-09-26 DIAGNOSIS — Z7401 Bed confinement status: Secondary | ICD-10-CM | POA: Diagnosis not present

## 2018-09-26 DIAGNOSIS — R1110 Vomiting, unspecified: Secondary | ICD-10-CM

## 2018-09-26 DIAGNOSIS — Y92009 Unspecified place in unspecified non-institutional (private) residence as the place of occurrence of the external cause: Secondary | ICD-10-CM

## 2018-09-26 DIAGNOSIS — M25511 Pain in right shoulder: Secondary | ICD-10-CM | POA: Diagnosis not present

## 2018-09-26 DIAGNOSIS — S0003XA Contusion of scalp, initial encounter: Secondary | ICD-10-CM | POA: Diagnosis not present

## 2018-09-26 DIAGNOSIS — M79601 Pain in right arm: Secondary | ICD-10-CM | POA: Diagnosis not present

## 2018-09-26 DIAGNOSIS — N179 Acute kidney failure, unspecified: Secondary | ICD-10-CM | POA: Diagnosis not present

## 2018-09-26 DIAGNOSIS — S79911A Unspecified injury of right hip, initial encounter: Secondary | ICD-10-CM | POA: Diagnosis not present

## 2018-09-26 DIAGNOSIS — S4991XA Unspecified injury of right shoulder and upper arm, initial encounter: Secondary | ICD-10-CM | POA: Diagnosis not present

## 2018-09-26 DIAGNOSIS — S59911A Unspecified injury of right forearm, initial encounter: Secondary | ICD-10-CM | POA: Diagnosis not present

## 2018-09-26 DIAGNOSIS — K2270 Barrett's esophagus without dysplasia: Secondary | ICD-10-CM | POA: Diagnosis present

## 2018-09-26 DIAGNOSIS — I10 Essential (primary) hypertension: Secondary | ICD-10-CM | POA: Diagnosis not present

## 2018-09-26 DIAGNOSIS — R1311 Dysphagia, oral phase: Secondary | ICD-10-CM | POA: Diagnosis not present

## 2018-09-26 DIAGNOSIS — T796XXD Traumatic ischemia of muscle, subsequent encounter: Secondary | ICD-10-CM | POA: Diagnosis not present

## 2018-09-26 DIAGNOSIS — K5790 Diverticulosis of intestine, part unspecified, without perforation or abscess without bleeding: Secondary | ICD-10-CM | POA: Diagnosis not present

## 2018-09-26 DIAGNOSIS — Z8249 Family history of ischemic heart disease and other diseases of the circulatory system: Secondary | ICD-10-CM

## 2018-09-26 DIAGNOSIS — Z23 Encounter for immunization: Secondary | ICD-10-CM

## 2018-09-26 DIAGNOSIS — R52 Pain, unspecified: Secondary | ICD-10-CM | POA: Diagnosis not present

## 2018-09-26 DIAGNOSIS — Z7901 Long term (current) use of anticoagulants: Secondary | ICD-10-CM

## 2018-09-26 DIAGNOSIS — Z9049 Acquired absence of other specified parts of digestive tract: Secondary | ICD-10-CM

## 2018-09-26 HISTORY — DX: Personal history of other medical treatment: Z92.89

## 2018-09-26 HISTORY — DX: Unspecified diastolic (congestive) heart failure: I50.30

## 2018-09-26 LAB — CBC WITH DIFFERENTIAL/PLATELET
Abs Immature Granulocytes: 0.04 10*3/uL (ref 0.00–0.07)
BASOS PCT: 0 %
Basophils Absolute: 0 10*3/uL (ref 0.0–0.1)
EOS ABS: 0 10*3/uL (ref 0.0–0.5)
Eosinophils Relative: 0 %
HCT: 32 % — ABNORMAL LOW (ref 36.0–46.0)
Hemoglobin: 9.5 g/dL — ABNORMAL LOW (ref 12.0–15.0)
Immature Granulocytes: 0 %
Lymphocytes Relative: 6 %
Lymphs Abs: 0.7 10*3/uL (ref 0.7–4.0)
MCH: 27.5 pg (ref 26.0–34.0)
MCHC: 29.7 g/dL — ABNORMAL LOW (ref 30.0–36.0)
MCV: 92.8 fL (ref 80.0–100.0)
Monocytes Absolute: 0.6 10*3/uL (ref 0.1–1.0)
Monocytes Relative: 6 %
Neutro Abs: 9.1 10*3/uL — ABNORMAL HIGH (ref 1.7–7.7)
Neutrophils Relative %: 88 %
PLATELETS: 182 10*3/uL (ref 150–400)
RBC: 3.45 MIL/uL — ABNORMAL LOW (ref 3.87–5.11)
RDW: 17.1 % — ABNORMAL HIGH (ref 11.5–15.5)
WBC: 10.4 10*3/uL (ref 4.0–10.5)
nRBC: 0.2 % (ref 0.0–0.2)

## 2018-09-26 LAB — BASIC METABOLIC PANEL
ANION GAP: 10 (ref 5–15)
BUN: 35 mg/dL — ABNORMAL HIGH (ref 8–23)
CO2: 22 mmol/L (ref 22–32)
Calcium: 8.4 mg/dL — ABNORMAL LOW (ref 8.9–10.3)
Chloride: 104 mmol/L (ref 98–111)
Creatinine, Ser: 1.53 mg/dL — ABNORMAL HIGH (ref 0.44–1.00)
GFR calc Af Amer: 35 mL/min — ABNORMAL LOW (ref >60)
GFR, EST NON AFRICAN AMERICAN: 30 mL/min — AB (ref >60)
Glucose, Bld: 127 mg/dL — ABNORMAL HIGH (ref 70–99)
Potassium: 3.7 mmol/L (ref 3.5–5.1)
Sodium: 136 mmol/L (ref 135–145)

## 2018-09-26 LAB — CK: CK TOTAL: 861 U/L — AB (ref 38–234)

## 2018-09-26 LAB — TROPONIN I: Troponin I: 0.04 ng/mL (ref <0.03)

## 2018-09-26 MED ORDER — APIXABAN 2.5 MG PO TABS
2.5000 mg | ORAL_TABLET | Freq: Two times a day (BID) | ORAL | Status: DC
  Administered 2018-09-26 – 2018-10-03 (×13): 2.5 mg via ORAL
  Filled 2018-09-26 (×14): qty 1

## 2018-09-26 MED ORDER — ONDANSETRON HCL 4 MG/2ML IJ SOLN
4.0000 mg | Freq: Four times a day (QID) | INTRAMUSCULAR | Status: DC | PRN
  Administered 2018-09-26 – 2018-09-27 (×2): 4 mg via INTRAVENOUS
  Filled 2018-09-26 (×4): qty 2

## 2018-09-26 MED ORDER — FENTANYL CITRATE (PF) 100 MCG/2ML IJ SOLN
12.5000 ug | INTRAMUSCULAR | Status: DC | PRN
  Administered 2018-09-27: 12.5 ug via INTRAVENOUS
  Filled 2018-09-26: qty 2

## 2018-09-26 MED ORDER — MORPHINE SULFATE (PF) 2 MG/ML IV SOLN
2.0000 mg | INTRAVENOUS | Status: DC | PRN
  Administered 2018-09-26: 2 mg via INTRAVENOUS
  Filled 2018-09-26 (×2): qty 1

## 2018-09-26 MED ORDER — BISACODYL 5 MG PO TBEC
5.0000 mg | DELAYED_RELEASE_TABLET | Freq: Every day | ORAL | Status: DC | PRN
  Administered 2018-09-30: 5 mg via ORAL
  Filled 2018-09-26 (×2): qty 1

## 2018-09-26 MED ORDER — DOCUSATE SODIUM 100 MG PO CAPS
100.0000 mg | ORAL_CAPSULE | Freq: Two times a day (BID) | ORAL | Status: DC
  Administered 2018-09-26 – 2018-10-02 (×8): 100 mg via ORAL
  Filled 2018-09-26 (×9): qty 1

## 2018-09-26 MED ORDER — ONDANSETRON HCL 4 MG/2ML IJ SOLN
4.0000 mg | Freq: Once | INTRAMUSCULAR | Status: AC
  Administered 2018-09-26: 4 mg via INTRAVENOUS
  Filled 2018-09-26: qty 2

## 2018-09-26 MED ORDER — ONDANSETRON HCL 4 MG PO TABS: 4.0000 mg | ORAL_TABLET | Freq: Four times a day (QID) | ORAL | Status: DC | PRN

## 2018-09-26 MED ORDER — FENTANYL CITRATE (PF) 100 MCG/2ML IJ SOLN
50.0000 ug | Freq: Once | INTRAMUSCULAR | Status: AC
  Administered 2018-09-26: 50 ug via INTRAVENOUS
  Filled 2018-09-26: qty 2

## 2018-09-26 MED ORDER — TETANUS-DIPHTH-ACELL PERTUSSIS 5-2.5-18.5 LF-MCG/0.5 IM SUSP
0.5000 mL | Freq: Once | INTRAMUSCULAR | Status: AC
  Administered 2018-09-26: 0.5 mL via INTRAMUSCULAR
  Filled 2018-09-26: qty 0.5

## 2018-09-26 MED ORDER — DOPAMINE-DEXTROSE 3.2-5 MG/ML-% IV SOLN
0.0000 ug/kg/min | INTRAVENOUS | Status: DC
  Administered 2018-09-26: 5 ug/kg/min via INTRAVENOUS
  Filled 2018-09-26: qty 250

## 2018-09-26 MED ORDER — ACETAMINOPHEN 325 MG PO TABS
650.0000 mg | ORAL_TABLET | Freq: Four times a day (QID) | ORAL | Status: DC | PRN
  Administered 2018-09-26 – 2018-10-02 (×5): 650 mg via ORAL
  Filled 2018-09-26 (×5): qty 2

## 2018-09-26 MED ORDER — ACETAMINOPHEN 650 MG RE SUPP: 650.0000 mg | Freq: Four times a day (QID) | RECTAL | Status: DC | PRN

## 2018-09-26 MED ORDER — TRAMADOL HCL 50 MG PO TABS
50.0000 mg | ORAL_TABLET | Freq: Four times a day (QID) | ORAL | Status: DC | PRN
  Administered 2018-09-26 – 2018-10-03 (×9): 50 mg via ORAL
  Filled 2018-09-26 (×9): qty 1

## 2018-09-26 MED ORDER — SODIUM CHLORIDE 0.9 % IV BOLUS
1000.0000 mL | Freq: Once | INTRAVENOUS | Status: AC
  Administered 2018-09-26: 1000 mL via INTRAVENOUS

## 2018-09-26 MED ORDER — SODIUM CHLORIDE 0.9 % IV SOLN
INTRAVENOUS | Status: DC
  Administered 2018-09-26: 75 mL/h via INTRAVENOUS

## 2018-09-26 NOTE — ED Notes (Signed)
Patient transported to CT 

## 2018-09-26 NOTE — Consult Note (Addendum)
Cardiology Consult    Patient ID: Savannah Wilson MRN: [id number], DOB/AGE: 10/30/1928   Admit date: 09/26/2018 Date of Consult: 09/26/2018  Primary Physician: [name], MD Primary Cardiologist: Kathlyn Sacramento, MD Requesting Provider: Ricka Burdock, MD  Patient Profile    Savannah Wilson is a 85 y.o. female with a history of paroxysmal atrial fibrillation, chronic diastolic congestive heart failure, stage IV chronic kidney disease, asthma/COPD, hypertension, hypothyroidism, anemia of chronic disease, and Barrett's esophagus, who is being seen today for the evaluation of bradycardia at the request of Dr. [name].  Past Medical History   Past Medical History:  Diagnosis Date  . (HFpEF) heart failure with preserved ejection fraction ([place])    a. 08/2018 Echo: EF 55-60%, no rwma, Mild MR, mod dil LA. Nl RV fxn.  . Asthma   . Barrett esophagus   . Chronic bronchitis   . Chronic kidney disease (CKD), stage IV (severe) ([place])   . Depression   . Diverticulosis   . GERD (gastroesophageal reflux disease)   . Gout   . History of stress test    a. 04/2017 MV: prior inf/inflat infarct w/ peri-infarct ischemia. EF 55-65%. Sev hypotension w/ lexiscan.  . Hyperlipidemia   . Hypertension   . Hypothyroid   . Mitral regurgitation    a. TTE 02/2017: EF 55-60%, normal WM, calcified mitral annulus with mod regurg, moderately dilated LA; b. 08/2018 Echo: Mild MR.  . Pancreatitis due to biliary obstruction March 2010   s/p ERCP sphincterotomy, cholecystectomy  . Peripheral neuralgia   . Persistent atrial fibrillation    a. diagnosed 01/24/17-> CHADS2VASc => 5 (HTN, age x 2, vascular disease with PAD and aortic plaque, female) giving her an estimated annual stroke risk of 6.7%->Eliquis; b. successful DCCV 03/28/2017; c. BB d/c'd 2/2 bradycardia; d. 08/2018 Recurrent Afib-> DCCV-> dilt started.  . Venous insufficiency     Past Surgical History:  Procedure Laterality Date  . ABDOMINAL HYSTERECTOMY    .  CARDIOVERSION N/A 03/28/2017   Procedure: CARDIOVERSION;  Surgeon: Nelva Bush, MD;  Location: [facility];  Service: Cardiovascular;  Laterality: N/A;  . CARDIOVERSION N/A 09/14/2018   Procedure: CARDIOVERSION;  Surgeon: Minna Merritts, MD;  Location: [facility];  Service: Cardiovascular;  Laterality: N/A;  . CARPAL TUNNEL RELEASE  jan 2013   [name]  . CATARACT EXTRACTION     right  . CATARACT EXTRACTION  2008  . CHOLECYSTECTOMY  02/10  . CHOLECYSTECTOMY  2010  . TONSILLECTOMY    . VENTRAL HERNIA REPAIR    . VENTRAL HERNIA REPAIR  2007  . VESICOVAGINAL FISTULA CLOSURE W/ TAH       Allergies  Allergies  Allergen Reactions  . Promethazine Anxiety, Palpitations and Other (See Comments)    IV Promethazine 6.25 mg. JIttery, anxious, couldn't lay still, increase HR  . Ace Inhibitors Cough  . Benicar [Olmesartan Medoxomil]     Unsure of reaction type  . Clarithromycin Other (See Comments)    Blisters in mouth  . Clarithromycin Other (See Comments)    Unknown   . Norvasc [Amlodipine Besylate]   . Levofloxacin Rash  . Zithromax [Azithromycin Dihydrate] Rash    History of Present Illness    85 year old female with the above complex past medical history including paroxysmal atrial fibrillation, chronic diastolic congestive heart failure, stage IV chronic kidney disease, asthma/COPD, hypertension, hypothyroidism, anemia of chronic disease, and Barrett's esophagus.  She previously underwent cardioversion for atrial fibrillation in August 2018.  Echocardiogram at  that time showed moderate mitral regurgitation.  She subsequently underwent stress testing which showed prior inferior/inferolateral infarct with minimal peri-infarct ischemia.  She had severe hypotension with Carlton Adam has been medically managed for presumed underlying coronary disease.  She has been chronically anticoagulated with Eliquis and in the setting of bradycardia, beta-blocker therapy was discontinued in July 2019.    In January 2020, she was admitted with paroxysmal atrial fibrillation and abdominal pain/diarrhea.  Echocardiogram showed normal LV function with mild mitral regurgitation.  Rates were well controlled in the absence of AV nodal blocking agents and she was subsequently discharged home on January 19.  Unfortunately, she was readmitted in early February with increasing dizziness, fatigue, and diarrhea.  She suffered a fall and struck her head.  Head CT showed no acute intracranial findings.  She remained in atrial fibrillation on readmission with rates in the 100-120 range and reported reduced functional capacity in the setting of atrial fibrillation.  As such, following gentle diuresis, she underwent successful cardioversion on February 7.  Low-dose diltiazem therapy was added to her regimen and heart rates were stable post cardioversion.  Etiology of nausea, vomiting, and diarrhea remains unclear.  Stool for C.  Cultures were negative.  CT of the abdomen and pelvis suggested mild colitis and diverticulosis without diverticulitis.  She was subsequently discharged on February 8.  She follow-up with her primary care provider on February 11, at which time she was mildly bradycardic with a rate of 54.  She reported lethargy and ongoing nausea, anorexia, and vomiting.  Weight was noted to be 10 pounds.  Symbicort therapy was discontinued in favor of Entocort EC.  Unfortunately, Savannah Wilson has continued to feel poorly.  This morning just after 5 AM, she got up to use the bathroom and was reaching for her robe but lost her balance and fell forward, striking her right shoulder and face on the floor.  She has significant right shoulder and facial pain.  She was unable to get up off the floor.  When she did not answer the phone later, her daughter presented to her home and found her on the floor at approximately 11 AM.  She was taken to the emergency department where head CT showed no acute intracranial process but did  show a C5-6 spinous process fracture.  She was also noted to be in sinus bradycardia with rates in the low 40s.  Pain management has been somewhat limited due to low blood pressures in the low 100s to 1 teens.  Patient denies chest pain or dyspnea.  Her greatest complaint at this time is severe right shoulder pain.  Inpatient Medications    . apixaban  2.5 mg Oral BID    Family History    Family History  Problem Relation Age of Onset  . Kidney failure Mother   . Hypertension Mother   . Kidney disease Mother   . Multiple myeloma Daughter   . Cancer Daughter        multiple myeloma  . Kidney disease Daughter   . Heart disease Father   . Rheumatologic disease Father   . Kidney cancer Father    She indicated that her mother is deceased. She indicated that her father is deceased. She indicated that her daughter is alive.   Social History    Social History   Socioeconomic History  . Marital status: Widowed    Spouse name: Not on file  . Number of children: 2  . Years of education: Not on  file  . Highest education level: Not on file  Occupational History  . 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 Financial resource strain: Not hard at all  . Food insecurity:    Worry: Never true    Inability: Never true  . Transportation needs:    Medical: No    Non-medical: No  Tobacco Use  . Smoking status: Never Smoker  . Smokeless tobacco: Never Used  Substance and Sexual Activity  . Alcohol use: No    Comment: rare  . Drug use: No  . Sexual activity: Never  Lifestyle  . Physical activity:    Days per week: Not on file    Minutes per session: Not on file  . Stress: Not on file  Relationships  . Social connections:    Talks on phone: Not on file    Gets together: Not on file    Attends religious service: Not on file    Active member of club or organization: Not on file    Attends meetings of clubs or organizations: Not on file    Relationship status:  Not on file  . Intimate partner violence:    Fear of current or ex partner: Not on file    Emotionally abused: Not on file    Physically abused: Not on file    Forced sexual activity: Not on file  Other Topics Concern  . Not on file  Social History Narrative   ** Merged History Encounter **       Widowed. Has 2 children and lives alone.   Lives by herself. Has a cane, but ambulates independently     Review of Systems    General: Generalized malaise and weakness.  No chills, fever, night sweats or weight changes.  Cardiovascular:  No chest pain, dyspnea on exertion, edema, orthopnea, palpitations, paroxysmal nocturnal dyspnea. Dermatological: No rash, lesions/masses Respiratory: No cough, dyspnea Urologic: No hematuria, dysuria Abdominal:   +++ nausea, vomiting, diarrhea.  No bright red blood per rectum, melena, or hematemesis Neurologic:  No visual changes, +++ wkns, no changes in mental status. Musculoskeletal: Severe right shoulder pain. All other systems reviewed and are otherwise negative except as noted above.  Physical Exam    Blood pressure (!) 117/49, pulse (!) 39, temperature 98.2 F (36.8 C), temperature source Oral, resp. rate 16, height 5' (1.524 m), weight 59.9 kg, SpO2 95 %.  General: Pleasant.  Complains of severe right shoulder pain. Psych: Flat affect. Neuro: Alert and oriented X 3. Moves all extremities spontaneously. HEENT: Normal.  Notable for significant facial bruising.  Neck: Supple without bruits or JVD. Lungs:  Resp regular and unlabored, diminished breath sounds bilaterally. Heart: RRR, bradycardic, no s3, s4.  2/6 systolic murmur at the apex. Abdomen: Soft, non-tender, non-distended, BS + x 4.  Extremities: No clubbing, cyanosis or edema. DP/PT/Radials 1+ and equal bilaterally.  Labs    Troponin  Recent Labs    09/26/18 1218  CKTOTAL 861*  TROPONINI 0.04*   Lab Results  Component Value Date   WBC 10.4 09/26/2018   HGB 9.5 (L) 09/26/2018    HCT 32.0 (L) 09/26/2018   MCV 92.8 09/26/2018   PLT 182 09/26/2018    Recent Labs  Lab 09/26/18 1218  NA 136  K 3.7  CL 104  CO2 22  BUN 35*  CREATININE 1.53*  CALCIUM 8.4*  GLUCOSE 127*   Lab Results  Component Value Date   CHOL 84 08/24/2018  HDL 27 (L) 08/24/2018   LDLCALC 45 08/24/2018   TRIG 60 08/24/2018    Radiology Studies    Ct Abdomen Pelvis Wo Contrast  Result Date: 09/12/2018 CLINICAL DATA:  Nausea, vomiting, generalized abdominal pain EXAM: CT ABDOMEN AND PELVIS WITHOUT CONTRAST TECHNIQUE: Multidetector CT imaging of the abdomen and pelvis was performed following the standard protocol without IV contrast. COMPARISON:  08/23/2018, 06/21/2013, 09/30/2008 FINDINGS: Lower chest: Moderate right and small left pleural effusion. Right basilar compressive atelectasis. Hepatobiliary: No focal liver abnormality is seen. Status post cholecystectomy. No biliary dilatation. Pancreas: Unremarkable. No pancreatic ductal dilatation or surrounding inflammatory changes. Spleen: Normal in size without focal abnormality. Adrenals/Urinary Tract: Normal adrenal glands. 2.4 x 2.9 cm intermediate density left posterior interpolar renal mass measuring 20 Hounsfield units. Stable 12 mm exophytic right upper pole renal mass. No urolithiasis or obstructive uropathy. Normal bladder. Stomach/Bowel: Large hiatal hernia. No bowel dilatation to suggest bowel obstruction. Diverticulosis without evidence of diverticulitis. Relative bowel wall thickening throughout the colon which may be secondary to underdistention versus mild colitis. No significant pericolonic inflammatory changes. Trace pelvic free fluid. No pneumatosis, pneumoperitoneum or portal venous gas. Vascular/Lymphatic: Abdominal aortic atherosclerosis. Normal caliber abdominal aorta. No lymphadenopathy. Reproductive: Status post hysterectomy. No adnexal masses. Other: Small fat containing right periumbilical hernia. No fluid collection or  hematoma. No ascites. Musculoskeletal: No acute osseous abnormality. No aggressive osseous lesion. Degenerative disc disease with disc height loss throughout the thoracolumbar spine. Grade 1 anterolisthesis of L4 on L5 secondary to severe bilateral facet arthropathy. Bilateral foraminal stenosis at L3-4, L4-5 and L5-S1. Bilateral facet arthropathy throughout the thoracolumbar spine. IMPRESSION: 1. Relative bowel wall thickening throughout the colon without surrounding inflammatory changes which may be secondary to underdistention versus mild colitis. 2. No bowel obstruction. 3. Diverticulosis without evidence of diverticulitis. 4. Thoracolumbar spine spondylosis 5.  Aortic Atherosclerosis (ICD10-I70.0). 6. 2.4 x 2.9 cm indeterminate left posterior interpolar renal mass unchanged compared with 08/23/2018, but significantly enlarged compared with prior CT abdomen dated 06/21/2013. Recommend further characterization with a nonemergent MRI of the abdomen without and with intravenous contrast. Electronically Signed   By: Kathreen Devoid   On: 09/12/2018 23:41   Dg Chest 1 View  Result Date: 09/26/2018 CLINICAL DATA:  Unwitnessed fall this morning. The patient was found down. EXAM: CHEST  1 VIEW COMPARISON:  09/11/2018, 08/27/2018 and 09/11/2017 and CT scan of the abdomen dated 08/23/2018 FINDINGS: Heart size and pulmonary vascularity are normal. Aortic atherosclerosis. Large hiatal hernia. The left pleural effusion has resolved. Small right pleural effusion has diminished. No acute bone abnormality. IMPRESSION: 1. Improved right effusion.  Resolved left effusion. 2.  Aortic Atherosclerosis (ICD10-I70.0). 3. No acute abnormalities. 4. Large hiatal hernia, chronic. Electronically Signed   By: Lorriane Shire M.D.   On: 09/26/2018 13:58   Dg Chest 2 View  Result Date: 09/11/2018 CLINICAL DATA:  85 year old female who fell after becoming dizzy. EXAM: CHEST - 2 VIEW COMPARISON:  08/27/2018 and earlier. FINDINGS: Seated AP  and lateral views of the chest. Increased bilateral pleural effusions since last month, small to moderate and greater on the right. No superimposed pulmonary edema. Stable superimposed cardiomegaly and mediastinal contours. Visualized tracheal air column is within normal limits. No pneumothorax. Stable visualized osseous structures. Abdominal Calcified aortic atherosclerosis. Negative visible bowel gas pattern. IMPRESSION: 1. Increased bilateral pleural effusions since last month, small to moderate in greater on the right. 2.  No acute traumatic injury identified. 3.  Aortic Atherosclerosis (ICD10-I70.0). Electronically Signed   By:  Genevie Ann M.D.   On: 09/11/2018 19:17   Dg Forearm Right  Result Date: 09/26/2018 CLINICAL DATA:  Right arm pain secondary to a fall at home this morning. EXAM: RIGHT FOREARM - 2 VIEW COMPARISON:  None. FINDINGS: There is no fracture or dislocation. Slight calcific tendinopathy at the lateral epicondyle of the distal humerus. 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 Severe arthritis of the first [facility] joint. Multiple phleboliths in the forearm. IMPRESSION: No acute abnormality.  Chronic degenerative changes as described. Electronically Signed   By: Lorriane Shire M.D.   On: 09/26/2018 13:53   Ct Head Wo Contrast  Result Date: 09/26/2018 CLINICAL DATA:  Unwitnessed fall at home this morning at 0500 hours, laid on floor until EMS was called, fell onto face, unknown loss of consciousness, neck pain, history hypertension, atrial fibrillation, stage IV chronic kidney disease, CHF EXAM: CT HEAD WITHOUT CONTRAST CT MAXILLOFACIAL WITHOUT CONTRAST CT CERVICAL SPINE WITHOUT CONTRAST TECHNIQUE: Multidetector CT imaging of the head, cervical spine, and maxillofacial structures were performed using the standard protocol without intravenous contrast. Multiplanar CT image reconstructions of the cervical spine  and maxillofacial structures were also generated. Right side of face marked with BB. COMPARISON:  CT head 09/12/2018 CT cervical spine 09/11/2018 FINDINGS: CT HEAD FINDINGS Brain: Generalized atrophy. Normal ventricular morphology. No midline shift or mass effect. Small vessel chronic ischemic changes of deep cerebral white matter. No intracranial hemorrhage, mass lesion, evidence of acute infarction, or extra-axial fluid collection. Vascular: No hyperdense vessels. Atherosclerotic calcifications of internal carotid arteries at skull base. Skull: Frontal scalp hematoma.  Calvaria intact. Other: N/A CT MAXILLOFACIAL FINDINGS Osseous: Minimally displaced distal RIGHT nasal bone fracture. Scattered beam hardening artifacts of dental origin. TMJ alignment normal. Bony orbits and zygomas intact. No additional facial bone fractures identified. Orbits: Bony orbits intact. Intraorbital soft tissue planes clear bilaterally without fluid or pneumatosis Sinuses: Paranasal sinuses, mastoid air cells and middle ear cavities clear bilaterally Soft tissues: Frontal scalp hematoma extending RIGHT periorbital. Remaining facial soft tissues unremarkable. CT CERVICAL SPINE FINDINGS Alignment: Mild anterolisthesis C3-C4. Minimal retrolisthesis C4-C5 and C5-C6. Remaining alignments normal Skull base and vertebrae: Osseous demineralization. Multilevel facet degenerative changes. Disc space narrowing and endplate spur formation C4-C5 through C6-C7. Vertebral body heights maintained. Fractured spinous processes into her RIGHT lamina at C5 and at spinous process of C6. Uncovertebral spurs encroach upon the C5-C6 neural foramina bilaterally less at C4-C5 on LEFT. Soft tissues and spinal canal: Prevertebral soft tissues normal thickness. Atherosclerotic calcifications at the carotid bifurcations bilaterally. Remaining cervical soft tissues unremarkable. Disc levels: Significant endplate spur formation and uncovertebral spurring narrow the  LEFT C5-C6 neural foramen and also flattening the LEFT ventral aspect of the thecal sac and spinal cord. Upper chest: Small RIGHT pleural effusion. Lung apices otherwise clear. Other: N/A IMPRESSION: Atrophy with small vessel chronic ischemic changes of deep cerebral white matter. No acute intracranial abnormalities. Fractured spinous process C6. Fractured spinous process C5 into RIGHT lamina of C5. Multilevel degenerative disc and facet disease changes of the cervical spine. Compression of the LEFT ventral aspect of the thecal sac and spinal cord at C5-C6 by endplate and uncovertebral spur formation. Minimally displaced distal RIGHT nasal bone fracture. No other facial bone fractures identified. Findings called to Dr.  Joni Fears on 09/26/2018 at 1342 hours. Electronically Signed   By: Lavonia Dana M.D.   On: 09/26/2018 13:42   Ct Head Wo Contrast  Result Date: 09/12/2018 CLINICAL DATA:  Fall. Headache. Nausea and vomiting. EXAM: CT HEAD WITHOUT CONTRAST TECHNIQUE: Contiguous axial images were obtained from the base of the skull through the vertex without intravenous contrast. COMPARISON:  CT head 09/11/2018. FINDINGS: Brain: No evidence for acute infarction, hemorrhage, mass lesion, hydrocephalus, or extra-axial fluid. Age related atrophy. Hypoattenuation of white matter, likely small vessel disease. No delayed intra cerebral hematoma. No concerning extra-axial hemorrhage related to the scalp hematoma nor is there contralateral visible injury. Vascular: Calcification of the cavernous internal carotid arteries consistent with cerebrovascular atherosclerotic disease. No signs of intracranial large vessel occlusion. Skull: LEFT parietal scalp hematoma. No underlying skull fracture. Sinuses/Orbits: No acute finding. Other: None. IMPRESSION: LEFT parietal scalp hematoma without underlying skull fracture or intracranial hemorrhage. No change compared with yesterday's CT head. Electronically Signed   By: Staci Righter  M.D.   On: 09/12/2018 18:36   Ct Head Wo Contrast  Result Date: 09/11/2018 CLINICAL DATA:  [address] and hit her head on the floor with a resultant posterior lump. She fell due to dizziness. EXAM: CT HEAD WITHOUT CONTRAST CT CERVICAL SPINE WITHOUT CONTRAST TECHNIQUE: Multidetector CT imaging of the head and cervical spine was performed following the standard protocol without intravenous contrast. Multiplanar CT image reconstructions of the cervical spine were also generated. COMPARISON:  Brain MR dated 06/01/2012. FINDINGS: CT HEAD FINDINGS Brain: Mild-to-moderate enlargement of the ventricles and subarachnoid spaces. Mild to moderate patchy white matter low density in both cerebral hemispheres. No intracranial hemorrhage, mass lesion or CT evidence of acute infarction. Vascular: No hyperdense vessel or unexpected calcification. Skull: Normal. Negative for fracture or focal lesion. Sinuses/Orbits: Status post bilateral cataract extraction. Unremarkable paranasal sinuses. Other: Small left posterior scalp hematoma. CT CERVICAL SPINE FINDINGS Alignment: Mild reversal of the normal cervical lordosis. 3 mm of anterolisthesis at the C3-4 level. 2 mm of retrolisthesis at the C5-6 level. Skull base and vertebrae: No acute fracture. No primary bone lesion or focal pathologic process. Soft tissues and spinal canal: No prevertebral fluid or swelling. No visible canal hematoma. Disc levels: Multilevel degenerative changes, including facet degenerative changes. These are most pronounced on the right at the C3-4 level. The C2 and C3 facets are fused on the left. Upper chest: Large right pleural effusion and moderate-sized left pleural effusion. Other: Bilateral carotid artery calcifications. IMPRESSION: 1. Small left posterior scalp hematoma without skull fracture or intracranial hemorrhage. 2. No cervical spine fracture or traumatic subluxation. 3. Large right pleural effusion and moderate-sized left pleural effusion. 4. Mild to  moderate diffuse cerebral and cerebellar atrophy. 5. Mild to moderate chronic small vessel white matter ischemic changes in both cerebral hemispheres. 6. Multilevel cervical spine degenerative changes with associated subluxations, as described above. 7. Bilateral carotid artery atheromatous calcifications. Electronically Signed   By: Claudie Revering M.D.   On: 09/11/2018 18:28   Ct Cervical Spine Wo Contrast  Result Date: 09/26/2018 CLINICAL DATA:  Unwitnessed fall at home this morning at 0500 hours, laid on floor until EMS was called, fell onto face, unknown loss of consciousness, neck pain, history hypertension, atrial fibrillation, stage IV chronic kidney disease, CHF EXAM: CT HEAD WITHOUT CONTRAST CT MAXILLOFACIAL WITHOUT CONTRAST CT CERVICAL SPINE WITHOUT CONTRAST TECHNIQUE: Multidetector CT imaging of the head, cervical spine, and maxillofacial structures were performed using the standard protocol without intravenous contrast. Multiplanar CT image reconstructions of the cervical spine and maxillofacial structures were also generated. Right side of face marked with BB. COMPARISON:  CT head 09/12/2018 CT cervical spine  09/11/2018 FINDINGS: CT HEAD FINDINGS Brain: Generalized atrophy. Normal ventricular morphology. No midline shift or mass effect. Small vessel chronic ischemic changes of deep cerebral white matter. No intracranial hemorrhage, mass lesion, evidence of acute infarction, or extra-axial fluid collection. Vascular: No hyperdense vessels. Atherosclerotic calcifications of internal carotid arteries at skull base. Skull: Frontal scalp hematoma.  Calvaria intact. Other: N/A CT MAXILLOFACIAL FINDINGS Osseous: Minimally displaced distal RIGHT nasal bone fracture. Scattered beam hardening artifacts of dental origin. TMJ alignment normal. Bony orbits and zygomas intact. No additional facial bone fractures identified. Orbits: Bony orbits intact. Intraorbital soft tissue planes clear bilaterally without fluid  or pneumatosis Sinuses: Paranasal sinuses, mastoid air cells and middle ear cavities clear bilaterally Soft tissues: Frontal scalp hematoma extending RIGHT periorbital. Remaining facial soft tissues unremarkable. CT CERVICAL SPINE FINDINGS Alignment: Mild anterolisthesis C3-C4. Minimal retrolisthesis C4-C5 and C5-C6. Remaining alignments normal Skull base and vertebrae: Osseous demineralization. Multilevel facet degenerative changes. Disc space narrowing and endplate spur formation C4-C5 through C6-C7. Vertebral body heights maintained. Fractured spinous processes into her RIGHT lamina at C5 and at spinous process of C6. Uncovertebral spurs encroach upon the C5-C6 neural foramina bilaterally less at C4-C5 on LEFT. Soft tissues and spinal canal: Prevertebral soft tissues normal thickness. Atherosclerotic calcifications at the carotid bifurcations bilaterally. Remaining cervical soft tissues unremarkable. Disc levels: Significant endplate spur formation and uncovertebral spurring narrow the LEFT C5-C6 neural foramen and also flattening the LEFT ventral aspect of the thecal sac and spinal cord. Upper chest: Small RIGHT pleural effusion. Lung apices otherwise clear. Other: N/A IMPRESSION: Atrophy with small vessel chronic ischemic changes of deep cerebral white matter. No acute intracranial abnormalities. Fractured spinous process C6. Fractured spinous process C5 into RIGHT lamina of C5. Multilevel degenerative disc and facet disease changes of the cervical spine. Compression of the LEFT ventral aspect of the thecal sac and spinal cord at C5-C6 by endplate and uncovertebral spur formation. Minimally displaced distal RIGHT nasal bone fracture. No other facial bone fractures identified. Findings called to Dr.  Joni Fears on 09/26/2018 at 1342 hours. Electronically Signed   By: Lavonia Dana M.D.   On: 09/26/2018 13:42   Ct Hip Right Wo Contrast  Result Date: 09/26/2018 CLINICAL DATA:  Right hip pain since an unwitnessed  fall this morning. EXAM: CT OF THE RIGHT HIP WITHOUT CONTRAST TECHNIQUE: Multidetector CT imaging of the right hip was performed according to the standard protocol. Multiplanar CT image reconstructions were also generated. COMPARISON:  Radiographs dated 09/26/2018 FINDINGS: Bones/Joint/Cartilage There is no fracture or dislocation. Minimal marginal osteophytes on the right acetabulum. Slight calcifications at the gluteal tendon insertions on the right greater trochanter. No effusion. Muscles and Tendons Normal. Soft tissues No acute abnormality.  Sigmoid diverticulosis. IMPRESSION: No acute abnormality of the right hip. Electronically Signed   By: Lorriane Shire M.D.   On: 09/26/2018 14:35   Dg Humerus Right  Result Date: 09/26/2018 CLINICAL DATA:  Right arm pain due to an unwitnessed fall at home this morning. EXAM: RIGHT HUMERUS - 2+ VIEW COMPARISON:  None. FINDINGS: There is no fracture or dislocation. There is slight chronic changes at the lateral epicondyle of the distal humerus as well as calcific tendinopathy at the rotator cuff of the right shoulder. Slight degenerative changes of the acromion with narrowing of the subacromial joint space consistent with rotator cuff disease. IMPRESSION: No acute abnormality of the right humerus. Electronically Signed   By: Lorriane Shire M.D.   On: 09/26/2018 13:51   Dg Hip Unilat W  Or Wo Pelvis 2-3 Views Right  Result Date: 09/26/2018 CLINICAL DATA:  Right hip pain after a fall at home this morning. EXAM: DG HIP (WITH OR WITHOUT PELVIS) 2-3V RIGHT COMPARISON:  None. FINDINGS: There is no evidence of hip fracture or dislocation. There is no evidence of significant arthropathy or other focal bone abnormality of the right hip. Medial joint space narrowing of the left hip. IMPRESSION: No acute abnormalities.  Negative right hip. Electronically Signed   By: Lorriane Shire M.D.   On: 09/26/2018 13:55   Ct Maxillofacial Wo Contrast  Result Date: 09/26/2018 CLINICAL  DATA:  Unwitnessed fall at home this morning at 0500 hours, laid on floor until EMS was called, fell onto face, unknown loss of consciousness, neck pain, history hypertension, atrial fibrillation, stage IV chronic kidney disease, CHF EXAM: CT HEAD WITHOUT CONTRAST CT MAXILLOFACIAL WITHOUT CONTRAST CT CERVICAL SPINE WITHOUT CONTRAST TECHNIQUE: Multidetector CT imaging of the head, cervical spine, and maxillofacial structures were performed using the standard protocol without intravenous contrast. Multiplanar CT image reconstructions of the cervical spine and maxillofacial structures were also generated. Right side of face marked with BB. COMPARISON:  CT head 09/12/2018 CT cervical spine 09/11/2018 FINDINGS: CT HEAD FINDINGS Brain: Generalized atrophy. Normal ventricular morphology. No midline shift or mass effect. Small vessel chronic ischemic changes of deep cerebral white matter. No intracranial hemorrhage, mass lesion, evidence of acute infarction, or extra-axial fluid collection. Vascular: No hyperdense vessels. Atherosclerotic calcifications of internal carotid arteries at skull base. Skull: Frontal scalp hematoma.  Calvaria intact. Other: N/A CT MAXILLOFACIAL FINDINGS Osseous: Minimally displaced distal RIGHT nasal bone fracture. Scattered beam hardening artifacts of dental origin. TMJ alignment normal. Bony orbits and zygomas intact. No additional facial bone fractures identified. Orbits: Bony orbits intact. Intraorbital soft tissue planes clear bilaterally without fluid or pneumatosis Sinuses: Paranasal sinuses, mastoid air cells and middle ear cavities clear bilaterally Soft tissues: Frontal scalp hematoma extending RIGHT periorbital. Remaining facial soft tissues unremarkable. CT CERVICAL SPINE FINDINGS Alignment: Mild anterolisthesis C3-C4. Minimal retrolisthesis C4-C5 and C5-C6. Remaining alignments normal Skull base and vertebrae: Osseous demineralization. Multilevel facet degenerative changes. Disc  space narrowing and endplate spur formation C4-C5 through C6-C7. Vertebral body heights maintained. Fractured spinous processes into her RIGHT lamina at C5 and at spinous process of C6. Uncovertebral spurs encroach upon the C5-C6 neural foramina bilaterally less at C4-C5 on LEFT. Soft tissues and spinal canal: Prevertebral soft tissues normal thickness. Atherosclerotic calcifications at the carotid bifurcations bilaterally. Remaining cervical soft tissues unremarkable. Disc levels: Significant endplate spur formation and uncovertebral spurring narrow the LEFT C5-C6 neural foramen and also flattening the LEFT ventral aspect of the thecal sac and spinal cord. Upper chest: Small RIGHT pleural effusion. Lung apices otherwise clear. Other: N/A IMPRESSION: Atrophy with small vessel chronic ischemic changes of deep cerebral white matter. No acute intracranial abnormalities. Fractured spinous process C6. Fractured spinous process C5 into RIGHT lamina of C5. Multilevel degenerative disc and facet disease changes of the cervical spine. Compression of the LEFT ventral aspect of the thecal sac and spinal cord at C5-C6 by endplate and uncovertebral spur formation. Minimally displaced distal RIGHT nasal bone fracture. No other facial bone fractures identified. Findings called to Dr.  Joni Fears on 09/26/2018 at 1342 hours. Electronically Signed   By: Lavonia Dana M.D.   On: 09/26/2018 13:42    ECG & Cardiac Imaging    Twelve-lead not currently available.  Telemetry reviewed in detail with finding of sinus bradycardia and intermittent PACs.  No evidence  of high-grade heart block.- personally reviewed.  Assessment & Plan    1.  Sinus bradycardia: Patient presented following fall and prolonged time on the floor-approximately 6 hours.  She has been found to be bradycardic with rates in the 40s though blood pressures relatively stable, though soft in the low 100s to 1 teens.  She has significant pain related to her fall and  right shoulder discomfort.  She was recently placed on diltiazem therapy in the setting of paroxysmal atrial fibrillation.  Last known dose of diltiazem would have been February 18, she did not take any of her medicines this morning.  Continue to hold diltiazem and for the time being, we will add low-dose dopamine therapy in order to support heart rate and blood pressure and allow for adequate pain control.  In that setting, she will require intensive care unit monitoring.  We will have to watch for recurrent atrial fibrillation closely.  2.  Paroxysmal atrial fibrillation: Currently sinus bradycardia as above.  Status post fall with significant facial bruising, C5-6 fracture, small scalp hematoma without fracture or intracranial hemorrhage, and distal right nasal bone fracture.  Continue low-dose Eliquis with a low threshold to discontinue if worsening swelling/bleeding/anemia noted.  As above, diltiazem has been discontinued.  3.  Fall/rhabdomyolysis: Per report, patient lost her balance and fell forward.  No complaints of presyncope or syncope.  CK elevated at 861 in the setting of approximately 6 hours of downtime.  Continue IV fluids and watch for signs of volume excess in the setting of prior history of HFpEF.  4.  Chronic diastolic congestive heart failure: Euvolemic currently.  Watch volume status closely in the setting of IV fluids and rhabdomyolysis.  5.  Stage IV chronic kidney disease: Creatinine currently stable.  Follow.  6.  C5-6 spinous process fracture: Per medicine and neurosurgery.  Plan for conservative care.  7.  Severe right shoulder pain: No humeral fracture noted.  Pain management per internal medicine.  Signed, Murray Hodgkins, NP 09/26/2018, 5:19 PM  For questions or updates, please contact   Please consult [email or web address] for contact info under Cardiology/STEMI.

## 2018-09-26 NOTE — Consult Note (Signed)
Referring Physician:  No referring provider defined for this encounter.  Primary Physician:  [name], MD  Chief Complaint: Fall  History of Present Illness: Savannah Wilson is a 85 y.o. female who presents as a consult for C5 spinous process fracture that resulted after a fall.  Unable to obtain thorough history due to significant pain in posterior cervical spine and shoulders.  Complains of allover arm pain bilaterally right greater than left, as well as right lower extremity pain.  Complaints circumferential.  Denies any numbness or tingling in upper or lower extremities.   The symptoms are causing a significant impact on the patient's life.   Review of Systems:  A 10 point review of systems is negative, except for the pertinent positives and negatives detailed in the HPI.  Past Medical History: Past Medical History:  Diagnosis Date  . (HFpEF) heart failure with preserved ejection fraction ([place])    a. 08/2018 Echo: EF 55-60%, no rwma, Mild MR, mod dil LA. Nl RV fxn.  . Asthma   . Barrett esophagus   . Chronic bronchitis   . Chronic kidney disease (CKD), stage IV (severe) ([place])   . Depression   . Diverticulosis   . GERD (gastroesophageal reflux disease)   . Gout   . History of stress test    a. 04/2017 MV: prior inf/inflat infarct w/ peri-infarct ischemia. EF 55-65%. Sev hypotension w/ lexiscan.  . Hyperlipidemia   . Hypertension   . Hypothyroid   . Mitral regurgitation    a. TTE 02/2017: EF 55-60%, normal WM, calcified mitral annulus with mod regurg, moderately dilated LA; b. 08/2018 Echo: Mild MR.  . Pancreatitis due to biliary obstruction March 2010   s/p ERCP sphincterotomy, cholecystectomy  . Peripheral neuralgia   . Persistent atrial fibrillation    a. diagnosed 01/24/17-> CHADS2VASc => 5 (HTN, age x 2, vascular disease with PAD and aortic plaque, female) giving her an estimated annual stroke risk of 6.7%->Eliquis; b. successful DCCV 03/28/2017; c. BB d/c'd 2/2  bradycardia; d. 08/2018 Recurrent Afib-> DCCV-> dilt started.  . Venous insufficiency     Past Surgical History: Past Surgical History:  Procedure Laterality Date  . ABDOMINAL HYSTERECTOMY    . CARDIOVERSION N/A 03/28/2017   Procedure: CARDIOVERSION;  Surgeon: Nelva Bush, MD;  Location: [facility];  Service: Cardiovascular;  Laterality: N/A;  . CARDIOVERSION N/A 09/14/2018   Procedure: CARDIOVERSION;  Surgeon: Minna Merritts, MD;  Location: [facility];  Service: Cardiovascular;  Laterality: N/A;  . CARPAL TUNNEL RELEASE  jan 2013   [name]  . CATARACT EXTRACTION     right  . CATARACT EXTRACTION  2008  . CHOLECYSTECTOMY  02/10  . CHOLECYSTECTOMY  2010  . TONSILLECTOMY    . VENTRAL HERNIA REPAIR    . VENTRAL HERNIA REPAIR  2007  . VESICOVAGINAL FISTULA CLOSURE W/ TAH      Allergies: Allergies as of 09/26/2018 - Review Complete 09/26/2018  Allergen Reaction Noted  . Promethazine Anxiety, Palpitations, and Other (See Comments) 09/13/2018  . Ace inhibitors Cough 11/04/2010  . Benicar [olmesartan medoxomil]  11/04/2010  . Clarithromycin Other (See Comments) 11/04/2010  . Clarithromycin Other (See Comments) 11/05/2010  . Norvasc [amlodipine besylate]  11/04/2010  . Levofloxacin Rash 11/04/2010  . Zithromax [azithromycin dihydrate] Rash 11/04/2010    Medications:  Current Facility-Administered Medications:  .  apixaban (ELIQUIS) tablet 2.5 mg, 2.5 mg, Oral, BID, Hallaji, Sheema M, RPH .  DOPamine (INTROPIN) 800 mg in dextrose 5 % 250 mL (  3.2 mg/mL) infusion, 0-20 mcg/kg/min, Intravenous, Titrated, Theora Gianotti, NP, Last Rate: 5.62 mL/hr at 09/26/18 1725, 5 mcg/kg/min at 09/26/18 1725 .  morphine 2 MG/ML injection 2 mg, 2 mg, Intravenous, Q4H PRN, Gouru, Aruna, MD .  ondansetron (ZOFRAN) tablet 4 mg, 4 mg, Oral, Q6H PRN **OR** ondansetron (ZOFRAN) injection 4 mg, 4 mg, Intravenous, Q6H PRN, Gouru, Aruna, MD .  traMADol (ULTRAM) tablet 50 mg, 50 mg, Oral, Q6H PRN,  Gouru, Aruna, MD, 50 mg at 09/26/18 1810  Current Outpatient Medications:  .  apixaban (ELIQUIS) 2.5 MG TABS tablet, Take 1 tablet (2.5 mg total) by mouth 2 (two) times daily., Disp: 180 tablet, Rfl: 3 .  budesonide (ENTOCORT EC) 3 MG 24 hr capsule, Take 1 capsule (3 mg total) by mouth daily., Disp: 30 capsule, Rfl: 0 .  budesonide (PULMICORT) 0.25 MG/2ML nebulizer solution, USE ONE (1) VIAL IN NEBULIZER TWO TIMES PER DAY, Disp: 120 mL, Rfl: 0 .  Cholecalciferol (VITAMIN D-3) 1000 units CAPS, Take 1,000 Units by mouth daily. , Disp: , Rfl:  .  diltiazem (CARDIZEM CD) 180 MG 24 hr capsule, Take 1 capsule (180 mg total) by mouth daily., Disp: 30 capsule, Rfl: 1 .  escitalopram (LEXAPRO) 10 MG tablet, Take 1 tablet (10 mg total) by mouth daily. After dinner, Disp: 90 tablet, Rfl: 1 .  furosemide (LASIX) 40 MG tablet, Take 40 mg by mouth daily. , Disp: , Rfl:  .  ipratropium-albuterol (DUONEB) 0.5-2.5 (3) MG/3ML SOLN, Take 3 mLs by nebulization every 6 (six) hours as needed., Disp: 360 mL, Rfl: 0 .  levothyroxine (SYNTHROID, LEVOTHROID) 100 MCG tablet, TAKE 1 TABLET BY MOUTH DAILY FOR THYROID, Disp: 90 tablet, Rfl: 0 .  losartan (COZAAR) 50 MG tablet, TAKE ONE TABLET EVERY DAY, Disp: 90 tablet, Rfl: 0 .  omeprazole (PRILOSEC) 40 MG capsule, TAKE ONE CAPSULE BY MOUTH TWICE A DAY. BEFORE BREAKFAST AND SUPPER, Disp: 180 capsule, Rfl: 1 .  vitamin B-12 (CYANOCOBALAMIN) 1000 MCG tablet, Take 1,000 mcg by mouth daily., Disp: , Rfl:  .  acetaminophen (TYLENOL) 650 MG CR tablet, Take 650 mg by mouth every 8 (eight) hours as needed for pain. , Disp: , Rfl:  .  ALPRAZolam (XANAX) 0.5 MG tablet, Take 1 tablet (0.5 mg total) by mouth 2 (two) times daily as needed for anxiety., Disp: 45 tablet, Rfl: 5 .  benzonatate (TESSALON) 200 MG capsule, Take 1 capsule (200 mg total) by mouth 3 (three) times daily as needed for cough. (Patient not taking: Reported on 09/26/2018), Disp: 60 capsule, Rfl: 1 .  cetirizine (ZYRTEC  ALLERGY) 10 MG tablet, Take 10 mg by mouth daily., Disp: , Rfl:  .  docusate sodium (COLACE) 100 MG capsule, Take 1 capsule (100 mg total) by mouth 2 (two) times daily as needed for mild constipation., Disp: 10 capsule, Rfl: 0 .  fluticasone (FLONASE) 50 MCG/ACT nasal spray, Place 2 sprays into the nose daily. (Patient taking differently: Place 2 sprays into the nose daily as needed (For allergies.). ), Disp: 16 g, Rfl: 6 .  loperamide (IMODIUM) 2 MG capsule, Take 1 capsule (2 mg total) by mouth as needed for diarrhea or loose stools., Disp: 30 capsule, Rfl: 0 .  mupirocin ointment (BACTROBAN) 2 %, Place 1 application into the nose 2 (two) times daily., Disp: 22 g, Rfl: 0 .  ondansetron (ZOFRAN ODT) 4 MG disintegrating tablet, Take 1 tablet (4 mg total) by mouth every 8 (eight) hours as needed for nausea or vomiting., Disp:  12 tablet, Rfl: 0 .  PROAIR HFA 108 (90 BASE) MCG/ACT inhaler, INHALE 2 PUFFS EVERY 6 HOURS AS NEEDED FOR WHEEZING, Disp: 8.5 g, Rfl: 3 .  rOPINIRole (REQUIP) 0.25 MG tablet, TAKE ONE TABLET 3 TIMES DAILY (Patient not taking: Reported on 09/26/2018), Disp: 90 tablet, Rfl: 0 .  Spacer/Aero-Holding Chambers (OPTICHAMBER ADVANTAGE) MISC, 1 each by Other route once. Always uses her when you're using a metered-dose inhaler. You've aromatase medicine as much, he won't have his much side effect, but you it twice as much medicine and your lungs., Disp: 1 each, Rfl: 0 .  temazepam (RESTORIL) 15 MG capsule, TAKE 1 CAPSULE BY MOUTH AT BEDTIME AS NEEDED FOR SLEEP, Disp: 30 capsule, Rfl: 5   Social History: Social History   Tobacco Use  . Smoking status: Never Smoker  . Smokeless tobacco: Never Used  Substance Use Topics  . Alcohol use: No    Comment: rare  . Drug use: No    Family Medical History: Family History  Problem Relation Age of Onset  . Kidney failure Mother   . Hypertension Mother   . Kidney disease Mother   . Multiple myeloma Daughter   . Cancer Daughter         multiple myeloma  . Kidney disease Daughter   . Heart disease Father   . Rheumatologic disease Father   . Kidney cancer Father     Physical Examination: Vitals:   09/26/18 1800 09/26/18 1830  BP: 134/62 (!) 142/55  Pulse: (!) 47 60  Resp: 14 17  Temp:    SpO2: 98% 97%     General: Patient is well developed, well nourished, calm, collected, and in no apparent distress.  Psychiatric: Patient is non-anxious.  ENT:  Oral mucosa appears well hydrated.  Neck:   Supple.  Full range of motion.  Respiratory: Patient is breathing without any difficulty.  Extremities: No edema.  Vascular: Palpable pulses in dorsal pedal vessels.  Skin:   Multiple areas of skin tears.  Right eye swollen shut.  Facial ecchymosis  NEUROLOGICAL:  General: In moderate distress. Awake, alert, oriented to person, place, and time.  Pupils equal round and reactive to light.  Facial tone is symmetric.   Palpation of spine: Tenderness to palpation posterior mid cervical spine  Strength: Unable to obtain adequate exam due to pain limitations  Reflexes are 2+ and symmetric at the biceps, triceps, brachioradialis, patella and achilles.   Bilateral upper and lower extremity sensation is intact to light touch and pin prick.  Clonus is not present. Hoffman's is absent.  Imaging: EXAM: CT HEAD WITHOUT CONTRAST  CT MAXILLOFACIAL WITHOUT CONTRAST  CT CERVICAL SPINE WITHOUT CONTRAST  09/26/2018  TECHNIQUE: Multidetector CT imaging of the head, cervical spine, and maxillofacial structures were performed using the standard protocol without intravenous contrast. Multiplanar CT image reconstructions of the cervical spine and maxillofacial structures were also generated. Right side of face marked with BB.  COMPARISON:  CT head 09/12/2018 CT cervical spine 09/11/2018  FINDINGS: CT HEAD FINDINGS  Brain: Generalized atrophy. Normal ventricular morphology. No midline shift or mass effect. Small vessel  chronic ischemic changes of deep cerebral white matter. No intracranial hemorrhage, mass lesion, evidence of acute infarction, or extra-axial fluid collection.  Vascular: No hyperdense vessels. Atherosclerotic calcifications of internal carotid arteries at skull base.  Skull: Frontal scalp hematoma.  Calvaria intact.  Other: N/A  CT MAXILLOFACIAL FINDINGS  Osseous: Minimally displaced distal RIGHT nasal bone fracture. Scattered beam hardening artifacts of dental  origin. TMJ alignment normal. Bony orbits and zygomas intact. No additional facial bone fractures identified.  Orbits: Bony orbits intact. Intraorbital soft tissue planes clear bilaterally without fluid or pneumatosis  Sinuses: Paranasal sinuses, mastoid air cells and middle ear cavities clear bilaterally  Soft tissues: Frontal scalp hematoma extending RIGHT periorbital. Remaining facial soft tissues unremarkable.  CT CERVICAL SPINE FINDINGS  Alignment: Mild anterolisthesis C3-C4. Minimal retrolisthesis C4-C5 and C5-C6. Remaining alignments normal  Skull base and vertebrae: Osseous demineralization. Multilevel facet degenerative changes. Disc space narrowing and endplate spur formation C4-C5 through C6-C7. Vertebral body heights maintained. Fractured spinous processes into her RIGHT lamina at C5 and at spinous process of C6. Uncovertebral spurs encroach upon the C5-C6 neural foramina bilaterally less at C4-C5 on LEFT.  Soft tissues and spinal canal: Prevertebral soft tissues normal thickness. Atherosclerotic calcifications at the carotid bifurcations bilaterally. Remaining cervical soft tissues unremarkable.  Disc levels: Significant endplate spur formation and uncovertebral spurring narrow the LEFT C5-C6 neural foramen and also flattening the LEFT ventral aspect of the thecal sac and spinal cord.  Upper chest: Small RIGHT pleural effusion. Lung apices otherwise clear.  Other:  N/A  IMPRESSION: Atrophy with small vessel chronic ischemic changes of deep cerebral white matter.  No acute intracranial abnormalities.  Fractured spinous process C6.  Fractured spinous process C5 into RIGHT lamina of C5.  Multilevel degenerative disc and facet disease changes of the cervical spine.  Compression of the LEFT ventral aspect of the thecal sac and spinal cord at C5-C6 by endplate and uncovertebral spur formation.  Minimally displaced distal RIGHT nasal bone fracture.  No other facial bone fractures identified.  Findings called to Dr.  Joni Fears on 09/26/2018 at 1342 hours.   Electronically Signed   By: Lavonia Dana M.D.   On: 09/26/2018 13:42   Assessment and Plan: Ms. Markovic is a pleasant 85 y.o. female with C5 spinous process fracture that resulted after a fall today.  Surgical intervention not recommended at this time due to stability of fracture.  Recommend collar for comfort care.  She may follow-up in clinic in approximately 4 weeks to monitor progress.  Marin Olp, PA-C Dept. of Neurosurgery

## 2018-09-26 NOTE — ED Notes (Signed)
c collar placed on pt

## 2018-09-26 NOTE — ED Notes (Signed)
1st attempt to call report to ICU. Receiving nurse busy accepting another patient. Will call back shortly to take report.

## 2018-09-26 NOTE — Progress Notes (Signed)
Family Meeting Note  Advance Directive:yes  Today a meeting took place with the Patient.    The following clinical team members were present during this meeting:MD  The following were discussed:Patient's diagnosis: Acute rhabdomyolysis, mechanical fall, C5-C6 spinous process fracture, chronic kidney disease stage IV, hypertension, hypothyroidism, chronic atrial fibrillation on Eliquis elevated CK and troponin, will be admitted to the hospital.  Neurosurgery consulted who has recommended outpatient follow-up and PT evaluation.  Plan of care discussed in detail with the patient and her stepdaughter at bedside.  Discussed with Savannah Wilson patient's healthcare power of attorney over phone    patient's progosis: Unable to determine and Goals for treatment: DNR  Daughter Savannah Wilson is a healthcare POA  Additional follow-up to be provided: Hospitalist  Time spent during discussion:17 MIN  [name], MD

## 2018-09-26 NOTE — Progress Notes (Signed)
eLink Physician-Brief Progress Note Patient Name: Savannah Wilson DOB: 1929/05/04 MRN: [id number]   Date of Service  09/26/2018  HPI/Events of Note  85 yr old female admitted to ICU for 1). Mechanical fall with C5-6 spinous #, conservative Rx as per Neuro surgery, no other #, CTH neg.  2) Rhabdo mild. CKD.  3) Bradycardia. A fib paroxysmal on apixaban. ssen by cards. On low dose dopamine gtt.  Stable HFpEF.  4) Chronic anemia. Code: DNR. Data, Camera assessment done. Resting. Looks comfortable. MAP 69, HR 60, sats 96%.   eICU Interventions  - continue care - on apixaban - on dopa gtt. - fall precautions.      Intervention Category Major Interventions: Arrhythmia - evaluation and management Evaluation Type: New Patient Evaluation  Elmer Sow 09/26/2018, 10:51 PM

## 2018-09-26 NOTE — ED Provider Notes (Signed)
[facility] Emergency Department Provider Note  ____________________________________________  Time seen: Approximately 2:46 PM  I have reviewed the triage vital signs and the nursing notes.   HISTORY  Chief Complaint Fall    HPI Savannah Wilson is a 85 y.o. female with a history of Barrett's esophagus, diverticulosis, hypertension, atrial fibrillation who complains of a mechanical fall at 5:00 AM today.  She was try to get out of bed and putting on her robe to go to the bathroom when she lost her balance and fell over her walker onto the floor face first.  She was stuck in that position and unable to get herself up until her daughter came later this morning to check on her.  Daughter called EMS who brought the patient to the ED.  Patient complains of severe neck pain that is nonradiating.  Denies paresthesias or arm weakness.  No vision changes or headache.  She does also report right hip pain that is nonradiating.      Past Medical History:  Diagnosis Date  . Asthma   . Barrett esophagus   . Chronic bronchitis   . Chronic kidney disease (CKD), stage IV (severe) ([place])   . Depression   . Diverticulosis   . GERD (gastroesophageal reflux disease)   . Gout   . Hyperlipidemia   . Hypertension   . Hypothyroid   . Mitral regurgitation    a. TTE 02/2017: EF 55-60%, normal WM, calcified mitral annulus with mod regurg, moderately dilated LA, unable to estimate PASP  . Pancreatitis due to biliary obstruction March 2010   s/p ERCP sphincterotomy, cholecystectomy  . Peripheral neuralgia   . Persistent atrial fibrillation    a. diagnosed 01/24/17; CHADS2VASc => 5 (HTN, age x 2, vascular disease with PAD and aortic plaque, female) giving her an estimated annual stroke risk of 6.7%; b. successful DCCV 03/28/2017; c. on Eliquis  . Venous insufficiency      Patient Active Problem List   Diagnosis Date Noted  . Nausea vomiting and diarrhea 09/13/2018  . Insomnia due to  anxiety and fear 05/03/2018  . Pneumonia 08/14/2017  . Hospital discharge follow-up 05/06/2017  . CHF exacerbation ([place]) 04/15/2017  . Atrial fibrillation with RVR ([place]) 01/25/2017  . Grief at loss of child 01/25/2017  . Atherosclerosis of aorta ([place]) 07/26/2016  . Angioedema 12/06/2015  . Routine general medical examination at a health care facility 07/25/2015  . Bilateral hand numbness 01/29/2015  . Urinary incontinence, mixed 01/29/2015  . Counseling regarding advanced directives and goals of care 01/29/2015  . Medicare annual wellness visit, subsequent 08/09/2014  . Hypothyroidism 10/06/2013  . Hiatal hernia with gastroesophageal reflux 09/24/2013  . Zenker's diverticulum 09/24/2013  . Anxiety state 07/05/2013  . Abdominal pain 06/03/2013  . Need for prophylactic vaccination and inoculation against influenza 05/07/2013  . Allergic rhinitis 01/11/2013  . Dyspnea on exertion 01/11/2013  . Bradycardia, drug induced 08/26/2012  . Renal cysts, acquired, bilateral 08/26/2012  . Anemia in chronic kidney disease (CKD) 06/24/2012  . Neuropathy 05/22/2012  . History of pancreatitis   . Hypertension 09/15/2011  . Restless legs syndrome (RLS) 09/15/2011  . Asthma   . Depression, major, single episode, moderate ([place])   . Venous insufficiency   . Barrett esophagus   . CKD (chronic kidney disease), stage IV [facility])      Past Surgical History:  Procedure Laterality Date  . ABDOMINAL HYSTERECTOMY    . CARDIOVERSION N/A 03/28/2017   Procedure: CARDIOVERSION;  Surgeon: End,  Harrell Gave, MD;  Location: [facility];  Service: Cardiovascular;  Laterality: N/A;  . CARDIOVERSION N/A 09/14/2018   Procedure: CARDIOVERSION;  Surgeon: Minna Merritts, MD;  Location: [facility];  Service: Cardiovascular;  Laterality: N/A;  . CARPAL TUNNEL RELEASE  jan 2013   [name]  . CATARACT EXTRACTION     right  . CATARACT EXTRACTION  2008  . CHOLECYSTECTOMY  02/10  . CHOLECYSTECTOMY  2010  . TONSILLECTOMY     . VENTRAL HERNIA REPAIR    . VENTRAL HERNIA REPAIR  2007  . VESICOVAGINAL FISTULA CLOSURE W/ TAH       Prior to Admission medications   Medication Sig Start Date End Date Taking? Authorizing Provider  acetaminophen (TYLENOL) 650 MG CR tablet Take 650 mg by mouth every 8 (eight) hours as needed for pain.     [provider]  ALPRAZolam Duanne Moron) 0.5 MG tablet Take 1 tablet (0.5 mg total) by mouth 2 (two) times daily as needed for anxiety. 02/07/18   [name], MD  apixaban (ELIQUIS) 2.5 MG TABS tablet Take 1 tablet (2.5 mg total) by mouth 2 (two) times daily. 02/12/18   Wellington Hampshire, MD  benzonatate (TESSALON) 200 MG capsule Take 1 capsule (200 mg total) by mouth 3 (three) times daily as needed for cough. 08/27/18   [name], MD  budesonide (ENTOCORT EC) 3 MG 24 hr capsule Take 1 capsule (3 mg total) by mouth daily. 09/18/18   [name], MD  budesonide (PULMICORT) 0.25 MG/2ML nebulizer solution USE ONE (1) VIAL IN NEBULIZER TWO TIMES PER DAY 08/21/18   [name], MD  cetirizine (ZYRTEC ALLERGY) 10 MG tablet Take 10 mg by mouth daily.    [provider]  Cholecalciferol (VITAMIN D-3) 1000 units CAPS Take 1,000 Units by mouth daily.     [provider]  diltiazem (CARDIZEM CD) 180 MG 24 hr capsule Take 1 capsule (180 mg total) by mouth daily. 09/16/18 11/15/18  [name], MD  docusate sodium (COLACE) 100 MG capsule Take 1 capsule (100 mg total) by mouth 2 (two) times daily as needed for mild constipation. 08/26/18   Epifanio Lesches, MD  escitalopram (LEXAPRO) 10 MG tablet Take 1 tablet (10 mg total) by mouth daily. After dinner 01/29/18   [name], MD  fluticasone (FLONASE) 50 MCG/ACT nasal spray Place 2 sprays into the nose daily. Patient taking differently: Place 2 sprays into the nose daily as needed (For allergies.).  01/30/13   [name], MD  furosemide (LASIX) 40 MG tablet Take 80 mg by mouth daily.     [provider]  ipratropium-albuterol (DUONEB) 0.5-2.5 (3) MG/3ML SOLN Take 3 mLs by nebulization every 6 (six) hours as needed. 08/27/18   [name], MD  levothyroxine (SYNTHROID, LEVOTHROID) 100 MCG tablet TAKE 1 TABLET BY MOUTH DAILY FOR THYROID 09/10/18   [name], MD  loperamide (IMODIUM) 2 MG capsule Take 1 capsule (2 mg total) by mouth as needed for diarrhea or loose stools. 09/15/18   [name], MD  losartan (COZAAR) 50 MG tablet TAKE ONE TABLET EVERY DAY 08/09/18   [name], MD  mupirocin ointment (BACTROBAN) 2 % Place 1 application into the nose 2 (two) times daily. 05/01/18   [name], MD  omeprazole (PRILOSEC) 40 MG capsule TAKE ONE CAPSULE BY MOUTH TWICE A DAY. BEFORE BREAKFAST AND SUPPER 03/13/18   [name], MD  ondansetron [facility]  ODT) 4 MG disintegrating tablet Take 1 tablet (4 mg total) by mouth every 8 (eight) hours as needed for nausea or vomiting. 09/12/18   Nena Polio, MD  PROAIR HFA 108 [phone] BASE) MCG/ACT inhaler INHALE 2 PUFFS EVERY 6 HOURS AS NEEDED FOR WHEEZING 03/13/15   [name], MD  rOPINIRole (REQUIP) 0.25 MG tablet TAKE ONE TABLET 3 TIMES DAILY 07/26/18   [name], MD  Spacer/Aero-Holding Chambers [facility] ADVANTAGE) MISC 1 each by Other route once. Always uses her when you're using a metered-dose inhaler. You've aromatase medicine as much, he won't have his much side effect, but you it twice as much medicine and your lungs. 06/08/15   Ahmed Prima, MD  temazepam (RESTORIL) 15 MG capsule TAKE 1 CAPSULE BY MOUTH AT BEDTIME AS NEEDED FOR SLEEP 06/13/18   [name], MD  vitamin B-12 (CYANOCOBALAMIN) 1000 MCG tablet Take 1,000 mcg by mouth daily.    [provider]     Allergies Promethazine; Ace inhibitors; Benicar [olmesartan medoxomil]; Clarithromycin; Clarithromycin; Norvasc [amlodipine besylate]; Levofloxacin; and Zithromax [azithromycin dihydrate]   Family History  Problem Relation Age of Onset  . Kidney  failure Mother   . Hypertension Mother   . Kidney disease Mother   . Multiple myeloma Daughter   . Cancer Daughter        multiple myeloma  . Kidney disease Daughter   . Heart disease Father   . Rheumatologic disease Father   . Kidney cancer Father     Social History Social History   Tobacco Use  . Smoking status: Never Smoker  . Smokeless tobacco: Never Used  Substance Use Topics  . Alcohol use: No    Comment: rare  . Drug use: No    Review of Systems  Constitutional:   No fever or chills.  ENT:   No sore throat. No rhinorrhea. Cardiovascular:   No chest pain or syncope. Respiratory:   No dyspnea or cough. Gastrointestinal:   Negative for abdominal pain, vomiting and diarrhea.  Musculoskeletal:   Right hip pain, neck pain.  Right arm pain diffusely. All other systems reviewed and are negative except as documented above in ROS and HPI.  ____________________________________________   PHYSICAL EXAM:  VITAL SIGNS: ED Triage Vitals  Enc Vitals Group     BP 09/26/18 1206 102/60     Pulse Rate 09/26/18 1206 (!) 42     Resp 09/26/18 1206 16     Temp 09/26/18 1206 98.2 F (36.8 C)     Temp Source 09/26/18 1206 Oral     SpO2 09/26/18 1206 97 %     Weight 09/26/18 1207 132 lb (59.9 kg)     Height 09/26/18 1207 5' (1.524 m)     Head Circumference --      Peak Flow --      Pain Score 09/26/18 1207 8     Pain Loc --      Pain Edu? --      Excl. in [place]? --     Vital signs reviewed, nursing assessments reviewed.   Constitutional:   Alert and oriented.  Ill-appearing Eyes:   Conjunctivae are normal. EOMI. PERRL. ENT      Head:   Normocephalic with periorbital ecchymosis bilaterally concerning for raccoon eyes.  No battle sign otorrhea or hemotympanum..      Nose:   No congestion/rhinnorhea.       Mouth/Throat:   Mucous membranes.  Dried blood around the lips.  No obvious dental injury or tongue laceration, no pharyngeal erythema. No peritonsillar mass.       Neck:    Midline tenderness at the middle of the neck.  Cervical brace in place.. Hematological/Lymphatic/Immunilogical:   No cervical lymphadenopathy. Cardiovascular:   Bradycardia heart rate 40. Symmetric bilateral radial and DP pulses.  No murmurs. Cap refill less than 2 seconds. Respiratory:   Normal respiratory effort without tachypnea/retractions. Breath sounds are clear and equal bilaterally. No wheezes/rales/rhonchi. Gastrointestinal:   Soft and nontender. Non distended. There is no CVA tenderness.  No rebound, rigidity, or guarding.  Musculoskeletal: Pain with range of motion of the right shoulder.  Nonfocal but there is tenderness diffusely in the right upper arm and forearm.  No joint tenderness or deformity.  Left upper extremities unremarkable with intact range of motion.  Left lower extremities unremarkable with intact range of motion.    Right lower extremity has some tenderness at the right hip and pain with hip flexion. Neurologic:   Normal speech and language.  Motor grossly intact. No acute focal neurologic deficits are appreciated.  Skin:    Skin is warm, dry and intact. No rash noted.  No petechiae, purpura, or bullae.  ____________________________________________    LABS (pertinent positives/negatives) (all labs ordered are listed, but only abnormal results are displayed) Labs Reviewed  BASIC METABOLIC PANEL - Abnormal; Notable for the following components:      Result Value   Glucose, Bld 127 (*)    BUN 35 (*)    Creatinine, Ser 1.53 (*)    Calcium 8.4 (*)    GFR calc non Af Amer 30 (*)    GFR calc Af Amer 35 (*)    All other components within normal limits  CBC WITH DIFFERENTIAL/PLATELET - Abnormal; Notable for the following components:   RBC 3.45 (*)    Hemoglobin 9.5 (*)    HCT 32.0 (*)    MCHC 29.7 (*)    RDW 17.1 (*)    Neutro Abs 9.1 (*)    All other components within normal limits  TROPONIN I - Abnormal; Notable for the following components:   Troponin I  0.04 (*)    All other components within normal limits  CK - Abnormal; Notable for the following components:   Total CK 861 (*)    All other components within normal limits  URINE CULTURE  URINALYSIS, COMPLETE (UACMP) WITH MICROSCOPIC   ____________________________________________   EKG    ____________________________________________    RADIOLOGY  Dg Chest 1 View  Result Date: 09/26/2018 CLINICAL DATA:  Unwitnessed fall this morning. The patient was found down. EXAM: CHEST  1 VIEW COMPARISON:  09/11/2018, 08/27/2018 and 09/11/2017 and CT scan of the abdomen dated 08/23/2018 FINDINGS: Heart size and pulmonary vascularity are normal. Aortic atherosclerosis. Large hiatal hernia. The left pleural effusion has resolved. Small right pleural effusion has diminished. No acute bone abnormality. IMPRESSION: 1. Improved right effusion.  Resolved left effusion. 2.  Aortic Atherosclerosis (ICD10-I70.0). 3. No acute abnormalities. 4. Large hiatal hernia, chronic. Electronically Signed   By: Lorriane Shire M.D.   On: 09/26/2018 13:58   Dg Forearm Right  Result Date: 09/26/2018 CLINICAL DATA:  Right arm pain secondary to a fall at home this morning. EXAM: RIGHT FOREARM - 2 VIEW COMPARISON:  None. FINDINGS: There is no fracture or dislocation. Slight calcific tendinopathy at the lateral epicondyle of the distal humerus. Chronic degenerative changes at the right wrist with chronic scapholunate dissociation due to a tear of  the scapholunate ligament. Severe arthritis of the first [facility] joint. Multiple phleboliths in the forearm. IMPRESSION: No acute abnormality.  Chronic degenerative changes as described. Electronically Signed   By: Lorriane Shire M.D.   On: 09/26/2018 13:53   Ct Head Wo Contrast  Result Date: 09/26/2018 CLINICAL DATA:  Unwitnessed fall at home this morning at 0500 hours, laid on floor until EMS was called, fell onto face, unknown loss of consciousness, neck pain, history hypertension,  atrial fibrillation, stage IV chronic kidney disease, CHF EXAM: CT HEAD WITHOUT CONTRAST CT MAXILLOFACIAL WITHOUT CONTRAST CT CERVICAL SPINE WITHOUT CONTRAST TECHNIQUE: Multidetector CT imaging of the head, cervical spine, and maxillofacial structures were performed using the standard protocol without intravenous contrast. Multiplanar CT image reconstructions of the cervical spine and maxillofacial structures were also generated. Right side of face marked with BB. COMPARISON:  CT head 09/12/2018 CT cervical spine 09/11/2018 FINDINGS: CT HEAD FINDINGS Brain: Generalized atrophy. Normal ventricular morphology. No midline shift or mass effect. Small vessel chronic ischemic changes of deep cerebral white matter. No intracranial hemorrhage, mass lesion, evidence of acute infarction, or extra-axial fluid collection. Vascular: No hyperdense vessels. Atherosclerotic calcifications of internal carotid arteries at skull base. Skull: Frontal scalp hematoma.  Calvaria intact. Other: N/A CT MAXILLOFACIAL FINDINGS Osseous: Minimally displaced distal RIGHT nasal bone fracture. Scattered beam hardening artifacts of dental origin. TMJ alignment normal. Bony orbits and zygomas intact. No additional facial bone fractures identified. Orbits: Bony orbits intact. Intraorbital soft tissue planes clear bilaterally without fluid or pneumatosis Sinuses: Paranasal sinuses, mastoid air cells and middle ear cavities clear bilaterally Soft tissues: Frontal scalp hematoma extending RIGHT periorbital. Remaining facial soft tissues unremarkable. CT CERVICAL SPINE FINDINGS Alignment: Mild anterolisthesis C3-C4. Minimal retrolisthesis C4-C5 and C5-C6. Remaining alignments normal Skull base and vertebrae: Osseous demineralization. Multilevel facet degenerative changes. Disc space narrowing and endplate spur formation C4-C5 through C6-C7. Vertebral body heights maintained. Fractured spinous processes into her RIGHT lamina at C5 and at spinous process  of C6. Uncovertebral spurs encroach upon the C5-C6 neural foramina bilaterally less at C4-C5 on LEFT. Soft tissues and spinal canal: Prevertebral soft tissues normal thickness. Atherosclerotic calcifications at the carotid bifurcations bilaterally. Remaining cervical soft tissues unremarkable. Disc levels: Significant endplate spur formation and uncovertebral spurring narrow the LEFT C5-C6 neural foramen and also flattening the LEFT ventral aspect of the thecal sac and spinal cord. Upper chest: Small RIGHT pleural effusion. Lung apices otherwise clear. Other: N/A IMPRESSION: Atrophy with small vessel chronic ischemic changes of deep cerebral white matter. No acute intracranial abnormalities. Fractured spinous process C6. Fractured spinous process C5 into RIGHT lamina of C5. Multilevel degenerative disc and facet disease changes of the cervical spine. Compression of the LEFT ventral aspect of the thecal sac and spinal cord at C5-C6 by endplate and uncovertebral spur formation. Minimally displaced distal RIGHT nasal bone fracture. No other facial bone fractures identified. Findings called to Dr.  Joni Fears on 09/26/2018 at 1342 hours. Electronically Signed   By: Lavonia Dana M.D.   On: 09/26/2018 13:42   Ct Cervical Spine Wo Contrast  Result Date: 09/26/2018 CLINICAL DATA:  Unwitnessed fall at home this morning at 0500 hours, laid on floor until EMS was called, fell onto face, unknown loss of consciousness, neck pain, history hypertension, atrial fibrillation, stage IV chronic kidney disease, CHF EXAM: CT HEAD WITHOUT CONTRAST CT MAXILLOFACIAL WITHOUT CONTRAST CT CERVICAL SPINE WITHOUT CONTRAST TECHNIQUE: Multidetector CT imaging of the head, cervical spine, and maxillofacial structures were performed using the standard protocol without  intravenous contrast. Multiplanar CT image reconstructions of the cervical spine and maxillofacial structures were also generated. Right side of face marked with BB. COMPARISON:  CT  head 09/12/2018 CT cervical spine 09/11/2018 FINDINGS: CT HEAD FINDINGS Brain: Generalized atrophy. Normal ventricular morphology. No midline shift or mass effect. Small vessel chronic ischemic changes of deep cerebral white matter. No intracranial hemorrhage, mass lesion, evidence of acute infarction, or extra-axial fluid collection. Vascular: No hyperdense vessels. Atherosclerotic calcifications of internal carotid arteries at skull base. Skull: Frontal scalp hematoma.  Calvaria intact. Other: N/A CT MAXILLOFACIAL FINDINGS Osseous: Minimally displaced distal RIGHT nasal bone fracture. Scattered beam hardening artifacts of dental origin. TMJ alignment normal. Bony orbits and zygomas intact. No additional facial bone fractures identified. Orbits: Bony orbits intact. Intraorbital soft tissue planes clear bilaterally without fluid or pneumatosis Sinuses: Paranasal sinuses, mastoid air cells and middle ear cavities clear bilaterally Soft tissues: Frontal scalp hematoma extending RIGHT periorbital. Remaining facial soft tissues unremarkable. CT CERVICAL SPINE FINDINGS Alignment: Mild anterolisthesis C3-C4. Minimal retrolisthesis C4-C5 and C5-C6. Remaining alignments normal Skull base and vertebrae: Osseous demineralization. Multilevel facet degenerative changes. Disc space narrowing and endplate spur formation C4-C5 through C6-C7. Vertebral body heights maintained. Fractured spinous processes into her RIGHT lamina at C5 and at spinous process of C6. Uncovertebral spurs encroach upon the C5-C6 neural foramina bilaterally less at C4-C5 on LEFT. Soft tissues and spinal canal: Prevertebral soft tissues normal thickness. Atherosclerotic calcifications at the carotid bifurcations bilaterally. Remaining cervical soft tissues unremarkable. Disc levels: Significant endplate spur formation and uncovertebral spurring narrow the LEFT C5-C6 neural foramen and also flattening the LEFT ventral aspect of the thecal sac and spinal cord.  Upper chest: Small RIGHT pleural effusion. Lung apices otherwise clear. Other: N/A IMPRESSION: Atrophy with small vessel chronic ischemic changes of deep cerebral white matter. No acute intracranial abnormalities. Fractured spinous process C6. Fractured spinous process C5 into RIGHT lamina of C5. Multilevel degenerative disc and facet disease changes of the cervical spine. Compression of the LEFT ventral aspect of the thecal sac and spinal cord at C5-C6 by endplate and uncovertebral spur formation. Minimally displaced distal RIGHT nasal bone fracture. No other facial bone fractures identified. Findings called to Dr.  Joni Fears on 09/26/2018 at 1342 hours. Electronically Signed   By: Lavonia Dana M.D.   On: 09/26/2018 13:42   Ct Hip Right Wo Contrast  Result Date: 09/26/2018 CLINICAL DATA:  Right hip pain since an unwitnessed fall this morning. EXAM: CT OF THE RIGHT HIP WITHOUT CONTRAST TECHNIQUE: Multidetector CT imaging of the right hip was performed according to the standard protocol. Multiplanar CT image reconstructions were also generated. COMPARISON:  Radiographs dated 09/26/2018 FINDINGS: Bones/Joint/Cartilage There is no fracture or dislocation. Minimal marginal osteophytes on the right acetabulum. Slight calcifications at the gluteal tendon insertions on the right greater trochanter. No effusion. Muscles and Tendons Normal. Soft tissues No acute abnormality.  Sigmoid diverticulosis. IMPRESSION: No acute abnormality of the right hip. Electronically Signed   By: Lorriane Shire M.D.   On: 09/26/2018 14:35   Dg Humerus Right  Result Date: 09/26/2018 CLINICAL DATA:  Right arm pain due to an unwitnessed fall at home this morning. EXAM: RIGHT HUMERUS - 2+ VIEW COMPARISON:  None. FINDINGS: There is no fracture or dislocation. There is slight chronic changes at the lateral epicondyle of the distal humerus as well as calcific tendinopathy at the rotator cuff of the right shoulder. Slight degenerative changes of  the acromion with narrowing of the subacromial joint space consistent  with rotator cuff disease. IMPRESSION: No acute abnormality of the right humerus. Electronically Signed   By: Lorriane Shire M.D.   On: 09/26/2018 13:51   Dg Hip Unilat W Or Wo Pelvis 2-3 Views Right  Result Date: 09/26/2018 CLINICAL DATA:  Right hip pain after a fall at home this morning. EXAM: DG HIP (WITH OR WITHOUT PELVIS) 2-3V RIGHT COMPARISON:  None. FINDINGS: There is no evidence of hip fracture or dislocation. There is no evidence of significant arthropathy or other focal bone abnormality of the right hip. Medial joint space narrowing of the left hip. IMPRESSION: No acute abnormalities.  Negative right hip. Electronically Signed   By: Lorriane Shire M.D.   On: 09/26/2018 13:55   Ct Maxillofacial Wo Contrast  Result Date: 09/26/2018 CLINICAL DATA:  Unwitnessed fall at home this morning at 0500 hours, laid on floor until EMS was called, fell onto face, unknown loss of consciousness, neck pain, history hypertension, atrial fibrillation, stage IV chronic kidney disease, CHF EXAM: CT HEAD WITHOUT CONTRAST CT MAXILLOFACIAL WITHOUT CONTRAST CT CERVICAL SPINE WITHOUT CONTRAST TECHNIQUE: Multidetector CT imaging of the head, cervical spine, and maxillofacial structures were performed using the standard protocol without intravenous contrast. Multiplanar CT image reconstructions of the cervical spine and maxillofacial structures were also generated. Right side of face marked with BB. COMPARISON:  CT head 09/12/2018 CT cervical spine 09/11/2018 FINDINGS: CT HEAD FINDINGS Brain: Generalized atrophy. Normal ventricular morphology. No midline shift or mass effect. Small vessel chronic ischemic changes of deep cerebral white matter. No intracranial hemorrhage, mass lesion, evidence of acute infarction, or extra-axial fluid collection. Vascular: No hyperdense vessels. Atherosclerotic calcifications of internal carotid arteries at skull base. Skull:  Frontal scalp hematoma.  Calvaria intact. Other: N/A CT MAXILLOFACIAL FINDINGS Osseous: Minimally displaced distal RIGHT nasal bone fracture. Scattered beam hardening artifacts of dental origin. TMJ alignment normal. Bony orbits and zygomas intact. No additional facial bone fractures identified. Orbits: Bony orbits intact. Intraorbital soft tissue planes clear bilaterally without fluid or pneumatosis Sinuses: Paranasal sinuses, mastoid air cells and middle ear cavities clear bilaterally Soft tissues: Frontal scalp hematoma extending RIGHT periorbital. Remaining facial soft tissues unremarkable. CT CERVICAL SPINE FINDINGS Alignment: Mild anterolisthesis C3-C4. Minimal retrolisthesis C4-C5 and C5-C6. Remaining alignments normal Skull base and vertebrae: Osseous demineralization. Multilevel facet degenerative changes. Disc space narrowing and endplate spur formation C4-C5 through C6-C7. Vertebral body heights maintained. Fractured spinous processes into her RIGHT lamina at C5 and at spinous process of C6. Uncovertebral spurs encroach upon the C5-C6 neural foramina bilaterally less at C4-C5 on LEFT. Soft tissues and spinal canal: Prevertebral soft tissues normal thickness. Atherosclerotic calcifications at the carotid bifurcations bilaterally. Remaining cervical soft tissues unremarkable. Disc levels: Significant endplate spur formation and uncovertebral spurring narrow the LEFT C5-C6 neural foramen and also flattening the LEFT ventral aspect of the thecal sac and spinal cord. Upper chest: Small RIGHT pleural effusion. Lung apices otherwise clear. Other: N/A IMPRESSION: Atrophy with small vessel chronic ischemic changes of deep cerebral white matter. No acute intracranial abnormalities. Fractured spinous process C6. Fractured spinous process C5 into RIGHT lamina of C5. Multilevel degenerative disc and facet disease changes of the cervical spine. Compression of the LEFT ventral aspect of the thecal sac and spinal cord at  C5-C6 by endplate and uncovertebral spur formation. Minimally displaced distal RIGHT nasal bone fracture. No other facial bone fractures identified. Findings called to Dr.  Joni Fears on 09/26/2018 at 1342 hours. Electronically Signed   By: Lavonia Dana M.D.   On:  09/26/2018 13:42    ____________________________________________   PROCEDURES Procedures  ____________________________________________  DIFFERENTIAL DIAGNOSIS   Intracranial hemorrhage, C-spine fracture, maxillofacial fracture, basilar skull fracture, hip fracture, humerus fracture, radius/ulna fracture, dehydration, rhabdomyolysis  CLINICAL IMPRESSION / ASSESSMENT AND PLAN / ED COURSE  Medications ordered in the ED: Medications  sodium chloride 0.9 % bolus 1,000 mL (1,000 mLs Intravenous New Bag/Given 09/26/18 1328)  Tdap (BOOSTRIX) injection 0.5 mL (0.5 mLs Intramuscular Given 09/26/18 1328)  fentaNYL (SUBLIMAZE) injection 50 mcg (50 mcg Intravenous Given 09/26/18 1330)  ondansetron (ZOFRAN) injection 4 mg (4 mg Intravenous Given 09/26/18 1329)    Pertinent labs & imaging results that were available during my care of the patient were reviewed by me and considered in my medical decision making (see chart for details).    Patient presents with multiple injuries after a mechanical fall at home.  Concern for rhabdo due to prolonged downtime and age and frailty.  Vital signs unremarkable, IV fluids.  Fentanyl 50 g IV for severe pain  Clinical Course as of Sep 26 1444  Wed Sep 26, 2018  1400 C5-C6 spinous process fractures discussed with radiology.  Also discussed with neurosurgery who advises these are a stable fracture and the patient can use a cervical collar for comfort and follow-up with neurosurgery as an outpatient.   [PS]  0277 CT hip unremarkable   [PS]    Clinical Course User Index [PS] Carrie Mew, MD     ----------------------------------------- 2:56 PM on  09/26/2018 -----------------------------------------  Discussed with hospitalist for further management.  ____________________________________________   FINAL CLINICAL IMPRESSION(S) / ED DIAGNOSES    Final diagnoses:  Closed fracture of nasal bone, initial encounter  Closed fracture of spinous process of cervical vertebra, initial encounter [facility])  Traumatic rhabdomyolysis, initial encounter [facility])     ED Discharge Orders    None      Portions of this note were generated with dragon dictation software. Dictation errors may occur despite best attempts at proofreading.   Carrie Mew, MD 09/26/18 [phone]

## 2018-09-26 NOTE — Consult Note (Signed)
ANTICOAGULATION CONSULT NOTE - Initial Consult  Pharmacy Consult for Apixaban Dosing  Indication: atrial fibrillation  Allergies  Allergen Reactions  . Promethazine Anxiety, Palpitations and Other (See Comments)    IV Promethazine 6.25 mg. JIttery, anxious, couldn't lay still, increase HR  . Ace Inhibitors Cough  . Benicar [Olmesartan Medoxomil]     Unsure of reaction type  . Clarithromycin Other (See Comments)    Blisters in mouth  . Clarithromycin Other (See Comments)    Unknown   . Norvasc [Amlodipine Besylate]   . Levofloxacin Rash  . Zithromax [Azithromycin Dihydrate] Rash   Patient Measurements: Height: 5' (152.4 cm) Weight: 132 lb (59.9 kg) IBW/kg (Calculated) : 45.5  Vital Signs: Temp: 98.2 F (36.8 C) (02/19 1206) Temp Source: Oral (02/19 1206) BP: 117/49 (02/19 1523) Pulse Rate: 39 (02/19 1523)  Labs: Recent Labs    09/26/18 1218  HGB 9.5*  HCT 32.0*  PLT 182  CREATININE 1.53*  CKTOTAL 861*  TROPONINI 0.04*    Estimated Creatinine Clearance: 21 mL/min (A) (by C-G formula based on SCr of 1.53 mg/dL (H)).   Medical History: Past Medical History:  Diagnosis Date  . Asthma   . Barrett esophagus   . Chronic bronchitis   . Chronic kidney disease (CKD), stage IV (severe) ([place])   . Depression   . Diverticulosis   . GERD (gastroesophageal reflux disease)   . Gout   . Hyperlipidemia   . Hypertension   . Hypothyroid   . Mitral regurgitation    a. TTE 02/2017: EF 55-60%, normal WM, calcified mitral annulus with mod regurg, moderately dilated LA, unable to estimate PASP  . Pancreatitis due to biliary obstruction March 2010   s/p ERCP sphincterotomy, cholecystectomy  . Peripheral neuralgia   . Persistent atrial fibrillation    a. diagnosed 01/24/17; CHADS2VASc => 5 (HTN, age x 2, vascular disease with PAD and aortic plaque, female) giving her an estimated annual stroke risk of 6.7%; b. successful DCCV 03/28/2017; c. on Eliquis  . Venous insufficiency     Assessment: Pharmacy consulted for apixaban dosing for 85 yo female with PMH of A. Fib. Patient was taking apixaban 2.5mg  BID prior to admission.   Plan:  Will continue patient's home dose of apixaban 2.5mg  BID based on Scr >1.5 and Age >80.  Patient's weight also reported to be 60kg.    Pernell Dupre, PharmD, BCPS Clinical Pharmacist 09/26/2018 4:01 PM

## 2018-09-26 NOTE — Consult Note (Signed)
Name: Savannah Wilson MRN: [id number] DOB: Dec 29, 1928    ADMISSION DATE:  09/26/2018 CONSULTATION DATE:  09/26/2018  REFERRING MD :  Dr. [name]  CHIEF COMPLAINT:  Status Post Fall  BRIEF PATIENT DESCRIPTION:  85 y.o. Female, DNR, with PMH of HFpEF, CKD Stage IV, A-fib who presents s/p Fall.  Noted to have C5-C6 spinous process fracture. Requires ICU due to Bradycardia requiring Dopamine infusion, Cardiology following. Pt also with Paroxsymal A-fib and Rhabdomyolysis.   SIGNIFICANT EVENTS  09/26/2018>> Admission to [facility] ICU  STUDIES:  CT Cervical Spine 09/26/18>> Fractured spinous process C6. Fractured spinous process C5 into RIGHT lamina of C5. Multilevel degenerative disc and facet disease changes of the cervical spine.Compression of the LEFT ventral aspect of the thecal sac and spinalcord at C5-C6 by endplate and uncovertebral spur formation. CT Head 09/26/18>> No acute intracranial abnormalities. CT Maxillofacial 09/26/18>> Minimally displaced distal RIGHT nasal bone fracture. No other facial bone fractures identified. CT Right Hip 09/26/18>> No acute abnormality of the right hip. DG Right Forearm 09/26/18>> No acute abnormality.  Chronic degenerative changes DG Right Humerus 09/26/18>>No acute abnormality of the right humerus.   CULTURES: MRSA PCR 2/19>> Urine 2/19>>  ANTIBIOTICS:  HISTORY OF PRESENT ILLNESS:   Ms. Savannah Wilson is a 85 y.o. Female with a past medical history of HFpEF, Asthma, COPD, CKD Stage IV, Diverticulosis, GERD, gout, Hypertension, Hyperlipidemia, Hypothyroidism, Mitral valve regurgitation, Paroxsymal Atrial Fibrillation who presented to [facility] ED on 09/26/2018 status post fall at home.  Patient reports that she fell around 0500 this morning while getting out of bed, of which she landed on her face.  She was unable to get up, and was found by her daughter around 1200 pm.  Preceding the fall, she denies any lightheadedness, chest pain, shortness of breath, palpitations.   Initial work-up in the ED revealed CK 861, creatinine 1.53, troponin 0.04, WBC 10.4, hemoglobin 9.5.  On EKG she was noted to be bradycardic in atrial fibrillation.  CT of the head was negative, CT of the cervical spine revealed C5-C6 spinous process fractures, as well as compression of the left ventral aspect of the spinal cord at C5-C6.  CT of the right hip is negative, x-ray of both the right forearm and humerus are both negative.  Neurosurgery evaluated her in the ED, no surgical intervention recommended, recommend conservative management with collar for comfort.  Cardiology evaluated her bradycardia in the ED, she was subsequently placed on dopamine drip.  She is being admitted to [facility] ICU for treatment of bradycardia secondary to home Cardizem requiring dopamine infusion, paroxysmal atrial fibrillation, rhabdomyolysis, and C5-C6 spinous process fractures.  PCCM was consulted for further management.  PAST MEDICAL HISTORY :   has a past medical history of (HFpEF) heart failure with preserved ejection fraction ([place]), Asthma, Barrett esophagus, Chronic bronchitis, Chronic kidney disease (CKD), stage IV (severe) ([place]), Depression, Diverticulosis, GERD (gastroesophageal reflux disease), Gout, History of stress test, Hyperlipidemia, Hypertension, Hypothyroid, Mitral regurgitation, Pancreatitis due to biliary obstruction (March 2010), Peripheral neuralgia, Persistent atrial fibrillation, and Venous insufficiency.  has a past surgical history that includes Ventral hernia repair; Cataract extraction; Cholecystectomy (02/10); Vesicovaginal fistula closure w/ TAH; Tonsillectomy; Ventral hernia repair (2007); Cataract extraction (2008); Cholecystectomy (2010); Carpal tunnel release (jan 2013); Abdominal hysterectomy; CARDIOVERSION (N/A, 03/28/2017); and CARDIOVERSION (N/A, 09/14/2018). Prior to Admission medications   Medication Sig Start Date End Date Taking? Authorizing Provider  apixaban (ELIQUIS) 2.5 MG TABS tablet  Take 1 tablet (2.5 mg total) by mouth 2 (two) times daily.  02/12/18  Yes Wellington Hampshire, MD  budesonide (ENTOCORT EC) 3 MG 24 hr capsule Take 1 capsule (3 mg total) by mouth daily. 09/18/18  Yes [name], MD  budesonide (PULMICORT) 0.25 MG/2ML nebulizer solution USE ONE (1) VIAL IN NEBULIZER TWO TIMES PER DAY 08/21/18  Yes [name], MD  Cholecalciferol (VITAMIN D-3) 1000 units CAPS Take 1,000 Units by mouth daily.    Yes [provider]  diltiazem (CARDIZEM CD) 180 MG 24 hr capsule Take 1 capsule (180 mg total) by mouth daily. 09/16/18 11/15/18 Yes Sainani, Belia Heman, MD  escitalopram (LEXAPRO) 10 MG tablet Take 1 tablet (10 mg total) by mouth daily. After dinner 01/29/18  Yes [name], MD  furosemide (LASIX) 40 MG tablet Take 40 mg by mouth daily.    Yes [provider]  ipratropium-albuterol (DUONEB) 0.5-2.5 (3) MG/3ML SOLN Take 3 mLs by nebulization every 6 (six) hours as needed. 08/27/18  Yes [name], MD  levothyroxine (SYNTHROID, LEVOTHROID) 100 MCG tablet TAKE 1 TABLET BY MOUTH DAILY FOR THYROID 09/10/18  Yes [name], MD  losartan (COZAAR) 50 MG tablet TAKE ONE TABLET EVERY DAY 08/09/18  Yes [name], MD  omeprazole (PRILOSEC) 40 MG capsule TAKE ONE CAPSULE BY MOUTH TWICE A DAY. BEFORE BREAKFAST AND SUPPER 03/13/18  Yes [name], MD  vitamin B-12 (CYANOCOBALAMIN) 1000 MCG tablet Take 1,000 mcg by mouth daily.   Yes [provider]  acetaminophen (TYLENOL) 650 MG CR tablet Take 650 mg by mouth every 8 (eight) hours as needed for pain.     [provider]  ALPRAZolam Duanne Moron) 0.5 MG tablet Take 1 tablet (0.5 mg total) by mouth 2 (two) times daily as needed for anxiety. 02/07/18   [name], MD  benzonatate (TESSALON) 200 MG capsule Take 1 capsule (200 mg total) by mouth 3 (three) times daily as needed for cough. Patient not taking: Reported on 09/26/2018 08/27/18   [name], MD  cetirizine (ZYRTEC ALLERGY) 10 MG  tablet Take 10 mg by mouth daily.    [provider]  docusate sodium (COLACE) 100 MG capsule Take 1 capsule (100 mg total) by mouth 2 (two) times daily as needed for mild constipation. 08/26/18   Epifanio Lesches, MD  fluticasone (FLONASE) 50 MCG/ACT nasal spray Place 2 sprays into the nose daily. Patient taking differently: Place 2 sprays into the nose daily as needed (For allergies.).  01/30/13   [name], MD  loperamide (IMODIUM) 2 MG capsule Take 1 capsule (2 mg total) by mouth as needed for diarrhea or loose stools. 09/15/18   [name], MD  mupirocin ointment (BACTROBAN) 2 % Place 1 application into the nose 2 (two) times daily. 05/01/18   [name], MD  ondansetron (ZOFRAN ODT) 4 MG disintegrating tablet Take 1 tablet (4 mg total) by mouth every 8 (eight) hours as needed for nausea or vomiting. 09/12/18   Nena Polio, MD  PROAIR HFA 108 [phone] BASE) MCG/ACT inhaler INHALE 2 PUFFS EVERY 6 HOURS AS NEEDED FOR WHEEZING 03/13/15   [name], MD  rOPINIRole (REQUIP) 0.25 MG tablet TAKE ONE TABLET 3 TIMES DAILY Patient not taking: Reported on 09/26/2018 07/26/18   [name], MD  Spacer/Aero-Holding Chambers ([place]) [place] 1 each by Other route once. Always uses her when you're using a metered-dose inhaler. You've aromatase medicine as much, he won't have his much side effect, but you it  twice as much medicine and your lungs. 06/08/15   Ahmed Prima, MD  temazepam (RESTORIL) 15 MG capsule TAKE 1 CAPSULE BY MOUTH AT BEDTIME AS NEEDED FOR SLEEP 06/13/18   [name], MD   Allergies  Allergen Reactions  . Promethazine Anxiety, Palpitations and Other (See Comments)    IV Promethazine 6.25 mg. JIttery, anxious, couldn't lay still, increase HR  . Ace Inhibitors Cough  . Benicar [Olmesartan Medoxomil]     Unsure of reaction type  . Clarithromycin Other (See Comments)    Blisters in mouth  . Clarithromycin Other (See Comments)    Unknown     . Norvasc [Amlodipine Besylate]   . Levofloxacin Rash  . Zithromax [Azithromycin Dihydrate] Rash    FAMILY HISTORY:  family history includes Cancer in her daughter; Heart disease in her father; Hypertension in her mother; Kidney cancer in her father; Kidney disease in her daughter and mother; Kidney failure in her mother; Multiple myeloma in her daughter; Rheumatologic disease in her father. SOCIAL HISTORY:  reports that she has never smoked. She has never used smokeless tobacco. She reports that she does not drink alcohol or use drugs.  REVIEW OF SYSTEMS:  Positives in BOLD Constitutional: Negative for fever, chills, weight loss, malaise/fatigue and diaphoresis.  HENT: Negative for hearing loss, ear pain, nosebleeds, congestion, sore throat, +neck pain, tinnitus and ear discharge.   Eyes: Negative for blurred vision, double vision, photophobia, pain, discharge and redness.  Respiratory: Negative for cough, hemoptysis, sputum production, shortness of breath, wheezing and stridor.   Cardiovascular: Negative for chest pain, palpitations, orthopnea, claudication, leg swelling and PND.  Gastrointestinal: Negative for heartburn, nausea, vomiting, abdominal pain, diarrhea, constipation, blood in stool and melena.  Genitourinary: Negative for dysuria, urgency, frequency, hematuria and flank pain.  Musculoskeletal: Negative for myalgias, back pain, joint pain and falls.  Skin: Negative for itching and rash.  Neurological: Negative for +dizziness, tingling, tremors, sensory change, speech change, focal weakness, seizures, loss of consciousness, weakness and headaches.  Endo/Heme/Allergies: Negative for environmental allergies and polydipsia. Does not bruise/bleed easily.  SUBJECTIVE:  Pt denies chest pain, shortness of breath, palpitations, fever/chills, cough Reports 7/10 Neck and right shoulder pain Also reports slight dizziness On Nasal Cannula  VITAL SIGNS: Temp:  [98.2 F (36.8 C)-98.3  F (36.8 C)] 98.3 F (36.8 C) (02/19 2200) Pulse Rate:  [35-126] 64 (02/19 2130) Resp:  [14-21] 17 (02/19 2130) BP: (95-142)/(47-62) 111/53 (02/19 2130) SpO2:  [91 %-98 %] 96 % (02/19 2130) Weight:  [59.9 kg] 59.9 kg (02/19 1207)  PHYSICAL EXAMINATION: General:  Acutely ill appearing female, laying in bed, on nasal cannula, with complaints of neck and right shoulder pain Neuro:  Awake, A&O x4, Speech clear HEENT:  Significant Bruising to face, normocephalic, neck supple, no JVD Cardiovascular:  Bradycardia, irregularly irregular rhythm, no M/R/G, 1+ pulses throughout Lungs:  Clear diminished to auscultation bilaterally, even, nonlabored, normal effort Abdomen:  Soft, nontender, nondistended, no guarding or rebound tenderness, BS+ x4 Musculoskeletal:  Normal ROM of LUE and LLE.  Sensation intact to all extremities, no numbness or tingling. Unable to perform adequate exam of right UE and right LE due to severe pain Skin:  Warm/dry.  No obvious rashes, lesions, or ulcerations.  Recent Labs  Lab 09/26/18 1218  NA 136  K 3.7  CL 104  CO2 22  BUN 35*  CREATININE 1.53*  GLUCOSE 127*   Recent Labs  Lab 09/26/18 1218  HGB 9.5*  HCT 32.0*  WBC 10.4  PLT 182   Dg Chest 1 View  Result Date: 09/26/2018 CLINICAL DATA:  Unwitnessed fall this morning. The patient was found down. EXAM: CHEST  1 VIEW COMPARISON:  09/11/2018, 08/27/2018 and 09/11/2017 and CT scan of the abdomen dated 08/23/2018 FINDINGS: Heart size and pulmonary vascularity are normal. Aortic atherosclerosis. Large hiatal hernia. The left pleural effusion has resolved. Small right pleural effusion has diminished. No acute bone abnormality. IMPRESSION: 1. Improved right effusion.  Resolved left effusion. 2.  Aortic Atherosclerosis (ICD10-I70.0). 3. No acute abnormalities. 4. Large hiatal hernia, chronic. Electronically Signed   By: Lorriane Shire M.D.   On: 09/26/2018 13:58   Dg Forearm Right  Result Date: 09/26/2018 CLINICAL  DATA:  Right arm pain secondary to a fall at home this morning. EXAM: RIGHT FOREARM - 2 VIEW COMPARISON:  None. FINDINGS: There is no fracture or dislocation. Slight calcific tendinopathy at the lateral epicondyle of the distal humerus. 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 Severe arthritis of the first [facility] joint. Multiple phleboliths in the forearm. IMPRESSION: No acute abnormality.  Chronic degenerative changes as described. Electronically Signed   By: Lorriane Shire M.D.   On: 09/26/2018 13:53   Ct Head Wo Contrast  Result Date: 09/26/2018 CLINICAL DATA:  Unwitnessed fall at home this morning at 0500 hours, laid on floor until EMS was called, fell onto face, unknown loss of consciousness, neck pain, history hypertension, atrial fibrillation, stage IV chronic kidney disease, CHF EXAM: CT HEAD WITHOUT CONTRAST CT MAXILLOFACIAL WITHOUT CONTRAST CT CERVICAL SPINE WITHOUT CONTRAST TECHNIQUE: Multidetector CT imaging of the head, cervical spine, and maxillofacial structures were performed using the standard protocol without intravenous contrast. Multiplanar CT image reconstructions of the cervical spine and maxillofacial structures were also generated. Right side of face marked with BB. COMPARISON:  CT head 09/12/2018 CT cervical spine 09/11/2018 FINDINGS: CT HEAD FINDINGS Brain: Generalized atrophy. Normal ventricular morphology. No midline shift or mass effect. Small vessel chronic ischemic changes of deep cerebral white matter. No intracranial hemorrhage, mass lesion, evidence of acute infarction, or extra-axial fluid collection. Vascular: No hyperdense vessels. Atherosclerotic calcifications of internal carotid arteries at skull base. Skull: Frontal scalp hematoma.  Calvaria intact. Other: N/A CT MAXILLOFACIAL FINDINGS Osseous: Minimally displaced distal RIGHT nasal bone fracture. Scattered beam hardening artifacts of  dental origin. TMJ alignment normal. Bony orbits and zygomas intact. No additional facial bone fractures identified. Orbits: Bony orbits intact. Intraorbital soft tissue planes clear bilaterally without fluid or pneumatosis Sinuses: Paranasal sinuses, mastoid air cells and middle ear cavities clear bilaterally Soft tissues: Frontal scalp hematoma extending RIGHT periorbital. Remaining facial soft tissues unremarkable. CT CERVICAL SPINE FINDINGS Alignment: Mild anterolisthesis C3-C4. Minimal retrolisthesis C4-C5 and C5-C6. Remaining alignments normal Skull base and vertebrae: Osseous demineralization. Multilevel facet degenerative changes. Disc space narrowing and endplate spur formation C4-C5 through C6-C7. Vertebral body heights maintained. Fractured spinous processes into her RIGHT lamina at C5 and at spinous process of C6. Uncovertebral spurs encroach upon the C5-C6 neural foramina bilaterally less at C4-C5 on LEFT. Soft tissues and spinal canal: Prevertebral soft tissues normal thickness. Atherosclerotic calcifications at the carotid bifurcations bilaterally. Remaining cervical soft tissues unremarkable. Disc levels: Significant endplate spur formation and uncovertebral spurring narrow the LEFT C5-C6 neural foramen and also flattening the LEFT ventral aspect of the thecal sac and spinal cord. Upper chest: Small RIGHT pleural effusion. Lung apices otherwise clear. Other: N/A IMPRESSION: Atrophy with small vessel chronic ischemic  changes of deep cerebral white matter. No acute intracranial abnormalities. Fractured spinous process C6. Fractured spinous process C5 into RIGHT lamina of C5. Multilevel degenerative disc and facet disease changes of the cervical spine. Compression of the LEFT ventral aspect of the thecal sac and spinal cord at C5-C6 by endplate and uncovertebral spur formation. Minimally displaced distal RIGHT nasal bone fracture. No other facial bone fractures identified. Findings called to Dr.   Joni Fears on 09/26/2018 at 1342 hours. Electronically Signed   By: Lavonia Dana M.D.   On: 09/26/2018 13:42   Ct Cervical Spine Wo Contrast  Result Date: 09/26/2018 CLINICAL DATA:  Unwitnessed fall at home this morning at 0500 hours, laid on floor until EMS was called, fell onto face, unknown loss of consciousness, neck pain, history hypertension, atrial fibrillation, stage IV chronic kidney disease, CHF EXAM: CT HEAD WITHOUT CONTRAST CT MAXILLOFACIAL WITHOUT CONTRAST CT CERVICAL SPINE WITHOUT CONTRAST TECHNIQUE: Multidetector CT imaging of the head, cervical spine, and maxillofacial structures were performed using the standard protocol without intravenous contrast. Multiplanar CT image reconstructions of the cervical spine and maxillofacial structures were also generated. Right side of face marked with BB. COMPARISON:  CT head 09/12/2018 CT cervical spine 09/11/2018 FINDINGS: CT HEAD FINDINGS Brain: Generalized atrophy. Normal ventricular morphology. No midline shift or mass effect. Small vessel chronic ischemic changes of deep cerebral white matter. No intracranial hemorrhage, mass lesion, evidence of acute infarction, or extra-axial fluid collection. Vascular: No hyperdense vessels. Atherosclerotic calcifications of internal carotid arteries at skull base. Skull: Frontal scalp hematoma.  Calvaria intact. Other: N/A CT MAXILLOFACIAL FINDINGS Osseous: Minimally displaced distal RIGHT nasal bone fracture. Scattered beam hardening artifacts of dental origin. TMJ alignment normal. Bony orbits and zygomas intact. No additional facial bone fractures identified. Orbits: Bony orbits intact. Intraorbital soft tissue planes clear bilaterally without fluid or pneumatosis Sinuses: Paranasal sinuses, mastoid air cells and middle ear cavities clear bilaterally Soft tissues: Frontal scalp hematoma extending RIGHT periorbital. Remaining facial soft tissues unremarkable. CT CERVICAL SPINE FINDINGS Alignment: Mild anterolisthesis  C3-C4. Minimal retrolisthesis C4-C5 and C5-C6. Remaining alignments normal Skull base and vertebrae: Osseous demineralization. Multilevel facet degenerative changes. Disc space narrowing and endplate spur formation C4-C5 through C6-C7. Vertebral body heights maintained. Fractured spinous processes into her RIGHT lamina at C5 and at spinous process of C6. Uncovertebral spurs encroach upon the C5-C6 neural foramina bilaterally less at C4-C5 on LEFT. Soft tissues and spinal canal: Prevertebral soft tissues normal thickness. Atherosclerotic calcifications at the carotid bifurcations bilaterally. Remaining cervical soft tissues unremarkable. Disc levels: Significant endplate spur formation and uncovertebral spurring narrow the LEFT C5-C6 neural foramen and also flattening the LEFT ventral aspect of the thecal sac and spinal cord. Upper chest: Small RIGHT pleural effusion. Lung apices otherwise clear. Other: N/A IMPRESSION: Atrophy with small vessel chronic ischemic changes of deep cerebral white matter. No acute intracranial abnormalities. Fractured spinous process C6. Fractured spinous process C5 into RIGHT lamina of C5. Multilevel degenerative disc and facet disease changes of the cervical spine. Compression of the LEFT ventral aspect of the thecal sac and spinal cord at C5-C6 by endplate and uncovertebral spur formation. Minimally displaced distal RIGHT nasal bone fracture. No other facial bone fractures identified. Findings called to Dr.  Joni Fears on 09/26/2018 at 1342 hours. Electronically Signed   By: Lavonia Dana M.D.   On: 09/26/2018 13:42   Ct Hip Right Wo Contrast  Result Date: 09/26/2018 CLINICAL DATA:  Right hip pain since an unwitnessed fall this morning. EXAM: CT OF THE  RIGHT HIP WITHOUT CONTRAST TECHNIQUE: Multidetector CT imaging of the right hip was performed according to the standard protocol. Multiplanar CT image reconstructions were also generated. COMPARISON:  Radiographs dated 09/26/2018  FINDINGS: Bones/Joint/Cartilage There is no fracture or dislocation. Minimal marginal osteophytes on the right acetabulum. Slight calcifications at the gluteal tendon insertions on the right greater trochanter. No effusion. Muscles and Tendons Normal. Soft tissues No acute abnormality.  Sigmoid diverticulosis. IMPRESSION: No acute abnormality of the right hip. Electronically Signed   By: Lorriane Shire M.D.   On: 09/26/2018 14:35   Dg Humerus Right  Result Date: 09/26/2018 CLINICAL DATA:  Right arm pain due to an unwitnessed fall at home this morning. EXAM: RIGHT HUMERUS - 2+ VIEW COMPARISON:  None. FINDINGS: There is no fracture or dislocation. There is slight chronic changes at the lateral epicondyle of the distal humerus as well as calcific tendinopathy at the rotator cuff of the right shoulder. Slight degenerative changes of the acromion with narrowing of the subacromial joint space consistent with rotator cuff disease. IMPRESSION: No acute abnormality of the right humerus. Electronically Signed   By: Lorriane Shire M.D.   On: 09/26/2018 13:51   Dg Hip Unilat W Or Wo Pelvis 2-3 Views Right  Result Date: 09/26/2018 CLINICAL DATA:  Right hip pain after a fall at home this morning. EXAM: DG HIP (WITH OR WITHOUT PELVIS) 2-3V RIGHT COMPARISON:  None. FINDINGS: There is no evidence of hip fracture or dislocation. There is no evidence of significant arthropathy or other focal bone abnormality of the right hip. Medial joint space narrowing of the left hip. IMPRESSION: No acute abnormalities.  Negative right hip. Electronically Signed   By: Lorriane Shire M.D.   On: 09/26/2018 13:55   Ct Maxillofacial Wo Contrast  Result Date: 09/26/2018 CLINICAL DATA:  Unwitnessed fall at home this morning at 0500 hours, laid on floor until EMS was called, fell onto face, unknown loss of consciousness, neck pain, history hypertension, atrial fibrillation, stage IV chronic kidney disease, CHF EXAM: CT HEAD WITHOUT CONTRAST CT  MAXILLOFACIAL WITHOUT CONTRAST CT CERVICAL SPINE WITHOUT CONTRAST TECHNIQUE: Multidetector CT imaging of the head, cervical spine, and maxillofacial structures were performed using the standard protocol without intravenous contrast. Multiplanar CT image reconstructions of the cervical spine and maxillofacial structures were also generated. Right side of face marked with BB. COMPARISON:  CT head 09/12/2018 CT cervical spine 09/11/2018 FINDINGS: CT HEAD FINDINGS Brain: Generalized atrophy. Normal ventricular morphology. No midline shift or mass effect. Small vessel chronic ischemic changes of deep cerebral white matter. No intracranial hemorrhage, mass lesion, evidence of acute infarction, or extra-axial fluid collection. Vascular: No hyperdense vessels. Atherosclerotic calcifications of internal carotid arteries at skull base. Skull: Frontal scalp hematoma.  Calvaria intact. Other: N/A CT MAXILLOFACIAL FINDINGS Osseous: Minimally displaced distal RIGHT nasal bone fracture. Scattered beam hardening artifacts of dental origin. TMJ alignment normal. Bony orbits and zygomas intact. No additional facial bone fractures identified. Orbits: Bony orbits intact. Intraorbital soft tissue planes clear bilaterally without fluid or pneumatosis Sinuses: Paranasal sinuses, mastoid air cells and middle ear cavities clear bilaterally Soft tissues: Frontal scalp hematoma extending RIGHT periorbital. Remaining facial soft tissues unremarkable. CT CERVICAL SPINE FINDINGS Alignment: Mild anterolisthesis C3-C4. Minimal retrolisthesis C4-C5 and C5-C6. Remaining alignments normal Skull base and vertebrae: Osseous demineralization. Multilevel facet degenerative changes. Disc space narrowing and endplate spur formation C4-C5 through C6-C7. Vertebral body heights maintained. Fractured spinous processes into her RIGHT lamina at C5 and at spinous process of C6. Uncovertebral  spurs encroach upon the C5-C6 neural foramina bilaterally less at C4-C5  on LEFT. Soft tissues and spinal canal: Prevertebral soft tissues normal thickness. Atherosclerotic calcifications at the carotid bifurcations bilaterally. Remaining cervical soft tissues unremarkable. Disc levels: Significant endplate spur formation and uncovertebral spurring narrow the LEFT C5-C6 neural foramen and also flattening the LEFT ventral aspect of the thecal sac and spinal cord. Upper chest: Small RIGHT pleural effusion. Lung apices otherwise clear. Other: N/A IMPRESSION: Atrophy with small vessel chronic ischemic changes of deep cerebral white matter. No acute intracranial abnormalities. Fractured spinous process C6. Fractured spinous process C5 into RIGHT lamina of C5. Multilevel degenerative disc and facet disease changes of the cervical spine. Compression of the LEFT ventral aspect of the thecal sac and spinal cord at C5-C6 by endplate and uncovertebral spur formation. Minimally displaced distal RIGHT nasal bone fracture. No other facial bone fractures identified. Findings called to Dr.  Joni Fears on 09/26/2018 at 1342 hours. Electronically Signed   By: Lavonia Dana M.D.   On: 09/26/2018 13:42    ASSESSMENT / PLAN:  Bradycardia, likely in setting of home Cardizem Paroxysmal Atrial Fibrillation Mildly elevated troponin, likely demand ischemia Chronic Diastolic CHF without acute Exacerbation Hx: HTN, HLD, HFpEF -Cardiac monitoring -Maintain MAP >65 -Dopamine drip to maintain MAP goal and for Bradycardia -Cardiology following, appreciate input -Continue Eliquis per Cardiology recommendation due to stroke risk -Hold home Cardizem -Trend troponin -Gentle IVF for Rhabdomyolysis, watch volume status closely with history of CHF -Check TSH  C5-C6 Spinous Process Fracture Severe Neck and Right Shoulder Pain -Neurosurgery consulted, appreciate input -Neurosurgery recommends conservative management with Collar for comfort given stability of fracture, with outpatient follow up -Pain  management  Rhabdomyolysis Chronic Kidney Disease Stage IV -Monitor I&O's / urinary output -Follow BMP -Ensure adequate renal perfusion -Avoid nephrotoxic agents as able -Replace electrolytes as indicated -Gentle IVF: NS @ 75 ml/hr given CHF history -Trend CK  Anemia without active signs of bleeding -Monitor for S/Sx of bleeding -Trend CBC -Eliquis for VTE Prophylaxis  -Transfuse for Hgb <7   DISPOSITION: ICU GOALS OF CARE: DNR/DNI VTE PROPHYLAXIS: Eliquis UPDATES: Updated pt at bedside 09/26/2018.   Darel Hong, AGACNP-BC [facility] Pager: [phone] Cell: [phone]  09/26/2018, 10:52 PM

## 2018-09-26 NOTE — ED Notes (Signed)
Tom RN, aware of bed assigned

## 2018-09-26 NOTE — ED Provider Notes (Signed)
EKG reviewed by me at 1840 Heart rate 65 QRS 100 QTc 440 Atrial fibrillation, mild T wave abnormality in inferolateral distribution.  No evidence of ST elevation   Delman Kitten, MD 09/26/18 [phone]

## 2018-09-26 NOTE — ED Notes (Signed)
Patient given water to drink.  

## 2018-09-26 NOTE — ED Notes (Signed)
ED TO INPATIENT HANDOFF REPORT  Name/Age/Gender Savannah Wilson 85 y.o. female  Code Status    Code Status Orders  (From admission, onward)         Start     Ordered   09/26/18 1936  Do not attempt resuscitation (DNR)  Continuous    Question Answer Comment  In the event of cardiac or respiratory ARREST Do not call a "code blue"   In the event of cardiac or respiratory ARREST Do not perform Intubation, CPR, defibrillation or ACLS   In the event of cardiac or respiratory ARREST Use medication by any route, position, wound care, and other measures to relive pain and suffering. May use oxygen, suction and manual treatment of airway obstruction as needed for comfort.   Comments RN may pronounce      09/26/18 1935        Code Status History    Date Active Date Inactive Code Status Order ID Comments User Context   09/13/2018 0211 09/15/2018 1930 DNR [id number]  Lance Coon, MD Inpatient   08/24/2018 0010 08/26/2018 1742 DNR [id number]  Vaughan Basta, MD Inpatient   09/19/2017 2257 09/21/2017 1633 Full Code [id number]  Dustin Flock, MD Inpatient   04/15/2017 1525 04/19/2017 1927 Full Code [id number]  Gladstone Lighter, MD Inpatient    Advance Directive Documentation     Most Recent Value  Type of Advance Directive  Healthcare Power of [place], Out of facility DNR (pink MOST or yellow form)  Pre-existing out of facility DNR order (yellow form or pink MOST form)  -  "MOST" Form in Place?  -      Home/SNF/Other Home  Chief Complaint EMS Fall  Level of Care/Admitting Diagnosis ED Disposition    ED Disposition Condition [place]: [facility] [[id number]]  Level of Care: ICU [6]  Diagnosis: Fall [[id number]]  Admitting Physician: [name] [[id number]]  Attending Physician: [name] [[id number]]  Estimated length of stay: past midnight tomorrow  Certification:: I certify this patient will need inpatient services for at least 2 midnights  Bed  request comments: fall/bradycardia/relative hypotension req dopamine  PT Class (Do Not Modify): Inpatient [101]  PT Acc Code (Do Not Modify): Private [1]       Medical History Past Medical History:  Diagnosis Date  . (HFpEF) heart failure with preserved ejection fraction ([employer])    a. 08/2018 Echo: EF 55-60%, no rwma, Mild MR, mod dil LA. Nl RV fxn.  . Asthma   . Barrett esophagus   . Chronic bronchitis   . Chronic kidney disease (CKD), stage IV (severe) ([place])   . Depression   . Diverticulosis   . GERD (gastroesophageal reflux disease)   . Gout   . History of stress test    a. 04/2017 MV: prior inf/inflat infarct w/ peri-infarct ischemia. EF 55-65%. Sev hypotension w/ lexiscan.  . Hyperlipidemia   . Hypertension   . Hypothyroid   . Mitral regurgitation    a. TTE 02/2017: EF 55-60%, normal WM, calcified mitral annulus with mod regurg, moderately dilated LA; b. 08/2018 Echo: Mild MR.  . Pancreatitis due to biliary obstruction March 2010   s/p ERCP sphincterotomy, cholecystectomy  . Peripheral neuralgia   . Persistent atrial fibrillation    a. diagnosed 01/24/17-> CHADS2VASc => 5 (HTN, age x 2, vascular disease with PAD and aortic plaque, female) giving her an estimated annual stroke risk of 6.7%->Eliquis; b. successful DCCV 03/28/2017; c. BB d/c'd 2/2 bradycardia; d. 08/2018  Recurrent Afib-> DCCV-> dilt started.  . Venous insufficiency     Allergies Allergies  Allergen Reactions  . Promethazine Anxiety, Palpitations and Other (See Comments)    IV Promethazine 6.25 mg. JIttery, anxious, couldn't lay still, increase HR  . Ace Inhibitors Cough  . Benicar [Olmesartan Medoxomil]     Unsure of reaction type  . Clarithromycin Other (See Comments)    Blisters in mouth  . Clarithromycin Other (See Comments)    Unknown   . Norvasc [Amlodipine Besylate]   . Levofloxacin Rash  . Zithromax [Azithromycin Dihydrate] Rash    IV Location/Drains/Wounds Patient Lines/Drains/Airways Status    Active Line/Drains/Airways    Name:   Placement date:   Placement time:   Site:   Days:   Peripheral IV 09/26/18 Right Arm   09/26/18    1218    Arm   less than 1   Peripheral IV 09/26/18   09/26/18    0530    -   less than 1   Peripheral IV 09/26/18 Left Arm   09/26/18    1937    Arm   less than 1          Labs/Imaging Results for orders placed or performed during the hospital encounter of 09/26/18 (from the past 48 hour(s))  Basic metabolic panel     Status: Abnormal   Collection Time: 09/26/18 12:18 PM  Result Value Ref Range   Sodium 136 135 - 145 mmol/L   Potassium 3.7 3.5 - 5.1 mmol/L   Chloride 104 98 - 111 mmol/L   CO2 22 22 - 32 mmol/L   Glucose, Bld 127 (H) 70 - 99 mg/dL   BUN 35 (H) 8 - 23 mg/dL   Creatinine, Ser 1.53 (H) 0.44 - 1.00 mg/dL   Calcium 8.4 (L) 8.9 - 10.3 mg/dL   GFR calc non Af Amer 30 (L) >60 mL/min   GFR calc Af Amer 35 (L) >60 mL/min   Anion gap 10 5 - 15    Comment: Performed at [facility], [address] [place], [place] [address]  CBC with Differential     Status: Abnormal   Collection Time: 09/26/18 12:18 PM  Result Value Ref Range   WBC 10.4 4.0 - 10.5 K/uL   RBC 3.45 (L) 3.87 - 5.11 MIL/uL   Hemoglobin 9.5 (L) 12.0 - 15.0 g/dL   HCT 32.0 (L) 36.0 - 46.0 %   MCV 92.8 80.0 - 100.0 fL   MCH 27.5 26.0 - 34.0 pg   MCHC 29.7 (L) 30.0 - 36.0 g/dL   RDW 17.1 (H) 11.5 - 15.5 %   Platelets 182 150 - 400 K/uL   nRBC 0.2 0.0 - 0.2 %   Neutrophils Relative % 88 %   Neutro Abs 9.1 (H) 1.7 - 7.7 K/uL   Lymphocytes Relative 6 %   Lymphs Abs 0.7 0.7 - 4.0 K/uL   Monocytes Relative 6 %   Monocytes Absolute 0.6 0.1 - 1.0 K/uL   Eosinophils Relative 0 %   Eosinophils Absolute 0.0 0.0 - 0.5 K/uL   Basophils Relative 0 %   Basophils Absolute 0.0 0.0 - 0.1 K/uL   Immature Granulocytes 0 %   Abs Immature Granulocytes 0.04 0.00 - 0.07 K/uL    Comment: Performed at [facility], [address] [place],  [address]  Troponin  I - Once     Status: Abnormal   Collection Time: 09/26/18 12:18 PM  Result Value Ref Range  Troponin I 0.04 (HH) <0.03 ng/mL    Comment: CRITICAL RESULT CALLED TO, READ BACK BY AND VERIFIED WITH KAILEY WALKER @1254  ON 09/26/2018 BY FMW Performed at [facility], [address] [place], [place] [address]   CK     Status: Abnormal   Collection Time: 09/26/18 12:18 PM  Result Value Ref Range   Total CK 861 (H) 38 - 234 U/L    Comment: Performed at [facility], [address] [place],  [address]   Dg Chest 1 View  Result Date: 09/26/2018 CLINICAL DATA:  Unwitnessed fall this morning. The patient was found down. EXAM: CHEST  1 VIEW COMPARISON:  09/11/2018, 08/27/2018 and 09/11/2017 and CT scan of the abdomen dated 08/23/2018 FINDINGS: Heart size and pulmonary vascularity are normal. Aortic atherosclerosis. Large hiatal hernia. The left pleural effusion has resolved. Small right pleural effusion has diminished. No acute bone abnormality. IMPRESSION: 1. Improved right effusion.  Resolved left effusion. 2.  Aortic Atherosclerosis (ICD10-I70.0). 3. No acute abnormalities. 4. Large hiatal hernia, chronic. Electronically Signed   By: Lorriane Shire M.D.   On: 09/26/2018 13:58   Dg Forearm Right  Result Date: 09/26/2018 CLINICAL DATA:  Right arm pain secondary to a fall at home this morning. EXAM: RIGHT FOREARM - 2 VIEW COMPARISON:  None. FINDINGS: There is no fracture or dislocation. Slight calcific tendinopathy at the lateral epicondyle of the distal humerus. 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 Severe arthritis of the first [facility] joint. Multiple phleboliths in the forearm. IMPRESSION: No acute abnormality.  Chronic degenerative changes as described. Electronically Signed   By: Lorriane Shire M.D.   On: 09/26/2018 13:53   Ct Head Wo Contrast  Result Date: 09/26/2018 CLINICAL DATA:   Unwitnessed fall at home this morning at 0500 hours, laid on floor until EMS was called, fell onto face, unknown loss of consciousness, neck pain, history hypertension, atrial fibrillation, stage IV chronic kidney disease, CHF EXAM: CT HEAD WITHOUT CONTRAST CT MAXILLOFACIAL WITHOUT CONTRAST CT CERVICAL SPINE WITHOUT CONTRAST TECHNIQUE: Multidetector CT imaging of the head, cervical spine, and maxillofacial structures were performed using the standard protocol without intravenous contrast. Multiplanar CT image reconstructions of the cervical spine and maxillofacial structures were also generated. Right side of face marked with BB. COMPARISON:  CT head 09/12/2018 CT cervical spine 09/11/2018 FINDINGS: CT HEAD FINDINGS Brain: Generalized atrophy. Normal ventricular morphology. No midline shift or mass effect. Small vessel chronic ischemic changes of deep cerebral white matter. No intracranial hemorrhage, mass lesion, evidence of acute infarction, or extra-axial fluid collection. Vascular: No hyperdense vessels. Atherosclerotic calcifications of internal carotid arteries at skull base. Skull: Frontal scalp hematoma.  Calvaria intact. Other: N/A CT MAXILLOFACIAL FINDINGS Osseous: Minimally displaced distal RIGHT nasal bone fracture. Scattered beam hardening artifacts of dental origin. TMJ alignment normal. Bony orbits and zygomas intact. No additional facial bone fractures identified. Orbits: Bony orbits intact. Intraorbital soft tissue planes clear bilaterally without fluid or pneumatosis Sinuses: Paranasal sinuses, mastoid air cells and middle ear cavities clear bilaterally Soft tissues: Frontal scalp hematoma extending RIGHT periorbital. Remaining facial soft tissues unremarkable. CT CERVICAL SPINE FINDINGS Alignment: Mild anterolisthesis C3-C4. Minimal retrolisthesis C4-C5 and C5-C6. Remaining alignments normal Skull base and vertebrae: Osseous demineralization. Multilevel facet degenerative changes. Disc space  narrowing and endplate spur formation C4-C5 through C6-C7. Vertebral body heights maintained. Fractured spinous processes into her RIGHT lamina at C5 and at spinous process of C6.  Uncovertebral spurs encroach upon the C5-C6 neural foramina bilaterally less at C4-C5 on LEFT. Soft tissues and spinal canal: Prevertebral soft tissues normal thickness. Atherosclerotic calcifications at the carotid bifurcations bilaterally. Remaining cervical soft tissues unremarkable. Disc levels: Significant endplate spur formation and uncovertebral spurring narrow the LEFT C5-C6 neural foramen and also flattening the LEFT ventral aspect of the thecal sac and spinal cord. Upper chest: Small RIGHT pleural effusion. Lung apices otherwise clear. Other: N/A IMPRESSION: Atrophy with small vessel chronic ischemic changes of deep cerebral white matter. No acute intracranial abnormalities. Fractured spinous process C6. Fractured spinous process C5 into RIGHT lamina of C5. Multilevel degenerative disc and facet disease changes of the cervical spine. Compression of the LEFT ventral aspect of the thecal sac and spinal cord at C5-C6 by endplate and uncovertebral spur formation. Minimally displaced distal RIGHT nasal bone fracture. No other facial bone fractures identified. Findings called to Dr.  Joni Fears on 09/26/2018 at 1342 hours. Electronically Signed   By: Lavonia Dana M.D.   On: 09/26/2018 13:42   Ct Cervical Spine Wo Contrast  Result Date: 09/26/2018 CLINICAL DATA:  Unwitnessed fall at home this morning at 0500 hours, laid on floor until EMS was called, fell onto face, unknown loss of consciousness, neck pain, history hypertension, atrial fibrillation, stage IV chronic kidney disease, CHF EXAM: CT HEAD WITHOUT CONTRAST CT MAXILLOFACIAL WITHOUT CONTRAST CT CERVICAL SPINE WITHOUT CONTRAST TECHNIQUE: Multidetector CT imaging of the head, cervical spine, and maxillofacial structures were performed using the standard protocol without  intravenous contrast. Multiplanar CT image reconstructions of the cervical spine and maxillofacial structures were also generated. Right side of face marked with BB. COMPARISON:  CT head 09/12/2018 CT cervical spine 09/11/2018 FINDINGS: CT HEAD FINDINGS Brain: Generalized atrophy. Normal ventricular morphology. No midline shift or mass effect. Small vessel chronic ischemic changes of deep cerebral white matter. No intracranial hemorrhage, mass lesion, evidence of acute infarction, or extra-axial fluid collection. Vascular: No hyperdense vessels. Atherosclerotic calcifications of internal carotid arteries at skull base. Skull: Frontal scalp hematoma.  Calvaria intact. Other: N/A CT MAXILLOFACIAL FINDINGS Osseous: Minimally displaced distal RIGHT nasal bone fracture. Scattered beam hardening artifacts of dental origin. TMJ alignment normal. Bony orbits and zygomas intact. No additional facial bone fractures identified. Orbits: Bony orbits intact. Intraorbital soft tissue planes clear bilaterally without fluid or pneumatosis Sinuses: Paranasal sinuses, mastoid air cells and middle ear cavities clear bilaterally Soft tissues: Frontal scalp hematoma extending RIGHT periorbital. Remaining facial soft tissues unremarkable. CT CERVICAL SPINE FINDINGS Alignment: Mild anterolisthesis C3-C4. Minimal retrolisthesis C4-C5 and C5-C6. Remaining alignments normal Skull base and vertebrae: Osseous demineralization. Multilevel facet degenerative changes. Disc space narrowing and endplate spur formation C4-C5 through C6-C7. Vertebral body heights maintained. Fractured spinous processes into her RIGHT lamina at C5 and at spinous process of C6. Uncovertebral spurs encroach upon the C5-C6 neural foramina bilaterally less at C4-C5 on LEFT. Soft tissues and spinal canal: Prevertebral soft tissues normal thickness. Atherosclerotic calcifications at the carotid bifurcations bilaterally. Remaining cervical soft tissues unremarkable. Disc  levels: Significant endplate spur formation and uncovertebral spurring narrow the LEFT C5-C6 neural foramen and also flattening the LEFT ventral aspect of the thecal sac and spinal cord. Upper chest: Small RIGHT pleural effusion. Lung apices otherwise clear. Other: N/A IMPRESSION: Atrophy with small vessel chronic ischemic changes of deep cerebral white matter. No acute intracranial abnormalities. Fractured spinous process C6. Fractured spinous process C5 into RIGHT lamina of C5. Multilevel degenerative disc and facet disease changes of the cervical spine. Compression of  the LEFT ventral aspect of the thecal sac and spinal cord at C5-C6 by endplate and uncovertebral spur formation. Minimally displaced distal RIGHT nasal bone fracture. No other facial bone fractures identified. Findings called to Dr.  Joni Fears on 09/26/2018 at 1342 hours. Electronically Signed   By: Lavonia Dana M.D.   On: 09/26/2018 13:42   Ct Hip Right Wo Contrast  Result Date: 09/26/2018 CLINICAL DATA:  Right hip pain since an unwitnessed fall this morning. EXAM: CT OF THE RIGHT HIP WITHOUT CONTRAST TECHNIQUE: Multidetector CT imaging of the right hip was performed according to the standard protocol. Multiplanar CT image reconstructions were also generated. COMPARISON:  Radiographs dated 09/26/2018 FINDINGS: Bones/Joint/Cartilage There is no fracture or dislocation. Minimal marginal osteophytes on the right acetabulum. Slight calcifications at the gluteal tendon insertions on the right greater trochanter. No effusion. Muscles and Tendons Normal. Soft tissues No acute abnormality.  Sigmoid diverticulosis. IMPRESSION: No acute abnormality of the right hip. Electronically Signed   By: Lorriane Shire M.D.   On: 09/26/2018 14:35   Dg Humerus Right  Result Date: 09/26/2018 CLINICAL DATA:  Right arm pain due to an unwitnessed fall at home this morning. EXAM: RIGHT HUMERUS - 2+ VIEW COMPARISON:  None. FINDINGS: There is no fracture or dislocation.  There is slight chronic changes at the lateral epicondyle of the distal humerus as well as calcific tendinopathy at the rotator cuff of the right shoulder. Slight degenerative changes of the acromion with narrowing of the subacromial joint space consistent with rotator cuff disease. IMPRESSION: No acute abnormality of the right humerus. Electronically Signed   By: Lorriane Shire M.D.   On: 09/26/2018 13:51   Dg Hip Unilat W Or Wo Pelvis 2-3 Views Right  Result Date: 09/26/2018 CLINICAL DATA:  Right hip pain after a fall at home this morning. EXAM: DG HIP (WITH OR WITHOUT PELVIS) 2-3V RIGHT COMPARISON:  None. FINDINGS: There is no evidence of hip fracture or dislocation. There is no evidence of significant arthropathy or other focal bone abnormality of the right hip. Medial joint space narrowing of the left hip. IMPRESSION: No acute abnormalities.  Negative right hip. Electronically Signed   By: Lorriane Shire M.D.   On: 09/26/2018 13:55   Ct Maxillofacial Wo Contrast  Result Date: 09/26/2018 CLINICAL DATA:  Unwitnessed fall at home this morning at 0500 hours, laid on floor until EMS was called, fell onto face, unknown loss of consciousness, neck pain, history hypertension, atrial fibrillation, stage IV chronic kidney disease, CHF EXAM: CT HEAD WITHOUT CONTRAST CT MAXILLOFACIAL WITHOUT CONTRAST CT CERVICAL SPINE WITHOUT CONTRAST TECHNIQUE: Multidetector CT imaging of the head, cervical spine, and maxillofacial structures were performed using the standard protocol without intravenous contrast. Multiplanar CT image reconstructions of the cervical spine and maxillofacial structures were also generated. Right side of face marked with BB. COMPARISON:  CT head 09/12/2018 CT cervical spine 09/11/2018 FINDINGS: CT HEAD FINDINGS Brain: Generalized atrophy. Normal ventricular morphology. No midline shift or mass effect. Small vessel chronic ischemic changes of deep cerebral white matter. No intracranial hemorrhage,  mass lesion, evidence of acute infarction, or extra-axial fluid collection. Vascular: No hyperdense vessels. Atherosclerotic calcifications of internal carotid arteries at skull base. Skull: Frontal scalp hematoma.  Calvaria intact. Other: N/A CT MAXILLOFACIAL FINDINGS Osseous: Minimally displaced distal RIGHT nasal bone fracture. Scattered beam hardening artifacts of dental origin. TMJ alignment normal. Bony orbits and zygomas intact. No additional facial bone fractures identified. Orbits: Bony orbits intact. Intraorbital soft tissue planes clear bilaterally without  fluid or pneumatosis Sinuses: Paranasal sinuses, mastoid air cells and middle ear cavities clear bilaterally Soft tissues: Frontal scalp hematoma extending RIGHT periorbital. Remaining facial soft tissues unremarkable. CT CERVICAL SPINE FINDINGS Alignment: Mild anterolisthesis C3-C4. Minimal retrolisthesis C4-C5 and C5-C6. Remaining alignments normal Skull base and vertebrae: Osseous demineralization. Multilevel facet degenerative changes. Disc space narrowing and endplate spur formation C4-C5 through C6-C7. Vertebral body heights maintained. Fractured spinous processes into her RIGHT lamina at C5 and at spinous process of C6. Uncovertebral spurs encroach upon the C5-C6 neural foramina bilaterally less at C4-C5 on LEFT. Soft tissues and spinal canal: Prevertebral soft tissues normal thickness. Atherosclerotic calcifications at the carotid bifurcations bilaterally. Remaining cervical soft tissues unremarkable. Disc levels: Significant endplate spur formation and uncovertebral spurring narrow the LEFT C5-C6 neural foramen and also flattening the LEFT ventral aspect of the thecal sac and spinal cord. Upper chest: Small RIGHT pleural effusion. Lung apices otherwise clear. Other: N/A IMPRESSION: Atrophy with small vessel chronic ischemic changes of deep cerebral white matter. No acute intracranial abnormalities. Fractured spinous process C6. Fractured  spinous process C5 into RIGHT lamina of C5. Multilevel degenerative disc and facet disease changes of the cervical spine. Compression of the LEFT ventral aspect of the thecal sac and spinal cord at C5-C6 by endplate and uncovertebral spur formation. Minimally displaced distal RIGHT nasal bone fracture. No other facial bone fractures identified. Findings called to Dr.  Joni Fears on 09/26/2018 at 1342 hours. Electronically Signed   By: Lavonia Dana M.D.   On: 09/26/2018 13:42    Pending Labs Unresulted Labs (From admission, onward)    Start     Ordered   09/27/18 0500  TSH  Tomorrow morning,   STAT     09/26/18 1935   09/27/18 0500  CBC  Tomorrow morning,   STAT     09/26/18 1935   09/27/18 0500  Protime-INR  Tomorrow morning,   STAT     09/26/18 1935   09/27/18 0500  Comprehensive metabolic panel  Tomorrow morning,   STAT     09/26/18 1935   09/27/18 0500  CK  Daily,   STAT     09/26/18 1935   09/26/18 1221  Urinalysis, Complete w Microscopic  ONCE - STAT,   STAT     09/26/18 1220   09/26/18 1221  Urine culture  Once,   STAT     09/26/18 1220          Vitals/Pain Today's Vitals   09/26/18 2043 09/26/18 2046 09/26/18 2100 09/26/18 2130  BP: (!) 138/49  (!) 132/51 (!) 111/53  Pulse: (!) 126  67 64  Resp: 16  19 17   Temp:      TempSrc:      SpO2: 97%  97% 96%  Weight:      Height:      PainSc:  5   6     Isolation Precautions No active isolations  Medications Medications  0.9 %  sodium chloride infusion (has no administration in time range)  acetaminophen (TYLENOL) tablet 650 mg (has no administration in time range)    Or  acetaminophen (TYLENOL) suppository 650 mg (has no administration in time range)  traMADol (ULTRAM) tablet 50 mg (50 mg Oral Given 09/26/18 1810)  ondansetron (ZOFRAN) tablet 4 mg (has no administration in time range)    Or  ondansetron (ZOFRAN) injection 4 mg (has no administration in time range)  docusate sodium (COLACE) capsule 100 mg (has no  administration in time range)  bisacodyl (DULCOLAX) EC tablet 5 mg (has no administration in time range)  morphine 2 MG/ML injection 2 mg (2 mg Intravenous Given 09/26/18 1943)  apixaban (ELIQUIS) tablet 2.5 mg (has no administration in time range)  DOPamine (INTROPIN) 800 mg in dextrose 5 % 250 mL (3.2 mg/mL) infusion (5 mcg/kg/min  59.9 kg Intravenous New Bag/Given 09/26/18 1725)  sodium chloride 0.9 % bolus 1,000 mL (1,000 mLs Intravenous New Bag/Given 09/26/18 1328)  Tdap (BOOSTRIX) injection 0.5 mL (0.5 mLs Intramuscular Given 09/26/18 1328)  fentaNYL (SUBLIMAZE) injection 50 mcg (50 mcg Intravenous Given 09/26/18 1330)  ondansetron (ZOFRAN) injection 4 mg (4 mg Intravenous Given 09/26/18 1329)    Mobility non-ambulatory

## 2018-09-26 NOTE — H&P (Addendum)
[facility] [place] NAME: Savannah Wilson    MR#:  [id number]  DATE OF BIRTH:  [birth year]  DATE OF ADMISSION:  09/26/2018  PRIMARY CARE PHYSICIAN: [name], MD   REQUESTING/REFERRING PHYSICIAN:  Carrie Mew, MD  Physician  Emergency Medicine     CHIEF COMPLAINT:  Fall  HISTORY OF PRESENT ILLNESS:  Savannah Wilson  is a 85 y.o. female with a known history of chronic kidney disease stage IV, depression, atrial fibrillation on Eliquis hypertension hyperlipidemia hypothyroidism, GERD and multiple other medical problems is presenting to the ED after she sustained a fall. Patient sustained a fall mechanically at 5 AM today while she was trying to get out of the bed and putting on her robe to go to the bathroom and she was just laying on the floor until daughter came and found her on the floor at around 11 AM.  Patient is brought into the ED.  CT head is negative but CT of the cervical spine has revealed C5-C6 spinous process fracture.  As per my discussion with neurosurgery as well as the ED physicians discussion no surgical interventions needed.  CT hip is also negative patient is resting comfortably.  Stepdaughter [name] at bedside PAST MEDICAL HISTORY:   Past Medical History:  Diagnosis Date  . Asthma   . Barrett esophagus   . Chronic bronchitis   . Chronic kidney disease (CKD), stage IV (severe) ([place])   . Depression   . Diverticulosis   . GERD (gastroesophageal reflux disease)   . Gout   . Hyperlipidemia   . Hypertension   . Hypothyroid   . Mitral regurgitation    a. TTE 02/2017: EF 55-60%, normal WM, calcified mitral annulus with mod regurg, moderately dilated LA, unable to estimate PASP  . Pancreatitis due to biliary obstruction March 2010   s/p ERCP sphincterotomy, cholecystectomy  . Peripheral neuralgia   . Persistent atrial fibrillation    a. diagnosed 01/24/17; CHADS2VASc => 5 (HTN, age x 2, vascular disease with PAD  and aortic plaque, female) giving her an estimated annual stroke risk of 6.7%; b. successful DCCV 03/28/2017; c. on Eliquis  . Venous insufficiency     PAST SURGICAL HISTOIRY:   Past Surgical History:  Procedure Laterality Date  . ABDOMINAL HYSTERECTOMY    . CARDIOVERSION N/A 03/28/2017   Procedure: CARDIOVERSION;  Surgeon: Nelva Bush, MD;  Location: [facility];  Service: Cardiovascular;  Laterality: N/A;  . CARDIOVERSION N/A 09/14/2018   Procedure: CARDIOVERSION;  Surgeon: Minna Merritts, MD;  Location: [facility];  Service: Cardiovascular;  Laterality: N/A;  . CARPAL TUNNEL RELEASE  jan 2013   [name]  . CATARACT EXTRACTION     right  . CATARACT EXTRACTION  2008  . CHOLECYSTECTOMY  02/10  . CHOLECYSTECTOMY  2010  . TONSILLECTOMY    . VENTRAL HERNIA REPAIR    . VENTRAL HERNIA REPAIR  2007  . VESICOVAGINAL FISTULA CLOSURE W/ TAH      SOCIAL HISTORY:   Social History   Tobacco Use  . Smoking status: Never Smoker  . Smokeless tobacco: Never Used  Substance Use Topics  . Alcohol use: No    Comment: rare    FAMILY HISTORY:   Family History  Problem Relation Age of Onset  . Kidney failure Mother   . Hypertension Mother   . Kidney disease Mother   . Multiple myeloma Daughter   . Cancer Daughter  multiple myeloma  . Kidney disease Daughter   . Heart disease Father   . Rheumatologic disease Father   . Kidney cancer Father     DRUG ALLERGIES:   Allergies  Allergen Reactions  . Promethazine Anxiety, Palpitations and Other (See Comments)    IV Promethazine 6.25 mg. JIttery, anxious, couldn't lay still, increase HR  . Ace Inhibitors Cough  . Benicar [Olmesartan Medoxomil]     Unsure of reaction type  . Clarithromycin Other (See Comments)    Blisters in mouth  . Clarithromycin Other (See Comments)    Unknown   . Norvasc [Amlodipine Besylate]   . Levofloxacin Rash  . Zithromax [Azithromycin Dihydrate] Rash    REVIEW OF SYSTEMS:  CONSTITUTIONAL: No  fever, fatigue or weakness.  EYES: No blurred or double vision.  EARS, NOSE, AND THROAT: No tinnitus or ear pain.  RESPIRATORY: No cough, shortness of breath, wheezing or hemoptysis.  CARDIOVASCULAR: No chest pain, orthopnea, edema.  GASTROINTESTINAL: No nausea, vomiting, diarrhea or abdominal pain.  GENITOURINARY: No dysuria, hematuria.  ENDOCRINE: No polyuria, nocturia,  HEMATOLOGY: No anemia, easy bruising or bleeding SKIN: No rash or lesion. MUSCULOSKELETAL: No joint pain or arthritis.  Reports back pain in the upper back area close to the neck NEUROLOGIC: No tingling, numbness, weakness.  PSYCHIATRY: No anxiety or depression.   MEDICATIONS AT HOME:   Prior to Admission medications   Medication Sig Start Date End Date Taking? Authorizing Provider  acetaminophen (TYLENOL) 650 MG CR tablet Take 650 mg by mouth every 8 (eight) hours as needed for pain.     [provider]  ALPRAZolam Duanne Moron) 0.5 MG tablet Take 1 tablet (0.5 mg total) by mouth 2 (two) times daily as needed for anxiety. 02/07/18   [name], MD  apixaban (ELIQUIS) 2.5 MG TABS tablet Take 1 tablet (2.5 mg total) by mouth 2 (two) times daily. 02/12/18   Wellington Hampshire, MD  benzonatate (TESSALON) 200 MG capsule Take 1 capsule (200 mg total) by mouth 3 (three) times daily as needed for cough. 08/27/18   [name], MD  budesonide (ENTOCORT EC) 3 MG 24 hr capsule Take 1 capsule (3 mg total) by mouth daily. 09/18/18   [name], MD  budesonide (PULMICORT) 0.25 MG/2ML nebulizer solution USE ONE (1) VIAL IN NEBULIZER TWO TIMES PER DAY 08/21/18   [name], MD  cetirizine (ZYRTEC ALLERGY) 10 MG tablet Take 10 mg by mouth daily.    [provider]  Cholecalciferol (VITAMIN D-3) 1000 units CAPS Take 1,000 Units by mouth daily.     [provider]  diltiazem (CARDIZEM CD) 180 MG 24 hr capsule Take 1 capsule (180 mg total) by mouth daily. 09/16/18 11/15/18  [name], MD  docusate sodium  (COLACE) 100 MG capsule Take 1 capsule (100 mg total) by mouth 2 (two) times daily as needed for mild constipation. 08/26/18   Epifanio Lesches, MD  escitalopram (LEXAPRO) 10 MG tablet Take 1 tablet (10 mg total) by mouth daily. After dinner 01/29/18   [name], MD  fluticasone (FLONASE) 50 MCG/ACT nasal spray Place 2 sprays into the nose daily. Patient taking differently: Place 2 sprays into the nose daily as needed (For allergies.).  01/30/13   [name], MD  furosemide (LASIX) 40 MG tablet Take 80 mg by mouth daily.     [provider]  ipratropium-albuterol (DUONEB) 0.5-2.5 (3) MG/3ML SOLN Take 3 mLs by nebulization every 6 (six) hours as  needed. 08/27/18   [name], MD  levothyroxine (SYNTHROID, LEVOTHROID) 100 MCG tablet TAKE 1 TABLET BY MOUTH DAILY FOR THYROID 09/10/18   [name], MD  loperamide (IMODIUM) 2 MG capsule Take 1 capsule (2 mg total) by mouth as needed for diarrhea or loose stools. 09/15/18   [name], MD  losartan (COZAAR) 50 MG tablet TAKE ONE TABLET EVERY DAY 08/09/18   [name], MD  mupirocin ointment (BACTROBAN) 2 % Place 1 application into the nose 2 (two) times daily. 05/01/18   [name], MD  omeprazole (PRILOSEC) 40 MG capsule TAKE ONE CAPSULE BY MOUTH TWICE A DAY. BEFORE BREAKFAST AND SUPPER 03/13/18   [name], MD  ondansetron (ZOFRAN ODT) 4 MG disintegrating tablet Take 1 tablet (4 mg total) by mouth every 8 (eight) hours as needed for nausea or vomiting. 09/12/18   Nena Polio, MD  PROAIR HFA 108 [phone] BASE) MCG/ACT inhaler INHALE 2 PUFFS EVERY 6 HOURS AS NEEDED FOR WHEEZING 03/13/15   [name], MD  rOPINIRole (REQUIP) 0.25 MG tablet TAKE ONE TABLET 3 TIMES DAILY 07/26/18   [name], MD  Spacer/Aero-Holding Chambers [facility] ADVANTAGE) MISC 1 each by Other route once. Always uses her when you're using a metered-dose inhaler. You've aromatase medicine as much, he won't have his much side effect, but  you it twice as much medicine and your lungs. 06/08/15   Ahmed Prima, MD  temazepam (RESTORIL) 15 MG capsule TAKE 1 CAPSULE BY MOUTH AT BEDTIME AS NEEDED FOR SLEEP 06/13/18   [name], MD  vitamin B-12 (CYANOCOBALAMIN) 1000 MCG tablet Take 1,000 mcg by mouth daily.    [provider]      VITAL SIGNS:  Blood pressure (!) 117/49, pulse (!) 39, temperature 98.2 F (36.8 C), temperature source Oral, resp. rate 16, height 5' (1.524 m), weight 59.9 kg, SpO2 95 %.  PHYSICAL EXAMINATION:  GENERAL:  85 y.o.-year-old patient lying in the bed with no acute distress.  EYES: Pupils equal, round, reactive to light and accommodation. No scleral icterus. Extraocular muscles intact.  HEENT: Head atraumatic, normocephalic. Oropharynx and nasopharynx clear.  NECK:  Supple, no jugular venous distention. No thyroid enlargement, no tenderness.  LUNGS: Normal breath sounds bilaterally, no wheezing, rales,rhonchi or crepitation. No use of accessory muscles of respiration.  CARDIOVASCULAR: S1, S2 normal. No murmurs, rubs, or gallops.  ABDOMEN: Soft, nontender, nondistended. Bowel sounds present. EXTREMITIES: No pedal edema, cyanosis, or clubbing.  NEUROLOGIC: Awake, alert and oriented x3. Sensation intact. Gait not checked.  Upper back tenderness PSYCHIATRIC: The patient is alert and oriented x 3.  SKIN: No obvious rash, lesion, or ulcer.   LABORATORY PANEL:   CBC Recent Labs  Lab 09/26/18 1218  WBC 10.4  HGB 9.5*  HCT 32.0*  PLT 182   ------------------------------------------------------------------------------------------------------------------  Chemistries  Recent Labs  Lab 09/26/18 1218  NA 136  K 3.7  CL 104  CO2 22  GLUCOSE 127*  BUN 35*  CREATININE 1.53*  CALCIUM 8.4*   ------------------------------------------------------------------------------------------------------------------  Cardiac Enzymes Recent Labs  Lab 09/26/18 1218  TROPONINI 0.04*    ------------------------------------------------------------------------------------------------------------------  RADIOLOGY:  Dg Chest 1 View  Result Date: 09/26/2018 CLINICAL DATA:  Unwitnessed fall this morning. The patient was found down. EXAM: CHEST  1 VIEW COMPARISON:  09/11/2018, 08/27/2018 and 09/11/2017 and CT scan of the abdomen dated 08/23/2018 FINDINGS: Heart size and pulmonary vascularity are normal. Aortic atherosclerosis. Large hiatal hernia. The left pleural  effusion has resolved. Small right pleural effusion has diminished. No acute bone abnormality. IMPRESSION: 1. Improved right effusion.  Resolved left effusion. 2.  Aortic Atherosclerosis (ICD10-I70.0). 3. No acute abnormalities. 4. Large hiatal hernia, chronic. Electronically Signed   By: Lorriane Shire M.D.   On: 09/26/2018 13:58   Dg Forearm Right  Result Date: 09/26/2018 CLINICAL DATA:  Right arm pain secondary to a fall at home this morning. EXAM: RIGHT FOREARM - 2 VIEW COMPARISON:  None. FINDINGS: There is no fracture or dislocation. Slight calcific tendinopathy at the lateral epicondyle of the distal humerus. 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 Severe arthritis of the first [facility] joint. Multiple phleboliths in the forearm. IMPRESSION: No acute abnormality.  Chronic degenerative changes as described. Electronically Signed   By: Lorriane Shire M.D.   On: 09/26/2018 13:53   Ct Head Wo Contrast  Result Date: 09/26/2018 CLINICAL DATA:  Unwitnessed fall at home this morning at 0500 hours, laid on floor until EMS was called, fell onto face, unknown loss of consciousness, neck pain, history hypertension, atrial fibrillation, stage IV chronic kidney disease, CHF EXAM: CT HEAD WITHOUT CONTRAST CT MAXILLOFACIAL WITHOUT CONTRAST CT CERVICAL SPINE WITHOUT CONTRAST TECHNIQUE: Multidetector CT imaging of the head, cervical spine, and  maxillofacial structures were performed using the standard protocol without intravenous contrast. Multiplanar CT image reconstructions of the cervical spine and maxillofacial structures were also generated. Right side of face marked with BB. COMPARISON:  CT head 09/12/2018 CT cervical spine 09/11/2018 FINDINGS: CT HEAD FINDINGS Brain: Generalized atrophy. Normal ventricular morphology. No midline shift or mass effect. Small vessel chronic ischemic changes of deep cerebral white matter. No intracranial hemorrhage, mass lesion, evidence of acute infarction, or extra-axial fluid collection. Vascular: No hyperdense vessels. Atherosclerotic calcifications of internal carotid arteries at skull base. Skull: Frontal scalp hematoma.  Calvaria intact. Other: N/A CT MAXILLOFACIAL FINDINGS Osseous: Minimally displaced distal RIGHT nasal bone fracture. Scattered beam hardening artifacts of dental origin. TMJ alignment normal. Bony orbits and zygomas intact. No additional facial bone fractures identified. Orbits: Bony orbits intact. Intraorbital soft tissue planes clear bilaterally without fluid or pneumatosis Sinuses: Paranasal sinuses, mastoid air cells and middle ear cavities clear bilaterally Soft tissues: Frontal scalp hematoma extending RIGHT periorbital. Remaining facial soft tissues unremarkable. CT CERVICAL SPINE FINDINGS Alignment: Mild anterolisthesis C3-C4. Minimal retrolisthesis C4-C5 and C5-C6. Remaining alignments normal Skull base and vertebrae: Osseous demineralization. Multilevel facet degenerative changes. Disc space narrowing and endplate spur formation C4-C5 through C6-C7. Vertebral body heights maintained. Fractured spinous processes into her RIGHT lamina at C5 and at spinous process of C6. Uncovertebral spurs encroach upon the C5-C6 neural foramina bilaterally less at C4-C5 on LEFT. Soft tissues and spinal canal: Prevertebral soft tissues normal thickness. Atherosclerotic calcifications at the carotid  bifurcations bilaterally. Remaining cervical soft tissues unremarkable. Disc levels: Significant endplate spur formation and uncovertebral spurring narrow the LEFT C5-C6 neural foramen and also flattening the LEFT ventral aspect of the thecal sac and spinal cord. Upper chest: Small RIGHT pleural effusion. Lung apices otherwise clear. Other: N/A IMPRESSION: Atrophy with small vessel chronic ischemic changes of deep cerebral white matter. No acute intracranial abnormalities. Fractured spinous process C6. Fractured spinous process C5 into RIGHT lamina of C5. Multilevel degenerative disc and facet disease changes of the cervical spine. Compression of the LEFT ventral aspect of the thecal sac and spinal cord at C5-C6 by endplate and uncovertebral spur formation. Minimally displaced distal RIGHT  nasal bone fracture. No other facial bone fractures identified. Findings called to Dr.  Joni Fears on 09/26/2018 at 1342 hours. Electronically Signed   By: Lavonia Dana M.D.   On: 09/26/2018 13:42   Ct Cervical Spine Wo Contrast  Result Date: 09/26/2018 CLINICAL DATA:  Unwitnessed fall at home this morning at 0500 hours, laid on floor until EMS was called, fell onto face, unknown loss of consciousness, neck pain, history hypertension, atrial fibrillation, stage IV chronic kidney disease, CHF EXAM: CT HEAD WITHOUT CONTRAST CT MAXILLOFACIAL WITHOUT CONTRAST CT CERVICAL SPINE WITHOUT CONTRAST TECHNIQUE: Multidetector CT imaging of the head, cervical spine, and maxillofacial structures were performed using the standard protocol without intravenous contrast. Multiplanar CT image reconstructions of the cervical spine and maxillofacial structures were also generated. Right side of face marked with BB. COMPARISON:  CT head 09/12/2018 CT cervical spine 09/11/2018 FINDINGS: CT HEAD FINDINGS Brain: Generalized atrophy. Normal ventricular morphology. No midline shift or mass effect. Small vessel chronic ischemic changes of deep cerebral  white matter. No intracranial hemorrhage, mass lesion, evidence of acute infarction, or extra-axial fluid collection. Vascular: No hyperdense vessels. Atherosclerotic calcifications of internal carotid arteries at skull base. Skull: Frontal scalp hematoma.  Calvaria intact. Other: N/A CT MAXILLOFACIAL FINDINGS Osseous: Minimally displaced distal RIGHT nasal bone fracture. Scattered beam hardening artifacts of dental origin. TMJ alignment normal. Bony orbits and zygomas intact. No additional facial bone fractures identified. Orbits: Bony orbits intact. Intraorbital soft tissue planes clear bilaterally without fluid or pneumatosis Sinuses: Paranasal sinuses, mastoid air cells and middle ear cavities clear bilaterally Soft tissues: Frontal scalp hematoma extending RIGHT periorbital. Remaining facial soft tissues unremarkable. CT CERVICAL SPINE FINDINGS Alignment: Mild anterolisthesis C3-C4. Minimal retrolisthesis C4-C5 and C5-C6. Remaining alignments normal Skull base and vertebrae: Osseous demineralization. Multilevel facet degenerative changes. Disc space narrowing and endplate spur formation C4-C5 through C6-C7. Vertebral body heights maintained. Fractured spinous processes into her RIGHT lamina at C5 and at spinous process of C6. Uncovertebral spurs encroach upon the C5-C6 neural foramina bilaterally less at C4-C5 on LEFT. Soft tissues and spinal canal: Prevertebral soft tissues normal thickness. Atherosclerotic calcifications at the carotid bifurcations bilaterally. Remaining cervical soft tissues unremarkable. Disc levels: Significant endplate spur formation and uncovertebral spurring narrow the LEFT C5-C6 neural foramen and also flattening the LEFT ventral aspect of the thecal sac and spinal cord. Upper chest: Small RIGHT pleural effusion. Lung apices otherwise clear. Other: N/A IMPRESSION: Atrophy with small vessel chronic ischemic changes of deep cerebral white matter. No acute intracranial abnormalities.  Fractured spinous process C6. Fractured spinous process C5 into RIGHT lamina of C5. Multilevel degenerative disc and facet disease changes of the cervical spine. Compression of the LEFT ventral aspect of the thecal sac and spinal cord at C5-C6 by endplate and uncovertebral spur formation. Minimally displaced distal RIGHT nasal bone fracture. No other facial bone fractures identified. Findings called to Dr.  Joni Fears on 09/26/2018 at 1342 hours. Electronically Signed   By: Lavonia Dana M.D.   On: 09/26/2018 13:42   Ct Hip Right Wo Contrast  Result Date: 09/26/2018 CLINICAL DATA:  Right hip pain since an unwitnessed fall this morning. EXAM: CT OF THE RIGHT HIP WITHOUT CONTRAST TECHNIQUE: Multidetector CT imaging of the right hip was performed according to the standard protocol. Multiplanar CT image reconstructions were also generated. COMPARISON:  Radiographs dated 09/26/2018 FINDINGS: Bones/Joint/Cartilage There is no fracture or dislocation. Minimal marginal osteophytes on the right acetabulum. Slight calcifications at the gluteal tendon insertions on the right greater trochanter. No  effusion. Muscles and Tendons Normal. Soft tissues No acute abnormality.  Sigmoid diverticulosis. IMPRESSION: No acute abnormality of the right hip. Electronically Signed   By: Lorriane Shire M.D.   On: 09/26/2018 14:35   Dg Humerus Right  Result Date: 09/26/2018 CLINICAL DATA:  Right arm pain due to an unwitnessed fall at home this morning. EXAM: RIGHT HUMERUS - 2+ VIEW COMPARISON:  None. FINDINGS: There is no fracture or dislocation. There is slight chronic changes at the lateral epicondyle of the distal humerus as well as calcific tendinopathy at the rotator cuff of the right shoulder. Slight degenerative changes of the acromion with narrowing of the subacromial joint space consistent with rotator cuff disease. IMPRESSION: No acute abnormality of the right humerus. Electronically Signed   By: Lorriane Shire M.D.   On:  09/26/2018 13:51   Dg Hip Unilat W Or Wo Pelvis 2-3 Views Right  Result Date: 09/26/2018 CLINICAL DATA:  Right hip pain after a fall at home this morning. EXAM: DG HIP (WITH OR WITHOUT PELVIS) 2-3V RIGHT COMPARISON:  None. FINDINGS: There is no evidence of hip fracture or dislocation. There is no evidence of significant arthropathy or other focal bone abnormality of the right hip. Medial joint space narrowing of the left hip. IMPRESSION: No acute abnormalities.  Negative right hip. Electronically Signed   By: Lorriane Shire M.D.   On: 09/26/2018 13:55   Ct Maxillofacial Wo Contrast  Result Date: 09/26/2018 CLINICAL DATA:  Unwitnessed fall at home this morning at 0500 hours, laid on floor until EMS was called, fell onto face, unknown loss of consciousness, neck pain, history hypertension, atrial fibrillation, stage IV chronic kidney disease, CHF EXAM: CT HEAD WITHOUT CONTRAST CT MAXILLOFACIAL WITHOUT CONTRAST CT CERVICAL SPINE WITHOUT CONTRAST TECHNIQUE: Multidetector CT imaging of the head, cervical spine, and maxillofacial structures were performed using the standard protocol without intravenous contrast. Multiplanar CT image reconstructions of the cervical spine and maxillofacial structures were also generated. Right side of face marked with BB. COMPARISON:  CT head 09/12/2018 CT cervical spine 09/11/2018 FINDINGS: CT HEAD FINDINGS Brain: Generalized atrophy. Normal ventricular morphology. No midline shift or mass effect. Small vessel chronic ischemic changes of deep cerebral white matter. No intracranial hemorrhage, mass lesion, evidence of acute infarction, or extra-axial fluid collection. Vascular: No hyperdense vessels. Atherosclerotic calcifications of internal carotid arteries at skull base. Skull: Frontal scalp hematoma.  Calvaria intact. Other: N/A CT MAXILLOFACIAL FINDINGS Osseous: Minimally displaced distal RIGHT nasal bone fracture. Scattered beam hardening artifacts of dental origin. TMJ  alignment normal. Bony orbits and zygomas intact. No additional facial bone fractures identified. Orbits: Bony orbits intact. Intraorbital soft tissue planes clear bilaterally without fluid or pneumatosis Sinuses: Paranasal sinuses, mastoid air cells and middle ear cavities clear bilaterally Soft tissues: Frontal scalp hematoma extending RIGHT periorbital. Remaining facial soft tissues unremarkable. CT CERVICAL SPINE FINDINGS Alignment: Mild anterolisthesis C3-C4. Minimal retrolisthesis C4-C5 and C5-C6. Remaining alignments normal Skull base and vertebrae: Osseous demineralization. Multilevel facet degenerative changes. Disc space narrowing and endplate spur formation C4-C5 through C6-C7. Vertebral body heights maintained. Fractured spinous processes into her RIGHT lamina at C5 and at spinous process of C6. Uncovertebral spurs encroach upon the C5-C6 neural foramina bilaterally less at C4-C5 on LEFT. Soft tissues and spinal canal: Prevertebral soft tissues normal thickness. Atherosclerotic calcifications at the carotid bifurcations bilaterally. Remaining cervical soft tissues unremarkable. Disc levels: Significant endplate spur formation and uncovertebral spurring narrow the LEFT C5-C6 neural foramen and also flattening the LEFT ventral aspect of the  thecal sac and spinal cord. Upper chest: Small RIGHT pleural effusion. Lung apices otherwise clear. Other: N/A IMPRESSION: Atrophy with small vessel chronic ischemic changes of deep cerebral white matter. No acute intracranial abnormalities. Fractured spinous process C6. Fractured spinous process C5 into RIGHT lamina of C5. Multilevel degenerative disc and facet disease changes of the cervical spine. Compression of the LEFT ventral aspect of the thecal sac and spinal cord at C5-C6 by endplate and uncovertebral spur formation. Minimally displaced distal RIGHT nasal bone fracture. No other facial bone fractures identified. Findings called to Dr.  Joni Fears on 09/26/2018 at  1342 hours. Electronically Signed   By: Lavonia Dana M.D.   On: 09/26/2018 13:42    EKG:   Orders placed or performed during the hospital encounter of 09/12/18  . ED EKG  . ED EKG  . ED EKG  . ED EKG  . EKG 12-Lead  . EKG 12-Lead    IMPRESSION AND PLAN:    #Acute rhabdomyolysis from the mechanical fall Admit to MedSurg unit Pain management as needed Hydrate with IV fluids Serial CK  #Acute C5-C6 spinous process fracture Neurosurgery consult placed and discussed with Dr. Cari Caraway recommending outpatient follow-up and okay to do PT assessment cervical collar as needed for comfort Pain management as needed  #Chronic kidney disease stage IV Stable at this time hydrate with IV fluids and avoid nephrotoxins  #Sinus bradycardia with hypotension Dopamine drip Hold rate limiting drugs  cardiology consult placed to primary cardiology Dr. Rockey Situ, await of the consult  #Chronic atrial fibrillation rate controlled Continue sotalol and Eliquis per pharmacy  #Hypothyroidism continue Synthyroid  DVT prophylaxis Eliquis  Will admit patient to unit   All the records are reviewed and case discussed with ED provider. Management plans discussed with the patient, family and they are in agreement.  CODE STATUS: DNR  TOTAL TIME TAKING CARE OF THIS PATIENT: 45  minutes.   Note: This dictation was prepared with Dragon dictation along with smaller phrase technology. Any transcriptional errors that result from this process are unintentional.  [name] M.D on 09/26/2018 at 3:47 PM  Between 7am to 6pm - Pager - [phone]  After 6pm go to [email or web address] - password [email or web address] [facility]  Office  [phone]  CC: Primary care physician; [name], MD

## 2018-09-26 NOTE — ED Triage Notes (Signed)
Pt arrived via ems from home after unwitnessed fall approximately at 0500 this morning. Pt laid on floor until ems was called. Pt fell on her face with unknown loc. PT reports neck pain and arrives in a soft c collar.

## 2018-09-27 ENCOUNTER — Inpatient Hospital Stay: Payer: PPO

## 2018-09-27 DIAGNOSIS — I4819 Other persistent atrial fibrillation: Secondary | ICD-10-CM

## 2018-09-27 DIAGNOSIS — T796XXA Traumatic ischemia of muscle, initial encounter: Principal | ICD-10-CM

## 2018-09-27 DIAGNOSIS — M6282 Rhabdomyolysis: Secondary | ICD-10-CM

## 2018-09-27 DIAGNOSIS — I959 Hypotension, unspecified: Secondary | ICD-10-CM

## 2018-09-27 DIAGNOSIS — I5032 Chronic diastolic (congestive) heart failure: Secondary | ICD-10-CM

## 2018-09-27 LAB — COMPREHENSIVE METABOLIC PANEL
ALT: 28 U/L (ref 0–44)
ANION GAP: 9 (ref 5–15)
AST: 52 U/L — ABNORMAL HIGH (ref 15–41)
Albumin: 2.9 g/dL — ABNORMAL LOW (ref 3.5–5.0)
Alkaline Phosphatase: 71 U/L (ref 38–126)
BUN: 36 mg/dL — ABNORMAL HIGH (ref 8–23)
CALCIUM: 8.4 mg/dL — AB (ref 8.9–10.3)
CO2: 23 mmol/L (ref 22–32)
CREATININE: 1.54 mg/dL — AB (ref 0.44–1.00)
Chloride: 105 mmol/L (ref 98–111)
GFR calc non Af Amer: 30 mL/min — ABNORMAL LOW (ref >60)
GFR, EST AFRICAN AMERICAN: 35 mL/min — AB (ref >60)
Glucose, Bld: 113 mg/dL — ABNORMAL HIGH (ref 70–99)
Potassium: 3.8 mmol/L (ref 3.5–5.1)
Sodium: 137 mmol/L (ref 135–145)
Total Bilirubin: 2.2 mg/dL — ABNORMAL HIGH (ref 0.3–1.2)
Total Protein: 5.9 g/dL — ABNORMAL LOW (ref 6.5–8.1)

## 2018-09-27 LAB — CBC
HCT: 33.6 % — ABNORMAL LOW (ref 36.0–46.0)
Hemoglobin: 10.1 g/dL — ABNORMAL LOW (ref 12.0–15.0)
MCH: 27.7 pg (ref 26.0–34.0)
MCHC: 30.1 g/dL (ref 30.0–36.0)
MCV: 92.3 fL (ref 80.0–100.0)
NRBC: 0 % (ref 0.0–0.2)
Platelets: 243 10*3/uL (ref 150–400)
RBC: 3.64 MIL/uL — ABNORMAL LOW (ref 3.87–5.11)
RDW: 16.6 % — ABNORMAL HIGH (ref 11.5–15.5)
WBC: 13.5 10*3/uL — ABNORMAL HIGH (ref 4.0–10.5)

## 2018-09-27 LAB — PROTIME-INR
INR: 1.4
Prothrombin Time: 17 seconds — ABNORMAL HIGH (ref 11.4–15.2)

## 2018-09-27 LAB — MRSA PCR SCREENING: MRSA by PCR: NEGATIVE

## 2018-09-27 LAB — TROPONIN I: Troponin I: 0.05 ng/mL (ref <0.03)

## 2018-09-27 LAB — TSH: TSH: 0.148 u[IU]/mL — ABNORMAL LOW (ref 0.350–4.500)

## 2018-09-27 LAB — GLUCOSE, CAPILLARY: Glucose-Capillary: 111 mg/dL — ABNORMAL HIGH (ref 70–99)

## 2018-09-27 LAB — CK: Total CK: 652 U/L — ABNORMAL HIGH (ref 38–234)

## 2018-09-27 MED ORDER — LACTATED RINGERS IV SOLN
INTRAVENOUS | Status: DC
  Administered 2018-09-27: 12:00:00 via INTRAVENOUS

## 2018-09-27 MED ORDER — TEMAZEPAM 15 MG PO CAPS
15.0000 mg | ORAL_CAPSULE | Freq: Every evening | ORAL | Status: DC | PRN
  Administered 2018-09-27 – 2018-10-02 (×4): 15 mg via ORAL
  Filled 2018-09-27 (×4): qty 1

## 2018-09-27 MED ORDER — ONDANSETRON HCL 4 MG/2ML IJ SOLN
4.0000 mg | Freq: Once | INTRAMUSCULAR | Status: AC
  Administered 2018-09-27: 4 mg via INTRAVENOUS

## 2018-09-27 MED ORDER — DOPAMINE-DEXTROSE 3.2-5 MG/ML-% IV SOLN: 0.0000 ug/kg/min | INTRAVENOUS | Status: DC

## 2018-09-27 NOTE — Progress Notes (Signed)
No distress.  No new complaints.  Cognition intact  Vitals:   09/27/18 1000 09/27/18 1100 09/27/18 1200 09/27/18 1300  BP: (!) 93/42 (!) 93/43 (!) 94/45 (!) 88/36  Pulse: (!) 59 (!) 55 66 (!) 56  Resp: 18 12 16 16   Temp:      TempSrc:      SpO2: 93% 92% 92% 92%  Weight:      Height:      RA  Gen: NAD HEENT: NCAT, sclera white, periorbital contusions Neck: No JVD Lungs: breath sounds full, no wheezes or other adventitious sounds Cardiovascular: Bradycardic, regular, no murmurs Abdomen: Soft, nontender, normal BS Ext: without clubbing, cyanosis, edema Neuro: grossly intact Skin: Limited exam, no lesions noted  BMP Latest Ref Rng & Units 09/27/2018 09/26/2018 09/14/2018  Glucose 70 - 99 mg/dL 113(H) 127(H) 112(H)  BUN 8 - 23 mg/dL 36(H) 35(H) 48(H)  Creatinine 0.44 - 1.00 mg/dL 1.54(H) 1.53(H) 1.82(H)  BUN/Creat Ratio 12 - 28 - - -  Sodium 135 - 145 mmol/L 137 136 137  Potassium 3.5 - 5.1 mmol/L 3.8 3.7 3.7  Chloride 98 - 111 mmol/L 105 104 105  CO2 22 - 32 mmol/L 23 22 24   Calcium 8.9 - 10.3 mg/dL 8.4(L) 8.4(L) 8.3(L)    CBC Latest Ref Rng & Units 09/27/2018 09/26/2018 09/14/2018  WBC 4.0 - 10.5 K/uL 13.5(H) 10.4 7.6  Hemoglobin 12.0 - 15.0 g/dL 10.1(L) 9.5(L) 8.7(L)  Hematocrit 36.0 - 46.0 % 33.6(L) 32.0(L) 28.9(L)  Platelets 150 - 400 K/uL 243 182 438(H)    CXR: Borderline cardiomegaly, minimal R pleural effusion  IMPRESSION: History of paroxysmal atrial fibrillation on diltiazem Severe sinus bradycardia and hypotension requiring dopamine History of diastolic heart failure D3-T7 spinous process fracture after fall Mild rhabdomyolysis CKD   PLAN/REC: Wean off of dopamine as permitted by HR and BP Advance diet and activity Monitor BMET intermittently Monitor I/Os Correct electrolytes as indicated Patient is DNR/DNI   Merton Border, MD PCCM service Mobile [phone] Pager [phone] 09/27/2018 2:20 PM

## 2018-09-27 NOTE — Progress Notes (Signed)
PT Cancellation Note  Patient Details Name: Savannah Wilson MRN: [id number] DOB: September 22, 1928   Cancelled Treatment:    Reason Eval/Treat Not Completed: Patient not medically ready.  Order received.  Chart reviewed.  Per RN, pt is still on dopamine drip which should be complete by 1100.  Will re-attempt when pt is appropriate.  Roxanne Gates, PT, DPT  Roxanne Gates 09/27/2018, 8:25 AM

## 2018-09-27 NOTE — Evaluation (Signed)
Physical Therapy Evaluation Patient Details Name: Savannah Wilson MRN: [id number] DOB: 1929/05/06 Today's Date: 09/27/2018   History of Present Illness  Pt is an 85 year old female admitted with rhabdomyolysis and bradycardia following a fall in her home.  Pt was down in her floor for 5 hrs before she was found and has been found to have C5/6 fracture.  PMH includes CKD, depression, atrial fibrillation, Htn, HLD.  Clinical Impression  Pt is an 85 year old female who lives in a one story home alone.  She has been independent and ambulates with a RW at baseline.  Pt in bed reporting pain with visible bruising of both eyes and chin as a result of recent fall.  Pt demonstrated fair strength of L UE/LE but was unable to initiate volitional movement of R shoulder, wrist, grip or LE.  She also reported pain but decreased sensation to touch.  Pt nauseated during evaluation and O2 desat to 88% during supine there ex of L LE.  Pt demonstrated understanding of there ex but was very limited in tolerance to each exercise.  She was open to all education.  Pt will continue to benefit from skilled PT with focus on strength, tolerance to activity, pain management, safe functional mobility and prevention of future falls.  Due to significant change in mobility, pt will benefit from SNF placement following discharge.    Follow Up Recommendations SNF    Equipment Recommendations       Recommendations for Other Services       Precautions / Restrictions Precautions Precautions: Fall Restrictions Weight Bearing Restrictions: No      Mobility  Bed Mobility Overal bed mobility: (Not assessed due to nausea, pain, O2 desat.)                Transfers                    Ambulation/Gait                Stairs            Wheelchair Mobility    Modified Rankin (Stroke Patients Only)       Balance Overall balance assessment: History of Falls                                            Pertinent Vitals/Pain      Home Living Family/patient expects to be discharged to:: Private residence Living Arrangements: Alone Available Help at Discharge: Family Type of Home: House Home Access: Stairs to enter Entrance Stairs-Rails: Right Entrance Stairs-Number of Steps: 1 step  Home Layout: One level Home Equipment: Environmental consultant - 2 wheels;Cane - quad;Shower seat;Grab bars - tub/shower;Grab bars - toilet      Prior Function Level of Independence: Independent         Comments: Pt has been independent but states that she has recently had difficulty in getting up from the toilet.     Hand Dominance   Dominant Hand: Right    Extremity/Trunk Assessment   Upper Extremity Assessment Upper Extremity Assessment: Generalized weakness;RUE deficits/detail RUE Deficits / Details: Elbow flexion/extension: 3/5, grip, wrist, shoulder flexion: 0/5, pt reported feeling of numbness to touch but also is feeling pain on R side RUE: Unable to fully assess due to pain RUE Sensation: decreased light touch    Lower Extremity Assessment Lower Extremity  Assessment: Generalized weakness;RLE deficits/detail RLE Deficits / Details: R LE: 0/0 RLE: Unable to fully assess due to pain RLE Sensation: decreased light touch    Cervical / Trunk Assessment Cervical / Trunk Assessment: (Not assessed; pt unable to sit up.)  Communication   Communication: No difficulties  Cognition Arousal/Alertness: Awake/alert Behavior During Therapy: WFL for tasks assessed/performed Overall Cognitive Status: Within Functional Limits for tasks assessed                                 General Comments: Pt nauseated and with emesis bag upon PT arrival.  Alert and able to follow commands but visibly not feeling well.      General Comments      Exercises Other Exercises Other Exercises: Reviewed ankle pumps, quad sets, hip abduction/adduction and SLR which pt was able to perform  on L side <5 reps x4 min Other Exercises: Discussed benefit of SNF and HEP x 57min   Assessment/Plan    PT Assessment Patient needs continued PT services  PT Problem List Decreased strength;Decreased range of motion;Decreased activity tolerance;Decreased balance;Decreased mobility;Decreased coordination;Decreased knowledge of use of DME;Decreased safety awareness;Cardiopulmonary status limiting activity       PT Treatment Interventions DME instruction;Gait training;Functional mobility training;Therapeutic activities;Therapeutic exercise;Balance training;Neuromuscular re-education;Patient/family education;Stair training    PT Goals (Current goals can be found in the Care Plan section)  Acute Rehab PT Goals Patient Stated Goal: To manage pain PT Goal Formulation: With patient Time For Goal Achievement: 10/11/18 Potential to Achieve Goals: Good    Frequency Min 2X/week   Barriers to discharge Inaccessible home environment      Co-evaluation               AM-PAC PT "6 Clicks" Mobility  Outcome Measure Help needed turning from your back to your side while in a flat bed without using bedrails?: A Lot Help needed moving from lying on your back to sitting on the side of a flat bed without using bedrails?: A Lot Help needed moving to and from a bed to a chair (including a wheelchair)?: A Lot Help needed standing up from a chair using your arms (e.g., wheelchair or bedside chair)?: Total Help needed to walk in hospital room?: Total Help needed climbing 3-5 steps with a railing? : Total 6 Click Score: 9    End of Session Equipment Utilized During Treatment: Gait belt Activity Tolerance: Patient limited by pain Patient left: in bed;with call bell/phone within reach;with nursing/sitter in room;with bed alarm set Nurse Communication: Mobility status;Other (comment)(including drop in O2 sats) PT Visit Diagnosis: Unsteadiness on feet (R26.81);Muscle weakness (generalized) (M62.81)     Time: 6789-3810 PT Time Calculation (min) (ACUTE ONLY): 21 min   Charges:   PT Evaluation $PT Eval Moderate Complexity: 1 Mod PT Treatments $Therapeutic Activity: 8-22 mins        Roxanne Gates, PT, DPT    Roxanne Gates 09/27/2018, 1:48 PM

## 2018-09-27 NOTE — Progress Notes (Signed)
[facility] [place] NAME: Savannah Wilson    MR#:  [id number]  DATE OF BIRTH:  1929/01/21  SUBJECTIVE:  CHIEF COMPLAINT:   Chief Complaint  Patient presents with  . Fall   Shoulder pain, bruised around eyeballs and tired, low HR,BP needing pressors REVIEW OF SYSTEMS:  Review of Systems  Constitutional: Positive for malaise/fatigue. Negative for diaphoresis, fever and weight loss.  HENT: Negative for ear discharge, ear pain, hearing loss, nosebleeds, sore throat and tinnitus.   Eyes: Negative for blurred vision and pain.  Respiratory: Negative for cough, hemoptysis, shortness of breath and wheezing.   Cardiovascular: Negative for chest pain, palpitations, orthopnea and leg swelling.  Gastrointestinal: Negative for abdominal pain, blood in stool, constipation, diarrhea, heartburn, nausea and vomiting.  Genitourinary: Negative for dysuria, frequency and urgency.  Musculoskeletal: Positive for falls and joint pain. Negative for back pain and myalgias.  Skin: Negative for itching and rash.  Neurological: Negative for dizziness, tingling, tremors, focal weakness, seizures, weakness and headaches.  Psychiatric/Behavioral: Negative for depression. The patient is not nervous/anxious.    DRUG ALLERGIES:   Allergies  Allergen Reactions  . Promethazine Anxiety, Palpitations and Other (See Comments)    IV Promethazine 6.25 mg. JIttery, anxious, couldn't lay still, increase HR  . Ace Inhibitors Cough  . Benicar [Olmesartan Medoxomil]     Unsure of reaction type  . Clarithromycin Other (See Comments)    Blisters in mouth  . Clarithromycin Other (See Comments)    Unknown   . Norvasc [Amlodipine Besylate]   . Levofloxacin Rash  . Zithromax [Azithromycin Dihydrate] Rash   VITALS:  Blood pressure (!) 100/46, pulse (!) 59, temperature 98.3 F (36.8 C), temperature source Axillary, resp. rate 13, height 5' (1.524 m), weight 62.2 kg, SpO2 97  %. PHYSICAL EXAMINATION:  Physical Exam HENT:     Head: Normocephalic and atraumatic.  Eyes:     Conjunctiva/sclera: Conjunctivae normal.     Pupils: Pupils are equal, round, and reactive to light.     Comments: Facial bruising and ecchymosis around eyes  Neck:     Musculoskeletal: Normal range of motion and neck supple.     Thyroid: No thyromegaly.     Trachea: No tracheal deviation.  Cardiovascular:     Rate and Rhythm: Normal rate and regular rhythm.     Heart sounds: Normal heart sounds.  Pulmonary:     Effort: Pulmonary effort is normal. No respiratory distress.     Breath sounds: Normal breath sounds. No wheezing.  Chest:     Chest wall: No tenderness.  Abdominal:     General: Bowel sounds are normal. There is no distension.     Palpations: Abdomen is soft.     Tenderness: There is no abdominal tenderness.  Musculoskeletal: Normal range of motion.  Skin:    General: Skin is warm and dry.     Findings: No rash.  Neurological:     Mental Status: She is alert and oriented to person, place, and time.     Cranial Nerves: No cranial nerve deficit.    LABORATORY PANEL:  Female CBC Recent Labs  Lab 09/27/18 0513  WBC 13.5*  HGB 10.1*  HCT 33.6*  PLT 243   ------------------------------------------------------------------------------------------------------------------ Chemistries  Recent Labs  Lab 09/27/18 0513  NA 137  K 3.8  CL 105  CO2 23  GLUCOSE 113*  BUN 36*  CREATININE 1.54*  CALCIUM 8.4*  AST 52*  ALT 28  ALKPHOS 71  BILITOT 2.2*   RADIOLOGY:  Dg Abd 1 View  Result Date: 09/27/2018 CLINICAL DATA:  Vomiting EXAM: ABDOMEN - 1 VIEW COMPARISON:  Abdominal CT from 15 days ago FINDINGS: Nonobstructive bowel gas pattern. No abnormal stool retention or rectal impaction. No concerning mass effect or calcification. Bones are osteopenic and there is rotation and scoliosis. IMPRESSION: Nonobstructive bowel gas pattern.  No abnormal stool retention.  Electronically Signed   By: Monte Fantasia M.D.   On: 09/27/2018 04:22   ASSESSMENT AND PLAN:  85 y.o. female with a history of paroxysmal atrial fibrillation, chronic diastolic congestive heart failure, stage IV chronic kidney disease, asthma/COPD, hypertension, hypothyroidism, anemia of chronic disease, and Barrett's esophagus, admitted s/p fall   1.  Sinus bradycardia, hypotension: needed pressors (dopamine) last night due to hypotension. Off Diltiazem  2.  PAF:  cardio following. on eliquis  3.  Rhabdomyolysis s/p mechanical fall: monitor CK while on IVFs  4.  HFpEF:  Euvolemic. HR/BP stable.  5.  CKD IV:  Creat stable.  6. Acute C5-6 spinous process fx:  Conservative mgmt per neurosurgery - okay to do PT assessment cervical collar as needed for comfort Pain management as needed  7. Chronic kidney disease stage IV - monitor and avoid nephrotoxins  8. Hypothyroidism continue Synthyroid     All the records are reviewed and case discussed with Care Management/Social Worker. Management plans discussed with the patient, nursing and they are in agreement.  CODE STATUS: DNR  TOTAL TIME TAKING CARE OF THIS PATIENT: 15 minutes.   More than 50% of the time was spent in counseling/coordination of care: YES  POSSIBLE D/C IN 3-4 DAYS, DEPENDING ON CLINICAL CONDITION.   Max Sane M.D on 09/27/2018 at 2:48 PM  Between 7am to 6pm - Pager - [phone]  After 6pm go to [email or web address] - Proofreader  [facility]  Office  [phone]  CC: Primary care physician; [name], MD  Note: This dictation was prepared with Dragon dictation along with smaller phrase technology. Any transcriptional errors that result from this process are unintentional.

## 2018-09-27 NOTE — Progress Notes (Signed)
Patient refuses to wear collar brace- Dr. Alva Garnet aware.

## 2018-09-27 NOTE — Progress Notes (Signed)
Progress Note  Patient Name: Savannah Wilson Date of Encounter: 09/27/2018  Primary Cardiologist: Kathlyn Sacramento, MD  Subjective   Overall feeling better but still with significant shoulder pain.  She is more alert today.  She says that she is certain that she simply lost her balance prior to falling and denies presyncope/syncope.  HR/BP stable on low dose dopamine overnight.  Inpatient Medications    Scheduled Meds: . apixaban  2.5 mg Oral BID  . docusate sodium  100 mg Oral BID   Continuous Infusions: . lactated ringers 50 mL/hr at 09/27/18 1201   PRN Meds: acetaminophen **OR** acetaminophen, bisacodyl, fentaNYL (SUBLIMAZE) injection, [DISCONTINUED] ondansetron **OR** ondansetron (ZOFRAN) IV, traMADol   Vital Signs    Vitals:   09/27/18 0811 09/27/18 1000 09/27/18 1100 09/27/18 1200  BP:  (!) 93/42 (!) 93/43 (!) 94/45  Pulse: 71 (!) 59 (!) 55 66  Resp: 16 18 12 16   Temp: 97.8 F (36.6 C)     TempSrc: Oral     SpO2: 94% 93% 92% 92%  Weight:      Height:        Intake/Output Summary (Last 24 hours) at 09/27/2018 1208 Last data filed at 09/27/2018 1203 Gross per 24 hour  Intake 1091.48 ml  Output 175 ml  Net 916.48 ml   Filed Weights   09/26/18 1207 09/26/18 2246  Weight: 59.9 kg 62.2 kg    Physical Exam   GEN: Well nourished, well developed, in no acute distress.  HEENT: Significant facial bruising. Neck: Supple, no JVD, carotid bruits, or masses. Cardiac: RRR, no murmurs, rubs, or gallops. No clubbing, cyanosis, edema.  Radials/DP/PT 2+ and equal bilaterally.  Respiratory:  Respirations regular and unlabored, clear to auscultation bilaterally. GI: Soft, nontender, nondistended, BS + x 4. MS: no deformity or atrophy. Significant bilat shoulder pain/tenderness. Skin: warm and dry, no rash. Neuro:  Strength and sensation are intact. Psych: AAOx3.  Normal affect.  Labs    Chemistry Recent Labs  Lab 09/26/18 1218 09/27/18 0513  NA 136 137  K 3.7 3.8    CL 104 105  CO2 22 23  GLUCOSE 127* 113*  BUN 35* 36*  CREATININE 1.53* 1.54*  CALCIUM 8.4* 8.4*  PROT  --  5.9*  ALBUMIN  --  2.9*  AST  --  52*  ALT  --  28  ALKPHOS  --  71  BILITOT  --  2.2*  GFRNONAA 30* 30*  GFRAA 35* 35*  ANIONGAP 10 9     Hematology Recent Labs  Lab 09/26/18 1218 09/27/18 0513  WBC 10.4 13.5*  RBC 3.45* 3.64*  HGB 9.5* 10.1*  HCT 32.0* 33.6*  MCV 92.8 92.3  MCH 27.5 27.7  MCHC 29.7* 30.1  RDW 17.1* 16.6*  PLT 182 243    Cardiac Enzymes Recent Labs  Lab 09/26/18 1218 09/27/18 0513  TROPONINI 0.04* 0.05*      Radiology    Dg Chest 1 View  Result Date: 09/26/2018 CLINICAL DATA:  Unwitnessed fall this morning. The patient was found down. EXAM: CHEST  1 VIEW COMPARISON:  09/11/2018, 08/27/2018 and 09/11/2017 and CT scan of the abdomen dated 08/23/2018 FINDINGS: Heart size and pulmonary vascularity are normal. Aortic atherosclerosis. Large hiatal hernia. The left pleural effusion has resolved. Small right pleural effusion has diminished. No acute bone abnormality. IMPRESSION: 1. Improved right effusion.  Resolved left effusion. 2.  Aortic Atherosclerosis (ICD10-I70.0). 3. No acute abnormalities. 4. Large hiatal hernia, chronic. Electronically Signed  By: Lorriane Shire M.D.   On: 09/26/2018 13:58   Dg Forearm Right  Result Date: 09/26/2018 CLINICAL DATA:  Right arm pain secondary to a fall at home this morning. EXAM: RIGHT FOREARM - 2 VIEW COMPARISON:  None. FINDINGS: There is no fracture or dislocation. Slight calcific tendinopathy at the lateral epicondyle of the distal humerus. 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 Severe arthritis of the first [facility] joint. Multiple phleboliths in the forearm. IMPRESSION: No acute abnormality.  Chronic degenerative changes as described. Electronically Signed   By: Lorriane Shire M.D.   On: 09/26/2018 13:53   Dg Abd 1  View  Result Date: 09/27/2018 CLINICAL DATA:  Vomiting EXAM: ABDOMEN - 1 VIEW COMPARISON:  Abdominal CT from 15 days ago FINDINGS: Nonobstructive bowel gas pattern. No abnormal stool retention or rectal impaction. No concerning mass effect or calcification. Bones are osteopenic and there is rotation and scoliosis. IMPRESSION: Nonobstructive bowel gas pattern.  No abnormal stool retention. Electronically Signed   By: Monte Fantasia M.D.   On: 09/27/2018 04:22   Ct Head Wo Contrast  Result Date: 09/26/2018 CLINICAL DATA:  Unwitnessed fall at home this morning at 0500 hours, laid on floor until EMS was called, fell onto face, unknown loss of consciousness, neck pain, history hypertension, atrial fibrillation, stage IV chronic kidney disease, CHF EXAM: CT HEAD WITHOUT CONTRAST CT MAXILLOFACIAL WITHOUT CONTRAST CT CERVICAL SPINE WITHOUT CONTRAST TECHNIQUE: Multidetector CT imaging of the head, cervical spine, and maxillofacial structures were performed using the standard protocol without intravenous contrast. Multiplanar CT image reconstructions of the cervical spine and maxillofacial structures were also generated. Right side of face marked with BB. COMPARISON:  CT head 09/12/2018 CT cervical spine 09/11/2018 FINDINGS: CT HEAD FINDINGS Brain: Generalized atrophy. Normal ventricular morphology. No midline shift or mass effect. Small vessel chronic ischemic changes of deep cerebral white matter. No intracranial hemorrhage, mass lesion, evidence of acute infarction, or extra-axial fluid collection. Vascular: No hyperdense vessels. Atherosclerotic calcifications of internal carotid arteries at skull base. Skull: Frontal scalp hematoma.  Calvaria intact. Other: N/A CT MAXILLOFACIAL FINDINGS Osseous: Minimally displaced distal RIGHT nasal bone fracture. Scattered beam hardening artifacts of dental origin. TMJ alignment normal. Bony orbits and zygomas intact. No additional facial bone fractures identified. Orbits: Bony  orbits intact. Intraorbital soft tissue planes clear bilaterally without fluid or pneumatosis Sinuses: Paranasal sinuses, mastoid air cells and middle ear cavities clear bilaterally Soft tissues: Frontal scalp hematoma extending RIGHT periorbital. Remaining facial soft tissues unremarkable. CT CERVICAL SPINE FINDINGS Alignment: Mild anterolisthesis C3-C4. Minimal retrolisthesis C4-C5 and C5-C6. Remaining alignments normal Skull base and vertebrae: Osseous demineralization. Multilevel facet degenerative changes. Disc space narrowing and endplate spur formation C4-C5 through C6-C7. Vertebral body heights maintained. Fractured spinous processes into her RIGHT lamina at C5 and at spinous process of C6. Uncovertebral spurs encroach upon the C5-C6 neural foramina bilaterally less at C4-C5 on LEFT. Soft tissues and spinal canal: Prevertebral soft tissues normal thickness. Atherosclerotic calcifications at the carotid bifurcations bilaterally. Remaining cervical soft tissues unremarkable. Disc levels: Significant endplate spur formation and uncovertebral spurring narrow the LEFT C5-C6 neural foramen and also flattening the LEFT ventral aspect of the thecal sac and spinal cord. Upper chest: Small RIGHT pleural effusion. Lung apices otherwise clear. Other: N/A IMPRESSION: Atrophy with small vessel chronic ischemic changes of deep cerebral white matter. No acute intracranial abnormalities. Fractured spinous process C6. Fractured spinous process C5 into RIGHT lamina  of C5. Multilevel degenerative disc and facet disease changes of the cervical spine. Compression of the LEFT ventral aspect of the thecal sac and spinal cord at C5-C6 by endplate and uncovertebral spur formation. Minimally displaced distal RIGHT nasal bone fracture. No other facial bone fractures identified. Findings called to Dr.  Joni Fears on 09/26/2018 at 1342 hours. Electronically Signed   By: Lavonia Dana M.D.   On: 09/26/2018 13:42   Ct Cervical Spine Wo  Contrast  Result Date: 09/26/2018 CLINICAL DATA:  Unwitnessed fall at home this morning at 0500 hours, laid on floor until EMS was called, fell onto face, unknown loss of consciousness, neck pain, history hypertension, atrial fibrillation, stage IV chronic kidney disease, CHF EXAM: CT HEAD WITHOUT CONTRAST CT MAXILLOFACIAL WITHOUT CONTRAST CT CERVICAL SPINE WITHOUT CONTRAST TECHNIQUE: Multidetector CT imaging of the head, cervical spine, and maxillofacial structures were performed using the standard protocol without intravenous contrast. Multiplanar CT image reconstructions of the cervical spine and maxillofacial structures were also generated. Right side of face marked with BB. COMPARISON:  CT head 09/12/2018 CT cervical spine 09/11/2018 FINDINGS: CT HEAD FINDINGS Brain: Generalized atrophy. Normal ventricular morphology. No midline shift or mass effect. Small vessel chronic ischemic changes of deep cerebral white matter. No intracranial hemorrhage, mass lesion, evidence of acute infarction, or extra-axial fluid collection. Vascular: No hyperdense vessels. Atherosclerotic calcifications of internal carotid arteries at skull base. Skull: Frontal scalp hematoma.  Calvaria intact. Other: N/A CT MAXILLOFACIAL FINDINGS Osseous: Minimally displaced distal RIGHT nasal bone fracture. Scattered beam hardening artifacts of dental origin. TMJ alignment normal. Bony orbits and zygomas intact. No additional facial bone fractures identified. Orbits: Bony orbits intact. Intraorbital soft tissue planes clear bilaterally without fluid or pneumatosis Sinuses: Paranasal sinuses, mastoid air cells and middle ear cavities clear bilaterally Soft tissues: Frontal scalp hematoma extending RIGHT periorbital. Remaining facial soft tissues unremarkable. CT CERVICAL SPINE FINDINGS Alignment: Mild anterolisthesis C3-C4. Minimal retrolisthesis C4-C5 and C5-C6. Remaining alignments normal Skull base and vertebrae: Osseous demineralization.  Multilevel facet degenerative changes. Disc space narrowing and endplate spur formation C4-C5 through C6-C7. Vertebral body heights maintained. Fractured spinous processes into her RIGHT lamina at C5 and at spinous process of C6. Uncovertebral spurs encroach upon the C5-C6 neural foramina bilaterally less at C4-C5 on LEFT. Soft tissues and spinal canal: Prevertebral soft tissues normal thickness. Atherosclerotic calcifications at the carotid bifurcations bilaterally. Remaining cervical soft tissues unremarkable. Disc levels: Significant endplate spur formation and uncovertebral spurring narrow the LEFT C5-C6 neural foramen and also flattening the LEFT ventral aspect of the thecal sac and spinal cord. Upper chest: Small RIGHT pleural effusion. Lung apices otherwise clear. Other: N/A IMPRESSION: Atrophy with small vessel chronic ischemic changes of deep cerebral white matter. No acute intracranial abnormalities. Fractured spinous process C6. Fractured spinous process C5 into RIGHT lamina of C5. Multilevel degenerative disc and facet disease changes of the cervical spine. Compression of the LEFT ventral aspect of the thecal sac and spinal cord at C5-C6 by endplate and uncovertebral spur formation. Minimally displaced distal RIGHT nasal bone fracture. No other facial bone fractures identified. Findings called to Dr.  Joni Fears on 09/26/2018 at 1342 hours. Electronically Signed   By: Lavonia Dana M.D.   On: 09/26/2018 13:42   Ct Hip Right Wo Contrast  Result Date: 09/26/2018 CLINICAL DATA:  Right hip pain since an unwitnessed fall this morning. EXAM: CT OF THE RIGHT HIP WITHOUT CONTRAST TECHNIQUE: Multidetector CT imaging of the right hip was performed according to the standard protocol. Multiplanar CT  image reconstructions were also generated. COMPARISON:  Radiographs dated 09/26/2018 FINDINGS: Bones/Joint/Cartilage There is no fracture or dislocation. Minimal marginal osteophytes on the right acetabulum. Slight  calcifications at the gluteal tendon insertions on the right greater trochanter. No effusion. Muscles and Tendons Normal. Soft tissues No acute abnormality.  Sigmoid diverticulosis. IMPRESSION: No acute abnormality of the right hip. Electronically Signed   By: Lorriane Shire M.D.   On: 09/26/2018 14:35   Dg Humerus Right  Result Date: 09/26/2018 CLINICAL DATA:  Right arm pain due to an unwitnessed fall at home this morning. EXAM: RIGHT HUMERUS - 2+ VIEW COMPARISON:  None. FINDINGS: There is no fracture or dislocation. There is slight chronic changes at the lateral epicondyle of the distal humerus as well as calcific tendinopathy at the rotator cuff of the right shoulder. Slight degenerative changes of the acromion with narrowing of the subacromial joint space consistent with rotator cuff disease. IMPRESSION: No acute abnormality of the right humerus. Electronically Signed   By: Lorriane Shire M.D.   On: 09/26/2018 13:51   Dg Hip Unilat W Or Wo Pelvis 2-3 Views Right  Result Date: 09/26/2018 CLINICAL DATA:  Right hip pain after a fall at home this morning. EXAM: DG HIP (WITH OR WITHOUT PELVIS) 2-3V RIGHT COMPARISON:  None. FINDINGS: There is no evidence of hip fracture or dislocation. There is no evidence of significant arthropathy or other focal bone abnormality of the right hip. Medial joint space narrowing of the left hip. IMPRESSION: No acute abnormalities.  Negative right hip. Electronically Signed   By: Lorriane Shire M.D.   On: 09/26/2018 13:55   Ct Maxillofacial Wo Contrast  Result Date: 09/26/2018 CLINICAL DATA:  Unwitnessed fall at home this morning at 0500 hours, laid on floor until EMS was called, fell onto face, unknown loss of consciousness, neck pain, history hypertension, atrial fibrillation, stage IV chronic kidney disease, CHF EXAM: CT HEAD WITHOUT CONTRAST CT MAXILLOFACIAL WITHOUT CONTRAST CT CERVICAL SPINE WITHOUT CONTRAST TECHNIQUE: Multidetector CT imaging of the head, cervical  spine, and maxillofacial structures were performed using the standard protocol without intravenous contrast. Multiplanar CT image reconstructions of the cervical spine and maxillofacial structures were also generated. Right side of face marked with BB. COMPARISON:  CT head 09/12/2018 CT cervical spine 09/11/2018 FINDINGS: CT HEAD FINDINGS Brain: Generalized atrophy. Normal ventricular morphology. No midline shift or mass effect. Small vessel chronic ischemic changes of deep cerebral white matter. No intracranial hemorrhage, mass lesion, evidence of acute infarction, or extra-axial fluid collection. Vascular: No hyperdense vessels. Atherosclerotic calcifications of internal carotid arteries at skull base. Skull: Frontal scalp hematoma.  Calvaria intact. Other: N/A CT MAXILLOFACIAL FINDINGS Osseous: Minimally displaced distal RIGHT nasal bone fracture. Scattered beam hardening artifacts of dental origin. TMJ alignment normal. Bony orbits and zygomas intact. No additional facial bone fractures identified. Orbits: Bony orbits intact. Intraorbital soft tissue planes clear bilaterally without fluid or pneumatosis Sinuses: Paranasal sinuses, mastoid air cells and middle ear cavities clear bilaterally Soft tissues: Frontal scalp hematoma extending RIGHT periorbital. Remaining facial soft tissues unremarkable. CT CERVICAL SPINE FINDINGS Alignment: Mild anterolisthesis C3-C4. Minimal retrolisthesis C4-C5 and C5-C6. Remaining alignments normal Skull base and vertebrae: Osseous demineralization. Multilevel facet degenerative changes. Disc space narrowing and endplate spur formation C4-C5 through C6-C7. Vertebral body heights maintained. Fractured spinous processes into her RIGHT lamina at C5 and at spinous process of C6. Uncovertebral spurs encroach upon the C5-C6 neural foramina bilaterally less at C4-C5 on LEFT. Soft tissues and spinal canal: Prevertebral soft tissues  normal thickness. Atherosclerotic calcifications at the  carotid bifurcations bilaterally. Remaining cervical soft tissues unremarkable. Disc levels: Significant endplate spur formation and uncovertebral spurring narrow the LEFT C5-C6 neural foramen and also flattening the LEFT ventral aspect of the thecal sac and spinal cord. Upper chest: Small RIGHT pleural effusion. Lung apices otherwise clear. Other: N/A IMPRESSION: Atrophy with small vessel chronic ischemic changes of deep cerebral white matter. No acute intracranial abnormalities. Fractured spinous process C6. Fractured spinous process C5 into RIGHT lamina of C5. Multilevel degenerative disc and facet disease changes of the cervical spine. Compression of the LEFT ventral aspect of the thecal sac and spinal cord at C5-C6 by endplate and uncovertebral spur formation. Minimally displaced distal RIGHT nasal bone fracture. No other facial bone fractures identified. Findings called to Dr.  Joni Fears on 09/26/2018 at 1342 hours. Electronically Signed   By: Lavonia Dana M.D.   On: 09/26/2018 13:42    Telemetry    Sinus brady/sinus rhythm. - Personally Reviewed  Cardiac Studies   2D Echocardiogram 1.17.2020  Study Conclusions   - Left ventricle: The cavity size was normal. There was mild   concentric hypertrophy. Systolic function was normal. The   estimated ejection fraction was in the range of 55% to 60%. Wall   motion was normal; there were no regional wall motion   abnormalities. The study is not technically sufficient to allow   evaluation of LV diastolic function. - Mitral valve: There was mild regurgitation. - Left atrium: The atrium was moderately dilated. - Right ventricle: Systolic function was normal. - Pulmonary arteries: Systolic pressure was within the normal   range.   Impressions:   - Rhythm is atrial fibrillation. _____________   Patient Profile     85 y.o. female with a history of paroxysmal atrial fibrillation, chronic diastolic congestive heart failure, stage IV chronic  kidney disease, asthma/COPD, hypertension, hypothyroidism, anemia of chronic disease, and Barrett's esophagus, who was admitted 2/19 following fall with finding of bradycardia.  Assessment & Plan    1.  Sinus bradycardia:  She presented following fall and prolonged time on the floor ~ 6 hrs.  HR's was in the 40's with soft BP's in the ED and she was placed on low dose dopamine to allow for pressure support and analgesics in the setting of shoulder pain.  HR/BP stable overnight.  Diltiazem (recently started in setting of PAF) has been discontinued - last dose on the AM of 2/18.  D/c dopamine today and ambulate if possible in order to assess for chronotropic competence.  2.  PAF:  S/p recent DCCV with initiation of dilt, which is now d/c'd in the setting of above.  She remains in sinus rhythm.  As above, wean dopamine and begin to assess for chronotropic competence as she is able.  We will need to reconsider our long term strategy for mgmt of her PAF.  She is quite symptomatic when in afib.  We may wish to start low dose amio following completion of dilt washout (tomorrow morning) in an effort at rhythm mgmt.  If she were to develop bradycardia on amio, she would likely require PPM for tachybrady syndrome.  Will discuss further with Dr. Saunders Revel.  She remains on dose-adjusted eliquis w/ stable H/H.  3.  Fall/Rhabdomyolysis: mechanical fall.  Lost her balance while reaching for her robe.  CK 861 on admission. S/p IVF.  4.  HFpEF:  Euvolemic. HR/BP stable.  5.  CKD IV:  Creat stable.  6.  C5-6 spinous process  fx:  Conservative rx per neurosurgery.  Signed, Murray Hodgkins, NP  09/27/2018, 12:08 PM    For questions or updates, please contact   Please consult [email or web address] for contact info under Cardiology/STEMI.

## 2018-09-27 NOTE — Plan of Care (Signed)
Pt cont on Dopamine gtt. Issues with vomiting and Nausea most of the night expect the last three hours. NP aware. Daughter was updated last at bedside. Complain of Gen ache and pain, prn given with some relief.

## 2018-09-28 ENCOUNTER — Ambulatory Visit: Payer: PPO | Admitting: Cardiovascular Disease

## 2018-09-28 LAB — BASIC METABOLIC PANEL
Anion gap: 9 (ref 5–15)
Anion gap: 6 (ref 5–15)
BUN: 38 mg/dL — ABNORMAL HIGH (ref 8–23)
BUN: 37 mg/dL — ABNORMAL HIGH (ref 8–23)
CALCIUM: 8 mg/dL — AB (ref 8.9–10.3)
CALCIUM: 7.9 mg/dL — AB (ref 8.9–10.3)
CO2: 23 mmol/L (ref 22–32)
CO2: 24 mmol/L (ref 22–32)
CREATININE: 1.65 mg/dL — AB (ref 0.44–1.00)
Chloride: 106 mmol/L (ref 98–111)
Chloride: 104 mmol/L (ref 98–111)
Creatinine, Ser: 1.66 mg/dL — ABNORMAL HIGH (ref 0.44–1.00)
GFR calc Af Amer: 32 mL/min — ABNORMAL LOW (ref >60)
GFR calc Af Amer: 32 mL/min — ABNORMAL LOW (ref >60)
GFR calc non Af Amer: 28 mL/min — ABNORMAL LOW (ref >60)
GFR calc non Af Amer: 27 mL/min — ABNORMAL LOW (ref >60)
Glucose, Bld: 99 mg/dL (ref 70–99)
Glucose, Bld: 95 mg/dL (ref 70–99)
Potassium: 3.4 mmol/L — ABNORMAL LOW (ref 3.5–5.1)
Potassium: 3.6 mmol/L (ref 3.5–5.1)
Sodium: 138 mmol/L (ref 135–145)
Sodium: 134 mmol/L — ABNORMAL LOW (ref 135–145)

## 2018-09-28 LAB — CBC
HCT: 27.6 % — ABNORMAL LOW (ref 36.0–46.0)
Hemoglobin: 8.1 g/dL — ABNORMAL LOW (ref 12.0–15.0)
MCH: 27.5 pg (ref 26.0–34.0)
MCHC: 29.3 g/dL — ABNORMAL LOW (ref 30.0–36.0)
MCV: 93.6 fL (ref 80.0–100.0)
Platelets: 203 10*3/uL (ref 150–400)
RBC: 2.95 MIL/uL — ABNORMAL LOW (ref 3.87–5.11)
RDW: 16.8 % — AB (ref 11.5–15.5)
WBC: 9.4 10*3/uL (ref 4.0–10.5)
nRBC: 0 % (ref 0.0–0.2)

## 2018-09-28 LAB — CK: Total CK: 343 U/L — ABNORMAL HIGH (ref 38–234)

## 2018-09-28 MED ORDER — POTASSIUM CHLORIDE CRYS ER 20 MEQ PO TBCR
40.0000 meq | EXTENDED_RELEASE_TABLET | Freq: Once | ORAL | Status: AC
  Administered 2018-09-28: 40 meq via ORAL
  Filled 2018-09-28: qty 2

## 2018-09-28 MED ORDER — LEVOTHYROXINE SODIUM 100 MCG PO TABS
100.0000 ug | ORAL_TABLET | Freq: Every day | ORAL | Status: DC
  Administered 2018-09-29 – 2018-10-03 (×5): 100 ug via ORAL
  Filled 2018-09-28 (×5): qty 1

## 2018-09-28 MED ORDER — PANTOPRAZOLE SODIUM 40 MG PO TBEC
40.0000 mg | DELAYED_RELEASE_TABLET | Freq: Every day | ORAL | Status: DC
  Administered 2018-09-28 – 2018-10-03 (×5): 40 mg via ORAL
  Filled 2018-09-28 (×4): qty 1

## 2018-09-28 MED ORDER — BUDESONIDE 3 MG PO CPEP
3.0000 mg | ORAL_CAPSULE | Freq: Every day | ORAL | Status: DC
  Administered 2018-09-28 – 2018-10-03 (×5): 3 mg via ORAL
  Filled 2018-09-28 (×6): qty 1

## 2018-09-28 MED ORDER — ESCITALOPRAM OXALATE 10 MG PO TABS
10.0000 mg | ORAL_TABLET | Freq: Every day | ORAL | Status: DC
  Administered 2018-09-28 – 2018-10-03 (×6): 10 mg via ORAL
  Filled 2018-09-28 (×6): qty 1

## 2018-09-28 NOTE — Progress Notes (Signed)
She is now off of dopamine with normal sinus rhythm and adequate blood pressure.  No new issues.  I have placed order for transfer to [facility] floor with cardiac monitoring.  After transfer, PCCM will sign off. Please call if we can be of further assistance    Merton Border, MD PCCM service Mobile [phone] Pager [phone] 09/28/2018 2:23 PM

## 2018-09-28 NOTE — Progress Notes (Signed)
[facility] called, Pt had unifocal PVCs. Dr Jannifer Franklin made aware. New order obtained.

## 2018-09-28 NOTE — Progress Notes (Signed)
[facility] [place] NAME: Savannah Wilson    MR#:  [id number]  DATE OF BIRTH:  1928/09/01  SUBJECTIVE:  CHIEF COMPLAINT:   Chief Complaint  Patient presents with  . Fall  Shoulder pain, bruised around eyeballs and tired, off pressors. No other complaints REVIEW OF SYSTEMS:  Review of Systems  Constitutional: Positive for malaise/fatigue. Negative for diaphoresis, fever and weight loss.  HENT: Negative for ear discharge, ear pain, hearing loss, nosebleeds, sore throat and tinnitus.   Eyes: Negative for blurred vision and pain.  Respiratory: Negative for cough, hemoptysis, shortness of breath and wheezing.   Cardiovascular: Negative for chest pain, palpitations, orthopnea and leg swelling.  Gastrointestinal: Negative for abdominal pain, blood in stool, constipation, diarrhea, heartburn, nausea and vomiting.  Genitourinary: Negative for dysuria, frequency and urgency.  Musculoskeletal: Positive for falls and joint pain. Negative for back pain and myalgias.  Skin: Negative for itching and rash.  Neurological: Negative for dizziness, tingling, tremors, focal weakness, seizures, weakness and headaches.  Psychiatric/Behavioral: Negative for depression. The patient is not nervous/anxious.    DRUG ALLERGIES:   Allergies  Allergen Reactions  . Promethazine Anxiety, Palpitations and Other (See Comments)    IV Promethazine 6.25 mg. JIttery, anxious, couldn't lay still, increase HR  . Ace Inhibitors Cough  . Benicar [Olmesartan Medoxomil]     Unsure of reaction type  . Clarithromycin Other (See Comments)    Blisters in mouth  . Clarithromycin Other (See Comments)    Unknown   . Norvasc [Amlodipine Besylate]   . Levofloxacin Rash  . Zithromax [Azithromycin Dihydrate] Rash   VITALS:  Blood pressure (!) 109/57, pulse 77, temperature 98.2 F (36.8 C), temperature source Oral, resp. rate (!) 21, height 5' (1.524 m), weight 62.2 kg, SpO2 94  %. PHYSICAL EXAMINATION:  Physical Exam HENT:     Head: Normocephalic and atraumatic.  Eyes:     Conjunctiva/sclera: Conjunctivae normal.     Pupils: Pupils are equal, round, and reactive to light.     Comments: Facial bruising and ecchymosis around eyes  Neck:     Musculoskeletal: Normal range of motion and neck supple.     Thyroid: No thyromegaly.     Trachea: No tracheal deviation.  Cardiovascular:     Rate and Rhythm: Normal rate and regular rhythm.     Heart sounds: Normal heart sounds.  Pulmonary:     Effort: Pulmonary effort is normal. No respiratory distress.     Breath sounds: Normal breath sounds. No wheezing.  Chest:     Chest wall: No tenderness.  Abdominal:     General: Bowel sounds are normal. There is no distension.     Palpations: Abdomen is soft.     Tenderness: There is no abdominal tenderness.  Musculoskeletal: Normal range of motion.  Skin:    General: Skin is warm and dry.     Findings: No rash.  Neurological:     Mental Status: She is alert and oriented to person, place, and time.     Cranial Nerves: No cranial nerve deficit.    LABORATORY PANEL:  Female CBC Recent Labs  Lab 09/28/18 0240  WBC 9.4  HGB 8.1*  HCT 27.6*  PLT 203   ------------------------------------------------------------------------------------------------------------------ Chemistries  Recent Labs  Lab 09/27/18 0513 09/28/18 0240  NA 137 138  K 3.8 3.4*  CL 105 106  CO2 23 23  GLUCOSE 113* 99  BUN 36* 38*  CREATININE 1.54* 1.65*  CALCIUM 8.4* 8.0*  AST 52*  --   ALT 28  --   ALKPHOS 71  --   BILITOT 2.2*  --    RADIOLOGY:  No results found. ASSESSMENT AND PLAN:  85 y.o. female with a history of paroxysmal atrial fibrillation, chronic diastolic congestive heart failure, stage IV chronic kidney disease, asthma/COPD, hypertension, hypothyroidism, anemia of chronic disease, and Barrett's esophagus, admitted s/p fall   1.  Sinus bradycardia, hypotension: off  pressors (dopamine). Off Diltiazem.  2.  PAF:  cardio following. on eliquis  3.  Rhabdomyolysis s/p mechanical fall: monitor CK - improving while on IVFs  4.  HFpEF:  Euvolemic. HR/BP stable. Off pressors  5.  CKD IV:  Creat stable.  6. Acute C5-6 spinous process fx:  Conservative mgmt per neurosurgery - okay to do PT assessment cervical collar as needed for comfort Pain management as needed  7. Chronic kidney disease stage IV - monitor and avoid nephrotoxins  8. Hypothyroidism continue Synthyroid   PT c/s and Palliative care c/s   All the records are reviewed and case discussed with Care Management/Social Worker. Management plans discussed with the patient, nursing and they are in agreement.  CODE STATUS: DNR  TOTAL TIME TAKING CARE OF THIS PATIENT: 15 minutes.   More than 50% of the time was spent in counseling/coordination of care: YES  POSSIBLE D/C IN 2-3 DAYS, DEPENDING ON CLINICAL CONDITION.   Max Sane M.D on 09/28/2018 at 3:06 PM  Between 7am to 6pm - Pager - [phone]  After 6pm go to [email or web address] - Proofreader  [facility]  Office  [phone]  CC: Primary care physician; [name], MD  Note: This dictation was prepared with Dragon dictation along with smaller phrase technology. Any transcriptional errors that result from this process are unintentional.

## 2018-09-28 NOTE — Progress Notes (Signed)
Progress Note  Patient Name: Savannah Wilson Date of Encounter: 09/28/2018  Primary Cardiologist: Fletcher Anon  Subjective   Continues to improve though with significant persistent right shoulder pain.  Daughter is at bedside.  She has been successfully weaned off dopamine infusion with heart rates currently in the mid to upper 50s to mid 60s bpm with occasional isolated PVCs.  No chest pain or shortness of breath.  Tolerating Eliquis.  Renal function slightly uptrending today from 1.5-1.6.  Inpatient Medications    Scheduled Meds: . apixaban  2.5 mg Oral BID  . budesonide  3 mg Oral Daily  . docusate sodium  100 mg Oral BID  . escitalopram  10 mg Oral Daily  . [START ON 09/29/2018] levothyroxine  100 mcg Oral Q0600  . pantoprazole  40 mg Oral Daily   Continuous Infusions:  PRN Meds: acetaminophen **OR** acetaminophen, bisacodyl, [DISCONTINUED] ondansetron **OR** ondansetron (ZOFRAN) IV, temazepam, traMADol   Vital Signs    Vitals:   09/28/18 1200 09/28/18 1300 09/28/18 1400 09/28/18 1500  BP: (!) 119/48 (!) 109/57 (!) 114/45 (!) 123/54  Pulse: (!) 59 77 (!) 54 61  Resp: 13 (!) 21 17 15   Temp: 98.2 F (36.8 C)     TempSrc: Oral     SpO2: 95% 94% 92% 94%  Weight:      Height:        Intake/Output Summary (Last 24 hours) at 09/28/2018 1529 Last data filed at 09/28/2018 1016 Gross per 24 hour  Intake 1155 ml  Output 550 ml  Net 605 ml   Filed Weights   09/26/18 1207 09/26/18 2246  Weight: 59.9 kg 62.2 kg    Telemetry    Sinus rhythm to sinus bradycardia with heart rates in the upper 50s to 60s bpm, occasional isolated PVCs- Personally Reviewed  ECG    n/a - Personally Reviewed  Physical Exam   GEN: No acute distress.  Significant facial bruising. Neck: No JVD. Cardiac: RRR, no murmurs, rubs, or gallops.  Respiratory: Clear to auscultation bilaterally.  GI: Soft, nontender, non-distended.   MS: No edema; No deformity.  Continued significant bilateral shoulder  pain tenderness, especially the right shoulder. Neuro:  Alert and oriented x 3; Nonfocal.  Psych: Normal affect.  Labs    Chemistry Recent Labs  Lab 09/26/18 1218 09/27/18 0513 09/28/18 0240  NA 136 137 138  K 3.7 3.8 3.4*  CL 104 105 106  CO2 22 23 23   GLUCOSE 127* 113* 99  BUN 35* 36* 38*  CREATININE 1.53* 1.54* 1.65*  CALCIUM 8.4* 8.4* 8.0*  PROT  --  5.9*  --   ALBUMIN  --  2.9*  --   AST  --  52*  --   ALT  --  28  --   ALKPHOS  --  71  --   BILITOT  --  2.2*  --   GFRNONAA 30* 30* 28*  GFRAA 35* 35* 32*  ANIONGAP 10 9 9      Hematology Recent Labs  Lab 09/26/18 1218 09/27/18 0513 09/28/18 0240  WBC 10.4 13.5* 9.4  RBC 3.45* 3.64* 2.95*  HGB 9.5* 10.1* 8.1*  HCT 32.0* 33.6* 27.6*  MCV 92.8 92.3 93.6  MCH 27.5 27.7 27.5  MCHC 29.7* 30.1 29.3*  RDW 17.1* 16.6* 16.8*  PLT 182 243 203    Cardiac Enzymes Recent Labs  Lab 09/26/18 1218 09/27/18 0513  TROPONINI 0.04* 0.05*   No results for input(s): TROPIPOC in the last 168  hours.   BNPNo results for input(s): BNP, PROBNP in the last 168 hours.   DDimer No results for input(s): DDIMER in the last 168 hours.   Radiology    Dg Abd 1 View  Result Date: 09/27/2018 IMPRESSION: Nonobstructive bowel gas pattern.  No abnormal stool retention. Electronically Signed   By: Monte Fantasia M.D.   On: 09/27/2018 04:22    Cardiac Studies   2D Echocardiogram 1.17.2020  Study Conclusions  - Left ventricle: The cavity size was normal. There was mild concentric hypertrophy. Systolic function was normal. The estimated ejection fraction was in the range of 55% to 60%. Wall motion was normal; there were no regional wall motion abnormalities. The study is not technically sufficient to allow evaluation of LV diastolic function. - Mitral valve: There was mild regurgitation. - Left atrium: The atrium was moderately dilated. - Right ventricle: Systolic function was normal. - Pulmonary arteries:  Systolic pressure was within the normal range.  Impressions:  - Rhythm is atrial fibrillation. _____________   Patient Profile     85 y.o. female with history of paroxysmal atrial fibrillation, chronic diastolic congestive heart failure, stage IV chronic kidney disease, asthma/COPD, hypertension, hypothyroidism, anemia of chronic disease, and Barrett's esophagus, who was admitted 2/19 following fall with finding of bradycardia.  Assessment & Plan    1. Sinus bradycardia:  She presented following fall and prolonged time on the floor ~ 6 hrs.  HR's was in the 40's with soft BP's in the ED and she was placed on low dose dopamine to allow for pressure support and analgesics in the setting of shoulder pain.  HR/BP stable overnight.  Diltiazem (recently started in setting of PAF) has been discontinued - last dose on the AM of 2/18.    Maintaining sinus rhythm with heart rates in the upper 50s to low 60s bpm off dopamine.  She is planning to work with PT given her fall with significant musculoskeletal pain as well as to assess for chronotropic competence.  TSH low at 0.148.  Recommend follow-up per internal medicine.  2.  PAF:  S/p recent DCCV with initiation of dilt, which is now d/c'd in the setting of above.  She remains in sinus rhythm.  As above, dopamine has been weaned with maintenance of stable heart rates.  As she tolerates, assess for chronotropic competence.  We will need to reconsider our long term strategy for mgmt of her PAF.  She is quite symptomatic when in afib.    For now, given she is asymptomatic and maintaining stable heart rates we will defer initiation of amiodarone given she has previously developed significant bradycardia on even low-dose beta-blocker/calcium channel blocker.  She will need continued monitoring to assess her heart rates with possible ultimate implantation of PPM for tachybradycardia syndrome.  She remains on dose-adjusted eliquis w/ stable H/H.  3.   Fall/Rhabdomyolysis: mechanical fall.  Lost her balance while reaching for her robe.  CK 861 on admission. S/p IVF.  4.  HFpEF:  Euvolemic. HR/BP stable.  5.  CKD IV:  Creat stable.  6.  C5-6 spinous process fx:  Conservative rx per neurosurgery.  For questions or updates, please contact [place] Please consult [email or web address] for contact info under Cardiology/STEMI.    Signed, Christell Faith, PA-C [facility] Pager: [phone] 09/28/2018, 3:29 PM

## 2018-09-29 LAB — BASIC METABOLIC PANEL
ANION GAP: 6 (ref 5–15)
BUN: 36 mg/dL — ABNORMAL HIGH (ref 8–23)
CO2: 24 mmol/L (ref 22–32)
Calcium: 8 mg/dL — ABNORMAL LOW (ref 8.9–10.3)
Chloride: 105 mmol/L (ref 98–111)
Creatinine, Ser: 1.56 mg/dL — ABNORMAL HIGH (ref 0.44–1.00)
GFR, EST AFRICAN AMERICAN: 34 mL/min — AB (ref >60)
GFR, EST NON AFRICAN AMERICAN: 30 mL/min — AB (ref >60)
Glucose, Bld: 90 mg/dL (ref 70–99)
Potassium: 3.6 mmol/L (ref 3.5–5.1)
Sodium: 135 mmol/L (ref 135–145)

## 2018-09-29 MED ORDER — FUROSEMIDE 10 MG/ML IJ SOLN
40.0000 mg | Freq: Once | INTRAMUSCULAR | Status: AC
  Administered 2018-09-29: 40 mg via INTRAVENOUS
  Filled 2018-09-29: qty 4

## 2018-09-29 MED ORDER — IPRATROPIUM-ALBUTEROL 0.5-2.5 (3) MG/3ML IN SOLN
3.0000 mL | Freq: Four times a day (QID) | RESPIRATORY_TRACT | Status: DC | PRN
  Administered 2018-09-29 – 2018-10-03 (×7): 3 mL via RESPIRATORY_TRACT
  Filled 2018-09-29 (×7): qty 3

## 2018-09-29 MED ORDER — ROPINIROLE HCL 0.25 MG PO TABS
0.2500 mg | ORAL_TABLET | Freq: Two times a day (BID) | ORAL | Status: DC
  Administered 2018-09-29 – 2018-10-03 (×8): 0.25 mg via ORAL
  Filled 2018-09-29 (×10): qty 1

## 2018-09-29 MED ORDER — FUROSEMIDE 40 MG PO TABS
40.0000 mg | ORAL_TABLET | Freq: Every day | ORAL | Status: DC
  Administered 2018-09-30 – 2018-10-03 (×4): 40 mg via ORAL
  Filled 2018-09-29 (×4): qty 1

## 2018-09-29 MED ORDER — POTASSIUM CHLORIDE CRYS ER 20 MEQ PO TBCR
40.0000 meq | EXTENDED_RELEASE_TABLET | Freq: Every day | ORAL | Status: DC
  Administered 2018-09-29: 40 meq via ORAL
  Filled 2018-09-29: qty 2

## 2018-09-29 NOTE — Progress Notes (Signed)
[facility] [place] NAME: Savannah Wilson    MR#:  [id number]  DATE OF BIRTH:  1928/10/12  SUBJECTIVE:  CHIEF COMPLAINT:   Chief Complaint  Patient presents with  . Fall   No new complaint this morning.  No chest pain.  No shortness of breath.  No fevers.  Patient recently transferred out of ICU.  REVIEW OF SYSTEMS:  Review of Systems  Constitutional: Negative for chills and fever.  HENT: Negative for hearing loss and tinnitus.   Eyes: Negative for blurred vision and double vision.  Respiratory: Negative for cough and hemoptysis.   Cardiovascular: Negative for chest pain and palpitations.  Gastrointestinal: Negative for abdominal pain, heartburn, nausea and vomiting.  Genitourinary: Negative for dysuria and urgency.  Musculoskeletal: Negative for myalgias and neck pain.  Skin: Negative for itching and rash.       Bruises on the face secondary to fall  Neurological: Negative for dizziness and headaches.  Psychiatric/Behavioral: Negative for depression and hallucinations.    DRUG ALLERGIES:   Allergies  Allergen Reactions  . Promethazine Anxiety, Palpitations and Other (See Comments)    IV Promethazine 6.25 mg. JIttery, anxious, couldn't lay still, increase HR  . Ace Inhibitors Cough  . Benicar [Olmesartan Medoxomil]     Unsure of reaction type  . Clarithromycin Other (See Comments)    Blisters in mouth  . Clarithromycin Other (See Comments)    Unknown   . Norvasc [Amlodipine Besylate]   . Levofloxacin Rash  . Zithromax [Azithromycin Dihydrate] Rash   VITALS:  Blood pressure 133/67, pulse (!) 57, temperature 97.8 F (36.6 C), temperature source Oral, resp. rate 18, height 5' (1.524 m), weight 62.2 kg, SpO2 98 %. PHYSICAL EXAMINATION:   HENT:     Head: Normocephalic and atraumatic.  Eyes:     Conjunctiva/sclera: Conjunctivae normal.     Pupils: Pupils are equal, round, and reactive to light.     Comments: Facial bruising  and ecchymosis around eyes  Neck:     Musculoskeletal: Normal range of motion and neck supple.     Thyroid: No thyromegaly.     Trachea: No tracheal deviation.  Cardiovascular:     Rate and Rhythm: Normal rate and regular rhythm.     Heart sounds: Normal heart sounds.  Pulmonary:     Effort: Pulmonary effort is normal. No respiratory distress.     Breath sounds: Normal breath sounds. No wheezing.  Chest:     Chest wall: No tenderness.  Abdominal:     General: Bowel sounds are normal. There is no distension.     Palpations: Abdomen is soft.     Tenderness: There is no abdominal tenderness.  Musculoskeletal: Normal range of motion.  Skin:    General: Skin is warm and dry.     Findings: No rash.  Neurological:     Mental Status: She is alert and oriented to person, place, and time.     Cranial Nerves: No cranial nerve deficit.   LABORATORY PANEL:  Female CBC Recent Labs  Lab 09/28/18 0240  WBC 9.4  HGB 8.1*  HCT 27.6*  PLT 203   ------------------------------------------------------------------------------------------------------------------ Chemistries  Recent Labs  Lab 09/27/18 0513  09/29/18 0537  NA 137   < > 135  K 3.8   < > 3.6  CL 105   < > 105  CO2 23   < > 24  GLUCOSE 113*   < > 90  BUN 36*   < >  36*  CREATININE 1.54*   < > 1.56*  CALCIUM 8.4*   < > 8.0*  AST 52*  --   --   ALT 28  --   --   ALKPHOS 71  --   --   BILITOT 2.2*  --   --    < > = values in this interval not displayed.   RADIOLOGY:  No results found. ASSESSMENT AND PLAN:     85 y.o.femalewith a history of paroxysmal atrial fibrillation, chronic diastolic congestive heart failure, stage IV chronic kidney disease, asthma/COPD, hypertension, hypothyroidism, anemia of chronic disease, and Barrett's esophagus, admitted s/p fall   1. Sinus bradycardia, hypotension: Resolved. off pressors (dopamine). Off Diltiazem.  2. PAF: cardio following. on eliquis Frequent ectopy likely recurrent  paroxysmal atrial fibrillation noted on telemetry over the last 24 hours.  Cardiologist following.  Avoiding adding antiarrhythmics at this time.  Patient will require outpatient EP follow-up and consideration for PPM in the setting of tachybradycardia syndrome.  Patient agreeable with plan.  Patient already on anticoagulation with Eliquis.  3. Rhabdomyolysis s/p mechanical fall: monitor CK - improving while on IVFs  4. HFpEF: Euvolemic. HR/BP stable. Off pressors  5. CKD IV: Creat stable.  6. AcuteC5-6 spinous process fx: Conservative mgmt per neurosurgery - okay to do PT assessment cervical collar as needed for comfort Pain management as needed  7. Chronic kidney disease stage IV - monitor and avoid nephrotoxins  8. Hypothyroidism continue Synthyroid  DVT prophylaxis; patient already on Eliquis.  Disposition; case manager working on discharge to rehab after the weekend.   All the records are reviewed and case discussed with Care Management/Social Worker. Management plans discussed with the patient, family and they are in agreement.  CODE STATUS: DNR  TOTAL TIME TAKING CARE OF THIS PATIENT: 27 minutes.   More than 50% of the time was spent in counseling/coordination of care: YES  POSSIBLE D/C IN 2 DAYS, DEPENDING ON CLINICAL CONDITION.   Juana Montini M.D on 09/29/2018 at 1:30 PM  Between 7am to 6pm - Pager - [phone]  After 6pm go to [email or web address] - Proofreader  [facility]  Office  [phone]  CC: Primary care physician; [name], MD  Note: This dictation was prepared with Dragon dictation along with smaller phrase technology. Any transcriptional errors that result from this process are unintentional.

## 2018-09-29 NOTE — Clinical Social Work Note (Addendum)
CSW attempted to meet with the patient and her daughter Celene Skeen in the room to discuss discharge planning with no success due to the daughter not being at bedside and the patient being somnolent. The CSW attempted to contact the patient's daughter and was unable to reach her; the CSW left a HIPPA compliant voicemail. The CSW will attempt again on 09/30/2018.  UPDATE: The patient's daughter returned the call. CSW will input assessment.  Santiago Bumpers, MSW, Latanya Presser [phone]

## 2018-09-29 NOTE — NC FL2 (Signed)
[place] LEVEL OF CARE SCREENING TOOL     IDENTIFICATION  Patient Name: Savannah Wilson Birthdate: 1928/08/27 Sex: female Admission Date (Current Location): 09/26/2018  [place] and [place] Number:  Engineering geologist and Address:  [facility], [address] [place], [place] [address]      Provider Number: [id number]  Attending Physician Name and Address:  Otila Back, MD  Relative Name and Phone Number:  Knute Neu (Daughter) [phone]    Current Level of Care: Hospital Recommended Level of Care: [place] Prior Approval Number:    Date Approved/Denied:   PASRR Number: [id number]  Discharge Plan: SNF    Current Diagnoses: Patient Active Problem List   Diagnosis Date Noted  . Persistent atrial fibrillation   . Chronic heart failure with preserved ejection fraction (HFpEF) ([place])   . Rhabdomyolysis 09/26/2018  . Fall 09/26/2018  . Nausea vomiting and diarrhea 09/13/2018  . Insomnia due to anxiety and fear 05/03/2018  . Pneumonia 08/14/2017  . Hospital discharge follow-up 05/06/2017  . CHF exacerbation ([place]) 04/15/2017  . Atrial fibrillation with RVR ([place]) 01/25/2017  . Grief at loss of child 01/25/2017  . Atherosclerosis of aorta ([place]) 07/26/2016  . Angioedema 12/06/2015  . Routine general medical examination at a health care facility 07/25/2015  . Bilateral hand numbness 01/29/2015  . Urinary incontinence, mixed 01/29/2015  . Counseling regarding advanced directives and goals of care 01/29/2015  . Medicare annual wellness visit, subsequent 08/09/2014  . Hypothyroidism 10/06/2013  . Hiatal hernia with gastroesophageal reflux 09/24/2013  . Zenker's diverticulum 09/24/2013  . Anxiety state 07/05/2013  . Abdominal pain 06/03/2013  . Need for prophylactic vaccination and inoculation against influenza 05/07/2013  . Allergic rhinitis 01/11/2013  . Dyspnea on exertion 01/11/2013  . Bradycardia, drug  induced 08/26/2012  . Renal cysts, acquired, bilateral 08/26/2012  . Anemia in chronic kidney disease (CKD) 06/24/2012  . Neuropathy 05/22/2012  . History of pancreatitis   . Hypertension 09/15/2011  . Restless legs syndrome (RLS) 09/15/2011  . Asthma   . Depression, major, single episode, moderate ([place])   . Venous insufficiency   . Barrett esophagus   . CKD (chronic kidney disease), stage IV (H[facility])     Orientation RESPIRATION BLADDER Height & Weight     Self, Time, Situation, Place  O2(acute, 2L o2) Continent Weight: 137 lb 2 oz (62.2 kg) Height:  5' (152.4 cm)  BEHAVIORAL SYMPTOMS/MOOD NEUROLOGICAL BOWEL NUTRITION STATUS      Continent Diet(Regular)  AMBULATORY STATUS COMMUNICATION OF NEEDS Skin   Extensive Assist Verbally Normal                       Personal Care Assistance Level of Assistance  Bathing, Feeding, Dressing Bathing Assistance: Limited assistance Feeding assistance: Independent Dressing Assistance: Limited assistance     Functional Limitations Info  Sight, Hearing, Speech Sight Info: Adequate Hearing Info: Adequate Speech Info: Adequate    SPECIAL CARE FACTORS FREQUENCY  PT (By licensed PT), OT (By licensed OT)     PT Frequency: 5X OT Frequency: 3X            Contractures Contractures Info: Not present    Additional Factors Info  Code Status, Allergies Code Status Info: DNR Allergies Info: Promethazine, Ace Inhibitors, Benicar Olmesartan Medoxomil, Clarithromycin, Clarithromycin, Norvasc Amlodipine Besylate, Levofloxacin, Zithromax Azithromycin Dihydrate           Current Medications (09/29/2018):  This is the current hospital active medication  list Current Facility-Administered Medications  Medication Dose Route Frequency Provider Last Rate Last Dose  . acetaminophen (TYLENOL) tablet 650 mg  650 mg Oral Q6H PRN Gouru, Aruna, MD   650 mg at 09/28/18 5638   Or  . acetaminophen (TYLENOL) suppository 650 mg  650 mg Rectal Q6H PRN  Gouru, Aruna, MD      . apixaban (ELIQUIS) tablet 2.5 mg  2.5 mg Oral BID Hallaji, Sheema M, RPH   2.5 mg at 09/29/18 0859  . bisacodyl (DULCOLAX) EC tablet 5 mg  5 mg Oral Daily PRN Gouru, Aruna, MD      . budesonide (ENTOCORT EC) 24 hr capsule 3 mg  3 mg Oral Daily Max Sane, MD   3 mg at 09/29/18 0859  . docusate sodium (COLACE) capsule 100 mg  100 mg Oral BID Gouru, Aruna, MD   100 mg at 09/28/18 2037  . escitalopram (LEXAPRO) tablet 10 mg  10 mg Oral Daily Max Sane, MD   10 mg at 09/29/18 0859  . [START ON 09/30/2018] furosemide (LASIX) tablet 40 mg  40 mg Oral Daily Theora Gianotti, NP      . levothyroxine (SYNTHROID, LEVOTHROID) tablet 100 mcg  100 mcg Oral Q0600 Max Sane, MD   100 mcg at 09/29/18 0517  . ondansetron (ZOFRAN) injection 4 mg  4 mg Intravenous Q6H PRN Gouru, Aruna, MD   4 mg at 09/27/18 0917  . pantoprazole (PROTONIX) EC tablet 40 mg  40 mg Oral Daily Max Sane, MD   40 mg at 09/29/18 0859  . potassium chloride SA (K-DUR,KLOR-CON) CR tablet 40 mEq  40 mEq Oral Daily Theora Gianotti, NP   40 mEq at 09/29/18 1326  . rOPINIRole (REQUIP) tablet 0.25 mg  0.25 mg Oral BID Ojie, Jude, MD      . temazepam (RESTORIL) capsule 15 mg  15 mg Oral QHS PRN Bradly Bienenstock, NP   15 mg at 09/27/18 2245  . traMADol (ULTRAM) tablet 50 mg  50 mg Oral Q6H PRN Gouru, Aruna, MD   50 mg at 09/28/18 2312     Discharge Medications: Please see discharge summary for a list of discharge medications.  Relevant Imaging Results:  Relevant Lab Results:   Additional Information [phone]  Zettie Pho, LCSW

## 2018-09-29 NOTE — Progress Notes (Addendum)
Physical Therapy Treatment Patient Details Name: Savannah Wilson MRN: [id number] DOB: 03-Aug-1929 Today's Date: 09/29/2018    History of Present Illness Pt is an 85 year old female admitted with rhabdomyolysis and bradycardia following a fall in her home.  Pt was down in her floor for 5 hrs before she was found and has been found to have C5/6 fracture.  PMH includes CKD, depression, atrial fibrillation, Htn, HLD.    PT Comments    Pt agreeable to PT. Pt reports weakness throughout LEs and B hands. Pt requires Mod to Max A for bed mobility to edge of bed. Static balance requires Min guard to Min A. Pt/family educated in exercises with written exercise program provided. Continue PT to progress strength, endurance to improve functional mobility.   Follow Up Recommendations  SNF     Equipment Recommendations  Rolling walker with 5" wheels    Recommendations for Other Services       Precautions / Restrictions Precautions Precautions: Fall Restrictions Weight Bearing Restrictions: No    Mobility  Bed Mobility Overal bed mobility: Needs Assistance Bed Mobility: Supine to Sit;Sit to Supine     Supine to sit: Mod assist Sit to supine: Max assist   General bed mobility comments: Sequence assist and physical assist LEs and trunk. Min guard to Min A to balance seated edge of bed  Transfers                 General transfer comment: Unsafe at this time  Ambulation/Gait                 Stairs             Wheelchair Mobility    Modified Rankin (Stroke Patients Only)       Balance                                            Cognition Arousal/Alertness: Awake/alert Behavior During Therapy: WFL for tasks assessed/performed Overall Cognitive Status: Within Functional Limits for tasks assessed                                 General Comments: Pt weak, feels dizzy with sitting edge of bed (BP 131/63      Exercises General  Exercises - Lower Extremity Ankle Circles/Pumps: AROM;Both;10 reps;Supine;Other (comment)(attempted seated/difficult) Quad Sets: Strengthening;Both;10 reps;Supine Gluteal Sets: Strengthening;Both;10 reps;Supine Short Arc Quad: AAROM;AROM;Both;10 reps;Supine Heel Slides: AAROM;Both;10 reps;Supine;Other (comment)(assist on LLE for ext control) Hip ABduction/ADduction: AAROM;Both;10 reps;Supine Other Exercises Other Exercises: Education provided to family as well for assisting pt Other Exercises: Pt family given written supine exercises Other Exercises: Sitting edge of bed    General Comments        Pertinent Vitals/Pain Pain Assessment: Faces Faces Pain Scale: Hurts little more Pain Location: B hands/UE Pain Descriptors / Indicators: Constant;Aching;Discomfort;Tender;Other (Comment)(limits use) Pain Intervention(s): Limited activity within patient's tolerance;Monitored during session    Home Living                      Prior Function            PT Goals (current goals can now be found in the care plan section) Progress towards PT goals: Progressing toward goals    Frequency    Min 2X/week  PT Plan Current plan remains appropriate    Co-evaluation              AM-PAC PT "6 Clicks" Mobility   Outcome Measure  Help needed turning from your back to your side while in a flat bed without using bedrails?: A Lot Help needed moving from lying on your back to sitting on the side of a flat bed without using bedrails?: A Lot Help needed moving to and from a bed to a chair (including a wheelchair)?: Total Help needed standing up from a chair using your arms (e.g., wheelchair or bedside chair)?: Total Help needed to walk in hospital room?: Total Help needed climbing 3-5 steps with a railing? : Total 6 Click Score: 8    End of Session   Activity Tolerance: Other (comment)(weakness/dizziness) Patient left: in bed;with call bell/phone within reach;with bed  alarm set;with family/visitor present   PT Visit Diagnosis: Unsteadiness on feet (R26.81);Muscle weakness (generalized) (M62.81)     Time: 1610-9604 PT Time Calculation (min) (ACUTE ONLY): 34 min  Charges:  $Therapeutic Exercise: 8-22 mins $Therapeutic Activity: 8-22 mins                      Larae Grooms, PTA 09/29/2018, 2:01 PM

## 2018-09-29 NOTE — Clinical Social Work Note (Signed)
Clinical Social Work Assessment  Patient Details  Name: Savannah Wilson MRN: [id number] Date of Birth: 1928-11-18  Date of referral:  09/29/18               Reason for consult:  Facility Placement                Permission sought to share information with:  Chartered certified accountant granted to share information::  Yes, Verbal Permission Granted  Name::        Agency::  [facility]  Relationship::     Contact Information:     Housing/Transportation Living arrangements for the past 2 months:  [place] of Information:  Patient, Medical Team, Adult Children Patient Interpreter Needed:  None Criminal Activity/Legal Involvement Pertinent to Current Situation/Hospitalization:  No - Comment as needed Significant Relationships:  Adult Children Lives with:  Self Do you feel safe going back to the place where you live?  Yes Need for family participation in patient care:  No (Coment)  Care giving concerns:  PT recommendation for SNF   Social Worker assessment / plan:  The CSW spoke with the patient's daughter by phone to discuss discharge planning. Per Tammy, she and the patient discussed alternative discharge rather than rehab (such as having 24 hour care in the home); however, the patient reported to her daughter that she would rather "go to rehab then have help when I can at least get up out of bed and move my arms." The CSW explained the referral process including prior authorization through [facility] [employer]. Tammy shared that the preferences are [facility], [facility], or [facility].   The CSW has begun the referral process. The CSW will initiate insurance authorization on 09/30/2018 for possible discharge on Monday. Tammy is aware of possible Monday discharge. CSW is following.  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  Patient/Family's Understanding of and Emotional Response to Diagnosis, Current Treatment, and Prognosis:  The patient and her family understand the PT recommendation and are in agreement.  Emotional Assessment Appearance:  Appears stated age Attitude/Demeanor/Rapport:  Lethargic Affect (typically observed):  Stable Orientation:  Oriented to Self, Oriented to Place, Oriented to  Time, Oriented to Situation Alcohol / Substance use:  Never Used Psych involvement (Current and /or in the community):  No (Comment)  Discharge Needs  Concerns to be addressed:  Discharge Planning Concerns, Care Coordination Readmission within the last 30 days:  Yes Current discharge risk:  Lives alone, Physical Impairment Barriers to Discharge:  Ship broker, Continued Medical Work up   [employer], LCSW 09/29/2018, 4:20 PM

## 2018-09-29 NOTE — Progress Notes (Addendum)
Progress Note  Patient Name: Savannah Wilson Date of Encounter: 09/29/2018  Primary Cardiologist: Kathlyn Sacramento, MD  Subjective   Still with bilat shoulder pain.  Feels weak.  Became SOB just sitting up @ bedside with PT.  Inpatient Medications    Scheduled Meds: . apixaban  2.5 mg Oral BID  . budesonide  3 mg Oral Daily  . docusate sodium  100 mg Oral BID  . escitalopram  10 mg Oral Daily  . levothyroxine  100 mcg Oral Q0600  . pantoprazole  40 mg Oral Daily   Continuous Infusions:  PRN Meds: acetaminophen **OR** acetaminophen, bisacodyl, [DISCONTINUED] ondansetron **OR** ondansetron (ZOFRAN) IV, temazepam, traMADol   Vital Signs    Vitals:   09/28/18 1500 09/28/18 1636 09/28/18 2334 09/29/18 0821  BP: (!) 123/54 (!) 136/51 (!) 136/53 133/67  Pulse: 61 69 66 (!) 57  Resp: 15 20 18    Temp:  (!) 97.5 F (36.4 C) 97.8 F (36.6 C) 97.8 F (36.6 C)  TempSrc:  Oral Oral Oral  SpO2: 94% 97% 94% 98%  Weight:      Height:        Intake/Output Summary (Last 24 hours) at 09/29/2018 1213 Last data filed at 09/29/2018 0954 Gross per 24 hour  Intake 420 ml  Output 300 ml  Net 120 ml   Filed Weights   09/26/18 1207 09/26/18 2246  Weight: 59.9 kg 62.2 kg    Physical Exam   GEN: Well nourished, well developed, in no acute distress.  HEENT: persistent facial bruising. Neck: Supple, difficult to gauge JVP 2/2 body habitus, no carotid bruits, or masses. Cardiac: RRR, distant, no murmurs, rubs, or gallops. No clubbing, cyanosis, edema.  Radials/DP/PT 2+ and equal bilaterally.  Respiratory:  Respirations regular and unlabored, diminished breath sounds bilaterally - poor effort. GI: Soft, nontender, nondistended, BS + x 4. MS: no deformity or atrophy. Skin: warm and dry, no rash. Neuro:  Strength and sensation are intact. Psych: AAOx3.  Normal affect.  Labs    Chemistry Recent Labs  Lab 09/27/18 0513 09/28/18 0240 09/28/18 2307 09/29/18 0537  NA 137 138 134*  135  K 3.8 3.4* 3.6 3.6  CL 105 106 104 105  CO2 23 23 24 24   GLUCOSE 113* 99 95 90  BUN 36* 38* 37* 36*  CREATININE 1.54* 1.65* 1.66* 1.56*  CALCIUM 8.4* 8.0* 7.9* 8.0*  PROT 5.9*  --   --   --   ALBUMIN 2.9*  --   --   --   AST 52*  --   --   --   ALT 28  --   --   --   ALKPHOS 71  --   --   --   BILITOT 2.2*  --   --   --   GFRNONAA 30* 28* 27* 30*  GFRAA 35* 32* 32* 34*  ANIONGAP 9 9 6 6      Hematology Recent Labs  Lab 09/26/18 1218 09/27/18 0513 09/28/18 0240  WBC 10.4 13.5* 9.4  RBC 3.45* 3.64* 2.95*  HGB 9.5* 10.1* 8.1*  HCT 32.0* 33.6* 27.6*  MCV 92.8 92.3 93.6  MCH 27.5 27.7 27.5  MCHC 29.7* 30.1 29.3*  RDW 17.1* 16.6* 16.8*  PLT 182 243 203    Cardiac Enzymes Recent Labs  Lab 09/26/18 1218 09/27/18 0513  TROPONINI 0.04* 0.05*      Radiology    No results found.  Telemetry    Significant baseline artifact - at times  it appears she has had PAF.  Otw sinus this AM w/ PACs, freq PVCs, and brief runs of AIVR vs atrial tach w/ aberrant conduction. - Personally Reviewed  Cardiac Studies   2D Echocardiogram1.17.2020  Study Conclusions  - Left ventricle: The cavity size was normal. There was mild concentric hypertrophy. Systolic function was normal. The estimated ejection fraction was in the range of 55% to 60%. Wall motion was normal; there were no regional wall motion abnormalities. The study is not technically sufficient to allow evaluation of LV diastolic function. - Mitral valve: There was mild regurgitation. - Left atrium: The atrium was moderately dilated. - Right ventricle: Systolic function was normal. - Pulmonary arteries: Systolic pressure was within the normal range.  Impressions:  - Rhythm is atrial fibrillation. _____________  Patient Profile     85 y.o. female with history of paroxysmal atrial fibrillation, chronic diastolic congestive heart failure, stage IV chronic kidney disease, asthma/COPD,  hypertension, hypothyroidism, anemia of chronic disease, and Barrett's esophagus, who was admitted 2/19 following fall with finding of bradycardia.  Assessment & Plan    1.  Sinus bradycardia:  She presented following fall and prolonged time on the floor ~ 6 hrs. HR's was in the 40's with soft BP's in the ED and she was placed on low dose dopamine to allow for pressure support and analgesics in the setting of shoulder pain. Dopamine d/c'd 2/20.Diltiazem (recently started in setting of PAF) has been discontinued - last dose on the AM of 2/18. HR's stable - 50's to 70's.  2.  PAF:  S/p DCCV w/ initiation of dilt recently.  As above, dilt d/c'd in setting of bradycardia.  Freq atrial ectopy and likely recurrent PAF over past 24 hrs.  Baseline artifact makes distinguishing rhythm somewhat difficult, though she is clearly in sinus this AM.  We remain concerned re: prior significant brady w/  blocker and low dose dilt and thus will avoid adding antiarrhythmic therapy @ this time.  She notes significant fatigue when in afib and does not like the prospect of recurrent afib off of AVN blocking agents.  She will require outpt EP f/u and consideration for PPM in setting of tachybrady syndrome.  she is agreeable with this plan.  Cont eliquis.  3. Fall/Rhabdo: CKD 861 on admission in setting of prolonged time on floor.  This has trended down since.  4.  HFpEF: Nl EF by echo in Jan. +1.6L in setting of IVF/rhabdo this admission.  Dysnpeic with minimal activity in bed this AM.  HR/BP stable.  She is on lasix 40 daily @ home, but this has been held in the setting of bradycardia and hypotension earlier in admission.  I will give 1 dose of lasix 40 IV now and then resume 40 po daily tomorrow.  Supp K.  5.  CKD IV: renal fxn stable. Follow with resumption of diuretic therapy.  6. C5-6 spinous process fx: conservative rx per [facility].  Signed, Murray Hodgkins, NP  09/29/2018, 12:13 PM    For questions or updates,  please contact   Please consult [email or web address] for contact info under Cardiology/STEMI.   Attending Note:   The patient was seen and examined.  Agree with assessment and plan as noted above.  Changes made to the above note as needed.  Patient seen and independently examined with Ignacia Bayley, NP .   We discussed all aspects of the encounter. I agree with the assessment and plan as stated above.  1.   Sinus bradycardia: The  patient was admitted to the hospital following a fall and prolonged time on the floor.  Her heart rate was found to be in the 40s.  She was on diltiazem in the setting of paroxysmal atrial fibrillation.  The diltiazem has been stopped. Now continues to have a slow/low normal blood pressure.  She has had occasional episodes of accelerated junctional rhythm. At this point I would continue to hold beta-blockers and diltiazem.  I would not start amiodarone because I think she is likely to have additional episodes of sinus bradycardia. Sinus bradycardia and subsequent weakness may have contributed to her falling episode.  She will need to see electrophysiology in the office.    We need to consider permanent pacing in her .     Initiation of any beta block or CCB or amiodarone would likely require the presence of a pacer for back up .    2.  Paroxysmal atrial fibrillation: Continue Eliquis.  She has lots of bruising but no intra-cranial bleeding.  I suspect that she will need to be considered for pacemaker implantation.  It will be very difficult to start her on a beta-blocker, calcium blocker, or amiodarone with his history of bradycardia.   I have spent a total of 40 minutes with patient reviewing hospital  notes , telemetry, EKGs, labs and examining patient as well as establishing an assessment and plan that was discussed with the patient. > 50% of time was spent in direct patient care.     Thayer Headings, Brooke Bonito., MD, [facility] 09/29/2018, 1:38 PM [address],  [place] Pager [phone]

## 2018-09-30 ENCOUNTER — Inpatient Hospital Stay: Payer: PPO

## 2018-09-30 DIAGNOSIS — I10 Essential (primary) hypertension: Secondary | ICD-10-CM

## 2018-09-30 LAB — BASIC METABOLIC PANEL
Anion gap: 4 — ABNORMAL LOW (ref 5–15)
BUN: 35 mg/dL — ABNORMAL HIGH (ref 8–23)
CO2: 26 mmol/L (ref 22–32)
Calcium: 8.1 mg/dL — ABNORMAL LOW (ref 8.9–10.3)
Chloride: 106 mmol/L (ref 98–111)
Creatinine, Ser: 1.46 mg/dL — ABNORMAL HIGH (ref 0.44–1.00)
GFR calc Af Amer: 37 mL/min — ABNORMAL LOW (ref >60)
GFR calc non Af Amer: 32 mL/min — ABNORMAL LOW (ref >60)
Glucose, Bld: 98 mg/dL (ref 70–99)
Potassium: 3.9 mmol/L (ref 3.5–5.1)
SODIUM: 136 mmol/L (ref 135–145)

## 2018-09-30 LAB — MAGNESIUM: Magnesium: 1.6 mg/dL — ABNORMAL LOW (ref 1.7–2.4)

## 2018-09-30 MED ORDER — POTASSIUM CHLORIDE CRYS ER 10 MEQ PO TBCR
10.0000 meq | EXTENDED_RELEASE_TABLET | Freq: Every day | ORAL | Status: DC
  Administered 2018-10-01 – 2018-10-03 (×3): 10 meq via ORAL
  Filled 2018-09-30 (×3): qty 1

## 2018-09-30 MED ORDER — MAGNESIUM SULFATE IN D5W 1-5 GM/100ML-% IV SOLN
1.0000 g | Freq: Once | INTRAVENOUS | Status: AC
  Administered 2018-09-30: 1 g via INTRAVENOUS
  Filled 2018-09-30: qty 100

## 2018-09-30 MED ORDER — LOSARTAN POTASSIUM 50 MG PO TABS
50.0000 mg | ORAL_TABLET | Freq: Every day | ORAL | Status: DC
  Administered 2018-09-30 – 2018-10-03 (×4): 50 mg via ORAL
  Filled 2018-09-30 (×4): qty 1

## 2018-09-30 NOTE — Progress Notes (Signed)
[place] [facility] [place] NAME: Savannah Wilson    MR#:  [id number]  DATE OF BIRTH:  24-Nov-1928  SUBJECTIVE:  CHIEF COMPLAINT:   Chief Complaint  Patient presents with  . Fall   This morning patient noted to be having some intermittent coughing with daughter assisting patient with breakfast.  Speech consult placed. Daughter also reported noticing some right lower extremity weakness since the recent fall.  Initial CT head was negative.  Repeat CT head done this morning with no evidence of acute CVA.  Physical therapy already working with patient with plans for discharge to rehab after the weekend.  REVIEW OF SYSTEMS:  Review of Systems  Constitutional: Negative for chills and fever.  HENT: Negative for hearing loss and tinnitus.   Eyes: Negative for blurred vision and double vision.  Respiratory: Negative for cough and hemoptysis.   Cardiovascular: Negative for chest pain and palpitations.  Gastrointestinal: Negative for abdominal pain, heartburn, nausea and vomiting.  Genitourinary: Negative for dysuria and urgency.  Musculoskeletal: Negative for myalgias and neck pain.  Skin: Negative for itching and rash.       Bruises on the face secondary to fall  Neurological: Negative for dizziness and headaches.  Psychiatric/Behavioral: Negative for depression and hallucinations.    DRUG ALLERGIES:   Allergies  Allergen Reactions  . Promethazine Anxiety, Palpitations and Other (See Comments)    IV Promethazine 6.25 mg. JIttery, anxious, couldn't lay still, increase HR  . Ace Inhibitors Cough  . Benicar [Olmesartan Medoxomil]     Unsure of reaction type  . Clarithromycin Other (See Comments)    Blisters in mouth  . Clarithromycin Other (See Comments)    Unknown   . Norvasc [Amlodipine Besylate]   . Levofloxacin Rash  . Zithromax [Azithromycin Dihydrate] Rash   VITALS:  Blood pressure (!) 150/84, pulse 74, temperature 97.9 F (36.6 C),  temperature source Oral, resp. rate 16, height 5' (1.524 m), weight 62.2 kg, SpO2 97 %. PHYSICAL EXAMINATION:   HENT:     Head: Normocephalic and atraumatic.  Eyes:     Conjunctiva/sclera: Conjunctivae normal.     Pupils: Pupils are equal, round, and reactive to light.     Comments: Facial bruising and ecchymosis around eyes  Neck:     Musculoskeletal: Normal range of motion and neck supple.     Thyroid: No thyromegaly.     Trachea: No tracheal deviation.  Cardiovascular:     Rate and Rhythm: Normal rate and regular rhythm.     Heart sounds: Normal heart sounds.  Pulmonary:     Effort: Pulmonary effort is normal. No respiratory distress.     Breath sounds: Normal breath sounds. No wheezing.  Chest:     Chest wall: No tenderness.  Abdominal:     General: Bowel sounds are normal. There is no distension.     Palpations: Abdomen is soft.     Tenderness: There is no abdominal tenderness.  Musculoskeletal: Normal range of motion.  Skin:    General: Skin is warm and dry.     Findings: No rash.  Neurological:     Mental Status: She is alert and oriented to person, place, and time.    Generalized weakness more on the right side.  LABORATORY PANEL:  Female CBC Recent Labs  Lab 09/28/18 0240  WBC 9.4  HGB 8.1*  HCT 27.6*  PLT 203   ------------------------------------------------------------------------------------------------------------------ Chemistries  Recent Labs  Lab 09/27/18 0513  09/30/18 0600  NA 137   < > 136  K 3.8   < > 3.9  CL 105   < > 106  CO2 23   < > 26  GLUCOSE 113*   < > 98  BUN 36*   < > 35*  CREATININE 1.54*   < > 1.46*  CALCIUM 8.4*   < > 8.1*  MG  --   --  1.6*  AST 52*  --   --   ALT 28  --   --   ALKPHOS 71  --   --   BILITOT 2.2*  --   --    < > = values in this interval not displayed.   RADIOLOGY:  Ct Head Wo Contrast  Result Date: 09/30/2018 CLINICAL DATA:  Right side weakness today. The patient suffered a fall a few days ago.  Initial encounter. EXAM: CT HEAD WITHOUT CONTRAST TECHNIQUE: Contiguous axial images were obtained from the base of the skull through the vertex without intravenous contrast. COMPARISON:  Head CT 09/26/2018 and 09/11/2018. FINDINGS: Brain: No evidence of acute infarction, hemorrhage, hydrocephalus, extra-axial collection or mass lesion/mass effect. Mild atrophy and chronic microvascular ischemic change noted. Vascular: No hyperdense vessel or unexpected calcification. Skull: Intact.  No focal lesion. Sinuses/Orbits: Negative. Other: None. IMPRESSION: No acute abnormality. Mild atrophy and chronic microvascular ischemic change. Electronically Signed   By: Inge Rise M.D.   On: 09/30/2018 11:25   ASSESSMENT AND PLAN:     85 y.o.femalewith a history of paroxysmal atrial fibrillation, chronic diastolic congestive heart failure, stage IV chronic kidney disease, asthma/COPD, hypertension, hypothyroidism, anemia of chronic disease, and Barrett's esophagus, admitted s/p fall   1. Sinus bradycardia, hypotension: Resolved. off pressors (dopamine). Off Diltiazem.  2. PAF: cardio following. Frequent ectopy likely recurrent paroxysmal atrial fibrillation noted on telemetry over the last 24 hours.  Cardiologist following.  Avoiding adding antiarrhythmics at this time.  Patient will require outpatient EP follow-up and consideration for PPM in the setting of tachybradycardia syndrome.  Patient agreeable with plan.  Patient already on anticoagulation with Eliquis.  3. Rhabdomyolysis s/p mechanical fall: monitor CK - improved with IVFs  4. HFpEF: Euvolemic. HR/BP stable. Off pressors  5. CKD IV: Creat stable.  6. AcuteC5-6 spinous process fx: Conservative mgmt per neurosurgery Physical therapy working with patient.  Plans for rehab placement on discharge after the weekendPain management as needed  7. Hypothyroidism continue Synthyroid  8.  Hypomagnesemia; replace.  Follow-up on repeat  levels in a.m.  DVT prophylaxis; patient already on Eliquis.  Disposition; case manager working on discharge to rehab after the weekend. Updated daughter and grandson present at bedside.   All the records are reviewed and case discussed with Care Management/Social Worker. Management plans discussed with the patient, family and they are in agreement.  CODE STATUS: DNR  TOTAL TIME TAKING CARE OF THIS PATIENT: 27 minutes.   More than 50% of the time was spent in counseling/coordination of care: YES  POSSIBLE D/C IN 2 DAYS, DEPENDING ON CLINICAL CONDITION.   Zetta Stoneman M.D on 09/30/2018 at 12:27 PM  Between 7am to 6pm - Pager - [phone]  After 6pm go to [email or web address] - Proofreader  [facility]  Office  [phone]  CC: Primary care physician; [name], MD  Note: This dictation was prepared with Dragon dictation along with smaller phrase technology. Any transcriptional errors that result from this process are unintentional.

## 2018-09-30 NOTE — Progress Notes (Signed)
Progress Note  Patient Name: Savannah Wilson Date of Encounter: 09/30/2018  Primary Cardiologist: Kathlyn Sacramento, MD  Subjective   Still with bilat shoulder pain.  Feels weak.  Became SOB just sitting up @ bedside with PT.  Inpatient Medications    Scheduled Meds: . apixaban  2.5 mg Oral BID  . budesonide  3 mg Oral Daily  . docusate sodium  100 mg Oral BID  . escitalopram  10 mg Oral Daily  . furosemide  40 mg Oral Daily  . levothyroxine  100 mcg Oral Q0600  . pantoprazole  40 mg Oral Daily  . potassium chloride  40 mEq Oral Daily  . rOPINIRole  0.25 mg Oral BID   Continuous Infusions:  PRN Meds: acetaminophen **OR** acetaminophen, bisacodyl, ipratropium-albuterol, [DISCONTINUED] ondansetron **OR** ondansetron (ZOFRAN) IV, temazepam, traMADol   Vital Signs    Vitals:   09/29/18 1547 09/29/18 1820 09/29/18 2326 09/30/18 0723  BP: (!) 145/52  (!) 128/51 (!) 150/84  Pulse: 61  77 74  Resp:   18 16  Temp: 99 F (37.2 C)  98.1 F (36.7 C) 97.9 F (36.6 C)  TempSrc: Oral  Oral Oral  SpO2: 100% 100% 94% 97%  Weight:      Height:        Intake/Output Summary (Last 24 hours) at 09/30/2018 0944 Last data filed at 09/29/2018 2100 Gross per 24 hour  Intake 240 ml  Output 1100 ml  Net -860 ml   Filed Weights   09/26/18 1207 09/26/18 2246  Weight: 59.9 kg 62.2 kg    Physical Exam   Physical Exam: Blood pressure (!) 150/84, pulse 74, temperature 97.9 F (36.6 C), temperature source Oral, resp. rate 16, height 5' (1.524 m), weight 62.2 kg, SpO2 97 %.  GEN:   Elderly female,   echymosis aross face and forehead  HEENT: Normal NECK: No JVD; No carotid bruits LYMPHATICS: No lymphadenopathy CARDIAC:  RR with frequent premature beats  RESPIRATORY:   Few rales,  Poor inspiratory effort  ABDOMEN: Soft, non-tender, non-distended MUSCULOSKELETAL:   No edema  SKIN: Warm and dry NEUROLOGIC:  Alert and oriented x 3   Labs    Chemistry Recent Labs  Lab  09/27/18 0513  09/28/18 2307 09/29/18 0537 09/30/18 0600  NA 137   < > 134* 135 136  K 3.8   < > 3.6 3.6 3.9  CL 105   < > 104 105 106  CO2 23   < > 24 24 26   GLUCOSE 113*   < > 95 90 98  BUN 36*   < > 37* 36* 35*  CREATININE 1.54*   < > 1.66* 1.56* 1.46*  CALCIUM 8.4*   < > 7.9* 8.0* 8.1*  PROT 5.9*  --   --   --   --   ALBUMIN 2.9*  --   --   --   --   AST 52*  --   --   --   --   ALT 28  --   --   --   --   ALKPHOS 71  --   --   --   --   BILITOT 2.2*  --   --   --   --   GFRNONAA 30*   < > 27* 30* 32*  GFRAA 35*   < > 32* 34* 37*  ANIONGAP 9   < > 6 6 4*   < > = values in this interval not  displayed.     Hematology Recent Labs  Lab 09/26/18 1218 09/27/18 0513 09/28/18 0240  WBC 10.4 13.5* 9.4  RBC 3.45* 3.64* 2.95*  HGB 9.5* 10.1* 8.1*  HCT 32.0* 33.6* 27.6*  MCV 92.8 92.3 93.6  MCH 27.5 27.7 27.5  MCHC 29.7* 30.1 29.3*  RDW 17.1* 16.6* 16.8*  PLT 182 243 203    Cardiac Enzymes Recent Labs  Lab 09/26/18 1218 09/27/18 0513  TROPONINI 0.04* 0.05*      Radiology    No results found.  Telemetry    Sinus rhythm in the low 60s.  Occasional premature ventricular contractions.   Cardiac Studies   2D Echocardiogram1.17.2020  Study Conclusions  - Left ventricle: The cavity size was normal. There was mild concentric hypertrophy. Systolic function was normal. The estimated ejection fraction was in the range of 55% to 60%. Wall motion was normal; there were no regional wall motion abnormalities. The study is not technically sufficient to allow evaluation of LV diastolic function. - Mitral valve: There was mild regurgitation. - Left atrium: The atrium was moderately dilated. - Right ventricle: Systolic function was normal. - Pulmonary arteries: Systolic pressure was within the normal range.  Impressions:  - Rhythm is atrial fibrillation. _____________  Patient Profile     85 y.o. female with history of paroxysmal atrial  fibrillation, chronic diastolic congestive heart failure, stage IV chronic kidney disease, asthma/COPD, hypertension, hypothyroidism, anemia of chronic disease, and Barrett's esophagus, who was admitted 2/19 following fall with finding of bradycardia.  Assessment & Plan    1.  Sinus bradycardia:  She presented following fall and prolonged time on the floor ~ 6 hrs. HR's was in the 40's with soft BP's in the ED and she was placed on low dose dopamine to allow for pressure support and analgesics in the setting of shoulder pain. Dopamine d/c'd 2/20. She is been started on diltiazem for paroxysmal atrial fibrillation.  The diltiazem has been stopped. We will hold off on and not start amiodarone or any other medication at this point.  Her heart rate seems to be stable.  She is maintaining sinus rhythm..  2.  PAF:  S/p DCCV w/ initiation of dilt recently.   We have held off on the diltiazem.  I would not start amiodarone at this point because of the risk of bradycardia.  Continue Eliquis for now. If she makes a significant provement, we will have her see electrophysiology to consider whether or not she needs a pacemaker.  I suspect that she will need a  Pacemaker before we consider adding on additional AV nodal blocking medications.   3. Fall/Rhabdo: CKD 861 on admission in setting of prolonged time on floor.  This has trended down since.  4.  HFpEF: Resume home Lasix dose today. Restate home dose of Losartan 50 mg a day today   5.  CKD IV: renal fxn stable. Follow with resumption of diuretic therapy.  6. HTN:   BP is elevated.   Restart home losartan dose today.  We have held her diltiazem because of bradycardia.  She may need the addition of low-dose amlodipine if her blood pressure remains elevated.   Mertie Moores, MD  09/30/2018 9:46 AM    [place],  [place], [place]  [address] Pager [phone] Phone: [phone]; Fax: [phone]

## 2018-10-01 LAB — BASIC METABOLIC PANEL
Anion gap: 6 (ref 5–15)
BUN: 33 mg/dL — ABNORMAL HIGH (ref 8–23)
CHLORIDE: 104 mmol/L (ref 98–111)
CO2: 25 mmol/L (ref 22–32)
Calcium: 8.1 mg/dL — ABNORMAL LOW (ref 8.9–10.3)
Creatinine, Ser: 1.33 mg/dL — ABNORMAL HIGH (ref 0.44–1.00)
GFR calc Af Amer: 42 mL/min — ABNORMAL LOW (ref >60)
GFR calc non Af Amer: 36 mL/min — ABNORMAL LOW (ref >60)
Glucose, Bld: 95 mg/dL (ref 70–99)
Potassium: 3.8 mmol/L (ref 3.5–5.1)
Sodium: 135 mmol/L (ref 135–145)

## 2018-10-01 LAB — CBC
HEMATOCRIT: 28.1 % — AB (ref 36.0–46.0)
Hemoglobin: 8.7 g/dL — ABNORMAL LOW (ref 12.0–15.0)
MCH: 27.8 pg (ref 26.0–34.0)
MCHC: 31 g/dL (ref 30.0–36.0)
MCV: 89.8 fL (ref 80.0–100.0)
Platelets: 250 10*3/uL (ref 150–400)
RBC: 3.13 MIL/uL — ABNORMAL LOW (ref 3.87–5.11)
RDW: 17.5 % — ABNORMAL HIGH (ref 11.5–15.5)
WBC: 6.6 10*3/uL (ref 4.0–10.5)
nRBC: 0 % (ref 0.0–0.2)

## 2018-10-01 LAB — MAGNESIUM: Magnesium: 2 mg/dL (ref 1.7–2.4)

## 2018-10-01 MED ORDER — POTASSIUM CHLORIDE CRYS ER 10 MEQ PO TBCR
10.0000 meq | EXTENDED_RELEASE_TABLET | Freq: Once | ORAL | Status: AC
  Administered 2018-10-01: 10 meq via ORAL
  Filled 2018-10-01: qty 1

## 2018-10-01 NOTE — Care Management Important Message (Signed)
Important Message  Patient Details  Name: Savannah Wilson MRN: [id number] Date of Birth: [birth year]   Medicare Important Message Given:  Yes    Juliann Pulse A Alaijah Gibler 10/01/2018, 12:01 PM

## 2018-10-01 NOTE — Progress Notes (Addendum)
Clinical Social Worker (CSW) contacted patient's daughter [name] and presented bed offers. She chose [facility]. CSW made daughter aware that patient will have a co-pay at SNF of $10-$20 per day. CSW made daughter aware that [facility] authorization is still pending. CSW contacted [facility] case manager and made her aware that [facility] is the SNF choice. [facility] admissions coordinator at [facility] is aware of accepted bed offer. CSW will continue to follow and assist as needed.   [employer], LCSW [phone]

## 2018-10-01 NOTE — Progress Notes (Signed)
Physical Therapy Treatment Patient Details Name: Savannah Wilson MRN: [id number] DOB: 11-29-1928 Today's Date: 10/01/2018    History of Present Illness Pt is an 85 year old female admitted with rhabdomyolysis and bradycardia following a fall in her home.  Pt was down in her floor for 5 hrs before she was found and has been found to have C5/6 fracture.  PMH includes CKD, depression, atrial fibrillation, Htn, HLD.    PT Comments    Pt agreeable to PT; reports she is moving BUEs better. Pt continues to require Mod A for supine to sit and Max A to return to bed (fatigued post stand attempts). Mild increased time to attain sitting upright posture initially, but able to maintain without physical assist once attained, demonstrating improvement. Pt unable to maintain grasp with RUE on rolling walker long during stand attempts and may benefit from forearm walker if grasp does not improve. Stand attempt two times from elevated position and Mod A. Leans heavily on bed and decreased weight acceptance throughout RLE; denies pain. Only able to tolerate stand for a second before needing to sit. Pt fatigues quickly. Continue PT to progress strength and endurance to continue to improve functional mobility.   Follow Up Recommendations  SNF     Equipment Recommendations  Rolling walker with 5" wheels    Recommendations for Other Services       Precautions / Restrictions Precautions Precautions: Fall Restrictions Weight Bearing Restrictions: No    Mobility  Bed Mobility Overal bed mobility: Needs Assistance Bed Mobility: Supine to Sit;Sit to Supine     Supine to sit: Mod assist Sit to supine: Max assist   General bed mobility comments: Improved ability to mobilize LEs out of bed and scoot hips in supine to/from edge of bed  Transfers Overall transfer level: Needs assistance Equipment used: Rolling walker (2 wheeled) Transfers: Sit to/from Stand Sit to Stand: Mod assist;From elevated surface          General transfer comment: Pt gives good effort, but unable to maintain full stand for more than a second due to weakness. Pt also unable to grasp rw with R hand; may need forearm walker on R  Ambulation/Gait             General Gait Details: unable   Stairs             Wheelchair Mobility    Modified Rankin (Stroke Patients Only)       Balance Overall balance assessment: Needs assistance;History of Falls Sitting-balance support: Feet supported Sitting balance-Leahy Scale: Fair     Standing balance support: Bilateral upper extremity supported Standing balance-Leahy Scale: Poor                              Cognition Arousal/Alertness: Awake/alert Behavior During Therapy: WFL for tasks assessed/performed Overall Cognitive Status: Within Functional Limits for tasks assessed                                 General Comments: Pt notes B arms are getting stronger.       Exercises General Exercises - Lower Extremity Ankle Circles/Pumps: AROM;Both;10 reps;Supine Quad Sets: Strengthening;Both;10 reps;Supine Gluteal Sets: Strengthening;Both;10 reps;Supine Heel Slides: AAROM;AROM;Both;10 reps;Supine    General Comments        Pertinent Vitals/Pain Pain Assessment: No/denies pain    Home Living  Prior Function            PT Goals (current goals can now be found in the care plan section) Progress towards PT goals: Progressing toward goals    Frequency    Min 2X/week      PT Plan Current plan remains appropriate    Co-evaluation              AM-PAC PT "6 Clicks" Mobility   Outcome Measure  Help needed turning from your back to your side while in a flat bed without using bedrails?: A Lot Help needed moving from lying on your back to sitting on the side of a flat bed without using bedrails?: A Lot Help needed moving to and from a bed to a chair (including a wheelchair)?: A  Lot Help needed standing up from a chair using your arms (e.g., wheelchair or bedside chair)?: A Lot Help needed to walk in hospital room?: Total Help needed climbing 3-5 steps with a railing? : Total 6 Click Score: 10    End of Session   Activity Tolerance: Patient limited by fatigue;Other (comment)(weakness) Patient left: in bed;with call bell/phone within reach;with bed alarm set;with family/visitor present   PT Visit Diagnosis: Unsteadiness on feet (R26.81);Muscle weakness (generalized) (M62.81)     Time: 7948-0165 PT Time Calculation (min) (ACUTE ONLY): 26 min  Charges:  $Therapeutic Exercise: 8-22 mins $Therapeutic Activity: 8-22 mins                      Larae Grooms, PTA 10/01/2018, 3:21 PM

## 2018-10-01 NOTE — Progress Notes (Signed)
Progress Note  Patient Name: Savannah Wilson Date of Encounter: 10/01/2018  Primary Cardiologist: Kathlyn Sacramento, MD  Subjective   Patient denied cardiac complaints of chest pain, palpitations, racing heart rate. No further symptoms of presyncope. She did report significant shortness of breath following physical therapy today.   Inpatient Medications    Scheduled Meds: . apixaban  2.5 mg Oral BID  . budesonide  3 mg Oral Daily  . docusate sodium  100 mg Oral BID  . escitalopram  10 mg Oral Daily  . furosemide  40 mg Oral Daily  . levothyroxine  100 mcg Oral Q0600  . losartan  50 mg Oral Daily  . pantoprazole  40 mg Oral Daily  . potassium chloride  10 mEq Oral Daily  . rOPINIRole  0.25 mg Oral BID   Continuous Infusions:  PRN Meds: acetaminophen **OR** acetaminophen, bisacodyl, ipratropium-albuterol, [DISCONTINUED] ondansetron **OR** ondansetron (ZOFRAN) IV, temazepam, traMADol   Vital Signs    Vitals:   09/30/18 0723 09/30/18 1659 09/30/18 2351 10/01/18 0813  BP: (!) 150/84 (!) 148/54 138/61 (!) 148/73  Pulse: 74 (!) 59 74 70  Resp: 16 16 17 18   Temp: 97.9 F (36.6 C) 98 F (36.7 C) 97.9 F (36.6 C) 98.1 F (36.7 C)  TempSrc: Oral Oral Oral Oral  SpO2: 97% 97% 95% 97%  Weight:      Height:        Intake/Output Summary (Last 24 hours) at 10/01/2018 1414 Last data filed at 10/01/2018 0900 Gross per 24 hour  Intake 240 ml  Output 375 ml  Net -135 ml   Filed Weights   09/26/18 1207 09/26/18 2246  Weight: 59.9 kg 62.2 kg    Physical Exam   Physical Exam: Blood pressure (!) 148/73, pulse 70, temperature 98.1 F (36.7 C), temperature source Oral, resp. rate 18, height 5' (1.524 m), weight 62.2 kg, SpO2 97 %.  GEN:   Elderly female, echymosis aross face, around eyes, chin, and forehead  HEENT: Normal NECK: No JVD; No carotid bruits LYMPHATICS: No lymphadenopathy CARDIAC:  RR with frequent premature beats / ectopy RESPIRATORY:   Few rales,  Poor  inspiratory effort  ABDOMEN: Soft, non-tender, non-distended MUSCULOSKELETAL: 1+ bilateral lower extremity edema  SKIN: Warm and dry NEUROLOGIC:  Alert and oriented x 3    Labs    Chemistry Recent Labs  Lab 09/27/18 0513  09/29/18 0537 09/30/18 0600 10/01/18 0444  NA 137   < > 135 136 135  K 3.8   < > 3.6 3.9 3.8  CL 105   < > 105 106 104  CO2 23   < > 24 26 25   GLUCOSE 113*   < > 90 98 95  BUN 36*   < > 36* 35* 33*  CREATININE 1.54*   < > 1.56* 1.46* 1.33*  CALCIUM 8.4*   < > 8.0* 8.1* 8.1*  PROT 5.9*  --   --   --   --   ALBUMIN 2.9*  --   --   --   --   AST 52*  --   --   --   --   ALT 28  --   --   --   --   ALKPHOS 71  --   --   --   --   BILITOT 2.2*  --   --   --   --   GFRNONAA 30*   < > 30* 32* 36*  GFRAA 35*   < >  34* 37* 42*  ANIONGAP 9   < > 6 4* 6   < > = values in this interval not displayed.     Hematology Recent Labs  Lab 09/27/18 0513 09/28/18 0240 10/01/18 0444  WBC 13.5* 9.4 6.6  RBC 3.64* 2.95* 3.13*  HGB 10.1* 8.1* 8.7*  HCT 33.6* 27.6* 28.1*  MCV 92.3 93.6 89.8  MCH 27.7 27.5 27.8  MCHC 30.1 29.3* 31.0  RDW 16.6* 16.8* 17.5*  PLT 243 203 250    Cardiac Enzymes Recent Labs  Lab 09/26/18 1218 09/27/18 0513  TROPONINI 0.04* 0.05*      Radiology    Ct Head Wo Contrast  Result Date: 09/30/2018 CLINICAL DATA:  Right side weakness today. The patient suffered a fall a few days ago. Initial encounter. EXAM: CT HEAD WITHOUT CONTRAST TECHNIQUE: Contiguous axial images were obtained from the base of the skull through the vertex without intravenous contrast. COMPARISON:  Head CT 09/26/2018 and 09/11/2018. FINDINGS: Brain: No evidence of acute infarction, hemorrhage, hydrocephalus, extra-axial collection or mass lesion/mass effect. Mild atrophy and chronic microvascular ischemic change noted. Vascular: No hyperdense vessel or unexpected calcification. Skull: Intact.  No focal lesion. Sinuses/Orbits: Negative. Other: None. IMPRESSION: No acute  abnormality. Mild atrophy and chronic microvascular ischemic change. Electronically Signed   By: Inge Rise M.D.   On: 09/30/2018 11:25    Telemetry    Sinus rhythm with HR 60-80s and intermittent premature ventricular contractions.  Cardiac Studies   2D Echocardiogram1.17.2020  Study Conclusions  - Left ventricle: The cavity size was normal. There was mild concentric hypertrophy. Systolic function was normal. The estimated ejection fraction was in the range of 55% to 60%. Wall motion was normal; there were no regional wall motion abnormalities. The study is not technically sufficient to allow evaluation of LV diastolic function. - Mitral valve: There was mild regurgitation. - Left atrium: The atrium was moderately dilated. - Right ventricle: Systolic function was normal. - Pulmonary arteries: Systolic pressure was within the normal range.  Impressions:  - Rhythm is atrial fibrillation. _____________  Patient Profile     85 y.o. female with history of paroxysmal atrial fibrillation, chronic diastolic congestive heart failure, stage IV chronic kidney disease, asthma/COPD, hypertension, hypothyroidism, anemia of chronic disease, and Barrett's esophagus, who was admitted 2/19 following fall with finding of bradycardia.  Assessment & Plan    Sinus bradycardia with history of recent falls:   - Presented following fall and prolonged time on the floor ~ 6 hrs. HR's was in the 40's with soft BP's in the ED. Placed on low dose dopamine, d/c'd 2/20. Started on diltiazem for paroxysmal atrial fibrillation, now discontinued. Deferred start of amiodarone as heart rate now in 70s and stable and maintaining sinus rhythm but with ectopy. Patient reporting SOB but otherwise no known palpitations felt by patient.  - Recommend orthostatics and ambulation with monitoring of O2 prior to discharge.  Consider also EP follow-up for evaluation of pacemaker and before restarting  AV nodal blocking medications given bradycardia and as this is patient's second fall this month and on current anticoagulation.   PAF on anticoagulation with recent falls:   - S/p 2/7 DCCV after admitted earlier this month d/t elevated ventricular rates in setting of Afib and new onset symptoms. Sinus rhythm this admission with slightly elevated rates but no recommendation for AV nodal blocking medications given recent fall. PTA diltiazem and current admission diltiazem held given the above. On anticoagulation with reduced dosage of Eliquis. - Continue  reduced Eliquis 2.5mg  BID as currently only meets 1 of 3 criteria for reduced dosage. Cr close to cutoff of Cr 1.5 in setting of recent rhabdomyolysis; weight close to cutoff of 60kg. Given h/o recent falls, will continue at reduced dosage for now. Recommend continue to monitor with daily BMET and if continued improved renal function will reassess dosage prior to discharge with consideration of bleeding risk as 2 falls this past month versus recent cardioversion with risk of stroke.  - Most recent TSH low at 0.148 with known h/o hypothyroid on current synthroid. Recommend endocrine follow-up / further labs and to optimize medication/ thyroid function given is a risk factor for return of Atrial Fibrillation with elevated ventricular rates.   Anemia with known CKDIV - Likely anemia of chronic disease but will continue to monitor in setting of recent falls. Hgb 8.7 with previous admission Hgb 8.7-10.1. CT without acute abnormality / bleed s/p fall with patient hitting head for second time this month. Continue anticoagulation for now as above given risk of stroke s/p recent DCCV. Recommend discontinuing Eliquis if further drop in Hgb or if further continued falls in the future. Recommend transfuse with Hgb less than 8.0. Daily CBC.  HFpEF:  - 08/24/2018 EF 55-60%. Bilateral LEE on exam and SOB. Recently resumed home Lasix 40mg  daily. Also recently restarted  losartan, previously held d/t reduced renal function with rhabdo. Continue lasix and losartan.  - Recommend continued KCl supplementation given hypokalemia with KCl currently 80mEq daily and first dosage 2/23. Will give additional KCl 24mEq today given hypokalemia with most recent BMET showing drop from yesterday and repeat BMET tomorrow. Consider discharge with potassium supplementation as no record of PTA potassium on home diuretic. - Daily weights. Monitor I/O. Daily BMET.  Hypokalemia - Replete with goal 4.0. One time additional dose of potassium ordered. Repeat AM BMET. Check Mg.  AKI on CKD IV:  - Cr 1.53  1.33 in setting of Rhabdo with elevated CK on admission of 861 in setting of prolonged time on the floor. CK now downtrending with 2/21 CK 343. Continue daily BMET. Recommendations regarding Eliquis dosage as above.  HTN:    - Still elevated at 148/73. Restarted losartan and diuretic with LEE on exam. Diltiazem held because of bradycardia with recommendation for EP evaluation.   - Recommend addition of low-dose amlodipine if blood pressure remains elevated with continued diuresis and as preserved EF at 55-60% with most recent TTE 08/2018.   Hypothyroid - Recommendations as above for endocrine evaluation / further labs and PTA synthroid medication dosage optimization given abnormal TSH this admission and to decrease risk factors for return of Afib.    Arvil Chaco, PA-C  10/01/2018 2:14 PM    [facility] [place],  [place], [place]  [address] Pager [phone] Phone: [phone]; Fax: [phone]

## 2018-10-01 NOTE — Evaluation (Addendum)
Clinical/Bedside Swallow Evaluation Patient Details  Name: Savannah Wilson MRN: [id number] Date of Birth: [birth year]  Today's Date: 10/01/2018 Time: SLP Start Time (ACUTE ONLY): 0900 SLP Stop Time (ACUTE ONLY): 0930 SLP Time Calculation (min) (ACUTE ONLY): 30 min  Past Medical History:  Past Medical History:  Diagnosis Date  . (HFpEF) heart failure with preserved ejection fraction ([place])    a. 08/2018 Echo: EF 55-60%, no rwma, Mild MR, mod dil LA. Nl RV fxn.  . Asthma   . Barrett esophagus   . Chronic bronchitis   . Chronic kidney disease (CKD), stage IV (severe) ([place])   . Depression   . Diverticulosis   . GERD (gastroesophageal reflux disease)   . Gout   . History of stress test    a. 04/2017 MV: prior inf/inflat infarct w/ peri-infarct ischemia. EF 55-65%. Sev hypotension w/ lexiscan.  . Hyperlipidemia   . Hypertension   . Hypothyroid   . Mitral regurgitation    a. TTE 02/2017: EF 55-60%, normal WM, calcified mitral annulus with mod regurg, moderately dilated LA; b. 08/2018 Echo: Mild MR.  . Pancreatitis due to biliary obstruction March 2010   s/p ERCP sphincterotomy, cholecystectomy  . Peripheral neuralgia   . Persistent atrial fibrillation    a. diagnosed 01/24/17-> CHADS2VASc => 5 (HTN, age x 2, vascular disease with PAD and aortic plaque, female) giving her an estimated annual stroke risk of 6.7%->Eliquis; b. successful DCCV 03/28/2017; c. BB d/c'd 2/2 bradycardia; d. 08/2018 Recurrent Afib-> DCCV-> dilt started.  . Venous insufficiency    Past Surgical History:  Past Surgical History:  Procedure Laterality Date  . ABDOMINAL HYSTERECTOMY    . CARDIOVERSION N/A 03/28/2017   Procedure: CARDIOVERSION;  Surgeon: Nelva Bush, MD;  Location: [facility];  Service: Cardiovascular;  Laterality: N/A;  . CARDIOVERSION N/A 09/14/2018   Procedure: CARDIOVERSION;  Surgeon: Minna Merritts, MD;  Location: [facility];  Service: Cardiovascular;  Laterality: N/A;  . CARPAL TUNNEL RELEASE  jan  2013   [name]  . CATARACT EXTRACTION     right  . CATARACT EXTRACTION  2008  . CHOLECYSTECTOMY  02/10  . CHOLECYSTECTOMY  2010  . TONSILLECTOMY    . VENTRAL HERNIA REPAIR    . VENTRAL HERNIA REPAIR  2007  . VESICOVAGINAL FISTULA CLOSURE W/ TAH     HPI:    Per admitting H&P: pt is a 85 y.o. female with a known history of chronic kidney disease stage IV, depression, atrial fibrillation on Eliquis hypertension hyperlipidemia hypothyroidism, GERD and multiple other medical problems is presenting to the ED after she sustained a fall.  is a 85 y.o. female with a known history of chronic kidney disease stage IV, depression, atrial fibrillation on Eliquis hypertension hyperlipidemia hypothyroidism, GERD and multiple other medical problems is presenting to the ED after she sustained a fall.   Assessment / Plan / Recommendation Clinical Impression  Pt appears to present w/ adequate oropharyngeal swallowing function w/ reduced risk for aspiration when following general aspiration precautions - Pt requires full assist w/ feeding, as pt exhibits UE weakness/stiffness. ST noted pt appeared min anxious, needing extra verbal cues for follow-through. Pt was reclined in bed upon ST entry; sat upright for PO trials. ST administered informal OM exam and found adequate lip, tongue, cheek, and jaw strength. Pt has adequate natural dentition for chewing. Pt reported she has min difficulty w/ swallowing large pills, but did not mention difficulty w/ any other consistencies of food/liquid. ST noted pt ate majority  of breakfast tray and did not report any coughing, choking, throat clearing, etc w/ meal. ST fed ice chip via Spoon - pt appropriately manipulated and swallowed melted ice chip w/ no immediate, overt s/s of aspiration noted. Same was noted w/ PO trials of thin liquids (water) via standard straw (~2 oz). Pt appropriately manipulated PO trials of puree (2 tsp apple sauce) and demonstrated timely A-P transfer  w/ no immediate overt s/s of aspiration noted. Same was noted w/ PO trials of solids (graham cracker) - pt demonstrated adequate rotary chewing, containment of bolus, and timely A-P transfer w/ no immediate overt s/s of aspiration noted. Pt did exhibit delayed throat clear x1, however pt was clearing throat trying to clear phlegm prior to administration of PO trials. NSG to re-consult if any decline of status while admitted.  Of note, pt has a baseline of Barrett's Esophagus, per chart H&P. With any esophageal dysmotility, pt is at risk for aspiration of regurgitated reflux material and potentially pulmonary decline. Recommend f/u w/ GI if indicated for management.   Recommend: Dysphagia III (MECHANICAL soft) - Cut meats w/ extra gravies, butters, sauces for cohesion and decreased exertion at meal times w/ THIN liquids via Standard Straw. Tray setup and FULL assist at meal times d/t UE weakness/stiffness. Meds WHOLE w/ Puree, crushed if needed for easier swallowing.      SLP Visit Diagnosis: Dysphagia, unspecified (R13.10)    Aspiration Risk  Mild aspiration risk, but reduced following general aspiration precautions.    Diet Recommendation  Dysphagia III (MECHANICAL soft) w/ Thin liquids via (standard) STRAW.  Medication Administration: Whole meds with puree - crushed if needed for easier swallowing.   Other  Recommendations Recommended Consults: Other (Comment) Oral Care Recommendations: Oral care BID;Oral care before and after PO   Follow up Recommendations Skilled Nursing facility(TBD)      Frequency and Duration N/A      Prognosis Prognosis for Safe Diet Advancement: Fair Barriers to Reach Goals: Severity of deficits      Swallow Study   General Date of Onset: 09/26/18 HPI:   Per admitting H&P: pt is a 85 y.o. female with a known history of chronic kidney disease stage IV, depression, atrial fibrillation on Eliquis hypertension hyperlipidemia hypothyroidism, GERD and multiple other  medical problems is presenting to the ED after she sustained a fall.  is a 85 y.o. female with a known history of chronic kidney disease stage IV, depression, atrial fibrillation on Eliquis hypertension hyperlipidemia hypothyroidism, GERD and multiple other medical problems is presenting to the ED after she sustained a fall. Type of Study: Bedside Swallow Evaluation Diet Prior to this Study: Regular Temperature Spikes Noted: No Respiratory Status: Room air History of Recent Intubation: No Behavior/Cognition: Alert;Cooperative;Pleasant mood;Distractible;Requires cueing Oral Cavity Assessment: Within Functional Limits Oral Care Completed by SLP: No Oral Cavity - Dentition: Adequate natural dentition Self-Feeding Abilities: Needs set up;Total assist Patient Positioning: Upright in bed;Upright in chair Baseline Vocal Quality: Normal;Low vocal intensity    Oral/Motor/Sensory Function Overall Oral Motor/Sensory Function: Within functional limits   Ice Chips Ice chips: Within functional limits Presentation: Spoon   Thin Liquid Thin Liquid: Within functional limits Presentation: Straw Other Comments: standard straw    Nectar Thick Nectar Thick Liquid: Not tested   Honey Thick Honey Thick Liquid: Not tested   Puree Puree: Within functional limits Presentation: Spoon   Solid     Solid: Within functional limits Presentation: Spoon      Emeline General, Graduate Student SLP 10/01/2018,9:30  AM     

## 2018-10-01 NOTE — Progress Notes (Signed)
[facility] NAME: Kenora Spayd    MR#:  [id number]  DATE OF BIRTH:  1928/12/26  SUBJECTIVE:  CHIEF COMPLAINT:   Chief Complaint  Patient presents with  . Fall   No new complaint this morning.  Patient seen by speech therapist due to intermittent coughing with meals over the weekend. Patient's strength in both upper extremity appears to be improving.  REVIEW OF SYSTEMS:  Review of Systems  Constitutional: Negative for chills and fever.  HENT: Negative for hearing loss and tinnitus.   Eyes: Negative for blurred vision and double vision.  Respiratory: Negative for cough and hemoptysis.   Cardiovascular: Negative for chest pain and palpitations.  Gastrointestinal: Negative for abdominal pain, heartburn, nausea and vomiting.  Genitourinary: Negative for dysuria and urgency.  Musculoskeletal: Negative for myalgias and neck pain.  Skin: Negative for itching and rash.       Bruises on the face secondary to fall  Neurological: Negative for dizziness and headaches.  Psychiatric/Behavioral: Negative for depression and hallucinations.    DRUG ALLERGIES:   Allergies  Allergen Reactions  . Promethazine Anxiety, Palpitations and Other (See Comments)    IV Promethazine 6.25 mg. JIttery, anxious, couldn't lay still, increase HR  . Ace Inhibitors Cough  . Benicar [Olmesartan Medoxomil]     Unsure of reaction type  . Clarithromycin Other (See Comments)    Blisters in mouth  . Clarithromycin Other (See Comments)    Unknown   . Norvasc [Amlodipine Besylate]   . Levofloxacin Rash  . Zithromax [Azithromycin Dihydrate] Rash   VITALS:  Blood pressure (!) 148/73, pulse 70, temperature 98.1 F (36.7 C), temperature source Oral, resp. rate 18, height 5' (1.524 m), weight 62.2 kg, SpO2 97 %. PHYSICAL EXAMINATION:   HENT:     Head: Normocephalic and atraumatic.  Eyes:     Conjunctiva/sclera: Conjunctivae normal.     Pupils: Pupils are  equal, round, and reactive to light.     Comments: Facial bruising and ecchymosis around eyes  Neck:     Musculoskeletal: Normal range of motion and neck supple.     Thyroid: No thyromegaly.     Trachea: No tracheal deviation.  Cardiovascular:     Rate and Rhythm: Normal rate and regular rhythm.     Heart sounds: Normal heart sounds.  Pulmonary:     Effort: Pulmonary effort is normal. No respiratory distress.     Breath sounds: Normal breath sounds. No wheezing.  Chest:     Chest wall: No tenderness.  Abdominal:     General: Bowel sounds are normal. There is no distension.     Palpations: Abdomen is soft.     Tenderness: There is no abdominal tenderness.  Musculoskeletal: Normal range of motion.  Skin:    General: Skin is warm and dry.     Findings: No rash.  Neurological:     Mental Status: She is alert and oriented to person, place, and time.    Generalized weakness more on the right side.  LABORATORY PANEL:  Female CBC Recent Labs  Lab 10/01/18 0444  WBC 6.6  HGB 8.7*  HCT 28.1*  PLT 250   ------------------------------------------------------------------------------------------------------------------ Chemistries  Recent Labs  Lab 09/27/18 0513  10/01/18 0444  NA 137   < > 135  K 3.8   < > 3.8  CL 105   < > 104  CO2 23   < > 25  GLUCOSE 113*   < >  95  BUN 36*   < > 33*  CREATININE 1.54*   < > 1.33*  CALCIUM 8.4*   < > 8.1*  MG  --    < > 2.0  AST 52*  --   --   ALT 28  --   --   ALKPHOS 71  --   --   BILITOT 2.2*  --   --    < > = values in this interval not displayed.   RADIOLOGY:  No results found. ASSESSMENT AND PLAN:     85 y.o.femalewith a history of paroxysmal atrial fibrillation, chronic diastolic congestive heart failure, stage IV chronic kidney disease, asthma/COPD, hypertension, hypothyroidism, anemia of chronic disease, and Barrett's esophagus, admitted s/p fall   1. Sinus bradycardia, hypotension: Resolved. off pressors (dopamine).  Off Diltiazem.  2. PAF: cardio following. Frequent ectopy likely recurrent paroxysmal atrial fibrillation noted on telemetry over the last 24 hours.  Cardiologist following.  Avoiding adding antiarrhythmics at this time.  Patient will require outpatient EP follow-up and consideration for PPM in the setting of tachybradycardia syndrome.  Patient agreeable with plan.  Patient already on anticoagulation with Eliquis.  3. Rhabdomyolysis s/p mechanical fall: monitor CK - improved with IVFs  4. HFpEF: Euvolemic. HR/BP stable. Off pressors  5. CKD IV: Creat stable.  6. AcuteC5-6 spinous process fx: Conservative mgmt per neurosurgery Physical therapy working with patient.  Plans for rehab placement if approved by insurance company.  Pain management as needed.  With upper extremity weakness gradually improving.  Still has significant weakness on right lower extremity.  Patient has had 2 CT scan of the head without contrast done during this admission with no evidence of CVA. Physical therapy consult placed to evaluate and treat with recommendations  7. Hypothyroidism continue Synthyroid  8.  Hypomagnesemia; replaced  9.  Dysphagia.  Patient seen by speech therapist.  Recommended dysphagia 3 with thin liquids and current meds.  Meds with pure or crushed if needed for easier swallowing.  Full assistance with meal due to upper extremity weakness.  DVT prophylaxis; patient already on Eliquis.  Disposition; case manager working rehab placement.  All the records are reviewed and case discussed with Care Management/Social Worker. Management plans discussed with the patient, family and they are in agreement.  CODE STATUS: DNR  TOTAL TIME TAKING CARE OF THIS PATIENT: 25 minutes.   More than 50% of the time was spent in counseling/coordination of care: YES  POSSIBLE D/C IN 2 DAYS, DEPENDING ON CLINICAL CONDITION.   Emileigh Kellett M.D on 10/01/2018 at 12:45 PM  Between 7am to 6pm - Pager  - [phone]  After 6pm go to [email or web address] - Proofreader  [facility]  Office  [phone]  CC: Primary care physician; [name], MD  Note: This dictation was prepared with Dragon dictation along with smaller phrase technology. Any transcriptional errors that result from this process are unintentional.

## 2018-10-01 NOTE — Clinical Social Work Placement (Signed)
   CLINICAL SOCIAL WORK PLACEMENT  NOTE  Date:  10/01/2018  Patient Details  Name: Savannah Wilson MRN: [id number] Date of Birth: Mar 12, 1929  Clinical Social Work is seeking post-discharge placement for this patient at the [place] level of care (*CSW will initial, date and re-position this form in  chart as items are completed):  Yes   Patient/family provided with [facility]'s list of facilities offering this level of care within the geographic area requested by the patient (or if unable, by the patient's family).  Yes   Patient/family informed of their freedom to choose among providers that offer the needed level of care, that participate in Medicare, Medicaid or managed care program needed by the patient, have an available bed and are willing to accept the patient.  Yes   Patient/family informed of [facility]'s ownership interest in [facility] and [facility], as well as of the fact that they are under no obligation to receive care at these facilities.  PASRR submitted to EDS on 09/29/18     PASRR number received on 09/29/18     Existing PASRR number confirmed on       FL2 transmitted to all facilities in geographic area requested by pt/family on 09/29/18     FL2 transmitted to all facilities within larger geographic area on       Patient informed that his/her managed care company has contracts with or will negotiate with certain facilities, including the following:        Yes   Patient/family informed of bed offers received.  Patient chooses bed at [facility] )     Physician recommends and patient chooses bed at      Patient to be transferred to   on  .  Patient to be transferred to facility by       Patient family notified on   of transfer.  Name of family member notified:        PHYSICIAN       Additional Comment:    _______________________________________________ Antonino Nienhuis, Veronia Beets, LCSW 10/01/2018,  12:27 PM

## 2018-10-01 NOTE — Progress Notes (Signed)
Health [facility] denied SNF and offered a peer to peer. Per MD he will call [facility] medical director tomorrow morning to complete peer to peer. Patient's daughter [name] is aware of above.   [employer], LCSW [phone]

## 2018-10-01 NOTE — Progress Notes (Signed)
[facility] case manager is requesting a PT note to show if patient can ambulate. Patient has not been able to ambulate with PT yet. PT is aware of above and will see patient today.   [employer], LCSW [phone]

## 2018-10-02 DIAGNOSIS — I480 Paroxysmal atrial fibrillation: Secondary | ICD-10-CM

## 2018-10-02 LAB — CBC WITH DIFFERENTIAL/PLATELET
Abs Immature Granulocytes: 0.05 10*3/uL (ref 0.00–0.07)
Basophils Absolute: 0 10*3/uL (ref 0.0–0.1)
Basophils Relative: 0 %
Eosinophils Absolute: 0.2 10*3/uL (ref 0.0–0.5)
Eosinophils Relative: 3 %
HCT: 29.1 % — ABNORMAL LOW (ref 36.0–46.0)
Hemoglobin: 8.9 g/dL — ABNORMAL LOW (ref 12.0–15.0)
Immature Granulocytes: 1 %
Lymphocytes Relative: 16 %
Lymphs Abs: 1.1 10*3/uL (ref 0.7–4.0)
MCH: 28.1 pg (ref 26.0–34.0)
MCHC: 30.6 g/dL (ref 30.0–36.0)
MCV: 91.8 fL (ref 80.0–100.0)
Monocytes Absolute: 0.5 10*3/uL (ref 0.1–1.0)
Monocytes Relative: 7 %
NEUTROS PCT: 73 %
NRBC: 0 % (ref 0.0–0.2)
Neutro Abs: 4.8 10*3/uL (ref 1.7–7.7)
Platelets: 256 10*3/uL (ref 150–400)
RBC: 3.17 MIL/uL — AB (ref 3.87–5.11)
RDW: 17.9 % — ABNORMAL HIGH (ref 11.5–15.5)
WBC: 6.7 10*3/uL (ref 4.0–10.5)

## 2018-10-02 LAB — BASIC METABOLIC PANEL
ANION GAP: 8 (ref 5–15)
BUN: 32 mg/dL — ABNORMAL HIGH (ref 8–23)
CO2: 25 mmol/L (ref 22–32)
Calcium: 7.9 mg/dL — ABNORMAL LOW (ref 8.9–10.3)
Chloride: 102 mmol/L (ref 98–111)
Creatinine, Ser: 1.54 mg/dL — ABNORMAL HIGH (ref 0.44–1.00)
GFR calc Af Amer: 35 mL/min — ABNORMAL LOW (ref >60)
GFR calc non Af Amer: 30 mL/min — ABNORMAL LOW (ref >60)
Glucose, Bld: 102 mg/dL — ABNORMAL HIGH (ref 70–99)
Potassium: 4.1 mmol/L (ref 3.5–5.1)
Sodium: 135 mmol/L (ref 135–145)

## 2018-10-02 NOTE — Progress Notes (Signed)
[facility] Team peer to peer is still pending.  [employer], LCSW [phone]

## 2018-10-02 NOTE — Progress Notes (Signed)
Progress Note  Patient Name: Savannah Wilson Date of Encounter: 10/02/2018  Primary Cardiologist: Kathlyn Sacramento, MD  Subjective   Patient continues to deny cardiac complaints of CP and SOB. No further symptoms of presyncope. She has been working with PT and stated she is eager to be discharged to SNF with further PT to continue working on getting stronger and prevent future falls. She confirmed ambulation with a walker and agreed she will be careful to avoid any further falls.  Inpatient Medications    Scheduled Meds: . apixaban  2.5 mg Oral BID  . budesonide  3 mg Oral Daily  . docusate sodium  100 mg Oral BID  . escitalopram  10 mg Oral Daily  . furosemide  40 mg Oral Daily  . levothyroxine  100 mcg Oral Q0600  . losartan  50 mg Oral Daily  . pantoprazole  40 mg Oral Daily  . potassium chloride  10 mEq Oral Daily  . rOPINIRole  0.25 mg Oral BID   Continuous Infusions:  PRN Meds: acetaminophen **OR** acetaminophen, bisacodyl, ipratropium-albuterol, [DISCONTINUED] ondansetron **OR** ondansetron (ZOFRAN) IV, temazepam, traMADol   Vital Signs    Vitals:   10/01/18 1414 10/01/18 1530 10/01/18 2239 10/02/18 0827  BP:  111/60 (!) 128/59 (!) 128/51  Pulse: 74 65 79 74  Resp:  18 18 18   Temp:  98.2 F (36.8 C) 98 F (36.7 C) (!) 97.5 F (36.4 C)  TempSrc:  Oral Oral Oral  SpO2: 96% 94% 97% 93%  Weight:      Height:        Intake/Output Summary (Last 24 hours) at 10/02/2018 1434 Last data filed at 10/02/2018 0601 Gross per 24 hour  Intake -  Output 400 ml  Net -400 ml   Filed Weights   09/26/18 1207 09/26/18 2246  Weight: 59.9 kg 62.2 kg    Physical Exam   Physical Exam: Blood pressure (!) 128/51, pulse 74, temperature (!) 97.5 F (36.4 C), temperature source Oral, resp. rate 18, height 5' (1.524 m), weight 62.2 kg, SpO2 93 %.  GEN:   Elderly female, echymosis aross forehead and chin as well as bilateral ecchymosis around eyes HEENT: Normal NECK: No JVD; No  carotid bruits LYMPHATICS: No lymphadenopathy CARDIAC:  RRR with frequent premature beats / ectopy ABDOMEN: Soft, non-tender, non-distended MUSCULOSKELETAL: trace bilateral lower extremity edema  SKIN: Warm and dry NEUROLOGIC:  Alert and oriented x 3    Labs    Chemistry Recent Labs  Lab 09/27/18 0513  09/29/18 0537 09/30/18 0600 10/01/18 0444  NA 137   < > 135 136 135  K 3.8   < > 3.6 3.9 3.8  CL 105   < > 105 106 104  CO2 23   < > 24 26 25   GLUCOSE 113*   < > 90 98 95  BUN 36*   < > 36* 35* 33*  CREATININE 1.54*   < > 1.56* 1.46* 1.33*  CALCIUM 8.4*   < > 8.0* 8.1* 8.1*  PROT 5.9*  --   --   --   --   ALBUMIN 2.9*  --   --   --   --   AST 52*  --   --   --   --   ALT 28  --   --   --   --   ALKPHOS 71  --   --   --   --   BILITOT 2.2*  --   --   --   --  GFRNONAA 30*   < > 30* 32* 36*  GFRAA 35*   < > 34* 37* 42*  ANIONGAP 9   < > 6 4* 6   < > = values in this interval not displayed.     Hematology Recent Labs  Lab 09/27/18 0513 09/28/18 0240 10/01/18 0444  WBC 13.5* 9.4 6.6  RBC 3.64* 2.95* 3.13*  HGB 10.1* 8.1* 8.7*  HCT 33.6* 27.6* 28.1*  MCV 92.3 93.6 89.8  MCH 27.7 27.5 27.8  MCHC 30.1 29.3* 31.0  RDW 16.6* 16.8* 17.5*  PLT 243 203 250    Cardiac Enzymes Recent Labs  Lab 09/26/18 1218 09/27/18 0513  TROPONINI 0.04* 0.05*      Radiology    No results found.  Telemetry    Sinus rhythm, HR high 50s to 80s, intermittent premature ventricular contractions.  Cardiac Studies   2D Echocardiogram1.17.2020  Study Conclusions  - Left ventricle: The cavity size was normal. There was mild concentric hypertrophy. Systolic function was normal. The estimated ejection fraction was in the range of 55% to 60%. Wall motion was normal; there were no regional wall motion abnormalities. The study is not technically sufficient to allow evaluation of LV diastolic function. - Mitral valve: There was mild regurgitation. - Left atrium: The  atrium was moderately dilated. - Right ventricle: Systolic function was normal. - Pulmonary arteries: Systolic pressure was within the normal range.  Impressions:  - Rhythm is atrial fibrillation. _____________  Patient Profile     85 y.o. female with history of paroxysmal atrial fibrillation, chronic diastolic congestive heart failure, stage IV chronic kidney disease, asthma/COPD, hypertension, hypothyroidism, anemia of chronic disease, and Barrett's esophagus, who was admitted 2/19 following fall with finding of bradycardia.  Assessment & Plan    Sinus bradycardia with history of recent falls:   - Presented following fall and prolonged time on the floor. At presentation, HR 40's with soft BP's. Received dopamine and diltiazem, both discontinued as patient maintaining sinus rhythm with ectopy.  - No rate control medication d/t bradycardia. Rates high 50s-80s. No need for pacemaker evaluation at this time; however, agree with SNF and continued PT given patient is severely deconditioned and has had two falls in the last month. If recurrent Afib develops with future need for rate control or antiarrhythmic medication, EP evaluation and consideration of PPM as backup should be considered at that time.   PAF on anticoagulation with recent falls:   - S/p 2/7 DCCV d/t persistent Afib with RVR and new onset symptoms. Maintaining sinus rhythm this admission.  - Continue reduced dose Eliquis 2.5mg  BID as officially only meets 1 of 3 criteria for reduced dosage; however, given Cr close to cutoff of Cr 1.5 /weight close to cutoff of 60kg and h/o recent falls (2 in last month, hitting her head) will continue at reduced dosage for now. If further falls while on anticoagulation, will need to reconsider risk / benefit of anticoagulation. As above, no rate control recommended. No antiarrhythmic as in SR. Most recent TSH low at 0.148 and recommend PCP follow-up to optimize medication given h/o Afib /  elevated rates.   Anemia with known CKDIV - Likely anemia of chronic disease but will continue to monitor in setting of recent falls. CT without acute abnormality s/p fall. Hgb appears within baseline range as previous admission Hgb 8.7-10.1 and 2/24 Hgb 8.7. Will order CBC for today.  HFpEF:  - 08/24/2018 EF 55-60%. Continue lasix and losartan. Consider discharge with potassium supplementation as  no record of PTA potassium on home diuretic and hypokalemic this admission.   Hypokalemia - Replete with goal 4.0. Consider adding home KCl supplementation at discharge as on PTA diuresis and follow-up outpatient BMET to reassess. 2/24 K at 3.8 with goal 4.0. Will order repeat BMET for today.  AKI on CKD IV:  - Cr 1.53  1.33 in setting of Rhabdo with elevated CK on admission of 861 in setting of prolonged time on the floor ~ 6h. CK now downtrending with 2/21 CK 343. Ordering BMET. Recommendations regarding Eliquis dosage as above.  HTN:    - Currently controlled with SBP in 120s after restarted losartan and diuretic yesterday  Signed, Arvil Chaco, PA-C 10/02/2018, 3:16 PM Pager [phone]

## 2018-10-02 NOTE — Progress Notes (Signed)
[facility] NAME: Savannah Wilson    MR#:  [id number]  DATE OF BIRTH:  22-Sep-1928  SUBJECTIVE:  CHIEF COMPLAINT:   Chief Complaint  Patient presents with  . Fall   No new complaint this morning.   Patient's strength continues to gradually improve.  Moving both upper extremities more as well as right lower extremity.  Physical therapy walking with patient.   Updated daughter present at bedside on treatment plans.     REVIEW OF SYSTEMS:  Review of Systems  Constitutional: Negative for chills and fever.  HENT: Negative for hearing loss and tinnitus.   Eyes: Negative for blurred vision and double vision.  Respiratory: Negative for cough and hemoptysis.   Cardiovascular: Negative for chest pain and palpitations.  Gastrointestinal: Negative for abdominal pain, heartburn, nausea and vomiting.  Genitourinary: Negative for dysuria and urgency.  Musculoskeletal: Negative for myalgias and neck pain.  Skin: Negative for itching and rash.       Bruises on the face secondary to fall  Neurological: Negative for dizziness and headaches.  Psychiatric/Behavioral: Negative for depression and hallucinations.    DRUG ALLERGIES:   Allergies  Allergen Reactions  . Promethazine Anxiety, Palpitations and Other (See Comments)    IV Promethazine 6.25 mg. JIttery, anxious, couldn't lay still, increase HR  . Ace Inhibitors Cough  . Benicar [Olmesartan Medoxomil]     Unsure of reaction type  . Clarithromycin Other (See Comments)    Blisters in mouth  . Clarithromycin Other (See Comments)    Unknown   . Norvasc [Amlodipine Besylate]   . Levofloxacin Rash  . Zithromax [Azithromycin Dihydrate] Rash   VITALS:  Blood pressure (!) 128/51, pulse 74, temperature (!) 97.5 F (36.4 C), temperature source Oral, resp. rate 18, height 5' (1.524 m), weight 62.2 kg, SpO2 93 %. PHYSICAL EXAMINATION:   HENT:     Head: Normocephalic and atraumatic.  Eyes:       Conjunctiva/sclera: Conjunctivae normal.     Pupils: Pupils are equal, round, and reactive to light.     Comments: Facial bruising and ecchymosis around eyes  Neck:     Musculoskeletal: Normal range of motion and neck supple.     Thyroid: No thyromegaly.     Trachea: No tracheal deviation.  Cardiovascular:     Rate and Rhythm: Normal rate and regular rhythm.     Heart sounds: Normal heart sounds.  Pulmonary:     Effort: Pulmonary effort is normal. No respiratory distress.     Breath sounds: Normal breath sounds. No wheezing.  Chest:     Chest wall: No tenderness.  Abdominal:     General: Bowel sounds are normal. There is no distension.     Palpations: Abdomen is soft.     Tenderness: There is no abdominal tenderness.  Musculoskeletal: Normal range of motion.  Skin:    General: Skin is warm and dry.     Findings: No rash.  Neurological:     Mental Status: She is alert and oriented to person, place, and time.    Generalized weakness more on the right side.  LABORATORY PANEL:  Female CBC Recent Labs  Lab 10/01/18 0444  WBC 6.6  HGB 8.7*  HCT 28.1*  PLT 250   ------------------------------------------------------------------------------------------------------------------ Chemistries  Recent Labs  Lab 09/27/18 0513  10/01/18 0444  NA 137   < > 135  K 3.8   < > 3.8  CL 105   < >  104  CO2 23   < > 25  GLUCOSE 113*   < > 95  BUN 36*   < > 33*  CREATININE 1.54*   < > 1.33*  CALCIUM 8.4*   < > 8.1*  MG  --    < > 2.0  AST 52*  --   --   ALT 28  --   --   ALKPHOS 71  --   --   BILITOT 2.2*  --   --    < > = values in this interval not displayed.   RADIOLOGY:  No results found. ASSESSMENT AND PLAN:     85 y.o.femalewith a history of paroxysmal atrial fibrillation, chronic diastolic congestive heart failure, stage IV chronic kidney disease, asthma/COPD, hypertension, hypothyroidism, anemia of chronic disease, and Barrett's esophagus, admitted s/p fall   1.  Sinus bradycardia, hypotension: Resolved. off pressors (dopamine). Off Diltiazem.  2. PAF: cardio following. Frequent ectopy likely recurrent paroxysmal atrial fibrillation noted on telemetry over the last 24 hours.  Cardiologist following.  Avoiding adding antiarrhythmics at this time.  Patient will require outpatient EP follow-up and consideration for PPM in the setting of tachybradycardia syndrome.  Patient agreeable with plan.  Patient already on anticoagulation with Eliquis.  3. Rhabdomyolysis s/p mechanical fall: monitor CK - improved with IVFs  4. HFpEF: Euvolemic. HR/BP stable. Off pressors  5. CKD IV: Creat stable.  6. AcuteC5-6 spinous process fx: Conservative mgmt per neurosurgery Physical therapy working with patient.  Plans for rehab placement if approved by insurance company.  Pain management as needed.   Patient strength gradually improving.  Moving both upper extremities better.  Strengthening right lower extremity improving.  Physical therapy working with patient.  Patient was functional and living alone prior to this admission.  Continues to show improvement gradually.  I strongly believe patient will benefit from rehab to be able to return to prior level of functioning.   Notified by Education officer, museum about skilled nursing facility placement was declined.   I have placed a call for peer to peer review.  Awaiting callback  Physical therapy continues to work with patient with gradual improvement. Patient has had 2 CT scan of the head without contrast done during this admission with no evidence of CVA.  7. Hypothyroidism continue Synthyroid  8.  Hypomagnesemia; replaced  9.  Dysphagia.  Patient seen by speech therapist.  Recommended dysphagia 3 with thin liquids and current meds.  Meds with pure or crushed if needed for easier swallowing.  Full assistance with meal due to upper extremity weakness.  DVT prophylaxis; patient already on Eliquis.  Disposition;  case manager working rehab placement.  All the records are reviewed and case discussed with Care Management/Social Worker. Management plans discussed with the patient, family and they are in agreement.  CODE STATUS: DNR  TOTAL TIME TAKING CARE OF THIS PATIENT: 26 minutes.   More than 50% of the time was spent in counseling/coordination of care: YES  POSSIBLE D/C IN 1-2 DAYS, DEPENDING ON CLINICAL CONDITION.   Gamal Todisco M.D on 10/02/2018 at 2:08 PM  Between 7am to 6pm - Pager - [phone]  After 6pm go to [email or web address] - Proofreader  [facility]  Office  [phone]  CC: Primary care physician; [name], MD  Note: This dictation was prepared with Dragon dictation along with smaller phrase technology. Any transcriptional errors that result from this process are unintentional.

## 2018-10-02 NOTE — Progress Notes (Signed)
Physical Therapy Treatment Patient Details Name: Savannah Wilson MRN: [id number] DOB: [birth year] Today's Date: 10/02/2018    History of Present Illness Pt is an 85 year old female admitted with rhabdomyolysis and bradycardia following a fall in her home.  Pt was down in her floor for 5 hrs before she was found and has been found to have C5/6 fracture.  PMH includes CKD, depression, atrial fibrillation, Htn, HLD.    PT Comments    Patient received in bed, agrees to PT. Patient reports she was very afraid during therapy yesterday. Tearful and apologetic. Provided +2 assist due to patient's fears. Patient requires +2 assist for bed mobility, and transfer to recliner. R UE and LE weaker than left. Requiring assist to perform exercises on the right LE. Patient will continue to benefit from skilled PT to address her weakness, functional limitations and to achieve maximal independence level.           Follow Up Recommendations  SNF;Supervision/Assistance - 24 hour     Equipment Recommendations  Rolling walker with 5" wheels    Recommendations for Other Services       Precautions / Restrictions Precautions Precautions: Fall Restrictions Weight Bearing Restrictions: No    Mobility  Bed Mobility Overal bed mobility: Needs Assistance Bed Mobility: Supine to Sit     Supine to sit: Max assist;+2 for physical assistance        Transfers Overall transfer level: Needs assistance Equipment used: None Transfers: Sit to/from [facility] Sit to Stand: +2 physical assistance;Total assist Stand pivot transfers: +2 physical assistance;Total assist          Ambulation/Gait             General Gait Details: patient unable to ambulate at this time   Stairs             Wheelchair Mobility    Modified Rankin (Stroke Patients Only)       Balance Overall balance assessment: Needs assistance Sitting-balance support: No upper extremity supported;Feet  supported Sitting balance-Leahy Scale: Fair Sitting balance - Comments: requires cues to maintain upright sitting balance, occasional assist needed to prevent LOB.      Standing balance-Leahy Scale: Poor Standing balance comment: requires max assist +2 for standing balance                            Cognition Arousal/Alertness: Awake/alert Behavior During Therapy: WFL for tasks assessed/performed Overall Cognitive Status: Within Functional Limits for tasks assessed                                 General Comments: patient reports she was very afraid yesterday, apologetic, tearful      Exercises Total Joint Exercises Ankle Circles/Pumps: AROM;AAROM;10 reps;Both(AAROM on right) Heel Slides: AROM;AAROM;10 reps;Both;Supine(AAROM on right) Hip ABduction/ADduction: AROM;AAROM;10 reps;Both;Supine(AAROM on right)    General Comments        Pertinent Vitals/Pain Pain Assessment: Faces Faces Pain Scale: Hurts a little bit Pain Location: neck, right knee with exercise Pain Descriptors / Indicators: Aching;Discomfort;Sore Pain Intervention(s): Monitored during session;Repositioned    Home Living                      Prior Function            PT Goals (current goals can now be found in the care plan section) Acute Rehab  PT Goals Patient Stated Goal: To manage pain, to go to rehab PT Goal Formulation: With patient Time For Goal Achievement: 10/11/18 Potential to Achieve Goals: Good Progress towards PT goals: Progressing toward goals    Frequency    Min 2X/week      PT Plan Current plan remains appropriate    Co-evaluation              AM-PAC PT "6 Clicks" Mobility   Outcome Measure  Help needed turning from your back to your side while in a flat bed without using bedrails?: A Lot Help needed moving from lying on your back to sitting on the side of a flat bed without using bedrails?: A Lot Help needed moving to and from a bed  to a chair (including a wheelchair)?: Total Help needed standing up from a chair using your arms (e.g., wheelchair or bedside chair)?: Total Help needed to walk in hospital room?: Total Help needed climbing 3-5 steps with a railing? : Total 6 Click Score: 8    End of Session Equipment Utilized During Treatment: Gait belt Activity Tolerance: Patient limited by fatigue;Other (comment)(patient limited by fear, weakness ) Patient left: in chair;with call bell/phone within reach;with family/visitor present Nurse Communication: Mobility status       Time: 1100-1130 PT Time Calculation (min) (ACUTE ONLY): 30 min  Charges:  $Therapeutic Exercise: 8-22 mins $Therapeutic Activity: 8-22 mins                     Jenene Kauffmann, PT, GCS 10/02/18,11:41 AM

## 2018-10-02 NOTE — Clinical Social Work Note (Signed)
CSW has been notified that patient has received authorization for [facility]. Per [facility] patient has approval number [id number] and is good for 7 days. CSW notified Magda Paganini at [facility] of approval. [facility] will continue to follow for discharge planning.   [place], [place]

## 2018-10-03 DIAGNOSIS — S022XXD Fracture of nasal bones, subsequent encounter for fracture with routine healing: Secondary | ICD-10-CM | POA: Diagnosis not present

## 2018-10-03 DIAGNOSIS — R296 Repeated falls: Secondary | ICD-10-CM | POA: Diagnosis not present

## 2018-10-03 DIAGNOSIS — I959 Hypotension, unspecified: Secondary | ICD-10-CM | POA: Diagnosis not present

## 2018-10-03 DIAGNOSIS — Z23 Encounter for immunization: Secondary | ICD-10-CM | POA: Diagnosis not present

## 2018-10-03 DIAGNOSIS — M792 Neuralgia and neuritis, unspecified: Secondary | ICD-10-CM | POA: Diagnosis not present

## 2018-10-03 DIAGNOSIS — S129XXA Fracture of neck, unspecified, initial encounter: Secondary | ICD-10-CM | POA: Diagnosis not present

## 2018-10-03 DIAGNOSIS — I4891 Unspecified atrial fibrillation: Secondary | ICD-10-CM | POA: Diagnosis not present

## 2018-10-03 DIAGNOSIS — G4700 Insomnia, unspecified: Secondary | ICD-10-CM | POA: Diagnosis not present

## 2018-10-03 DIAGNOSIS — T796XXA Traumatic ischemia of muscle, initial encounter: Secondary | ICD-10-CM | POA: Diagnosis not present

## 2018-10-03 DIAGNOSIS — S022XXA Fracture of nasal bones, initial encounter for closed fracture: Secondary | ICD-10-CM | POA: Diagnosis not present

## 2018-10-03 DIAGNOSIS — S12501D Unspecified nondisplaced fracture of sixth cervical vertebra, subsequent encounter for fracture with routine healing: Secondary | ICD-10-CM | POA: Diagnosis not present

## 2018-10-03 DIAGNOSIS — J449 Chronic obstructive pulmonary disease, unspecified: Secondary | ICD-10-CM | POA: Diagnosis not present

## 2018-10-03 DIAGNOSIS — W19XXXD Unspecified fall, subsequent encounter: Secondary | ICD-10-CM | POA: Diagnosis not present

## 2018-10-03 DIAGNOSIS — R1311 Dysphagia, oral phase: Secondary | ICD-10-CM | POA: Diagnosis not present

## 2018-10-03 DIAGNOSIS — K5790 Diverticulosis of intestine, part unspecified, without perforation or abscess without bleeding: Secondary | ICD-10-CM | POA: Diagnosis not present

## 2018-10-03 DIAGNOSIS — T796XXD Traumatic ischemia of muscle, subsequent encounter: Secondary | ICD-10-CM | POA: Diagnosis not present

## 2018-10-03 DIAGNOSIS — G2581 Restless legs syndrome: Secondary | ICD-10-CM | POA: Diagnosis not present

## 2018-10-03 DIAGNOSIS — D631 Anemia in chronic kidney disease: Secondary | ICD-10-CM | POA: Diagnosis not present

## 2018-10-03 DIAGNOSIS — I700 Atherosclerosis of aorta: Secondary | ICD-10-CM | POA: Diagnosis not present

## 2018-10-03 DIAGNOSIS — S12401D Unspecified nondisplaced fracture of fifth cervical vertebra, subsequent encounter for fracture with routine healing: Secondary | ICD-10-CM | POA: Diagnosis not present

## 2018-10-03 DIAGNOSIS — M2550 Pain in unspecified joint: Secondary | ICD-10-CM | POA: Diagnosis not present

## 2018-10-03 DIAGNOSIS — E785 Hyperlipidemia, unspecified: Secondary | ICD-10-CM | POA: Diagnosis not present

## 2018-10-03 DIAGNOSIS — Y92009 Unspecified place in unspecified non-institutional (private) residence as the place of occurrence of the external cause: Secondary | ICD-10-CM | POA: Diagnosis not present

## 2018-10-03 DIAGNOSIS — I130 Hypertensive heart and chronic kidney disease with heart failure and stage 1 through stage 4 chronic kidney disease, or unspecified chronic kidney disease: Secondary | ICD-10-CM | POA: Diagnosis not present

## 2018-10-03 DIAGNOSIS — K2270 Barrett's esophagus without dysplasia: Secondary | ICD-10-CM | POA: Diagnosis not present

## 2018-10-03 DIAGNOSIS — Z7401 Bed confinement status: Secondary | ICD-10-CM | POA: Diagnosis not present

## 2018-10-03 DIAGNOSIS — R1310 Dysphagia, unspecified: Secondary | ICD-10-CM | POA: Diagnosis not present

## 2018-10-03 DIAGNOSIS — W010XXA Fall on same level from slipping, tripping and stumbling without subsequent striking against object, initial encounter: Secondary | ICD-10-CM | POA: Diagnosis not present

## 2018-10-03 DIAGNOSIS — N184 Chronic kidney disease, stage 4 (severe): Secondary | ICD-10-CM | POA: Diagnosis not present

## 2018-10-03 DIAGNOSIS — K219 Gastro-esophageal reflux disease without esophagitis: Secondary | ICD-10-CM | POA: Diagnosis not present

## 2018-10-03 DIAGNOSIS — I480 Paroxysmal atrial fibrillation: Secondary | ICD-10-CM | POA: Diagnosis not present

## 2018-10-03 DIAGNOSIS — W19XXXA Unspecified fall, initial encounter: Secondary | ICD-10-CM

## 2018-10-03 DIAGNOSIS — J45909 Unspecified asthma, uncomplicated: Secondary | ICD-10-CM | POA: Diagnosis not present

## 2018-10-03 DIAGNOSIS — M109 Gout, unspecified: Secondary | ICD-10-CM | POA: Diagnosis not present

## 2018-10-03 DIAGNOSIS — F329 Major depressive disorder, single episode, unspecified: Secondary | ICD-10-CM | POA: Diagnosis not present

## 2018-10-03 DIAGNOSIS — I5032 Chronic diastolic (congestive) heart failure: Secondary | ICD-10-CM | POA: Diagnosis not present

## 2018-10-03 DIAGNOSIS — I10 Essential (primary) hypertension: Secondary | ICD-10-CM | POA: Diagnosis not present

## 2018-10-03 DIAGNOSIS — E039 Hypothyroidism, unspecified: Secondary | ICD-10-CM | POA: Diagnosis not present

## 2018-10-03 DIAGNOSIS — M6282 Rhabdomyolysis: Secondary | ICD-10-CM | POA: Diagnosis not present

## 2018-10-03 MED ORDER — TRAMADOL HCL 50 MG PO TABS: 50.0000 mg | ORAL_TABLET | Freq: Four times a day (QID) | ORAL | 0 refills | Status: DC | PRN

## 2018-10-03 MED ORDER — BISACODYL 5 MG PO TBEC: 5.0000 mg | DELAYED_RELEASE_TABLET | Freq: Every day | ORAL | 0 refills | Status: AC | PRN

## 2018-10-03 MED ORDER — POTASSIUM CHLORIDE CRYS ER 10 MEQ PO TBCR: 10.0000 meq | EXTENDED_RELEASE_TABLET | Freq: Every day | ORAL | 0 refills | Status: AC

## 2018-10-03 NOTE — Progress Notes (Signed)
Speech Therapy Note: received call from [employer] and pt that she was requesting a Regular diet in order to have a "cheeseburger". Met w/, and spoke w/, pt on the differences in the diet consistency (meats were being cut, cohesive foods). Pt and Caregiver agreed stating "they would cut if for her". Reviewed general aspiration precautions w/ Caregiver and pt. Diet upgraded to Regular consistency per pt, Caregiver request. ST services will be available for any further questions/education as needed while admitted. NSG updated.    Orinda Kenner, [place], CCC-SLP

## 2018-10-03 NOTE — Progress Notes (Signed)
Progress Note  Patient Name: Savannah Wilson Date of Encounter: 10/03/2018  Primary Cardiologist: Kathlyn [place], MD  Subjective   Reports she is slowly improving, less pain, able to move her right arm somewhat more Feeling stronger every day Going to [facility] for rehab Looks forward to getting stronger so she can go back home  Inpatient Medications    Scheduled Meds: . apixaban  2.5 mg Oral BID  . budesonide  3 mg Oral Daily  . docusate sodium  100 mg Oral BID  . escitalopram  10 mg Oral Daily  . furosemide  40 mg Oral Daily  . levothyroxine  100 mcg Oral Q0600  . losartan  50 mg Oral Daily  . pantoprazole  40 mg Oral Daily  . potassium chloride  10 mEq Oral Daily  . rOPINIRole  0.25 mg Oral BID   Continuous Infusions: PRN Meds: acetaminophen **OR** acetaminophen, bisacodyl, ipratropium-albuterol, [DISCONTINUED] ondansetron **OR** ondansetron (ZOFRAN) IV, temazepam, traMADol     Vital Signs    Vitals:   10/02/18 2336 10/03/18 0857 10/03/18 0858 10/03/18 0900  BP: 137/66   (!) 135/48  Pulse: 65  (!) 107 64  Resp: 18  16   Temp: 98.2 F (36.8 C)  98.5 F (36.9 C)   TempSrc: Oral     SpO2: 95% 96% 100% 100%  Weight:      Height:        Intake/Output Summary (Last 24 hours) at 10/03/2018 1627 Last data filed at 10/03/2018 4562 Gross per 24 hour  Intake 240 ml  Output 700 ml  Net -460 ml   Filed Weights   09/26/18 1207 09/26/18 2246  Weight: 59.9 kg 62.2 kg    Physical Exam   Physical Exam: Blood pressure (!) 135/48, pulse 64, temperature 98.5 F (36.9 C), resp. rate 16, height 5' (1.524 m), weight 62.2 kg, SpO2 100 %.  On examination : alert oriented, no JVD, lungs clear to auscultation bilaterally, heart sounds regular normal S1-S2 no murmurs appreciated, abdomen soft nontender no significant lower extremity edema.  Musculoskeletal exam with good range of motion, neurologic exam grossly nonfocal, ecchymosis forehead, around the eyes  bilaterally, chin, right shoulder, left knee   Labs    Chemistry Recent Labs  Lab 09/27/18 0513  09/30/18 0600 10/01/18 0444 10/02/18 1556  NA 137   < > 136 135 135  K 3.8   < > 3.9 3.8 4.1  CL 105   < > 106 104 102  CO2 23   < > 26 25 25   GLUCOSE 113*   < > 98 95 102*  BUN 36*   < > 35* 33* 32*  CREATININE 1.54*   < > 1.46* 1.33* 1.54*  CALCIUM 8.4*   < > 8.1* 8.1* 7.9*  PROT 5.9*  --   --   --   --   ALBUMIN 2.9*  --   --   --   --   AST 52*  --   --   --   --   ALT 28  --   --   --   --   ALKPHOS 71  --   --   --   --   BILITOT 2.2*  --   --   --   --   GFRNONAA 30*   < > 32* 36* 30*  GFRAA 35*   < > 37* 42* 35*  ANIONGAP 9   < > 4* 6 8   < > =  values in this interval not displayed.     Hematology Recent Labs  Lab 09/28/18 0240 10/01/18 0444 10/02/18 1556  WBC 9.4 6.6 6.7  RBC 2.95* 3.13* 3.17*  HGB 8.1* 8.7* 8.9*  HCT 27.6* 28.1* 29.1*  MCV 93.6 89.8 91.8  MCH 27.5 27.8 28.1  MCHC 29.3* 31.0 30.6  RDW 16.8* 17.5* 17.9*  PLT 203 250 256    Cardiac Enzymes Recent Labs  Lab 09/27/18 0513  TROPONINI 0.05*      Radiology    No results found.  Telemetry    Sinus rhythm, HR high 50s to 80s, intermittent premature ventricular contractions.  Cardiac Studies   2D Echocardiogram1.17.2020  Study Conclusions  - Left ventricle: The cavity size was normal. There was mild concentric hypertrophy. Systolic function was normal. The estimated ejection fraction was in the range of 55% to 60%. Wall motion was normal; there were no regional wall motion abnormalities. The study is not technically sufficient to allow evaluation of LV diastolic function. - Mitral valve: There was mild regurgitation. - Left atrium: The atrium was moderately dilated. - Right ventricle: Systolic function was normal. - Pulmonary arteries: Systolic pressure was within the normal range.  Impressions:  - Rhythm is atrial fibrillation. _____________  Patient  Profile     85 y.o. female with history of paroxysmal atrial fibrillation, chronic diastolic congestive heart failure, stage IV chronic kidney disease, asthma/COPD, hypertension, hypothyroidism, anemia of chronic disease, and Barrett's esophagus, who was admitted 2/19 following fall with finding of bradycardia.  Assessment & Plan    Sinus bradycardia with history of recent falls:   prolonged time on the floor on arrival,.  Bradycardia improved with dopamine 24 hours  Diltiazem has been held -High risk of recurrent arrhythmia If she does develop atrial fibrillation and is relatively asymptomatic, potentially could proceed with rate control rather than rhythm control -Otherwise may need pacemaker to tolerate rate and antiarrhythmic medications Currently stable  PAF on anticoagulation with recent falls:   - S/p 2/7 DCCV d/t persistent Afib with RVR and new onset symptoms. Maintaining sinus rhythm this admission.   Eliquis 2.5mg  BID High fall risk  Anemia with known CKDIV CT without acute abnormality s/p fall.  Number stable  HFpEF:  - 08/24/2018 EF 55-60%.  Continue lasix and losartan.  Potassium supplement  Hypokalemia - Replete with goal 4.0. Consider adding home KCl supplementation at discharge   AKI on CKD IV:  - Cr 1.53  1.33 in setting of Rhabdo with elevated CK on admission of 861 in setting of prolonged time on the floor ~ 6h.  HTN:    Continue current medications, well controlled   Total encounter time more than 25 minutes  Greater than 50% was spent in counseling and coordination of care with the patient   Signed, Ida Rogue, MD 10/03/2018, 4:27 PM Pager [phone]

## 2018-10-03 NOTE — Progress Notes (Signed)
Physical Therapy Treatment Patient Details Name: Savannah Wilson MRN: [id number] DOB: 03-Oct-1928 Today's Date: 10/03/2018    History of Present Illness Pt is an 85 year old female admitted with rhabdomyolysis and bradycardia following a fall in her home.  Pt was down in her floor for 5 hrs before she was found and has been found to have C5/6 fracture.  PMH includes CKD, depression, atrial fibrillation, Htn, HLD.    PT Comments    Participated in exercises as described below.  Pt to edge of bed with max a x 2 with awareness not to pull on neck.  Sitting with min a x 1 due to difficulty maintaining sitting balance.  Declined to try walker for transfer and max a x 2 pivot to chair used to assist pt out of bed.    Pt again apologized for being fearful.  Encouragement given.  Pt with cervical collar on counter.  Pt stated she did not need to wear it.  Per neuro note it was recommended to wear for comfort.  She stated it hurts her and did not want to wear it.   Follow Up Recommendations  SNF     Equipment Recommendations  Rolling walker with 5" wheels    Recommendations for Other Services       Precautions / Restrictions Precautions Precautions: Fall Restrictions Weight Bearing Restrictions: No    Mobility  Bed Mobility Overal bed mobility: Needs Assistance Bed Mobility: Supine to Sit     Supine to sit: Max assist;+2 for physical assistance        Transfers Overall transfer level: Needs assistance Equipment used: None Transfers: Sit to/from [facility] Sit to Stand: +2 physical assistance;Total assist Stand pivot transfers: +2 physical assistance;Total assist       General transfer comment: less fearful today  Ambulation/Gait             General Gait Details: patient unable to ambulate at this time   Stairs             Wheelchair Mobility    Modified Rankin (Stroke Patients Only)       Balance Overall balance assessment: Needs  assistance Sitting-balance support: No upper extremity supported;Feet supported Sitting balance-Leahy Scale: Fair     Standing balance support: Bilateral upper extremity supported Standing balance-Leahy Scale: Poor Standing balance comment: requires max assist +2 for standing balance                            Cognition Arousal/Alertness: Awake/alert Behavior During Therapy: WFL for tasks assessed/performed Overall Cognitive Status: Within Functional Limits for tasks assessed                                        Exercises Other Exercises Other Exercises: supine a/aarom for ankle pumps, heel slides, ab/add, slr x 10 BLE    General Comments        Pertinent Vitals/Pain Pain Assessment: Faces Faces Pain Scale: Hurts a little bit Pain Location: right knee Pain Descriptors / Indicators: Aching;Discomfort;Sore Pain Intervention(s): Limited activity within patient's tolerance;Monitored during session;Repositioned    Home Living                      Prior Function            PT Goals (current goals can now be  found in the care plan section) Progress towards PT goals: Progressing toward goals    Frequency    Min 2X/week      PT Plan Current plan remains appropriate    Co-evaluation              AM-PAC PT "6 Clicks" Mobility   Outcome Measure  Help needed turning from your back to your side while in a flat bed without using bedrails?: A Lot Help needed moving from lying on your back to sitting on the side of a flat bed without using bedrails?: A Lot Help needed moving to and from a bed to a chair (including a wheelchair)?: Total Help needed standing up from a chair using your arms (e.g., wheelchair or bedside chair)?: Total Help needed to walk in hospital room?: Total Help needed climbing 3-5 steps with a railing? : Total 6 Click Score: 8    End of Session Equipment Utilized During Treatment: Gait belt Activity  Tolerance: Patient limited by fatigue;Other (comment) Patient left: in chair;with call bell/phone within reach;with chair alarm set Nurse Communication: Mobility status       Time: 1010-1027 PT Time Calculation (min) (ACUTE ONLY): 17 min  Charges:  $Therapeutic Exercise: 8-22 mins                    Chesley Noon, PTA 10/03/18, 10:37 AM

## 2018-10-03 NOTE — Discharge Summary (Signed)
[facility] [place] NAME: Savannah Wilson    MR#:  [id number]  DATE OF BIRTH:  09/03/1928  DATE OF ADMISSION:  09/26/2018   ADMITTING PHYSICIAN: [name], MD  DATE OF DISCHARGE: 10/03/2018  PRIMARY CARE PHYSICIAN: [name], MD   ADMISSION DIAGNOSIS:  Closed fracture of nasal bone, initial encounter [S02.2XXA] Closed fracture of spinous process of cervical vertebra, initial encounter ([place]) [S12.9XXA] Traumatic rhabdomyolysis, initial encounter ([place]) [T79.6XXA] Fall [W19.XXXA] DISCHARGE DIAGNOSIS:  Active Problems:   Rhabdomyolysis   Fall   Persistent atrial fibrillation   Chronic heart failure with preserved ejection fraction (HFpEF) (HCC)   Paroxysmal atrial fibrillation ([place])  SECONDARY DIAGNOSIS:   Past Medical History:  Diagnosis Date  . (HFpEF) heart failure with preserved ejection fraction ([place])    a. 08/2018 Echo: EF 55-60%, no rwma, Mild MR, mod dil LA. Nl RV fxn.  . Asthma   . Barrett esophagus   . Chronic bronchitis   . Chronic kidney disease (CKD), stage IV (severe) ([place])   . Depression   . Diverticulosis   . GERD (gastroesophageal reflux disease)   . Gout   . History of stress test    a. 04/2017 MV: prior inf/inflat infarct w/ peri-infarct ischemia. EF 55-65%. Sev hypotension w/ lexiscan.  . Hyperlipidemia   . Hypertension   . Hypothyroid   . Mitral regurgitation    a. TTE 02/2017: EF 55-60%, normal WM, calcified mitral annulus with mod regurg, moderately dilated LA; b. 08/2018 Echo: Mild MR.  . Pancreatitis due to biliary obstruction March 2010   s/p ERCP sphincterotomy, cholecystectomy  . Peripheral neuralgia   . Persistent atrial fibrillation    a. diagnosed 01/24/17-> CHADS2VASc => 5 (HTN, age x 2, vascular disease with PAD and aortic plaque, female) giving her an estimated annual stroke risk of 6.7%->Eliquis; b. successful DCCV 03/28/2017; c. BB d/c'd 2/2 bradycardia; d. 08/2018 Recurrent Afib-> DCCV->  dilt started.  . Venous insufficiency    HOSPITAL COURSE:   Chief complaint; fall  History of presenting complaint; Savannah Wilson  is a 85 y.o. female with a known history of chronic kidney disease stage IV, depression, atrial fibrillation on Eliquis hypertension hyperlipidemia hypothyroidism, GERD and multiple other medical problems is presenting to the ED after she sustained a fall. Patient sustained a fall mechanically while she was trying to get out of the bed and putting on her robe to go to the bathroom and she was just laying on the floor until daughter came and found her on the floor at around 11 AM.  Patient is brought into the ED.  CT head is negative but CT of the cervical spine has revealed C5-C6 spinous process fracture.  As per my discussion with neurosurgery as well as the ED physicians discussion no surgical interventions needed.  CT hip is also negative .  Please refer to the H&P dictated for further details.  Hospital course;  1. Sinus bradycardia, hypotension: Resolved.offpressors (dopamine). Off Diltiazem.  Heart rate remains controlled with most recent heart rate of 75.  2.  Paroxysmal atrial fibrillation.  Controlled. Frequent ectopy likely recurrent paroxysmal atrial fibrillation noted on telemetry.  Appreciate cardiologist input.  Cardizem discontinued recently due to bradycardia. Avoiding adding antiarrhythmics at this time.  Patient will require outpatient EP follow-up and consideration for PPM in the setting of tachybradycardia syndrome.  Patient agreeable with plan.  Patient already on anticoagulation with Eliquis.  Appointment made to follow-up with cardiologist  for outpatient referral for electrophysiology evaluation.  3. Rhabdomyolysis s/p mechanical fall: monitor CK- improved with IVFs  4. HFpEF: Euvolemic. HR/BP stable.Off pressors.  5. CKD IV: Creat stable.  6. AcuteC5-6 spinous process fx: Conservative mgmt per neurosurgery Physical therapy  working with patient.  Plans for rehab placement if approved by insurance company.  Pain.   Patient strength gradually improving.  Moving both upper extremities better.  Strengthening right lower extremity improving.  Physical therapy working with patient.  Patient was functional and living alone prior to this admission.  Continues to show improvement gradually.  I strongly believe patient will benefit from rehab to be able to return to prior level of functioning.  Patient continues to work with physical therapy with gradual improvement.   I did peer to peer review and patient has been approved for rehab placement for trial.  If patient however fails rehabilitation , recommendation will be for outpatient palliative care evaluation to readdress goal of care going forward especially given recurrent admissions to the hospital recently. Patient has had 2 CT scan of the head without contrast done during this admission with no evidence of CVA.  7. Hypothyroidism continue Synthyroid  8.  Hypomagnesemia; replaced  9.  Dysphagia.  Patient seen by speech therapist.  Recommended dysphagia 3 with thin liquids and current meds.  Meds with pure or crushed if needed for easier swallowing.  Full assistance with meal due to upper extremity weakness.  CODE STATUS: DNR   DISCHARGE CONDITIONS:  Stable CONSULTS OBTAINED:  Treatment Team:  Minna Merritts, MD DRUG ALLERGIES:   Allergies  Allergen Reactions  . Promethazine Anxiety, Palpitations and Other (See Comments)    IV Promethazine 6.25 mg. JIttery, anxious, couldn't lay still, increase HR  . Ace Inhibitors Cough  . Benicar [Olmesartan Medoxomil]     Unsure of reaction type  . Clarithromycin Other (See Comments)    Blisters in mouth  . Clarithromycin Other (See Comments)    Unknown   . Norvasc [Amlodipine Besylate]   . Levofloxacin Rash  . Zithromax [Azithromycin Dihydrate] Rash   DISCHARGE MEDICATIONS:   Allergies as of 10/03/2018       Reactions   Promethazine Anxiety, Palpitations, Other (See Comments)   IV Promethazine 6.25 mg. JIttery, anxious, couldn't lay still, increase HR   Ace Inhibitors Cough   Benicar [olmesartan Medoxomil]    Unsure of reaction type   Clarithromycin Other (See Comments)   Blisters in mouth   Clarithromycin Other (See Comments)   Unknown    Norvasc [amlodipine Besylate]    Levofloxacin Rash   Zithromax [azithromycin Dihydrate] Rash      Medication List    STOP taking these medications   ALPRAZolam 0.5 MG tablet Commonly known as:  XANAX   diltiazem 180 MG 24 hr capsule Commonly known as:  CARDIZEM CD   loperamide 2 MG capsule Commonly known as:  IMODIUM     TAKE these medications   acetaminophen 650 MG CR tablet Commonly known as:  TYLENOL Take 650 mg by mouth every 8 (eight) hours as needed for pain.   apixaban 2.5 MG Tabs tablet Commonly known as:  ELIQUIS Take 1 tablet (2.5 mg total) by mouth 2 (two) times daily.   benzonatate 200 MG capsule Commonly known as:  TESSALON Take 1 capsule (200 mg total) by mouth 3 (three) times daily as needed for cough.   bisacodyl 5 MG EC tablet Commonly known as:  DULCOLAX Take 1 tablet (5 mg total) by  mouth daily as needed for moderate constipation.   budesonide 0.25 MG/2ML nebulizer solution Commonly known as:  PULMICORT USE ONE (1) VIAL IN NEBULIZER TWO TIMES PER DAY   budesonide 3 MG 24 hr capsule Commonly known as:  ENTOCORT EC Take 1 capsule (3 mg total) by mouth daily.   docusate sodium 100 MG capsule Commonly known as:  COLACE Take 1 capsule (100 mg total) by mouth 2 (two) times daily as needed for mild constipation.   escitalopram 10 MG tablet Commonly known as:  LEXAPRO Take 1 tablet (10 mg total) by mouth daily. After dinner   fluticasone 50 MCG/ACT nasal spray Commonly known as:  FLONASE Place 2 sprays into the nose daily. What changed:    when to take this  reasons to take this   furosemide 40 MG  tablet Commonly known as:  LASIX Take 40 mg by mouth daily.   ipratropium-albuterol 0.5-2.5 (3) MG/3ML Soln Commonly known as:  DUONEB Take 3 mLs by nebulization every 6 (six) hours as needed.   levothyroxine 100 MCG tablet Commonly known as:  SYNTHROID, LEVOTHROID TAKE 1 TABLET BY MOUTH DAILY FOR THYROID   losartan 50 MG tablet Commonly known as:  COZAAR TAKE ONE TABLET EVERY DAY   mupirocin ointment 2 % Commonly known as:  BACTROBAN Place 1 application into the nose 2 (two) times daily.   omeprazole 40 MG capsule Commonly known as:  PRILOSEC TAKE ONE CAPSULE BY MOUTH TWICE A DAY. BEFORE BREAKFAST AND SUPPER   ondansetron 4 MG disintegrating tablet Commonly known as:  ZOFRAN ODT Take 1 tablet (4 mg total) by mouth every 8 (eight) hours as needed for nausea or vomiting.   OPTICHAMBER ADVANTAGE Misc 1 each by Other route once. Always uses her when you're using a metered-dose inhaler. You've aromatase medicine as much, he won't have his much side effect, but you it twice as much medicine and your lungs.   potassium chloride 10 MEQ tablet Commonly known as:  K-DUR,KLOR-CON Take 1 tablet (10 mEq total) by mouth daily. Start taking on:  October 04, 2018   PROAIR HFA 108 (90 Base) MCG/ACT inhaler Generic drug:  albuterol INHALE 2 PUFFS EVERY 6 HOURS AS NEEDED FOR WHEEZING   rOPINIRole 0.25 MG tablet Commonly known as:  REQUIP TAKE ONE TABLET 3 TIMES DAILY   temazepam 15 MG capsule Commonly known as:  RESTORIL TAKE 1 CAPSULE BY MOUTH AT BEDTIME AS NEEDED FOR SLEEP   traMADol 50 MG tablet Commonly known as:  ULTRAM Take 1 tablet (50 mg total) by mouth every 6 (six) hours as needed for moderate pain or severe pain.   vitamin B-12 1000 MCG tablet Commonly known as:  CYANOCOBALAMIN Take 1,000 mcg by mouth daily.   Vitamin D-3 25 MCG (1000 UT) Caps Take 1,000 Units by mouth daily.   ZYRTEC ALLERGY 10 MG tablet Generic drug:  cetirizine Take 10 mg by mouth daily.         DISCHARGE INSTRUCTIONS:   DIET:  Cardiac diet DISCHARGE CONDITION:  Stable ACTIVITY:  Out of bed with assistance OXYGEN:  Home Oxygen: No.  Oxygen Delivery: room air DISCHARGE LOCATION:  nursing home   If you experience worsening of your admission symptoms, develop shortness of breath, life threatening emergency, suicidal or homicidal thoughts you must seek medical attention immediately by calling 911 or calling your MD immediately  if symptoms less severe.  You Must read complete instructions/literature along with all the possible adverse reactions/side effects for all the  Medicines you take and that have been prescribed to you. Take any new Medicines after you have completely understood and accpet all the possible adverse reactions/side effects.   Please note  You were cared for by a hospitalist during your hospital stay. If you have any questions about your discharge medications or the care you received while you were in the hospital after you are discharged, you can call the unit and asked to speak with the hospitalist on call if the hospitalist that took care of you is not available. Once you are discharged, your primary care physician will handle any further medical issues. Please note that NO REFILLS for any discharge medications will be authorized once you are discharged, as it is imperative that you return to your primary care physician (or establish a relationship with a primary care physician if you do not have one) for your aftercare needs so that they can reassess your need for medications and monitor your lab values.    On the day of Discharge:  VITAL SIGNS:  Blood pressure (!) 135/48, pulse 64, temperature 98.5 F (36.9 C), resp. rate 16, height 5' (1.524 m), weight 62.2 kg, SpO2 100 %. PHYSICAL EXAMINATION:   HENT:  Head: Normocephalicand atraumatic.  Eyes:  Conjunctiva/sclera: Conjunctivae normal.  Pupils: Pupils are equal, round, and reactive  to light.  Comments: Facial bruising and ecchymosis around eyes Neck:  Musculoskeletal: Normal range of motionand neck supple.  Thyroid: No thyromegaly.  Trachea: No tracheal deviation.  Cardiovascular:  Rate and Rhythm: Normal rateand regular rhythm.  Heart sounds: Normal heart sounds.  Pulmonary:  Effort: Pulmonary effort is normal. Norespiratory distress.  Breath sounds: Normal breath sounds. Nowheezing.  Chest:  Chest wall: No tenderness.  Abdominal:  General: Bowel sounds are normal. There is nodistension.  Palpations: Abdomen is soft.  Tenderness: There is no abdominal tenderness.  Musculoskeletal:Patient with generalized weakness but progressively improving.  Decreased strength more on the right lower extremity but gradually improving. Skin: General: Skin is warmand dry.  Findings: No rash.  Neurological:  Mental Status: She is alertand oriented to person, place, and time.    Generalized weakness more on the right side. DATA REVIEW:   CBC Recent Labs  Lab 10/02/18 1556  WBC 6.7  HGB 8.9*  HCT 29.1*  PLT 256    Chemistries  Recent Labs  Lab 09/27/18 0513  10/01/18 0444 10/02/18 1556  NA 137   < > 135 135  K 3.8   < > 3.8 4.1  CL 105   < > 104 102  CO2 23   < > 25 25  GLUCOSE 113*   < > 95 102*  BUN 36*   < > 33* 32*  CREATININE 1.54*   < > 1.33* 1.54*  CALCIUM 8.4*   < > 8.1* 7.9*  MG  --    < > 2.0  --   AST 52*  --   --   --   ALT 28  --   --   --   ALKPHOS 71  --   --   --   BILITOT 2.2*  --   --   --    < > = values in this interval not displayed.     Microbiology Results  Results for orders placed or performed during the hospital encounter of 09/26/18  MRSA PCR Screening     Status: None   Collection Time: 09/26/18 10:42 PM  Result Value Ref Range Status   MRSA  by PCR NEGATIVE NEGATIVE Final    Comment:        The GeneXpert MRSA Assay (FDA approved for NASAL specimens only), is one  component of a comprehensive MRSA colonization surveillance program. It is not intended to diagnose MRSA infection nor to guide or monitor treatment for MRSA infections. Performed at [facility], [address], [place], [place] [address]     RADIOLOGY:  No results found.   Management plans discussed with the patient, family and they are in agreement.  CODE STATUS: DNR   TOTAL TIME TAKING CARE OF THIS PATIENT: 38 minutes.    Medha Pippen M.D on 10/03/2018 at 10:21 AM  Between 7am to 6pm - Pager - [phone]  After 6pm go to [email or web address] - Proofreader  [facility]  Office  [phone]  CC: Primary care physician; [name], MD   Note: This dictation was prepared with Dragon dictation along with smaller phrase technology. Any transcriptional errors that result from this process are unintentional.

## 2018-10-03 NOTE — Progress Notes (Signed)
Report called to [facility] at [facility]. EMS called for transport. All belongings packed. VSS. PIVs removed.  Bethann Punches, RN

## 2018-10-03 NOTE — Clinical Social Work Note (Signed)
Patient is medically ready for discharge today. CSW notified patient's daughter Knute Neu [phone]. Daughter is in agreement with discharge to [facility] today. CSW notified Magda Paganini at [facility] of discharge today as well. Patient will be transported by EMS. RN to call report and call for transport.   [place], [place]

## 2018-10-03 NOTE — Progress Notes (Addendum)
SLP Progress Note  Patient Details Name: Savannah Wilson MRN: [id number] DOB: 1929/02/27   Cancelled treatment:       Reason Eval/Treat Not Completed: SLP screened, no needs identified, will sign off. Chart reviewed; NSG consulted. Pt was reclined in bed upon ST entry - shared that Dtr was present at breakfast time. Pt reported that she did not have any throat clearing, coughing, choking while eating. Pt shared that she is being discharged today to [facility] re-educated pt re: general aspiration precautions to carry-over to next facility. Pt agreed.   Skilled ST services to sign-off at this time. NSG to re-consult if any decline in status while admitted. NSG consulted and agreed.    Emeline General, Graduate Student SLP 10/03/2018, 9:22 AM

## 2018-10-04 ENCOUNTER — Ambulatory Visit: Payer: PPO | Admitting: Internal Medicine

## 2018-10-04 DIAGNOSIS — N184 Chronic kidney disease, stage 4 (severe): Secondary | ICD-10-CM | POA: Diagnosis not present

## 2018-10-04 DIAGNOSIS — I5032 Chronic diastolic (congestive) heart failure: Secondary | ICD-10-CM | POA: Diagnosis not present

## 2018-10-04 DIAGNOSIS — R1310 Dysphagia, unspecified: Secondary | ICD-10-CM | POA: Diagnosis not present

## 2018-10-04 DIAGNOSIS — R296 Repeated falls: Secondary | ICD-10-CM | POA: Diagnosis not present

## 2018-10-04 DIAGNOSIS — I4891 Unspecified atrial fibrillation: Secondary | ICD-10-CM | POA: Diagnosis not present

## 2018-10-21 NOTE — Progress Notes (Deleted)
Cardiology Office Note Date:  10/21/2018  Patient ID:  Savannah, Wilson 1928/10/03, MRN [id number] PCP:  Savannah Mc, MD  Cardiologist:  Dr. Fletcher Anon, MD  ***refresh   Chief Complaint: Hospital follow up  History of Present Illness: Savannah Wilson is a 85 y.o. female with history of ***   Past Medical History:  Diagnosis Date  . (HFpEF) heart failure with preserved ejection fraction ([place])    a. 08/2018 Echo: EF 55-60%, no rwma, Mild MR, mod dil LA. Nl RV fxn.  . Asthma   . Barrett esophagus   . Chronic bronchitis   . Chronic kidney disease (CKD), stage IV (severe) ([place])   . Depression   . Diverticulosis   . GERD (gastroesophageal reflux disease)   . Gout   . History of stress test    a. 04/2017 MV: prior inf/inflat infarct w/ peri-infarct ischemia. EF 55-65%. Sev hypotension w/ lexiscan.  . Hyperlipidemia   . Hypertension   . Hypothyroid   . Mitral regurgitation    a. TTE 02/2017: EF 55-60%, normal WM, calcified mitral annulus with mod regurg, moderately dilated LA; b. 08/2018 Echo: Mild MR.  . Pancreatitis due to biliary obstruction March 2010   s/p ERCP sphincterotomy, cholecystectomy  . Peripheral neuralgia   . Persistent atrial fibrillation    a. diagnosed 01/24/17-> CHADS2VASc => 5 (HTN, age x 2, vascular disease with PAD and aortic plaque, female) giving her an estimated annual stroke risk of 6.7%->Eliquis; b. successful DCCV 03/28/2017; c. BB d/c'd 2/2 bradycardia; d. 08/2018 Recurrent Afib-> DCCV-> dilt started.  . Venous insufficiency     Past Surgical History:  Procedure Laterality Date  . ABDOMINAL HYSTERECTOMY    . CARDIOVERSION N/A 03/28/2017   Procedure: CARDIOVERSION;  Surgeon: Nelva Bush, MD;  Location: [facility];  Service: Cardiovascular;  Laterality: N/A;  . CARDIOVERSION N/A 09/14/2018   Procedure: CARDIOVERSION;  Surgeon: Minna Merritts, MD;  Location: [facility];  Service: Cardiovascular;  Laterality: N/A;  . CARPAL TUNNEL RELEASE  jan 2013   Savannah Wilson  . CATARACT EXTRACTION     right  . CATARACT EXTRACTION  2008  . CHOLECYSTECTOMY  02/10  . CHOLECYSTECTOMY  2010  . TONSILLECTOMY    . VENTRAL HERNIA REPAIR    . VENTRAL HERNIA REPAIR  2007  . VESICOVAGINAL FISTULA CLOSURE W/ TAH      No outpatient medications have been marked as taking for the 11/01/18 encounter (Appointment) with Savannah Mu, PA-C.    Allergies:   Promethazine; Ace inhibitors; Benicar [olmesartan medoxomil]; Clarithromycin; Clarithromycin; Norvasc [amlodipine besylate]; Levofloxacin; and Zithromax [azithromycin dihydrate]   Social History:  The patient  reports that she has never smoked. She has never used smokeless tobacco. She reports that she does not drink alcohol or use drugs.   Family History:  The patient's family history includes Cancer in her daughter; Heart disease in her father; Hypertension in her mother; Kidney cancer in her father; Kidney disease in her daughter and mother; Kidney failure in her mother; Multiple myeloma in her daughter; Rheumatologic disease in her father.  ROS:   ROS   PHYSICAL EXAM: *** VS:  There were no vitals taken for this visit. BMI: There is no height or weight on file to calculate BMI.  Physical Exam   EKG:  Was ordered and interpreted by me today. Shows ***  Recent Labs: 09/27/2018: ALT 28; TSH 0.148 10/01/2018: Magnesium 2.0 10/02/2018: BUN 32; Creatinine, Ser 1.54; Hemoglobin 8.9; Platelets 256; Potassium  4.1; Sodium 135  08/24/2018: Cholesterol 84; HDL 27; LDL Cholesterol 45; Total CHOL/HDL Ratio 3.1; Triglycerides 60; VLDL 12   CrCl cannot be calculated (Unknown ideal weight.).   Wt Readings from Last 3 Encounters:  09/26/18 137 lb 2 oz (62.2 kg)  09/18/18 132 lb (59.9 kg)  09/15/18 134 lb 12.8 oz (61.1 kg)     Other studies reviewed: Additional studies/records reviewed today include: summarized above  ASSESSMENT AND PLAN:  1. ***  Disposition: F/u with Dr. Fletcher Savannah Wilson or an APP in ***.  Current  medicines are reviewed at length with the patient today.  The patient did not have any concerns regarding medicines.  Signed, Christell Faith, PA-C 10/21/2018 2:04 PM     Savannah Wilson Savannah Wilson Savannah Wilson, Savannah Wilson [phone]

## 2018-10-23 ENCOUNTER — Other Ambulatory Visit: Payer: Self-pay | Admitting: *Deleted

## 2018-10-23 NOTE — Patient Outreach (Signed)
[facility]) Care Management  10/23/2018  Savannah Wilson July 18, 1929 [id number]   Per PatientPing this patient admitted to skilled facility, noted that they are a [facility] patient.  This patient will be followed by [facility] in the community. [facility] will provide full case management services.  [facility] care management services are not appropriate at this time.   Plan to sign off. Will collaborate with [facility] as indicated.   Royetta Crochet. Laymond Purser, MSN, RN, [employer] , [place] [phone]) Business Cell  [phone]) Toll Free Office

## 2018-10-26 DIAGNOSIS — M6281 Muscle weakness (generalized): Secondary | ICD-10-CM | POA: Diagnosis not present

## 2018-10-26 DIAGNOSIS — R1311 Dysphagia, oral phase: Secondary | ICD-10-CM | POA: Diagnosis not present

## 2018-10-26 DIAGNOSIS — R262 Difficulty in walking, not elsewhere classified: Secondary | ICD-10-CM | POA: Diagnosis not present

## 2018-10-29 ENCOUNTER — Telehealth: Payer: Self-pay | Admitting: Cardiovascular Disease

## 2018-10-29 NOTE — Telephone Encounter (Signed)
   Primary Cardiologist:  Kathlyn Sacramento, MD   Patient contacted.  History reviewed.  No symptoms to suggest any unstable cardiac conditions.  Based on discussion, with current pandemic situation, we will be postponing this appointment for @PATIENTNAME @.  If symptoms change, she has been instructed to contact our office.   Routing to C19 CANCEL pool for tracking (P CV DIV CV19 CANCEL) and assigning priority (1 = 4-6 wks, 2 = 6-12 wks, 3 = >12 wks).  Ricci Barker, RN  10/29/2018 11:53 AM    Spoke with the daughter. She stated that on Thursday her mother was moved to a long term care. The daughter is unable to visit the paitent due to lock down. She stated that she was told by the facility that the on site physician will be taking care of the patient for the time being. The daughter has been advised to call the office if there is anything we can do for them.      Marland Kitchen

## 2018-10-29 NOTE — Telephone Encounter (Signed)
Patient daughter calling to let us know she is at [facility] and is under lock down.  Patient also unable to ambulate and cannot come to her appt.    Daughter states patietn is unable to use mychart for evisit herself and she cannot assist as the snf is on lockdown.  Please advise.

## 2018-10-31 DIAGNOSIS — N390 Urinary tract infection, site not specified: Secondary | ICD-10-CM | POA: Diagnosis not present

## 2018-10-31 DIAGNOSIS — Z79899 Other long term (current) drug therapy: Secondary | ICD-10-CM | POA: Diagnosis not present

## 2018-10-31 DIAGNOSIS — R319 Hematuria, unspecified: Secondary | ICD-10-CM | POA: Diagnosis not present

## 2018-11-01 ENCOUNTER — Ambulatory Visit: Payer: PPO | Admitting: Physician Assistant

## 2018-11-01 ENCOUNTER — Other Ambulatory Visit: Payer: Self-pay | Admitting: *Deleted

## 2018-11-01 DIAGNOSIS — D649 Anemia, unspecified: Secondary | ICD-10-CM | POA: Diagnosis not present

## 2018-11-01 DIAGNOSIS — N390 Urinary tract infection, site not specified: Secondary | ICD-10-CM | POA: Diagnosis not present

## 2018-11-01 NOTE — Patient Outreach (Signed)
[facility]) Care Management  11/01/2018  MIQUELA COSTABILE 04/25/1929 [id number]   Collaboration with Jonelle Sidle, SW at [facility], she reports that patient is actually LTC at facility and will remain after skilled nursing completed. No [facility] care management needs identified.  Plan to collaborate with [facility] with new information and sign off. Royetta Crochet. Laymond Purser, MSN, RN, [employer] , [place] [phone]) Business Cell  [phone]) Toll Free Office

## 2018-11-05 ENCOUNTER — Telehealth: Payer: Self-pay | Admitting: Cardiovascular Disease

## 2018-11-05 NOTE — Telephone Encounter (Signed)
Virtual Visit Pre-Appointment Phone Call  Steps For Call:  1. Confirm consent - "In the setting of the current Covid19 crisis, you are scheduled for a (phone or video) visit with your provider on (date) at (time).  Just as we do with many in-office visits, in order for you to participate in this visit, we must obtain consent.  If you'd like, I can send this to your mychart (if signed up) or email for you to review.  Otherwise, I can obtain your verbal consent now.  All virtual visits are billed to your insurance company just like a normal visit would be.  By agreeing to a virtual visit, we'd like you to understand that the technology does not allow for your provider to perform an examination, and thus may limit your provider's ability to fully assess your condition.  Finally, though the technology is pretty good, we cannot assure that it will always work on either your or our end, and in the setting of a video visit, we may have to convert it to a phone-only visit.  In either situation, we cannot ensure that we have a secure connection.  Are you willing to proceed?"  2. Give patient instructions for WebEx download to smartphone as below if video visit  3. Advise patient to be prepared with any vital sign or heart rhythm information, their current medicines, and a piece of paper and pen handy for any instructions they may receive the day of their visit  4. Inform patient they will receive a phone call 15 minutes prior to their appointment time (may be from unknown caller ID) so they should be prepared to answer  5. Confirm that appointment type is correct in Epic appointment notes (video vs telephone)    TELEPHONE CALL NOTE  Savannah Wilson has been deemed a candidate for a follow-up tele-health visit to limit community exposure during the Covid-19 pandemic. I spoke with the patient via phone to ensure availability of phone/video source, confirm preferred email & phone number, and discuss  instructions and expectations.  I reminded Savannah Wilson to be prepared with any vital sign and/or heart rhythm information that could potentially be obtained via home monitoring, at the time of her visit. I reminded Savannah Wilson to expect a phone call at the time of her visit if her visit.  Did the patient verbally acknowledge consent to treatment? yes  Clarisse Gouge 11/05/2018 4:12 PM   DOWNLOADING THE [place]  - If Apple, go to [employer] and type in WebEx in the search bar. [place] [employer], the blue/green circle. The app is free but as with any other app downloads, their phone may require them to verify saved payment information or Apple password. The patient does NOT have to create an account.  - If Android, ask patient to go to [employer] and type in WebEx in the search bar. [place] [employer], the blue/green circle. The app is free but as with any other app downloads, their phone may require them to verify saved payment information or Android password. The patient does NOT have to create an account.   CONSENT FOR TELE-HEALTH VISIT - PLEASE REVIEW  I hereby voluntarily request, consent and authorize [facility] and its employed or contracted physicians, physician assistants, nurse practitioners or other licensed health care professionals (the Practitioner), to provide me with telemedicine health care services (the Services") as deemed necessary by the treating Practitioner. I  acknowledge and consent to receive the Services by the Practitioner via telemedicine. I understand that the telemedicine visit will involve communicating with the Practitioner through live audiovisual communication technology and the disclosure of certain medical information by electronic transmission. I acknowledge that I have been given the opportunity to request an in-person assessment or other available alternative prior to the telemedicine visit and am  voluntarily participating in the telemedicine visit.  I understand that I have the right to withhold or withdraw my consent to the use of telemedicine in the course of my care at any time, without affecting my right to future care or treatment, and that the Practitioner or I may terminate the telemedicine visit at any time. I understand that I have the right to inspect all information obtained and/or recorded in the course of the telemedicine visit and may receive copies of available information for a reasonable fee.  I understand that some of the potential risks of receiving the Services via telemedicine include:   Delay or interruption in medical evaluation due to technological equipment failure or disruption;  Information transmitted may not be sufficient (e.g. poor resolution of images) to allow for appropriate medical decision making by the Practitioner; and/or   In rare instances, security protocols could fail, causing a breach of personal health information.  Furthermore, I acknowledge that it is my responsibility to provide information about my medical history, conditions and care that is complete and accurate to the best of my ability. I acknowledge that Practitioner's advice, recommendations, and/or decision may be based on factors not within their control, such as incomplete or inaccurate data provided by me or distortions of diagnostic images or specimens that may result from electronic transmissions. I understand that the practice of medicine is not an exact science and that Practitioner makes no warranties or guarantees regarding treatment outcomes. I acknowledge that I will receive a copy of this consent concurrently upon execution via email to the email address I last provided but may also request a printed copy by calling the office of [facility].    I understand that my insurance will be billed for this visit.   I have read or had this consent read to me.  I understand the  contents of this consent, which adequately explains the benefits and risks of the Services being provided via telemedicine.   I have been provided ample opportunity to ask questions regarding this consent and the Services and have had my questions answered to my satisfaction.  I give my informed consent for the services to be provided through the use of telemedicine in my medical care  By participating in this telemedicine visit I agree to the above.

## 2018-11-07 DIAGNOSIS — M6281 Muscle weakness (generalized): Secondary | ICD-10-CM | POA: Diagnosis not present

## 2018-11-07 DIAGNOSIS — R262 Difficulty in walking, not elsewhere classified: Secondary | ICD-10-CM | POA: Diagnosis not present

## 2018-11-08 ENCOUNTER — Telehealth: Payer: Self-pay

## 2018-11-08 NOTE — Telephone Encounter (Signed)
Spoke with Claiborne Billings at [employer] [facility] [employer] for Ms. Dismuke's Video visit tomorrow.  Claiborne Billings will call back to go through the process.

## 2018-11-09 ENCOUNTER — Telehealth (INDEPENDENT_AMBULATORY_CARE_PROVIDER_SITE_OTHER): Payer: PPO | Admitting: Cardiovascular Disease

## 2018-11-09 ENCOUNTER — Telehealth: Payer: Self-pay | Admitting: Cardiovascular Disease

## 2018-11-09 ENCOUNTER — Encounter: Payer: Self-pay | Admitting: Cardiovascular Disease

## 2018-11-09 ENCOUNTER — Other Ambulatory Visit: Payer: Self-pay

## 2018-11-09 VITALS — BP 118/78 | HR 74 | Temp 97.6°F | Resp 20 | Ht 60.0 in | Wt 132.0 lb

## 2018-11-09 DIAGNOSIS — I4819 Other persistent atrial fibrillation: Secondary | ICD-10-CM | POA: Diagnosis not present

## 2018-11-09 DIAGNOSIS — I5032 Chronic diastolic (congestive) heart failure: Secondary | ICD-10-CM | POA: Diagnosis not present

## 2018-11-09 NOTE — Telephone Encounter (Signed)
Please call daughter Knute Neu,  to discuss evisit from yesterday. Daughter is on DPR and authorized to discuss information.

## 2018-11-09 NOTE — Telephone Encounter (Signed)
Call placed to the daughter. She has verbalized her understanding of the visit today for her mother.

## 2018-11-09 NOTE — Patient Instructions (Signed)
Medication Instructions:  Continue same medications If you need a refill on your cardiac medications before your next appointment, please call your pharmacy.   Lab work: None If you have labs (blood work) drawn today and your tests are completely normal, you will receive your results only by: Marland Kitchen MyChart Message (if you have MyChart) OR . A paper copy in the mail If you have any lab test that is abnormal or we need to change your treatment, we will call you to review the results.  Testing/Procedures: None  Follow-Up: At [facility], you and your health needs are our priority.  As part of our continuing mission to provide you with exceptional heart care, we have created designated Provider Care Teams.  These Care Teams include your primary Cardiologist (physician) and Advanced Practice Providers (APPs -  Physician Assistants and Nurse Practitioners) who all work together to provide you with the care you need, when you need it. You will need a follow up appointment in 1 months.  Please call our office 2 months in advance to schedule this appointment.  You may see Kathlyn Sacramento, MD or one of the following Advanced Practice Providers on your designated Care Team:   Murray Hodgkins, NP Christell Faith, PA-C . Marrianne Mood, PA-C

## 2018-11-09 NOTE — Progress Notes (Signed)
Virtual Visit via Video Note    Evaluation Performed:  Follow-up visit  This visit type was conducted due to national recommendations for restrictions regarding the COVID-19 Pandemic (e.g. social distancing).  This format is felt to be most appropriate for this patient at this time.  All issues noted in this document were discussed and addressed.  No physical exam was performed (except for noted visual exam findings with Video Visits).  Please refer to the patient's chart (MyChart message for video visits and phone note for telephone visits) for the patient's consent to telehealth for [facility].  Date:  11/09/2018   ID:  Savannah Wilson, DOB May 29, 1929, MRN [id number]  Patient Location:     Provider location:   [place]  PCP:  [name], MD  Cardiologist:  Kathlyn Sacramento, MD  Electrophysiologist:  None   Chief Complaint:  Follow up visit  History of Present Illness:    Savannah Wilson is a 85 y.o. female who presents via audio/video conferencing for a telehealth visit today.   She has multiple chronic medical conditions that include paroxysmal atrial fibrillation and chronic diastolic heart failure. She has known history of stage IV chronic kidney disease, asthma/COPD, essential hypertension, hypothyroidism, anemia of chronic disease and Barrett's esophagus. Atrial fibrillation has been associated with significant diastolic heart failure and she has required multiple cardioversions since 2018. She had nuclear stress testing in 2018 which showed evidence of prior inferior/inferolateral infarct with minimal peri-infarct ischemia. The patient had severe hypotension with Lexiscan.  She is being treated medically for presumed underlying coronary artery disease.  She had multiple hospitalizations recently due to GI symptoms thought to be due to diverticulosis in addition to atrial fibrillation complicated by heart failure.  She underwent successful cardioversion in February with  improvement in symptoms but she returned back after a fall complicated by cervical fracture with significant bradycardia noted.  Diltiazem was discontinued with improvement.  The patient was discharged to rehab.  She reports persistent weakness and inability to walk on her own.  She has no leg edema and she denies chest pain or significant dyspnea.  No palpitations.  She is very frustrated that she is not able to see her family.  The patient does not have symptoms concerning for COVID-19 infection (fever, chills, cough, or new shortness of breath).    Prior CV studies:   The following studies were reviewed today:    Past Medical History:  Diagnosis Date  . (HFpEF) heart failure with preserved ejection fraction ([place])    a. 08/2018 Echo: EF 55-60%, no rwma, Mild MR, mod dil LA. Nl RV fxn.  . Asthma   . Barrett esophagus   . Chronic bronchitis   . Chronic kidney disease (CKD), stage IV (severe) ([place])   . Depression   . Diverticulosis   . GERD (gastroesophageal reflux disease)   . Gout   . History of stress test    a. 04/2017 MV: prior inf/inflat infarct w/ peri-infarct ischemia. EF 55-65%. Sev hypotension w/ lexiscan.  . Hyperlipidemia   . Hypertension   . Hypothyroid   . Mitral regurgitation    a. TTE 02/2017: EF 55-60%, normal WM, calcified mitral annulus with mod regurg, moderately dilated LA; b. 08/2018 Echo: Mild MR.  . Pancreatitis due to biliary obstruction March 2010   s/p ERCP sphincterotomy, cholecystectomy  . Peripheral neuralgia   . Persistent atrial fibrillation    a. diagnosed 01/24/17-> CHADS2VASc => 5 (HTN, age x 2, vascular disease  with PAD and aortic plaque, female) giving her an estimated annual stroke risk of 6.7%->Eliquis; b. successful DCCV 03/28/2017; c. BB d/c'd 2/2 bradycardia; d. 08/2018 Recurrent Afib-> DCCV-> dilt started.  . Venous insufficiency    Past Surgical History:  Procedure Laterality Date  . ABDOMINAL HYSTERECTOMY    . CARDIOVERSION N/A 03/28/2017    Procedure: CARDIOVERSION;  Surgeon: Nelva Bush, MD;  Location: [facility];  Service: Cardiovascular;  Laterality: N/A;  . CARDIOVERSION N/A 09/14/2018   Procedure: CARDIOVERSION;  Surgeon: Minna Merritts, MD;  Location: [facility];  Service: Cardiovascular;  Laterality: N/A;  . CARPAL TUNNEL RELEASE  jan 2013   [name]  . CATARACT EXTRACTION     right  . CATARACT EXTRACTION  2008  . CHOLECYSTECTOMY  02/10  . CHOLECYSTECTOMY  2010  . TONSILLECTOMY    . VENTRAL HERNIA REPAIR    . VENTRAL HERNIA REPAIR  2007  . VESICOVAGINAL FISTULA CLOSURE W/ TAH       Current Meds  Medication Sig  . acetaminophen (TYLENOL) 650 MG CR tablet Take 650 mg by mouth every 8 (eight) hours as needed for pain.   Marland Kitchen apixaban (ELIQUIS) 2.5 MG TABS tablet Take 1 tablet (2.5 mg total) by mouth 2 (two) times daily.  . bisacodyl (DULCOLAX) 5 MG EC tablet Take 1 tablet (5 mg total) by mouth daily as needed for moderate constipation.  . budesonide (ENTOCORT EC) 3 MG 24 hr capsule Take 1 capsule (3 mg total) by mouth daily. (Patient taking differently: Take 3 mg by mouth 2 (two) times daily. )  . budesonide (PULMICORT) 0.25 MG/2ML nebulizer solution USE ONE (1) VIAL IN NEBULIZER TWO TIMES PER DAY  . Cholecalciferol (VITAMIN D-3) 1000 units CAPS Take 1,000 Units by mouth daily.   Marland Kitchen docusate sodium (COLACE) 100 MG capsule Take 1 capsule (100 mg total) by mouth 2 (two) times daily as needed for mild constipation.  Marland Kitchen escitalopram (LEXAPRO) 20 MG tablet Take 20 mg by mouth daily.   Marland Kitchen esomeprazole (NEXIUM) 40 MG capsule Take 40 mg by mouth daily at 12 noon.  . fluticasone (FLONASE) 50 MCG/ACT nasal spray Place 2 sprays into the nose daily. (Patient taking differently: Place 2 sprays into the nose daily as needed (For allergies.). )  . furosemide (LASIX) 40 MG tablet Take 40 mg by mouth daily.   Marland Kitchen ipratropium-albuterol (DUONEB) 0.5-2.5 (3) MG/3ML SOLN Take 3 mLs by nebulization every 6 (six) hours as needed.  Marland Kitchen  levothyroxine (SYNTHROID, LEVOTHROID) 112 MCG tablet Take 112 mcg by mouth daily.  Marland Kitchen losartan (COZAAR) 50 MG tablet TAKE ONE TABLET EVERY DAY  . mupirocin ointment (BACTROBAN) 2 % Place 1 application into the nose 2 (two) times daily.  . ondansetron (ZOFRAN ODT) 4 MG disintegrating tablet Take 1 tablet (4 mg total) by mouth every 8 (eight) hours as needed for nausea or vomiting.  . potassium chloride (K-DUR,KLOR-CON) 10 MEQ tablet Take 1 tablet (10 mEq total) by mouth daily.  Marland Kitchen PROAIR HFA 108 (90 BASE) MCG/ACT inhaler INHALE 2 PUFFS EVERY 6 HOURS AS NEEDED FOR WHEEZING  . rOPINIRole (REQUIP) 0.25 MG tablet TAKE ONE TABLET 3 TIMES DAILY  . Spacer/Aero-Holding Chambers (OPTICHAMBER ADVANTAGE) MISC 1 each by Other route once. Always uses her when you're using a metered-dose inhaler. You've aromatase medicine as much, he won't have his much side effect, but you it twice as much medicine and your lungs.  . temazepam (RESTORIL) 15 MG capsule TAKE 1 CAPSULE BY MOUTH AT BEDTIME  AS NEEDED FOR SLEEP  . vitamin B-12 (CYANOCOBALAMIN) 1000 MCG tablet Take 1,000 mcg by mouth daily.     Allergies:   Promethazine; Ace inhibitors; Benicar [olmesartan medoxomil]; Clarithromycin; Clarithromycin; Norvasc [amlodipine besylate]; Levofloxacin; and Zithromax [azithromycin dihydrate]   Social History   Tobacco Use  . Smoking status: Never Smoker  . Smokeless tobacco: Never Used  Substance Use Topics  . Alcohol use: No    Comment: rare  . Drug use: No     Family Hx: The patient's family history includes Cancer in her daughter; Heart disease in her father; Hypertension in her mother; Kidney cancer in her father; Kidney disease in her daughter and mother; Kidney failure in her mother; Multiple myeloma in her daughter; Rheumatologic disease in her father.  ROS:   Please see the history of present illness.     All other systems reviewed and are negative.   Labs/Other Tests and Data Reviewed:    Recent Labs:  09/27/2018: ALT 28; TSH 0.148 10/01/2018: Magnesium 2.0 10/02/2018: BUN 32; Creatinine, Ser 1.54; Hemoglobin 8.9; Platelets 256; Potassium 4.1; Sodium 135   Recent Lipid Panel Lab Results  Component Value Date/Time   CHOL 84 08/24/2018 04:38 AM   TRIG 60 08/24/2018 04:38 AM   HDL 27 (L) 08/24/2018 04:38 AM   CHOLHDL 3.1 08/24/2018 04:38 AM   LDLCALC 45 08/24/2018 04:38 AM    Wt Readings from Last 3 Encounters:  11/09/18 132 lb (59.9 kg)  09/26/18 137 lb 2 oz (62.2 kg)  09/18/18 132 lb (59.9 kg)     Objective:    Vital Signs:  BP 118/78   Pulse 74   Temp 97.6 F (36.4 C)   Resp 20   Ht 5' (1.524 m)   Wt 132 lb (59.9 kg)   BMI 25.78 kg/m    Frail looking female in no acute distress. No evidence of respiratory distress  ASSESSMENT & PLAN:    1.  Persistent atrial fibrillation: Status post successful cardioversion in February: Based on her heart rate readings, she appears to be in sinus rhythm.  She is no longer on diltiazem or a beta-blocker due to bradycardia.  I do think that she is at high risk for recurrent atrial fibrillation and we should consider adding amiodarone to try to keep her in sinus rhythm. Continue anticoagulation with low-dose Eliquis given her age and chronic kidney disease.  2.  Chronic diastolic heart failure: She reports resolution of leg edema and her weight is back to baseline.  She appears to be euvolemic on current dose of furosemide.  3.  Presumed underlying coronary artery disease: No anginal symptoms.  Continue medical therapy.  COVID-19 Education: The signs and symptoms of COVID-19 were discussed with the patient and how to seek care for testing (follow up with PCP or arrange E-visit).  The importance of social distancing was discussed today.  Patient Risk:   After full review of this patient's clinical status, I feel that they are at least moderate risk at this time.  Time:   Today, I have spent 25 minutes with the patient with telehealth  technology discussing above issues.     Medication Adjustments/Labs and Tests Ordered: Current medicines are reviewed at length with the patient today.  Concerns regarding medicines are outlined above.  Tests Ordered: No orders of the defined types were placed in this encounter.  Medication Changes: No orders of the defined types were placed in this encounter.   Disposition:  Follow up in 1 month(s)  Signed, Kathlyn Sacramento, MD  11/09/2018 10:01 AM    [place]

## 2018-11-20 DIAGNOSIS — D649 Anemia, unspecified: Secondary | ICD-10-CM | POA: Diagnosis not present

## 2018-11-20 DIAGNOSIS — Z79899 Other long term (current) drug therapy: Secondary | ICD-10-CM | POA: Diagnosis not present

## 2018-11-26 DIAGNOSIS — E039 Hypothyroidism, unspecified: Secondary | ICD-10-CM | POA: Diagnosis not present

## 2018-11-26 DIAGNOSIS — I10 Essential (primary) hypertension: Secondary | ICD-10-CM | POA: Diagnosis not present

## 2018-11-30 DIAGNOSIS — N184 Chronic kidney disease, stage 4 (severe): Secondary | ICD-10-CM | POA: Diagnosis not present

## 2018-11-30 DIAGNOSIS — I5032 Chronic diastolic (congestive) heart failure: Secondary | ICD-10-CM | POA: Diagnosis not present

## 2018-11-30 DIAGNOSIS — Z789 Other specified health status: Secondary | ICD-10-CM | POA: Diagnosis not present

## 2018-11-30 DIAGNOSIS — I4891 Unspecified atrial fibrillation: Secondary | ICD-10-CM | POA: Diagnosis not present

## 2018-11-30 DIAGNOSIS — Z0000 Encounter for general adult medical examination without abnormal findings: Secondary | ICD-10-CM | POA: Diagnosis not present

## 2018-11-30 DIAGNOSIS — R296 Repeated falls: Secondary | ICD-10-CM | POA: Diagnosis not present

## 2018-12-07 DIAGNOSIS — R262 Difficulty in walking, not elsewhere classified: Secondary | ICD-10-CM | POA: Diagnosis not present

## 2018-12-07 DIAGNOSIS — M6281 Muscle weakness (generalized): Secondary | ICD-10-CM | POA: Diagnosis not present

## 2018-12-13 ENCOUNTER — Telehealth (INDEPENDENT_AMBULATORY_CARE_PROVIDER_SITE_OTHER): Payer: PPO | Admitting: Nurse Practitioner

## 2018-12-13 ENCOUNTER — Other Ambulatory Visit: Payer: Self-pay

## 2018-12-13 ENCOUNTER — Encounter: Payer: Self-pay | Admitting: Nurse Practitioner

## 2018-12-13 VITALS — BP 108/55 | HR 60 | Ht 60.0 in | Wt 132.0 lb

## 2018-12-13 DIAGNOSIS — I4819 Other persistent atrial fibrillation: Secondary | ICD-10-CM

## 2018-12-13 DIAGNOSIS — I5032 Chronic diastolic (congestive) heart failure: Secondary | ICD-10-CM

## 2018-12-13 NOTE — Patient Instructions (Signed)
It was a pleasure to speak with you on the phone today! Thank you for allowing [place] to continue taking care of your [facility] needs during this time.   Feel free to call as needed for questions and concerns related to your cardiac needs.   Medication Instructions:  Your physician recommends that you continue on your current medications as directed. Please refer to the Current Medication list given to you today.  If you need a refill on your cardiac medications before your next appointment, please call your pharmacy.   Lab work: None ordered If you have labs (blood work) drawn today and your tests are completely normal, you will receive your results only by: Marland Kitchen MyChart Message (if you have MyChart) OR . A paper copy in the mail If you have any lab test that is abnormal or we need to change your treatment, we will call you to review the results.  Testing/Procedures: None ordered  Follow-Up: At [facility], you and your health needs are our priority.  As part of our continuing mission to provide you with exceptional heart care, we have created designated Provider Care Teams.  These Care Teams include your primary Cardiologist (physician) and Advanced Practice Providers (APPs -  Physician Assistants and Nurse Practitioners) who all work together to provide you with the care you need, when you need it. You will need a follow up e visit appointment in 1 months. You may see Kathlyn Sacramento, MD or Murray Hodgkins, NP.

## 2018-12-13 NOTE — Progress Notes (Signed)
Virtual Visit via Video Note   This visit type was conducted due to national recommendations for restrictions regarding the COVID-19 Pandemic (e.g. social distancing) in an effort to limit this patient's exposure and mitigate transmission in our community.  Due to her co-morbid illnesses, this patient is at least at moderate risk for complications without adequate follow up.  This format is felt to be most appropriate for this patient at this time.  All issues noted in this document were discussed and addressed.  A limited physical exam was performed with this format.  Please refer to the patient's chart for her consent to telehealth for [facility]. Evaluation Performed:  Follow-up visit  This visit type was conducted due to national recommendations for restrictions regarding the COVID-19 Pandemic (e.g. social distancing).  This format is felt to be most appropriate for this patient at this time.  All issues noted in this document were discussed and addressed.  No physical exam was performed (except for noted visual exam findings with Video Visits).  Please refer to the patient's chart (MyChart message for video visits and phone note for telephone visits) for the patient's consent to telehealth for [facility]. _____________   Date:  12/13/2018   Patient ID:  NATAYAH WARMACK, DOB 03/19/1929, MRN [id number] Patient Location:  SNF Provider location:   office  Primary Care Provider:  [name], MD Primary Cardiologist:  Kathlyn Sacramento, MD  Chief Complaint    85 year old female with a history of persistent atrial fibrillation, chronic diastolic congestive heart failure, stage IV chronic kidney disease, asthma/COPD, hypertension, hypothyroidism, anemia of chronic disease, and Barrett's esophagus, who presents for follow-up related to A. fib.  Past Medical History    Past Medical History:  Diagnosis Date   (HFpEF) heart failure with preserved ejection fraction ([place])    a. 08/2018  Echo: EF 55-60%, no rwma, Mild MR, mod dil LA. Nl RV fxn.   Asthma    Barrett esophagus    Chronic bronchitis    Chronic kidney disease (CKD), stage IV (severe) (HCC)    Depression    Diverticulosis    GERD (gastroesophageal reflux disease)    Gout    History of stress test    a. 04/2017 MV: prior inf/inflat infarct w/ peri-infarct ischemia. EF 55-65%. Sev hypotension w/ lexiscan.   Hyperlipidemia    Hypertension    Hypothyroid    Mitral regurgitation    a. TTE 02/2017: EF 55-60%, normal WM, calcified mitral annulus with mod regurg, moderately dilated LA; b. 08/2018 Echo: Mild MR.   Pancreatitis due to biliary obstruction March 2010   s/p ERCP sphincterotomy, cholecystectomy   Peripheral neuralgia    Persistent atrial fibrillation    a. diagnosed 01/24/17-> CHADS2VASc => 5 (HTN, age x 2, vascular disease with PAD and aortic plaque, female) giving her an estimated annual stroke risk of 6.7%->Eliquis; b. successful DCCV 03/28/2017; c. BB d/c'd 2/2 bradycardia; d. 08/2018 Recurrent Afib-> DCCV-> dilt started but later d/c'd 2/2 bradycardia.   Venous insufficiency    Past Surgical History:  Procedure Laterality Date   ABDOMINAL HYSTERECTOMY     CARDIOVERSION N/A 03/28/2017   Procedure: CARDIOVERSION;  Surgeon: Nelva Bush, MD;  Location: [facility] ORS;  Service: Cardiovascular;  Laterality: N/A;   CARDIOVERSION N/A 09/14/2018   Procedure: CARDIOVERSION;  Surgeon: Minna Merritts, MD;  Location: [facility] ORS;  Service: Cardiovascular;  Laterality: N/A;   CARPAL TUNNEL RELEASE  jan 2013   [name]   CATARACT EXTRACTION  right   CATARACT EXTRACTION  2008   CHOLECYSTECTOMY  02/10   CHOLECYSTECTOMY  2010   TONSILLECTOMY     VENTRAL HERNIA REPAIR     VENTRAL HERNIA REPAIR  2007   VESICOVAGINAL FISTULA CLOSURE W/ TAH      Allergies  Allergies  Allergen Reactions   Promethazine Anxiety, Palpitations and Other (See Comments)    IV Promethazine 6.25 mg.  JIttery, anxious, couldn't lay still, increase HR   Ace Inhibitors Cough   Benicar [Olmesartan Medoxomil]     Unsure of reaction type   Clarithromycin Other (See Comments)    Blisters in mouth   Norvasc [Amlodipine Besylate]    Levofloxacin Rash   Zithromax [Azithromycin Dihydrate] Rash    History of Present Illness    Savannah Wilson is a 85 y.o. female who presents via audio/video conferencing for a telehealth visit today.  This was carried out via a video based platform with the assistance of nursing home staff.  As outlined above, she has a history of persistent atrial fibrillation, chronic diastolic congestive heart failure, stage IV chronic kidney disease, asthma/COPD, hypertension, hypothyroidism, anemia of chronic disease, and Barrett's esophagus.  She previously required cardioversion for atrial fibrillation in August 2018.  Echocardiogram at that time showed moderate mitral regurgitation.  Stress testing was subsequently performed and showed inferior and inferolateral infarct with minimal peri-infarct ischemia.  She had severe hypotension with Lexiscan and has been medically managed for presumed underlying coronary disease.  She has been chronically anticoagulated with Eliquis and in the setting of bradycardia, beta-blocker was discontinued in July 2019.  In January 2020, she was admitted with recurrent atrial fibrillation, abdominal pain, and diarrhea.  Echocardiogram showed normal LV function with mild mitral regurgitation.  With recovery, rates stabilized and she was discharged off of AV nodal blocking agent but required readmission in February in the setting of recurrent dizziness, fatigue, diarrhea, and fall.  Due to persistently elevated heart rates, diltiazem was added but then subsequently discontinued and she did require repeat cardioversion.  She was readmitted in mid February following a mechanical fall and in the emergency department, she was found to be bradycardic with rates  in the low 40s.  She was admitted and diltiazem discontinued.  She briefly required intravenous dopamine.  She maintained sinus rhythm throughout hospitalization and was subsequently discharged.  At follow-up virtual visit with Dr. Fletcher Anon on April 3, heart rate and blood pressure were stable.  Since then, she has continued to do well from a cardiac standpoint.  She has had improvement in fatigue and strength and has been working with physical therapy.  She does sometimes experience dyspnea on exertion with physical therapy but is now able to walk using a walker and with some assistance.  She has not been having any chest pain, palpitations, PND, orthopnea, dizziness, syncope, or early satiety.  She sometimes notes mild hand and ankle swelling.  The patient does not have symptoms concerning for COVID-19 infection (fever, chills, cough, or new shortness of breath).   Home Medications    Prior to Admission medications   Medication Sig Start Date End Date Taking? Authorizing Provider  acetaminophen (TYLENOL) 650 MG CR tablet Take 650 mg by mouth every 8 (eight) hours as needed for pain.    Yes [provider]  apixaban (ELIQUIS) 2.5 MG TABS tablet Take 1 tablet (2.5 mg total) by mouth 2 (two) times daily. 02/12/18  Yes Wellington Hampshire, MD  bisacodyl (DULCOLAX) 5 MG EC tablet  Take 1 tablet (5 mg total) by mouth daily as needed for moderate constipation. 10/03/18  Yes Ojie, Jude, MD  budesonide (ENTOCORT EC) 3 MG 24 hr capsule Take 1 capsule (3 mg total) by mouth daily. 09/18/18  Yes [name], MD  budesonide (PULMICORT) 0.25 MG/2ML nebulizer solution USE ONE (1) VIAL IN NEBULIZER TWO TIMES PER DAY 08/21/18  Yes [name], MD  Cholecalciferol (VITAMIN D-3) 1000 units CAPS Take 1,000 Units by mouth daily.    Yes [provider]  docusate sodium (COLACE) 100 MG capsule Take 1 capsule (100 mg total) by mouth 2 (two) times daily as needed for mild constipation. 08/26/18  Yes Epifanio Lesches, MD  escitalopram (LEXAPRO) 20 MG tablet Take 20 mg by mouth daily.  11/06/18  Yes [provider]  esomeprazole (NEXIUM) 40 MG capsule Take 40 mg by mouth daily at 12 noon.   Yes [provider]  fluticasone (FLONASE) 50 MCG/ACT nasal spray Place 2 sprays into the nose daily. 01/30/13  Yes [name], MD  furosemide (LASIX) 40 MG tablet Take 40 mg by mouth daily.    Yes [provider]  ipratropium-albuterol (DUONEB) 0.5-2.5 (3) MG/3ML SOLN Take 3 mLs by nebulization every 6 (six) hours as needed. 08/27/18  Yes [name], MD  levothyroxine (SYNTHROID, LEVOTHROID) 112 MCG tablet Take 112 mcg by mouth daily. 10/27/18  Yes [provider]  losartan (COZAAR) 50 MG tablet TAKE ONE TABLET EVERY DAY 08/09/18  Yes [name], MD  ondansetron (ZOFRAN ODT) 4 MG disintegrating tablet Take 1 tablet (4 mg total) by mouth every 8 (eight) hours as needed for nausea or vomiting. 09/12/18  Yes Nena Polio, MD  potassium chloride (K-DUR,KLOR-CON) 10 MEQ tablet Take 1 tablet (10 mEq total) by mouth daily. 10/04/18  Yes Ojie, Jude, MD  PROAIR HFA 108 (90 BASE) MCG/ACT inhaler INHALE 2 PUFFS EVERY 6 HOURS AS NEEDED FOR WHEEZING 03/13/15  Yes [name], MD  rOPINIRole (REQUIP) 0.5 MG tablet Take 0.5 mg by mouth 3 (three) times daily.   Yes [provider]  Spacer/Aero-Holding Chambers [facility] ADVANTAGE) MISC 1 each by Other route once. Always uses her when you're using a metered-dose inhaler. You've aromatase medicine as much, he won't have his much side effect, but you it twice as much medicine and your lungs. 06/08/15  Yes Ahmed Prima, MD  temazepam (RESTORIL) 15 MG capsule TAKE 1 CAPSULE BY MOUTH AT BEDTIME AS NEEDED FOR SLEEP 06/13/18  Yes [name], MD  vitamin B-12 (CYANOCOBALAMIN) 1000 MCG tablet Take 1,000 mcg by mouth daily.   Yes [provider]    Review of Systems    Still with weakness and difficulty walking  but strength has improved some and she is now able to use a walker and ambulate with some assistance.  She has noted intermittent mild hand and ankle swelling though none currently.  She denies chest pain, palpitations, dyspnea, PND, orthopnea, dizziness, syncope, or early satiety.  All other systems reviewed and are otherwise negative except as noted above.  Physical Exam    Vital Signs:  BP (!) 108/55 (Patient Position: Sitting) Comment: 12/12/2018   Pulse 60 Comment: 12/12/2018   Ht 5' (1.524 m)    Wt 132 lb (59.9 kg) Comment: 10/24/2018   SpO2 97% Comment: 12/12/2018   BMI 25.78 kg/m    Video visit limits examination but she is well-appearing and in no acute distress.  Awake alert  and oriented x3.  HEENT is grossly normal with the exception of difficulty hearing.  Respirations are regular and unlabored.  Accessory Clinical Findings    Lab Results  Component Value Date   CREATININE 1.54 (H) 10/02/2018   BUN 32 (H) 10/02/2018   NA 135 10/02/2018   K 4.1 10/02/2018   CL 102 10/02/2018   CO2 25 10/02/2018   Lab Results  Component Value Date   WBC 6.7 10/02/2018   HGB 8.9 (L) 10/02/2018   HCT 29.1 (L) 10/02/2018   MCV 91.8 10/02/2018   PLT 256 10/02/2018     Assessment & Plan    1.  Persistent atrial fibrillation: Status post cardioversion February 2019 and based on stable heart rate in the 60s, it appears that she is maintaining sinus rhythm.  She has not been having any palpitations or heart failure symptoms.  She is anticoagulated with low-dose Eliquis in the setting of advanced age, elevated creatinine, and weight that hovers right at 60 kg.  She developed bradycardia on both beta-blocker and calcium channel blocker therapy.  We will continue to consider initiation of amiodarone therapy as she is likely at high risk for recurrent atrial fibrillation in the future.  2.  HFpEF: Normal LV function by echo in January.  Weight has been stable and she has not been experiencing any significant  dyspnea.  Occasional mild ankle edema which is likely secondary to sitting for large portions of the day.  Heart rate and blood pressure stable.  Continue losartan and Lasix therapy.  3.  Stage IV chronic kidney disease: Last creatinine was 1.54 in February.  4.  Presumed underlying coronary artery disease: Abnormal stress testing in 2018.  She has been conservatively managed.  No chest pain.  No aspirin in the setting of Eliquis.  LDL is 45 without statin therapy.  5.  Disposition: Follow-up video visit in 1 month.  Though she speaks with her daughter daily, she has not seen her in 2 months.  We will look to perform a group call involving her daughter at that time.  COVID-19 Education: The signs and symptoms of COVID-19 were discussed with the patient and how to seek care for testing (follow up with PCP or arrange E-visit).  The importance of social distancing was discussed today.  Patient Risk:   After full review of this patient's history and clinical status, I feel that he is at least moderate risk for cardiac complications at this time, thus necessitating a telehealth visit sooner than our first available in office visit.  Time:   Today, I have spent 15 minutes with the patient with telehealth technology discussing symptoms and management of atrial fibrillation and heart failure.    Murray Hodgkins, NP 12/13/2018, 1:57 PM

## 2018-12-19 ENCOUNTER — Other Ambulatory Visit: Payer: Self-pay | Admitting: Internal Medicine

## 2018-12-26 DIAGNOSIS — N390 Urinary tract infection, site not specified: Secondary | ICD-10-CM | POA: Diagnosis not present

## 2018-12-26 DIAGNOSIS — R319 Hematuria, unspecified: Secondary | ICD-10-CM | POA: Diagnosis not present

## 2018-12-26 DIAGNOSIS — Z79899 Other long term (current) drug therapy: Secondary | ICD-10-CM | POA: Diagnosis not present

## 2018-12-28 DIAGNOSIS — D649 Anemia, unspecified: Secondary | ICD-10-CM | POA: Diagnosis not present

## 2018-12-28 DIAGNOSIS — D631 Anemia in chronic kidney disease: Secondary | ICD-10-CM | POA: Diagnosis not present

## 2018-12-28 DIAGNOSIS — I130 Hypertensive heart and chronic kidney disease with heart failure and stage 1 through stage 4 chronic kidney disease, or unspecified chronic kidney disease: Secondary | ICD-10-CM | POA: Diagnosis not present

## 2019-01-05 DIAGNOSIS — D649 Anemia, unspecified: Secondary | ICD-10-CM | POA: Diagnosis not present

## 2019-01-05 DIAGNOSIS — I130 Hypertensive heart and chronic kidney disease with heart failure and stage 1 through stage 4 chronic kidney disease, or unspecified chronic kidney disease: Secondary | ICD-10-CM | POA: Diagnosis not present

## 2019-01-07 DIAGNOSIS — M6281 Muscle weakness (generalized): Secondary | ICD-10-CM | POA: Diagnosis not present

## 2019-01-07 DIAGNOSIS — R262 Difficulty in walking, not elsewhere classified: Secondary | ICD-10-CM | POA: Diagnosis not present

## 2019-01-09 DIAGNOSIS — D649 Anemia, unspecified: Secondary | ICD-10-CM | POA: Diagnosis not present

## 2019-01-09 DIAGNOSIS — Z79899 Other long term (current) drug therapy: Secondary | ICD-10-CM | POA: Diagnosis not present

## 2019-01-10 DIAGNOSIS — F419 Anxiety disorder, unspecified: Secondary | ICD-10-CM | POA: Diagnosis not present

## 2019-01-10 DIAGNOSIS — G4700 Insomnia, unspecified: Secondary | ICD-10-CM | POA: Diagnosis not present

## 2019-01-10 DIAGNOSIS — F339 Major depressive disorder, recurrent, unspecified: Secondary | ICD-10-CM | POA: Diagnosis not present

## 2019-01-14 DIAGNOSIS — M25551 Pain in right hip: Secondary | ICD-10-CM | POA: Diagnosis not present

## 2019-01-14 DIAGNOSIS — M25561 Pain in right knee: Secondary | ICD-10-CM | POA: Diagnosis not present

## 2019-01-17 DIAGNOSIS — N184 Chronic kidney disease, stage 4 (severe): Secondary | ICD-10-CM | POA: Diagnosis not present

## 2019-01-17 DIAGNOSIS — I130 Hypertensive heart and chronic kidney disease with heart failure and stage 1 through stage 4 chronic kidney disease, or unspecified chronic kidney disease: Secondary | ICD-10-CM | POA: Diagnosis not present

## 2019-01-17 DIAGNOSIS — J449 Chronic obstructive pulmonary disease, unspecified: Secondary | ICD-10-CM | POA: Diagnosis not present

## 2019-01-17 DIAGNOSIS — W19XXXD Unspecified fall, subsequent encounter: Secondary | ICD-10-CM | POA: Diagnosis not present

## 2019-01-17 DIAGNOSIS — S022XXD Fracture of nasal bones, subsequent encounter for fracture with routine healing: Secondary | ICD-10-CM | POA: Diagnosis not present

## 2019-01-17 DIAGNOSIS — S12401D Unspecified nondisplaced fracture of fifth cervical vertebra, subsequent encounter for fracture with routine healing: Secondary | ICD-10-CM | POA: Diagnosis not present

## 2019-01-17 DIAGNOSIS — I4891 Unspecified atrial fibrillation: Secondary | ICD-10-CM | POA: Diagnosis not present

## 2019-01-17 DIAGNOSIS — S12501D Unspecified nondisplaced fracture of sixth cervical vertebra, subsequent encounter for fracture with routine healing: Secondary | ICD-10-CM | POA: Diagnosis not present

## 2019-01-17 DIAGNOSIS — I5032 Chronic diastolic (congestive) heart failure: Secondary | ICD-10-CM | POA: Diagnosis not present

## 2019-01-17 DIAGNOSIS — I509 Heart failure, unspecified: Secondary | ICD-10-CM | POA: Diagnosis not present

## 2019-01-17 DIAGNOSIS — Z789 Other specified health status: Secondary | ICD-10-CM | POA: Diagnosis not present

## 2019-02-08 DIAGNOSIS — Z20828 Contact with and (suspected) exposure to other viral communicable diseases: Secondary | ICD-10-CM | POA: Diagnosis not present

## 2019-03-09 DEATH — deceased

## 2019-04-14 IMAGING — CT CT HEAD W/O CM
3 series · 15 of 45 positions shown, 18 images · non-contrast
Comparison: Head CT 09/26/2018 and 09/11/2018.

CLINICAL DATA: Right side weakness today. The patient suffered a
fall a few days ago. Initial encounter.

EXAM:
CT HEAD WITHOUT CONTRAST
TECHNIQUE: Contiguous axial images were obtained from the base of the skull
through the vertex without intravenous contrast.

[Series 2: head wo · axial · 0.47mm/px · z∈[-145,-30]mm · 9 of 28 slices shown, 12 images]
[im 3/28  brain]
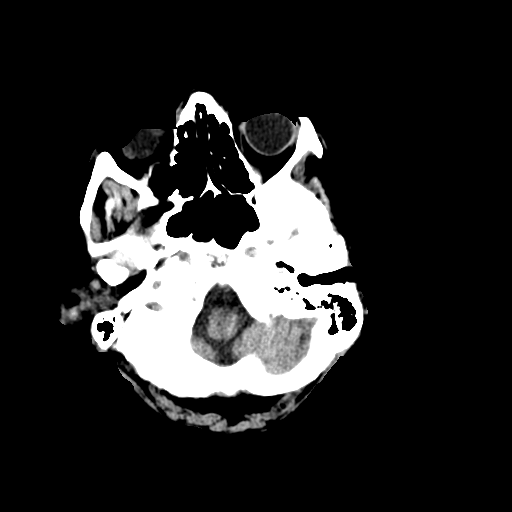
[im 3/28  bone]
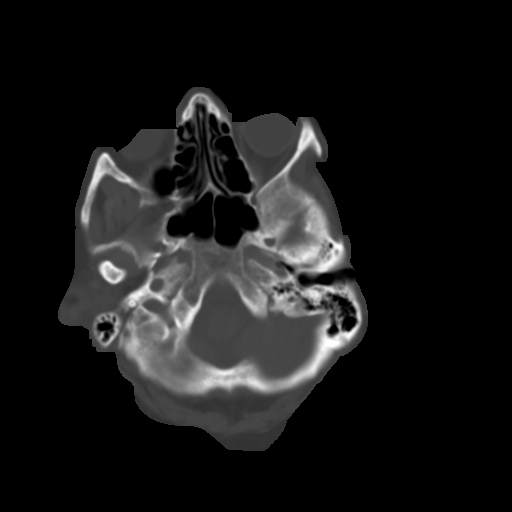
[im 6/28  brain]
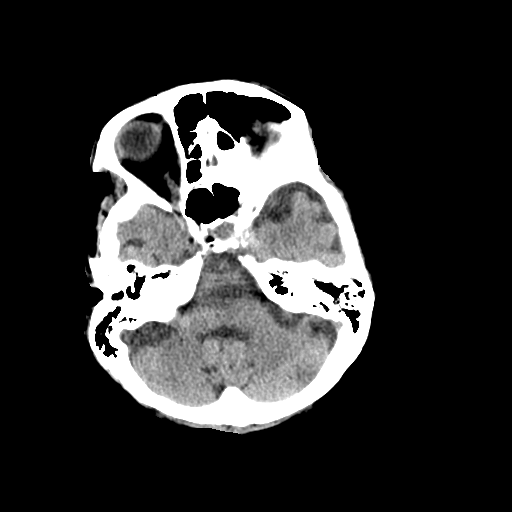
[im 9/28  brain]
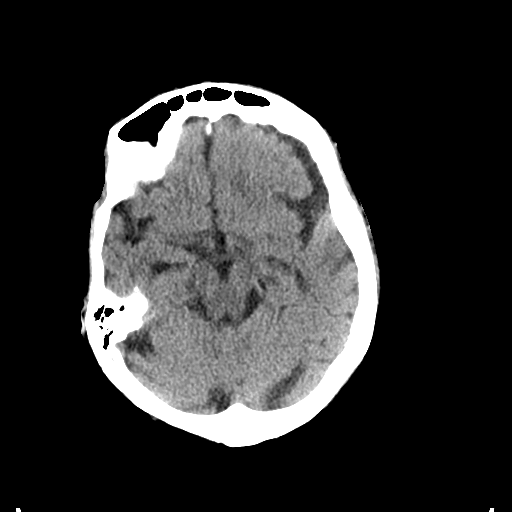
[im 12/28  brain]
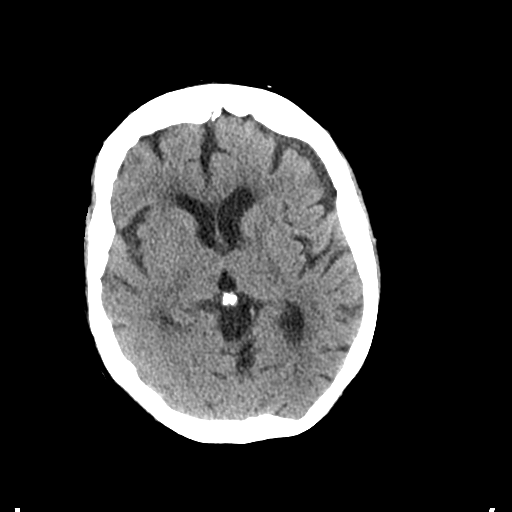
[im 15/28  brain]
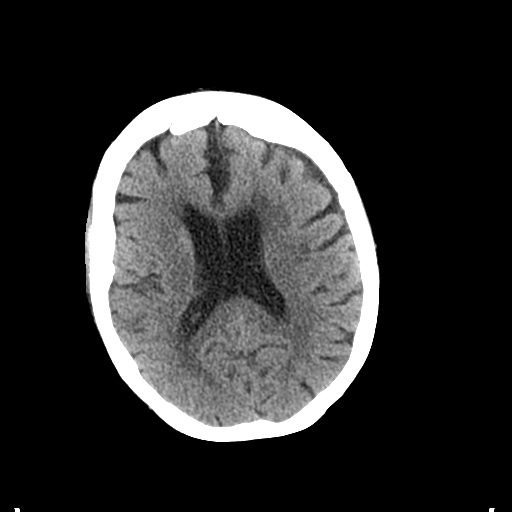
[im 15/28  bone]
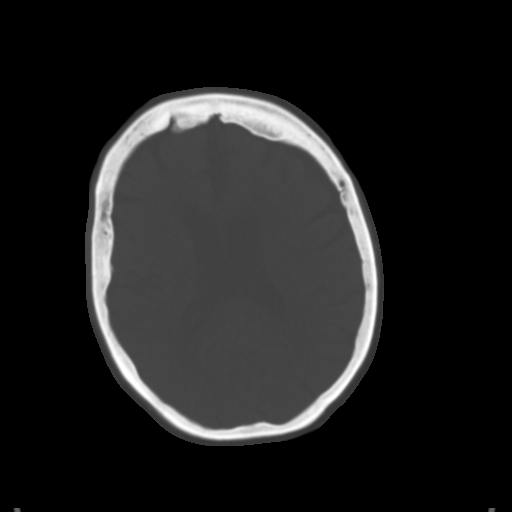
[im 17/28  brain]
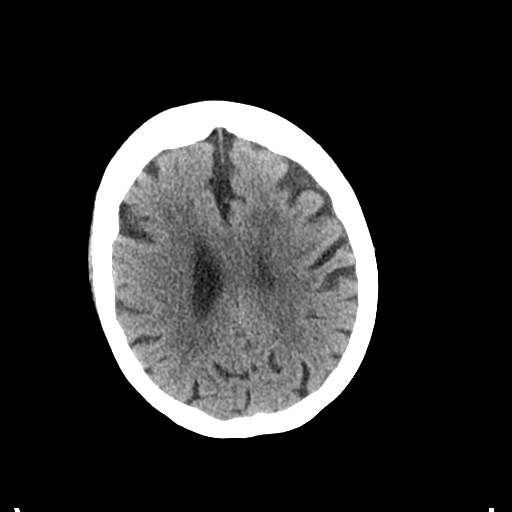
[im 20/28  brain]
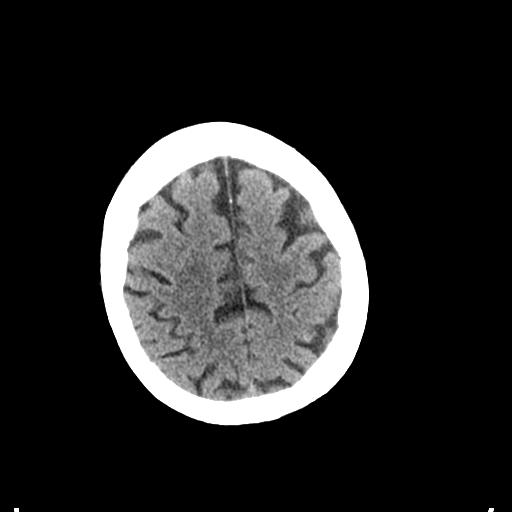
[im 23/28  brain]
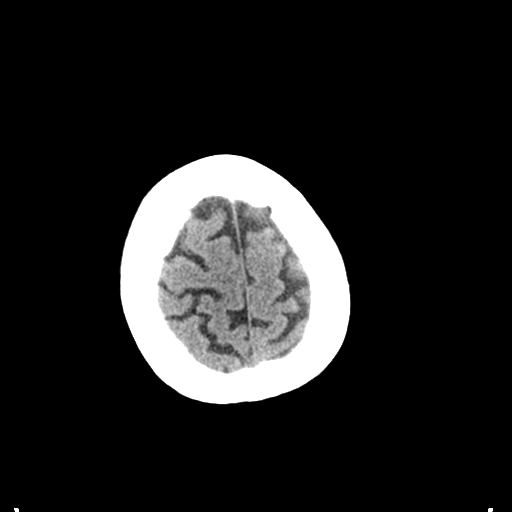
[im 26/28  brain]
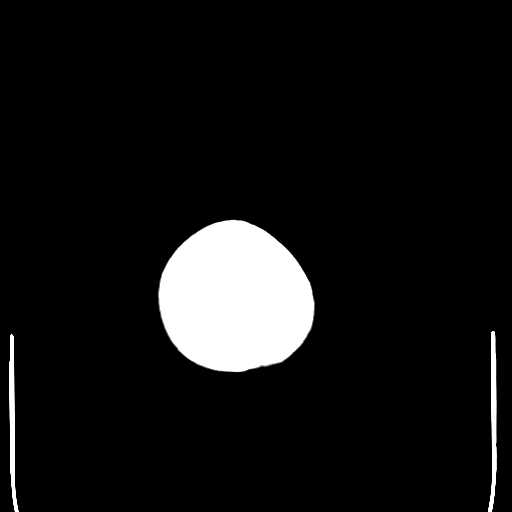
[im 26/28  bone]
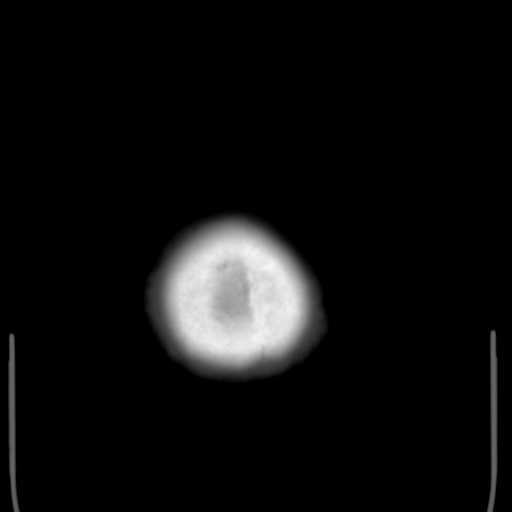

[Series 4: coronal soft tissue · coronal · 0.28mm/px · 3 of 61 slices shown]
[im 21/61  brain]
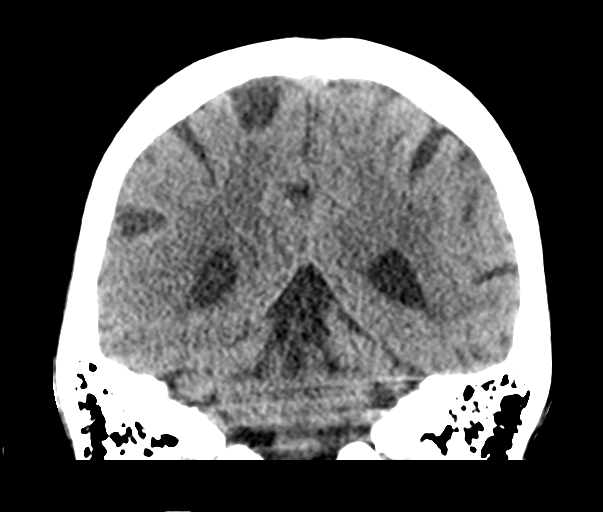
[im 27/61  brain]
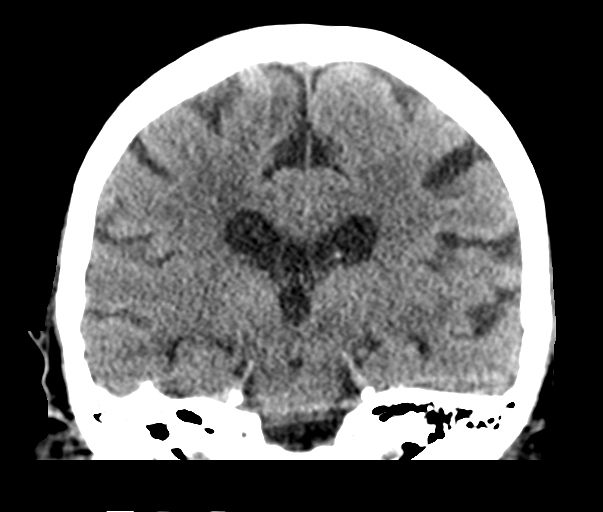
[im 34/61  brain]
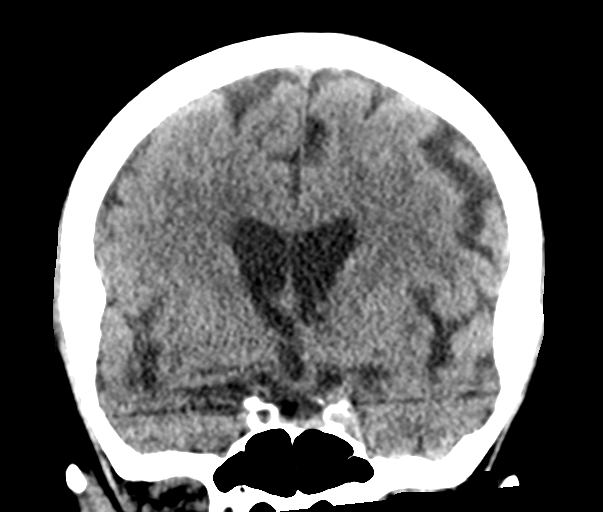

[Series 5: sagittal soft tissue · sagittal · 0.28mm/px · 3 of 53 slices shown]
[im 18/53  brain]
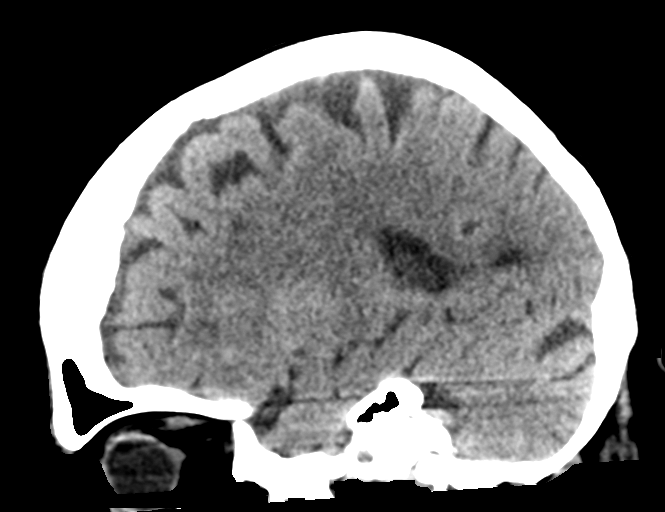
[im 27/53  brain]
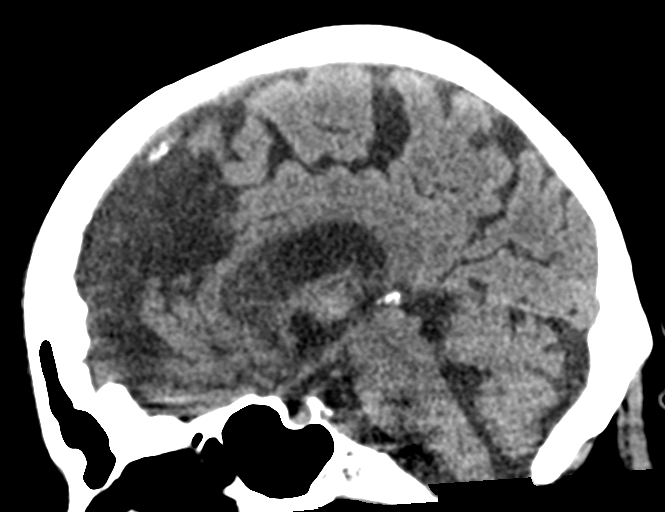
[im 35/53  brain]
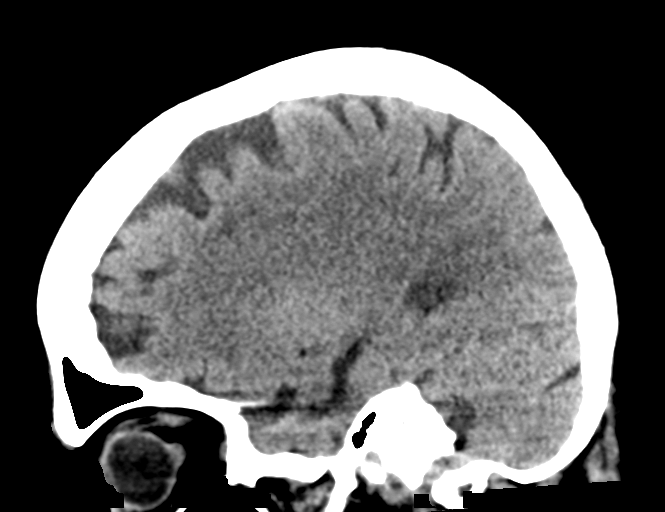

[15 of 45 positions shown; findings below may reference images not displayed]

FINDINGS: Brain: No evidence of acute infarction, hemorrhage, hydrocephalus,
extra-axial collection or mass lesion/mass effect. Mild atrophy and
chronic microvascular ischemic change noted.

Vascular: No hyperdense vessel or unexpected calcification.

Skull: Intact.  No focal lesion.

Sinuses/Orbits: Negative.

Other: None.
IMPRESSION: No acute abnormality.

Mild atrophy and chronic microvascular ischemic change.

## 2019-05-08 ENCOUNTER — Ambulatory Visit: Payer: PPO

## 2019-05-08 ENCOUNTER — Ambulatory Visit: Payer: PPO | Admitting: Internal Medicine
